# Patient Record
Sex: Female | Born: 1950 | Hispanic: Yes | Marital: Married | Smoking: Never smoker
Health system: Southern US, Community
[De-identification: ages and names within clinical notes are randomized; demographics above are authoritative.]

## PROBLEM LIST (undated history)

## (undated) DIAGNOSIS — E119 Type 2 diabetes mellitus without complications: Secondary | ICD-10-CM

## (undated) DIAGNOSIS — N186 End stage renal disease: Secondary | ICD-10-CM

## (undated) DIAGNOSIS — Z950 Presence of cardiac pacemaker: Secondary | ICD-10-CM

## (undated) DIAGNOSIS — I480 Paroxysmal atrial fibrillation: Secondary | ICD-10-CM

## (undated) DIAGNOSIS — I10 Essential (primary) hypertension: Secondary | ICD-10-CM

## (undated) DIAGNOSIS — H547 Unspecified visual loss: Secondary | ICD-10-CM

## (undated) DIAGNOSIS — D649 Anemia, unspecified: Secondary | ICD-10-CM

## (undated) DIAGNOSIS — Z992 Dependence on renal dialysis: Secondary | ICD-10-CM

## (undated) DIAGNOSIS — K7460 Unspecified cirrhosis of liver: Secondary | ICD-10-CM

## (undated) HISTORY — PX: COLONOSCOPY: SHX174

## (undated) HISTORY — DX: Anemia, unspecified: D64.9

## (undated) HISTORY — DX: Unspecified cirrhosis of liver: K74.60

## (undated) HISTORY — DX: Type 2 diabetes mellitus without complications: E11.9

## (undated) HISTORY — PX: UPPER GASTROINTESTINAL ENDOSCOPY: SHX188

## (undated) HISTORY — DX: Unspecified visual loss: H54.7

## (undated) HISTORY — PX: BACK SURGERY: SHX140

## (undated) HISTORY — DX: Dependence on renal dialysis: Z99.2

## (undated) HISTORY — DX: End stage renal disease: N18.6

---

## 2021-01-25 ENCOUNTER — Emergency Department (HOSPITAL_COMMUNITY)
Admission: EM | Admit: 2021-01-25 | Discharge: 2021-01-26 | Disposition: A | Discharge status: Home / Self Care | Payer: Medicare Other | Attending: Emergency Medicine | Admitting: Emergency Medicine

## 2021-01-25 ENCOUNTER — Encounter (HOSPITAL_COMMUNITY): Payer: Self-pay

## 2021-01-25 ENCOUNTER — Other Ambulatory Visit: Payer: Self-pay

## 2021-01-25 DIAGNOSIS — I10 Essential (primary) hypertension: Secondary | ICD-10-CM | POA: Diagnosis not present

## 2021-01-25 DIAGNOSIS — R110 Nausea: Secondary | ICD-10-CM | POA: Insufficient documentation

## 2021-01-25 DIAGNOSIS — E119 Type 2 diabetes mellitus without complications: Secondary | ICD-10-CM | POA: Insufficient documentation

## 2021-01-25 DIAGNOSIS — R1013 Epigastric pain: Secondary | ICD-10-CM | POA: Insufficient documentation

## 2021-01-25 DIAGNOSIS — Z5321 Procedure and treatment not carried out due to patient leaving prior to being seen by health care provider: Secondary | ICD-10-CM | POA: Insufficient documentation

## 2021-01-25 HISTORY — DX: Type 2 diabetes mellitus without complications: E11.9

## 2021-01-25 HISTORY — DX: Essential (primary) hypertension: I10

## 2021-01-25 LAB — COMPREHENSIVE METABOLIC PANEL
ALT: 28 U/L (ref 0–44)
AST: 70 U/L — ABNORMAL HIGH (ref 15–41)
Albumin: 3.1 g/dL — ABNORMAL LOW (ref 3.5–5.0)
Alkaline Phosphatase: 214 U/L — ABNORMAL HIGH (ref 38–126)
Anion gap: 11 (ref 5–15)
BUN: 25 mg/dL — ABNORMAL HIGH (ref 8–23)
CO2: 25 mmol/L (ref 22–32)
Calcium: 9.4 mg/dL (ref 8.9–10.3)
Chloride: 105 mmol/L (ref 98–111)
Creatinine, Ser: 1.41 mg/dL — ABNORMAL HIGH (ref 0.44–1.00)
GFR, Estimated: 41 mL/min — ABNORMAL LOW (ref >60)
Glucose, Bld: 181 mg/dL — ABNORMAL HIGH (ref 70–99)
Potassium: 4.5 mmol/L (ref 3.5–5.1)
Sodium: 141 mmol/L (ref 135–145)
Total Bilirubin: 1.6 mg/dL — ABNORMAL HIGH (ref 0.3–1.2)
Total Protein: 8.7 g/dL — ABNORMAL HIGH (ref 6.5–8.1)

## 2021-01-25 LAB — LIPASE, BLOOD: Lipase: 57 U/L — ABNORMAL HIGH (ref 11–51)

## 2021-01-25 LAB — CBC WITH DIFFERENTIAL/PLATELET
Abs Immature Granulocytes: 0.04 10*3/uL (ref 0.00–0.07)
Basophils Absolute: 0 10*3/uL (ref 0.0–0.1)
Basophils Relative: 1 %
Eosinophils Absolute: 0.1 10*3/uL (ref 0.0–0.5)
Eosinophils Relative: 2 %
HCT: 39.2 % (ref 36.0–46.0)
Hemoglobin: 12.8 g/dL (ref 12.0–15.0)
Immature Granulocytes: 1 %
Lymphocytes Relative: 18 %
Lymphs Abs: 1.1 10*3/uL (ref 0.7–4.0)
MCH: 32.7 pg (ref 26.0–34.0)
MCHC: 32.7 g/dL (ref 30.0–36.0)
MCV: 100.3 fL — ABNORMAL HIGH (ref 80.0–100.0)
Monocytes Absolute: 0.6 10*3/uL (ref 0.1–1.0)
Monocytes Relative: 10 %
Neutro Abs: 4.2 10*3/uL (ref 1.7–7.7)
Neutrophils Relative %: 68 %
Platelets: UNDETERMINED 10*3/uL (ref 150–400)
RBC: 3.91 MIL/uL (ref 3.87–5.11)
RDW: 13.6 % (ref 11.5–15.5)
WBC: 6 10*3/uL (ref 4.0–10.5)
nRBC: 0 % (ref 0.0–0.2)

## 2021-01-25 LAB — CBG MONITORING, ED: Glucose-Capillary: 215 mg/dL — ABNORMAL HIGH (ref 70–99)

## 2021-01-25 NOTE — ED Triage Notes (Signed)
Pt to er, computer interpreter used, pa at bedside, pt states that she is here because she was just recently dx with dm, and here for htn, also here for some epigastric pain that she has had for the past 5 days.

## 2021-01-25 NOTE — ED Provider Notes (Signed)
Emergency Medicine Provider Triage Evaluation Note  Stacey Chambers , a 70 y.o. female  was evaluated in triage.  Pt complains of presents with high blood pressure, diabetes, and epigastric pain.  Patient dates she saw her primary care doctor today, she was newly diagnosed with hypertension as well as diabetes, she was started on amlodipine, glipizide, she had not started them today yet.  She also notes that she is having epigastric pain, nausea going for last 5 days, has associated nausea and vomiting, no abdominal history, she denies any alleviating factors.  [name] interpreter was used to collect HPI.  Review of Systems  Positive: Epigastric pain, nausea Negative: Chest pain, shortness of breath  Physical Exam  BP (!) 197/95 (BP Location: Right Arm)   Pulse 97   Temp 98.4 F (36.9 C) (Oral)   Resp 16   SpO2 96%  Gen:   Awake, no distress   Resp:  Normal effort  MSK:   Moves extremities without difficulty  Other:  No facial asymmetry, no difficulty word finding, no slurring of the words, no unilateral weakness,  Medical Decision Making  Medically screening exam initiated at 3:10 PM.  Appropriate orders placed.  [name] was informed that the remainder of the evaluation will be completed by another provider, this initial triage assessment does not replace that evaluation, and the importance of remaining in the ED until their evaluation is complete.  Presents with high blood pressure, diabetes, epigastric pain patiently further work-up in the emergency department.   Marcello Fennel, PA-C 01/25/21 1512    Malvin Johns, MD 01/25/21 1515

## 2021-01-26 ENCOUNTER — Telehealth: Payer: Self-pay | Admitting: Gastroenterology

## 2021-01-26 ENCOUNTER — Encounter: Payer: Self-pay | Admitting: Gastroenterology

## 2021-01-26 NOTE — Telephone Encounter (Signed)
Hi Dr. Rush Landmark, we have received a referral from patient's PCP for worsening Gerd and epigastric pain. Patient was seen at [facility] last August 2021 and had EGD, colon and other exams performed. Records are available in Epic. I will send you records sent with referral. Could you please review them and advise on scheduling? Thank you.

## 2021-01-26 NOTE — Telephone Encounter (Signed)
Further workup/evaluation will be required by PCP in interim. May schedule in 4-8 weeks but do not overbook. If imaging is performed and more concerning then can see if anything else needs to be done sooner, but that is deferred to PCP in the interim. Thanks. GM

## 2021-01-27 ENCOUNTER — Other Ambulatory Visit: Payer: Self-pay | Admitting: Physician Assistant

## 2021-01-27 DIAGNOSIS — Z1231 Encounter for screening mammogram for malignant neoplasm of breast: Secondary | ICD-10-CM

## 2021-01-27 NOTE — Telephone Encounter (Signed)
Pt scheduled for consult on 03/11/21 at 10:30am. PCP has been informed of Dr. Donneta Romberg recommendations.

## 2021-01-31 DIAGNOSIS — R1013 Epigastric pain: Secondary | ICD-10-CM | POA: Diagnosis not present

## 2021-01-31 DIAGNOSIS — R7989 Other specified abnormal findings of blood chemistry: Secondary | ICD-10-CM | POA: Diagnosis not present

## 2021-01-31 DIAGNOSIS — E1165 Type 2 diabetes mellitus with hyperglycemia: Secondary | ICD-10-CM | POA: Diagnosis not present

## 2021-01-31 DIAGNOSIS — R3129 Other microscopic hematuria: Secondary | ICD-10-CM | POA: Diagnosis not present

## 2021-01-31 DIAGNOSIS — N289 Disorder of kidney and ureter, unspecified: Secondary | ICD-10-CM | POA: Diagnosis not present

## 2021-01-31 DIAGNOSIS — Z0001 Encounter for general adult medical examination with abnormal findings: Secondary | ICD-10-CM | POA: Diagnosis not present

## 2021-01-31 DIAGNOSIS — E782 Mixed hyperlipidemia: Secondary | ICD-10-CM | POA: Diagnosis not present

## 2021-01-31 DIAGNOSIS — R771 Abnormality of globulin: Secondary | ICD-10-CM | POA: Diagnosis not present

## 2021-01-31 DIAGNOSIS — D696 Thrombocytopenia, unspecified: Secondary | ICD-10-CM | POA: Diagnosis not present

## 2021-01-31 DIAGNOSIS — I10 Essential (primary) hypertension: Secondary | ICD-10-CM | POA: Diagnosis not present

## 2021-02-02 ENCOUNTER — Telehealth: Payer: Self-pay | Admitting: Hematology and Oncology

## 2021-02-02 NOTE — Telephone Encounter (Signed)
Received a new hem referral from [facility] for thrombocytopenia. Stacey Chambers has been scheduled to see Dr. Chryl Heck on 8/22 at 11:20am. Letter mailed to the pt. Referring office notified to call the pt w/the appt date and time.

## 2021-02-21 ENCOUNTER — Inpatient Hospital Stay: Payer: Medicare Other

## 2021-02-21 ENCOUNTER — Inpatient Hospital Stay: Payer: Medicare Other | Attending: Hematology and Oncology | Admitting: Hematology and Oncology

## 2021-02-28 DIAGNOSIS — D696 Thrombocytopenia, unspecified: Secondary | ICD-10-CM | POA: Diagnosis not present

## 2021-02-28 DIAGNOSIS — E782 Mixed hyperlipidemia: Secondary | ICD-10-CM | POA: Diagnosis not present

## 2021-02-28 DIAGNOSIS — M62838 Other muscle spasm: Secondary | ICD-10-CM | POA: Diagnosis not present

## 2021-02-28 DIAGNOSIS — R7989 Other specified abnormal findings of blood chemistry: Secondary | ICD-10-CM | POA: Diagnosis not present

## 2021-02-28 DIAGNOSIS — N289 Disorder of kidney and ureter, unspecified: Secondary | ICD-10-CM | POA: Diagnosis not present

## 2021-02-28 DIAGNOSIS — E1165 Type 2 diabetes mellitus with hyperglycemia: Secondary | ICD-10-CM | POA: Diagnosis not present

## 2021-02-28 DIAGNOSIS — R3129 Other microscopic hematuria: Secondary | ICD-10-CM | POA: Diagnosis not present

## 2021-02-28 DIAGNOSIS — R771 Abnormality of globulin: Secondary | ICD-10-CM | POA: Diagnosis not present

## 2021-02-28 DIAGNOSIS — I10 Essential (primary) hypertension: Secondary | ICD-10-CM | POA: Diagnosis not present

## 2021-02-28 DIAGNOSIS — R1013 Epigastric pain: Secondary | ICD-10-CM | POA: Diagnosis not present

## 2021-03-10 DIAGNOSIS — H1712 Central corneal opacity, left eye: Secondary | ICD-10-CM | POA: Diagnosis not present

## 2021-03-11 ENCOUNTER — Ambulatory Visit (INDEPENDENT_AMBULATORY_CARE_PROVIDER_SITE_OTHER): Payer: Medicare Other | Admitting: Gastroenterology

## 2021-03-11 ENCOUNTER — Encounter: Payer: Self-pay | Admitting: Gastroenterology

## 2021-03-11 ENCOUNTER — Other Ambulatory Visit (INDEPENDENT_AMBULATORY_CARE_PROVIDER_SITE_OTHER): Payer: Medicare Other

## 2021-03-11 VITALS — BP 174/70 | HR 84 | Ht <= 58 in | Wt 149.2 lb

## 2021-03-11 DIAGNOSIS — R748 Abnormal levels of other serum enzymes: Secondary | ICD-10-CM

## 2021-03-11 DIAGNOSIS — D7589 Other specified diseases of blood and blood-forming organs: Secondary | ICD-10-CM

## 2021-03-11 DIAGNOSIS — R7989 Other specified abnormal findings of blood chemistry: Secondary | ICD-10-CM | POA: Diagnosis not present

## 2021-03-11 DIAGNOSIS — Z862 Personal history of diseases of the blood and blood-forming organs and certain disorders involving the immune mechanism: Secondary | ICD-10-CM

## 2021-03-11 DIAGNOSIS — Z8711 Personal history of peptic ulcer disease: Secondary | ICD-10-CM | POA: Diagnosis not present

## 2021-03-11 DIAGNOSIS — R1013 Epigastric pain: Secondary | ICD-10-CM | POA: Diagnosis not present

## 2021-03-11 LAB — COMPREHENSIVE METABOLIC PANEL
ALT: 28 U/L (ref 0–35)
AST: 65 U/L — ABNORMAL HIGH (ref 0–37)
Albumin: 3.3 g/dL — ABNORMAL LOW (ref 3.5–5.2)
Alkaline Phosphatase: 175 U/L — ABNORMAL HIGH (ref 39–117)
BUN: 35 mg/dL — ABNORMAL HIGH (ref 6–23)
CO2: 23 mEq/L (ref 19–32)
Calcium: 9.1 mg/dL (ref 8.4–10.5)
Chloride: 105 mEq/L (ref 96–112)
Creatinine, Ser: 1.69 mg/dL — ABNORMAL HIGH (ref 0.40–1.20)
GFR: 31 mL/min — ABNORMAL LOW (ref >60.00)
Glucose, Bld: 135 mg/dL — ABNORMAL HIGH (ref 70–99)
Potassium: 4.7 mEq/L (ref 3.5–5.1)
Sodium: 136 mEq/L (ref 135–145)
Total Bilirubin: 0.6 mg/dL (ref 0.2–1.2)
Total Protein: 8.4 g/dL — ABNORMAL HIGH (ref 6.0–8.3)

## 2021-03-11 LAB — IBC + FERRITIN
Ferritin: 23.1 ng/mL (ref 10.0–291.0)
Iron: 61 ug/dL (ref 42–145)
Saturation Ratios: 13.5 % — ABNORMAL LOW (ref 20.0–50.0)
TIBC: 450.8 ug/dL — ABNORMAL HIGH (ref 250.0–450.0)
Transferrin: 322 mg/dL (ref 212.0–360.0)

## 2021-03-11 LAB — CBC
HCT: 37.5 % (ref 36.0–46.0)
Hemoglobin: 12.5 g/dL (ref 12.0–15.0)
MCHC: 33.3 g/dL (ref 30.0–36.0)
MCV: 98.2 fl (ref 78.0–100.0)
Platelets: 67 10*3/uL — ABNORMAL LOW (ref 150.0–400.0)
RBC: 3.82 Mil/uL — ABNORMAL LOW (ref 3.87–5.11)
RDW: 13.4 % (ref 11.5–15.5)
WBC: 6 10*3/uL (ref 4.0–10.5)

## 2021-03-11 LAB — B12 AND FOLATE PANEL
Folate: >24.4 ng/mL (ref >5.9)
Vitamin B-12: 924 pg/mL — ABNORMAL HIGH (ref 211–911)

## 2021-03-11 LAB — LIPASE: Lipase: 122 U/L — ABNORMAL HIGH (ref 11.0–59.0)

## 2021-03-11 MED ORDER — ESOMEPRAZOLE MAGNESIUM 40 MG PO CPDR: 40.0000 mg | DELAYED_RELEASE_CAPSULE | Freq: Every day | ORAL | 2 refills | Status: DC

## 2021-03-11 NOTE — Patient Instructions (Addendum)
Su proveedor le ha pedido que vaya al nivel del stano para el trabajo de laboratorio antes de salir hoy. Presione "B" en el ascensor. El laboratorio est ubicado en la primera puerta a la izquierda al salir del Materials engineer.  Le han programado una endoscopia. Siga las instrucciones escritas que se le dieron en su visita de hoy. Si [place] inhaladores (aunque solo sea necesario), trigalos el da de su procedimiento.  Hemos enviado los siguientes medicamentos a su farmacia para que los recoja a su conveniencia: Nexium: tome 1 cpsula por va oral una vez al [employer].  Se le ha programado una ecografa abdominal en [facility] (primer piso del hospital) el 03/25/21 a las 9:30am. Llegue 15 minutos antes de su cita para registrarse. Asegrese de no comer ni beber nada 6 horas antes de su cita. Si necesita reprogramar su cita, comunquese con radiologa al [phone]. Esta prueba suele tardar unos 30 minutos en realizarse.  Debido a los cambios recientes en las leyes de atencin mdica, es posible que vea los [place] de sus estudios de imgenes y de laboratorio en MyChart antes de que su proveedor haya tenido la oportunidad de revisarlos. Entendemos que en algunos casos puede haber resultados confusos o preocupantes para usted. No todos los resultados de laboratorio regresan en el mismo perodo de tiempo y el proveedor puede estar esperando mltiples resultados para Primary school teacher. Denos 21 horas para que su proveedor revise minuciosamente todos los resultados antes de comunicarse con la oficina para Audiological scientist.  Si tiene 65 aos o ms, su ndice de [employer] corporal debe estar entre 23 y 7. Su ndice de masa corporal es de 31,18 kg/m. Si esto est fuera del rango mencionado anteriormente, considere hacer un seguimiento con su proveedor de Midwife.  Los proveedores de Financial controller GI desean alentarlo a que use MYCHART para comunicarse con los proveedores para solicitudes o preguntas que  no sean urgentes. Debido a los Astronomer de espera en el telfono, enviar un mensaje a su proveedor por [employer] puede ser una forma ms rpida y eficiente de obtener una respuesta. Espere 48 horas hbiles para obtener [employer]. Recuerde que esto es para solicitudes no urgentes.  Gracias por elegirnos a m Com[facility] Gastro[facility].  ________________________________________________________________   Your provider has requested that you go to the basement level for lab work before leaving today. Press "B" on the elevator. The lab is located at the first door on the left as you exit the elevator.  You have been scheduled for an endoscopy. Please follow written instructions given to you at your visit today. If you use inhalers (even only as needed), please bring them with you on the day of your procedure.  We have sent the following medications to your pharmacy for you to pick up at your convenience: Nexium - Take 1 capsule by mouth once daily.   You have been scheduled for an abdominal ultrasound at [facility] (1st floor of hospital) on 03/25/21 at 9:30am. Please arrive 15 minutes prior to your appointment for registration. Make certain not to have anything to eat or drink 6 hours prior to your appointment. Should you need to reschedule your appointment, please contact radiology at [phone]. This test typically takes about 30 minutes to perform.  Due to recent changes in healthcare laws, you may see the results of your imaging and laboratory studies on MyChart before your provider has had a chance to review them.  We understand that in some cases there  may be results that are confusing or concerning to you. Not all laboratory results come back in the same time frame and the provider may be waiting for multiple results in order to interpret others.  Please give [place] 48 hours in order for your provider to thoroughly review all the results before contacting the office for  clarification of your results.   If you are age 73 or older, your body mass index should be between 23-30. Your Body mass index is 31.18 kg/m. If this is out of the aforementioned range listed, please consider follow up with your Primary Care Provider.  The [facility] providers would like to encourage you to use [facility] to communicate with providers for non-urgent requests or questions.  Due to long hold times on the telephone, sending your provider a message by [facility] may be a faster and more efficient way to get a response.  Please allow 48 business hours for a response.  Please remember that this is for non-urgent requests.   Thank you for choosing me and [facility].  Dr. Rush Landmark

## 2021-03-11 NOTE — Progress Notes (Signed)
[place] VISIT   Primary Care Provider Trey Sailors, [place] [address] [phone]  Referring Provider Trey Sailors, [place] [address] [place],  [place] [address] [phone]  Patient Profile: Stacey Chambers is a 70 y.o. female with a pmh significant for diabetes, hypertension, hyperlipidemia, chronic back pain, PUD (manifested as GU), diverticulosis, hemorrhoids.  The patient presents to the [facility] for an evaluation and management of problem(s) noted below:  Problem List 1. Abdominal pain, epigastric   2. History of gastric ulcer   3. Elevated lipase   4. Elevated LFTs   5. History of thrombocytopenia   6. History of iron deficiency anemia   7. Macrocytosis     History of Present Illness This is the patient's first visit to outpatient [place] clinic.  Her evaluation is done with interpreter services available.  Patient presents approximately 2 to 3 months abdominal pain.  She describes the discomfort as burning and sharp at times.  Interestingly, she feels some improvement of the pain after eating but this is not always occurring.  She denies any nausea or weight has been stable over the course of the last few months and she has actually been gaining weight past year.  She has between 1 and 2 bowel movements daily any blood or mucus.  The patient drinks at least 1-2 beers on a daily basis.  She has chronic back pain issues for which she takes pain medications but also at times will use THC infused tea.  The patient has a history of endoscopic evaluation in 2021 while admitted to the hospital in [facility] for evaluation of anemia while she was hospitalized for back surgery.  She was found to have a gastric ulcer as well as gastritis and diverticulosis.  When the patient last had laboratories in our system, or Care Everywhere, she had evidence of a macrocytosis but also prior history of  iron deficiency.  We have obtain her outpatient PCP laboratories and they are outlined below.  She is not sure what is causing her pain or discomfort she is worried about whether there could be any evidence of cancer.  Patient does not NSAIDs or BC/Goody powders.  She does describe taking the medication while she was admitted to the hospital but cannot recall any antiacid medications after she was discharged from the hospital.  GI Review of Systems Positive as above including infrequent pyrosis Negative for dysphagia, odynophagia, alteration of bowel habits  Review of Systems General: Denies fevers/chills/weight loss unintentionally HEENT: Denies oral lesions/sore throat Cardiovascular: Denies chest pain/palpitations Pulmonary: Denies shortness of breath Gastroenterological: See HPI Genitourinary: Denies darkened urine or hematuria Hematological: Denies easy bruising/bleeding Endocrine: Denies temperature intolerance Dermatological: Denies jaundice Psychological: Mood is stable but her chronic back pain issues because her significant concern   Medications Current Outpatient Medications  Medication Sig Dispense Refill   amLODipine (NORVASC) 10 MG tablet Take 10 mg by mouth daily.     esomeprazole (NEXIUM) 40 MG capsule Take 1 capsule (40 mg total) by mouth daily. 30 capsule 2   glipiZIDE (GLUCOTROL) 5 MG tablet Take 5 mg by mouth daily.     tiZANidine (ZANAFLEX) 4 MG tablet Take 4 mg by mouth 3 (three) times daily.     traMADol (ULTRAM) 50 MG tablet Take 50 mg by mouth 2 (two) times daily as needed.     No current facility-administered medications for this visit.    Allergies No Known Allergies  Histories Past Medical History:  Diagnosis Date   Diabetes mellitus without complication ([place])    Hypertension    Past Surgical History:  Procedure Laterality Date   BACK SURGERY     COLONOSCOPY     UPPER GASTROINTESTINAL ENDOSCOPY     Social History   Socioeconomic History    Marital status: Married    Spouse name: Not on file   Number of children: Not on file   Years of education: Not on file   Highest education level: Not on file  Occupational History   Not on file  Tobacco Use   Smoking status: Never   Smokeless tobacco: Never  Vaping Use   Vaping Use: Never used  Substance and Sexual Activity   Alcohol use: Never   Drug use: Yes    Types: Marijuana    Comment: Use of THC tea 1-2 times per week   Sexual activity: Not on file  Other Topics Concern   Not on file  Social History Narrative   Not on file   Social Determinants of Health   Financial Resource Strain: Not on file  Food Insecurity: Not on file  Transportation Needs: Not on file  Physical Activity: Not on file  Stress: Not on file  Social Connections: Not on file  Intimate Partner Violence: Not on file   Family History  Problem Relation Age of Onset   Diabetes Brother    Colon cancer Neg Hx    Esophageal cancer Neg Hx    Stomach cancer Neg Hx    Pancreatic cancer Neg Hx    Inflammatory bowel disease Neg Hx    Liver disease Neg Hx    Rectal cancer Neg Hx    I have reviewed her medical, social, and family history in detail and updated the electronic medical record as necessary.    PHYSICAL EXAMINATION  BP (!) 174/70   Pulse 84   Ht '4\' 10"'  (1.473 m)   Wt 149 lb 3.2 oz (67.7 kg)   BMI 31.18 kg/m  Wt Readings from Last 3 Encounters:  03/11/21 149 lb 3.2 oz (67.7 kg)  01/25/21 154 lb (69.9 kg)  GEN: NAD, appears stated age, doesn't appear chronically ill, PSYCH: Cooperative, without pressured speech EYE: Conjunctivae pink, sclerae anicteric ENT: MMM, without oral ulcers, no erythema or exudates noted NECK: Supple CV: RR without R/Gs  RESP: CTAB posteriorly, without wheezing GI: NABS, soft, NT/ND, without rebound or guarding, no HSM appreciated MSK/EXT: No lower extremity edema SKIN: No jaundice, no spider angiomata NEURO:  Alert & Oriented x 3, no focal deficits, no  evidence of asterixis   REVIEW OF DATA  I reviewed the following data at the time of this encounter:  GI Procedures and Studies  August 2021 EGD outside Findings:       The examined esophagus was normal.       The Z-line was regular and was found 38 cm from the incisors.       One non-bleeding cratered gastric ulcer with no stigmata of bleeding was       found in the gastric antrum. The lesion was 3 mm in largest  dimension.       Biopsies were taken with a cold forceps for histology.       Localized moderate inflammation characterized by erythema  and friability       was found in the gastric antrum in a linear pattern. Biopsies  were taken       with  a cold forceps for Helicobacter pylori testing.       The cardia and gastric fundus were normal on retroflexion.       The examined duodenum was normal.  August 2021 colonoscopy Findings:       The perianal and digital rectal examinations were normal.       The terminal ileum appeared normal.       Normal mucosa was found in the entire colon.       Multiple small-mouthed diverticula were found in the sigmoid  colon.       Internal hemorrhoids were found during retroflexion. The  hemorrhoids       were large.   Laboratory Studies  Reviewed those in epic and care everywhere  Outside labs reviewed July 2022 SPEP Albumin 3.3 (diminished) Alpha-1 globulin 0.3 Alpha-2 globulin 0.7 Gammaglobulin 2.6 (elevated) Abnormal protein band 1 negative Beta 1 globulin 0.5 Beta-2 globulin 0.5 Hepatitis A IgM negative Hepatitis B core antibody IgM negative Hepatitis B surface antigen negative Hepatitis C antibody negative Sodium 135 Potassium 4.3 BUN/creatinine 23/1.43 WBC 5.4 Hemoglobin/hematocrit 12.2/37.7 Platelets 80 MCV 99.0 Total cholesterol 170 LDL 100 HDL 40 Triglycerides 180 AST/ALT 87/27 Alk phos 317 Total bili 1.3 TSH 4.17 Hemoglobin A1c 7.2  Imaging Studies  No relevant studies to review in epic or Care  Everywhere   ASSESSMENT  Ms. Mctier is a 70 y.o. female with a pmh significant for diabetes, hypertension, hyperlipidemia, chronic back pain, PUD (manifested as GU), diverticulosis, hemorrhoids.  The patient is seen today for evaluation and management of:  1. Abdominal pain, epigastric   2. History of gastric ulcer   3. Elevated lipase   4. Elevated LFTs   5. History of thrombocytopenia   6. History of iron deficiency anemia   7. Macrocytosis    Patient is hemodynamically stable.  Clinically however sounds like mild dyspepsia at times but not completely correlative.  She has had an elevated lipase in the past though did not meet criteria for pancreatitis.  She has a history of prior gastric ulcer and its not clear if she ever had complete therapy for this and has not had a follow-up endoscopy to ensure resolution based on her history.  We will begin her work-up with laboratories, abdominal ultrasound imaging, and endoscopic evaluation.  Pending the completion of this she may require cross-sectional imaging.  We will initiate a PPI for now given makes a difference for her.  Minimization of all alcohol consumption to evaluate liver tests and consider additional work-up of liver tests will be based on numbers look.  The risks and benefits of endoscopic evaluation were discussed with the patient; these include but are not limited to the risk of perforation, infection, bleeding, missed lesions, lack of diagnosis, severe illness requiring hospitalization, as well as anesthesia and sedation related illnesses.  The patient and/or family is agreeable to proceed.  All patient questions were answered to the best of my ability, and the patient agrees to the aforementioned plan of action with follow-up as indicated.   PLAN  Laboratories as outlined below Abdominal ultrasound to be scheduled Begin Nexium 40 mg daily Proceed with scheduling diagnostic endoscopy Holding of alcohol consumption at this time  while evaluating her LFTs Further serological work-up to be considered based on trend of LFTs Patient could need cross-sectional imaging depending on findings of above   Orders Placed This Encounter  Procedures   US Abdomen Complete   CBC   Comp Met (CMET)   Lipase  IBC + Ferritin   Tissue transglutaminase, IgA   Hepatitis A antibody, total   Hepatitis B Surface AntiBODY   Hepatitis B Core Antibody, total   B12 and Folate Panel   IgA   Ambulatory referral to Gastroenterology    New Prescriptions   ESOMEPRAZOLE (NEXIUM) 40 MG CAPSULE    Take 1 capsule (40 mg total) by mouth daily.   Modified Medications   No medications on file    Planned Follow Up No follow-ups on file.   Total Time in Face-to-Face and in Coordination of Care for patient including independent/personal interpretation/review of prior testing, medical history, examination, medication adjustment, communicating results with the patient directly, and documentation with the EHR is 45 minutes.   Justice Britain, MD [facility] Advanced Endoscopy Office # [phone]

## 2021-03-13 ENCOUNTER — Encounter: Payer: Self-pay | Admitting: Gastroenterology

## 2021-03-14 ENCOUNTER — Encounter: Payer: Self-pay | Admitting: Gastroenterology

## 2021-03-14 DIAGNOSIS — R7989 Other specified abnormal findings of blood chemistry: Secondary | ICD-10-CM | POA: Insufficient documentation

## 2021-03-14 DIAGNOSIS — R748 Abnormal levels of other serum enzymes: Secondary | ICD-10-CM | POA: Insufficient documentation

## 2021-03-14 DIAGNOSIS — R1013 Epigastric pain: Secondary | ICD-10-CM | POA: Insufficient documentation

## 2021-03-14 DIAGNOSIS — Z8711 Personal history of peptic ulcer disease: Secondary | ICD-10-CM | POA: Insufficient documentation

## 2021-03-14 DIAGNOSIS — Z862 Personal history of diseases of the blood and blood-forming organs and certain disorders involving the immune mechanism: Secondary | ICD-10-CM | POA: Insufficient documentation

## 2021-03-14 DIAGNOSIS — D7589 Other specified diseases of blood and blood-forming organs: Secondary | ICD-10-CM | POA: Insufficient documentation

## 2021-03-15 LAB — HEPATITIS B CORE ANTIBODY, TOTAL: Hep B Core Total Ab: NONREACTIVE

## 2021-03-15 LAB — HEPATITIS A ANTIBODY, TOTAL: Hepatitis A AB,Total: REACTIVE — AB

## 2021-03-15 LAB — IGA: Immunoglobulin A: 602 mg/dL — ABNORMAL HIGH (ref 70–320)

## 2021-03-15 LAB — TISSUE TRANSGLUTAMINASE, IGA: (tTG) Ab, IgA: <1 U/mL

## 2021-03-15 LAB — HEPATITIS B SURFACE ANTIBODY,QUALITATIVE: Hep B S Ab: BORDERLINE — AB

## 2021-03-17 ENCOUNTER — Other Ambulatory Visit: Payer: Self-pay

## 2021-03-17 ENCOUNTER — Ambulatory Visit (AMBULATORY_SURGERY_CENTER): Payer: Medicare Other | Admitting: Gastroenterology

## 2021-03-17 ENCOUNTER — Encounter: Payer: Self-pay | Admitting: Gastroenterology

## 2021-03-17 VITALS — BP 174/87 | HR 78 | Temp 98.1°F | Resp 17 | Ht <= 58 in | Wt 149.0 lb

## 2021-03-17 DIAGNOSIS — Z8711 Personal history of peptic ulcer disease: Secondary | ICD-10-CM

## 2021-03-17 DIAGNOSIS — E119 Type 2 diabetes mellitus without complications: Secondary | ICD-10-CM | POA: Diagnosis not present

## 2021-03-17 DIAGNOSIS — K2970 Gastritis, unspecified, without bleeding: Secondary | ICD-10-CM

## 2021-03-17 DIAGNOSIS — R1013 Epigastric pain: Secondary | ICD-10-CM

## 2021-03-17 DIAGNOSIS — K449 Diaphragmatic hernia without obstruction or gangrene: Secondary | ICD-10-CM | POA: Diagnosis not present

## 2021-03-17 DIAGNOSIS — K259 Gastric ulcer, unspecified as acute or chronic, without hemorrhage or perforation: Secondary | ICD-10-CM

## 2021-03-17 DIAGNOSIS — K31A0 Gastric intestinal metaplasia, unspecified: Secondary | ICD-10-CM | POA: Diagnosis not present

## 2021-03-17 DIAGNOSIS — K2980 Duodenitis without bleeding: Secondary | ICD-10-CM | POA: Diagnosis not present

## 2021-03-17 DIAGNOSIS — I10 Essential (primary) hypertension: Secondary | ICD-10-CM | POA: Diagnosis not present

## 2021-03-17 DIAGNOSIS — K219 Gastro-esophageal reflux disease without esophagitis: Secondary | ICD-10-CM | POA: Diagnosis not present

## 2021-03-17 MED ORDER — SODIUM CHLORIDE 0.9 % IV SOLN: 500.0000 mL | Freq: Once | INTRAVENOUS | Status: DC

## 2021-03-17 MED ORDER — ESOMEPRAZOLE MAGNESIUM 40 MG PO CPDR: 40.0000 mg | DELAYED_RELEASE_CAPSULE | Freq: Every morning | ORAL | 2 refills | Status: DC

## 2021-03-17 NOTE — Patient Instructions (Signed)
Resume previous diet Start taking nexium '40mg'$  daily  Noaspirin , ibuprofen, naproxen  Repeat egd in 4 months  USTED TUVO UN PROCEDIMIENTO ENDOSCPICO HOY EN EL [facility]:   Lea el informe del procedimiento que se le entreg para cualquier pregunta especfica sobre lo que se Primary school teacher.  Si el informe del examen no responde a sus preguntas, por favor llame a su gastroenterlogo para aclararlo.  Si usted solicit que no se le den [employer] de lo que se Estate manager/land agent en su procedimiento al [employer] va a cuidar, entonces el informe del procedimiento se ha incluido en un sobre sellado para que usted lo revise despus cuando le sea ms conveniente.   LO QUE PUEDE ESPERAR: Algunas sensaciones de hinchazn en el abdomen.  Puede tener ms gases de lo normal.  El caminar puede ayudarle a eliminar el aire que se le puso en el tracto gastrointestinal durante el procedimiento y reducir la hinchazn.  Si le hicieron una endoscopia inferior (como una colonoscopia o una sigmoidoscopia flexible), podra notar manchas de sangre en las heces fecales o en el papel higinico.  Si se someti a una preparacin intestinal para su procedimiento, es posible que no tenga una evacuacin intestinal normal durante [employer].   Tenga en cuenta:  Es posible que note un poco de irritacin y congestin en la nariz o algn drenaje.  Esto es debido al oxgeno [employer] durante su procedimiento.  No hay que preocuparse y esto debe desaparecer ms o Scientist, research (medical).   SNTOMAS PARA REPORTAR INMEDIATAMENTE:  Despus de una endoscopia inferior (colonoscopia o sigmoidoscopia flexible):  Cantidades excesivas de sangre en las heces fecales  Sensibilidad significativa o empeoramiento de los dolores abdominales   Hinchazn aguda del abdomen que antes no tena   Fiebre de 100F o ms   Despus de la endoscopia superior (EGD)  Vmitos de Biochemist, clinical o material como caf molido   Dolor en el pecho o dolor  debajo de los omplatos que antes no tena   Dolor o dificultad persistente para tragar  Falta de aire que antes no tena   Fiebre de 100F o ms  Heces fecales negras y pegajosas   Para asuntos urgentes o de Freight forwarder, puede comunicarse con un gastroenterlogo a cualquier hora llamando al [phone].  DIETA:  Recomendamos una comida pequea al principio, pero luego puede continuar con su dieta normal.  Tome muchos lquidos, Teacher, adult education las bebidas alcohlicas durante 24 horas.    ACTIVIDAD:  Debe planear tomarse las cosas con calma por el resto del da y no debe CONDUCIR ni usar maquinaria pesada Programmer, applications (debido a los medicamentos de sedacin utilizados durante el examen).     SEGUIMIENTO: Nuestro personal llamar al nmero que aparece en su historial al siguiente da hbil de su procedimiento para ver cmo se siente y para responder cualquier pregunta o inquietud que pueda tener con respecto a la informacin que se le dio despus del procedimiento. Si no podemos contactarle, le dejaremos un mensaje.  Sin embargo, si se siente bien y no tiene Paediatric nurse, no es necesario que nos devuelva la llamada.  Asumiremos que ha regresado a sus actividades diarias normales sin incidentes. Si se le tomaron algunas biopsias, le contactaremos por telfono o por carta en las prximas 3 semanas.  Si no ha sabido [employer] biopsias en el transcurso de 3 semanas, por favor llmenos al [phone].   FIRMAS/CONFIDENCIALIDAD: Waldron Session y/o el acompaante  que le cuide han firmado documentos que se ingresarn en su historial mdico electrnico.  Estas firmas atestiguan el hecho de que la informacin anterior

## 2021-03-17 NOTE — Progress Notes (Signed)
VS by CW    Interpreter: Lelan Pons   Pt's states no medical or surgical changes since previsit or office visit.

## 2021-03-17 NOTE — Progress Notes (Signed)
Pts iv site was pink and swollen in a 6 cm area around her IV insertion upon arrival to recovery.  IV was stopped, and removed and warm pack was applied.

## 2021-03-17 NOTE — Op Note (Signed)
[facility] Patient Name: Stacey Chambers Procedure Date: 03/17/2021 8:18 AM MRN: [id number] Endoscopist: Gabriel Mansouraty , MD Age: 69 Referring MD:  Date of Birth: 11/24/1950 Gender: Female Account #: [id number] Procedure:                Upper GI endoscopy Indications:              Epigastric abdominal pain, Heartburn, Follow-up of                            gastric ulcer Medicines:                Monitored Anesthesia Care Procedure:                Pre-Anesthesia Assessment:                           - Prior to the procedure, a History and Physical                            was performed, and patient medications and                            allergies were reviewed. The patient's tolerance of                            previous anesthesia was also reviewed. The risks                            and benefits of the procedure and the sedation                            options and risks were discussed with the patient.                            All questions were answered, and informed consent                            was obtained. Prior Anticoagulants: The patient has                            taken no previous anticoagulant or antiplatelet                            agents. ASA Grade Assessment: II - A patient with                            mild systemic disease. After reviewing the risks                            and benefits, the patient was deemed in                            satisfactory condition to undergo the procedure.                             After obtaining informed consent, the endoscope was                            passed under direct vision. Throughout the                            procedure, the patient's blood pressure, pulse, and                            oxygen saturations were monitored continuously. The                            Endoscope was introduced through the mouth, and                            advanced to the second part of  duodenum. The upper                            GI endoscopy was accomplished without difficulty.                            The patient tolerated the procedure. Scope In: Scope Out: Findings:                 No gross lesions were noted in the entire                            esophagus. Biopsies were taken with a cold forceps                            for histology.                           The Z-line was irregular and was found 39 cm from                            the incisors.                           A 1 cm hiatal hernia was present.                           One non-bleeding superficial gastric ulcer with a                            clean ulcer base (Forrest Class III) was found in                            the gastric antrum. The lesion was 7 mm in largest                            dimension.                           Segmental moderate inflammation characterized by  erosions, erythema and granularity was found in the                            entire examined stomach. Biopsies were taken with a                            cold forceps for histology and Helicobacter pylori                            testing.                           Patchy mildly erythematous mucosa without active                            bleeding and with no stigmata of bleeding was found                            in the duodenal bulb, in the first portion of the                            duodenum and in the second portion of the duodenum.                            Biopsies for histology were taken with a cold                            forceps for evaluation of celiac disease and HP                            evaluation. Complications:            No immediate complications. Estimated Blood Loss:     Estimated blood loss was minimal. Impression:               - No gross lesions in esophagus. Biopsied.                           - Z-line irregular, 39 cm from the incisors.                            - 1 cm hiatal hernia.                           - Non-bleeding gastric ulcer with a clean ulcer                            base (Forrest Class III) in antrum. Gastritis                            throughout - biopsied.                           - Erythematous duodenopathy. Biopsied. Recommendation:           - The   patient will be observed post-procedure,                            until all discharge criteria are met.                           - Discharge patient to home.                           - Patient has a contact number available for                            emergencies. The signs and symptoms of potential                            delayed complications were discussed with the                            patient. Return to normal activities tomorrow.                            Written discharge instructions were provided to the                            patient.                           - Resume previous diet.                           - Continue present medications.                           - Please start taking Nexium 40 mg daily.                           - Await pathology results.                           - No aspirin, ibuprofen, naproxen, or other                            non-steroidal anti-inflammatory drugs.                           - Await pathology results.                           - Repeat upper endoscopy in 4 months to check                            healing.                           - The findings and recommendations were discussed                              with the patient. Justice Britain, MD 03/17/2021 8:59:44 AM

## 2021-03-17 NOTE — Progress Notes (Signed)
GASTROENTEROLOGY PROCEDURE H&P NOTE   Primary Care Physician: Trey Sailors, PA  HPI: Stacey Chambers is a 70 y.o. female who presents for EGD for evaluation of abdominal pain and history of prior gastric ulcers and GERD.  Past Medical History:  Diagnosis Date   Diabetes mellitus without complication ([place])    Hypertension    Past Surgical History:  Procedure Laterality Date   BACK SURGERY     COLONOSCOPY     UPPER GASTROINTESTINAL ENDOSCOPY     Current Outpatient Medications  Medication Sig Dispense Refill   amLODipine (NORVASC) 10 MG tablet Take 10 mg by mouth daily.     esomeprazole (NEXIUM) 40 MG capsule Take 1 capsule (40 mg total) by mouth daily. 30 capsule 2   glipiZIDE (GLUCOTROL) 5 MG tablet Take 5 mg by mouth daily.     tiZANidine (ZANAFLEX) 4 MG tablet Take 4 mg by mouth 3 (three) times daily.     traMADol (ULTRAM) 50 MG tablet Take 50 mg by mouth 2 (two) times daily as needed.     No current facility-administered medications for this visit.    Current Outpatient Medications:    amLODipine (NORVASC) 10 MG tablet, Take 10 mg by mouth daily., Disp: , Rfl:    esomeprazole (NEXIUM) 40 MG capsule, Take 1 capsule (40 mg total) by mouth daily., Disp: 30 capsule, Rfl: 2   glipiZIDE (GLUCOTROL) 5 MG tablet, Take 5 mg by mouth daily., Disp: , Rfl:    tiZANidine (ZANAFLEX) 4 MG tablet, Take 4 mg by mouth 3 (three) times daily., Disp: , Rfl:    traMADol (ULTRAM) 50 MG tablet, Take 50 mg by mouth 2 (two) times daily as needed., Disp: , Rfl:  No Known Allergies Family History  Problem Relation Age of Onset   Diabetes Brother    Colon cancer Neg Hx    Esophageal cancer Neg Hx    Stomach cancer Neg Hx    Pancreatic cancer Neg Hx    Inflammatory bowel disease Neg Hx    Liver disease Neg Hx    Rectal cancer Neg Hx    Social History   Socioeconomic History   Marital status: Married    Spouse name: Not on file   Number of children: Not on file   Years of  education: Not on file   Highest education level: Not on file  Occupational History   Not on file  Tobacco Use   Smoking status: Never   Smokeless tobacco: Never  Vaping Use   Vaping Use: Never used  Substance and Sexual Activity   Alcohol use: Never   Drug use: Yes    Types: Marijuana    Comment: Use of THC tea 1-2 times per week   Sexual activity: Not on file  Other Topics Concern   Not on file  Social History Narrative   Not on file   Social Determinants of Health   Financial Resource Strain: Not on file  Food Insecurity: Not on file  Transportation Needs: Not on file  Physical Activity: Not on file  Stress: Not on file  Social Connections: Not on file  Intimate Partner Violence: Not on file    Physical Exam: Vital signs in last 24 hours: '@VSRANGES'$ @   GEN: NAD EYE: Sclerae anicteric ENT: MMM CV: Non-tachycardic GI: Soft, NT/ND NEURO:  Alert & Oriented x 3  Lab Results: No results for input(s): WBC, HGB, HCT, PLT in the last 72 hours. BMET No results for input(s):  NA, K, CL, CO2, GLUCOSE, BUN, CREATININE, CALCIUM in the last 72 hours. LFT No results for input(s): PROT, ALBUMIN, AST, ALT, ALKPHOS, BILITOT, BILIDIR, IBILI in the last 72 hours. PT/INR No results for input(s): LABPROT, INR in the last 72 hours.   Impression / Plan: This is a 70 y.o.female who presents for EGD for evaluation of abdominal pain and history of prior gastric ulcers and GERD.  The risks and benefits of endoscopic evaluation/treatment were discussed with the patient and/or family; these include but are not limited to the risk of perforation, infection, bleeding, missed lesions, lack of diagnosis, severe illness requiring hospitalization, as well as anesthesia and sedation related illnesses.  The patient's history has been reviewed, patient examined, no change in status, and deemed stable for procedure.  The patient and/or family is agreeable to proceed.    Justice Britain,  MD [facility] # [phone]

## 2021-03-17 NOTE — Progress Notes (Signed)
Report to PACU, RN, vss, BBS= Clear.  

## 2021-03-18 ENCOUNTER — Ambulatory Visit (HOSPITAL_COMMUNITY): Payer: Medicaid Other

## 2021-03-18 ENCOUNTER — Other Ambulatory Visit: Payer: Self-pay

## 2021-03-18 DIAGNOSIS — R1013 Epigastric pain: Secondary | ICD-10-CM

## 2021-03-18 MED ORDER — ESOMEPRAZOLE MAGNESIUM 40 MG PO CPDR
40.0000 mg | DELAYED_RELEASE_CAPSULE | Freq: Every day | ORAL | 11 refills | Status: DC
Start: 2021-03-18 — End: 2021-04-29

## 2021-03-21 ENCOUNTER — Telehealth: Payer: Self-pay | Admitting: *Deleted

## 2021-03-21 NOTE — Telephone Encounter (Signed)
  Follow up Call-  Call back number 03/17/2021  Post procedure Call Back phone  # [phone]  Permission to leave phone message Yes     Patient questions:  Do you have a fever, pain , or abdominal swelling? No. Pain Score  0 *  Have you tolerated food without any problems? Yes.    Have you been able to return to your normal activities? Yes.    Do you have any questions about your discharge instructions: Diet   No. Medications  No. Follow up visit  No.  Do you have questions or concerns about your Care? No.  Actions: * If pain score is 4 or above: No action needed, pain <4.  Have you developed a fever since your procedure? no  2.   Have you had an respiratory symptoms (SOB or cough) since your procedure? no  3.   Have you tested positive for COVID 19 since your procedure no  4.   Have you had any family members/close contacts diagnosed with the COVID 19 since your procedure?  no   If yes to any of these questions please route to Joylene John, RN and Joella Prince, RN

## 2021-03-22 ENCOUNTER — Encounter: Payer: Self-pay | Admitting: Gastroenterology

## 2021-03-25 ENCOUNTER — Other Ambulatory Visit: Payer: Self-pay

## 2021-03-25 ENCOUNTER — Ambulatory Visit (HOSPITAL_COMMUNITY)
Admission: RE | Admit: 2021-03-25 | Discharge: 2021-03-25 | Disposition: A | Discharge status: Home / Self Care | Payer: Medicare Other | Source: Ambulatory Visit | Attending: Gastroenterology | Admitting: Gastroenterology

## 2021-03-25 DIAGNOSIS — Z8711 Personal history of peptic ulcer disease: Secondary | ICD-10-CM | POA: Diagnosis not present

## 2021-03-25 DIAGNOSIS — R748 Abnormal levels of other serum enzymes: Secondary | ICD-10-CM | POA: Insufficient documentation

## 2021-03-25 DIAGNOSIS — Z862 Personal history of diseases of the blood and blood-forming organs and certain disorders involving the immune mechanism: Secondary | ICD-10-CM | POA: Diagnosis not present

## 2021-03-25 DIAGNOSIS — R7989 Other specified abnormal findings of blood chemistry: Secondary | ICD-10-CM | POA: Diagnosis not present

## 2021-03-25 DIAGNOSIS — D7589 Other specified diseases of blood and blood-forming organs: Secondary | ICD-10-CM | POA: Insufficient documentation

## 2021-03-25 DIAGNOSIS — K829 Disease of gallbladder, unspecified: Secondary | ICD-10-CM | POA: Diagnosis not present

## 2021-03-25 DIAGNOSIS — K7689 Other specified diseases of liver: Secondary | ICD-10-CM | POA: Diagnosis not present

## 2021-03-28 DIAGNOSIS — M62838 Other muscle spasm: Secondary | ICD-10-CM | POA: Diagnosis not present

## 2021-03-28 DIAGNOSIS — E782 Mixed hyperlipidemia: Secondary | ICD-10-CM | POA: Diagnosis not present

## 2021-03-28 DIAGNOSIS — I10 Essential (primary) hypertension: Secondary | ICD-10-CM | POA: Diagnosis not present

## 2021-03-28 DIAGNOSIS — R3129 Other microscopic hematuria: Secondary | ICD-10-CM | POA: Diagnosis not present

## 2021-03-28 DIAGNOSIS — R1013 Epigastric pain: Secondary | ICD-10-CM | POA: Diagnosis not present

## 2021-03-28 DIAGNOSIS — D696 Thrombocytopenia, unspecified: Secondary | ICD-10-CM | POA: Diagnosis not present

## 2021-03-28 DIAGNOSIS — R771 Abnormality of globulin: Secondary | ICD-10-CM | POA: Diagnosis not present

## 2021-03-28 DIAGNOSIS — E1165 Type 2 diabetes mellitus with hyperglycemia: Secondary | ICD-10-CM | POA: Diagnosis not present

## 2021-03-28 DIAGNOSIS — R7989 Other specified abnormal findings of blood chemistry: Secondary | ICD-10-CM | POA: Diagnosis not present

## 2021-03-28 DIAGNOSIS — N289 Disorder of kidney and ureter, unspecified: Secondary | ICD-10-CM | POA: Diagnosis not present

## 2021-04-04 ENCOUNTER — Ambulatory Visit: Payer: Medicaid Other

## 2021-04-18 DIAGNOSIS — N289 Disorder of kidney and ureter, unspecified: Secondary | ICD-10-CM | POA: Diagnosis not present

## 2021-04-18 DIAGNOSIS — E1165 Type 2 diabetes mellitus with hyperglycemia: Secondary | ICD-10-CM | POA: Diagnosis not present

## 2021-04-18 DIAGNOSIS — R771 Abnormality of globulin: Secondary | ICD-10-CM | POA: Diagnosis not present

## 2021-04-18 DIAGNOSIS — R3129 Other microscopic hematuria: Secondary | ICD-10-CM | POA: Diagnosis not present

## 2021-04-18 DIAGNOSIS — I10 Essential (primary) hypertension: Secondary | ICD-10-CM | POA: Diagnosis not present

## 2021-04-18 DIAGNOSIS — D696 Thrombocytopenia, unspecified: Secondary | ICD-10-CM | POA: Diagnosis not present

## 2021-04-18 DIAGNOSIS — E782 Mixed hyperlipidemia: Secondary | ICD-10-CM | POA: Diagnosis not present

## 2021-04-18 DIAGNOSIS — M62838 Other muscle spasm: Secondary | ICD-10-CM | POA: Diagnosis not present

## 2021-04-18 DIAGNOSIS — R7989 Other specified abnormal findings of blood chemistry: Secondary | ICD-10-CM | POA: Diagnosis not present

## 2021-04-18 DIAGNOSIS — R1013 Epigastric pain: Secondary | ICD-10-CM | POA: Diagnosis not present

## 2021-04-29 ENCOUNTER — Other Ambulatory Visit (HOSPITAL_COMMUNITY): Payer: Self-pay

## 2021-04-29 ENCOUNTER — Encounter: Payer: Self-pay | Admitting: Gastroenterology

## 2021-04-29 ENCOUNTER — Ambulatory Visit (INDEPENDENT_AMBULATORY_CARE_PROVIDER_SITE_OTHER): Payer: Medicare Other | Admitting: Gastroenterology

## 2021-04-29 ENCOUNTER — Other Ambulatory Visit (INDEPENDENT_AMBULATORY_CARE_PROVIDER_SITE_OTHER): Payer: Medicare Other

## 2021-04-29 VITALS — BP 142/70 | HR 87 | Ht <= 58 in | Wt 148.0 lb

## 2021-04-29 DIAGNOSIS — R7989 Other specified abnormal findings of blood chemistry: Secondary | ICD-10-CM | POA: Diagnosis not present

## 2021-04-29 DIAGNOSIS — R1013 Epigastric pain: Secondary | ICD-10-CM | POA: Diagnosis not present

## 2021-04-29 DIAGNOSIS — K257 Chronic gastric ulcer without hemorrhage or perforation: Secondary | ICD-10-CM | POA: Diagnosis not present

## 2021-04-29 DIAGNOSIS — K259 Gastric ulcer, unspecified as acute or chronic, without hemorrhage or perforation: Secondary | ICD-10-CM | POA: Insufficient documentation

## 2021-04-29 DIAGNOSIS — D7389 Other diseases of spleen: Secondary | ICD-10-CM | POA: Diagnosis not present

## 2021-04-29 DIAGNOSIS — E611 Iron deficiency: Secondary | ICD-10-CM

## 2021-04-29 LAB — CBC
HCT: 35.3 % — ABNORMAL LOW (ref 36.0–46.0)
Hemoglobin: 12 g/dL (ref 12.0–15.0)
MCHC: 33.9 g/dL (ref 30.0–36.0)
MCV: 96.5 fl (ref 78.0–100.0)
Platelets: 104 10*3/uL — ABNORMAL LOW (ref 150.0–400.0)
RBC: 3.66 Mil/uL — ABNORMAL LOW (ref 3.87–5.11)
RDW: 13.5 % (ref 11.5–15.5)
WBC: 5.2 10*3/uL (ref 4.0–10.5)

## 2021-04-29 LAB — IBC + FERRITIN
Ferritin: 21 ng/mL (ref 10.0–291.0)
Iron: 33 ug/dL — ABNORMAL LOW (ref 42–145)
Saturation Ratios: 7.9 % — ABNORMAL LOW (ref 20.0–50.0)
TIBC: 415.8 ug/dL (ref 250.0–450.0)
Transferrin: 297 mg/dL (ref 212.0–360.0)

## 2021-04-29 LAB — CREATININE, SERUM: Creatinine, Ser: 1.85 mg/dL — ABNORMAL HIGH (ref 0.40–1.20)

## 2021-04-29 LAB — BUN: BUN: 31 mg/dL — ABNORMAL HIGH (ref 6–23)

## 2021-04-29 MED ORDER — FERROUS GLUCONATE 324 (38 FE) MG PO TABS
324.0000 mg | ORAL_TABLET | Freq: Every day | ORAL | 10 refills | Status: DC
  Filled 2021-04-29: qty 30, 30d supply, fill #0

## 2021-04-29 MED ORDER — FERROUS GLUCONATE 324 (38 FE) MG PO TABS: 324.0000 mg | ORAL_TABLET | Freq: Every day | ORAL | 0 refills | Status: DC

## 2021-04-29 MED ORDER — ESOMEPRAZOLE MAGNESIUM 40 MG PO CPDR
40.0000 mg | DELAYED_RELEASE_CAPSULE | Freq: Every day | ORAL | 10 refills | Status: DC
  Filled 2021-04-29: qty 30, 30d supply, fill #0

## 2021-04-29 NOTE — Progress Notes (Signed)
[place] VISIT   Primary Care Provider Trey Sailors, [place] [address]  [address] [phone]  Patient Profile: Stacey Chambers is a 70 y.o. 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  The patient presents to the [facility] for an evaluation and management of problem(s) noted below:  Problem List 1. 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 Iron deficiency   3. Abnormal LFTs   4. Splenic lesion   5. Abdominal pain, epigastric     History of Present Illness Please see prior note for full details of HPI.  Interval History Today, the patient returns for a scheduled follow-up.  Interpreter services are available with her today.  The patient states that she is very happy and doing extremely well.  Since her last visit she underwent an upper endoscopy with results as below showing evidence of a persisting gastric ulcer.  PPI therapy was initiated and she has been doing relatively well.  She did undergo an abdominal ultrasound with findings as below that showed some concerning findings for potential fatty liver but also a splenic lesion.  Our team had tried to reach out to her but was unsuccessful with wanting to discuss with her potential cross-sectional imaging of her abdomen.  Patient has no significant symptoms at this time.  Pyrosis symptoms are also improved.  Her previous dyspepsia symptoms are also improved.  She is not drinking any alcohol at this time.  Patient is not using significant nonsteroidals or BC/Goody powders.  She has been experiencing some other issues that she is considering potential reevaluation by a new primary care provider.  Patient also is having issues with transportation and is concerned about  the repeat endoscopy that we had discussed and how she would be able to come to that.  We had also found that the patient had evidence of iron deficiency but because our team had not been able to reach her she has not initiated any oral iron therapy.  The patient describes no alteration of her bowel habits at this time and she is not noticing any blood in her stools (melena/maroon/hematochezia).  GI Review of Systems Positive as above Negative for odynophagia, dysphagia, nausea, vomiting, alteration of bowel habits   Review of Systems General: Denies fevers/chills/weight loss unintentionally Cardiovascular: Denies chest pain Pulmonary: Denies shortness of breath Gastroenterological: See HPI Genitourinary: Denies darkened urine or hematuria Hematological: Denies easy bruising/bleeding Dermatological: Denies jaundice Psychological: Mood is stable   Medications Current Outpatient Medications  Medication Sig Dispense Refill   ACCU-CHEK GUIDE test strip      Accu-Chek Softclix Lancets lancets SMARTSIG:Topical     amLODipine (NORVASC) 10 MG tablet Take 10 mg by mouth daily.     glipiZIDE (GLUCOTROL) 5 MG tablet Take 5 mg by mouth daily.     esomeprazole (NEXIUM) 40 MG capsule Take 1 capsule (40 mg total) by mouth daily 30 capsule 10   ferrous gluconate (FERGON) 324 MG tablet Take 1 tablet (324 mg total) by mouth daily with breakfast 30 tablet 10   No current facility-administered medications for this visit.    Allergies No Known Allergies  Histories Past Medical History:  Diagnosis Date   Diabetes mellitus without complication ([place])    Hypertension    Vision loss    Past Surgical History:  Procedure Laterality Date  BACK SURGERY     COLONOSCOPY     UPPER GASTROINTESTINAL ENDOSCOPY     Social History   Socioeconomic History   Marital status: Married    Spouse name: Not on file   Number of children: Not on file   Years of education: Not on file   Highest education level:  Not on file  Occupational History   Not on file  Tobacco Use   Smoking status: Never   Smokeless tobacco: Never  Vaping Use   Vaping Use: Never used  Substance and Sexual Activity   Alcohol use: Never   Drug use: Yes    Types: Marijuana    Comment: Use of THC tea 1-2 times per week   Sexual activity: Not on file  Other Topics Concern   Not on file  Social History Narrative   Not on file   Social Determinants of Health   Financial Resource Strain: Not on file  Food Insecurity: Not on file  Transportation Needs: Not on file  Physical Activity: Not on file  Stress: Not on file  Social Connections: Not on file  Intimate Partner Violence: Not on file   Family History  Problem Relation Age of Onset   Diabetes Brother    Colon cancer Neg Hx    Esophageal cancer Neg Hx    Stomach cancer Neg Hx    Pancreatic cancer Neg Hx    Inflammatory bowel disease Neg Hx    Liver disease Neg Hx    Rectal cancer Neg Hx    I have reviewed her medical, social, and family history in detail and updated the electronic medical record as necessary.    PHYSICAL EXAMINATION  BP (!) 142/70   Pulse 87   Ht 4\' 10"  (1.473 m)   Wt 148 lb (67.1 kg)   SpO2 99%   BMI 30.93 kg/m  Wt Readings from Last 3 Encounters:  04/29/21 148 lb (67.1 kg)  03/17/21 149 lb (67.6 kg)  03/11/21 149 lb 3.2 oz (67.7 kg)  GEN: NAD, appears stated age, doesn't appear chronically ill, interpreter available PSYCH: Cooperative, without pressured speech EYE: Conjunctivae pink, sclerae anicteric ENT: MMM CV: Nontachycardic RESP: No audible wheezing GI: NABS, soft, NT/ND, without rebound or guarding MSK/EXT: No lower extremity edema SKIN: No jaundice, no spider angiomata NEURO:  Alert & Oriented x 3, no focal deficits, no evidence of asterixis   REVIEW OF DATA  I reviewed the following data at the time of this encounter:  GI Procedures and Studies  September 2022 EGD - No gross lesions in esophagus. Biopsied. -  Z-line irregular, 39 cm from the incisors. - 1 cm hiatal hernia. - Non-bleeding gastric ulcer with a clean ulcer base (Forrest Class III) in antrum. Gastritis throughout - biopsied. - Erythematous duodenopathy. Biopsied.  Pathology Diagnosis 1. Surgical [P], duodenal/duodenitis - PEPTIC DUODENITIS. - NO DYSPLASIA OR MALIGNANCY. 2. 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 - IMMUNOHISTOCHEMISTRY IS NEGATIVE FOR HELICOBACTER PYLORI. - NO DYSPLASIA OR MALIGNANCY. 3. Surgical [P], esophagus - BENIGN SQUAMOUS MUCOSA. - NO INCREASE IN INTRAEPITHELIAL EOSINOPHILS. - NO INTESTINAL METAPLASIA, DYSPLASIA, OR MALIGNANCY.  Laboratory Studies  Reviewed those in epic and care everywhere  Imaging Studies  September 2022 abdominal ultrasound Reviewed those in epic and care everywhere IMPRESSION: 1. Slight increased gallbladder wall thickness, nonspecific, negative for shadowing stone or sonographic Murphy 2. Heterogeneous liver echotexture likely related to heterogeneous fat infiltration 3. Possible 1.5 cm echogenic splenic mass.  This could be further evaluated with MRI   ASSESSMENT  Stacey Chambers is a 70 y.o. 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  The patient is seen today for evaluation and management of:  1. 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 Iron deficiency   3. Abnormal LFTs   4. Splenic lesion   5. Abdominal pain, epigastric    The patient is clinically and hemodynamically stable.  She is doing well on PPI therapy which is treating her gastric ulcer as well as her previous dyspepsia.  We discussed the role of repeat endoscopic evaluation to ensure healing of her gastric ulcer in a few more months.  She has some  concerns about her ability to get to that procedure since she does not have transportation.  We will ask [place] transportation to see if she may be able to have some assistance in this case and near future.  Patient has had some iron deficiency without overt anemia.  She was not able to initiate oral iron supplementation.  She has had upper and lower endoscopic evaluation in the recent past in [place] but if iron deficiency persists after initiation of oral iron supplementation then we will need to consider colonoscopy evaluation at the time of her follow-up endoscopy.  We will give her 2 months of oral iron therapy and recheck her labs in December and subsequently in January reevaluate her in clinic and then decide as to whether we pursue both upper and lower endoscopy.  She will remain on her current PPI dosing.  We will send a prescription to her current pharmacy and then work on a [place] mailing service to help her get her medications.  Her iron will be sent to her current pharmacy and also then sent to [place] for mailing service.  The patient would like a referral to a new PCP in this area that may have interpreter services available as well so we will place that.  She is also asking for an ophthalmology referral and we will place that today.  In regards to the splenic lesion cross-sectional imaging is recommended and we will move forward with an MRI abdomen at the patient's convenience to further define the spleen and ensure there are no other lesions present.  Her longer standing thrombocytopenia will also be able to visualize whether there is any evidence of splenomegaly or any other changes in the liver.  We will repeat her liver tests to see how they look now that she has been abstinent of all alcohol.  All patient questions were answered to the best of my ability, and the patient agrees to the aforementioned plan of action with follow-up as indicated.   PLAN   Laboratories as outlined bel proceed with scheduling MRI abdomen with and without contrast Continue Nexium 40 mg daily Initiate ferrous gluconate 324 mg daily Repeat CMP/INR/iron/TIBC/ferritin in December 2022 Follow-up in clinic in January 2023 - We will consider EGD/colonoscopy based on iron levels versus just EGD for surveillance of previous gastric ulcer Continue alcohol cessation Holding on liver biopsy for now PCP referral placed Ophthalmology referral placed   Orders Placed This Encounter  Procedures   MR Abdomen W Wo Contrast   CBC   IBC + Ferritin   BUN   Creatinine     New Prescriptions   FERROUS  GLUCONATE (FERGON) 324 MG TABLET    Take 1 tablet (324 mg total) by mouth daily with breakfast   Modified Medications   Modified Medication Previous Medication   ESOMEPRAZOLE (NEXIUM) 40 MG CAPSULE esomeprazole (NEXIUM) 40 MG capsule      Take 1 capsule (40 mg total) by mouth daily    Take 1 capsule (40 mg total) by mouth daily.    Planned Follow Up No follow-ups on file.   Total Time in Face-to-Face and in Coordination of Care for patient including independent/personal interpretation/review of prior testing, medical history, examination, medication adjustment, communicating results with the patient directly, and documentation with the EHR is 25 minutes.   Justice Britain, MD [facility] # [phone]

## 2021-04-29 NOTE — Patient Instructions (Addendum)
You will be due for an endoscopy in 07/2021. We will send you a reminder in the mail when it gets closer to that time. _________________________________________________  We have sent the following medications to your pharmacy for you to pick up at your convenience:  Nexium 40 mg daily (sent to [facility]) Ferrous Gluconate 324 mg once daily (1 month [facility], additional refills to [facility]) __________________________________________________  Your provider has requested that you go to the basement level for lab work the week of 06/20/21 any time Monday-Thursday, 8 am-5 pm. Press "B" on the elevator. The lab is located at the first door on the left as you exit the elevator. __________________________________________________  Dennis Bast have been scheduled for an MRI at [facility] on 06/29/21. Your appointment time is 9:00 am. Please arrive to admitting (at main entrance of the hospital) 30 minutes prior to your appointment time for registration purposes. Please make certain not to have anything to eat or drink 6 hours prior to your test. In addition, if you have any metal in your body, have a pacemaker or defibrillator, please be sure to let your ordering physician know. This test typically takes 45 minutes to 1 hour to complete. Should you need to reschedule, please call [phone] to do so. __________________________________________________ You have been scheduled for an appointment with Dr Sherrie Mustache on Friday, 05/13/21 at 1:30 pm. You should arrive at 1:00 pm for registration.  Address: [facility] [address] [place],  [place]  [address] Phone: [phone] ___________________________________________________  Dennis Bast have been scheduled for an appointment with [facility] on 07/26/21 at 8:45 am. Please arrive at 8:30 am for the appointment.  Address: [place], [place], [place]  [address] Phone: [phone] ___________________________________________________  [employer] siguientes medicamentos a su farmacia para que los recoja a su conveniencia:  Nexium 40 mg al da (enviado a la farmacia para pacientes ambulatorios de [place]) Gluconato ferroso 324 mg Dollene Cleveland al da (1 mes [facility]ado [facility], resurtidos adicionales a la farmacia para pacientes ambulatorios de [facility]) __________________________________________________  Kasandra Knudsen proveedor ha solicitado que vaya al nivel del stano para el trabajo de laboratorio la semana del 19/12/22 en cualquier momento de lunes a jueves, de 8 am a 5 pm. Presione "B" en el ascensor. El laboratorio est ubicado en la primera puerta a la izquierda al salir del Materials engineer. __________________________________________________  Clois Comber le ha programado una resonancia magntica en [facility] el 28/12/22. La hora de su cita es a las 9:00 am. Llegue a admisin (en la entrada principal del hospital) 30 minutos antes de la hora de su cita para fines de Control and instrumentation engineer. Asegrese de no comer ni beber nada 6 horas antes de la prueba. Adems, si tiene algn metal en el cuerpo, tiene un marcapasos o un desfibrilador, asegrese de hacrselo saber al mdico que lo solicit. Esta prueba suele tardar entre 8 minutos y 1 hora en completarse. Si necesita reprogramar, llame al [phone] para hacerlo. __________________________________________________ Leonia Reeves program una cita con la Dra. Sherrie Mustache el viernes 05/13/21 a la 1:30 p. m. Debes llegar a la 1:00 pm para registrarte.  Direccin: [facility] [place], [place] T[place]ono: [phone] ____________________________________________________  Leonia Reeves ha programado Ardelia Mems cita con [facility] el 24/1/23 a las 8:45 a. m. Favor de llegar a las 8:30 am para la cita.  Direccin: [address], [place], [place]  [address] Telfono: [phone] ____________________________________________________

## 2021-05-02 ENCOUNTER — Other Ambulatory Visit: Payer: Self-pay

## 2021-05-02 ENCOUNTER — Encounter: Payer: Self-pay | Admitting: Gastroenterology

## 2021-05-02 DIAGNOSIS — K257 Chronic gastric ulcer without hemorrhage or perforation: Secondary | ICD-10-CM

## 2021-05-02 DIAGNOSIS — R7989 Other specified abnormal findings of blood chemistry: Secondary | ICD-10-CM

## 2021-05-02 DIAGNOSIS — E611 Iron deficiency: Secondary | ICD-10-CM

## 2021-05-02 NOTE — Progress Notes (Signed)
Per Catherin Lenell Antu, [facility], patient has been scheduled for endoscopy and no longer needs ophthalmology appointment....  "Hey, this is all set. I scheduled this lady for her EGD and gave her all your info. She no longer needs appt with Ophthalmology. She already got one at [facility].  "- 05/02/21 11:47 am  I have contacted [facility] and cancelled the previously scheduled appointment.

## 2021-05-04 ENCOUNTER — Other Ambulatory Visit: Payer: Self-pay | Admitting: Gastroenterology

## 2021-05-13 ENCOUNTER — Ambulatory Visit: Payer: Medicare Other | Admitting: Nurse Practitioner

## 2021-05-18 DIAGNOSIS — N2581 Secondary hyperparathyroidism of renal origin: Secondary | ICD-10-CM | POA: Diagnosis not present

## 2021-05-18 DIAGNOSIS — N1832 Chronic kidney disease, stage 3b: Secondary | ICD-10-CM | POA: Diagnosis not present

## 2021-05-18 DIAGNOSIS — I129 Hypertensive chronic kidney disease with stage 1 through stage 4 chronic kidney disease, or unspecified chronic kidney disease: Secondary | ICD-10-CM | POA: Diagnosis not present

## 2021-05-18 DIAGNOSIS — R319 Hematuria, unspecified: Secondary | ICD-10-CM | POA: Diagnosis not present

## 2021-05-18 DIAGNOSIS — R809 Proteinuria, unspecified: Secondary | ICD-10-CM | POA: Diagnosis not present

## 2021-05-18 DIAGNOSIS — E1122 Type 2 diabetes mellitus with diabetic chronic kidney disease: Secondary | ICD-10-CM | POA: Diagnosis not present

## 2021-05-18 DIAGNOSIS — D509 Iron deficiency anemia, unspecified: Secondary | ICD-10-CM | POA: Diagnosis not present

## 2021-06-07 ENCOUNTER — Ambulatory Visit: Payer: Medicare Other | Admitting: Gastroenterology

## 2021-06-29 ENCOUNTER — Ambulatory Visit (HOSPITAL_COMMUNITY)
Admission: RE | Admit: 2021-06-29 | Discharge: 2021-06-29 | Disposition: A | Discharge status: Home / Self Care | Payer: Medicare Other | Source: Ambulatory Visit | Attending: Gastroenterology | Admitting: Gastroenterology

## 2021-06-29 ENCOUNTER — Other Ambulatory Visit: Payer: Self-pay

## 2021-06-29 DIAGNOSIS — D7389 Other diseases of spleen: Secondary | ICD-10-CM | POA: Diagnosis not present

## 2021-06-29 DIAGNOSIS — K257 Chronic gastric ulcer without hemorrhage or perforation: Secondary | ICD-10-CM | POA: Insufficient documentation

## 2021-06-29 DIAGNOSIS — N281 Cyst of kidney, acquired: Secondary | ICD-10-CM | POA: Diagnosis not present

## 2021-06-29 DIAGNOSIS — K760 Fatty (change of) liver, not elsewhere classified: Secondary | ICD-10-CM | POA: Diagnosis not present

## 2021-06-29 DIAGNOSIS — E611 Iron deficiency: Secondary | ICD-10-CM | POA: Insufficient documentation

## 2021-06-29 DIAGNOSIS — Q8909 Congenital malformations of spleen: Secondary | ICD-10-CM | POA: Diagnosis not present

## 2021-06-29 MED ORDER — GADOBUTROL 1 MMOL/ML IV SOLN
7.0000 mL | Freq: Once | INTRAVENOUS | Status: AC | PRN
  Administered 2021-06-29: 09:00:00 7 mL via INTRAVENOUS

## 2021-07-05 NOTE — Progress Notes (Signed)
Final attempt to reach pt via [place] interpreter, left voicemail to call back. Letter has been sent to home address.

## 2021-07-06 ENCOUNTER — Telehealth: Payer: Self-pay | Admitting: *Deleted

## 2021-07-06 NOTE — Telephone Encounter (Signed)
Patient was called again by Cathy-scheduler. No answer, message left.

## 2021-07-06 NOTE — Telephone Encounter (Signed)
Dr.Mansouraty was notified and 3 month recall placed in epic by Chong Sicilian, RN. No show letter mailed to pt and PV & EGD cancelled.   Messages was left in [name] by cone interpreter and [place].

## 2021-07-06 NOTE — Telephone Encounter (Signed)
Patient no show PV appointment for today. [name] Interpreter called patient x2, no answer, left a message for the patient to call us back today before 5 pm to reschedule PV.

## 2021-07-19 ENCOUNTER — Encounter: Payer: Medicare Other | Admitting: Gastroenterology

## 2021-07-19 DIAGNOSIS — M62838 Other muscle spasm: Secondary | ICD-10-CM | POA: Diagnosis not present

## 2021-07-19 DIAGNOSIS — Z136 Encounter for screening for cardiovascular disorders: Secondary | ICD-10-CM | POA: Diagnosis not present

## 2021-07-19 DIAGNOSIS — E782 Mixed hyperlipidemia: Secondary | ICD-10-CM | POA: Diagnosis not present

## 2021-07-19 DIAGNOSIS — R1013 Epigastric pain: Secondary | ICD-10-CM | POA: Diagnosis not present

## 2021-07-19 DIAGNOSIS — E1165 Type 2 diabetes mellitus with hyperglycemia: Secondary | ICD-10-CM | POA: Diagnosis not present

## 2021-07-19 DIAGNOSIS — N1831 Chronic kidney disease, stage 3a: Secondary | ICD-10-CM | POA: Diagnosis not present

## 2021-07-19 DIAGNOSIS — Z0000 Encounter for general adult medical examination without abnormal findings: Secondary | ICD-10-CM | POA: Diagnosis not present

## 2021-07-19 DIAGNOSIS — I10 Essential (primary) hypertension: Secondary | ICD-10-CM | POA: Diagnosis not present

## 2021-07-19 DIAGNOSIS — R7989 Other specified abnormal findings of blood chemistry: Secondary | ICD-10-CM | POA: Diagnosis not present

## 2021-07-26 DIAGNOSIS — E119 Type 2 diabetes mellitus without complications: Secondary | ICD-10-CM | POA: Diagnosis not present

## 2021-07-26 DIAGNOSIS — H1789 Other corneal scars and opacities: Secondary | ICD-10-CM | POA: Diagnosis not present

## 2021-08-09 ENCOUNTER — Telehealth: Payer: Self-pay

## 2021-08-09 NOTE — Telephone Encounter (Signed)
No call back to r/s PV.  Pr0cedure cancelled.

## 2021-08-09 NOTE — Telephone Encounter (Signed)
(  FYI Dr. Rush Landmark)   Attempted to reach patient for virtual since was a no show in person for 2:30 appt.  Jodene Nam Interpreter, present.  Reached pt to do phone visit, pt stated to Shanon Brow that she was told the appt was 2/17 when explained that it was today.  Was unable to attempt virtual visit due to pt hanging up.  Attempted to call back again, phone call connected and it appeared that when pt heard our voices she hung up again. Unable to do virtual visit or communicate lab needs.  Will see if she calls back to r/s PV by 5:00 to day, if not NS letter will be sent and procedures cancelled.

## 2021-08-09 NOTE — Telephone Encounter (Signed)
Sounds reasonable. Thanks for trying. GM

## 2021-08-10 DIAGNOSIS — N1831 Chronic kidney disease, stage 3a: Secondary | ICD-10-CM | POA: Diagnosis not present

## 2021-08-10 DIAGNOSIS — E1165 Type 2 diabetes mellitus with hyperglycemia: Secondary | ICD-10-CM | POA: Diagnosis not present

## 2021-08-10 DIAGNOSIS — I10 Essential (primary) hypertension: Secondary | ICD-10-CM | POA: Diagnosis not present

## 2021-08-10 DIAGNOSIS — E782 Mixed hyperlipidemia: Secondary | ICD-10-CM | POA: Diagnosis not present

## 2021-08-10 DIAGNOSIS — M62838 Other muscle spasm: Secondary | ICD-10-CM | POA: Diagnosis not present

## 2021-08-10 DIAGNOSIS — R1013 Epigastric pain: Secondary | ICD-10-CM | POA: Diagnosis not present

## 2021-08-10 DIAGNOSIS — R7989 Other specified abnormal findings of blood chemistry: Secondary | ICD-10-CM | POA: Diagnosis not present

## 2021-08-10 DIAGNOSIS — R609 Edema, unspecified: Secondary | ICD-10-CM | POA: Diagnosis not present

## 2021-08-23 ENCOUNTER — Encounter: Payer: Medicare Other | Admitting: Gastroenterology

## 2021-08-23 DIAGNOSIS — E782 Mixed hyperlipidemia: Secondary | ICD-10-CM | POA: Diagnosis not present

## 2021-08-23 DIAGNOSIS — E1165 Type 2 diabetes mellitus with hyperglycemia: Secondary | ICD-10-CM | POA: Diagnosis not present

## 2021-08-23 DIAGNOSIS — I10 Essential (primary) hypertension: Secondary | ICD-10-CM | POA: Diagnosis not present

## 2021-08-23 DIAGNOSIS — N1831 Chronic kidney disease, stage 3a: Secondary | ICD-10-CM | POA: Diagnosis not present

## 2021-08-30 ENCOUNTER — Other Ambulatory Visit: Payer: Self-pay

## 2021-08-30 ENCOUNTER — Other Ambulatory Visit (INDEPENDENT_AMBULATORY_CARE_PROVIDER_SITE_OTHER): Payer: Medicare Other

## 2021-08-30 ENCOUNTER — Ambulatory Visit (AMBULATORY_SURGERY_CENTER): Payer: Self-pay | Admitting: *Deleted

## 2021-08-30 VITALS — Ht <= 58 in | Wt 144.4 lb

## 2021-08-30 DIAGNOSIS — K257 Chronic gastric ulcer without hemorrhage or perforation: Secondary | ICD-10-CM

## 2021-08-30 DIAGNOSIS — E611 Iron deficiency: Secondary | ICD-10-CM

## 2021-08-30 DIAGNOSIS — R7989 Other specified abnormal findings of blood chemistry: Secondary | ICD-10-CM

## 2021-08-30 DIAGNOSIS — Z862 Personal history of diseases of the blood and blood-forming organs and certain disorders involving the immune mechanism: Secondary | ICD-10-CM

## 2021-08-30 LAB — CBC WITH DIFFERENTIAL/PLATELET
Basophils Absolute: 0 10*3/uL (ref 0.0–0.1)
Basophils Relative: 0.7 % (ref 0.0–3.0)
Eosinophils Absolute: 0.1 10*3/uL (ref 0.0–0.7)
Eosinophils Relative: 1.9 % (ref 0.0–5.0)
HCT: 33.1 % — ABNORMAL LOW (ref 36.0–46.0)
Hemoglobin: 11.2 g/dL — ABNORMAL LOW (ref 12.0–15.0)
Lymphocytes Relative: 16.1 % (ref 12.0–46.0)
Lymphs Abs: 1 10*3/uL (ref 0.7–4.0)
MCHC: 33.9 g/dL (ref 30.0–36.0)
MCV: 93.9 fl (ref 78.0–100.0)
Monocytes Absolute: 0.5 10*3/uL (ref 0.1–1.0)
Monocytes Relative: 9 % (ref 3.0–12.0)
Neutro Abs: 4.4 10*3/uL (ref 1.4–7.7)
Neutrophils Relative %: 72.3 % (ref 43.0–77.0)
Platelets: 110 10*3/uL — ABNORMAL LOW (ref 150.0–400.0)
RBC: 3.52 Mil/uL — ABNORMAL LOW (ref 3.87–5.11)
RDW: 13 % (ref 11.5–15.5)
WBC: 6.1 10*3/uL (ref 4.0–10.5)

## 2021-08-30 LAB — IBC + FERRITIN
Ferritin: 25.1 ng/mL (ref 10.0–291.0)
Iron: 60 ug/dL (ref 42–145)
Saturation Ratios: 15.5 % — ABNORMAL LOW (ref 20.0–50.0)
TIBC: 386.4 ug/dL (ref 250.0–450.0)
Transferrin: 276 mg/dL (ref 212.0–360.0)

## 2021-08-30 LAB — COMPREHENSIVE METABOLIC PANEL
ALT: 18 U/L (ref 0–35)
AST: 40 U/L — ABNORMAL HIGH (ref 0–37)
Albumin: 3.2 g/dL — ABNORMAL LOW (ref 3.5–5.2)
Alkaline Phosphatase: 141 U/L — ABNORMAL HIGH (ref 39–117)
BUN: 38 mg/dL — ABNORMAL HIGH (ref 6–23)
CO2: 19 mEq/L (ref 19–32)
Calcium: 8.8 mg/dL (ref 8.4–10.5)
Chloride: 106 mEq/L (ref 96–112)
Creatinine, Ser: 2.17 mg/dL — ABNORMAL HIGH (ref 0.40–1.20)
GFR: 22.89 mL/min — ABNORMAL LOW (ref >60.00)
Glucose, Bld: 116 mg/dL — ABNORMAL HIGH (ref 70–99)
Potassium: 4.9 mEq/L (ref 3.5–5.1)
Sodium: 135 mEq/L (ref 135–145)
Total Bilirubin: 0.7 mg/dL (ref 0.2–1.2)
Total Protein: 7.7 g/dL (ref 6.0–8.3)

## 2021-08-30 MED ORDER — NA SULFATE-K SULFATE-MG SULF 17.5-3.13-1.6 GM/177ML PO SOLN: 2.0000 | Freq: Once | ORAL | 0 refills | Status: AC

## 2021-08-30 NOTE — Progress Notes (Signed)
No egg or soy allergy known to patient  No issues known to pt with past sedation with any surgeries or procedures Patient denies ever being told they had issues or difficulty with intubation  No FH of Malignant Hyperthermia Pt is not on diet pills Pt is not on  home 02  Pt is not on blood thinners  Pt denies issues with constipation  No A fib or A flutter   Due to the COVID-19 pandemic we are asking patients to follow certain guidelines in PV and [place]   Pt aware of COVID protocols and [facility] guidelines   PV completed in person, pt denied need for interpreter. Pt verified name, DOB, address and insurance during PV today.    Pt encouraged to call with questions or issues.  If pt has My chart, procedure instructions sent via My Chart   Explained to pt multiple times that she had to have someone here during the procedure and someone to drive her home after.  Asked pt to call us to cancel procedure if she could not find a care partner.

## 2021-09-12 ENCOUNTER — Telehealth: Payer: Self-pay

## 2021-09-12 NOTE — Telephone Encounter (Signed)
Unable to reach patient regarding upcoming procedure on both numbers. EGD with gastric mapping has been added on, okay given by Freddy Finner, however still have not heard back on whether or not procedure will need a pre cert. Will check back with them in the morning. ? ?Dr. Rush Landmark, just an fyi. ?

## 2021-09-12 NOTE — Telephone Encounter (Signed)
Thank you. ?Please work on Solicitor for tomorrow AM. ?She is last patient in the AM. ?Thanks. ?GM ?

## 2021-09-12 NOTE — Telephone Encounter (Signed)
-----   Message from Irving Copas., MD sent at 09/12/2021  4:20 PM EDT ----- ?Regarding: Procedure for tomorrow ?Freight forwarder for [employer], ?This patient has iron deficiency anemia and history of gastric ulcer and gastric intestinal metaplasia.  She needs an EGD with gastric mapping to be done at the same time as her colonoscopy.  Please make sure we get the approval so that we can move forward with the double procedure tomorrow (3/14).  Thank you. ?GM ? ? ?

## 2021-09-13 ENCOUNTER — Encounter: Payer: Self-pay | Admitting: Gastroenterology

## 2021-09-13 ENCOUNTER — Ambulatory Visit (AMBULATORY_SURGERY_CENTER): Payer: Medicare Other | Admitting: Gastroenterology

## 2021-09-13 VITALS — BP 171/80 | HR 81 | Temp 98.4°F | Resp 17 | Ht <= 58 in | Wt 144.0 lb

## 2021-09-13 DIAGNOSIS — K5730 Diverticulosis of large intestine without perforation or abscess without bleeding: Secondary | ICD-10-CM

## 2021-09-13 DIAGNOSIS — D509 Iron deficiency anemia, unspecified: Secondary | ICD-10-CM | POA: Diagnosis not present

## 2021-09-13 DIAGNOSIS — K31A0 Gastric intestinal metaplasia, unspecified: Secondary | ICD-10-CM

## 2021-09-13 DIAGNOSIS — K641 Second degree hemorrhoids: Secondary | ICD-10-CM

## 2021-09-13 DIAGNOSIS — K257 Chronic gastric ulcer without hemorrhage or perforation: Secondary | ICD-10-CM

## 2021-09-13 DIAGNOSIS — K2960 Other gastritis without bleeding: Secondary | ICD-10-CM | POA: Diagnosis not present

## 2021-09-13 DIAGNOSIS — Z1211 Encounter for screening for malignant neoplasm of colon: Secondary | ICD-10-CM | POA: Diagnosis not present

## 2021-09-13 DIAGNOSIS — K2950 Unspecified chronic gastritis without bleeding: Secondary | ICD-10-CM | POA: Diagnosis not present

## 2021-09-13 MED ORDER — FERROUS GLUCONATE 324 (38 FE) MG PO TABS: 324.0000 mg | ORAL_TABLET | Freq: Every day | ORAL | 12 refills | Status: DC

## 2021-09-13 MED ORDER — SODIUM CHLORIDE 0.9 % IV SOLN: 500.0000 mL | Freq: Once | INTRAVENOUS | Status: DC

## 2021-09-13 MED ORDER — ESOMEPRAZOLE MAGNESIUM 40 MG PO CPDR: 40.0000 mg | DELAYED_RELEASE_CAPSULE | Freq: Two times a day (BID) | ORAL | 11 refills | Status: DC

## 2021-09-13 NOTE — Progress Notes (Signed)
Pt in recovery with monitors in place, VSS. Report given to receiving RN. Bite guard was placed with pt awake to ensure comfort. No dental or soft tissue damage noted. 

## 2021-09-13 NOTE — Progress Notes (Signed)
Provided AVS and handouts on gastritis, hiatal hernia and high fiber diet in [name]. Interpreter Lockie Mola) used to give discharge instructions. Repeat EGD and Colonoscopy not scheduled during today's visit as the schedule is not open that far out for either procedure. Recalls placed for both procedures.  ?

## 2021-09-13 NOTE — Telephone Encounter (Signed)
Thank you for the update. GM 

## 2021-09-13 NOTE — Telephone Encounter (Signed)
Per Morton Amy, pre cert is in place and patient should be able to move forward with additional procedure.  ?

## 2021-09-13 NOTE — Op Note (Signed)
[place] ?Patient Name: Stacey Chambers ?Procedure Date: 09/13/2021 12:31 PM ?MRN: [id number] ?Endoscopist: Justice Britain , MD ?Age: 71 ?Referring MD:  ?Date of Birth: [birth year] ?Gender: Female ?Account #: [id number] ?Procedure:                Upper GI endoscopy ?Indications:              Iron deficiency anemia, Follow-up of gastrojejunal  ?                          ulcer, Follow-up of intestinal metaplasia ?Medicines:                Monitored Anesthesia Care ?Procedure:                Pre-Anesthesia Assessment: ?                          - Prior to the procedure, a History and Physical  ?                          was performed, and patient medications and  ?                          allergies were reviewed. The patient's tolerance of  ?                          previous anesthesia was also reviewed. The risks  ?                          and benefits of the procedure and the sedation  ?                          options and risks were discussed with the patient.  ?                          All questions were answered, and informed consent  ?                          was obtained. Prior Anticoagulants: The patient has  ?                          taken no previous anticoagulant or antiplatelet  ?                          agents. ASA Grade Assessment: II - A patient with  ?                          mild systemic disease. After reviewing the risks  ?                          and benefits, the patient was deemed in  ?                          satisfactory condition to undergo the procedure. ?  After obtaining informed consent, the endoscope was  ?                          passed under direct vision. Throughout the  ?                          procedure, the patient's blood pressure, pulse, and  ?                          oxygen saturations were monitored continuously. The  ?                          GIF [id number] #[id number] was introduced through the  ?                          mouth, and  advanced to the second part of duodenum.  ?                          The upper GI endoscopy was accomplished without  ?                          difficulty. The patient tolerated the procedure. ?Scope In: ?Scope Out: ?Findings:                 No gross lesions were noted in the entire esophagus. ?                          The Z-line was irregular and was found 39 cm from  ?                          the incisors. ?                          A 2 cm hiatal hernia was present. ?                          One non-bleeding cratered gastric ulcer with a  ?                          clean ulcer base (Forrest Class III) was found in  ?                          the gastric antrum. The lesion was 14 mm in largest  ?                          dimension. ?                          Patchy moderate inflammation characterized by  ?                          erosions, erythema and friability was found in the  ?  entire examined stomach. With patient's history of  ?                          GIM, gastric mapping biopsies were performed.  ?                          Biopsies were taken with a cold forceps for  ?                          histology from the antrum/incisura. Biopsies were  ?                          taken with a cold forceps for histology from the  ?                          greater curve/lesser curve. Biopsies were taken  ?                          with a cold forceps for histology from the  ?                          cardia/fundus. ?                          Patchy mildly erythematous mucosa without active  ?                          bleeding and with no stigmata of bleeding was found  ?                          in the duodenal bulb and in the first portion of  ?                          the duodenum. ?                          No gross lesions were noted in the second portion  ?                          of the duodenum. ?Complications:            No immediate complications. ?Estimated Blood Loss:      Estimated blood loss was minimal. ?Impression:               - No gross lesions in esophagus. Z-line irregular,  ?                          39 cm from the incisors. ?                          - 2 cm hiatal hernia. ?                          - Non-bleeding gastric ulcer with a clean ulcer  ?  base (Forrest Class III). Gastritis throughout.  ?                          Gastric mapping performed. ?                          - Erythematous duodenopathy in bulb. No gross  ?                          lesions in the second portion of the duodenum. ?Recommendation:           - Proceed to scheduled colonoscopy. ?                          - Needs to restart PPI. Nexium 40 mg twice daily.  ?                          Not clear why she is not taking this medication. ?                          - Will consider Carafate therapy twice daily, but  ?                          would first go back to PPI therapy. ?                          - Observe patient's clinical course. ?                          - Await pathology results. ?                          - Repeat EGD to ensure healing of ulcer in 20-months. ?                          - The findings and recommendations were discussed  ?                          with the patient. ?Justice Britain, MD ?09/13/2021 1:15:13 PM ?

## 2021-09-13 NOTE — Progress Notes (Signed)
? ?GASTROENTEROLOGY PROCEDURE H&P NOTE  ? ?Primary Care Physician: ?Trey Sailors, PA ? ?HPI: ?Stacey Chambers is a 71 y.o. female who presents for EGD/Colonoscopy for gastric intestinal metaplasia follow up and gastric ulcer follow up and colon cancer screening and further evaluation of iron deficiency and anemia. ? ?Past Medical History:  ?Diagnosis Date  ? Anemia   ? Diabetes mellitus without complication ([place])   ? Hypertension   ? Vision loss   ? ?Past Surgical History:  ?Procedure Laterality Date  ? BACK SURGERY    ? COLONOSCOPY    ? UPPER GASTROINTESTINAL ENDOSCOPY    ? ?Current Outpatient Medications  ?Medication Sig Dispense Refill  ? ACCU-CHEK GUIDE test strip     ? Accu-Chek Softclix Lancets lancets SMARTSIG:Topical    ? amLODipine (NORVASC) 10 MG tablet Take 10 mg by mouth daily. (Patient not taking: Reported on 08/30/2021)    ? amoxicillin (AMOXIL) 500 MG tablet Take 500 mg by mouth every 8 (eight) hours.    ? esomeprazole (NEXIUM) 40 MG capsule Take 1 capsule (40 mg total) by mouth daily (Patient not taking: Reported on 08/30/2021) 30 capsule 10  ? ferrous gluconate (FERGON) 324 MG tablet TAKE 1 TABLET BY MOUTH DAILY WITH BREAKFAST (Patient not taking: Reported on 08/30/2021) 30 tablet 10  ? glipiZIDE (GLUCOTROL) 5 MG tablet Take 5 mg by mouth daily. (Patient not taking: Reported on 08/30/2021)    ? ?No current facility-administered medications for this visit.  ? ? ?Current Outpatient Medications:  ?  ACCU-CHEK GUIDE test strip, , Disp: , Rfl:  ?  Accu-Chek Softclix Lancets lancets, SMARTSIG:Topical, Disp: , Rfl:  ?  amLODipine (NORVASC) 10 MG tablet, Take 10 mg by mouth daily. (Patient not taking: Reported on 08/30/2021), Disp: , Rfl:  ?  amoxicillin (AMOXIL) 500 MG tablet, Take 500 mg by mouth every 8 (eight) hours., Disp: , Rfl:  ?  esomeprazole (NEXIUM) 40 MG capsule, Take 1 capsule (40 mg total) by mouth daily (Patient not taking: Reported on 08/30/2021), Disp: 30 capsule, Rfl: 10 ?  ferrous  gluconate (FERGON) 324 MG tablet, TAKE 1 TABLET BY MOUTH DAILY WITH BREAKFAST (Patient not taking: Reported on 08/30/2021), Disp: 30 tablet, Rfl: 10 ?  glipiZIDE (GLUCOTROL) 5 MG tablet, Take 5 mg by mouth daily. (Patient not taking: Reported on 08/30/2021), Disp: , Rfl:  ?No Known Allergies ?Family History  ?Problem Relation Age of Onset  ? Diabetes Brother   ? Colon cancer Neg Hx   ? Esophageal cancer Neg Hx   ? Stomach cancer Neg Hx   ? Pancreatic cancer Neg Hx   ? Inflammatory bowel disease Neg Hx   ? Liver disease Neg Hx   ? Rectal cancer Neg Hx   ? Colon polyps Neg Hx   ? ?Social History  ? ?Socioeconomic History  ? Marital status: Married  ?  Spouse name: Not on file  ? Number of children: Not on file  ? Years of education: Not on file  ? Highest education level: Not on file  ?Occupational History  ? Not on file  ?Tobacco Use  ? Smoking status: Never  ? Smokeless tobacco: Never  ?Vaping Use  ? Vaping Use: Never used  ?Substance and Sexual Activity  ? Alcohol use: Never  ?  Comment: once in awhile  ? Drug use: Not Currently  ?  Types: Marijuana  ?  Comment: Use of THC tea 1-2 times per week  ? Sexual activity: Not on file  ?Other  Topics Concern  ? Not on file  ?Social History Narrative  ? Not on file  ? ?Social Determinants of Health  ? ?Financial Resource Strain: Not on file  ?Food Insecurity: Not on file  ?Transportation Needs: Not on file  ?Physical Activity: Not on file  ?Stress: Not on file  ?Social Connections: Not on file  ?Intimate Partner Violence: Not on file  ? ? ?Physical Exam: ?Today's Vitals  ? 09/13/21 1121 09/13/21 1122  ?BP: (!) 166/79   ?Pulse: 82   ?Temp: 98.4 ?F (36.9 ?C) 98.4 ?F (36.9 ?C)  ?SpO2: 100%   ?Weight: 144 lb (65.3 kg)   ?Height: 4\' 10"  (1.473 m)   ? ?Body mass index is 30.1 kg/m?. ?GEN: NAD ?EYE: Sclerae anicteric ?ENT: MMM ?CV: Non-tachycardic ?GI: Soft, NT/ND ?NEURO:  Alert & Oriented x 3 ? ?Lab Results: ?No results for input(s): WBC, HGB, HCT, PLT in the last 72  hours. ?BMET ?No results for input(s): NA, K, CL, CO2, GLUCOSE, BUN, CREATININE, CALCIUM in the last 72 hours. ?LFT ?No results for input(s): PROT, ALBUMIN, AST, ALT, ALKPHOS, BILITOT, BILIDIR, IBILI in the last 72 hours. ?PT/INR ?No results for input(s): LABPROT, INR in the last 72 hours. ? ? ?Impression / Plan: ?This is a 71 y.o.female who presents for EGD/Colonoscopy for gastric intestinal metaplasia follow up and gastric ulcer follow up and colon cancer screening and further evaluation of iron deficiency and anemia. ? ?The risks and benefits of endoscopic evaluation/treatment were discussed with the patient and/or family; these include but are not limited to the risk of perforation, infection, bleeding, missed lesions, lack of diagnosis, severe illness requiring hospitalization, as well as anesthesia and sedation related illnesses.  The patient's history has been reviewed, patient examined, no change in status, and deemed stable for procedure.  The patient and/or family is agreeable to proceed.  ? ? ?Justice Britain, MD ?[facility] ?Advanced Endoscopy ?Office # [phone] ? ?

## 2021-09-13 NOTE — Patient Instructions (Addendum)
Restart Nexium 40mg  by mouth twice daily. Prescription sent.  ?Start Ferrous gluconate 324mg  by mouth daily (Iron). Prescription sent .  ? ?Recommend a high-fiber diet (see handout). ?Use FiberCon 1-2 tablets by mouth daily.  ? ?My office will contact you to schedule a repeat EGD and colonoscopy.  ? ?USTED Lillia Abed PROCEDIMIENTO ENDOSC?PICO HOY EN EL [facility]:   Lea el informe del procedimiento que se le entreg? para cualquier pregunta espec?fica sobre lo que se encontr? Education administrator.  Si el informe del examen no responde a sus preguntas, por favor llame a su gastroenter?logo para aclararlo.  Si usted solicit? que no se le den [employer] de lo que se Estate manager/land agent? en su procedimiento al acompa?ante que le va a cuidar, entonces el informe del procedimiento se ha incluido en un sobre sellado para que usted lo revise despu?s cuando le sea m?s conveniente. ?  ?LO QUE PUEDE ESPERAR: Algunas sensaciones de hinchaz?n en el abdomen.  Puede tener m?s gases de lo normal.  El caminar puede ayudarle a eliminar el aire que se le puso en el tracto gastrointestinal durante el procedimiento y reducir la hinchaz?n.  Si le hicieron una endoscopia inferior (como una colonoscopia o una sigmoidoscopia flexible), podr?a notar manchas de sangre en las heces fecales o en el papel higi?nico.  Si se someti? a una preparaci?n intestinal para su procedimiento, es posible que no tenga una evacuaci?n intestinal normal durante algunos d?as. ?  ?Tenga en cuenta:  Es posible que note un poco de irritaci?n y congesti?n en la nariz o alg?n drenaje.  Esto es debido al ox?geno utilizado durante su procedimiento.  No hay que preocuparse y esto debe desaparecer m?s o menos en un d?a. ?  ?S?NTOMAS PARA REPORTAR INMEDIATAMENTE: ? Despu?s de una endoscopia inferior (colonoscopia o sigmoidoscopia flexible): ? Cantidades excesivas de sangre en las heces fecales ? Sensibilidad significativa o empeoramiento de los dolores abdominales  ? Hinchaz?n  aguda del abdomen que antes no ten?a  ? Fiebre de 100?F o m?s ?  ?Despu?s de la endoscopia superior (EGD) ? V?mitos de sangre o material como caf? molido  ? Dolor en el pecho o dolor debajo de los om?platos que antes no ten?a  ? Dolor o dificultad persistente para tragar ? Falta de aire que antes no ten?a  ? Fiebre de 100?F o m?s ? Heces fecales negras y pegajosas ?  ?Para asuntos urgentes o de emergencia, puede comunicarse con un gastroenter?logo a cualquier hora llamando al [phone]. ? ?DIETA:  Recomendamos una comida peque?a al principio, pero luego puede continuar con su dieta normal.  Tome muchos l?quidos, Teacher, adult education las bebidas alcoh?licas durante 24 horas.  ?  ?ACTIVIDAD:  Debe planear tomarse las cosas con calma por el resto del d?a y no debe CONDUCIR ni usar maquinaria pesada hasta ma?ana (debido a los medicamentos de sedaci?n Furniture conservator/restorer).   ?  ?SEGUIMIENTO: ?Teacher, music? al n?mero que aparece en su historial al siguiente d?a h?bil de su procedimiento para ver c?mo se siente y para responder cualquier pregunta o inquietud que pueda tener con respecto a la informaci?n que se le dio despu?s del procedimiento. Si no podemos contactarle, le dejaremos un mensaje.  Sin embargo, si se siente bien y no tiene ning?n problema, no es necesario que nos devuelva la llamada.  Asumiremos que ha regresado a sus actividades diarias normales sin incidentes. ?Si se le tomaron algunas biopsias, le contactaremos por tel?fono o por carta en  las pr?ximas 3 semanas.  Si no ha sabido [employer] biopsias en el transcurso de 3 semanas, por favor ll?menos al [phone].  ? ?FIRMAS/CONFIDENCIALIDAD: ?Usted y/o el acompa?ante que le cuide han firmado documentos que se ingresar?n en su historial m?dico electr?nico.  Estas firmas atestiguan el hecho de que la informaci?n anterior ? ? ? ? ?YOU HAD AN ENDOSCOPIC PROCEDURE TODAY AT [facility]:   Refer to the procedure report  that was given to you for any specific questions about what was found during the examination.  If the procedure report does not answer your questions, please call your gastroenterologist to clarify.  If you requested that your care partner not be given the details of your procedure findings, then the procedure report has been included in a sealed envelope for you to review at your convenience later. ? ?YOU SHOULD EXPECT: Some feelings of bloating in the abdomen. Passage of more gas than usual.  Walking can help get rid of the air that was put into your GI tract during the procedure and reduce the bloating. If you had a lower endoscopy (such as a colonoscopy or flexible sigmoidoscopy) you may notice spotting of blood in your stool or on the toilet paper. If you underwent a bowel prep for your procedure, you may not have a normal bowel movement for a few days. ? ?Please Note:  You might notice some irritation and congestion in your nose or some drainage.  This is from the oxygen used during your procedure.  There is no need for concern and it should clear up in a day or so. ? ?SYMPTOMS TO REPORT IMMEDIATELY: ? ?Following lower endoscopy (colonoscopy or flexible sigmoidoscopy): ? Excessive amounts of blood in the stool ? Significant tenderness or worsening of abdominal pains ? Swelling of the abdomen that is new, acute ? Fever of 100?F or higher ? ?Following upper endoscopy (EGD) ? Vomiting of blood or coffee ground material ? New chest pain or pain under the shoulder blades ? Painful or persistently difficult swallowing ? New shortness of breath ? Fever of 100?F or higher ? Black, tarry-looking stools ? ?For urgent or emergent issues, a gastroenterologist can be reached at any hour by calling [phone]. ?Do not use MyChart messaging for urgent concerns.  ? ? ?DIET:  We do recommend a small meal at first, but then you may proceed to your regular diet.  Drink plenty of fluids but you should avoid alcoholic  beverages for 24 hours. ? ?ACTIVITY:  You should plan to take it easy for the rest of today and you should NOT DRIVE or use heavy machinery until tomorrow (because of the sedation medicines used during the test).   ? ?FOLLOW UP: ?Our staff will call the number listed on your records 48-72 hours following your procedure to check on you and address any questions or concerns that you may have regarding the information given to you following your procedure. If we do not reach you, we will leave a message.  We will attempt to reach you two times.  During this call, we will ask if you have developed any symptoms of COVID 19. If you develop any symptoms (ie: fever, flu-like symptoms, shortness of breath, cough etc.) before then, please call [phone].  If you test positive for Covid 19 in the 2 weeks post procedure, please call and report this information to [place].   ? ?If any biopsies were taken you will be contacted by phone  or by letter within the next 1-3 weeks.  Please call us at [phone] if you have not heard about the biopsies in 3 weeks.  ? ? ?SIGNATURES/CONFIDENTIALITY: ?You and/or your care partner have signed paperwork which will be entered into your electronic medical record.  These signatures attest to the fact that that the information above on your After Visit Summary has been reviewed and is understood.  Full responsibility of the confidentiality of this discharge information lies with you and/or your care-partner. ?   ?

## 2021-09-13 NOTE — Op Note (Signed)
[facility] ?Patient Name: Stacey Chambers ?Procedure Date: 09/13/2021 12:27 PM ?MRN: [id number] ?Endoscopist: Justice Britain , MD ?Age: 71 ?Referring MD:  ?Date of Birth: 1951/06/13 ?Gender: Female ?Account #: [id number] ?Procedure:                Colonoscopy ?Indications:              Screening for colorectal malignant neoplasm,  ?                          Incidental - Iron deficiency anemia ?Medicines:                Monitored Anesthesia Care ?Procedure:                Pre-Anesthesia Assessment: ?                          - Prior to the procedure, a History and Physical  ?                          was performed, and patient medications and  ?                          allergies were reviewed. The patient's tolerance of  ?                          previous anesthesia was also reviewed. The risks  ?                          and benefits of the procedure and the sedation  ?                          options and risks were discussed with the patient.  ?                          All questions were answered, and informed consent  ?                          was obtained. Prior Anticoagulants: The patient has  ?                          taken no previous anticoagulant or antiplatelet  ?                          agents. ASA Grade Assessment: II - A patient with  ?                          mild systemic disease. After reviewing the risks  ?                          and benefits, the patient was deemed in  ?                          satisfactory condition to undergo the procedure. ?  After obtaining informed consent, the colonoscope  ?                          was passed under direct vision. Throughout the  ?                          procedure, the patient's blood pressure, pulse, and  ?                          oxygen saturations were monitored continuously. The  ?                          Olympus PCF-[id number] (#[id number]) Colonoscope was  ?                          introduced through the anus  and advanced to the the  ?                          cecum, identified by appendiceal orifice and  ?                          ileocecal valve. The colonoscopy was performed  ?                          without difficulty. The patient tolerated the  ?                          procedure. The quality of the bowel preparation was  ?                          inadequate. The ileocecal valve, appendiceal  ?                          orifice, and rectum were photographed. ?Scope In: 12:53:02 PM ?Scope Out: 1:01:47 PM ?Scope Withdrawal Time: 0 hours 5 minutes 16 seconds  ?Total Procedure Duration: 0 hours 8 minutes 45 seconds  ?Findings:                 The digital rectal exam findings include  ?                          hemorrhoids. Pertinent negatives include no  ?                          palpable rectal lesions. ?                          Extensive amounts of semi-solid and caked on wall  ?                          stool was found in the proximal transverse colon,  ?                          at the hepatic flexure, in the ascending colon and  ?  in the cecum, interfering with visualization.  ?                          Lavage of the area was performed using copious  ?                          amounts, resulting in incomplete clearance with  ?                          fair visualization. ?                          Multiple small-mouthed diverticula were found in  ?                          the recto-sigmoid colon and sigmoid colon. ?                          Normal mucosa was found in the rectum, in the  ?                          recto-sigmoid colon, in the sigmoid colon and in  ?                          the descending colon. ?                          Non-bleeding non-thrombosed external and internal  ?                          hemorrhoids were found during retroflexion, during  ?                          perianal exam and during digital exam. The  ?                          hemorrhoids were Grade  II (internal hemorrhoids  ?                          that prolapse but reduce spontaneously). ?Complications:            No immediate complications. ?Estimated Blood Loss:     Estimated blood loss: none. ?Impression:               - Preparation of the colon was inadequate. ?                          - Hemorrhoids found on digital rectal exam. ?                          - Stool in the proximal transverse colon, at the  ?                          hepatic flexure, in the ascending colon and in the  ?  cecum. ?                          - Diverticulosis in the recto-sigmoid colon and in  ?                          the sigmoid colon. ?                          - Normal mucosa in the rectum, in the recto-sigmoid  ?                          colon, in the sigmoid colon and in the descending  ?                          colon. ?                          - Non-bleeding non-thrombosed external and internal  ?                          hemorrhoids. ?Recommendation:           - The patient will be observed post-procedure,  ?                          until all discharge criteria are met. ?                          - Discharge patient to home. ?                          - Patient has a contact number available for  ?                          emergencies. The signs and symptoms of potential  ?                          delayed complications were discussed with the  ?                          patient. Return to normal activities tomorrow.  ?                          Written discharge instructions were provided to the  ?                          patient. ?                          - High fiber diet. ?                          - Use FiberCon 1-2 tablets PO daily. ?                          - Continue present medications. ?                          -  Repeat colonoscopy in 6-12 months because the  ?                          bowel preparation was poor. 2-day preparation will  ?                          be required.  Can consider doing again when repeat  ?                          EGD is completed to follow up GU that was found  ?                          again. I suspect her continued IDA is a result of  ?                          her gastric ulcer rather than a colon findings, but  ?                          to ensure true screening, it would be ideal to  ?                          complete her colonoscopy with a good preparation. ?                          - The findings and recommendations were discussed  ?                          with the patient. ?Justice Britain, MD ?09/13/2021 1:20:43 PM ?

## 2021-09-13 NOTE — Progress Notes (Signed)
I have reviewed the patient's medical history in detail and updated the computerized patient record. ? ? ?Interpreter used today at the [facility] for this pt.  Interpreter's name is-  Lockie Mola ? ?

## 2021-09-13 NOTE — Progress Notes (Signed)
Called to room to assist during endoscopic procedure.  Patient ID and intended procedure confirmed with present staff. Received instructions for my participation in the procedure from the performing physician.  

## 2021-09-15 ENCOUNTER — Telehealth: Payer: Self-pay

## 2021-09-15 NOTE — Telephone Encounter (Signed)
?  Follow up Call- ? ?Call back number 09/13/2021 03/17/2021  ?Post procedure Call Back phone  # [phone]  ?Permission to leave phone message Yes Yes  ?  ? ?Patient questions: ? ?Do you have a fever, pain , or abdominal swelling? No. ?Pain Score  0 * ? ?Have you tolerated food without any problems? Yes.   ? ?Have you been able to return to your normal activities? Yes.   ? ?Do you have any questions about your discharge instructions: ?Diet   No. ?Medications  No. ?Follow up visit  No. ? ?Do you have questions or concerns about your Care? No. ? ?Actions: ?* If pain score is 4 or above: ?No action needed, pain <4. ? ? ?

## 2021-09-15 NOTE — Telephone Encounter (Signed)
?  Follow up Call- ? ?Call back number 09/13/2021 03/17/2021  ?Post procedure Call Back phone  # [phone]  ?Permission to leave phone message Yes Yes  ?  ? ?No answer and voicemail has not been set up ?

## 2021-09-20 ENCOUNTER — Encounter: Payer: Self-pay | Admitting: Gastroenterology

## 2021-10-06 DIAGNOSIS — E1165 Type 2 diabetes mellitus with hyperglycemia: Secondary | ICD-10-CM | POA: Diagnosis not present

## 2021-10-06 DIAGNOSIS — R771 Abnormality of globulin: Secondary | ICD-10-CM | POA: Diagnosis not present

## 2021-10-06 DIAGNOSIS — N1831 Chronic kidney disease, stage 3a: Secondary | ICD-10-CM | POA: Diagnosis not present

## 2021-10-06 DIAGNOSIS — I10 Essential (primary) hypertension: Secondary | ICD-10-CM | POA: Diagnosis not present

## 2021-10-06 DIAGNOSIS — E782 Mixed hyperlipidemia: Secondary | ICD-10-CM | POA: Diagnosis not present

## 2021-10-14 DIAGNOSIS — Z981 Arthrodesis status: Secondary | ICD-10-CM | POA: Diagnosis not present

## 2021-10-20 DIAGNOSIS — E1165 Type 2 diabetes mellitus with hyperglycemia: Secondary | ICD-10-CM | POA: Diagnosis not present

## 2021-10-20 DIAGNOSIS — R771 Abnormality of globulin: Secondary | ICD-10-CM | POA: Diagnosis not present

## 2021-10-20 DIAGNOSIS — I10 Essential (primary) hypertension: Secondary | ICD-10-CM | POA: Diagnosis not present

## 2021-10-20 DIAGNOSIS — K5909 Other constipation: Secondary | ICD-10-CM | POA: Diagnosis not present

## 2021-10-20 DIAGNOSIS — J301 Allergic rhinitis due to pollen: Secondary | ICD-10-CM | POA: Diagnosis not present

## 2021-10-20 DIAGNOSIS — R5383 Other fatigue: Secondary | ICD-10-CM | POA: Diagnosis not present

## 2021-10-20 DIAGNOSIS — E782 Mixed hyperlipidemia: Secondary | ICD-10-CM | POA: Diagnosis not present

## 2021-10-20 DIAGNOSIS — N1831 Chronic kidney disease, stage 3a: Secondary | ICD-10-CM | POA: Diagnosis not present

## 2021-11-03 DIAGNOSIS — R771 Abnormality of globulin: Secondary | ICD-10-CM | POA: Diagnosis not present

## 2021-11-03 DIAGNOSIS — J301 Allergic rhinitis due to pollen: Secondary | ICD-10-CM | POA: Diagnosis not present

## 2021-11-03 DIAGNOSIS — E782 Mixed hyperlipidemia: Secondary | ICD-10-CM | POA: Diagnosis not present

## 2021-11-03 DIAGNOSIS — R5383 Other fatigue: Secondary | ICD-10-CM | POA: Diagnosis not present

## 2021-11-03 DIAGNOSIS — I10 Essential (primary) hypertension: Secondary | ICD-10-CM | POA: Diagnosis not present

## 2021-11-03 DIAGNOSIS — K5909 Other constipation: Secondary | ICD-10-CM | POA: Diagnosis not present

## 2021-11-03 DIAGNOSIS — E038 Other specified hypothyroidism: Secondary | ICD-10-CM | POA: Diagnosis not present

## 2021-11-03 DIAGNOSIS — E1165 Type 2 diabetes mellitus with hyperglycemia: Secondary | ICD-10-CM | POA: Diagnosis not present

## 2021-11-03 DIAGNOSIS — D508 Other iron deficiency anemias: Secondary | ICD-10-CM | POA: Diagnosis not present

## 2021-11-03 DIAGNOSIS — N1831 Chronic kidney disease, stage 3a: Secondary | ICD-10-CM | POA: Diagnosis not present

## 2021-12-01 ENCOUNTER — Emergency Department (HOSPITAL_COMMUNITY): Payer: Medicare Other

## 2021-12-01 ENCOUNTER — Other Ambulatory Visit: Payer: Self-pay

## 2021-12-01 ENCOUNTER — Inpatient Hospital Stay (HOSPITAL_COMMUNITY)
Admission: EM | Admit: 2021-12-01 | Discharge: 2021-12-27 | DRG: 264 | Disposition: A | Discharge status: Home / Self Care | Payer: Medicare Other | Attending: Internal Medicine | Admitting: Internal Medicine

## 2021-12-01 ENCOUNTER — Inpatient Hospital Stay (HOSPITAL_COMMUNITY): Payer: Medicare Other

## 2021-12-01 ENCOUNTER — Encounter (HOSPITAL_COMMUNITY): Payer: Self-pay

## 2021-12-01 DIAGNOSIS — K257 Chronic gastric ulcer without hemorrhage or perforation: Secondary | ICD-10-CM | POA: Diagnosis not present

## 2021-12-01 DIAGNOSIS — R760 Raised antibody titer: Secondary | ICD-10-CM | POA: Diagnosis not present

## 2021-12-01 DIAGNOSIS — E039 Hypothyroidism, unspecified: Secondary | ICD-10-CM | POA: Diagnosis present

## 2021-12-01 DIAGNOSIS — I10 Essential (primary) hypertension: Principal | ICD-10-CM

## 2021-12-01 DIAGNOSIS — K760 Fatty (change of) liver, not elsewhere classified: Secondary | ICD-10-CM | POA: Diagnosis present

## 2021-12-01 DIAGNOSIS — R7989 Other specified abnormal findings of blood chemistry: Secondary | ICD-10-CM | POA: Diagnosis not present

## 2021-12-01 DIAGNOSIS — J301 Allergic rhinitis due to pollen: Secondary | ICD-10-CM | POA: Diagnosis not present

## 2021-12-01 DIAGNOSIS — D631 Anemia in chronic kidney disease: Secondary | ICD-10-CM | POA: Diagnosis present

## 2021-12-01 DIAGNOSIS — K259 Gastric ulcer, unspecified as acute or chronic, without hemorrhage or perforation: Secondary | ICD-10-CM | POA: Diagnosis not present

## 2021-12-01 DIAGNOSIS — E559 Vitamin D deficiency, unspecified: Secondary | ICD-10-CM | POA: Diagnosis not present

## 2021-12-01 DIAGNOSIS — K8020 Calculus of gallbladder without cholecystitis without obstruction: Secondary | ICD-10-CM | POA: Diagnosis present

## 2021-12-01 DIAGNOSIS — M25562 Pain in left knee: Secondary | ICD-10-CM | POA: Diagnosis not present

## 2021-12-01 DIAGNOSIS — R040 Epistaxis: Secondary | ICD-10-CM | POA: Diagnosis not present

## 2021-12-01 DIAGNOSIS — E871 Hypo-osmolality and hyponatremia: Secondary | ICD-10-CM | POA: Diagnosis present

## 2021-12-01 DIAGNOSIS — N184 Chronic kidney disease, stage 4 (severe): Secondary | ICD-10-CM | POA: Diagnosis not present

## 2021-12-01 DIAGNOSIS — U071 COVID-19: Secondary | ICD-10-CM | POA: Diagnosis present

## 2021-12-01 DIAGNOSIS — I509 Heart failure, unspecified: Secondary | ICD-10-CM | POA: Diagnosis not present

## 2021-12-01 DIAGNOSIS — D6959 Other secondary thrombocytopenia: Secondary | ICD-10-CM | POA: Diagnosis not present

## 2021-12-01 DIAGNOSIS — N186 End stage renal disease: Secondary | ICD-10-CM | POA: Diagnosis not present

## 2021-12-01 DIAGNOSIS — K219 Gastro-esophageal reflux disease without esophagitis: Secondary | ICD-10-CM | POA: Diagnosis present

## 2021-12-01 DIAGNOSIS — J90 Pleural effusion, not elsewhere classified: Secondary | ICD-10-CM | POA: Diagnosis not present

## 2021-12-01 DIAGNOSIS — Z608 Other problems related to social environment: Secondary | ICD-10-CM | POA: Diagnosis present

## 2021-12-01 DIAGNOSIS — K7469 Other cirrhosis of liver: Secondary | ICD-10-CM | POA: Diagnosis not present

## 2021-12-01 DIAGNOSIS — K7460 Unspecified cirrhosis of liver: Secondary | ICD-10-CM | POA: Diagnosis not present

## 2021-12-01 DIAGNOSIS — R601 Generalized edema: Secondary | ICD-10-CM | POA: Diagnosis not present

## 2021-12-01 DIAGNOSIS — N2581 Secondary hyperparathyroidism of renal origin: Secondary | ICD-10-CM | POA: Diagnosis not present

## 2021-12-01 DIAGNOSIS — Z7984 Long term (current) use of oral hypoglycemic drugs: Secondary | ICD-10-CM

## 2021-12-01 DIAGNOSIS — N271 Small kidney, bilateral: Secondary | ICD-10-CM | POA: Diagnosis present

## 2021-12-01 DIAGNOSIS — R0602 Shortness of breath: Secondary | ICD-10-CM | POA: Diagnosis not present

## 2021-12-01 DIAGNOSIS — N189 Chronic kidney disease, unspecified: Secondary | ICD-10-CM | POA: Diagnosis not present

## 2021-12-01 DIAGNOSIS — K769 Liver disease, unspecified: Secondary | ICD-10-CM | POA: Diagnosis not present

## 2021-12-01 DIAGNOSIS — R188 Other ascites: Secondary | ICD-10-CM | POA: Diagnosis not present

## 2021-12-01 DIAGNOSIS — M7989 Other specified soft tissue disorders: Secondary | ICD-10-CM | POA: Diagnosis present

## 2021-12-01 DIAGNOSIS — R0609 Other forms of dyspnea: Secondary | ICD-10-CM | POA: Diagnosis present

## 2021-12-01 DIAGNOSIS — D649 Anemia, unspecified: Secondary | ICD-10-CM | POA: Diagnosis present

## 2021-12-01 DIAGNOSIS — E8770 Fluid overload, unspecified: Secondary | ICD-10-CM

## 2021-12-01 DIAGNOSIS — D684 Acquired coagulation factor deficiency: Secondary | ICD-10-CM | POA: Diagnosis present

## 2021-12-01 DIAGNOSIS — N289 Disorder of kidney and ureter, unspecified: Secondary | ICD-10-CM | POA: Diagnosis not present

## 2021-12-01 DIAGNOSIS — R161 Splenomegaly, not elsewhere classified: Secondary | ICD-10-CM | POA: Diagnosis present

## 2021-12-01 DIAGNOSIS — I5031 Acute diastolic (congestive) heart failure: Secondary | ICD-10-CM | POA: Diagnosis present

## 2021-12-01 DIAGNOSIS — R771 Abnormality of globulin: Secondary | ICD-10-CM | POA: Diagnosis not present

## 2021-12-01 DIAGNOSIS — N179 Acute kidney failure, unspecified: Secondary | ICD-10-CM | POA: Diagnosis present

## 2021-12-01 DIAGNOSIS — Z79899 Other long term (current) drug therapy: Secondary | ICD-10-CM

## 2021-12-01 DIAGNOSIS — E118 Type 2 diabetes mellitus with unspecified complications: Secondary | ICD-10-CM | POA: Diagnosis not present

## 2021-12-01 DIAGNOSIS — N049 Nephrotic syndrome with unspecified morphologic changes: Secondary | ICD-10-CM

## 2021-12-01 DIAGNOSIS — R8281 Pyuria: Secondary | ICD-10-CM | POA: Diagnosis present

## 2021-12-01 DIAGNOSIS — K5730 Diverticulosis of large intestine without perforation or abscess without bleeding: Secondary | ICD-10-CM | POA: Diagnosis present

## 2021-12-01 DIAGNOSIS — K7031 Alcoholic cirrhosis of liver with ascites: Secondary | ICD-10-CM | POA: Diagnosis not present

## 2021-12-01 DIAGNOSIS — E669 Obesity, unspecified: Secondary | ICD-10-CM | POA: Diagnosis present

## 2021-12-01 DIAGNOSIS — Z862 Personal history of diseases of the blood and blood-forming organs and certain disorders involving the immune mechanism: Secondary | ICD-10-CM | POA: Diagnosis not present

## 2021-12-01 DIAGNOSIS — E1122 Type 2 diabetes mellitus with diabetic chronic kidney disease: Secondary | ICD-10-CM | POA: Diagnosis present

## 2021-12-01 DIAGNOSIS — I160 Hypertensive urgency: Secondary | ICD-10-CM | POA: Diagnosis not present

## 2021-12-01 DIAGNOSIS — R770 Abnormality of albumin: Secondary | ICD-10-CM | POA: Diagnosis present

## 2021-12-01 DIAGNOSIS — Z8711 Personal history of peptic ulcer disease: Secondary | ICD-10-CM

## 2021-12-01 DIAGNOSIS — E785 Hyperlipidemia, unspecified: Secondary | ICD-10-CM | POA: Diagnosis present

## 2021-12-01 DIAGNOSIS — I132 Hypertensive heart and chronic kidney disease with heart failure and with stage 5 chronic kidney disease, or end stage renal disease: Secondary | ICD-10-CM | POA: Diagnosis not present

## 2021-12-01 DIAGNOSIS — K7030 Alcoholic cirrhosis of liver without ascites: Secondary | ICD-10-CM | POA: Diagnosis not present

## 2021-12-01 DIAGNOSIS — K766 Portal hypertension: Secondary | ICD-10-CM | POA: Diagnosis not present

## 2021-12-01 DIAGNOSIS — E8809 Other disorders of plasma-protein metabolism, not elsewhere classified: Secondary | ICD-10-CM | POA: Diagnosis not present

## 2021-12-01 DIAGNOSIS — Z638 Other specified problems related to primary support group: Secondary | ICD-10-CM

## 2021-12-01 DIAGNOSIS — R600 Localized edema: Secondary | ICD-10-CM | POA: Diagnosis not present

## 2021-12-01 DIAGNOSIS — R531 Weakness: Secondary | ICD-10-CM | POA: Diagnosis not present

## 2021-12-01 DIAGNOSIS — M4856XA Collapsed vertebra, not elsewhere classified, lumbar region, initial encounter for fracture: Secondary | ICD-10-CM | POA: Diagnosis not present

## 2021-12-01 DIAGNOSIS — R5383 Other fatigue: Secondary | ICD-10-CM | POA: Diagnosis not present

## 2021-12-01 DIAGNOSIS — R34 Anuria and oliguria: Secondary | ICD-10-CM | POA: Diagnosis not present

## 2021-12-01 DIAGNOSIS — D696 Thrombocytopenia, unspecified: Secondary | ICD-10-CM | POA: Diagnosis present

## 2021-12-01 DIAGNOSIS — E872 Acidosis, unspecified: Secondary | ICD-10-CM | POA: Diagnosis present

## 2021-12-01 DIAGNOSIS — Z6831 Body mass index (BMI) 31.0-31.9, adult: Secondary | ICD-10-CM

## 2021-12-01 DIAGNOSIS — D508 Other iron deficiency anemias: Secondary | ICD-10-CM | POA: Diagnosis not present

## 2021-12-01 DIAGNOSIS — N170 Acute kidney failure with tubular necrosis: Secondary | ICD-10-CM | POA: Diagnosis present

## 2021-12-01 DIAGNOSIS — I959 Hypotension, unspecified: Secondary | ICD-10-CM | POA: Diagnosis present

## 2021-12-01 DIAGNOSIS — I129 Hypertensive chronic kidney disease with stage 1 through stage 4 chronic kidney disease, or unspecified chronic kidney disease: Secondary | ICD-10-CM | POA: Diagnosis not present

## 2021-12-01 DIAGNOSIS — I130 Hypertensive heart and chronic kidney disease with heart failure and stage 1 through stage 4 chronic kidney disease, or unspecified chronic kidney disease: Secondary | ICD-10-CM | POA: Diagnosis not present

## 2021-12-01 DIAGNOSIS — Z992 Dependence on renal dialysis: Secondary | ICD-10-CM | POA: Diagnosis not present

## 2021-12-01 DIAGNOSIS — Z833 Family history of diabetes mellitus: Secondary | ICD-10-CM

## 2021-12-01 DIAGNOSIS — R109 Unspecified abdominal pain: Secondary | ICD-10-CM | POA: Diagnosis not present

## 2021-12-01 DIAGNOSIS — Z66 Do not resuscitate: Secondary | ICD-10-CM | POA: Diagnosis not present

## 2021-12-01 DIAGNOSIS — R748 Abnormal levels of other serum enzymes: Secondary | ICD-10-CM | POA: Diagnosis not present

## 2021-12-01 DIAGNOSIS — Z4901 Encounter for fitting and adjustment of extracorporeal dialysis catheter: Secondary | ICD-10-CM | POA: Diagnosis not present

## 2021-12-01 DIAGNOSIS — Z515 Encounter for palliative care: Secondary | ICD-10-CM

## 2021-12-01 DIAGNOSIS — E119 Type 2 diabetes mellitus without complications: Secondary | ICD-10-CM | POA: Diagnosis present

## 2021-12-01 DIAGNOSIS — E038 Other specified hypothyroidism: Secondary | ICD-10-CM | POA: Diagnosis not present

## 2021-12-01 DIAGNOSIS — Z7189 Other specified counseling: Secondary | ICD-10-CM | POA: Diagnosis not present

## 2021-12-01 DIAGNOSIS — Z79890 Hormone replacement therapy (postmenopausal): Secondary | ICD-10-CM

## 2021-12-01 DIAGNOSIS — K7290 Hepatic failure, unspecified without coma: Secondary | ICD-10-CM | POA: Diagnosis not present

## 2021-12-01 DIAGNOSIS — D509 Iron deficiency anemia, unspecified: Secondary | ICD-10-CM | POA: Diagnosis present

## 2021-12-01 DIAGNOSIS — R945 Abnormal results of liver function studies: Secondary | ICD-10-CM | POA: Diagnosis not present

## 2021-12-01 DIAGNOSIS — N1831 Chronic kidney disease, stage 3a: Secondary | ICD-10-CM | POA: Diagnosis not present

## 2021-12-01 DIAGNOSIS — N1832 Acute kidney failure, unspecified: Secondary | ICD-10-CM | POA: Diagnosis present

## 2021-12-01 DIAGNOSIS — E782 Mixed hyperlipidemia: Secondary | ICD-10-CM | POA: Diagnosis not present

## 2021-12-01 DIAGNOSIS — K5909 Other constipation: Secondary | ICD-10-CM | POA: Diagnosis not present

## 2021-12-01 DIAGNOSIS — E1165 Type 2 diabetes mellitus with hyperglycemia: Secondary | ICD-10-CM | POA: Diagnosis not present

## 2021-12-01 LAB — URINALYSIS, ROUTINE W REFLEX MICROSCOPIC
Bacteria, UA: NONE SEEN
Bilirubin Urine: NEGATIVE
Glucose, UA: NEGATIVE mg/dL
Ketones, ur: NEGATIVE mg/dL
Leukocytes,Ua: NEGATIVE
Nitrite: NEGATIVE
Protein, ur: >=300 mg/dL — AB
RBC / HPF: >50 RBC/hpf — ABNORMAL HIGH (ref 0–5)
Specific Gravity, Urine: 1.008 (ref 1.005–1.030)
pH: 6 (ref 5.0–8.0)

## 2021-12-01 LAB — CBC WITH DIFFERENTIAL/PLATELET
Abs Immature Granulocytes: 0.01 10*3/uL (ref 0.00–0.07)
Basophils Absolute: 0 10*3/uL (ref 0.0–0.1)
Basophils Relative: 1 %
Eosinophils Absolute: 0.1 10*3/uL (ref 0.0–0.5)
Eosinophils Relative: 2 %
HCT: 31.3 % — ABNORMAL LOW (ref 36.0–46.0)
Hemoglobin: 10.2 g/dL — ABNORMAL LOW (ref 12.0–15.0)
Immature Granulocytes: 0 %
Lymphocytes Relative: 11 %
Lymphs Abs: 0.5 10*3/uL — ABNORMAL LOW (ref 0.7–4.0)
MCH: 31.1 pg (ref 26.0–34.0)
MCHC: 32.6 g/dL (ref 30.0–36.0)
MCV: 95.4 fL (ref 80.0–100.0)
Monocytes Absolute: 0.4 10*3/uL (ref 0.1–1.0)
Monocytes Relative: 10 %
Neutro Abs: 3.3 10*3/uL (ref 1.7–7.7)
Neutrophils Relative %: 76 %
Platelets: 109 10*3/uL — ABNORMAL LOW (ref 150–400)
RBC: 3.28 MIL/uL — ABNORMAL LOW (ref 3.87–5.11)
RDW: 13.7 % (ref 11.5–15.5)
WBC: 4.3 10*3/uL (ref 4.0–10.5)
nRBC: 0 % (ref 0.0–0.2)

## 2021-12-01 LAB — GLUCOSE, CAPILLARY: Glucose-Capillary: 242 mg/dL — ABNORMAL HIGH (ref 70–99)

## 2021-12-01 LAB — BRAIN NATRIURETIC PEPTIDE: B Natriuretic Peptide: 715.8 pg/mL — ABNORMAL HIGH (ref 0.0–100.0)

## 2021-12-01 LAB — COMPREHENSIVE METABOLIC PANEL
ALT: 21 U/L (ref 0–44)
AST: 43 U/L — ABNORMAL HIGH (ref 15–41)
Albumin: 2.8 g/dL — ABNORMAL LOW (ref 3.5–5.0)
Alkaline Phosphatase: 142 U/L — ABNORMAL HIGH (ref 38–126)
Anion gap: 9 (ref 5–15)
BUN: 46 mg/dL — ABNORMAL HIGH (ref 8–23)
CO2: 18 mmol/L — ABNORMAL LOW (ref 22–32)
Calcium: 8.5 mg/dL — ABNORMAL LOW (ref 8.9–10.3)
Chloride: 109 mmol/L (ref 98–111)
Creatinine, Ser: 2.93 mg/dL — ABNORMAL HIGH (ref 0.44–1.00)
GFR, Estimated: 17 mL/min — ABNORMAL LOW (ref >60)
Glucose, Bld: 125 mg/dL — ABNORMAL HIGH (ref 70–99)
Potassium: 4.1 mmol/L (ref 3.5–5.1)
Sodium: 136 mmol/L (ref 135–145)
Total Bilirubin: 1 mg/dL (ref 0.3–1.2)
Total Protein: 7.3 g/dL (ref 6.5–8.1)

## 2021-12-01 LAB — LIPASE, BLOOD: Lipase: 94 U/L — ABNORMAL HIGH (ref 11–51)

## 2021-12-01 LAB — PROTEIN, PLEURAL OR PERITONEAL FLUID: Total protein, fluid: <3 g/dL

## 2021-12-01 LAB — D-DIMER, QUANTITATIVE: D-Dimer, Quant: 5.88 ug/mL-FEU — ABNORMAL HIGH (ref 0.00–0.50)

## 2021-12-01 LAB — LACTATE DEHYDROGENASE, PLEURAL OR PERITONEAL FLUID: LD, Fluid: 42 U/L — ABNORMAL HIGH (ref 3–23)

## 2021-12-01 LAB — SARS CORONAVIRUS 2 BY RT PCR: SARS Coronavirus 2 by RT PCR: POSITIVE — AB

## 2021-12-01 LAB — PROCALCITONIN: Procalcitonin: <0.1 ng/mL

## 2021-12-01 LAB — AMMONIA: Ammonia: 20 umol/L (ref 9–35)

## 2021-12-01 LAB — RAPID URINE DRUG SCREEN, HOSP PERFORMED
Amphetamines: NOT DETECTED
Barbiturates: NOT DETECTED
Benzodiazepines: NOT DETECTED
Cocaine: NOT DETECTED
Opiates: NOT DETECTED
Tetrahydrocannabinol: NOT DETECTED

## 2021-12-01 LAB — HIV ANTIBODY (ROUTINE TESTING W REFLEX): HIV Screen 4th Generation wRfx: NONREACTIVE

## 2021-12-01 LAB — ALBUMIN, PLEURAL OR PERITONEAL FLUID: Albumin, Fluid: <1.5 g/dL

## 2021-12-01 LAB — BODY FLUID CELL COUNT WITH DIFFERENTIAL
Lymphs, Fluid: 59 %
Monocyte-Macrophage-Serous Fluid: 35 % — ABNORMAL LOW (ref 50–90)
Neutrophil Count, Fluid: 6 % (ref 0–25)
Total Nucleated Cell Count, Fluid: 361 cu mm (ref 0–1000)

## 2021-12-01 LAB — GRAM STAIN: Gram Stain: NONE SEEN

## 2021-12-01 LAB — FIBRINOGEN: Fibrinogen: 371 mg/dL (ref 210–475)

## 2021-12-01 LAB — C-REACTIVE PROTEIN: CRP: 0.9 mg/dL (ref <1.0)

## 2021-12-01 LAB — TROPONIN I (HIGH SENSITIVITY)
Troponin I (High Sensitivity): 10 ng/L (ref <18)
Troponin I (High Sensitivity): 10 ng/L (ref <18)

## 2021-12-01 LAB — AMYLASE, PLEURAL OR PERITONEAL FLUID: Amylase, Fluid: 60 U/L

## 2021-12-01 LAB — FERRITIN: Ferritin: 27 ng/mL (ref 11–307)

## 2021-12-01 LAB — PROTIME-INR
INR: 1.4 — ABNORMAL HIGH (ref 0.8–1.2)
Prothrombin Time: 17.4 seconds — ABNORMAL HIGH (ref 11.4–15.2)

## 2021-12-01 MED ORDER — METHYLPREDNISOLONE SODIUM SUCC 40 MG IJ SOLR
0.5000 mg/kg | Freq: Two times a day (BID) | INTRAMUSCULAR | Status: DC
  Administered 2021-12-01 – 2021-12-02 (×2): 32.4 mg via INTRAVENOUS
  Filled 2021-12-01 (×2): qty 1

## 2021-12-01 MED ORDER — ZINC SULFATE 220 (50 ZN) MG PO CAPS
220.0000 mg | ORAL_CAPSULE | Freq: Every day | ORAL | Status: DC
  Administered 2021-12-01 – 2021-12-26 (×24): 220 mg via ORAL
  Filled 2021-12-01 (×24): qty 1

## 2021-12-01 MED ORDER — HYDRALAZINE HCL 20 MG/ML IJ SOLN
10.0000 mg | Freq: Four times a day (QID) | INTRAMUSCULAR | Status: DC | PRN
  Administered 2021-12-01 – 2021-12-03 (×2): 10 mg via INTRAVENOUS
  Filled 2021-12-01 (×2): qty 1

## 2021-12-01 MED ORDER — INSULIN ASPART 100 UNIT/ML IJ SOLN
0.0000 [IU] | Freq: Every day | INTRAMUSCULAR | Status: DC
  Administered 2021-12-01: 2 [IU] via SUBCUTANEOUS

## 2021-12-01 MED ORDER — ACETAMINOPHEN 650 MG RE SUPP: 650.0000 mg | Freq: Four times a day (QID) | RECTAL | Status: DC | PRN

## 2021-12-01 MED ORDER — ASCORBIC ACID 500 MG PO TABS
500.0000 mg | ORAL_TABLET | Freq: Every day | ORAL | Status: DC
  Administered 2021-12-01 – 2021-12-27 (×25): 500 mg via ORAL
  Filled 2021-12-01 (×25): qty 1

## 2021-12-01 MED ORDER — PANTOPRAZOLE SODIUM 40 MG PO TBEC
40.0000 mg | DELAYED_RELEASE_TABLET | Freq: Two times a day (BID) | ORAL | Status: DC
  Administered 2021-12-01 – 2021-12-26 (×48): 40 mg via ORAL
  Filled 2021-12-01 (×48): qty 1

## 2021-12-01 MED ORDER — ACETAMINOPHEN 325 MG PO TABS
650.0000 mg | ORAL_TABLET | Freq: Four times a day (QID) | ORAL | Status: DC | PRN
  Administered 2021-12-06 – 2021-12-27 (×13): 650 mg via ORAL
  Filled 2021-12-01 (×13): qty 2

## 2021-12-01 MED ORDER — HYDROCOD POLI-CHLORPHE POLI ER 10-8 MG/5ML PO SUER: 5.0000 mL | Freq: Two times a day (BID) | ORAL | Status: DC | PRN

## 2021-12-01 MED ORDER — METOPROLOL SUCCINATE ER 50 MG PO TB24
50.0000 mg | ORAL_TABLET | Freq: Every day | ORAL | Status: DC
  Administered 2021-12-01 – 2021-12-26 (×21): 50 mg via ORAL
  Filled 2021-12-01 (×24): qty 1

## 2021-12-01 MED ORDER — ONDANSETRON HCL 4 MG/2ML IJ SOLN
4.0000 mg | Freq: Four times a day (QID) | INTRAMUSCULAR | Status: DC | PRN
  Administered 2021-12-17 – 2021-12-18 (×2): 4 mg via INTRAVENOUS
  Filled 2021-12-01 (×2): qty 2

## 2021-12-01 MED ORDER — THIAMINE HCL 100 MG PO TABS
100.0000 mg | ORAL_TABLET | Freq: Every day | ORAL | Status: DC
  Administered 2021-12-01 – 2021-12-26 (×24): 100 mg via ORAL
  Filled 2021-12-01 (×24): qty 1

## 2021-12-01 MED ORDER — AMLODIPINE BESYLATE 5 MG PO TABS
2.5000 mg | ORAL_TABLET | Freq: Every day | ORAL | Status: DC
  Administered 2021-12-01 – 2021-12-08 (×8): 2.5 mg via ORAL
  Filled 2021-12-01 (×8): qty 1

## 2021-12-01 MED ORDER — FUROSEMIDE 10 MG/ML IJ SOLN
20.0000 mg | Freq: Once | INTRAMUSCULAR | Status: AC
  Administered 2021-12-01: 20 mg via INTRAVENOUS
  Filled 2021-12-01: qty 2

## 2021-12-01 MED ORDER — LIDOCAINE HCL 1 % IJ SOLN
INTRAMUSCULAR | Status: AC
  Administered 2021-12-01: 15 mL
  Filled 2021-12-01: qty 20

## 2021-12-01 MED ORDER — IPRATROPIUM-ALBUTEROL 20-100 MCG/ACT IN AERS
1.0000 | INHALATION_SPRAY | Freq: Four times a day (QID) | RESPIRATORY_TRACT | Status: DC
  Administered 2021-12-02: 1 via RESPIRATORY_TRACT
  Filled 2021-12-01: qty 4

## 2021-12-01 MED ORDER — OXYCODONE HCL 5 MG PO TABS
5.0000 mg | ORAL_TABLET | ORAL | Status: DC | PRN
  Administered 2021-12-16 – 2021-12-26 (×6): 5 mg via ORAL
  Filled 2021-12-01 (×8): qty 1

## 2021-12-01 MED ORDER — ONDANSETRON HCL 4 MG PO TABS: 4.0000 mg | ORAL_TABLET | Freq: Four times a day (QID) | ORAL | Status: DC | PRN

## 2021-12-01 MED ORDER — HEPARIN SODIUM (PORCINE) 5000 UNIT/ML IJ SOLN
5000.0000 [IU] | Freq: Three times a day (TID) | INTRAMUSCULAR | Status: AC
  Administered 2021-12-01 – 2021-12-08 (×22): 5000 [IU] via SUBCUTANEOUS
  Filled 2021-12-01 (×21): qty 1

## 2021-12-01 MED ORDER — PANTOPRAZOLE SODIUM 40 MG PO TBEC: 40.0000 mg | DELAYED_RELEASE_TABLET | Freq: Every day | ORAL | Status: DC

## 2021-12-01 MED ORDER — GUAIFENESIN-DM 100-10 MG/5ML PO SYRP: 10.0000 mL | ORAL_SOLUTION | ORAL | Status: DC | PRN

## 2021-12-01 MED ORDER — LEVOTHYROXINE SODIUM 25 MCG PO TABS
25.0000 ug | ORAL_TABLET | Freq: Every day | ORAL | Status: DC
  Administered 2021-12-02 – 2021-12-26 (×21): 25 ug via ORAL
  Filled 2021-12-01 (×24): qty 1

## 2021-12-01 MED ORDER — FUROSEMIDE 10 MG/ML IJ SOLN
20.0000 mg | Freq: Once | INTRAMUSCULAR | Status: AC
  Administered 2021-12-01: 20 mg via INTRAVENOUS
  Filled 2021-12-01: qty 4

## 2021-12-01 MED ORDER — PREDNISONE 50 MG PO TABS: 50.0000 mg | ORAL_TABLET | Freq: Every day | ORAL | Status: DC

## 2021-12-01 MED ORDER — INSULIN ASPART 100 UNIT/ML IJ SOLN
0.0000 [IU] | Freq: Three times a day (TID) | INTRAMUSCULAR | Status: DC
  Administered 2021-12-02 (×3): 3 [IU] via SUBCUTANEOUS
  Administered 2021-12-03 – 2021-12-15 (×12): 2 [IU] via SUBCUTANEOUS
  Administered 2021-12-15 – 2021-12-20 (×4): 3 [IU] via SUBCUTANEOUS
  Administered 2021-12-23: 5 [IU] via SUBCUTANEOUS
  Administered 2021-12-24: 2 [IU] via SUBCUTANEOUS
  Administered 2021-12-24: 3 [IU] via SUBCUTANEOUS
  Administered 2021-12-26: 2 [IU] via SUBCUTANEOUS
  Administered 2021-12-26: 3 [IU] via SUBCUTANEOUS

## 2021-12-01 MED ORDER — ADULT MULTIVITAMIN W/MINERALS CH
1.0000 | ORAL_TABLET | Freq: Every day | ORAL | Status: DC
  Administered 2021-12-01 – 2021-12-26 (×22): 1 via ORAL
  Filled 2021-12-01 (×23): qty 1

## 2021-12-01 MED ORDER — MOLNUPIRAVIR EUA 200MG CAPSULE
4.0000 | ORAL_CAPSULE | Freq: Two times a day (BID) | ORAL | Status: AC
  Administered 2021-12-01 – 2021-12-06 (×10): 800 mg via ORAL
  Filled 2021-12-01: qty 4

## 2021-12-01 MED ORDER — DOCUSATE SODIUM 100 MG PO CAPS
100.0000 mg | ORAL_CAPSULE | Freq: Two times a day (BID) | ORAL | Status: DC
  Administered 2021-12-02 – 2021-12-27 (×43): 100 mg via ORAL
  Filled 2021-12-01 (×47): qty 1

## 2021-12-01 MED ORDER — POLYETHYLENE GLYCOL 3350 17 G PO PACK
17.0000 g | PACK | Freq: Every day | ORAL | Status: DC | PRN
  Filled 2021-12-01: qty 1

## 2021-12-01 MED ORDER — FOLIC ACID 1 MG PO TABS
1.0000 mg | ORAL_TABLET | Freq: Every day | ORAL | Status: DC
  Administered 2021-12-01 – 2021-12-27 (×25): 1 mg via ORAL
  Filled 2021-12-01 (×25): qty 1

## 2021-12-01 MED ORDER — FLUTICASONE PROPIONATE 50 MCG/ACT NA SUSP
1.0000 | Freq: Every day | NASAL | Status: DC | PRN
  Filled 2021-12-01 (×2): qty 16

## 2021-12-01 NOTE — ED Triage Notes (Signed)
Pt arrived via POV, c/o worsening HTN, and bilateral leg swelling for several days. C/o some SOB and increasing fatigue. States she is on BP meds, but dr told her to not take them today until they found out what was going on.

## 2021-12-01 NOTE — Procedures (Signed)
Ultrasound-guided diagnostic paracentesis performed yielding 180 cc of hazy, yellow fluid. No immediate complications. The fluid was sent to the lab for preordered studies. EBL none.

## 2021-12-01 NOTE — Plan of Care (Signed)

## 2021-12-01 NOTE — Progress Notes (Signed)
   12/01/21 1826  Assess: MEWS Score  Temp 98.9 F (37.2 C)  BP (!) 212/91  MAP (mmHg) 125  Pulse Rate 88  Resp 19  Level of Consciousness Alert  SpO2 100 %  O2 Device Room Air  Assess: MEWS Score  MEWS Temp 0  MEWS Systolic 2  MEWS Pulse 0  MEWS RR 0  MEWS LOC 0  MEWS Score 2  MEWS Score Color Yellow  Assess: if the MEWS score is Yellow or Red  Were vital signs taken at a resting state? Yes  Focused Assessment No change from prior assessment  Does the patient meet 2 or more of the SIRS criteria? No  MEWS guidelines implemented *See Row Information* Yes  Treat  MEWS Interventions Administered scheduled meds/treatments;Other (Comment) (Dr. Chana Bode made aware)  Pain Scale 0-10  Pain Score 3  Pain Type Acute pain  Pain Location Abdomen  Pain Orientation Mid  Pain Radiating Towards not applicable  Pain Descriptors / Indicators Aching  Pain Frequency Intermittent  Pain Onset On-going  Patients Stated Pain Goal 0  Pain Intervention(s) Repositioned;Rest  Multiple Pain Sites No  Take Vital Signs  Increase Vital Sign Frequency  Yellow: Q 2hr X 2 then Q 4hr X 2, if remains yellow, continue Q 4hrs  Escalate  MEWS: Escalate Yellow: discuss with charge nurse/RN and consider discussing with provider and RRT  Notify: Charge Nurse/RN  Name of Charge Nurse/RN Notified Jarrett Soho RN  Date Charge Nurse/RN Notified 12/01/21  Time Charge Nurse/RN Notified [place]  Notify: Provider  Provider Name/Title Dr. Raiford Noble  Date Provider Notified 12/01/21  Time Provider Notified 1828  Method of Notification Call  Notification Reason Other (Comment) (yellow MEWS)  Provider response See new orders  Date of Provider Response 12/01/21  Time of Provider Response 1828  Notify: Rapid Response  Name of Rapid Response RN Notified Not applicable  Document  Progress note created (see row info) Yes  Assess: SIRS CRITERIA  SIRS Temperature  0  SIRS Pulse 0  SIRS Respirations  0  SIRS WBC 0  SIRS  Score Sum  0

## 2021-12-01 NOTE — H&P (Signed)
History and Physical    Patient: Stacey Chambers YOV:[id number] DOB: [birth year] DOA: 12/01/2021 DOS: the patient was seen and examined on 12/01/2021 PCP: Trey Sailors, PA  Patient coming from: Home  Chief Complaint:  Chief Complaint  Patient presents with   Hypertension   Leg Swelling   HPI: Stacey Chambers is a 71 y.o. female with medical history significant of for but not limited to chronic iron deficiency anemia, diabetes mellitus type 2, hypertension, hyperlipidemia, hypothyroidism, history of CKD stage IIIb as well as other comorbidities who presents with worsening lower extremity swelling and elevated blood pressure.  Patient states that she started feeling fatigued and tired since Friday of last week is noticed that her legs started having worsening swelling.  She states that whenever she ambulates she gets very dyspneic on exertion and very fatigued.  States that she denies any abdominal pain and denies any cough, chest pain or lightheadedness or dizziness.  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  She had lower extremity swelling and given her dyspnea on exertion we will obtain an echocardiogram and evaluate for heart failure.  She is also noted to have AKI on CKD stage IIIb.  Incidentally she was found to be COVID-positive.  Gastroenterology was consulted and [facility] was asked to admit this patient for lower extremity swelling.  Interventional radiology was consulted for a paracentesis and this has been done and she underwent a diagnostic tap.  Of note she was seen with the assistance of the video translator Alejandra [phone].  Review of Systems: As mentioned in the history of present illness. All other systems reviewed and are negative.  Past Medical History:  Diagnosis Date   Anemia    Diabetes mellitus without  complication ([place])    Hypertension    Vision loss    Past Surgical History:  Procedure Laterality Date   BACK SURGERY     COLONOSCOPY     UPPER GASTROINTESTINAL ENDOSCOPY     Social History:  reports that she has never smoked. She has never used smokeless tobacco. She reports that she does not currently use drugs after having used the following drugs: Marijuana. She reports that she does not drink alcohol.  No Known Allergies  Family History  Problem Relation Age of Onset   Diabetes Brother    Colon cancer Neg Hx    Esophageal cancer Neg Hx    Stomach cancer Neg Hx    Pancreatic cancer Neg Hx    Inflammatory bowel disease Neg Hx    Liver disease Neg Hx    Rectal cancer Neg Hx    Colon polyps Neg Hx     Prior to Admission medications   Medication Sig Start Date End Date Taking? Authorizing Provider  amLODipine (NORVASC) 2.5 MG tablet Take 2.5 mg by mouth daily. 11/25/21  Yes [provider]  atorvastatin (LIPITOR) 20 MG tablet Take 20 mg by mouth daily. 11/25/21  Yes [provider]  esomeprazole (NEXIUM) 40 MG capsule Take 1 capsule (40 mg total) by mouth 2 (two) times daily. 09/13/21  Yes Mansouraty, Telford Nab., MD  ferrous gluconate (FERGON) 324 MG tablet Take 1 tablet (324 mg total) by mouth daily with breakfast. 09/13/21  Yes Mansouraty, Telford Nab., MD  fluticasone [facility]) 50 MCG/ACT nasal spray Place 1 spray into both nostrils daily as needed for allergies. 10/20/21  Yes  [provider]  glipiZIDE (GLUCOTROL) 5 MG tablet Take 2.5 mg by mouth daily. 03/10/21  Yes [provider]  levothyroxine (SYNTHROID) 25 MCG tablet Take 25 mcg by mouth daily. 11/17/21  Yes [provider]  metoprolol succinate (TOPROL-XL) 50 MG 24 hr tablet Take 50 mg by mouth daily. 10/20/21  Yes [provider]  Vitamin D, Ergocalciferol, (DRISDOL) 1.25 MG (50000 UNIT) CAPS capsule Take 50,000 Units by mouth once a week. Wednesday 10/25/21  Yes [provider]  ACCU-CHEK GUIDE test strip  02/01/21   [provider]  Accu-Chek Softclix Lancets lancets SMARTSIG:Topical 01/31/21   [provider]    Physical Exam: Vitals:   12/01/21 1608 12/01/21 1622 12/01/21 1700 12/01/21 1826  BP: (!) 191/93 (!) 193/98 (!) 196/83 (!) 212/91  Pulse:   75 88  Resp:    19  Temp:  98.4 F (36.9 C)  98.9 F (37.2 C)  TempSrc:  Oral  Oral  SpO2:   100% 100%  Weight:      Height:       Examination: Physical Exam:  Constitutional: WN/WD overweight Hispanic female currently in no acute distress appears calm Respiratory: Diminished to auscultation bilaterally, no wheezing, rales, rhonchi or crackles. Normal respiratory effort and patient is not tachypenic. No accessory muscle use.  Unlabored breathing Cardiovascular: RRR, no murmurs / rubs / gallops. S1 and S2 auscultated.  1+ lower extremity edema Abdomen: Soft, non-tender, distended secondary to habitus.  Bowel sounds positive.  GU: Deferred. Musculoskeletal: No clubbing / cyanosis of digits/nails. No joint deformity upper and lower extremities. Skin: No rashes, lesions, ulcers on his skin evaluation. No induration; Warm and dry.  Neurologic: CN 2-12 grossly intact with no focal deficits. Romberg sign cerebellar reflexes not assessed.  Psychiatric: Normal judgment and insight. Alert and oriented x 3. Normal mood and appropriate affect.   Data Reviewed:  Clinical laboratory data was reviewed and she is COVID-positive and she has an elevated BNP of 715.8, has a hemoglobin/hematocrit of 10.2/31.3, platelet count 539, metabolic acidosis with a CO2 of 18, anion gap of 9, chloride of 109, BUNs/creatinine of 46/2.93, albumin level 2.8, lipase level 94, AST of 43 and elevated D-dimer 5.88 with a urinalysis that was relatively unremarkable except showing moderate hemoglobin and dipstick with greater than 300 protein an greater than 50 RBCs per high-power field no bacteria seen  CT scan of  the abdomen pelvis: . Cirrhotic liver with evidence of portal hypertension including mild splenomegaly and moderate volume ascites. 2. Small layering bilateral pleural effusions with mild dependent bibasilar airspace opacities, which may represent atelectasis and/or pneumonia. 3. Probable small stone in the region of the gallbladder neck with associated gallbladder wall thickening. If there is clinical concern for acute cholecystitis, further evaluation with right upper quadrant ultrasound is recommended. 4. Colonic diverticulosis without evidence of acute diverticulitis. 5. Chronic appearing severe compression fracture of the L3 vertebral body.  EKG showed sinus rhythm with right atrial enlargement and right bundle blanch block on personal review  Assessment and Plan: No notes have been filed under this hospital service. Service: Hospitalist  New onset liver cirrhosis with portal hypertension and associated mild splenomegaly and moderate ascites Mildly elevated AST -Unclear etiology of her liver cirrhosis so we have consulted GI for further evaluation recommendations -Check acute hepatitis panel -Obtaining diagnostic paracentesis for moderate volume ascites and may need a therapeutic thoracentesis; diagnostic paracentesis yielded 180 mils of hazy fluid and this was sent off for fluid analysis -  Given IV Lasix 20 mg x 1 in the ED and may need another dose of repeat -AST was 43 -Ammonia Level was 20 and PT-INR was 17.4/1.4 respectively  -We will defer diuretics to further GI evaluation recommendations -She has had alcohol in the past but denies any currently; has been placed on Solu-Medrol half a milligram per kilogram every 12 for 3 days given her COVID-positive -Check echocardiogram   Dyspnea on Exertion r/o CHF (Unclear type) -Noted to have an elevated BNP on admission 715 -Placed on Combivent 1 puff IH every 6 scheduled -Check ECHOCardiogram -Getting IV Lasix 20 mg x2 -D-Dimer  Elevated but likely in the setting of COVID -D/G Chest X-ray showed "Central pulmonary vessels are prominent, possibly suggesting mild CHF. There is no focal pulmonary  consolidation. Small transverse linear density in the left lower lung fields may suggest scarring or subsegmental atelectasis."  COVID-19 Disease -Currently asymptomatic and chest x-ray and chest CT scan done -Check inflammatory markers and D-dimer was elevated at 5.88 and fibrinogen was 371 -CXR as Above -Check Blood Cx x2 and Procalcitonin -Inflammatory Marker Trend:  Recent Labs    12/01/21 1704  DDIMER 5.88*  FERRITIN 27    Lab Results  Component Value Date   SARSCOV2NAA POSITIVE (A) 12/01/2021   -Airborne and Contract Precautions -Will place on Combivent 1 puff IH q6h -Started Molnupiravir 800 mg po BID x5 Days -Antitussives with Robitussin DM and Tussionex  -Started Steroids Solumedrol 0.5 mg/kg x12 hours for 3 days and then 50 mg po daily  -Start Zinc and Vitamin C  Hypertensive Urgency -BP was elevated on Admission -C/w Amlodipine 2.5 mg pi Daily and Metoprolol Succinate 50 mg po daily  -Added Hydralazine 10 mg q6hprn for SBP>180 or DBP>100 -Continue to Monitor BP per Protocol -Last BP reading was elevated at 196/83  Hx of Gastric Ulcer/GERD/GI Prophylaxis -C/w PPI BID  Diabetes Mellitus Type 2 -Hold Home Glipizide -Check HbA1c -Place on Moderate Novolog SSI AC/HS -Continue to Monitor and Trend Blood Sugars carefully   Hypothyroidism -Check TSH -C/w Levothyroxine 25 mcg po Daily  AKI on likely CKD stage IIIb Metabolic Acidosis -His baseline creatinine has been around 1.4-2.1 but has basically been CKD stage IIIb -Patient's BUNs/creatinine now is 46/2.93 and she has a slight metabolic acidosis with a CO2 of 18, anion gap of 9, chloride level of 109 -Given 2 doses of IV Lasix 20 mg given her volume overload -Avoid further nephrotoxic medications, contrast dyes, hypotension and dehydration to  ensure adequate renal perfusion and repeat CMP in a.m.  Normocytic anemia -Patient has a history of iron deficiency and take iron supplementation but will hold this for now pending Anemia Panel -Patient's hemoglobin/hematocrit now 10.2/31.3 -Continue to monitor for signs and symptoms of bleeding; no overt bleeding noted -Repeat CBC in a.m.  Thrombocytopenia -Patient's Platelet Count is now 109 and possibly in the setting of splenomegaly and new onset liver cirrhosis -Overt signs or symptoms of bleeding; repeat CBC in a.m.   Advance Care Planning:   Code Status: Full Code FULL CODE   Consults: Gastroenterology  Family Communication: No family present at bedside   Severity of Illness: The appropriate patient status for this patient is INPATIENT. Inpatient status is judged to be reasonable and necessary in order to provide the required intensity of service to ensure the patient's safety. The patient's presenting symptoms, physical exam findings, and initial radiographic and laboratory data in the context of their chronic comorbidities is felt to place them at high  risk for further clinical deterioration. Furthermore, it is not anticipated that the patient will be medically stable for discharge from the hospital within 2 midnights of admission.   * I certify that at the point of admission it is my clinical judgment that the patient will require inpatient hospital care spanning beyond 2 midnights from the point of admission due to high intensity of service, high risk for further deterioration and high frequency of surveillance required.*  Author: Raiford Noble, DO 12/01/2021 6:32 PM  For on call review [email or web address].

## 2021-12-01 NOTE — Progress Notes (Signed)
Received patient from ED via wheelchair.  Patient is alert and oriented x 4.  Assisted in bed in position of comfort.  Oriented to room and unit routine.  No complaints at this time.  Needs addressed.

## 2021-12-01 NOTE — ED Provider Notes (Signed)
[facility] DEPT Provider Note   CSN: [id number] Arrival date & time: 12/01/21  1122     History  Chief Complaint  Patient presents with   Hypertension   Leg Swelling    Stacey Chambers is a 71 y.o. female.  She has a history of hypertension.  She is here with complaint of 1 week of fatigue, dyspnea on exertion, swelling of her abdomen and feet.  Blood pressure elevated.  Saw PCP today for same and they told her not to take her blood pressure medicine and come here for evaluation.  She denies any headache blurry vision chest pain.  She does have some abdominal pain that keeps her up at night.  She said she has not had much of an appetite and is only drinking smoothies due to her poor dentures.  Denies any diarrhea constipation or urinary symptoms.  No fevers or chills.  No cough.  The history is provided by the patient. The history is limited by a language barrier. A language interpreter was used Engineer, structural [place]).  Hypertension This is a chronic problem. The problem occurs constantly. The problem has not changed since onset.Associated symptoms include abdominal pain and shortness of breath. Pertinent negatives include no chest pain and no headaches. Nothing aggravates the symptoms. Nothing relieves the symptoms. She has tried nothing for the symptoms. The treatment provided no relief.      Home Medications Prior to Admission medications   Medication Sig Start Date End Date Taking? Authorizing Provider  ACCU-CHEK GUIDE test strip  02/01/21   [provider]  Accu-Chek Softclix Lancets lancets SMARTSIG:Topical 01/31/21   [provider]  amLODipine (NORVASC) 10 MG tablet Take 10 mg by mouth daily. Patient not taking: Reported on 08/30/2021 01/31/21   [provider]  amoxicillin (AMOXIL) 500 MG tablet Take 500 mg by mouth every 8 (eight) hours. 08/03/21   [provider]  esomeprazole (NEXIUM) 40 MG capsule Take 1 capsule (40  mg total) by mouth 2 (two) times daily. 09/13/21   Mansouraty, Telford Nab., MD  ferrous gluconate (FERGON) 324 MG tablet Take 1 tablet (324 mg total) by mouth daily with breakfast. 09/13/21   Mansouraty, Telford Nab., MD  glipiZIDE (GLUCOTROL) 5 MG tablet Take 5 mg by mouth daily. Patient not taking: Reported on 08/30/2021 03/10/21   [provider]      Allergies    Patient has no known allergies.    Review of Systems   Review of Systems  Constitutional:  Positive for fatigue. Negative for fever.  HENT:  Negative for sore throat.   Eyes:  Negative for visual disturbance.  Respiratory:  Positive for shortness of breath.   Cardiovascular:  Positive for leg swelling. Negative for chest pain.  Gastrointestinal:  Positive for abdominal pain. Negative for diarrhea, nausea and vomiting.  Genitourinary:  Negative for dysuria.  Skin:  Negative for rash.  Neurological:  Negative for headaches.   Physical Exam Updated Vital Signs BP (!) 229/102 (BP Location: Left Arm)   Pulse 85   Temp 98.2 F (36.8 C) (Oral)   Resp 18   SpO2 98%  Physical Exam Vitals and nursing note reviewed.  Constitutional:      General: She is not in acute distress.    Appearance: Normal appearance. She is well-developed.  HENT:     Head: Normocephalic and atraumatic.  Eyes:     Conjunctiva/sclera: Conjunctivae normal.  Cardiovascular:     Rate and Rhythm: Normal rate and  regular rhythm.     Heart sounds: No murmur heard. Pulmonary:     Effort: Pulmonary effort is normal. No respiratory distress.     Breath sounds: Normal breath sounds.  Abdominal:     Palpations: Abdomen is soft.     Tenderness: There is no abdominal tenderness. There is no guarding or rebound.  Musculoskeletal:        General: Normal range of motion.     Cervical back: Neck supple.     Right lower leg: No edema.     Left lower leg: No edema.  Skin:    General: Skin is warm and dry.     Capillary Refill: Capillary refill takes  less than 2 seconds.  Neurological:     General: No focal deficit present.     Mental Status: She is alert.    ED Results / Procedures / Treatments   Labs (all labs ordered are listed, but only abnormal results are displayed) Labs Reviewed  SARS CORONAVIRUS 2 BY RT PCR - Abnormal; Notable for the following components:      Result Value   SARS Coronavirus 2 by RT PCR POSITIVE (*)    All other components within normal limits  COMPREHENSIVE METABOLIC PANEL - Abnormal; Notable for the following components:   CO2 18 (*)    Glucose, Bld 125 (*)    BUN 46 (*)    Creatinine, Ser 2.93 (*)    Calcium 8.5 (*)    Albumin 2.8 (*)    AST 43 (*)    Alkaline Phosphatase 142 (*)    GFR, Estimated 17 (*)    All other components within normal limits  LIPASE, BLOOD - Abnormal; Notable for the following components:   Lipase 94 (*)    All other components within normal limits  CBC WITH DIFFERENTIAL/PLATELET - Abnormal; Notable for the following components:   RBC 3.28 (*)    Hemoglobin 10.2 (*)    HCT 31.3 (*)    Platelets 109 (*)    Lymphs Abs 0.5 (*)    All other components within normal limits  BRAIN NATRIURETIC PEPTIDE - Abnormal; Notable for the following components:   B Natriuretic Peptide 715.8 (*)    All other components within normal limits  URINALYSIS, ROUTINE W REFLEX MICROSCOPIC - Abnormal; Notable for the following components:   Hgb urine dipstick MODERATE (*)    Protein, ur >=300 (*)    RBC / HPF >50 (*)    All other components within normal limits  PROTIME-INR - Abnormal; Notable for the following components:   Prothrombin Time 17.4 (*)    INR 1.4 (*)    All other components within normal limits  CULTURE, BLOOD (ROUTINE X 2)  CULTURE, BLOOD (ROUTINE X 2)  FUNGUS CULTURE WITH STAIN  AEROBIC CULTURE W GRAM STAIN (SUPERFICIAL SPECIMEN)  ACID FAST CULTURE WITH REFLEXED SENSITIVITIES (MYCOBACTERIA)  ACID FAST SMEAR (AFB, MYCOBACTERIA)  BODY FLUID CULTURE W GRAM STAIN  RAPID  URINE DRUG SCREEN, HOSP PERFORMED  AMMONIA  HIV ANTIBODY (ROUTINE TESTING W REFLEX)  D-DIMER, QUANTITATIVE  FERRITIN  FIBRINOGEN  PROCALCITONIN  C-REACTIVE PROTEIN  LACTATE DEHYDROGENASE, PLEURAL OR PERITONEAL FLUID  TRIGLYCERIDES, BODY FLUIDS  BODY FLUID CELL COUNT WITH DIFFERENTIAL  AMYLASE, PLEURAL OR PERITONEAL FLUID                ALBUMIN, PLEURAL OR PERITONEAL FLUID   PROTEIN, PLEURAL OR PERITONEAL FLUID  MISC [facility] TEST (SEND OUT)  LIPASE, FLUID  TOTAL BILIRUBIN, BODY  FLUID  CBC WITH DIFFERENTIAL/PLATELET  COMPREHENSIVE METABOLIC PANEL  C-REACTIVE PROTEIN  D-DIMER, QUANTITATIVE  FERRITIN  MAGNESIUM  PHOSPHORUS  TROPONIN I (HIGH SENSITIVITY)  TROPONIN I (HIGH SENSITIVITY)    EKG EKG Interpretation  Date/Time:  Thursday December 01 2021 11:40:18 EDT Ventricular Rate:  82 PR Interval:  144 QRS Duration: 131 QT Interval:  400 QTC Calculation: 468 R Axis:   78 Text Interpretation: Sinus rhythm Probable left atrial enlargement Right bundle branch block No old tracing to compare Confirmed by Aletta Edouard [phone]) on 12/01/2021 11:46:28 AM  Radiology CT Abdomen Pelvis Wo Contrast  Result Date: 12/01/2021 CLINICAL DATA:  Abdominal pain, acute, nonlocalized EXAM: CT ABDOMEN AND PELVIS WITHOUT CONTRAST TECHNIQUE: Multidetector CT imaging of the abdomen and pelvis was performed following the standard protocol without IV contrast. RADIATION DOSE REDUCTION: This exam was performed according to the departmental dose-optimization program which includes automated exposure control, adjustment of the mA and/or kV according to patient size and/or use of iterative reconstruction technique. COMPARISON:  MRI 06/29/2021 FINDINGS: Lower chest: Small layering bilateral pleural effusions with mild dependent bibasilar airspace opacities. Heart size within normal limits. Hepatobiliary: Nodular hepatic surface contour. No focal liver lesion identified on unenhanced imaging. Probable small stone in  the region of the gallbladder neck. Gallbladder wall appears thickened. Pancreas: Unremarkable. No pancreatic ductal dilatation or surrounding inflammatory changes. Spleen: Mild splenomegaly.  No focal splenic lesion identified. Adrenals/Urinary Tract: Unremarkable adrenal glands. 2.9 cm slightly hyperdense lesion arising anteriorly from the lower pole of the left kidney previously seen to be a benign cyst on prior MRI. Kidneys otherwise within normal limits. No renal lesion or hydronephrosis. Urinary bladder is unremarkable. Stomach/Bowel: Stomach is within normal limits. Appendix appears normal. Left-sided colonic diverticulosis. No evidence of bowel wall thickening, distention, or inflammatory changes. Vascular/Lymphatic: Scattered aortoiliac atherosclerotic calcifications without aneurysm. No abdominopelvic lymphadenopathy. Reproductive: Uterus and bilateral adnexa are unremarkable. Other: Moderate volume ascites.  No pneumoperitoneum. Musculoskeletal: Anasarca. Prior T12-L5 posterior spinal fusion. Chronic appearing severe compression fracture of the L3 vertebral body. IMPRESSION: 1. Cirrhotic liver with evidence of portal hypertension including mild splenomegaly and moderate volume ascites. 2. Small layering bilateral pleural effusions with mild dependent bibasilar airspace opacities, which may represent atelectasis and/or pneumonia. 3. Probable small stone in the region of the gallbladder neck with associated gallbladder wall thickening. If there is clinical concern for acute cholecystitis, further evaluation with right upper quadrant ultrasound is recommended. 4. Colonic diverticulosis without evidence of acute diverticulitis. 5. Chronic appearing severe compression fracture of the L3 vertebral body. 6. Aortic Atherosclerosis (ICD10-I70.0). Electronically Signed   By: Davina Poke D.O.   On: 12/01/2021 13:40   US Paracentesis  Result Date: 12/01/2021 INDICATION: Patient with history of cirrhosis by  imaging, portal hypertension, mild splenomegaly, ascites, pleural effusions, COVID-19 positive, worsening renal function; request received for diagnostic paracentesis. EXAM: ULTRASOUND GUIDED DIAGNOSTIC PARACENTESIS MEDICATIONS: 8 mL 1% lidocaine COMPLICATIONS: None immediate. PROCEDURE: Informed written consent was obtained from the patient via interpreter after a discussion of the risks, benefits and alternatives to treatment. A timeout was performed prior to the initiation of the procedure. Initial ultrasound scanning demonstrates a moderate amount of ascites within the right lower abdominal quadrant. The right lower abdomen was prepped and draped in the usual sterile fashion. 1% lidocaine was used for local anesthesia. Following this, a 19 gauge, 10-cm, Yueh catheter was introduced. An ultrasound image was saved for documentation purposes. The paracentesis was performed. The catheter was removed and a dressing was applied. The  patient tolerated the procedure well without immediate post procedural complication. FINDINGS: A total of approximately 180 cc of hazy, yellow fluid was removed. Samples were sent to the laboratory as requested by the clinical team. IMPRESSION: Successful ultrasound-guided diagnostic paracentesis yielding 180 cc of peritoneal fluid. Read by: Rowe Robert, PA-C Electronically Signed   By: Sandi Mariscal M.D.   On: 12/01/2021 16:41   DG Chest Port 1 View  Result Date: 12/01/2021 CLINICAL DATA:  Shortness of breath, edema both lower extremities EXAM: PORTABLE CHEST 1 VIEW COMPARISON:  None Available. FINDINGS: Transverse diameter of heart is increased. Central pulmonary vessels are prominent. There are no signs of alveolar pulmonary edema or focal pulmonary consolidation. Small linear density in the left lower lung fields may suggest scarring or subsegmental atelectasis. There is no pleural effusion or pneumothorax. IMPRESSION: Central pulmonary vessels are prominent, possibly suggesting mild  CHF. There is no focal pulmonary consolidation. Small transverse linear density in the left lower lung fields may suggest scarring or subsegmental atelectasis. Electronically Signed   By: Elmer Picker M.D.   On: 12/01/2021 12:33   US Abdomen Limited RUQ (LIVER/GB)  Result Date: 12/01/2021 CLINICAL DATA:  Elevated LFTs. EXAM: ULTRASOUND ABDOMEN LIMITED RIGHT UPPER QUADRANT COMPARISON:  MRI of the abdomen without with contrast 06/29/2021. CT abdomen pelvis without contrast 12/01/2021 FINDINGS: Gallbladder: Gallbladder is contracted. Wall measures 5.3 mm. Pericholecystic fluid is present. Sludge is noted in the gallbladder. Common bile duct: Diameter: 2.2 mm, within normal limits. Liver: The liver is somewhat hyperechoic and heterogeneous. Nodular edge noted. Portal vein is patent on color Doppler imaging with normal direction of blood flow towards the liver. Other: Moderate abdominal ascites is present. Moderate right-sided pleural effusion is also noted. IMPRESSION: 1. Cirrhotic appearance of the liver. 2. Dominant ascites. 3. Gallbladder wall thickening may be artifactual given the gallbladder is shrunken and abdominal ascites are present. Electronically Signed   By: San Morelle M.D.   On: 12/01/2021 14:42    Procedures Procedures    Medications Ordered in ED Medications  Ipratropium-Albuterol (COMBIVENT) respimat 1 puff (has no administration in time range)  methylPREDNISolone sodium succinate (SOLU-MEDROL) 40 mg/mL injection 32.4 mg (32.4 mg Intravenous Given 12/01/21 1705)    Followed by  predniSONE (DELTASONE) tablet 50 mg (has no administration in time range)  guaiFENesin-dextromethorphan (ROBITUSSIN DM) 100-10 MG/5ML syrup 10 mL (has no administration in time range)  chlorpheniramine-HYDROcodone 10-8 MG/5ML suspension 5 mL (has no administration in time range)  ascorbic acid (VITAMIN C) tablet 500 mg (500 mg Oral Given 12/01/21 1707)  zinc sulfate capsule 220 mg (220 mg Oral Given  12/01/21 1707)  molnupiravir EUA (LAGEVRIO) capsule 800 mg (has no administration in time range)  folic acid (FOLVITE) tablet 1 mg (1 mg Oral Given 12/01/21 1707)  multivitamin with minerals tablet 1 tablet (1 tablet Oral Given 12/01/21 1707)  thiamine tablet 100 mg (100 mg Oral Given 12/01/21 1707)  furosemide (LASIX) injection 20 mg (20 mg Intravenous Given 12/01/21 1440)  lidocaine (XYLOCAINE) 1 % (with pres) injection (15 mLs  Given 12/01/21 1620)    ED Course/ Medical Decision Making/ A&P Clinical Course as of 12/01/21 1734  Thu Dec 01, 2021  1231 Chest x-ray does not show any acute infiltrates or effusion.  Awaiting radiology reading. [MB]  1401 Discussed with [facility] hospitalist who will evaluate patient for admission.  He asked if I could get GI to see the patient also. [MB]  3532 Patient she drinks 2 or 3 beers every other  day.  No prior history of cirrhosis.  LFTs have been abnormal in the past.  Has new AKI on top of her chronic CKD.  Platelets low similar to prior.  Reported INR.  Reviewed results with her via [place] interpreter. [MB]    Clinical Course User Index [MB] Hayden Rasmussen, MD                           Medical Decision Making Amount and/or Complexity of Data Reviewed Labs: ordered. Radiology: ordered.  Risk Prescription drug management. Decision regarding hospitalization.  This patient complains of elevated blood pressure shortness of breath abdominal distention; this involves an extensive number of treatment Options and is a complaint that carries with it a high risk of complications and morbidity. The differential includes CHF, COPD, pneumonia, COVID, flu, liver failure, dehydration  I ordered, reviewed and interpreted labs, which included CBC with normal white count, hemoglobin low and platelets low stable from priors, chemistries with low bicarb and worsened BUN and creatinine, LFTs chronically mildly elevated, BNP elevated, COVID positive, urinalysis with hematuria,  tox screen negative I ordered medication IV Lasix for diuresis and reviewed PMP when indicated. I ordered imaging studies which included chest x-ray and CT abdomen and pelvis, right upper quadrant ultrasound and I independently    visualized and interpreted imaging which showed cirrhosis changes of liver and moderate ascites Previous records obtained and reviewed in epic, no prior history of cirrhosis that I can identify I consulted Dr. Alfredia Ferguson [facility] hospitalist and  GI and discussed lab and imaging findings and discussed disposition.  Cardiac monitoring reviewed, normal sinus rhythm Social determinants considered, no significant barriers Critical Interventions: None  After the interventions stated above, I reevaluated the patient and found patient to be hemodynamically stable and well-appearing Admission and further testing considered, she would benefit from mission to the hospital for paracentesis and further work-up of her complaints.  Patient in agreement with plan.          Final Clinical Impression(s) / ED Diagnoses Final diagnoses:  Primary hypertension  Hypervolemia, unspecified hypervolemia type  Liver disease  Thrombocytopenia ([place])    Rx / DC Orders ED Discharge Orders     None         Hayden Rasmussen, MD 12/01/21 1738

## 2021-12-02 ENCOUNTER — Inpatient Hospital Stay (HOSPITAL_COMMUNITY): Payer: Medicare Other

## 2021-12-02 DIAGNOSIS — K257 Chronic gastric ulcer without hemorrhage or perforation: Secondary | ICD-10-CM

## 2021-12-02 DIAGNOSIS — R0609 Other forms of dyspnea: Secondary | ICD-10-CM

## 2021-12-02 DIAGNOSIS — K7460 Unspecified cirrhosis of liver: Secondary | ICD-10-CM | POA: Diagnosis not present

## 2021-12-02 DIAGNOSIS — E118 Type 2 diabetes mellitus with unspecified complications: Secondary | ICD-10-CM | POA: Diagnosis not present

## 2021-12-02 DIAGNOSIS — R188 Other ascites: Secondary | ICD-10-CM | POA: Diagnosis not present

## 2021-12-02 DIAGNOSIS — N179 Acute kidney failure, unspecified: Secondary | ICD-10-CM | POA: Diagnosis not present

## 2021-12-02 LAB — ECHOCARDIOGRAM COMPLETE
AR max vel: 2.08 cm2
AV Area VTI: 2.52 cm2
AV Area mean vel: 2.09 cm2
AV Mean grad: 9 mmHg
AV Peak grad: 17.8 mmHg
Ao pk vel: 2.11 m/s
Area-P 1/2: 4.49 cm2
Height: 58 in
MV M vel: 4.5 m/s
MV Peak grad: 81 mmHg
S' Lateral: 3.1 cm
Weight: 2292.78 oz

## 2021-12-02 LAB — COMPREHENSIVE METABOLIC PANEL
ALT: 21 U/L (ref 0–44)
AST: 40 U/L (ref 15–41)
Albumin: 2.3 g/dL — ABNORMAL LOW (ref 3.5–5.0)
Alkaline Phosphatase: 111 U/L (ref 38–126)
Anion gap: 6 (ref 5–15)
BUN: 55 mg/dL — ABNORMAL HIGH (ref 8–23)
CO2: 18 mmol/L — ABNORMAL LOW (ref 22–32)
Calcium: 8.2 mg/dL — ABNORMAL LOW (ref 8.9–10.3)
Chloride: 111 mmol/L (ref 98–111)
Creatinine, Ser: 3.21 mg/dL — ABNORMAL HIGH (ref 0.44–1.00)
GFR, Estimated: 15 mL/min — ABNORMAL LOW (ref >60)
Glucose, Bld: 140 mg/dL — ABNORMAL HIGH (ref 70–99)
Potassium: 4 mmol/L (ref 3.5–5.1)
Sodium: 135 mmol/L (ref 135–145)
Total Bilirubin: 0.7 mg/dL (ref 0.3–1.2)
Total Protein: 6.3 g/dL — ABNORMAL LOW (ref 6.5–8.1)

## 2021-12-02 LAB — CBC WITH DIFFERENTIAL/PLATELET
Abs Immature Granulocytes: 0.02 10*3/uL (ref 0.00–0.07)
Basophils Absolute: 0 10*3/uL (ref 0.0–0.1)
Basophils Relative: 0 %
Eosinophils Absolute: 0 10*3/uL (ref 0.0–0.5)
Eosinophils Relative: 0 %
HCT: 25.8 % — ABNORMAL LOW (ref 36.0–46.0)
Hemoglobin: 8.5 g/dL — ABNORMAL LOW (ref 12.0–15.0)
Immature Granulocytes: 1 %
Lymphocytes Relative: 11 %
Lymphs Abs: 0.4 10*3/uL — ABNORMAL LOW (ref 0.7–4.0)
MCH: 31.3 pg (ref 26.0–34.0)
MCHC: 32.9 g/dL (ref 30.0–36.0)
MCV: 94.9 fL (ref 80.0–100.0)
Monocytes Absolute: 0.1 10*3/uL (ref 0.1–1.0)
Monocytes Relative: 4 %
Neutro Abs: 2.9 10*3/uL (ref 1.7–7.7)
Neutrophils Relative %: 84 %
Platelets: 96 10*3/uL — ABNORMAL LOW (ref 150–400)
RBC: 2.72 MIL/uL — ABNORMAL LOW (ref 3.87–5.11)
RDW: 13.6 % (ref 11.5–15.5)
WBC: 3.5 10*3/uL — ABNORMAL LOW (ref 4.0–10.5)
nRBC: 0 % (ref 0.0–0.2)

## 2021-12-02 LAB — OSMOLALITY: Osmolality: 301 mOsm/kg — ABNORMAL HIGH (ref 275–295)

## 2021-12-02 LAB — SODIUM, URINE, RANDOM: Sodium, Ur: 48 mmol/L

## 2021-12-02 LAB — HEPATITIS PANEL, ACUTE
HCV Ab: NONREACTIVE
Hep A IgM: NONREACTIVE
Hep B C IgM: NONREACTIVE
Hepatitis B Surface Ag: NONREACTIVE

## 2021-12-02 LAB — GLUCOSE, CAPILLARY
Glucose-Capillary: 200 mg/dL — ABNORMAL HIGH (ref 70–99)
Glucose-Capillary: 196 mg/dL — ABNORMAL HIGH (ref 70–99)
Glucose-Capillary: 171 mg/dL — ABNORMAL HIGH (ref 70–99)
Glucose-Capillary: 127 mg/dL — ABNORMAL HIGH (ref 70–99)

## 2021-12-02 LAB — FERRITIN: Ferritin: 25 ng/mL (ref 11–307)

## 2021-12-02 LAB — HEMOGLOBIN A1C
Hgb A1c MFr Bld: 4.9 % (ref 4.8–5.6)
Mean Plasma Glucose: 93.93 mg/dL

## 2021-12-02 LAB — D-DIMER, QUANTITATIVE: D-Dimer, Quant: 5.66 ug/mL-FEU — ABNORMAL HIGH (ref 0.00–0.50)

## 2021-12-02 LAB — PATHOLOGIST SMEAR REVIEW

## 2021-12-02 LAB — TRIGLYCERIDES, BODY FLUIDS: Triglycerides, Fluid: 44 mg/dL

## 2021-12-02 LAB — TSH: TSH: 2.793 u[IU]/mL (ref 0.350–4.500)

## 2021-12-02 LAB — MAGNESIUM: Magnesium: 2.1 mg/dL (ref 1.7–2.4)

## 2021-12-02 LAB — PHOSPHORUS: Phosphorus: 5.1 mg/dL — ABNORMAL HIGH (ref 2.5–4.6)

## 2021-12-02 LAB — CREATININE, URINE, RANDOM: Creatinine, Urine: 85.93 mg/dL

## 2021-12-02 LAB — OSMOLALITY, URINE: Osmolality, Ur: 380 mOsm/kg (ref 300–900)

## 2021-12-02 LAB — C-REACTIVE PROTEIN: CRP: 0.9 mg/dL (ref <1.0)

## 2021-12-02 MED ORDER — SODIUM CHLORIDE 0.9 % IV SOLN: INTRAVENOUS | Status: DC

## 2021-12-02 MED ORDER — IPRATROPIUM-ALBUTEROL 20-100 MCG/ACT IN AERS: 1.0000 | INHALATION_SPRAY | Freq: Four times a day (QID) | RESPIRATORY_TRACT | Status: DC | PRN

## 2021-12-02 MED ORDER — MELATONIN 5 MG PO TABS
5.0000 mg | ORAL_TABLET | Freq: Every evening | ORAL | Status: DC | PRN
  Administered 2021-12-02 – 2021-12-19 (×9): 5 mg via ORAL
  Filled 2021-12-02 (×9): qty 1

## 2021-12-02 NOTE — Progress Notes (Signed)
Tried to insert foley, there was a flash of urine, patient was yelling due to discomfort. I tried to inflate the balloon and the patient started yelling more stating it was very painful. Bladder scan showed 68 mL in bladder. Patient stated she will use bedside commode for measuring. She refused to foley. States she urinated before foley but she flushed the toilet. Charge nurse, Dr. Alfredia Ferguson and Dr. Jonnie Finner aware.

## 2021-12-02 NOTE — Plan of Care (Signed)

## 2021-12-02 NOTE — TOC Progression Note (Signed)
Transition of Care [facility]) - Progression Note    Patient Details  Name: Stacey Chambers MRN: [id number] Date of Birth: 06-23-1951  Transition of Care [facility]) CM/SW Contact  Purcell Mouton, RN Phone Number: 12/02/2021, 10:25 AM  Clinical Narrative:    At present time there are no HH needs.    Expected Discharge Plan: Home/Self Care Barriers to Discharge: No Barriers Identified  Expected Discharge Plan and Services Expected Discharge Plan: Home/Self Care   Discharge Planning Services: CM Consult   Living arrangements for the past 2 months: Single Family Home                                       Social Determinants of Health (SDOH) Interventions    Readmission Risk Interventions     View : No data to display.

## 2021-12-02 NOTE — Progress Notes (Signed)
  Echocardiogram 2D Echocardiogram has been performed.  Joette Catching 12/02/2021, 10:51 AM

## 2021-12-02 NOTE — Consult Note (Signed)
Renal Service Consult Note [facility]  Kerrigan Gombos 12/02/2021 Sol Blazing, MD Requesting Physician: Dr. Alfredia Ferguson  Reason for Consult: Renal failure HPI: The patient is a 71 y.o. year-old w/ hx of anemia, DM2, HTN, HL, hypothyroidism, CKD stage 3, cirrhosis who presented 6/01 for worsening LE swelling and elevated BP's. Also feeling tired and fatigued. No abd pain. In ED CT showed cirrhosis of the liver w/ signs of portal HTN w/ splenomegaly and ascites. Creat in ED was 2.9 (baseline 2.1 from Feb 2023). Pt was admitted for new onset cirrhosis w/ portal HTN. W/u in progress. Given IV lasix 14m. NH3 = 20, INR 1.4, AST 43.  Past drinker but denied any recently. Pt was covid + but asymptomatic. We are asked to see for renal failure.   Pt seen in room. Says she came to hospital for leg and belly swelling. Says the legs are better, belly still swollen. No SOB.   ROS - denies CP, no joint pain, no HA, no blurry vision, no rash, no diarrhea, no nausea/ vomiting, no dysuria, no difficulty voiding   Past Medical History  Past Medical History:  Diagnosis Date   Anemia    Diabetes mellitus without complication ([place]    Hypertension    Vision loss    Past Surgical History  Past Surgical History:  Procedure Laterality Date   BACK SURGERY     COLONOSCOPY     UPPER GASTROINTESTINAL ENDOSCOPY     Family History  Family History  Problem Relation Age of Onset   Diabetes Brother    Colon cancer Neg Hx    Esophageal cancer Neg Hx    Stomach cancer Neg Hx    Pancreatic cancer Neg Hx    Inflammatory bowel disease Neg Hx    Liver disease Neg Hx    Rectal cancer Neg Hx    Colon polyps Neg Hx    Social History  reports that she has never smoked. She has never used smokeless tobacco. She reports that she does not currently use drugs after having used the following drugs: Marijuana. She reports that she does not drink alcohol. Allergies No Known Allergies Home  medications Prior to Admission medications   Medication Sig Start Date End Date Taking? Authorizing Provider  amLODipine (NORVASC) 2.5 MG tablet Take 2.5 mg by mouth daily. 11/25/21  Yes [provider]  atorvastatin (LIPITOR) 20 MG tablet Take 20 mg by mouth daily. 11/25/21  Yes [provider]  esomeprazole (NEXIUM) 40 MG capsule Take 1 capsule (40 mg total) by mouth 2 (two) times daily. 09/13/21  Yes Mansouraty, GTelford Nab, MD  ferrous gluconate (FERGON) 324 MG tablet Take 1 tablet (324 mg total) by mouth daily with breakfast. 09/13/21  Yes Mansouraty, GTelford Nab, MD  fluticasone ([facility] 50 MCG/ACT nasal spray Place 1 spray into both nostrils daily as needed for allergies. 10/20/21  Yes [provider]  glipiZIDE (GLUCOTROL) 5 MG tablet Take 2.5 mg by mouth daily. 03/10/21  Yes [provider]  levothyroxine (SYNTHROID) 25 MCG tablet Take 25 mcg by mouth daily. 11/17/21  Yes [provider]  metoprolol succinate (TOPROL-XL) 50 MG 24 hr tablet Take 50 mg by mouth daily. 10/20/21  Yes [provider]  Vitamin D, Ergocalciferol, (DRISDOL) 1.25 MG (50000 UNIT) CAPS capsule Take 50,000 Units by mouth once a week. Wednesday 10/25/21  Yes [provider]  ACCU-CHEK GUIDE test strip  02/01/21   [provider]  Accu-Chek Softclix Lancets lancets  SMARTSIG:Topical 01/31/21   [provider]     Vitals:   12/02/21 0245 12/02/21 0457 12/02/21 0553 12/02/21 1320  BP:   (!) 148/79 (!) 135/99  Pulse:   78 68  Resp:   16   Temp:   98.5 F (36.9 C) 98 F (36.7 C)  TempSrc:   Oral Oral  SpO2: 99%  99% 100%  Weight:  65 kg    Height:       Exam Gen alert, no distress No rash, cyanosis or gangrene Sclera anicteric, throat clear  No jvd or bruits Chest clear bilat to bases, no rales/ wheezing RRR no MRG Abd soft, probable 2+ ascites, no mass or HSM,  +bs GU defer MS no joint effusions or deformity Ext no LE or UE edema, no  wounds or ulcers Neuro is alert, Ox 3 , nf       Home meds include - norvasc 2.5, esomeprazole, ferous gluconate, fluticasone, levothyroxine, meteprolol succinate xl 50 qd, ergocalciferol      Date   Creat  eGFR    July 2022  1.41  41 ml/min     Sept 2022  1.69  31 ml/min    Oct 2022  1.85      Feb 2023  2.17  23 ml/min    12/01/21  2.93    12/02/21  3.21  15      BP's here high on admit 200/95, down to 150/ 70s today     Rec'd IV lasix 20 mg x 2 on 6/01     BP's here not low, on norvasc 2.5 qd w/ BP's today 148/79 and 135/ 99     RR today 16- 20,  afebrile     UA - >50 rbcs, 0-5 wbcs, no bacteria, 0-5 epis, clear, yellow, mod Hb, > 300 prot     CXR - 6/01 > IMPRESSION: Central pulmonary vessels are prominent, possibly suggesting mild CHF. There is no focal pulmonary consolidation. Small transverse linear density in the left lower lung fields may suggest scarring or subsegmental atelectasis.     Assessment/ Plan: AKI on CKD IV - b/l creat 2.1 from Feb 2023, eGFR 23 ml/min.  Creat here 2.9 on admit 6/01 when she presented w/ LE edema, ^BP, fatigue. She was found to have cirrhosis here w/ portal HTN (large spleen, ascites). UA shows protein and rbc's. BP's wnl to high. Got IV lasix x2 yesterday. Creat up 3.2 today. She is not vol overloaded (except possible ascites) so would not give any more diuretics. UOP not being recorded and need urine studies, would place foley for next 48 hrs. Will start gentle IVF"s at 65 cc/hr. Get urine prot/ creat ratio. No uremic signs or symptoms. Strict I/O's, daily wts.BP's are not low, but could have HRS of the non-acute type, vs GN vs other. Will follow.  New onset cirrhosis/ portal HTN Resp - CXR does not look like edema. Lungs clear today, on RA, no SOB.  COVID -57 disease HTNsive urgency Hx GU/ GERD Hypothyroidism      [name]  MD 12/02/2021, 4:37 PM Recent Labs  Lab 12/01/21 1212 12/02/21 0458  HGB 10.2* 8.5*  ALBUMIN 2.8* 2.3*   CALCIUM 8.5* 8.2*  PHOS  --  5.1*  CREATININE 2.93* 3.21*  K 4.1 4.0

## 2021-12-02 NOTE — Progress Notes (Signed)
PROGRESS NOTE    Stacey Chambers  XTK:[id number] DOB: 1950-07-04 DOA: 12/01/2021 PCP: Trey Sailors, PA   Brief Narrative:  Stacey Chambers is a 71 y.o. female with medical history significant of for but not limited to chronic iron deficiency anemia, diabetes mellitus type 2, hypertension, hyperlipidemia, hypothyroidism, history of CKD stage IIIb as well as other comorbidities who presents with worsening lower extremity swelling and elevated blood pressure.  Patient states that she started feeling fatigued and tired since Friday of last week is noticed that her legs started having worsening swelling.  She states that whenever she ambulates she gets very dyspneic on exertion and very fatigued.  States that she denies any abdominal pain and denies any cough, chest pain or lightheadedness or dizziness.  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  She had lower extremity swelling and given her dyspnea on exertion we will obtain an echocardiogram and evaluate for heart failure.  She is also noted to have AKI on CKD stage IIIb.  Incidentally she was found to be COVID-positive.  Gastroenterology was consulted and [facility] was asked to admit this patient for lower extremity swelling.  Interventional radiology was consulted for a paracentesis and this has been done and she underwent a diagnostic tap.  **Interim History  Her edema has improved however renal function is worsened.  She is not complaining of any chest pain or shortness of breath.  Abdomen is doing okay today.  She has been consulted and doing further work-up and nephrology is also been consulted given her worsening renal function.  Assessment and Plan:  New onset liver cirrhosis with portal hypertension and associated mild splenomegaly and moderate ascites Mildly elevated  AST -Unclear etiology of her liver cirrhosis so we have consulted GI for further evaluation recommendations -Check acute hepatitis panel and Negative  -Obtaining diagnostic paracentesis for moderate volume ascites and may need a therapeutic thoracentesis; diagnostic paracentesis yielded 180 mils of hazy fluid and this was sent off for fluid analysis and not consistent with SBP -Given IV Lasix 20 mg x 2 -AST was 43 and repeat was 40 -Ammonia Level was 20 and PT-INR was 17.4/1.4 respectively  -We will defer diuretics to further GI evaluation recommendations -She has had alcohol in the past but denies any currently; has been placed on Solu-Medrol half a milligram per kilogram every 12 for 3 days given her COVID-positive and will stop -Check echocardiogram and showed EF of 60-65% and G2DD -GIs been consulted for further evaluation recommendations and recommending starting 2 g sodium diet and check an AFP as well as checking an ANA, mitochondrial antibody, smooth muscle antibody, ceruloplasmin and alpha-1 antitrypsin -Does not need an EGD as this was recently done as outlined but she had no varices -GI feels that she would benefit from low-dose diuretics however they are hesitant to initiate given her worsening renal status and significant CKD -She does have a history of alcoholism and use in the past years but GI feels that 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 There is no focal pulmonary   consolidation. Small transverse linear density in the left lower lung fields may suggest scarring or subsegmental atelectasis." -Repeat CXR in the AM    COVID-19 Disease -Currently asymptomatic and chest x-ray and chest CT scan done -Check inflammatory markers and D-dimer was elevated at 5.88 and fibrinogen was 371 -CXR as Above -Check Blood Cx x2 and Procalcitonin -Inflammatory Marker Trend:  Recent Labs    12/01/21 1704 12/02/21 0458  DDIMER 5.88* 5.66*  FERRITIN 27 25  CRP 0.9 0.9    Lab Results  Component Value Date   SARSCOV2NAA POSITIVE (A) 12/01/2021   -SpO2: 100 %; Not requiring any Supplemental O2 via   -Airborne and Contract Precautions -Will place on Combivent 1 puff IH q6h -Started Molnupiravir 800 mg po BID x5 Days -Antitussives with Robitussin DM and Tussionex  -Started Steroids Solumedrol 0.5 mg/kg x12 hours for 3 days and then 50 mg po daily but will stop given CRP not being elevated  -Start Zinc and Vitamin C   Hypertensive Urgency -BP was elevated on Admission -C/w Amlodipine 2.5 mg pi Daily and Metoprolol Succinate 50 mg po daily  -Added Hydralazine 10 mg q6hprn for SBP>180 or DBP>100 -Continue to Monitor BP per Protocol -Last BP reading was elevated but improved and 135/99   Hx of Gastric Ulcer/GERD/GI Prophylaxis -C/w PPI BID   Diabetes Mellitus Type 2 -Hold Home Glipizide -Check HbA1c -Place on Moderate Novolog SSI AC/HS -Continue to Monitor and Trend Blood Sugars carefully    Hypothyroidism -Check TSH was 2.793 -C/w Levothyroxine 25 mcg po Daily   AKI on likely CKD stage IIIb Metabolic Acidosis Hyperphosphatemia  -His baseline creatinine has been around 1.4-2.1 but has basically been CKD stage IIIb -Patient's BUNs/creatinine now is 46/2.93 and worsened to 55/3.2 And she has a slight metabolic acidosis with a CO2 of 18, anion gap of 6, chloride level of 111 -Given 2 doses of IV Lasix 20 mg given her volume overload and will hold off  further Diurectis given worsened Renal fxn -Check urine osmolality, urine sodium, urine creatinine, and renal ultrasound -Avoid further nephrotoxic medications, contrast dyes, hypotension and dehydration to ensure adequate renal perfusion and repeat CMP in a.m. -Patient's Phos level is now 5.1 -We will discuss with nephrology about further evaluation Dr. Jonnie Finner recommends obtaining a protein/creatinine urine ratio as well as place a Foley catheter for strict I's and O   Normocytic Anemia -Patient has a history of iron deficiency and take iron supplementation but will hold this for now pending Anemia Panel -Patient's hemoglobin/hematocrit now 10.2/31.3 -> 8.5/25.8 -Continue to monitor for signs and symptoms of bleeding; no overt bleeding noted -Repeat CBC in a.m.   Thrombocytopenia -Patient's Platelet Count is now 109 -> 96 and possibly in the setting of splenomegaly and new onset liver cirrhosis -Overt signs or symptoms of bleeding; repeat CBC in a.m.  DVT prophylaxis: heparin injection 5,000 Units Start: 12/01/21 2200 SCDs Start: 12/01/21 1826    Code Status: Full Code Family Communication: No family currently at bedside  Disposition Plan:  Level of care: Telemetry Status is: Inpatient Remains inpatient appropriate because: Pending further clinical work-up   Consultants:  Gastroenterology Nephrology  Procedures:  Diagnostic paracentesis Echocardiogram IMPRESSIONS     1. Left ventricular ejection fraction, by estimation, is 60 to 65%. The  left ventricle has  normal function. The left ventricle has no regional  wall motion abnormalities. Left ventricular diastolic parameters are  consistent with Grade II diastolic  dysfunction (pseudonormalization).   2. Right ventricular systolic function is normal. The right ventricular  size is normal. There is mildly elevated pulmonary artery systolic  pressure.   3. Left atrial size was mildly dilated.   4. The mitral valve is  normal in structure. No evidence of mitral valve  regurgitation.   5. There is mild calcification of the aortic valve. Aortic valve  regurgitation is trivial.   6. The inferior vena cava is normal in size with greater than 50%  respiratory variability, suggesting right atrial pressure of 3 mmHg.   Comparison(s): No prior Echocardiogram.   FINDINGS   Left Ventricle: Left ventricular ejection fraction, by estimation, is 60  to 65%. The left ventricle has normal function. The left ventricle has no  regional wall motion abnormalities. The left ventricular internal cavity  size was normal in size. There is   no left ventricular hypertrophy. Left ventricular diastolic parameters  are consistent with Grade II diastolic dysfunction (pseudonormalization).   Right Ventricle: The right ventricular size is normal. Right ventricular  systolic function is normal. There is mildly elevated pulmonary artery  systolic pressure. The tricuspid regurgitant velocity is 3.01 m/s, and  with an assumed right atrial pressure  of 3 mmHg, the estimated right ventricular systolic pressure is 62.2 mmHg.   Left Atrium: Left atrial size was mildly dilated.   Right Atrium: Right atrial size was normal in size.   Pericardium: There is no evidence of pericardial effusion.   Mitral Valve: The mitral valve is normal in structure. No evidence of  mitral valve regurgitation.   Tricuspid Valve: The tricuspid valve is not well visualized. Tricuspid  valve regurgitation is mild.   Aortic Valve: There is mild calcification of the aortic valve. Aortic  valve regurgitation is trivial. Aortic valve mean gradient measures 9.0  mmHg. Aortic valve peak gradient measures 17.8 mmHg. Aortic valve area, by  VTI measures 2.52 cm.   Pulmonic Valve: The pulmonic valve was not well visualized. Pulmonic valve  regurgitation is not visualized.   Aorta: The aortic root and ascending aorta are structurally normal, with  no evidence  of dilitation.   Venous: The inferior vena cava is normal in size with greater than 50%  respiratory variability, suggesting right atrial pressure of 3 mmHg.   IAS/Shunts: No atrial level shunt detected by color flow Doppler.      LEFT VENTRICLE  PLAX 2D  LVIDd:         4.70 cm   Diastology  LVIDs:         3.10 cm   LV e' medial:    8.38 cm/s  LV PW:         1.00 cm   LV E/e' medial:  13.6  LV IVS:        0.80 cm   LV e' lateral:   6.85 cm/s  LVOT diam:     1.90 cm   LV E/e' lateral: 16.6  LV SV:         105  LV SV Index:   67  LVOT Area:     2.84 cm      RIGHT VENTRICLE             IVC  RV Basal diam:  3.10 cm     IVC diam: 1.90 cm  RV Mid diam:  1.90 cm  RV S prime:     14.50 cm/s  TAPSE (M-mode): 2.5 cm   LEFT ATRIUM             Index        RIGHT ATRIUM           Index  LA diam:        3.50 cm 2.21 cm/m   RA Area:     11.10 cm  LA Vol (A2C):   66.0 ml 41.76 ml/m  RA Volume:   19.90 ml  12.59 ml/m  LA Vol (A4C):   53.2 ml 33.66 ml/m  LA Biplane Vol: 59.4 ml 37.58 ml/m   AORTIC VALVE                     PULMONIC VALVE  AV Area (Vmax):    2.08 cm      PV Vmax:       1.21 m/s  AV Area (Vmean):   2.09 cm      PV Peak grad:  5.9 mmHg  AV Area (VTI):     2.52 cm  AV Vmax:           211.00 cm/s  AV Vmean:          145.000 cm/s  AV VTI:            0.418 m  AV Peak Grad:      17.8 mmHg  AV Mean Grad:      9.0 mmHg  LVOT Vmax:         155.00 cm/s  LVOT Vmean:        107.000 cm/s  LVOT VTI:          0.371 m  LVOT/AV VTI ratio: 0.89     AORTA  Ao Root diam: 2.90 cm   MITRAL VALVE                TRICUSPID VALVE  MV Area (PHT): 4.49 cm     TR Peak grad:   36.2 mmHg  MV Decel Time: 169 msec     TR Vmax:        301.00 cm/s  MR Peak grad: 81.0 mmHg  MR Vmax:      450.00 cm/s   SHUNTS  MV E velocity: 114.00 cm/s  Systemic VTI:  0.37 m  MV A velocity: 94.30 cm/s   Systemic Diam: 1.90 cm  MV E/A ratio:  1.21   Antimicrobials:  Anti-infectives (From admission,  onward)    Start     Dose/Rate Route Frequency Ordered Stop   12/01/21 1900  molnupiravir EUA (LAGEVRIO) capsule 800 mg        4 capsule Oral 2 times daily 12/01/21 1546 12/06/21 2159       Subjective: Seen and examined at bedside and she appeared comfortable and she is a seen with assistance of the video translator Audelia Hives [phone].  She denies any chest pain or shortness of breath and thinks that the leg edema is improved.  She states that she is feeling overall okay.  No new lightheadedness or dizziness.  No other concerns or complaints at this time.  Objective: Vitals:   12/02/21 0245 12/02/21 0457 12/02/21 0553 12/02/21 1320  BP:   (!) 148/79 (!) 135/99  Pulse:   78 68  Resp:   16   Temp:   98.5 F (36.9 C) 98 F (36.7 C)  TempSrc:   Oral Oral  SpO2: 99%  99% 100%  Weight:  65 kg    Height:        Intake/Output Summary (Last 24 hours) at 12/02/2021 1615 Last data filed at 12/02/2021 1300 Gross per 24 hour  Intake 720 ml  Output --  Net 720 ml   Filed Weights   12/01/21 1500 12/01/21 1600 12/02/21 0457  Weight: 65 kg 65 kg 65 kg   Examination: Physical Exam:  Constitutional: WN/WD overweight Hispanic female currently no acute distress Respiratory: Diminished to auscultation bilaterally, no wheezing, rales, rhonchi or crackles. Normal respiratory effort and patient is not tachypenic. No accessory muscle use.  Unlabored breathing Cardiovascular: RRR, no murmurs / rubs / gallops. S1 and S2 auscultated.  Trace extremity edema.  Abdomen: Soft, non-tender, distended secondary body habitus. Bowel sounds positive.  GU: Deferred. Musculoskeletal: No clubbing / cyanosis of digits/nails. No joint deformity upper and lower extremities.  Neurologic: CN 2-12 grossly intact with no focal deficits. Romberg sign and cerebellar reflexes not assessed.  Psychiatric: Normal judgment and insight. Alert and oriented x 3. Normal mood and appropriate affect.   Data Reviewed: I have personally  reviewed following labs and imaging studies  CBC: Recent Labs  Lab 12/01/21 1212 12/02/21 0458  WBC 4.3 3.5*  NEUTROABS 3.3 2.9  HGB 10.2* 8.5*  HCT 31.3* 25.8*  MCV 95.4 94.9  PLT 109* 96*   Basic Metabolic Panel: Recent Labs  Lab 12/01/21 1212 12/02/21 0458  NA 136 135  K 4.1 4.0  CL 109 111  CO2 18* 18*  GLUCOSE 125* 140*  BUN 46* 55*  CREATININE 2.93* 3.21*  CALCIUM 8.5* 8.2*  MG  --  2.1  PHOS  --  5.1*   GFR: Estimated Creatinine Clearance: 13.4 mL/min (A) (by C-G formula based on SCr of 3.21 mg/dL (H)). Liver Function Tests: Recent Labs  Lab 12/01/21 1212 12/02/21 0458  AST 43* 40  ALT 21 21  ALKPHOS 142* 111  BILITOT 1.0 0.7  PROT 7.3 6.3*  ALBUMIN 2.8* 2.3*   Recent Labs  Lab 12/01/21 1212  LIPASE 94*   Recent Labs  Lab 12/01/21 1652  AMMONIA 20   Coagulation Profile: Recent Labs  Lab 12/01/21 1435  INR 1.4*   Cardiac Enzymes: No results for input(s): CKTOTAL, CKMB, CKMBINDEX, TROPONINI in the last 168 hours. BNP (last 3 results) No results for input(s): PROBNP in the last 8760 hours. HbA1C: Recent Labs    12/02/21 0458  HGBA1C 4.9   CBG: Recent Labs  Lab 12/01/21 2141 12/02/21 0726 12/02/21 1153  GLUCAP 242* 200* 196*   Lipid Profile: No results for input(s): CHOL, HDL, LDLCALC, TRIG, CHOLHDL, LDLDIRECT in the last 72 hours. Thyroid Function Tests: Recent Labs    12/02/21 0458  TSH 2.793   Anemia Panel: Recent Labs    12/01/21 1704 12/02/21 0458  FERRITIN 27 25   Sepsis Labs: Recent Labs  Lab 12/01/21 1704  PROCALCITON <0.10    Recent Results (from the past 240 hour(s))  SARS Coronavirus 2 by RT PCR (hospital order, performed in [facility] hospital lab) *cepheid single result test* Anterior Nasal Swab     Status: Abnormal   Collection Time: 12/01/21 12:12 PM   Specimen: Anterior Nasal Swab  Result Value Ref Range Status   SARS Coronavirus 2 by RT PCR POSITIVE (A) NEGATIVE Final    Comment:  (NOTE) SARS-CoV-2 target nucleic acids are DETECTED  SARS-CoV-2 RNA is generally detectable in upper respiratory specimens  during the acute phase of infection.  Positive results are indicative  of the presence of the identified virus, but do not rule out bacterial infection or co-infection with other pathogens not detected by the test.  Clinical correlation with patient history and  other diagnostic information is necessary to determine patient infection status.  The expected result is negative.  Fact Sheet for Patients:   https://www.patel.info/   Fact Sheet for Healthcare Providers:   https://hall.com/    This test is not yet approved or cleared by the [place] FDA and  has been authorized for detection and/or diagnosis of SARS-CoV-2 by FDA under an Emergency Use Authorization (EUA).  This EUA will remain in effect (meaning this test can be used) for the duration of  the COVID-19 declaration under Section 564(b)(1)  of the Act, 21 U.S.C. section 360-bbb-3(b)(1), unless the authorization is terminated or revoked sooner.   Performed at [facility], [place] [address] [place], [place] [address]   Culture, blood (Routine X 2) w Reflex to ID Panel     Status: None (Preliminary result)   Collection Time: 12/01/21  4:52 PM   Specimen: BLOOD  Result Value Ref Range Status   Specimen Description   Final    BLOOD SITE NOT SPECIFIED Performed at [facility] [address], [place], [place] [address]    Special Requests   Final    BOTTLES DRAWN AEROBIC AND ANAEROBIC Blood Culture results may not be optimal due to an inadequate volume of blood received in culture bottles Performed at [facility] [address], [place], [place] [address]    Culture   Final    NO GROWTH < 12 HOURS Performed at [place] [address] [place], [place] [address]    Report Status  PENDING  Incomplete  Culture, blood (Routine X 2) w Reflex to ID Panel     Status: None (Preliminary result)   Collection Time: 12/01/21  8:10 PM   Specimen: BLOOD  Result Value Ref Range Status   Specimen Description   Final    BLOOD BLOOD RIGHT HAND Performed at [facility] [address], [place], [place] [address]    Special Requests   Final    IN PEDIATRIC BOTTLE Blood Culture results may not be optimal due to an excessive volume of blood received in culture bottles Performed at [facility] [address], [place], [place] [address]    Culture   Final    NO GROWTH < 12 HOURS Performed at [facility] [address] [place], [place] [address]    Report Status PENDING  Incomplete  Culture, body fluid w Gram Stain-bottle     Status: None (Preliminary result)   Collection Time: 12/01/21  9:41 PM   Specimen: Peritoneal Washings  Result Value Ref Range Status   Specimen Description PERITONEAL  Final   Special Requests NONE  Final   Culture   Final    NO GROWTH < 12 HOURS Performed at [facility], [place] [address] [place],  [address]    Report Status PENDING  Incomplete  Gram stain     Status: None   Collection Time: 12/01/21  9:41 PM   Specimen: Peritoneal Washings  Result Value Ref Range Status   Specimen Description PERITONEAL  Final   Special Requests NONE  Final   Gram Stain   Final    NO WBC SEEN NO ORGANISMS SEEN Performed at [facility], [place] [address] [place], [place] [address]    Report Status 12/01/2021 FINAL  Final     Radiology Studies: CT Abdomen Pelvis Wo Contrast  Result Date: 12/01/2021 CLINICAL DATA:  Abdominal pain, acute, nonlocalized EXAM: CT ABDOMEN AND PELVIS WITHOUT CONTRAST TECHNIQUE: Multidetector CT imaging of the abdomen and pelvis was performed following the standard protocol without IV contrast. RADIATION DOSE REDUCTION: This exam was performed according to the departmental  dose-optimization program which includes automated exposure control, adjustment of the mA and/or kV according to patient size and/or use of iterative reconstruction technique. COMPARISON:  MRI 06/29/2021 FINDINGS: Lower chest: Small layering bilateral pleural effusions with mild dependent bibasilar airspace opacities. Heart size within normal limits. Hepatobiliary: Nodular hepatic surface contour. No focal liver lesion identified on unenhanced imaging. Probable small stone in the region of the gallbladder neck. Gallbladder wall appears thickened. Pancreas: Unremarkable. No pancreatic ductal dilatation or surrounding inflammatory changes. Spleen: Mild splenomegaly.  No focal splenic lesion identified. Adrenals/Urinary Tract: Unremarkable adrenal glands. 2.9 cm slightly hyperdense lesion arising anteriorly from the lower pole of the left kidney previously seen to be a benign cyst on prior MRI. Kidneys otherwise within normal limits. No renal lesion or hydronephrosis. Urinary bladder is unremarkable. Stomach/Bowel: Stomach is within normal limits. Appendix appears normal. Left-sided colonic diverticulosis. No evidence of bowel wall thickening, distention, or inflammatory changes. Vascular/Lymphatic: Scattered aortoiliac atherosclerotic calcifications without aneurysm. No abdominopelvic lymphadenopathy. Reproductive: Uterus and bilateral adnexa are unremarkable. Other: Moderate volume ascites.  No pneumoperitoneum. Musculoskeletal: Anasarca. Prior T12-L5 posterior spinal fusion. Chronic appearing severe compression fracture of the L3 vertebral body. IMPRESSION: 1. Cirrhotic liver with evidence of portal hypertension including mild splenomegaly and moderate volume ascites. 2. Small layering bilateral pleural effusions with mild dependent bibasilar airspace opacities, which may represent atelectasis and/or pneumonia. 3. Probable small stone in the region of the gallbladder neck with associated gallbladder wall thickening.  If there is clinical concern for acute cholecystitis, further evaluation with right upper quadrant ultrasound is recommended. 4. Colonic diverticulosis without evidence of acute diverticulitis. 5. Chronic appearing severe compression fracture of the L3 vertebral body. 6. Aortic Atherosclerosis (ICD10-I70.0). Electronically Signed   By: Davina Poke D.O.   On: 12/01/2021 13:40   US Paracentesis  Result Date: 12/01/2021 INDICATION: Patient with history of cirrhosis by imaging, portal hypertension, mild splenomegaly, ascites, pleural effusions, COVID-19 positive, worsening renal function; request received for diagnostic paracentesis. EXAM: ULTRASOUND GUIDED DIAGNOSTIC PARACENTESIS MEDICATIONS: 8 mL 1% lidocaine COMPLICATIONS: None immediate. PROCEDURE: Informed written consent was obtained from the patient via interpreter after a discussion of the risks, benefits and alternatives to treatment. A timeout was performed prior to the initiation of the procedure. Initial ultrasound scanning demonstrates a moderate amount of ascites within the right lower abdominal quadrant. The right lower abdomen was prepped and draped in the usual sterile fashion. 1% lidocaine was used for local anesthesia. Following this, a 19 gauge, 10-cm, Yueh catheter was introduced. An ultrasound image was saved for documentation purposes. The paracentesis was performed. The catheter was removed and a dressing was applied. The patient tolerated the procedure well without immediate post procedural complication. FINDINGS: A total of approximately 180 cc of hazy, yellow fluid was removed. Samples were sent to the laboratory as requested by the clinical team. IMPRESSION: Successful ultrasound-guided diagnostic paracentesis yielding 180 cc of peritoneal fluid. Read by: Rowe Robert, PA-C Electronically Signed   By: Sandi Mariscal M.D.   On: 12/01/2021 16:41   DG Chest [facility]  Result Date: 12/01/2021 CLINICAL DATA:  Shortness of breath, edema  both lower extremities EXAM: PORTABLE CHEST 1 VIEW COMPARISON:  None Available. FINDINGS: Transverse diameter of heart is increased. Central pulmonary vessels are prominent. There are no signs of alveolar pulmonary edema or focal pulmonary consolidation. Small linear density in the left lower lung fields may suggest scarring or subsegmental atelectasis. There is no pleural effusion or pneumothorax. IMPRESSION: Central pulmonary vessels are prominent, possibly suggesting mild CHF. There is no focal pulmonary consolidation. Small transverse linear density in the left lower lung fields may suggest scarring or subsegmental atelectasis. Electronically Signed   By: Elmer Picker M.D.   On: 12/01/2021 12:33   ECHOCARDIOGRAM COMPLETE  Result Date: 12/02/2021    ECHOCARDIOGRAM REPORT   Patient Name:   Stacey Chambers Date of Exam: 12/02/2021 Medical Rec #:  [id number]             Height:       58.0 in Accession #:    [id number]            Weight:       143.3 lb Date of Birth:  05/23/1951            BSA:          1.581 m Patient Age:    5 years              BP:           148/79 mmHg Patient Gender: F                     HR:           74 bpm. Exam Location:  Inpatient Procedure: 2D Echo, Cardiac Doppler and Color Doppler Indications:    Dyspnea  History:        Patient has no prior history of Echocardiogram examinations.                 Risk Factors:Diabetes and Hypertension.  Sonographer:    Joette Catching RCS Referring Phys: [id number] [facility] LATIF [facility]  Sonographer Comments: Technically challenging study due to limited acoustic windows. IMPRESSIONS  1. Left ventricular ejection fraction, by estimation, is 60 to 65%. The left ventricle has normal function. The left ventricle has no regional wall motion abnormalities. Left ventricular diastolic parameters are consistent with Grade II diastolic dysfunction (pseudonormalization).  2. Right ventricular systolic function is normal. The right ventricular size is  normal. There is mildly elevated pulmonary artery systolic pressure.  3. Left atrial size was mildly dilated.  4. The mitral valve is normal in structure. No evidence of mitral valve regurgitation.  5. There is mild calcification of the aortic valve. Aortic valve regurgitation is trivial.  6. The inferior vena cava is normal in size with greater than 50% respiratory variability, suggesting right atrial pressure of 3 mmHg. Comparison(s): No prior Echocardiogram. FINDINGS  Left Ventricle: Left ventricular ejection fraction, by estimation, is 60 to 65%. The left ventricle has normal function. The left ventricle has no regional wall motion abnormalities. The left ventricular internal cavity size was normal in size. There is  no left ventricular hypertrophy. Left ventricular diastolic parameters are consistent with Grade II diastolic dysfunction (pseudonormalization). Right Ventricle: The right ventricular size is normal. Right ventricular systolic function is normal. There is mildly elevated pulmonary artery systolic pressure. The tricuspid regurgitant velocity is 3.01 m/s, and with an assumed right atrial pressure of 3 mmHg, the estimated right ventricular systolic pressure  is 39.2 mmHg. Left Atrium: Left atrial size was mildly dilated. Right Atrium: Right atrial size was normal in size. Pericardium: There is no evidence of pericardial effusion. Mitral Valve: The mitral valve is normal in structure. No evidence of mitral valve regurgitation. Tricuspid Valve: The tricuspid valve is not well visualized. Tricuspid valve regurgitation is mild. Aortic Valve: There is mild calcification of the aortic valve. Aortic valve regurgitation is trivial. Aortic valve mean gradient measures 9.0 mmHg. Aortic valve peak gradient measures 17.8 mmHg. Aortic valve area, by VTI measures 2.52 cm. Pulmonic Valve: The pulmonic valve was not well visualized. Pulmonic valve regurgitation is not visualized. Aorta: The aortic root and ascending  aorta are structurally normal, with no evidence of dilitation. Venous: The inferior vena cava is normal in size with greater than 50% respiratory variability, suggesting right atrial pressure of 3 mmHg. IAS/Shunts: No atrial level shunt detected by color flow Doppler.  LEFT VENTRICLE PLAX 2D LVIDd:         4.70 cm   Diastology LVIDs:         3.10 cm   LV e' medial:    8.38 cm/s LV PW:         1.00 cm   LV E/e' medial:  13.6 LV IVS:        0.80 cm   LV e' lateral:   6.85 cm/s LVOT diam:     1.90 cm   LV E/e' lateral: 16.6 LV SV:         105 LV SV Index:   67 LVOT Area:     2.84 cm  RIGHT VENTRICLE             IVC RV Basal diam:  3.10 cm     IVC diam: 1.90 cm RV Mid diam:    1.90 cm RV S prime:     14.50 cm/s TAPSE (M-mode): 2.5 cm LEFT ATRIUM             Index        RIGHT ATRIUM           Index LA diam:        3.50 cm 2.21 cm/m   RA Area:     11.10 cm LA Vol (A2C):   66.0 ml 41.76 ml/m  RA Volume:   19.90 ml  12.59 ml/m LA Vol (A4C):   53.2 ml 33.66 ml/m LA Biplane Vol: 59.4 ml 37.58 ml/m  AORTIC VALVE                     PULMONIC VALVE AV Area (Vmax):    2.08 cm      PV Vmax:       1.21 m/s AV Area (Vmean):   2.09 cm      PV Peak grad:  5.9 mmHg AV Area (VTI):     2.52 cm AV Vmax:           211.00 cm/s AV Vmean:          145.000 cm/s AV VTI:            0.418 m AV Peak Grad:      17.8 mmHg AV Mean Grad:      9.0 mmHg LVOT Vmax:         155.00 cm/s LVOT Vmean:        107.000 cm/s LVOT VTI:          0.371 m LVOT/AV VTI ratio: 0.89  AORTA Ao Root  diam: 2.90 cm MITRAL VALVE                TRICUSPID VALVE MV Area (PHT): 4.49 cm     TR Peak grad:   36.2 mmHg MV Decel Time: 169 msec     TR Vmax:        301.00 cm/s MR Peak grad: 81.0 mmHg MR Vmax:      450.00 cm/s   SHUNTS MV E velocity: 114.00 cm/s  Systemic VTI:  0.37 m MV A velocity: 94.30 cm/s   Systemic Diam: 1.90 cm MV E/A ratio:  1.21 Phineas Inches Electronically signed by Phineas Inches Signature Date/Time: 12/02/2021/11:32:23 AM    Final    US Abdomen Limited  RUQ (LIVER/GB)  Result Date: 12/01/2021 CLINICAL DATA:  Elevated LFTs. EXAM: ULTRASOUND ABDOMEN LIMITED RIGHT UPPER QUADRANT COMPARISON:  MRI of the abdomen without with contrast 06/29/2021. CT abdomen pelvis without contrast 12/01/2021 FINDINGS: Gallbladder: Gallbladder is contracted. Wall measures 5.3 mm. Pericholecystic fluid is present. Sludge is noted in the gallbladder. Common bile duct: Diameter: 2.2 mm, within normal limits. Liver: The liver is somewhat hyperechoic and heterogeneous. Nodular edge noted. Portal vein is patent on color Doppler imaging with normal direction of blood flow towards the liver. Other: Moderate abdominal ascites is present. Moderate right-sided pleural effusion is also noted. IMPRESSION: 1. Cirrhotic appearance of the liver. 2. Dominant ascites. 3. Gallbladder wall thickening may be artifactual given the gallbladder is shrunken and abdominal ascites are present. Electronically Signed   By: San Morelle M.D.   On: 12/01/2021 14:42    Scheduled Meds:  amLODipine  2.5 mg Oral Daily   vitamin C  500 mg Oral Daily   docusate sodium  100 mg Oral BID   folic acid  1 mg Oral Daily   heparin  5,000 Units Subcutaneous Q8H   insulin aspart  0-15 Units Subcutaneous TID WC   insulin aspart  0-5 Units Subcutaneous QHS   levothyroxine  25 mcg Oral Daily   metoprolol succinate  50 mg Oral Daily   molnupiravir EUA  4 capsule Oral BID   multivitamin with minerals  1 tablet Oral Daily   pantoprazole  40 mg Oral BID   thiamine  100 mg Oral Daily   zinc sulfate  220 mg Oral Daily   Continuous Infusions:   LOS: 1 day   Raiford Noble, DO [facility] Available via Epic secure chat 7am-7pm After these hours, please refer to coverage provider listed on [email or web address] 12/02/2021, 4:15 PM

## 2021-12-02 NOTE — Consult Note (Addendum)
Consultation  Referring Provider:  [facility] Primary Care Physician:  Trey Sailors, PA Primary Gastroenterologist:  Dr.Mansouraty  Reason for Consultation: New diagnosis of cirrhosis with ascites  HPI: Stacey Chambers is a 71 y.o. female, [name]-speaking, known to Dr. Rush Landmark, who was admitted through the emergency room yesterday after she presented with complaints of fatigue and exertional dyspnea over the previous week and has also developed swelling in her legs and abdomen over the past couple of weeks. COVID-19 positive Work-up in the ER with CT of the abdomen and pelvis showed small bilateral effusions, a nodular appearing liver concerning for cirrhosis, probable small stone in the gallbladder neck, mild splenomegaly, moderate ascites, chronic compression fracture L3 and diverticulosis. Abdominal ultrasound also showed ascites and a cirrhotic appearing liver She had diagnostic paracentesis done yesterday with removal of 180 cc.  Fluid counts reviewed, not consistent with SBP, cytology is pending. Labs on admit WBC 4.3/hemoglobin 10.2/hematocrit 31/platelets 109 BNP 715, D-dimer 5.98, ferritin 27 INR 1.4/pro time 17.4 Sodium 136/BUN 46/creatinine 2.93 Albumin 2.3 T. bili 0.7 LFTs otherwise within normal limits  Hepatitis serologies pending Creatinine up to 3.2 today  Patient was tearful during our interview regarding new diagnosis of cirrhosis.  She says that she had been drinking heavily over the past couple of years after she had surgery on her back She expresses being alone here and being lonely, husband is back in [place] and no family locally.  She says she does not communicate with her daughters who live in the [place].  Currently drinking about 3 years daily She is unaware of any prior diagnosis of liver issues, no history of hepatitis that she is aware of. No family history of liver disease that she is aware of.  She had been seen in our office  last March 2023 at which time she had EGD and colonoscopy.  Colonoscopy revealed multiple diverticuli, poor prep of the right colon left colon okay and recommended to have follow-up colonoscopy in 6 to 12 months. EGD was done for surveillance of gastric metaplasia and for follow-up of gastric ulcer.  She was found to have a 14 mm gastric ulcer with no evidence of esophageal varices or Gastric mapping was done. Biopsies showed intestinal metaplasia, no H. pylori.  Medical issues include diabetes mellitus, hypertension, hyperlipidemia, chronic kidney disease stage IIIb, hypothyroidism. Had not been on any diuretics at home.     Past Medical History:  Diagnosis Date   Anemia    Diabetes mellitus without complication ([place])    Hypertension    Vision loss     Past Surgical History:  Procedure Laterality Date   BACK SURGERY     COLONOSCOPY     UPPER GASTROINTESTINAL ENDOSCOPY      Prior to Admission medications   Medication Sig Start Date End Date Taking? Authorizing Provider  amLODipine (NORVASC) 2.5 MG tablet Take 2.5 mg by mouth daily. 11/25/21  Yes [provider]  atorvastatin (LIPITOR) 20 MG tablet Take 20 mg by mouth daily. 11/25/21  Yes [provider]  esomeprazole (NEXIUM) 40 MG capsule Take 1 capsule (40 mg total) by mouth 2 (two) times daily. 09/13/21  Yes Mansouraty, Telford Nab., MD  ferrous gluconate (FERGON) 324 MG tablet Take 1 tablet (324 mg total) by mouth daily with breakfast. 09/13/21  Yes Mansouraty, Telford Nab., MD  fluticasone [facility]) 50 MCG/ACT nasal spray Place 1 spray into both nostrils daily as needed for allergies. 10/20/21  Yes [provider]  glipiZIDE (GLUCOTROL) 5 MG tablet Take 2.5 mg by mouth daily. 03/10/21  Yes [provider]  levothyroxine (SYNTHROID) 25 MCG tablet Take 25 mcg by mouth daily. 11/17/21  Yes [provider]  metoprolol succinate (TOPROL-XL) 50 MG 24 hr tablet Take 50 mg by mouth daily. 10/20/21  Yes  [provider]  Vitamin D, Ergocalciferol, (DRISDOL) 1.25 MG (50000 UNIT) CAPS capsule Take 50,000 Units by mouth once a week. Wednesday 10/25/21  Yes [provider]  ACCU-CHEK GUIDE test strip  02/01/21   [provider]  Accu-Chek Softclix Lancets lancets SMARTSIG:Topical 01/31/21   [provider]    Current Facility-Administered Medications  Medication Dose Route Frequency Provider Last Rate Last Admin   acetaminophen (TYLENOL) tablet 650 mg  650 mg Oral Q6H PRN Raiford Noble Latif, DO       Or   acetaminophen (TYLENOL) suppository 650 mg  650 mg Rectal Q6H PRN Alfredia Ferguson, Georgina Quint Latif, DO       amLODipine (NORVASC) tablet 2.5 mg  2.5 mg Oral Daily Raiford Noble [place], DO   2.5 mg at 12/01/21 2694   ascorbic acid (VITAMIN C) tablet 500 mg  500 mg Oral Daily Raiford Noble [place], DO   500 mg at 12/01/21 1707   chlorpheniramine-HYDROcodone 10-8 MG/5ML suspension 5 mL  5 mL Oral Q12H PRN Raiford Noble Latif, DO       docusate sodium (COLACE) capsule 100 mg  100 mg Oral BID Sheikh, Omair Latif, DO       fluticasone [facility]) 50 MCG/ACT nasal spray 1 spray  1 spray Each Nare Daily PRN Sheikh, Omair Latif, DO       folic acid (FOLVITE) tablet 1 mg  1 mg Oral Daily Sheikh, Georgina Quint [place], DO   1 mg at 12/01/21 1707   guaiFENesin-dextromethorphan (ROBITUSSIN DM) 100-10 MG/5ML syrup 10 mL  10 mL Oral Q4H PRN Raiford Noble Latif, DO       heparin injection 5,000 Units  5,000 Units Subcutaneous Q8H Raiford Noble [place], DO   5,000 Units at 12/02/21 8546   hydrALAZINE (APRESOLINE) injection 10 mg  10 mg Intravenous Q6H PRN Raiford Noble Latif, DO   10 mg at 12/01/21 1838   insulin aspart (novoLOG) injection 0-15 Units  0-15 Units Subcutaneous TID WC Raiford Noble [place], DO   3 Units at 12/02/21 2703   insulin aspart (novoLOG) injection 0-5 Units  0-5 Units Subcutaneous QHS Raiford Noble [place], DO   2 Units at 12/01/21 2210   Ipratropium-Albuterol (COMBIVENT) respimat 1 puff  1  puff Inhalation Q6H PRN Raiford Noble Latif, DO       levothyroxine (SYNTHROID) tablet 25 mcg  25 mcg Oral Daily Raiford Noble [place], DO   25 mcg at 12/02/21 5009   melatonin tablet 5 mg  5 mg Oral QHS PRN Foust, Katy L, NP   5 mg at 12/02/21 0109   methylPREDNISolone sodium succinate (SOLU-MEDROL) 40 mg/mL injection 32.4 mg  0.5 mg/kg Intravenous Q12H Sheikh, Georgina Quint [place], DO   32.4 mg at 12/02/21 3818   Followed by   Derrill Memo ON 12/04/2021] predniSONE (DELTASONE) tablet 50 mg  50 mg Oral Daily Sheikh, Omair Latif, DO       metoprolol succinate (TOPROL-XL) 24 hr tablet 50 mg  50 mg Oral Daily Raiford Noble [place], DO   50 mg at 12/01/21 1838   molnupiravir EUA (LAGEVRIO) capsule 800 mg  4 capsule Oral BID Raiford Noble [place], DO   800 mg at 12/01/21 2209  multivitamin with minerals tablet 1 tablet  1 tablet Oral Daily Raiford Noble [place], [place]   1 tablet at 12/01/21 1707   ondansetron (ZOFRAN) tablet 4 mg  4 mg Oral Q6H PRN Raiford Noble Latif, DO       Or   ondansetron [facility]) injection 4 mg  4 mg Intravenous Q6H PRN Raiford Noble Latif, DO       oxyCODONE (Oxy IR/ROXICODONE) immediate release tablet 5 mg  5 mg Oral Q4H PRN Sheikh, Omair Latif, DO       pantoprazole (PROTONIX) EC tablet 40 mg  40 mg Oral BID Sheikh, Omair [place], DO   40 mg at 12/01/21 2208   polyethylene glycol (MIRALAX / GLYCOLAX) packet 17 g  17 g Oral Daily PRN Sheikh, Omair Latif, DO       thiamine tablet 100 mg  100 mg Oral Daily Raiford Noble [place], DO   100 mg at 12/01/21 1707   zinc sulfate capsule 220 mg  220 mg Oral Daily Raiford Noble [place], DO   220 mg at 12/01/21 1707    Allergies as of 12/01/2021   (No Known Allergies)    Family History  Problem Relation Age of Onset   Diabetes Brother    Colon cancer Neg Hx    Esophageal cancer Neg Hx    Stomach cancer Neg Hx    Pancreatic cancer Neg Hx    Inflammatory bowel disease Neg Hx    Liver disease Neg Hx    Rectal cancer Neg Hx    Colon polyps Neg Hx     Social  History   Socioeconomic History   Marital status: Married    Spouse name: Not on file   Number of children: Not on file   Years of education: Not on file   Highest education level: Not on file  Occupational History   Not on file  Tobacco Use   Smoking status: Never   Smokeless tobacco: Never  Vaping Use   Vaping Use: Never used  Substance and Sexual Activity   Alcohol use: Never    Comment: once in awhile   Drug use: Not Currently    Types: Marijuana    Comment: Use of THC tea 1-2 times per week   Sexual activity: Not on file  Other Topics Concern   Not on file  Social History Narrative   Not on file   Social Determinants of Health   Financial Resource Strain: Not on file  Food Insecurity: Not on file  Transportation Needs: Not on file  Physical Activity: Not on file  Stress: Not on file  Social Connections: Not on file  Intimate Partner Violence: Not on file    Review of Systems: Pertinent positive and negative review of systems were noted in the above HPI section.  All other review of systems was otherwise negative. Marland Kitchen  Physical Exam: Vital signs in last 24 hours: Temp:  [98 F (36.7 C)-98.9 F (37.2 C)] 98.5 F (36.9 C) (06/02 0553) Pulse Rate:  [72-90] 78 (06/02 0553) Resp:  [16-24] 16 (06/02 0553) BP: (146-229)/(71-102) 148/79 (06/02 0553) SpO2:  [98 %-100 %] 99 % (06/02 0553) Weight:  [65 kg] 65 kg (06/02 0457) Last BM Date : 12/01/21 (per pt) General:   Alert,  Well-developed, older Hispanic female pleasant and cooperative in NAD, tearful Head:  Normocephalic and atraumatic. Eyes:  Sclera clear, no icterus.   Conjunctiva pink. Ears:  Normal auditory acuity. Nose:  No deformity, discharge,  or lesions.  Mouth:  No deformity or lesions.   Neck:  Supple; no masses or thyromegaly. Lungs:  Clear throughout to auscultation.   No wheezes, crackles, or rhonchi.  Heart:  Regular rate and rhythm; no murmurs, clicks, rubs,  or gallops. Abdomen:  Soft,  nontender, nontense ascites, bowel sounds present, no palpable mass and no palpable splenomegaly Rectal:  Deferred  Msk:  Symmetrical without gross deformities. . Pulses:  Normal pulses noted. Extremities:  Without clubbing or edema. Neurologic:  Alert and  oriented x4;  grossly normal neurologically. Skin:  Intact without significant lesions or rashes.. Psych:  Alert and cooperative. Normal mood and affect.  Intake/Output from previous day: 06/01 0701 - 06/02 0700 In: 240 [P.O.:240] Out: -  Intake/Output this shift: No intake/output data recorded.  Lab Results: Recent Labs    12/01/21 1212 12/02/21 0458  WBC 4.3 3.5*  HGB 10.2* 8.5*  HCT 31.3* 25.8*  PLT 109* 96*   BMET Recent Labs    12/01/21 1212 12/02/21 0458  NA 136 135  K 4.1 4.0  CL 109 111  CO2 18* 18*  GLUCOSE 125* 140*  BUN 46* 55*  CREATININE 2.93* 3.21*  CALCIUM 8.5* 8.2*   LFT Recent Labs    12/02/21 0458  PROT 6.3*  ALBUMIN 2.3*  AST 40  ALT 21  ALKPHOS 111  BILITOT 0.7   PT/INR Recent Labs    12/01/21 1435  LABPROT 17.4*  INR 1.4*   Hepatitis Panel No results for input(s): HEPBSAG, HCVAB, HEPAIGM, HEPBIGM in the last 72 hours.   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  Will rule out viral, autoimmune and other inheritable liver disease  Current M ELD=20, M ELD NA -21 parameters being skewed by her chronic kidney disease  No evidence of SBP on diagnostic paracentesis, cytology pending  #3 gastric intestinal metaplasia-followed by GI Very  recent EGD March 2023, no evidence of esophageal varices or portal gastropathy #4 Recent colon cancer screening-very recent colonoscopy poor prep, needs repeat colonoscopy 6 to 12 months #5 acute on chronic kidney disease stage III  #6 diabetes mellitus #7 hypertension with hypertensive urgency on admission #8 normocytic anemia-likely on basis of chronic kidney disease  PLAN: Start 2 g sodium diet restrictions Hepatitis serologies are pending, normal ferritin Have added ANA, mitochondrial antibody, smooth muscle antibodies, ceruloplasmin and alpha-1 antitrypsin Check AFP Does not need EGD as this was recently done as outlined above no varices GI perspective she would benefit from low-dose diuretics however hesitant to initiate at present with worsening renal parameters today and significant chronic kidney disease.  She does not have any peripheral edema today and her ascites is nontense  We discussed discontinuing all alcohol use permanently. We will arrange for outpatient follow-up with Dr. Rush Landmark  for management of cirrhosis Dr. Lyndel Safe to see later today  Amy Esterwood PA-C 12/02/2021, 9:12 AM    Attending physician's note   I have taken history, reviewed the chart and examined the patient. I performed a substantive portion of this encounter, including complete performance of at least one of the key components,  in conjunction with the APP. I agree with the Advanced Practitioner's note, impression and recommendations.   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 R/O other etiologies. MELD Na 21 d/t Cr 3.2 , Alb 2.3, plt 96K  Ascites with generalized anasarca. S/P Dx tap. No SBP. SAAG c/w pHTN. Recent EGD 08/2021 neg for EV. No HE or jaundice.  Asymptomatic COVID-19 positive  Acute on CKD 3. ?HRS vs GN vs prerenal from diuretics. GFR 68ml/min  Associated DM2, HTN  Plan: -2 g sodium normal protein diet. -Stop alcohol strictly. -Avoid  hepatotoxic medications. -Appreciate renal consultation.  Diuretics/fluid management per renal. US renal-P -Check acute hepatitis serology, ANA, AMA, ASMA, ceruloplasmin, A1 AT, AFP. -Outpt FU with Dr. Rush Landmark. -No GI intervention planned.  Will follow peripherally over the weekend.    Carmell Austria, MD [facility] [phone]

## 2021-12-03 DIAGNOSIS — R0609 Other forms of dyspnea: Secondary | ICD-10-CM | POA: Diagnosis not present

## 2021-12-03 DIAGNOSIS — E118 Type 2 diabetes mellitus with unspecified complications: Secondary | ICD-10-CM | POA: Diagnosis not present

## 2021-12-03 DIAGNOSIS — N179 Acute kidney failure, unspecified: Secondary | ICD-10-CM | POA: Diagnosis not present

## 2021-12-03 DIAGNOSIS — K7460 Unspecified cirrhosis of liver: Secondary | ICD-10-CM | POA: Diagnosis not present

## 2021-12-03 LAB — D-DIMER, QUANTITATIVE: D-Dimer, Quant: 7.4 ug/mL-FEU — ABNORMAL HIGH (ref 0.00–0.50)

## 2021-12-03 LAB — CBC WITH DIFFERENTIAL/PLATELET
Abs Immature Granulocytes: 0.03 10*3/uL (ref 0.00–0.07)
Basophils Absolute: 0.1 10*3/uL (ref 0.0–0.1)
Basophils Relative: 1 %
Eosinophils Absolute: 0 10*3/uL (ref 0.0–0.5)
Eosinophils Relative: 1 %
HCT: 26.4 % — ABNORMAL LOW (ref 36.0–46.0)
Hemoglobin: 8.4 g/dL — ABNORMAL LOW (ref 12.0–15.0)
Immature Granulocytes: 0 %
Lymphocytes Relative: 17 %
Lymphs Abs: 1.2 10*3/uL (ref 0.7–4.0)
MCH: 30.9 pg (ref 26.0–34.0)
MCHC: 31.8 g/dL (ref 30.0–36.0)
MCV: 97.1 fL (ref 80.0–100.0)
Monocytes Absolute: 0.6 10*3/uL (ref 0.1–1.0)
Monocytes Relative: 9 %
Neutro Abs: 5.1 10*3/uL (ref 1.7–7.7)
Neutrophils Relative %: 72 %
Platelets: 113 10*3/uL — ABNORMAL LOW (ref 150–400)
RBC: 2.72 MIL/uL — ABNORMAL LOW (ref 3.87–5.11)
RDW: 14.2 % (ref 11.5–15.5)
WBC: 7 10*3/uL (ref 4.0–10.5)
nRBC: 0 % (ref 0.0–0.2)

## 2021-12-03 LAB — IRON AND TIBC
Iron: 21 ug/dL — ABNORMAL LOW (ref 28–170)
Saturation Ratios: 6 % — ABNORMAL LOW (ref 10.4–31.8)
TIBC: 343 ug/dL (ref 250–450)
UIBC: 322 ug/dL

## 2021-12-03 LAB — COMPREHENSIVE METABOLIC PANEL
ALT: 20 U/L (ref 0–44)
AST: 36 U/L (ref 15–41)
Albumin: 2.5 g/dL — ABNORMAL LOW (ref 3.5–5.0)
Alkaline Phosphatase: 107 U/L (ref 38–126)
Anion gap: 9 (ref 5–15)
BUN: 61 mg/dL — ABNORMAL HIGH (ref 8–23)
CO2: 17 mmol/L — ABNORMAL LOW (ref 22–32)
Calcium: 7.9 mg/dL — ABNORMAL LOW (ref 8.9–10.3)
Chloride: 108 mmol/L (ref 98–111)
Creatinine, Ser: 3.39 mg/dL — ABNORMAL HIGH (ref 0.44–1.00)
GFR, Estimated: 14 mL/min — ABNORMAL LOW (ref >60)
Glucose, Bld: 99 mg/dL (ref 70–99)
Potassium: 3.9 mmol/L (ref 3.5–5.1)
Sodium: 134 mmol/L — ABNORMAL LOW (ref 135–145)
Total Bilirubin: 0.6 mg/dL (ref 0.3–1.2)
Total Protein: 6.5 g/dL (ref 6.5–8.1)

## 2021-12-03 LAB — PROTEIN / CREATININE RATIO, URINE
Creatinine, Urine: 66.47 mg/dL
Protein Creatinine Ratio: 6.86 mg/mg{Cre} — ABNORMAL HIGH (ref 0.00–0.15)
Total Protein, Urine: 455.73 mg/dL

## 2021-12-03 LAB — C-REACTIVE PROTEIN: CRP: 0.7 mg/dL (ref <1.0)

## 2021-12-03 LAB — GLUCOSE, CAPILLARY
Glucose-Capillary: 106 mg/dL — ABNORMAL HIGH (ref 70–99)
Glucose-Capillary: 125 mg/dL — ABNORMAL HIGH (ref 70–99)
Glucose-Capillary: 108 mg/dL — ABNORMAL HIGH (ref 70–99)
Glucose-Capillary: 114 mg/dL — ABNORMAL HIGH (ref 70–99)

## 2021-12-03 LAB — FOLATE: Folate: 28.6 ng/mL (ref >5.9)

## 2021-12-03 LAB — RETICULOCYTES
Immature Retic Fract: 14.7 % (ref 2.3–15.9)
RBC.: 2.71 MIL/uL — ABNORMAL LOW (ref 3.87–5.11)
Retic Count, Absolute: 65.6 10*3/uL (ref 19.0–186.0)
Retic Ct Pct: 2.4 % (ref 0.4–3.1)

## 2021-12-03 LAB — ALPHA-1-ANTITRYPSIN: A-1 Antitrypsin, Ser: 181 mg/dL (ref 101–187)

## 2021-12-03 LAB — FERRITIN: Ferritin: 28 ng/mL (ref 11–307)

## 2021-12-03 LAB — CERULOPLASMIN: Ceruloplasmin: 16.4 mg/dL — ABNORMAL LOW (ref 19.0–39.0)

## 2021-12-03 LAB — VITAMIN B12: Vitamin B-12: 931 pg/mL — ABNORMAL HIGH (ref 180–914)

## 2021-12-03 LAB — PHOSPHORUS: Phosphorus: 4.3 mg/dL (ref 2.5–4.6)

## 2021-12-03 LAB — MAGNESIUM: Magnesium: 2.2 mg/dL (ref 1.7–2.4)

## 2021-12-03 LAB — ANA: Anti Nuclear Antibody (ANA): NEGATIVE

## 2021-12-03 NOTE — Progress Notes (Signed)
PROGRESS NOTE    Barbarann Kelly  YOM:[id number] DOB: 03/29/1951 DOA: 12/01/2021 PCP: Trey Sailors, PA   Brief Narrative:  Stacey Chambers is a 71 y.o. female with medical history significant of for but not limited to chronic iron deficiency anemia, diabetes mellitus type 2, hypertension, hyperlipidemia, hypothyroidism, history of CKD stage IIIb as well as other comorbidities who presents with worsening lower extremity swelling and elevated blood pressure.  Patient states that she started feeling fatigued and tired since Friday of last week is noticed that her legs started having worsening swelling.  She states that whenever she ambulates she gets very dyspneic on exertion and very fatigued.  States that she denies any abdominal pain and denies any cough, chest pain or lightheadedness or dizziness.  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  She had lower extremity swelling and given her dyspnea on exertion we will obtain an echocardiogram and evaluate for heart failure.  She is also noted to have AKI on CKD stage IIIb.  Incidentally she was found to be COVID-positive.  Gastroenterology was consulted and [facility] was asked to admit this patient for lower extremity swelling.  Interventional radiology was consulted for a paracentesis and this has been done and she underwent a diagnostic tap.   **Interim History  Her edema has improved however renal function is worsened.  She is not complaining of any chest pain or shortness of breath.  Abdomen is doing okay today.  She has been consulted and doing further work-up and nephrology is also been consulted given her worsening renal function  Assessment and Plan: New onset liver cirrhosis with portal hypertension and associated mild splenomegaly and moderate ascites Mildly elevated  AST -Unclear etiology of her liver cirrhosis so we have consulted GI for further evaluation recommendations -Check acute hepatitis panel and Negative  -Obtaining diagnostic paracentesis for moderate volume ascites and may need a therapeutic thoracentesis; diagnostic paracentesis yielded 180 mils of hazy fluid and this was sent off for fluid analysis and not consistent with SBP -Given IV Lasix 20 mg x 2 and now on gentle IVF with NS at 65 mL/hr -AST was 36 and repeat was 2.2 -Ammonia Level was 20 and PT-INR was 17.4/1.4 respectively  -We will defer diuretics to further GI evaluation recommendations -She has had alcohol in the past but denies any currently; has been placed on Solu-Medrol half a milligram per kilogram every 12 for 3 days given her COVID-positive and will stop -Check echocardiogram and showed EF of 60-65% and G2DD -GIs been consulted for further evaluation recommendations and recommending starting 2 g sodium diet and check an AFP as well as checking an ANA, mitochondrial antibody, smooth muscle antibody, ceruloplasmin and alpha-1 antitrypsin -Does not need an EGD as this was recently done as outlined but she had no varices -GI feels that she would benefit from low-dose diuretics however they are hesitant to initiate given her worsening renal status and significant CKD -She does have a history of alcoholism and use in the past years but GI feels that 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 but they are ruling out other etiologies for her cirrhosis with ascites; her alpha-1 antitrypsin level is within normal limits and rest of the autoimmune work-up is still pending -Ceruloplasmin was 16.4 and on the lower side   Dyspnea on Exertion r/o CHF (Unclear type), improving -  Noted to have an elevated BNP on admission 715 -Placed on Combivent 1 puff IH every 6 scheduled -Check ECHOCardiogram and showed G2DD and EF was 60-65% -Getting IV Lasix 20 mg x2 -D-Dimer Elevated but likely  in the setting of COVID and is now trending upwards -May consider V/Q Scan given D-Dimer being elevated -D/G Chest X-ray showed "Central pulmonary vessels are prominent, possibly suggesting mild CHF. There is no focal pulmonary  consolidation. Small transverse linear density in the left lower lung fields may suggest scarring or subsegmental atelectasis." -We will consider getting a VQ scan if continues to worsen -Repeat CXR in the AM    COVID-19 Disease -Currently asymptomatic and chest x-ray and chest CT scan done -Check inflammatory markers and D-dimer was elevated at 5.88 and fibrinogen was 371 -CXR as Above -Check Blood Cx x2 and Procalcitonin -Inflammatory Marker Trend:  Recent Labs    12/01/21 1704 12/02/21 0458 12/03/21 0553  DDIMER 5.88* 5.66* 7.40*  FERRITIN 27 25 28   CRP 0.9 0.9 0.7    Lab Results  Component Value Date   SARSCOV2NAA POSITIVE (A) 12/01/2021    -SpO2: 100 %; Not requiring any Supplemental O2 via [place]  -Airborne and Contract Precautions -Will place on Combivent 1 puff IH q6h -Started Molnupiravir 800 mg po BID x5 Days; -Antitussives with Robitussin DM and Tussionex  -Started Steroids Solumedrol 0.5 mg/kg x12 hours for 3 days and then 50 mg po daily have now stopped since her CRP is not elevated -Start Zinc and Vitamin C   Hypertensive Urgency -BP was elevated on Admission -C/w Amlodipine 2.5 mg pi Daily and Metoprolol Succinate 50 mg po daily  -Added Hydralazine 10 mg q6hprn for SBP>180 or DBP>100 -Continue to Monitor BP per Protocol -Last BP reading was elevated but improved and 180/84   Hx of Gastric Ulcer/GERD/GI Prophylaxis -C/w PPI BID   Diabetes Mellitus Type 2 -Hold Home Glipizide -Check HbA1c and was 4.9 -Place on Moderate Novolog SSI AC/HS -Continue to Monitor and Trend Blood Sugars carefully  -CBGs ranging from 106-171   Hypothyroidism -Check TSH was 2.793 -C/w Levothyroxine 25 mcg po Daily   AKI on likely CKD stage IIIb with  concern for nephrotic syndrome Metabolic Acidosis Hyperphosphatemia Hyponatremia  -Her baseline creatinine has been around 1.4-2.1 but has basically been CKD stage IIIb -Patient's BUNs/creatinine now is 46/2.93 and worsened to 55/3.21 yesterday and is now slightly worsened to 61/3.39 today And she has a slight metabolic acidosis with a CO2 of 17, anion gap of 9, chloride level 108 -Given 2 doses of IV Lasix 20 mg given her volume overload and will hold off further Diurectis given worsened Renal fxn -Check urine osmolality, urine sodium, urine creatinine, and renal ultrasound -Urine studies sent off and total urine protein was 455.73, urine protein creatinine ratio 6.86, urine osmolality was 380, urine sodium was 48, urine creatinine 66.47 -Renal ultrasound done and showed ". No hydronephrosis. Increased renal echogenicity, suggesting medical renal disease. Cirrhosis and ascites.";  Nephrology evaluated and they feel that her kidneys are small without hydronephrosis -Urine sediment was done and showed numerous dysmorphic RBCs and 1 RBC casts as well as rare granular casts and mild pyuria -Apart from her intra-abdominal ascites he is not volume overloaded and nephrology recommends continue to hold diuretics as she continues to make urine -We will need strict I's and O's and daily weights -Per nephrology she could have hepatorenal syndrome type II versus glomerulonephritis versus others they are to send off serologies -She is at  high risk for ending up on dialysis but she has no uremic signs and does not need dialysis yet.  Nephrology feels that she has signs of glomerulonephritis but doubt a biopsy will help with advanced CKD stage -Since sodium has trended downward from 136 is now 134 -Avoid further nephrotoxic medications, contrast dyes, hypotension and dehydration to ensure adequate renal perfusion and repeat CMP in a.m. -Patient's Phos level is now 5.1 and is now 4.3 -We will discuss with  nephrology about further evaluation Dr. Jonnie Finner recommends obtaining a protein/creatinine urine ratio as well as place a Foley catheter for strict I's and O   Normocytic Anemia -Patient has a history of iron deficiency and take iron supplementation but will hold this for now pending Anemia Panel -Patient's hemoglobin/hematocrit now 10.2/31.3 -> 8.5/25.8 and from yesterday today is relatively stable at 8.4/26.4 -Anemia panel done and showed an iron level of 21, U IBC of 322, TIBC of 343, saturation ratios of 6%, ferritin level 28, folate level of 28.6 and vitamin B12 931 -Continue to monitor for signs and symptoms of bleeding; no overt bleeding noted -Repeat CBC in a.m.   Thrombocytopenia -Patient's Platelet Count is now 109 -> 96 -> 113 and possibly in the setting of splenomegaly and new onset liver cirrhosis -Overt signs or symptoms of bleeding; repeat CBC in a.m.  DVT prophylaxis: heparin injection 5,000 Units Start: 12/01/21 2200 SCDs Start: 12/01/21 1826    Code Status: Full Code Family Communication: No family present at bedside   Disposition Plan:  Level of care: Telemetry Status is: Inpatient Remains inpatient appropriate because: Further work-up and evaluation with nephrology and ensure that her kidneys are stable prior to discharging   Consultants:  Gastroenterology Nephrology  Procedures:  As above  Antimicrobials:  Anti-infectives (From admission, onward)    Start     Dose/Rate Route Frequency Ordered Stop   12/01/21 1900  molnupiravir EUA (LAGEVRIO) capsule 800 mg        4 capsule Oral 2 times daily 12/01/21 1546 12/06/21 2159       Subjective: Seen and examined at bedside and she was sitting there calm and appears like she is doing okay and states that she is making urine.  She is seen with the assistance of the video translator Jana Half [phone].  She denied chest pain or shortness of breath.  States her legs are no longer swollen.  Has some abdominal distention.   Hopeful that she is not going to end up on dialysis.  No other concerns or complaints at this time.  Objective: Vitals:   12/03/21 0500 12/03/21 0518 12/03/21 0939 12/03/21 1247  BP:  (!) 147/76 (!) 155/79 (!) 180/84  Pulse:  64 66 65  Resp:  16 18   Temp:  98.7 F (37.1 C) 98 F (36.7 C) 97.8 F (36.6 C)  TempSrc:  Oral Oral Oral  SpO2:  99% 100% 100%  Weight: 64 kg     Height:        Intake/Output Summary (Last 24 hours) at 12/03/2021 1709 Last data filed at 12/03/2021 1700 Gross per 24 hour  Intake 1776.73 ml  Output 900 ml  Net 876.73 ml   Filed Weights   12/01/21 1600 12/02/21 0457 12/03/21 0500  Weight: 65 kg 65 kg 64 kg   Examination: Physical Exam:  Constitutional: WN/WD overweight Hispanic female in no acute distress appears calm Respiratory: Slightly diminished to auscultation bilaterally, no wheezing, rales, rhonchi or crackles. Normal respiratory effort and patient is not  tachypenic. No accessory muscle use.  Unlabored breathing Cardiovascular: RRR, no murmurs / rubs / gallops. S1 and S2 auscultated. No extremity edema.  Abdomen: Soft, non-tender, distended second by habitus. Bowel sounds positive.  GU: Deferred. Musculoskeletal: No clubbing / cyanosis of digits/nails. No joint deformity upper and lower extremities.  Neurologic: CN 2-12 grossly intact with no focal deficits. Romberg sign and cerebellar reflexes not assessed.  Psychiatric: Normal judgment and insight. Alert and oriented x 3. Normal mood and appropriate affect.   Data Reviewed: I have personally reviewed following labs and imaging studies  CBC: Recent Labs  Lab 12/01/21 1212 12/02/21 0458 12/03/21 0553  WBC 4.3 3.5* 7.0  NEUTROABS 3.3 2.9 5.1  HGB 10.2* 8.5* 8.4*  HCT 31.3* 25.8* 26.4*  MCV 95.4 94.9 97.1  PLT 109* 96* 315*   Basic Metabolic Panel: Recent Labs  Lab 12/01/21 1212 12/02/21 0458 12/03/21 0553  NA 136 135 134*  K 4.1 4.0 3.9  CL 109 111 108  CO2 18* 18* 17*  GLUCOSE  125* 140* 99  BUN 46* 55* 61*  CREATININE 2.93* 3.21* 3.39*  CALCIUM 8.5* 8.2* 7.9*  MG  --  2.1 2.2  PHOS  --  5.1* 4.3   GFR: Estimated Creatinine Clearance: 12.6 mL/min (A) (by C-G formula based on SCr of 3.39 mg/dL (H)). Liver Function Tests: Recent Labs  Lab 12/01/21 1212 12/02/21 0458 12/03/21 0553  AST 43* 40 36  ALT 21 21 20   ALKPHOS 142* 111 107  BILITOT 1.0 0.7 0.6  PROT 7.3 6.3* 6.5  ALBUMIN 2.8* 2.3* 2.5*   Recent Labs  Lab 12/01/21 1212  LIPASE 94*   Recent Labs  Lab 12/01/21 1652  AMMONIA 20   Coagulation Profile: Recent Labs  Lab 12/01/21 1435  INR 1.4*   Cardiac Enzymes: No results for input(s): CKTOTAL, CKMB, CKMBINDEX, TROPONINI in the last 168 hours. BNP (last 3 results) No results for input(s): PROBNP in the last 8760 hours. HbA1C: Recent Labs    12/02/21 0458  HGBA1C 4.9   CBG: Recent Labs  Lab 12/02/21 1655 12/02/21 2126 12/03/21 0747 12/03/21 1127 12/03/21 1703  GLUCAP 171* 127* 106* 125* 108*   Lipid Profile: No results for input(s): CHOL, HDL, LDLCALC, TRIG, CHOLHDL, LDLDIRECT in the last 72 hours. Thyroid Function Tests: Recent Labs    12/02/21 0458  TSH 2.793   Anemia Panel: Recent Labs    12/02/21 0458 12/03/21 0553  VITAMINB12  --  931*  FOLATE  --  28.6  FERRITIN 25 28  TIBC  --  343  IRON  --  21*  RETICCTPCT  --  2.4   Sepsis Labs: Recent Labs  Lab 12/01/21 1704  PROCALCITON <0.10    Recent Results (from the past 240 hour(s))  SARS Coronavirus 2 by RT PCR (hospital order, performed in [facility] hospital lab) *cepheid single result test* Anterior Nasal Swab     Status: Abnormal   Collection Time: 12/01/21 12:12 PM   Specimen: Anterior Nasal Swab  Result Value Ref Range Status   SARS Coronavirus 2 by RT PCR POSITIVE (A) NEGATIVE Final    Comment: (NOTE) SARS-CoV-2 target nucleic acids are DETECTED  SARS-CoV-2 RNA is generally detectable in upper respiratory specimens  during the acute phase  of infection.  Positive results are indicative  of the presence of the identified virus, but do not rule out bacterial infection or co-infection with other pathogens not detected by the test.  Clinical correlation with patient history and  other diagnostic information is necessary to determine patient infection status.  The expected result is negative.  Fact Sheet for Patients:   https://www.patel.info/   Fact Sheet for Healthcare Providers:   https://hall.com/    This test is not yet approved or cleared by the [place] FDA and  has been authorized for detection and/or diagnosis of SARS-CoV-2 by FDA under an Emergency Use Authorization (EUA).  This EUA will remain in effect (meaning this test can be used) for the duration of  the COVID-19 declaration under Section 564(b)(1)  of the Act, 21 U.S.C. section 360-bbb-3(b)(1), unless the authorization is terminated or revoked sooner.   Performed at [facility], [place] [address] [place], [place] [address]   Culture, blood (Routine X 2) w Reflex to ID Panel     Status: None (Preliminary result)   Collection Time: 12/01/21  4:52 PM   Specimen: BLOOD  Result Value Ref Range Status   Specimen Description   Final    BLOOD SITE NOT SPECIFIED Performed at [facility] [address], [place], [place] [address]    Special Requests   Final    BOTTLES DRAWN AEROBIC AND ANAEROBIC Blood Culture results may not be optimal due to an inadequate volume of blood received in culture bottles Performed at [facility] [address], [place], [place] [address]    Culture   Final    NO GROWTH 2 DAYS Performed at [facility] [address] [place], [place] [address]    Report Status PENDING  Incomplete  Culture, blood (Routine X 2) w Reflex to ID Panel     Status: None (Preliminary result)   Collection Time: 12/01/21  8:10 PM   Specimen:  BLOOD  Result Value Ref Range Status   Specimen Description   Final    BLOOD BLOOD RIGHT HAND Performed at [facility] [address] [place], [place] [address]    Special Requests   Final    IN PEDIATRIC BOTTLE Blood Culture results may not be optimal due to an excessive volume of blood received in culture bottles Performed at [facility] [address] [place], [place] [address]    Culture   Final    NO GROWTH 2 DAYS Performed at [facility] [address] [place], [place] [address]    Report Status PENDING  Incomplete  Culture, body fluid w Gram Stain-bottle     Status: None (Preliminary result)   Collection Time: 12/01/21  9:41 PM   Specimen: Peritoneal Washings  Result Value Ref Range Status   Specimen Description PERITONEAL  Final   Special Requests NONE  Final   Culture   Final    NO GROWTH 2 DAYS Performed at [facility], [address], [place], [place] [address]    Report Status PENDING  Incomplete  Gram stain     Status: None   Collection Time: 12/01/21  9:41 PM   Specimen: Peritoneal Washings  Result Value Ref Range Status   Specimen Description PERITONEAL  Final   Special Requests NONE  Final   Gram Stain   Final    NO WBC SEEN NO ORGANISMS SEEN Performed at [facility], [address], [place], [place] [address]    Report Status 12/01/2021 FINAL  Final     Radiology Studies: US RENAL  Result Date: 12/02/2021 CLINICAL DATA:  Acute renal insufficiency. EXAM: RENAL / URINARY  TRACT ULTRASOUND COMPLETE COMPARISON:  Right upper quadrant ultrasound of 12/01/2021. CT 12/01/2021. FINDINGS: Right Kidney: Renal measurements: 7.2 x 4.6 x 3.4 cm = volume: 67 mL. Mildly increased renal echogenicity. Mild renal cortical thinning. No hydronephrosis. Left Kidney: Renal measurements: 7.0 x 4.2 x 5.6 cm = volume: 86 mL. Mildly increased renal echogenicity. Normal cortical thickness. No hydronephrosis. 2.5 cm  lesion within the lower pole was characterized on the MRI of 06/29/2021 as a complex cyst. Bladder: Appears normal for degree of bladder distention. Other: Abdominal ascites and cirrhosis. IMPRESSION: 1. No hydronephrosis. 2. Increased renal echogenicity, suggesting medical renal disease. 3. Cirrhosis and ascites. Electronically Signed   By: Abigail Miyamoto M.D.   On: 12/02/2021 17:49   ECHOCARDIOGRAM COMPLETE  Result Date: 12/02/2021    ECHOCARDIOGRAM REPORT   Patient Name:   Stacey Chambers Date of Exam: 12/02/2021 Medical Rec #:  [id number]             Height:       58.0 in Accession #:    [id number]            Weight:       143.3 lb Date of Birth:  09-01-1950            BSA:          1.581 m Patient Age:    39 years              BP:           148/79 mmHg Patient Gender: F                     HR:           74 bpm. Exam Location:  Inpatient Procedure: 2D Echo, Cardiac Doppler and Color Doppler Indications:    Dyspnea  History:        Patient has no prior history of Echocardiogram examinations.                 Risk Factors:Diabetes and Hypertension.  Sonographer:    Joette Catching RCS Referring Phys: [id number] [facility] LATIF [facility]  Sonographer Comments: Technically challenging study due to limited acoustic windows. IMPRESSIONS  1. Left ventricular ejection fraction, by estimation, is 60 to 65%. The left ventricle has normal function. The left ventricle has no regional wall motion abnormalities. Left ventricular diastolic parameters are consistent with Grade II diastolic dysfunction (pseudonormalization).  2. Right ventricular systolic function is normal. The right ventricular size is normal. There is mildly elevated pulmonary artery systolic pressure.  3. Left atrial size was mildly dilated.  4. The mitral valve is normal in structure. No evidence of mitral valve regurgitation.  5. There is mild calcification of the aortic valve. Aortic valve regurgitation is trivial.  6. The inferior vena cava is normal in size  with greater than 50% respiratory variability, suggesting right atrial pressure of 3 mmHg. Comparison(s): No prior Echocardiogram. FINDINGS  Left Ventricle: Left ventricular ejection fraction, by estimation, is 60 to 65%. The left ventricle has normal function. The left ventricle has no regional wall motion abnormalities. The left ventricular internal cavity size was normal in size. There is  no left ventricular hypertrophy. Left ventricular diastolic parameters are consistent with Grade II diastolic dysfunction (pseudonormalization). Right Ventricle: The right ventricular size is normal. Right ventricular systolic function is normal. There is mildly elevated pulmonary artery systolic pressure. The tricuspid regurgitant velocity is 3.01 m/s, and with an assumed right atrial pressure of  3 mmHg, the estimated right ventricular systolic pressure is 44.8 mmHg. Left Atrium: Left atrial size was mildly dilated. Right Atrium: Right atrial size was normal in size. Pericardium: There is no evidence of pericardial effusion. Mitral Valve: The mitral valve is normal in structure. No evidence of mitral valve regurgitation. Tricuspid Valve: The tricuspid valve is not well visualized. Tricuspid valve regurgitation is mild. Aortic Valve: There is mild calcification of the aortic valve. Aortic valve regurgitation is trivial. Aortic valve mean gradient measures 9.0 mmHg. Aortic valve peak gradient measures 17.8 mmHg. Aortic valve area, by VTI measures 2.52 cm. Pulmonic Valve: The pulmonic valve was not well visualized. Pulmonic valve regurgitation is not visualized. Aorta: The aortic root and ascending aorta are structurally normal, with no evidence of dilitation. Venous: The inferior vena cava is normal in size with greater than 50% respiratory variability, suggesting right atrial pressure of 3 mmHg. IAS/Shunts: No atrial level shunt detected by color flow Doppler.  LEFT VENTRICLE PLAX 2D LVIDd:         4.70 cm   Diastology LVIDs:          3.10 cm   LV e' medial:    8.38 cm/s LV PW:         1.00 cm   LV E/e' medial:  13.6 LV IVS:        0.80 cm   LV e' lateral:   6.85 cm/s LVOT diam:     1.90 cm   LV E/e' lateral: 16.6 LV SV:         105 LV SV Index:   67 LVOT Area:     2.84 cm  RIGHT VENTRICLE             IVC RV Basal diam:  3.10 cm     IVC diam: 1.90 cm RV Mid diam:    1.90 cm RV S prime:     14.50 cm/s TAPSE (M-mode): 2.5 cm LEFT ATRIUM             Index        RIGHT ATRIUM           Index LA diam:        3.50 cm 2.21 cm/m   RA Area:     11.10 cm LA Vol (A2C):   66.0 ml 41.76 ml/m  RA Volume:   19.90 ml  12.59 ml/m LA Vol (A4C):   53.2 ml 33.66 ml/m LA Biplane Vol: 59.4 ml 37.58 ml/m  AORTIC VALVE                     PULMONIC VALVE AV Area (Vmax):    2.08 cm      PV Vmax:       1.21 m/s AV Area (Vmean):   2.09 cm      PV Peak grad:  5.9 mmHg AV Area (VTI):     2.52 cm AV Vmax:           211.00 cm/s AV Vmean:          145.000 cm/s AV VTI:            0.418 m AV Peak Grad:      17.8 mmHg AV Mean Grad:      9.0 mmHg LVOT Vmax:         155.00 cm/s LVOT Vmean:        107.000 cm/s LVOT VTI:          0.371 m  LVOT/AV VTI ratio: 0.89  AORTA Ao Root diam: 2.90 cm MITRAL VALVE                TRICUSPID VALVE MV Area (PHT): 4.49 cm     TR Peak grad:   36.2 mmHg MV Decel Time: 169 msec     TR Vmax:        301.00 cm/s MR Peak grad: 81.0 mmHg MR Vmax:      450.00 cm/s   SHUNTS MV E velocity: 114.00 cm/s  Systemic VTI:  0.37 m MV A velocity: 94.30 cm/s   Systemic Diam: 1.90 cm MV E/A ratio:  1.21 Landscape architect signed by Phineas Inches Signature Date/Time: 12/02/2021/11:32:23 AM    Final     Scheduled Meds:  amLODipine  2.5 mg Oral Daily   vitamin C  500 mg Oral Daily   docusate sodium  100 mg Oral BID   folic acid  1 mg Oral Daily   heparin  5,000 Units Subcutaneous Q8H   insulin aspart  0-15 Units Subcutaneous TID WC   insulin aspart  0-5 Units Subcutaneous QHS   levothyroxine  25 mcg Oral Daily   metoprolol succinate  50 mg  Oral Daily   molnupiravir EUA  4 capsule Oral BID   multivitamin with minerals  1 tablet Oral Daily   pantoprazole  40 mg Oral BID   thiamine  100 mg Oral Daily   zinc sulfate  220 mg Oral Daily   Continuous Infusions:  sodium chloride 65 mL/hr at 12/03/21 1510    LOS: 2 days   Raiford Noble, DO [facility] Available via Epic secure chat 7am-7pm After these hours, please refer to coverage provider listed on [email or web address] 12/03/2021, 5:09 PM

## 2021-12-03 NOTE — Plan of Care (Signed)

## 2021-12-03 NOTE — Progress Notes (Addendum)
[facility] Progress Note  Subjective: seen in room, no c/o's, no SOB, no n/v, no confusion or sleepiness. UOP 350 cc yest and 300 cc so far today. Creat 3.2 yest > 3.3 today. BP's are good.   Vitals:   12/02/21 2053 12/03/21 0500 12/03/21 0518 12/03/21 0939  BP: (!) 156/78  (!) 147/76 (!) 155/79  Pulse: 64  64 66  Resp: '20  16 18  ' Temp: 98.9 F (37.2 C)  98.7 F (37.1 C) 98 F (36.7 C)  TempSrc: Oral  Oral Oral  SpO2: 100%  99% 100%  Weight:  64 kg    Height:        Exam: Gen alert, no distress No jvd or bruits Chest clear bilat to bases RRR no RG Abd soft, probable 2+ ascites, no mass or HSM,  +bs Ext no LE or UE edema Neuro is alert, Ox 3 , nf          Home meds include - norvasc 2.5, esomeprazole, ferous gluconate, fluticasone, levothyroxine, meteprolol succinate xl 50 qd, ergocalciferol       Date                         Creat               eGFR    July 2022                  1.41                 41 ml/min     Sept 2022                 1.69                 31 ml/min    Oct 2022                  1.85                     Feb 2023                  2.17                 23 ml/min    12/01/21                     2.93    12/02/21                     3.21                 15            Rec'd IV lasix 20 mg x 2 on 6/01     UA - >50 rbcs, 0-5 wbcs, no bacteria, 0-5 epis, clear, yellow, mod Hb, > 300 prot     CXR - 6/01 > IMPRESSION: Central pulmonary vessels are prominent, possibly suggesting mild CHF. There is no focal pulmonary consolidation. Small transverse linear density in the left lower lung fields may suggest scarring or subsegmental atelectasis   UP/C ratio = 6.8 gm    UNa 48,  UCr 86    Renal US - 7.2 and 7.0 kidneys w/o hydro, + echotexture bilat    Urine sediment (6/03, undersigned) showed numerous dysmorphic RBC's, one rbc cast and rare granular casts, mild pyuria.        Assessment/ Plan:  AKI on CKD IV - b/l creat 2.1 from Feb 2023, eGFR 23  ml/min.  Creat here 2.9 on admit 6/01 when she presented w/ LE edema, ^BP, fatigue. She was found to have cirrhosis here w/ portal HTN (large spleen, ascites). UP/C ratio = 6 gm. UA shows >50 rbc, prot > 300. Renal US shows small kidneys w/o hydro. Urine sediment showed numerous dysmorphic RBC's, one rbc cast and rare granular casts, mild pyuria. BP's wnl to high. Got IV lasix x2 6/01. Creat up 3.2 yest and 3.3 today. Apart from ascites pt not vol overloaded so would cont to hold diuretics. Making urine. Could have HRS type 2 (nl bp, slower course) vs GN vs other. Will send off serologies. No uremic signs, no need for HD yet. 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 Cont IVF"s at 65 cc/hr. Had long discussion w/ pt about her kidney problems and possible need for HD soon via interpreter today. Will follow.  New onset cirrhosis/ portal HTN - w/ ascites Resp - CXR does not look like edema. Lungs clear, on RA, no SOB.  COVID -19 disease HTNsive urgency - resolved, BP's better.  Cont norvasc and metoprolol  Hx GU/ GERD Hypothyroidism           Rob Sergio Hobart 12/03/2021, 12:30 PM   Recent Labs  Lab 12/02/21 0458 12/03/21 0553  HGB 8.5* 8.4*  ALBUMIN 2.3* 2.5*  CALCIUM 8.2* 7.9*  PHOS 5.1* 4.3  CREATININE 3.21* 3.39*  K 4.0 3.9   Inpatient medications:  amLODipine  2.5 mg Oral Daily   vitamin C  500 mg Oral Daily   docusate sodium  100 mg Oral BID   folic acid  1 mg Oral Daily   heparin  5,000 Units Subcutaneous Q8H   insulin aspart  0-15 Units Subcutaneous TID WC   insulin aspart  0-5 Units Subcutaneous QHS   levothyroxine  25 mcg Oral Daily   metoprolol succinate  50 mg Oral Daily   molnupiravir EUA  4 capsule Oral BID   multivitamin with minerals  1 tablet Oral Daily   pantoprazole  40 mg Oral BID   thiamine  100 mg Oral Daily   zinc sulfate  220 mg Oral Daily    sodium chloride 65 mL/hr at 12/03/21 0555   acetaminophen  **OR** acetaminophen, chlorpheniramine-HYDROcodone, fluticasone, guaiFENesin-dextromethorphan, hydrALAZINE, Ipratropium-Albuterol, melatonin, ondansetron **OR** ondansetron (ZOFRAN) IV, oxyCODONE, polyethylene glycol

## 2021-12-04 ENCOUNTER — Inpatient Hospital Stay (HOSPITAL_COMMUNITY): Payer: Medicare Other

## 2021-12-04 DIAGNOSIS — R0609 Other forms of dyspnea: Secondary | ICD-10-CM | POA: Diagnosis not present

## 2021-12-04 DIAGNOSIS — N179 Acute kidney failure, unspecified: Secondary | ICD-10-CM | POA: Diagnosis not present

## 2021-12-04 DIAGNOSIS — K7030 Alcoholic cirrhosis of liver without ascites: Secondary | ICD-10-CM | POA: Diagnosis not present

## 2021-12-04 DIAGNOSIS — E118 Type 2 diabetes mellitus with unspecified complications: Secondary | ICD-10-CM | POA: Diagnosis not present

## 2021-12-04 LAB — LIPASE, FLUID: Lipase-Fluid: 67 U/L

## 2021-12-04 LAB — CBC WITH DIFFERENTIAL/PLATELET
Abs Immature Granulocytes: 0.02 10*3/uL (ref 0.00–0.07)
Basophils Absolute: 0 10*3/uL (ref 0.0–0.1)
Basophils Relative: 1 %
Eosinophils Absolute: 0.1 10*3/uL (ref 0.0–0.5)
Eosinophils Relative: 1 %
HCT: 25.3 % — ABNORMAL LOW (ref 36.0–46.0)
Hemoglobin: 8 g/dL — ABNORMAL LOW (ref 12.0–15.0)
Immature Granulocytes: 0 %
Lymphocytes Relative: 15 %
Lymphs Abs: 0.9 10*3/uL (ref 0.7–4.0)
MCH: 30.7 pg (ref 26.0–34.0)
MCHC: 31.6 g/dL (ref 30.0–36.0)
MCV: 96.9 fL (ref 80.0–100.0)
Monocytes Absolute: 0.6 10*3/uL (ref 0.1–1.0)
Monocytes Relative: 10 %
Neutro Abs: 4.1 10*3/uL (ref 1.7–7.7)
Neutrophils Relative %: 73 %
Platelets: 103 10*3/uL — ABNORMAL LOW (ref 150–400)
RBC: 2.61 MIL/uL — ABNORMAL LOW (ref 3.87–5.11)
RDW: 14.3 % (ref 11.5–15.5)
WBC: 5.6 10*3/uL (ref 4.0–10.5)
nRBC: 0 % (ref 0.0–0.2)

## 2021-12-04 LAB — COMPREHENSIVE METABOLIC PANEL
ALT: 24 U/L (ref 0–44)
AST: 40 U/L (ref 15–41)
Albumin: 2.3 g/dL — ABNORMAL LOW (ref 3.5–5.0)
Alkaline Phosphatase: 97 U/L (ref 38–126)
Anion gap: 7 (ref 5–15)
BUN: 66 mg/dL — ABNORMAL HIGH (ref 8–23)
CO2: 17 mmol/L — ABNORMAL LOW (ref 22–32)
Calcium: 7.7 mg/dL — ABNORMAL LOW (ref 8.9–10.3)
Chloride: 112 mmol/L — ABNORMAL HIGH (ref 98–111)
Creatinine, Ser: 3.46 mg/dL — ABNORMAL HIGH (ref 0.44–1.00)
GFR, Estimated: 14 mL/min — ABNORMAL LOW (ref >60)
Glucose, Bld: 105 mg/dL — ABNORMAL HIGH (ref 70–99)
Potassium: 4.1 mmol/L (ref 3.5–5.1)
Sodium: 136 mmol/L (ref 135–145)
Total Bilirubin: 0.6 mg/dL (ref 0.3–1.2)
Total Protein: 5.9 g/dL — ABNORMAL LOW (ref 6.5–8.1)

## 2021-12-04 LAB — C4 COMPLEMENT: Complement C4, Body Fluid: 12 mg/dL (ref 12–38)

## 2021-12-04 LAB — GLUCOSE, CAPILLARY
Glucose-Capillary: 113 mg/dL — ABNORMAL HIGH (ref 70–99)
Glucose-Capillary: 114 mg/dL — ABNORMAL HIGH (ref 70–99)
Glucose-Capillary: 132 mg/dL — ABNORMAL HIGH (ref 70–99)
Glucose-Capillary: 119 mg/dL — ABNORMAL HIGH (ref 70–99)

## 2021-12-04 LAB — MITOCHONDRIAL ANTIBODIES: Mitochondrial M2 Ab, IgG: <20 Units (ref 0.0–20.0)

## 2021-12-04 LAB — C3 COMPLEMENT: C3 Complement: 91 mg/dL (ref 82–167)

## 2021-12-04 LAB — PHOSPHORUS: Phosphorus: 4.1 mg/dL (ref 2.5–4.6)

## 2021-12-04 LAB — D-DIMER, QUANTITATIVE: D-Dimer, Quant: 4.45 ug/mL-FEU — ABNORMAL HIGH (ref 0.00–0.50)

## 2021-12-04 LAB — C-REACTIVE PROTEIN: CRP: 0.7 mg/dL (ref <1.0)

## 2021-12-04 LAB — FERRITIN: Ferritin: 31 ng/mL (ref 11–307)

## 2021-12-04 LAB — MAGNESIUM: Magnesium: 2 mg/dL (ref 1.7–2.4)

## 2021-12-04 MED ORDER — STERILE WATER FOR INJECTION IV SOLN
INTRAVENOUS | Status: DC
  Filled 2021-12-04 (×3): qty 150

## 2021-12-04 MED ORDER — MELATONIN 3 MG PO TABS
3.0000 mg | ORAL_TABLET | Freq: Once | ORAL | Status: DC
  Filled 2021-12-04: qty 1

## 2021-12-04 NOTE — Plan of Care (Signed)

## 2021-12-04 NOTE — Progress Notes (Addendum)
[facility] Progress Note  Subjective: UOP better yest up to 950 cc. Creat 3.4 today (3.3 yesterday).   Patient not seen directly today given COVID-19 + status, utilizing data taken from chart +/- discussions w/ providers and staff.    Vitals:   12/04/21 0500 12/04/21 0557 12/04/21 0958 12/04/21 1408  BP:  (!) 161/77 (!) 172/81 (!) 143/72  Pulse:  78 76 77  Resp:  18  18  Temp:  98.5 F (36.9 C)  98.5 F (36.9 C)  TempSrc:  Oral  Oral  SpO2:  99%  100%  Weight: 67.5 kg     Height:        Exam: Patient not seen directly today given COVID-19 + status, utilizing data taken from chart +/- discussions w/ providers and staff.     Home meds include - norvasc 2.5, esomeprazole, ferous gluconate, fluticasone, levothyroxine, meteprolol succinate xl 50 qd, ergocalciferol       Date                         Creat               eGFR    July 2022                  1.41                 41 ml/min     Sept 2022                 1.69                 31 ml/min    Oct 2022                  1.85                     Feb 2023                  2.17                 23 ml/min    12/01/21                     2.93    12/02/21                     3.21                 15            Rec'd IV lasix 20 mg x 2 on 6/01     UA - >50 rbcs, 0-5 wbcs, no bacteria, 0-5 epis, clear, yellow, mod Hb, > 300 prot     CXR - 6/01 > IMPRESSION: Central pulmonary vessels are prominent, possibly suggesting mild CHF. There is no focal pulmonary consolidation. Small transverse linear density in the left lower lung fields may suggest scarring or subsegmental atelectasis   UP/C ratio = 6.8 gm    UNa 48,  UCr 86    Renal US - 7.2 and 7.0 kidneys w/o hydro, + echotexture bilat    Urine sediment (6/03, undersigned) showed numerous dysmorphic RBC's, one rbc cast and rare granular casts, mild pyuria.        Assessment/ Plan: AKI on CKD IV - b/l creat 2.1 from Feb 2023, eGFR 23 ml/min. Looks like renal function have  been declining steadily over the  past year. Does not see a nephrologist. Creat here 2.9 on admit 6/01 when she presented w/ LE edema, ^BP, fatigue. She was found to have cirrhosis here w/ signs of portal HTN. UP/C ratio = 6 gm. UA shows >50 rbc, prot > 300. Renal US shows small kidneys w/o hydro. Urine sediment showed numerous dysmorphic RBC's, one rbc cast, several granular casts, mild pyuria. BP's wnl to high. Got IV lasix x2 6/01. Apart from ascites pt not vol overloaded so would cont to hold diuretics. Could have HRS type 2 (nl bp, slower course) vs GN vs other. No uremic signs, no need for HD. Creat appears to be leveling off.  Has proteinuria and hematuria suggesting GN. Serologies sent off. Not sure if biopsy will help w/ advanced CKD and small kidneys. UOP improved w/ fluids, but wts up so will lower to 40 cc/cr. Hopefully will improve. Will follow.  New diagnosis cirrhosis/ portal HTN  Resp - CXR does not look like edema. Lungs clear on last exam, on RA.  COVID -19 disease - per pmd HTNsive urgency - resolved, BP's better.  Cont norvasc and metoprolol  Hx GU/ GERD Hypothyroidism           Stacey Chambers 12/04/2021, 4:26 PM   Recent Labs  Lab 12/03/21 0553 12/04/21 0541  HGB 8.4* 8.0*  ALBUMIN 2.5* 2.3*  CALCIUM 7.9* 7.7*  PHOS 4.3 4.1  CREATININE 3.39* 3.46*  K 3.9 4.1    Inpatient medications:  amLODipine  2.5 mg Oral Daily   vitamin C  500 mg Oral Daily   docusate sodium  100 mg Oral BID   folic acid  1 mg Oral Daily   heparin  5,000 Units Subcutaneous Q8H   insulin aspart  0-15 Units Subcutaneous TID WC   insulin aspart  0-5 Units Subcutaneous QHS   levothyroxine  25 mcg Oral Daily   melatonin  3 mg Oral Once   metoprolol succinate  50 mg Oral Daily   molnupiravir EUA  4 capsule Oral BID   multivitamin with minerals  1 tablet Oral Daily   pantoprazole  40 mg Oral BID   thiamine  100 mg Oral Daily   zinc sulfate  220 mg Oral Daily     sodium bicarbonate (isotonic)  infusion in sterile water 65 mL/hr at 12/04/21 1040   acetaminophen **OR** acetaminophen, chlorpheniramine-HYDROcodone, fluticasone, guaiFENesin-dextromethorphan, hydrALAZINE, Ipratropium-Albuterol, melatonin, ondansetron **OR** ondansetron (ZOFRAN) IV, oxyCODONE, polyethylene glycol

## 2021-12-04 NOTE — Progress Notes (Signed)
PROGRESS NOTE    Stacey Chambers  WUJ:[id number] DOB: 12-23-1950 DOA: 12/01/2021 PCP: Trey Sailors, PA   Brief Narrative:  Stacey Chambers is a 71 y.o. female with medical history significant of for but not limited to chronic iron deficiency anemia, diabetes mellitus type 2, hypertension, hyperlipidemia, hypothyroidism, history of CKD stage IIIb as well as other comorbidities who presents with worsening lower extremity swelling and elevated blood pressure.  Patient states that she started feeling fatigued and tired since Friday of last week is noticed that her legs started having worsening swelling.  She states that whenever she ambulates she gets very dyspneic on exertion and very fatigued.  States that she denies any abdominal pain and denies any cough, chest pain or lightheadedness or dizziness.  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  She had lower extremity swelling and given her dyspnea on exertion we will obtain an echocardiogram and evaluate for heart failure.  She is also noted to have AKI on CKD stage IIIb.  Incidentally she was found to be COVID-positive.  Gastroenterology was consulted and [facility] was asked to admit this patient for lower extremity swelling.  Interventional radiology was consulted for a paracentesis and this has been done and she underwent a diagnostic tap.   **Interim History  Her edema has improved however renal function is worsened.  She is not complaining of any chest pain or shortness of breath.  Abdomen is doing okay today.  She has been consulted and doing further work-up and nephrology is also been consulted given her worsening renal function   Nephrology has placed the patient on low-dose IV fluids at 65 MLS per hour but now have reduced it to 40 mL/hour now that creatinine is leveling off.   Chest x-ray done today and "New symmetric airspace disease, most likely due to pulmonary edema. Suspect small layering bilateral pleural effusions" to keep a close eye on this.   Assessment and Plan:  New onset liver cirrhosis with portal hypertension and associated mild splenomegaly and moderate ascites Mildly elevated AST -Unclear etiology of her liver cirrhosis so we have consulted GI for further evaluation recommendations -Check acute hepatitis panel and Negative  -Obtaining diagnostic paracentesis for moderate volume ascites and may need a therapeutic thoracentesis; diagnostic paracentesis yielded 180 mils of hazy fluid and this was sent off for fluid analysis and not consistent with SBP -Given IV Lasix 20 mg x 2 and now on gentle IVF with NS at 65 mL/hr and it will be reduced to 40 mL/hr -AST was 36 and repeat was 40 -Ammonia Level was 20 and PT-INR was 17.4/1.4 respectively  -We will defer diuretics to further GI evaluation recommendations -She has had alcohol in the past but denies any currently; has been placed on Solu-Medrol half a milligram per kilogram every 12 for 3 days given her COVID-positive and will stop -Check echocardiogram and showed EF of 60-65% and G2DD -GIs been consulted for further evaluation recommendations and recommending starting 2 g sodium diet and check an AFP as well as checking an ANA, mitochondrial antibody, smooth muscle antibody, ceruloplasmin and alpha-1 antitrypsin -Does not need an EGD as this was recently done as outlined but she had no varices -GI feels that she would benefit from low-dose diuretics however they are hesitant to initiate given her worsening renal status and significant CKD -She does have a  history of alcoholism and use in the past years but GI feels that 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 but they are ruling out other etiologies for her cirrhosis with ascites; her alpha-1 antitrypsin level is within normal limits and rest  of the autoimmune work-up is still pending -Ceruloplasmin was 16.4 and on the lower side -Further Care per GI    Dyspnea on Exertion r/o CHF (Unclear type), improving -Noted to have an elevated BNP on admission 715 -Placed on Combivent 1 puff IH every 6 scheduled -Check ECHOCardiogram and showed G2DD and EF was 60-65% -Received IV Lasix 20 mg x2 and now on IVF -D-Dimer Elevated but likely in the setting of COVID and is now trending upwards -May consider V/Q Scan given D-Dimer being elevated -D/G Chest X-ray showed "Central pulmonary vessels are prominent, possibly suggesting mild CHF. There is no focal pulmonary  consolidation. Small transverse linear density in the left lower lung fields may suggest scarring or subsegmental atelectasis." -SpO2: 100 % -We will consider getting a VQ scan if continues to worsen -Repeat CXR this AM showed "New symmetric airspace disease, most likely due to pulmonary edema. Suspect small layering bilateral pleural effusions."   COVID-19 Disease -Currently asymptomatic and chest x-ray and chest CT scan done -Check inflammatory markers and D-dimer was elevated at 5.88 and fibrinogen was 371 -CXR as Above -Check Blood Cx x2 and Procalcitonin -Inflammatory Marker Trend:  Recent Labs    12/02/21 0458 12/03/21 0553 12/04/21 0541  DDIMER 5.66* 7.40* 4.45*  FERRITIN 25 28 31   CRP 0.9 0.7 0.7    Lab Results  Component Value Date   SARSCOV2NAA POSITIVE (A) 12/01/2021      -SpO2: 100 %; Not requiring any Supplemental O2 via [place]  -Airborne and Contract Precautions -Will place on Combivent 1 puff IH q6h -Started Molnupiravir 800 mg po BID x5 Days; -Antitussives with Robitussin DM and Tussionex  -Started Steroids Solumedrol 0.5 mg/kg x12 hours for 3 days and then 50 mg po daily have now stopped since her CRP is not elevated -Start Zinc and Vitamin C   Hypertensive Urgency -BP was elevated on Admission -C/w Amlodipine 2.5 mg pi Daily and Metoprolol  Succinate 50 mg po daily  -Added Hydralazine 10 mg q6hprn for SBP>180 or DBP>100 -Continue to Monitor BP per Protocol -Last BP reading was elevated but improved and 143/72   Hx of Gastric Ulcer/GERD/GI Prophylaxis -C/w PPI BID   Diabetes Mellitus Type 2 -Hold Home Glipizide -Check HbA1c and was 4.9 -Place on Moderate Novolog SSI AC/HS -Continue to Monitor and Trend Blood Sugars carefully  -CBGs ranging from 113-132   Hypothyroidism -Check TSH was 2.793 -C/w Levothyroxine 25 mcg po Daily   AKI on likely CKD stage IIIb with concern for nephrotic syndrome Metabolic Acidosis Hyperphosphatemia Hyponatremia  -Her baseline creatinine has been around 1.4-2.1 but has basically been CKD stage IIIb -Patient's BUNs/creatinine now is 46/2.93 and worsened to 55/3.21 yesterday and is now slightly worsened to 61/3.39 today And she has a slight metabolic acidosis with a CO2 of 17, anion gap of 7, chloride level 112 -Given 2 doses of IV Lasix 20 mg given her volume overload and will hold off further Diurectis given worsened Renal fxn and now nephrology has started the patient on IV fluid with normal saline at 65 mils per hour and going to reduce the rate to 40 mils per hour -Check urine osmolality, urine sodium, urine creatinine, and renal ultrasound -Urine studies sent off and total urine  protein was 455.73, urine protein creatinine ratio 6.86, urine osmolality was 380, urine sodium was 48, urine creatinine 66.47 -Renal ultrasound done and showed ". No hydronephrosis. Increased renal echogenicity, suggesting medical renal disease. Cirrhosis and ascites.";  Nephrology evaluated and they feel that her kidneys are small without hydronephrosis -Urine sediment was done and showed numerous dysmorphic RBCs and 1 RBC casts as well as rare granular casts and mild pyuria -Apart from her intra-abdominal ascites he is not volume overloaded and nephrology recommends continue to hold diuretics as she continues to  make urine -We will need strict I's and O's and daily weights -Per nephrology she could have hepatorenal syndrome type II versus glomerulonephritis versus others they are to send off serologies -She is at high risk for ending up on dialysis but she has no uremic signs and does not need dialysis yet.  Nephrology feels that she has signs of glomerulonephritis but doubt a biopsy will help with advanced CKD stage -Sodium is now 136 -Avoid further nephrotoxic medications, contrast dyes, hypotension and dehydration to ensure adequate renal perfusion and repeat CMP in a.m. -Patient's Phos level is now 5.1 -> 4.3 -> 4.1 -We will discuss with nephrology about further evaluation Dr. Jonnie Finner recommends obtaining a protein/creatinine urine ratio as well as place a Foley catheter for strict I's and O   Normocytic Anemia -Patient has a history of iron deficiency and take iron supplementation but will hold this for now pending Anemia Panel -Patient's hemoglobin/hematocrit now 10.2/31.3 -> 8.5/25.8 -> 8.4/26.4 -> 8.0/25.3 -Anemia panel done and showed an iron level of 21, U IBC of 322, TIBC of 343, saturation ratios of 6%, ferritin level 28, folate level of 28.6 and vitamin B12 931 -Continue to monitor for signs and symptoms of bleeding; no overt bleeding noted -Repeat CBC in a.m.  Hypoalbuminemia -Patient's Albumin Level went from 2.5 -> 2.3 -Continue to Monitor and Trend -Repeat CMP in the AM    Thrombocytopenia -Patient's Platelet Count is now 109 -> 96 -> 113 -> 103 and possibly in the setting of splenomegaly and new onset liver cirrhosis -Overt signs or symptoms of bleeding; repeat CBC in a.m.  DVT prophylaxis: heparin injection 5,000 Units Start: 12/01/21 2200 SCDs Start: 12/01/21 1826    Code Status: Full Code Family Communication: No family currently at bedside   Disposition Plan:  Level of care: Telemetry Status is: Inpatient Remains inpatient appropriate because: Improvement and clearance  by nephrology and GI   Consultants:  Nephrology Gastroenterology  Procedures:  Renal ultrasound  Antimicrobials:  Anti-infectives (From admission, onward)    Start     Dose/Rate Route Frequency Ordered Stop   12/01/21 1900  molnupiravir EUA (LAGEVRIO) capsule 800 mg        4 capsule Oral 2 times daily 12/01/21 1546 12/06/21 2159       Subjective: Seen and examined at bedside with assistance of the video translator Altha Harm [phone].  Patient states that she is doing overall well.  Denies chest pain or shortness of breath.  No lightheadedness or dizziness.  States her legs are fine.  No other concerns at this time and is urinating.  Objective: Vitals:   12/04/21 0500 12/04/21 0557 12/04/21 0958 12/04/21 1408  BP:  (!) 161/77 (!) 172/81 (!) 143/72  Pulse:  78 76 77  Resp:  18  18  Temp:  98.5 F (36.9 C)  98.5 F (36.9 C)  TempSrc:  Oral  Oral  SpO2:  99%  100%  Weight: 67.5 kg  Height:        Intake/Output Summary (Last 24 hours) at 12/04/2021 1648 Last data filed at 12/04/2021 1642 Gross per 24 hour  Intake 879.82 ml  Output 1075 ml  Net -195.18 ml   Filed Weights   12/02/21 0457 12/03/21 0500 12/04/21 0500  Weight: 65 kg 64 kg 67.5 kg   Examination: Physical Exam:  Constitutional: WN/WD Hispanic Caucasian female in no acute distress appears calm Respiratory: Diminished to auscultation bilaterally, no wheezing, rales, rhonchi or crackles. Normal respiratory effort and patient is not tachypenic. No accessory muscle use.  Unlabored breathing Cardiovascular: RRR, no murmurs / rubs / gallops. S1 and S2 auscultated. Abdomen: Soft, mildly-tender, distended secondary body habitus. Bowel sounds positive.  GU: Deferred. Musculoskeletal: No clubbing / cyanosis of digits/nails. No joint deformity upper and lower extremities.  Neurologic: CN 2-12 grossly intact with no focal deficits. Romberg sign cerebellar reflexes not assessed.  Psychiatric: Normal judgment and insight.  Alert and oriented x 3. Normal mood and appropriate affect.   Data Reviewed: I have personally reviewed following labs and imaging studies  CBC: Recent Labs  Lab 12/01/21 1212 12/02/21 0458 12/03/21 0553 12/04/21 0541  WBC 4.3 3.5* 7.0 5.6  NEUTROABS 3.3 2.9 5.1 4.1  HGB 10.2* 8.5* 8.4* 8.0*  HCT 31.3* 25.8* 26.4* 25.3*  MCV 95.4 94.9 97.1 96.9  PLT 109* 96* 113* 865*   Basic Metabolic Panel: Recent Labs  Lab 12/01/21 1212 12/02/21 0458 12/03/21 0553 12/04/21 0541  NA 136 135 134* 136  K 4.1 4.0 3.9 4.1  CL 109 111 108 112*  CO2 18* 18* 17* 17*  GLUCOSE 125* 140* 99 105*  BUN 46* 55* 61* 66*  CREATININE 2.93* 3.21* 3.39* 3.46*  CALCIUM 8.5* 8.2* 7.9* 7.7*  MG  --  2.1 2.2 2.0  PHOS  --  5.1* 4.3 4.1   GFR: Estimated Creatinine Clearance: 12.7 mL/min (A) (by C-G formula based on SCr of 3.46 mg/dL (H)). Liver Function Tests: Recent Labs  Lab 12/01/21 1212 12/02/21 0458 12/03/21 0553 12/04/21 0541  AST 43* 40 36 40  ALT 21 21 20 24   ALKPHOS 142* 111 107 97  BILITOT 1.0 0.7 0.6 0.6  PROT 7.3 6.3* 6.5 5.9*  ALBUMIN 2.8* 2.3* 2.5* 2.3*   Recent Labs  Lab 12/01/21 1212  LIPASE 94*   Recent Labs  Lab 12/01/21 1652  AMMONIA 20   Coagulation Profile: Recent Labs  Lab 12/01/21 1435  INR 1.4*   Cardiac Enzymes: No results for input(s): CKTOTAL, CKMB, CKMBINDEX, TROPONINI in the last 168 hours. BNP (last 3 results) No results for input(s): PROBNP in the last 8760 hours. HbA1C: Recent Labs    12/02/21 0458  HGBA1C 4.9   CBG: Recent Labs  Lab 12/03/21 1703 12/03/21 2202 12/04/21 0739 12/04/21 1143 12/04/21 1635  GLUCAP 108* 114* 113* 114* 132*   Lipid Profile: No results for input(s): CHOL, HDL, LDLCALC, TRIG, CHOLHDL, LDLDIRECT in the last 72 hours. Thyroid Function Tests: Recent Labs    12/02/21 0458  TSH 2.793   Anemia Panel: Recent Labs    12/03/21 0553 12/04/21 0541  VITAMINB12 931*  --   FOLATE 28.6  --   FERRITIN 28 31   TIBC 343  --   IRON 21*  --   RETICCTPCT 2.4  --    Sepsis Labs: Recent Labs  Lab 12/01/21 1704  PROCALCITON <0.10    Recent Results (from the past 240 hour(s))  SARS Coronavirus 2 by RT PCR (hospital order,  performed in [facility] hospital lab) *cepheid single result test* Anterior Nasal Swab     Status: Abnormal   Collection Time: 12/01/21 12:12 PM   Specimen: Anterior Nasal Swab  Result Value Ref Range Status   SARS Coronavirus 2 by RT PCR POSITIVE (A) NEGATIVE Final    Comment: (NOTE) SARS-CoV-2 target nucleic acids are DETECTED  SARS-CoV-2 RNA is generally detectable in upper respiratory specimens  during the acute phase of infection.  Positive results are indicative  of the presence of the identified virus, but do not rule out bacterial infection or co-infection with other pathogens not detected by the test.  Clinical correlation with patient history and  other diagnostic information is necessary to determine patient infection status.  The expected result is negative.  Fact Sheet for Patients:   https://www.patel.info/   Fact Sheet for Healthcare Providers:   https://hall.com/    This test is not yet approved or cleared by the [place] FDA and  has been authorized for detection and/or diagnosis of SARS-CoV-2 by FDA under an Emergency Use Authorization (EUA).  This EUA will remain in effect (meaning this test can be used) for the duration of  the COVID-19 declaration under Section 564(b)(1)  of the Act, 21 U.S.C. section 360-bbb-3(b)(1), unless the authorization is terminated or revoked sooner.   Performed at [facility], [place] [address] [place], [place] [address]   Fungus Culture With Stain     Status: None (Preliminary result)   Collection Time: 12/01/21  4:27 PM   Specimen: Abdomen; Peritoneal Fluid  Result Value Ref Range Status   Fungus Stain Final report  Final    Comment:  (NOTE) Performed At: [facility] [address], [place] [id number] Rush Farmer MD YT:[id number]    Fungus (Mycology) Culture PENDING  Incomplete   Fungal Source PERITONEAL  Final    Comment: Performed at [facility], [place] [address] [place], [place] [address]  Fungus Culture Result     Status: None   Collection Time: 12/01/21  4:27 PM  Result Value Ref Range Status   Result 1 Comment  Final    Comment: (NOTE) KOH/Calcofluor preparation:  no fungus observed. Performed At: [facility] [place], [place] [id number] Rush Farmer MD CB:[id number]   Culture, blood (Routine X 2) w Reflex to ID Panel     Status: None (Preliminary result)   Collection Time: 12/01/21  4:52 PM   Specimen: BLOOD  Result Value Ref Range Status   Specimen Description   Final    BLOOD SITE NOT SPECIFIED Performed at [place] [address] [place], [place] [address]    Special Requests   Final    BOTTLES DRAWN AEROBIC AND ANAEROBIC Blood Culture results may not be optimal due to an inadequate volume of blood received in culture bottles Performed at [place] [address] [place], [place] [address]    Culture   Final    NO GROWTH 3 DAYS Performed at [facility], [place] [address] [place], [place] [address]    Report Status PENDING  Incomplete  Culture, blood (Routine X 2) w Reflex to ID Panel     Status: None (Preliminary result)   Collection Time: 12/01/21  8:10 PM   Specimen: BLOOD  Result Value Ref Range Status   Specimen Description   Final    BLOOD BLOOD RIGHT HAND Performed at [facility]  [address] [place], [place] [address]    Special Requests   Final    IN PEDIATRIC BOTTLE Blood Culture results may not be optimal due to an excessive volume of blood received in culture bottles Performed at [place] [address]  [place], [place] [address]    Culture   Final    NO GROWTH 3 DAYS Performed at [facility], [place] [address], [place], [place] [address]    Report Status PENDING  Incomplete  Culture, body fluid w Gram Stain-bottle     Status: None (Preliminary result)   Collection Time: 12/01/21  9:41 PM   Specimen: Peritoneal Washings  Result Value Ref Range Status   Specimen Description PERITONEAL  Final   Special Requests NONE  Final   Culture   Final    NO GROWTH 3 DAYS Performed at [facility], [address] [place], [place] [address]    Report Status PENDING  Incomplete  Gram stain     Status: None   Collection Time: 12/01/21  9:41 PM   Specimen: Peritoneal Washings  Result Value Ref Range Status   Specimen Description PERITONEAL  Final   Special Requests NONE  Final   Gram Stain   Final    NO WBC SEEN NO ORGANISMS SEEN Performed at [facility], [address], [place],  [address]    Report Status 12/01/2021 FINAL  Final    Radiology Studies: US RENAL  Result Date: 12/02/2021 CLINICAL DATA:  Acute renal insufficiency. EXAM: RENAL / URINARY TRACT ULTRASOUND COMPLETE COMPARISON:  Right upper quadrant ultrasound of 12/01/2021. CT 12/01/2021. FINDINGS: Right Kidney: Renal measurements: 7.2 x 4.6 x 3.4 cm = volume: 67 mL. Mildly increased renal echogenicity. Mild renal cortical thinning. No hydronephrosis. Left Kidney: Renal measurements: 7.0 x 4.2 x 5.6 cm = volume: 86 mL. Mildly increased renal echogenicity. Normal cortical thickness. No hydronephrosis. 2.5 cm lesion within the lower pole was characterized on the MRI of 06/29/2021 as a complex cyst. Bladder: Appears normal for degree of bladder distention. Other: Abdominal ascites and cirrhosis. IMPRESSION: 1. No hydronephrosis. 2. Increased renal echogenicity, suggesting medical renal disease. 3. Cirrhosis and ascites. Electronically Signed   By: Abigail Miyamoto M.D.   On: 12/02/2021 17:49   DG CHEST PORT 1 VIEW  Result  Date: 12/04/2021 CLINICAL DATA:  Bilateral lower extremity edema.  Hypertension. EXAM: PORTABLE CHEST 1 VIEW COMPARISON:  12/01/2021 FINDINGS: Heart size remains within normal limits. New symmetric airspace disease is seen with bibasilar predominance, most likely due to pulmonary edema. Small layering bilateral pleural effusions are also suspected. Lumbar spine fusion hardware also noted. IMPRESSION: New symmetric airspace disease, most likely due to pulmonary edema. Suspect small layering bilateral pleural effusions. Electronically Signed   By: Marlaine Hind M.D.   On: 12/04/2021 08:23    Scheduled Meds:  amLODipine  2.5 mg Oral Daily   vitamin C  500 mg Oral Daily   docusate sodium  100 mg Oral BID   folic acid  1 mg Oral Daily   heparin  5,000 Units Subcutaneous Q8H   insulin aspart  0-15 Units Subcutaneous TID WC   insulin aspart  0-5 Units Subcutaneous QHS   levothyroxine  25 mcg Oral Daily   melatonin  3 mg Oral Once   metoprolol succinate  50 mg Oral Daily   molnupiravir EUA  4 capsule Oral BID   multivitamin with minerals  1 tablet Oral Daily   pantoprazole  40 mg Oral BID   thiamine  100 mg Oral Daily   zinc sulfate  220 mg Oral Daily   Continuous Infusions:   sodium bicarbonate (isotonic) infusion in sterile water 65 mL/hr at 12/04/21 1040    LOS: 3 days   Kerney Elbe, DO [facility] Available via Epic secure chat 7am-7pm After these hours, please refer to coverage provider listed on [email or web address] 12/04/2021, 4:48 PM

## 2021-12-05 ENCOUNTER — Inpatient Hospital Stay (HOSPITAL_COMMUNITY): Payer: Medicare Other

## 2021-12-05 DIAGNOSIS — R601 Generalized edema: Secondary | ICD-10-CM | POA: Diagnosis not present

## 2021-12-05 DIAGNOSIS — N179 Acute kidney failure, unspecified: Secondary | ICD-10-CM | POA: Diagnosis not present

## 2021-12-05 DIAGNOSIS — K7031 Alcoholic cirrhosis of liver with ascites: Secondary | ICD-10-CM | POA: Diagnosis not present

## 2021-12-05 DIAGNOSIS — K7030 Alcoholic cirrhosis of liver without ascites: Secondary | ICD-10-CM | POA: Diagnosis not present

## 2021-12-05 DIAGNOSIS — R0609 Other forms of dyspnea: Secondary | ICD-10-CM | POA: Diagnosis not present

## 2021-12-05 DIAGNOSIS — E118 Type 2 diabetes mellitus with unspecified complications: Secondary | ICD-10-CM | POA: Diagnosis not present

## 2021-12-05 LAB — GLUCOSE, CAPILLARY
Glucose-Capillary: 100 mg/dL — ABNORMAL HIGH (ref 70–99)
Glucose-Capillary: 106 mg/dL — ABNORMAL HIGH (ref 70–99)
Glucose-Capillary: 131 mg/dL — ABNORMAL HIGH (ref 70–99)
Glucose-Capillary: 118 mg/dL — ABNORMAL HIGH (ref 70–99)

## 2021-12-05 LAB — CBC WITH DIFFERENTIAL/PLATELET
Abs Immature Granulocytes: 0.02 10*3/uL (ref 0.00–0.07)
Basophils Absolute: 0 10*3/uL (ref 0.0–0.1)
Basophils Relative: 1 %
Eosinophils Absolute: 0.1 10*3/uL (ref 0.0–0.5)
Eosinophils Relative: 2 %
HCT: 24.8 % — ABNORMAL LOW (ref 36.0–46.0)
Hemoglobin: 7.9 g/dL — ABNORMAL LOW (ref 12.0–15.0)
Immature Granulocytes: 0 %
Lymphocytes Relative: 19 %
Lymphs Abs: 0.9 10*3/uL (ref 0.7–4.0)
MCH: 30.6 pg (ref 26.0–34.0)
MCHC: 31.9 g/dL (ref 30.0–36.0)
MCV: 96.1 fL (ref 80.0–100.0)
Monocytes Absolute: 0.5 10*3/uL (ref 0.1–1.0)
Monocytes Relative: 11 %
Neutro Abs: 3.1 10*3/uL (ref 1.7–7.7)
Neutrophils Relative %: 67 %
Platelets: 93 10*3/uL — ABNORMAL LOW (ref 150–400)
RBC: 2.58 MIL/uL — ABNORMAL LOW (ref 3.87–5.11)
RDW: 14.1 % (ref 11.5–15.5)
WBC: 4.7 10*3/uL (ref 4.0–10.5)
nRBC: 0 % (ref 0.0–0.2)

## 2021-12-05 LAB — COMPREHENSIVE METABOLIC PANEL
ALT: 21 U/L (ref 0–44)
AST: 34 U/L (ref 15–41)
Albumin: 2.3 g/dL — ABNORMAL LOW (ref 3.5–5.0)
Alkaline Phosphatase: 104 U/L (ref 38–126)
Anion gap: 6 (ref 5–15)
BUN: 59 mg/dL — ABNORMAL HIGH (ref 8–23)
CO2: 20 mmol/L — ABNORMAL LOW (ref 22–32)
Calcium: 7.7 mg/dL — ABNORMAL LOW (ref 8.9–10.3)
Chloride: 111 mmol/L (ref 98–111)
Creatinine, Ser: 3.28 mg/dL — ABNORMAL HIGH (ref 0.44–1.00)
GFR, Estimated: 15 mL/min — ABNORMAL LOW (ref >60)
Glucose, Bld: 99 mg/dL (ref 70–99)
Potassium: 4 mmol/L (ref 3.5–5.1)
Sodium: 137 mmol/L (ref 135–145)
Total Bilirubin: 0.9 mg/dL (ref 0.3–1.2)
Total Protein: 5.9 g/dL — ABNORMAL LOW (ref 6.5–8.1)

## 2021-12-05 LAB — AFP TUMOR MARKER: AFP, Serum, Tumor Marker: 3.8 ng/mL (ref 0.0–9.2)

## 2021-12-05 LAB — PHOSPHORUS: Phosphorus: 4.1 mg/dL (ref 2.5–4.6)

## 2021-12-05 LAB — D-DIMER, QUANTITATIVE: D-Dimer, Quant: 2.78 ug/mL-FEU — ABNORMAL HIGH (ref 0.00–0.50)

## 2021-12-05 LAB — ANTI-SMOOTH MUSCLE ANTIBODY, IGG: F-Actin IgG: 52 Units — ABNORMAL HIGH (ref 0–19)

## 2021-12-05 LAB — GLOMERULAR BASEMENT MEMBRANE ANTIBODIES: GBM Ab: <0.2 units (ref 0.0–0.9)

## 2021-12-05 LAB — MAGNESIUM: Magnesium: 2 mg/dL (ref 1.7–2.4)

## 2021-12-05 LAB — C-REACTIVE PROTEIN: CRP: 1.4 mg/dL — ABNORMAL HIGH (ref <1.0)

## 2021-12-05 LAB — FERRITIN: Ferritin: 32 ng/mL (ref 11–307)

## 2021-12-05 NOTE — Plan of Care (Signed)

## 2021-12-05 NOTE — Progress Notes (Addendum)
Daily Progress Note  Hospital Day: 5  Chief Complaint: cirrhosis  Brief History Stacey Chambers is a 71 y.o. female with a pmh not limited to DM, HTN, HLD, CKD IV,  hypothyroidism, vision loss, PUD, gastric intestinal metaplasia, diverticulosis, Etoh abuse. Admitted with dyspnea, weakness and swelling. CT scan concerning for cirrhosis ( new diagnosis)   Assessment   # Cirrhosis, decompensated with ascites. New diagnosis. Possibly Etoh related. Clinical course complicated with AKI on CKD Negative HBsAg, HCV ab, ANA, AMA and A-1 antitrypsin. No evidence for hemochromatosis. Ceruloplasmin low at 16.4. ASMA pending.  [place] screening. No focal lesion on unenhanced CT scan. No reported liver lesions on [place]. Normal AFP Ascites: No SBP on recent dx paracentesis. Only Grade II DD on echo Varices screening: No esophageal varices on EGD in March 2023.  # AKI on CKD. Cr 3.28, GFR 15. Nephrology following.  Could have HRS type 2 vs GN vs other  # Probable small stone in the region of the gallbladder neck   # COVID19, asymptomatic. Not requiring supplemental 02  # Hypertensive urgency    Plan   2 g sodium restricted diet  Diuretics when renal function allows Await ASMA Check HBs Ab to see if HBV vaccine needed.  Needs Eventual HAV vaccine series No etoh going forward  Subjective   Vomited this am but thinks it is due to having to take so many oral meds. Trying to space them out a little now. Otherwise no nausea. No abdominal pain  Objective   Imaging:  CT Abdomen Pelvis Wo Contrast  Result Date: 12/01/2021 CLINICAL DATA:  Abdominal pain, acute, nonlocalized EXAM: CT ABDOMEN AND PELVIS WITHOUT CONTRAST TECHNIQUE: Multidetector CT imaging of the abdomen and pelvis was performed following the standard protocol without IV contrast. RADIATION DOSE REDUCTION: This exam was performed according to the departmental dose-optimization program which includes automated exposure control,  adjustment of the mA and/or kV according to patient size and/or use of iterative reconstruction technique. COMPARISON:  MRI 06/29/2021 FINDINGS: Lower chest: Small layering bilateral pleural effusions with mild dependent bibasilar airspace opacities. Heart size within normal limits. Hepatobiliary: Nodular hepatic surface contour. No focal liver lesion identified on unenhanced imaging. Probable small stone in the region of the gallbladder neck. Gallbladder wall appears thickened. Pancreas: Unremarkable. No pancreatic ductal dilatation or surrounding inflammatory changes. Spleen: Mild splenomegaly.  No focal splenic lesion identified. Adrenals/Urinary Tract: Unremarkable adrenal glands. 2.9 cm slightly hyperdense lesion arising anteriorly from the lower pole of the left kidney previously seen to be a benign cyst on prior MRI. Kidneys otherwise within normal limits. No renal lesion or hydronephrosis. Urinary bladder is unremarkable. Stomach/Bowel: Stomach is within normal limits. Appendix appears normal. Left-sided colonic diverticulosis. No evidence of bowel wall thickening, distention, or inflammatory changes. Vascular/Lymphatic: Scattered aortoiliac atherosclerotic calcifications without aneurysm. No abdominopelvic lymphadenopathy. Reproductive: Uterus and bilateral adnexa are unremarkable. Other: Moderate volume ascites.  No pneumoperitoneum. Musculoskeletal: Anasarca. Prior T12-L5 posterior spinal fusion. Chronic appearing severe compression fracture of the L3 vertebral body. IMPRESSION: 1. Cirrhotic liver with evidence of portal hypertension including mild splenomegaly and moderate volume ascites. 2. Small layering bilateral pleural effusions with mild dependent bibasilar airspace opacities, which may represent atelectasis and/or pneumonia. 3. Probable small stone in the region of the gallbladder neck with associated gallbladder wall thickening. If there is clinical concern for acute cholecystitis, further  evaluation with right upper quadrant ultrasound is recommended. 4. Colonic diverticulosis without evidence of acute diverticulitis. 5. Chronic appearing severe compression  fracture of the L3 vertebral body. 6. Aortic Atherosclerosis (ICD10-I70.0). Electronically Signed   By: Davina Poke D.O.   On: 12/01/2021 13:40   US RENAL  Result Date: 12/02/2021 CLINICAL DATA:  Acute renal insufficiency. EXAM: RENAL / URINARY TRACT ULTRASOUND COMPLETE COMPARISON:  Right upper quadrant ultrasound of 12/01/2021. CT 12/01/2021. FINDINGS: Right Kidney: Renal measurements: 7.2 x 4.6 x 3.4 cm = volume: 67 mL. Mildly increased renal echogenicity. Mild renal cortical thinning. No hydronephrosis. Left Kidney: Renal measurements: 7.0 x 4.2 x 5.6 cm = volume: 86 mL. Mildly increased renal echogenicity. Normal cortical thickness. No hydronephrosis. 2.5 cm lesion within the lower pole was characterized on the MRI of 06/29/2021 as a complex cyst. Bladder: Appears normal for degree of bladder distention. Other: Abdominal ascites and cirrhosis. IMPRESSION: 1. No hydronephrosis. 2. Increased renal echogenicity, suggesting medical renal disease. 3. Cirrhosis and ascites. Electronically Signed   By: Abigail Miyamoto M.D.   On: 12/02/2021 17:49   US Paracentesis  Result Date: 12/01/2021 INDICATION: Patient with history of cirrhosis by imaging, portal hypertension, mild splenomegaly, ascites, pleural effusions, COVID-19 positive, worsening renal function; request received for diagnostic paracentesis. EXAM: ULTRASOUND GUIDED DIAGNOSTIC PARACENTESIS MEDICATIONS: 8 mL 1% lidocaine COMPLICATIONS: None immediate. PROCEDURE: Informed written consent was obtained from the patient via interpreter after a discussion of the risks, benefits and alternatives to treatment. A timeout was performed prior to the initiation of the procedure. Initial ultrasound scanning demonstrates a moderate amount of ascites within the right lower abdominal quadrant. The  right lower abdomen was prepped and draped in the usual sterile fashion. 1% lidocaine was used for local anesthesia. Following this, a 19 gauge, 10-cm, Yueh catheter was introduced. An ultrasound image was saved for documentation purposes. The paracentesis was performed. The catheter was removed and a dressing was applied. The patient tolerated the procedure well without immediate post procedural complication. FINDINGS: A total of approximately 180 cc of hazy, yellow fluid was removed. Samples were sent to the laboratory as requested by the clinical team. IMPRESSION: Successful ultrasound-guided diagnostic paracentesis yielding 180 cc of peritoneal fluid. Read by: Rowe Robert, PA-C Electronically Signed   By: Sandi Mariscal M.D.   On: 12/01/2021 16:41   DG CHEST PORT 1 VIEW  Result Date: 12/05/2021 CLINICAL DATA:  Hypertension and leg edema. EXAM: PORTABLE CHEST 1 VIEW COMPARISON:  December 04, 2021 FINDINGS: The heart size and mediastinal contours are within normal limits. Mild increased pulmonary interstitium is identified bilaterally. Minimal right pleural effusion is noted. Mild patchy consolidation of right lung base is noted. The visualized skeletal structures are stable. IMPRESSION: 1. Mild interstitial edema. 2. Mild patchy consolidation of right lung base, superimposed pneumonia is not excluded. Electronically Signed   By: Abelardo Diesel M.D.   On: 12/05/2021 07:26   DG CHEST PORT 1 VIEW  Result Date: 12/04/2021 CLINICAL DATA:  Bilateral lower extremity edema.  Hypertension. EXAM: PORTABLE CHEST 1 VIEW COMPARISON:  12/01/2021 FINDINGS: Heart size remains within normal limits. New symmetric airspace disease is seen with bibasilar predominance, most likely due to pulmonary edema. Small layering bilateral pleural effusions are also suspected. Lumbar spine fusion hardware also noted. IMPRESSION: New symmetric airspace disease, most likely due to pulmonary edema. Suspect small layering bilateral pleural effusions.  Electronically Signed   By: Marlaine Hind M.D.   On: 12/04/2021 08:23   DG Chest Port 1 View  Result Date: 12/01/2021 CLINICAL DATA:  Shortness of breath, edema both lower extremities EXAM: PORTABLE CHEST 1 VIEW COMPARISON:  None  Available. FINDINGS: Transverse diameter of heart is increased. Central pulmonary vessels are prominent. There are no signs of alveolar pulmonary edema or focal pulmonary consolidation. Small linear density in the left lower lung fields may suggest scarring or subsegmental atelectasis. There is no pleural effusion or pneumothorax. IMPRESSION: Central pulmonary vessels are prominent, possibly suggesting mild CHF. There is no focal pulmonary consolidation. Small transverse linear density in the left lower lung fields may suggest scarring or subsegmental atelectasis. Electronically Signed   By: Elmer Picker M.D.   On: 12/01/2021 12:33   ECHOCARDIOGRAM COMPLETE  Result Date: 12/02/2021    ECHOCARDIOGRAM REPORT   Patient Name:   Takiyah AMAVILIA Arrasmith Date of Exam: 12/02/2021 Medical Rec #:  [id number]             Height:       58.0 in Accession #:    [id number]            Weight:       143.3 lb Date of Birth:  [birth year]            BSA:          1.581 m Patient Age:    28 years              BP:           148/79 mmHg Patient Gender: F                     HR:           74 bpm. Exam Location:  Inpatient Procedure: 2D Echo, Cardiac Doppler and Color Doppler Indications:    Dyspnea  History:        Patient has no prior history of Echocardiogram examinations.                 Risk Factors:Diabetes and Hypertension.  Sonographer:    Joette Catching RCS Referring Phys: [id number] [facility] LATIF [facility]  Sonographer Comments: Technically challenging study due to limited acoustic windows. IMPRESSIONS  1. Left ventricular ejection fraction, by estimation, is 60 to 65%. The left ventricle has normal function. The left ventricle has no regional wall motion abnormalities. Left ventricular diastolic  parameters are consistent with Grade II diastolic dysfunction (pseudonormalization).  2. Right ventricular systolic function is normal. The right ventricular size is normal. There is mildly elevated pulmonary artery systolic pressure.  3. Left atrial size was mildly dilated.  4. The mitral valve is normal in structure. No evidence of mitral valve regurgitation.  5. There is mild calcification of the aortic valve. Aortic valve regurgitation is trivial.  6. The inferior vena cava is normal in size with greater than 50% respiratory variability, suggesting right atrial pressure of 3 mmHg. Comparison(s): No prior Echocardiogram. FINDINGS  Left Ventricle: Left ventricular ejection fraction, by estimation, is 60 to 65%. The left ventricle has normal function. The left ventricle has no regional wall motion abnormalities. The left ventricular internal cavity size was normal in size. There is  no left ventricular hypertrophy. Left ventricular diastolic parameters are consistent with Grade II diastolic dysfunction (pseudonormalization). Right Ventricle: The right ventricular size is normal. Right ventricular systolic function is normal. There is mildly elevated pulmonary artery systolic pressure. The tricuspid regurgitant velocity is 3.01 m/s, and with an assumed right atrial pressure of 3 mmHg, the estimated right ventricular systolic pressure is 50.9 mmHg. Left Atrium: Left atrial size was mildly dilated. Right Atrium: Right atrial size was normal in size. Pericardium: There  is no evidence of pericardial effusion. Mitral Valve: The mitral valve is normal in structure. No evidence of mitral valve regurgitation. Tricuspid Valve: The tricuspid valve is not well visualized. Tricuspid valve regurgitation is mild. Aortic Valve: There is mild calcification of the aortic valve. Aortic valve regurgitation is trivial. Aortic valve mean gradient measures 9.0 mmHg. Aortic valve peak gradient measures 17.8 mmHg. Aortic valve area, by VTI  measures 2.52 cm. Pulmonic Valve: The pulmonic valve was not well visualized. Pulmonic valve regurgitation is not visualized. Aorta: The aortic root and ascending aorta are structurally normal, with no evidence of dilitation. Venous: The inferior vena cava is normal in size with greater than 50% respiratory variability, suggesting right atrial pressure of 3 mmHg. IAS/Shunts: No atrial level shunt detected by color flow Doppler.  LEFT VENTRICLE PLAX 2D LVIDd:         4.70 cm   Diastology LVIDs:         3.10 cm   LV e' medial:    8.38 cm/s LV PW:         1.00 cm   LV E/e' medial:  13.6 LV IVS:        0.80 cm   LV e' lateral:   6.85 cm/s LVOT diam:     1.90 cm   LV E/e' lateral: 16.6 LV SV:         105 LV SV Index:   67 LVOT Area:     2.84 cm  RIGHT VENTRICLE             IVC RV Basal diam:  3.10 cm     IVC diam: 1.90 cm RV Mid diam:    1.90 cm RV S prime:     14.50 cm/s TAPSE (M-mode): 2.5 cm LEFT ATRIUM             Index        RIGHT ATRIUM           Index LA diam:        3.50 cm 2.21 cm/m   RA Area:     11.10 cm LA Vol (A2C):   66.0 ml 41.76 ml/m  RA Volume:   19.90 ml  12.59 ml/m LA Vol (A4C):   53.2 ml 33.66 ml/m LA Biplane Vol: 59.4 ml 37.58 ml/m  AORTIC VALVE                     PULMONIC VALVE AV Area (Vmax):    2.08 cm      PV Vmax:       1.21 m/s AV Area (Vmean):   2.09 cm      PV Peak grad:  5.9 mmHg AV Area (VTI):     2.52 cm AV Vmax:           211.00 cm/s AV Vmean:          145.000 cm/s AV VTI:            0.418 m AV Peak Grad:      17.8 mmHg AV Mean Grad:      9.0 mmHg LVOT Vmax:         155.00 cm/s LVOT Vmean:        107.000 cm/s LVOT VTI:          0.371 m LVOT/AV VTI ratio: 0.89  AORTA Ao Root diam: 2.90 cm MITRAL VALVE                TRICUSPID VALVE  MV Area (PHT): 4.49 cm     TR Peak grad:   36.2 mmHg MV Decel Time: 169 msec     TR Vmax:        301.00 cm/s MR Peak grad: 81.0 mmHg MR Vmax:      450.00 cm/s   SHUNTS MV E velocity: 114.00 cm/s  Systemic VTI:  0.37 m MV A velocity: 94.30 cm/s    Systemic Diam: 1.90 cm MV E/A ratio:  1.21 Phineas Inches Electronically signed by Phineas Inches Signature Date/Time: 12/02/2021/11:32:23 AM    Final    US Abdomen Limited RUQ (LIVER/GB)  Result Date: 12/01/2021 CLINICAL DATA:  Elevated LFTs. EXAM: ULTRASOUND ABDOMEN LIMITED RIGHT UPPER QUADRANT COMPARISON:  MRI of the abdomen without with contrast 06/29/2021. CT abdomen pelvis without contrast 12/01/2021 FINDINGS: Gallbladder: Gallbladder is contracted. Wall measures 5.3 mm. Pericholecystic fluid is present. Sludge is noted in the gallbladder. Common bile duct: Diameter: 2.2 mm, within normal limits. Liver: The liver is somewhat hyperechoic and heterogeneous. Nodular edge noted. Portal vein is patent on color Doppler imaging with normal direction of blood flow towards the liver. Other: Moderate abdominal ascites is present. Moderate right-sided pleural effusion is also noted. IMPRESSION: 1. Cirrhotic appearance of the liver. 2. Dominant ascites. 3. Gallbladder wall thickening may be artifactual given the gallbladder is shrunken and abdominal ascites are present. Electronically Signed   By: San Morelle M.D.   On: 12/01/2021 14:42    Lab Results: Recent Labs    12/03/21 0553 12/04/21 0541 12/05/21 0501  WBC 7.0 5.6 4.7  HGB 8.4* 8.0* 7.9*  HCT 26.4* 25.3* 24.8*  PLT 113* 103* 93*   BMET Recent Labs    12/03/21 0553 12/04/21 0541 12/05/21 0501  NA 134* 136 137  K 3.9 4.1 4.0  CL 108 112* 111  CO2 17* 17* 20*  GLUCOSE 99 105* 99  BUN 61* 66* 59*  CREATININE 3.39* 3.46* 3.28*  CALCIUM 7.9* 7.7* 7.7*   LFT Recent Labs    12/05/21 0501  PROT 5.9*  ALBUMIN 2.3*  AST 34  ALT 21  ALKPHOS 104  BILITOT 0.9   Component     Latest Ref Rng 12/02/2021 12/03/2021  Hepatitis B Surface Ag     NON REACTIVE  NON REACTIVE    HCV Ab     NON REACTIVE  NON REACTIVE    Hep A Ab, IgM     NON REACTIVE  NON REACTIVE    Hep B Core Ab, IgM     NON REACTIVE  NON REACTIVE    Iron     28 - 170  ug/dL  21 (L)   TIBC     250 - 450 ug/dL  343   Saturation Ratios     10.4 - 31.8 %  6 (L)   UIBC     ug/dL  322   Anti Nuclear Antibody (ANA)     Negative  Negative    Mitochondrial M2 Ab, IgG     0.0 - 20.0 Units <20.0    Ceruloplasmin     19.0 - 39.0 mg/dL 16.4 (L)    A-1 Antitrypsin, Ser     101 - 187 mg/dL 181    AFP, Serum, Tumor Marker     0.0 - 9.2 ng/mL  3.8   Vitamin B12     180 - 914 pg/mL  931 (H)   Folate     >5.9 ng/mL  28.6   Ferritin     11 -  307 ng/mL         PT/INR No results for input(s): LABPROT, INR in the last 72 hours.   Scheduled inpatient medications:   amLODipine  2.5 mg Oral Daily   vitamin C  500 mg Oral Daily   docusate sodium  100 mg Oral BID   folic acid  1 mg Oral Daily   heparin  5,000 Units Subcutaneous Q8H   insulin aspart  0-15 Units Subcutaneous TID WC   insulin aspart  0-5 Units Subcutaneous QHS   levothyroxine  25 mcg Oral Daily   melatonin  3 mg Oral Once   metoprolol succinate  50 mg Oral Daily   molnupiravir EUA  4 capsule Oral BID   multivitamin with minerals  1 tablet Oral Daily   pantoprazole  40 mg Oral BID   thiamine  100 mg Oral Daily   zinc sulfate  220 mg Oral Daily   Continuous inpatient infusions:    sodium bicarbonate (isotonic) infusion in sterile water 40 mL/hr at 12/05/21 1021   PRN inpatient medications: acetaminophen **OR** acetaminophen, chlorpheniramine-HYDROcodone, fluticasone, guaiFENesin-dextromethorphan, hydrALAZINE, Ipratropium-Albuterol, melatonin, ondansetron **OR** ondansetron (ZOFRAN) IV, oxyCODONE, polyethylene glycol  Vital signs in last 24 hours: Temp:  [98.5 F (36.9 C)] 98.5 F (36.9 C) (06/05 0534) Pulse Rate:  [71-77] 71 (06/05 0534) Resp:  [18] 18 (06/05 0534) BP: (143-174)/(72-89) 157/89 (06/05 0534) SpO2:  [100 %] 100 % (06/05 0534) Weight:  [69.4 kg] 69.4 kg (06/05 0500) Last BM Date : 12/04/21  Intake/Output Summary (Last 24 hours) at 12/05/2021 1154 Last data filed at  12/05/2021 0700 Gross per 24 hour  Intake 399.82 ml  Output 1250 ml  Net -850.18 ml     Physical Exam:  General: Alert female in NAD Heart:  Regular rate and rhythm. No lower extremity edema Pulmonary: Normal respiratory effort Abdomen: Soft, not significantly distended, there is abdominal wall edema. Ecchymosis across lower abdomen. Normal bowel sounds.  Neurologic: Alert and oriented Psych: Pleasant. Cooperative.    Intake/Output from previous day: 06/04 0701 - 06/05 0700 In: 639.8 [P.O.:360; I.V.:279.8] Out: 1425 [Urine:1425] Intake/Output this shift: No intake/output data recorded.    Principal Problem:   Cirrhosis ([place]) Active Problems:   History of gastric ulcer   History of thrombocytopenia   History of iron deficiency anemia   Gastric ulcer   Dyspnea on exertion   Swelling of lower extremity   Acute kidney injury superimposed on CKD ([place])   Diabetes mellitus type 2 with complications ([place])     LOS: 4 days   Stacey Chambers ,NP 12/05/2021, 11:54 AM    I have taken an interval history, thoroughly reviewed the chart and examined the patient. I agree with the Advanced Practitioner's note, impression and recommendations, and have recorded additional findings, impressions and recommendations below. I performed a substantive portion of this encounter (>50% time spent), including a complete performance of the medical decision making.  My additional thoughts are as follows:  Signout received from Dr. Bryan Lemma, patient seen and examined and case discussed with nurse practitioner.  Extensive chart review performed on this medically complex patient.  New diagnosis of cirrhosis, serologic work-up partially resulted, some still pending.  Low ceruloplasmin of unclear significance, though should be investigated further since it is not from cholestasis (patient's LFTs normal). We will obtain serum copper level, and a 24-hour urine for copper should wait until the outpatient  setting when patient's renal function and urine output hopefully improved.  Ascites with anasarca marked hypoalbuminemia.  Nephrology is following this patient to follow renal function and manage volume status.  Elevated BN P with echocardiogram appeared to show normal left and right-sided pressures (given the reported technical challenges of limited acoustic windows).  Nothing else new to add from GI/liver standpoint at this point.  We will see the patient the day after tomorrow unless called sooner if needed.  Nelida Meuse III [phone]

## 2021-12-05 NOTE — Progress Notes (Signed)
PROGRESS NOTE    Stacey Chambers  DQQ:[id number] DOB: [birth year] DOA: 12/01/2021 PCP: Trey Sailors, PA   Brief Narrative:  Stacey Chambers is a 71 y.o. female with medical history significant of for but not limited to chronic iron deficiency anemia, diabetes mellitus type 2, hypertension, hyperlipidemia, hypothyroidism, history of CKD stage IIIb as well as other comorbidities who presents with worsening lower extremity swelling and elevated blood pressure.  Patient states that she started feeling fatigued and tired since Friday of last week is noticed that her legs started having worsening swelling.  She states that whenever she ambulates she gets very dyspneic on exertion and very fatigued.  States that she denies any abdominal pain and denies any cough, chest pain or lightheadedness or dizziness.  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  She had lower extremity swelling and given her dyspnea on exertion we will obtain an echocardiogram and evaluate for heart failure.  She is also noted to have AKI on CKD stage IIIb.  Incidentally she was found to be COVID-positive.  Gastroenterology was consulted and [facility] was asked to admit this patient for lower extremity swelling.  Interventional radiology was consulted for a paracentesis and this has been done and she underwent a diagnostic tap.   **Interim History  Her edema has improved however renal function is worsened.  She is not complaining of any chest pain or shortness of breath.  Abdomen is doing okay today.  She has been consulted and doing further work-up and nephrology is also been consulted given her worsening renal function   Nephrology has placed the patient on low-dose IV fluids at 65 MLS per hour but now have reduced it to 40 mL/hour now that creatinine is leveling off.   Chest x-ray done today and "New symmetric airspace disease, most likely due to pulmonary edema. Suspect small layering bilateral pleural effusions" to keep a close eye on this.   Assessment and Plan:  New onset Decompensated liver cirrhosis with portal hypertension and associated mild splenomegaly and moderate ascites Mildly elevated AST -Unclear etiology of her liver cirrhosis so we have consulted GI for further evaluation recommendations -Check acute hepatitis panel and Negative  -Obtaining diagnostic paracentesis for moderate volume ascites and may need a therapeutic thoracentesis; diagnostic paracentesis yielded 180 mils of hazy fluid and this was sent off for fluid analysis and not consistent with SBP -Given IV Lasix 20 mg x 2 and now on gentle IVF with NS at 65 mL/hr and it was reduced to 40 mL/hr and changed to Sodium Bicarbonate gtt -AST was 36 -> 40 -> 34 -Ammonia Level was 20 and PT-INR was 17.4/1.4 respectively  -We will defer diuretics to further GI evaluation recommendations -She has had alcohol in the past but denies any currently; has been placed on Solu-Medrol half a milligram per kilogram every 12 for 3 days given her COVID-positive and will stop -Check echocardiogram and showed EF of 60-65% and G2DD -GIs been consulted for further evaluation recommendations and recommending starting 2 g sodium diet and check an AFP as well as checking an ANA, mitochondrial antibody, smooth muscle antibody, ceruloplasmin and alpha-1 antitrypsin; ASMA is pending -Does not need an EGD as this was recently done as outlined but she had no varices -GI feels that she would benefit from low-dose diuretics however they are hesitant to initiate given her worsening  renal status and significant CKD -She does have a history of alcoholism and use in the past years but GI feels that 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 but they are ruling out other etiologies for her cirrhosis with ascites; her  alpha-1 antitrypsin level is within normal limits and rest of the autoimmune work-up is still pending -Ceruloplasmin was 16.4 and on the lower side -Further Care per GI and she has had no evidence for hemochromatosis and her [place] screening showed a nonfocal lesion on unenhanced CT scan with no reported liver lesion ultrasound and normal AFP -She had no SBP on recent paracentesis and has grade 2 diastolic dysfunction on echo and she had no esophageal varices back in March 2023 -GI recommending 2 g sodium restricted diet and diuretics when renal function allows   Dyspnea on Exertion r/o CHF (Unclear type), improving -Noted to have an elevated BNP on admission 715 -Placed on Combivent 1 puff IH every 6 scheduled -Check ECHOCardiogram and showed G2DD and EF was 60-65% -Received IV Lasix 20 mg x2 and now on IVF -D-Dimer Elevated but likely in the setting of COVID and is now trending upwards -May consider V/Q Scan given D-Dimer being elevated -D/G Chest X-ray showed "Mild interstitial edema. Mild patchy consolidation of right lung base, superimposed pneumonia is not excluded." -SpO2: 99 % -We will consider getting a VQ scan if continues to worsen -Repeat CXR in the AM   COVID-19 Disease -Currently asymptomatic and chest x-ray and chest CT scan done -Check inflammatory markers and D-dimer was elevated at 5.88 and fibrinogen was 371 -CXR as Above -Check Blood Cx x2 and Procalcitonin -Inflammatory Marker Trend:  Recent Labs    12/03/21 0553 12/04/21 0541 12/05/21 0501  DDIMER 7.40* 4.45* 2.78*  FERRITIN 28 31 32  CRP 0.7 0.7 1.4*  -CRP is now elevated mildly to 1.4   Lab Results  Component Value Date   SARSCOV2NAA POSITIVE (A) 12/01/2021     -SpO2: 100 %; Not requiring any Supplemental O2 via [place]  -Airborne and Contract Precautions -Will place on Combivent 1 puff IH q6h -Started Molnupiravir 800 mg po BID x5 Days; -Antitussives with Robitussin DM and Tussionex  -Started Steroids  Solumedrol 0.5 mg/kg x12 hours for 3 days and then 50 mg po daily have now stopped since her CRP is not elevated -Start Zinc and Vitamin C   Hypertensive Urgency -BP was elevated on Admission -C/w Amlodipine 2.5 mg pi Daily and Metoprolol Succinate 50 mg po daily  -Added Hydralazine 10 mg q6hprn for SBP>180 or DBP>100 -Continue to Monitor BP per Protocol -Last BP reading was elevated  now and is 170/78   Hx of Gastric Ulcer/GERD/GI Prophylaxis -C/w PPI BID   Diabetes Mellitus Type 2 -Hold Home Glipizide -Check HbA1c and was 4.9 -Place on Moderate Novolog SSI AC/HS -Continue to Monitor and Trend Blood Sugars carefully  -CBGs ranging from 100-132   Hypothyroidism -Check TSH was 2.793 -C/w Levothyroxine 25 mcg po Daily   AKI on likely CKD stage IIIb with concern for nephrotic syndrome; Could have HepatoRenal Syndrome Type 2 vs GN vs. Other Metabolic Acidosis Hyperphosphatemia Hyponatremia  -Her baseline creatinine has been around 1.4-2.1 but has basically been CKD stage IIIb -Patient's BUNs/creatinine now is trending from 46/2.93 -> 55/3.21 -> 61/3.39 -> 59/3.28 -She had a slight metabolic acidosis with a CO2 of 17, anion gap of 7, chloride level 112 and now improved on bicarbonate drip but she has a CO2 of 20, anion gap  of 6, chloride level of 111 -Given 2 doses of IV Lasix 20 mg given her volume overload and will hold off further Diurectis given worsened Renal fxn and now nephrology has started the patient on IV fluid with normal saline at 65 mils per hour and changed to Sodium Bicarbonate and reduced the rate to 40 mL/hour -Per nephrology we will continue holding diuretics for now as she appears to be volume responsive at this time without features of significant volume overload -Check urine osmolality, urine sodium, urine creatinine, and renal ultrasound -Urine studies sent off and total urine protein was 455.73, urine protein creatinine ratio 6.86, urine osmolality was 380, urine  sodium was 48, urine creatinine 66.47 -Renal ultrasound done and showed "No hydronephrosis. Increased renal echogenicity, suggesting medical renal disease. Cirrhosis and ascites.";  Nephrology evaluated and they feel that her kidneys are small without hydronephrosis -Urine sediment was done and showed numerous dysmorphic RBCs and 1 RBC casts as well as rare granular casts and mild pyuria -Apart from her intra-abdominal ascites he is not volume overloaded and nephrology recommends continue to hold diuretics as she continues to make urine -We will need strict I's and O's and daily weights -Per nephrology she could have hepatorenal syndrome type II versus glomerulonephritis versus others they are to send off serologies -She is at high risk for ending up on dialysis but she has no uremic signs and does not need dialysis yet.  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 -Patient's Phos level is now 5.1 -> 4.3 -> 4.1 x2 -Nephrology recommended placing a Foley catheter but this was not done given patient tolerance and now we are obtaining strict I's and O's -Nephrology recommends that she may benefit at some point from a renal biopsy if renal function does not improve significantly that would most likely be prognostic based on her atrophy kidney seen  and assist with diagnosis   Normocytic Anemia -Patient has a history of iron deficiency and take iron supplementation but will hold this for now pending Anemia Panel -Patient's hemoglobin/hematocrit now 10.2/31.3 -> 8.5/25.8 -> 8.4/26.4 -> 8.0/25.3 -> 7.9/24.8 -Anemia panel done and showed an iron level of 21, U IBC of 322, TIBC of 343, saturation ratios of 6%, ferritin level 28, folate level of 28.6 and vitamin B12 931 -Continue to  monitor for signs and symptoms of bleeding; no overt bleeding noted -Repeat CBC in a.m.   Hypoalbuminemia -Patient's Albumin Level went from 2.5 -> 2.3x2 -Continue to Monitor and Trend -Repeat CMP in the AM    Thrombocytopenia -Patient's Platelet Count is now 109 -> 96 -> 113 -> 103 -> 93 and possibly in the setting of splenomegaly and new onset liver cirrhosis -Overt signs or symptoms of bleeding; repeat CBC in a.m.  DVT prophylaxis: heparin injection 5,000 Units Start: 12/01/21 2200 SCDs Start: 12/01/21 1826    Code Status: Full Code Family Communication: No family currently at bedside  Disposition Plan:  Level of care: Telemetry Status is: Inpatient Remains inpatient appropriate because: Needs further GI and renal clearance   Consultants:  Nephrology Gastroenterology   Procedures:  Renal U/S  Antimicrobials:  Anti-infectives (From admission, onward)    Start     Dose/Rate Route Frequency Ordered Stop   12/01/21 1900  molnupiravir EUA (LAGEVRIO) capsule 800 mg  4 capsule Oral 2 times daily 12/01/21 1546 12/06/21 2159       Subjective: Seen and examined at bedside with the assistance of the video translator straight catheter [phone] the patient is doing fairly well.  Denies any chest pain or shortness of breath.  Denies any swelling currently.  Happy that her kidney function improved slightly.  Feels okay.  States that she has some bruising on her arms from the blood draws.  No other concerns or complaints at this time.  Objective: Vitals:   12/04/21 2140 12/05/21 0500 12/05/21 0534 12/05/21 1217  BP: (!) 174/87  (!) 157/89 (!) 170/78  Pulse: 75  71 73  Resp: 18  18 19   Temp: 98.5 F (36.9 C)  98.5 F (36.9 C) 98.4 F (36.9 C)  TempSrc: Oral  Oral Oral  SpO2: 100%  100% 99%  Weight:  69.4 kg    Height:        Intake/Output Summary (Last 24 hours) at 12/05/2021 1312 Last data filed at 12/05/2021 0700 Gross per 24 hour  Intake 399.82 ml  Output 1250 ml   Net -850.18 ml   Filed Weights   12/03/21 0500 12/04/21 0500 12/05/21 0500  Weight: 64 kg 67.5 kg 69.4 kg   Examination: Physical Exam:  Constitutional: WN/WD Hispanic female currently in no acute distress appears calm Respiratory: Diminished to auscultation bilaterally, no wheezing, rales, rhonchi or crackles. Normal respiratory effort and patient is not tachypenic. No accessory muscle use.  Unlabored breathing Cardiovascular: RRR, no murmurs / rubs / gallops. S1 and S2 auscultated. No extremity edema. Abdomen: Soft, non-tender, distended secondary body habitus. Bowel sounds positive.  GU: Deferred. Musculoskeletal: No clubbing / cyanosis of digits/nails. No joint deformity upper and lower extremities.  Skin: Has some bruising on the upper arms from her blood draws Neurologic: CN 2-12 grossly intact with no focal deficits. Romberg sign and cerebellar reflexes not assessed.  Psychiatric: Normal judgment and insight. Alert and oriented x 3. Normal mood and appropriate affect.   Data Reviewed: I have personally reviewed following labs and imaging studies  CBC: Recent Labs  Lab 12/01/21 1212 12/02/21 0458 12/03/21 0553 12/04/21 0541 12/05/21 0501  WBC 4.3 3.5* 7.0 5.6 4.7  NEUTROABS 3.3 2.9 5.1 4.1 3.1  HGB 10.2* 8.5* 8.4* 8.0* 7.9*  HCT 31.3* 25.8* 26.4* 25.3* 24.8*  MCV 95.4 94.9 97.1 96.9 96.1  PLT 109* 96* 113* 103* 93*   Basic Metabolic Panel: Recent Labs  Lab 12/01/21 1212 12/02/21 0458 12/03/21 0553 12/04/21 0541 12/05/21 0501  NA 136 135 134* 136 137  K 4.1 4.0 3.9 4.1 4.0  CL 109 111 108 112* 111  CO2 18* 18* 17* 17* 20*  GLUCOSE 125* 140* 99 105* 99  BUN 46* 55* 61* 66* 59*  CREATININE 2.93* 3.21* 3.39* 3.46* 3.28*  CALCIUM 8.5* 8.2* 7.9* 7.7* 7.7*  MG  --  2.1 2.2 2.0 2.0  PHOS  --  5.1* 4.3 4.1 4.1   GFR: Estimated Creatinine Clearance: 13.6 mL/min (A) (by C-G formula based on SCr of 3.28 mg/dL (H)). Liver Function Tests: Recent Labs  Lab  12/01/21 1212 12/02/21 0458 12/03/21 0553 12/04/21 0541 12/05/21 0501  AST 43* 40 36 40 34  ALT 21 21 20 24 21   ALKPHOS 142* 111 107 97 104  BILITOT 1.0 0.7 0.6 0.6 0.9  PROT 7.3 6.3* 6.5 5.9* 5.9*  ALBUMIN 2.8* 2.3* 2.5* 2.3* 2.3*   Recent Labs  Lab 12/01/21 1212  LIPASE 94*  Recent Labs  Lab 12/01/21 1652  AMMONIA 20   Coagulation Profile: Recent Labs  Lab 12/01/21 1435  INR 1.4*   Cardiac Enzymes: No results for input(s): CKTOTAL, CKMB, CKMBINDEX, TROPONINI in the last 168 hours. BNP (last 3 results) No results for input(s): PROBNP in the last 8760 hours. HbA1C: No results for input(s): HGBA1C in the last 72 hours. CBG: Recent Labs  Lab 12/04/21 1143 12/04/21 1635 12/04/21 2137 12/05/21 0810 12/05/21 1213  GLUCAP 114* 132* 119* 100* 106*   Lipid Profile: No results for input(s): CHOL, HDL, LDLCALC, TRIG, CHOLHDL, LDLDIRECT in the last 72 hours. Thyroid Function Tests: No results for input(s): TSH, T4TOTAL, FREET4, T3FREE, THYROIDAB in the last 72 hours. Anemia Panel: Recent Labs    12/03/21 0553 12/04/21 0541 12/05/21 0501  VITAMINB12 931*  --   --   FOLATE 28.6  --   --   FERRITIN 28 31 32  TIBC 343  --   --   IRON 21*  --   --   RETICCTPCT 2.4  --   --    Sepsis Labs: Recent Labs  Lab 12/01/21 1704  PROCALCITON <0.10    Recent Results (from the past 240 hour(s))  SARS Coronavirus 2 by RT PCR (hospital order, performed in [facility] hospital lab) *cepheid single result test* Anterior Nasal Swab     Status: Abnormal   Collection Time: 12/01/21 12:12 PM   Specimen: Anterior Nasal Swab  Result Value Ref Range Status   SARS Coronavirus 2 by RT PCR POSITIVE (A) NEGATIVE Final    Comment: (NOTE) SARS-CoV-2 target nucleic acids are DETECTED  SARS-CoV-2 RNA is generally detectable in upper respiratory specimens  during the acute phase of infection.  Positive results are indicative  of the presence of the identified virus, but do not rule  out bacterial infection or co-infection with other pathogens not detected by the test.  Clinical correlation with patient history and  other diagnostic information is necessary to determine patient infection status.  The expected result is negative.  Fact Sheet for Patients:   https://www.patel.info/   Fact Sheet for Healthcare Providers:   https://hall.com/    This test is not yet approved or cleared by the [place] FDA and  has been authorized for detection and/or diagnosis of SARS-CoV-2 by FDA under an Emergency Use Authorization (EUA).  This EUA will remain in effect (meaning this test can be used) for the duration of  the COVID-19 declaration under Section 564(b)(1)  of the Act, 21 U.S.C. section 360-bbb-3(b)(1), unless the authorization is terminated or revoked sooner.   Performed at [facility], [place] [address], [place], [place] [address]   Fungus Culture With Stain     Status: None (Preliminary result)   Collection Time: 12/01/21  4:27 PM   Specimen: Abdomen; Peritoneal Fluid  Result Value Ref Range Status   Fungus Stain Final report  Final    Comment: (NOTE) Performed At: [facility] [address], [place] [id number] Rush Farmer MD BZ:[id number]    Fungus (Mycology) Culture PENDING  Incomplete   Fungal Source PERITONEAL  Final    Comment: Performed at [facility] : [facility], [place] [address] [place], [place] [address]  Fungus Culture Result     Status: None   Collection Time: 12/01/21  4:27 PM  Result Value Ref Range Status   Result 1 Comment  Final    Comment: (NOTE) KOH/Calcofluor preparation:  no fungus observed. Performed At: [place]  [place], [place] [id number] Rush Farmer MD PJ:[id number]   Culture, blood (Routine X 2) w Reflex to ID Panel     Status: None (Preliminary result)   Collection Time: 12/01/21  4:52 PM   Specimen: BLOOD   Result Value Ref Range Status   Specimen Description   Final    BLOOD SITE NOT SPECIFIED Performed at [facility] [address], [place], [place] [address]    Special Requests   Final    BOTTLES DRAWN AEROBIC AND ANAEROBIC Blood Culture results may not be optimal due to an inadequate volume of blood received in culture bottles Performed at [place] [address] [place], [place] [address]    Culture   Final    NO GROWTH 4 DAYS Performed at [facility], [place] [address] [place], [place] [address]    Report Status PENDING  Incomplete  Culture, blood (Routine X 2) w Reflex to ID Panel     Status: None (Preliminary result)   Collection Time: 12/01/21  8:10 PM   Specimen: BLOOD  Result Value Ref Range Status   Specimen Description   Final    BLOOD BLOOD RIGHT HAND Performed at [facility] [address] [place], [place] [address]    Special Requests   Final    IN PEDIATRIC BOTTLE Blood Culture results may not be optimal due to an excessive volume of blood received in culture bottles Performed at [facility] [address] [place], [place] [address]    Culture   Final    NO GROWTH 4 DAYS Performed at [facility], [place] [address] [place], [place] [address]    Report Status PENDING  Incomplete  Culture, body fluid w Gram Stain-bottle     Status: None (Preliminary result)   Collection Time: 12/01/21  9:41 PM   Specimen: Peritoneal Washings  Result Value Ref Range Status   Specimen Description PERITONEAL  Final   Special Requests NONE  Final   Culture   Final    NO GROWTH 4 DAYS Performed at [facility], [address] [place], [place] [address]    Report Status PENDING  Incomplete  Gram stain     Status: None   Collection Time: 12/01/21  9:41 PM   Specimen: Peritoneal Washings  Result Value Ref Range Status   Specimen Description PERITONEAL  Final   Special Requests  NONE  Final   Gram Stain   Final    NO WBC SEEN NO ORGANISMS SEEN Performed at [facility], [address], [place],  [address]    Report Status 12/01/2021 FINAL  Final    Radiology Studies: DG CHEST PORT 1 VIEW  Result Date: 12/05/2021 CLINICAL DATA:  Hypertension and leg edema. EXAM: PORTABLE CHEST 1 VIEW COMPARISON:  December 04, 2021 FINDINGS: The heart size and mediastinal contours are within normal limits. Mild increased pulmonary interstitium is identified bilaterally. Minimal right pleural effusion is noted. Mild patchy consolidation of right lung base is noted. The visualized skeletal structures are stable. IMPRESSION: 1. Mild interstitial edema. 2. Mild patchy consolidation of right lung base, superimposed pneumonia is not excluded. Electronically Signed   By: Abelardo Diesel M.D.   On: 12/05/2021 07:26   DG CHEST PORT 1 VIEW  Result Date: 12/04/2021 CLINICAL DATA:  Bilateral lower extremity edema.  Hypertension. EXAM: PORTABLE CHEST 1 VIEW COMPARISON:  12/01/2021 FINDINGS: Heart size remains within  normal limits. New symmetric airspace disease is seen with bibasilar predominance, most likely due to pulmonary edema. Small layering bilateral pleural effusions are also suspected. Lumbar spine fusion hardware also noted. IMPRESSION: New symmetric airspace disease, most likely due to pulmonary edema. Suspect small layering bilateral pleural effusions. Electronically Signed   By: Marlaine Hind M.D.   On: 12/04/2021 08:23    Scheduled Meds:  amLODipine  2.5 mg Oral Daily   vitamin C  500 mg Oral Daily   docusate sodium  100 mg Oral BID   folic acid  1 mg Oral Daily   heparin  5,000 Units Subcutaneous Q8H   insulin aspart  0-15 Units Subcutaneous TID WC   insulin aspart  0-5 Units Subcutaneous QHS   levothyroxine  25 mcg Oral Daily   melatonin  3 mg Oral Once   metoprolol succinate  50 mg Oral Daily   molnupiravir EUA  4 capsule Oral BID   multivitamin with minerals  1 tablet Oral  Daily   pantoprazole  40 mg Oral BID   thiamine  100 mg Oral Daily   zinc sulfate  220 mg Oral Daily   Continuous Infusions:   sodium bicarbonate (isotonic) infusion in sterile water 40 mL/hr at 12/05/21 1021    LOS: 4 days   Raiford Noble, DO [facility] Available via Epic secure chat 7am-7pm After these hours, please refer to coverage provider listed on [email or web address] 12/05/2021, 1:12 PM

## 2021-12-05 NOTE — Progress Notes (Signed)
Patient ID: Stacey Chambers, female   DOB: [birth year], 71 y.o.   MRN: [id number] [facility] Progress Note   Assessment/ Plan:   1. Acute kidney Injury on CKD Stage IV: With baseline creatinine of 2.1 after gradual progression over the past year. Overnight was non-oliguric with some improvement of renal function and without acute indications for dialysis. Holding diuretics for now as she appears to be volume responsive at this time and without features of overload- unclear if this is HRS2 or possible GN (based on urine sediment). She may benefit at some point from a renal biopsy if renal function does not significantly improve--- that would be mostly prognostic (based on atrophic kidneys seen on ultrasound) and assist with diagnosis.   2. 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 Holding diuretics at this time.  3. Hypertensive urgency: blood pressures remain intermittently elevated on low dose amlodipine, metoprolol and PRN hydralazine.  4. COVID 19 disease: without significant hypoxia, on Molnupiravir, Vitamin C and Zinc.   Subjective:   Reports some nausea this morning after taking "many pills at once"   Objective:   BP (!) 170/78 (BP Location: Left Arm)   Pulse 73   Temp 98.4 F (36.9 C) (Oral)   Resp 19   Ht 4\' 10"  (1.473 m)   Wt 69.4 kg   SpO2 99%   BMI 31.98 kg/m   Intake/Output Summary (Last 24 hours) at 12/05/2021 1217 Last data filed at 12/05/2021 0700 Gross per 24 hour  Intake 399.82 ml  Output 1250 ml  Net -850.18 ml   Weight change: 1.9 kg  Physical Exam: HFW:YOVZCHYIFOY resting in bed DXA:JOINO regular rhythm and normal rate, normal s1 and s2 Resp:Decrease breath sounds over bases, no rales/rhonchi MVE:HMCN, slightly distended, non-tender, bowel sounds normal Ext:No LE edema  Imaging: DG CHEST PORT 1 VIEW  Result Date:  12/05/2021 CLINICAL DATA:  Hypertension and leg edema. EXAM: PORTABLE CHEST 1 VIEW COMPARISON:  December 04, 2021 FINDINGS: The heart size and mediastinal contours are within normal limits. Mild increased pulmonary interstitium is identified bilaterally. Minimal right pleural effusion is noted. Mild patchy consolidation of right lung base is noted. The visualized skeletal structures are stable. IMPRESSION: 1. Mild interstitial edema. 2. Mild patchy consolidation of right lung base, superimposed pneumonia is not excluded. Electronically Signed   By: Abelardo Diesel M.D.   On: 12/05/2021 07:26   DG CHEST PORT 1 VIEW  Result Date: 12/04/2021 CLINICAL DATA:  Bilateral lower extremity edema.  Hypertension. EXAM: PORTABLE CHEST 1 VIEW COMPARISON:  12/01/2021 FINDINGS: Heart size remains within normal limits. New symmetric airspace disease is seen with bibasilar predominance, most likely due to pulmonary edema. Small layering bilateral pleural effusions are also suspected. Lumbar spine fusion hardware also noted. IMPRESSION: New symmetric airspace disease, most likely due to pulmonary edema. Suspect small layering bilateral pleural effusions. Electronically Signed   By: Marlaine Hind M.D.   On: 12/04/2021 08:23    Labs: BMET Recent Labs  Lab 12/01/21 1212 12/02/21 0458 12/03/21 0553 12/04/21 0541 12/05/21 0501  NA 136 135 134* 136 137  K 4.1 4.0 3.9 4.1 4.0  CL 109 111 108 112* 111  CO2 18* 18* 17* 17* 20*  GLUCOSE 125* 140* 99 105* 99  BUN 46* 55* 61* 66* 59*  CREATININE 2.93* 3.21* 3.39* 3.46* 3.28*  CALCIUM 8.5* 8.2* 7.9* 7.7* 7.7*  PHOS  --  5.1* 4.3 4.1 4.1  CBC Recent Labs  Lab 12/02/21 0458 12/03/21 0553 12/04/21 0541 12/05/21 0501  WBC 3.5* 7.0 5.6 4.7  NEUTROABS 2.9 5.1 4.1 3.1  HGB 8.5* 8.4* 8.0* 7.9*  HCT 25.8* 26.4* 25.3* 24.8*  MCV 94.9 97.1 96.9 96.1  PLT 96* 113* 103* 93*   Medications:     amLODipine  2.5 mg Oral Daily   vitamin C  500 mg Oral Daily   docusate sodium  100  mg Oral BID   folic acid  1 mg Oral Daily   heparin  5,000 Units Subcutaneous Q8H   insulin aspart  0-15 Units Subcutaneous TID WC   insulin aspart  0-5 Units Subcutaneous QHS   levothyroxine  25 mcg Oral Daily   melatonin  3 mg Oral Once   metoprolol succinate  50 mg Oral Daily   molnupiravir EUA  4 capsule Oral BID   multivitamin with minerals  1 tablet Oral Daily   pantoprazole  40 mg Oral BID   thiamine  100 mg Oral Daily   zinc sulfate  220 mg Oral Daily   [name], MD 12/05/2021, 12:17 PM

## 2021-12-06 DIAGNOSIS — R0609 Other forms of dyspnea: Secondary | ICD-10-CM | POA: Diagnosis not present

## 2021-12-06 DIAGNOSIS — K7030 Alcoholic cirrhosis of liver without ascites: Secondary | ICD-10-CM | POA: Diagnosis not present

## 2021-12-06 DIAGNOSIS — N179 Acute kidney failure, unspecified: Secondary | ICD-10-CM | POA: Diagnosis not present

## 2021-12-06 DIAGNOSIS — E118 Type 2 diabetes mellitus with unspecified complications: Secondary | ICD-10-CM | POA: Diagnosis not present

## 2021-12-06 LAB — C-REACTIVE PROTEIN: CRP: 1.4 mg/dL — ABNORMAL HIGH (ref <1.0)

## 2021-12-06 LAB — CBC WITH DIFFERENTIAL/PLATELET
Abs Immature Granulocytes: 0.01 10*3/uL (ref 0.00–0.07)
Basophils Absolute: 0.1 10*3/uL (ref 0.0–0.1)
Basophils Relative: 1 %
Eosinophils Absolute: 0.1 10*3/uL (ref 0.0–0.5)
Eosinophils Relative: 3 %
HCT: 27.8 % — ABNORMAL LOW (ref 36.0–46.0)
Hemoglobin: 8.9 g/dL — ABNORMAL LOW (ref 12.0–15.0)
Immature Granulocytes: 0 %
Lymphocytes Relative: 30 %
Lymphs Abs: 1.4 10*3/uL (ref 0.7–4.0)
MCH: 30.6 pg (ref 26.0–34.0)
MCHC: 32 g/dL (ref 30.0–36.0)
MCV: 95.5 fL (ref 80.0–100.0)
Monocytes Absolute: 0.5 10*3/uL (ref 0.1–1.0)
Monocytes Relative: 11 %
Neutro Abs: 2.6 10*3/uL (ref 1.7–7.7)
Neutrophils Relative %: 55 %
Platelets: 114 10*3/uL — ABNORMAL LOW (ref 150–400)
RBC: 2.91 MIL/uL — ABNORMAL LOW (ref 3.87–5.11)
RDW: 13.8 % (ref 11.5–15.5)
WBC: 4.7 10*3/uL (ref 4.0–10.5)
nRBC: 0 % (ref 0.0–0.2)

## 2021-12-06 LAB — CULTURE, BLOOD (ROUTINE X 2)
Culture: NO GROWTH
Culture: NO GROWTH

## 2021-12-06 LAB — COMPREHENSIVE METABOLIC PANEL
ALT: 21 U/L (ref 0–44)
AST: 36 U/L (ref 15–41)
Albumin: 2.6 g/dL — ABNORMAL LOW (ref 3.5–5.0)
Alkaline Phosphatase: 113 U/L (ref 38–126)
Anion gap: 7 (ref 5–15)
BUN: 60 mg/dL — ABNORMAL HIGH (ref 8–23)
CO2: 23 mmol/L (ref 22–32)
Calcium: 7.7 mg/dL — ABNORMAL LOW (ref 8.9–10.3)
Chloride: 104 mmol/L (ref 98–111)
Creatinine, Ser: 3.48 mg/dL — ABNORMAL HIGH (ref 0.44–1.00)
GFR, Estimated: 14 mL/min — ABNORMAL LOW (ref >60)
Glucose, Bld: 100 mg/dL — ABNORMAL HIGH (ref 70–99)
Potassium: 4.1 mmol/L (ref 3.5–5.1)
Sodium: 134 mmol/L — ABNORMAL LOW (ref 135–145)
Total Bilirubin: 0.7 mg/dL (ref 0.3–1.2)
Total Protein: 6.4 g/dL — ABNORMAL LOW (ref 6.5–8.1)

## 2021-12-06 LAB — CULTURE, BODY FLUID W GRAM STAIN -BOTTLE: Culture: NO GROWTH

## 2021-12-06 LAB — GLUCOSE, CAPILLARY
Glucose-Capillary: 106 mg/dL — ABNORMAL HIGH (ref 70–99)
Glucose-Capillary: 118 mg/dL — ABNORMAL HIGH (ref 70–99)
Glucose-Capillary: 150 mg/dL — ABNORMAL HIGH (ref 70–99)
Glucose-Capillary: 112 mg/dL — ABNORMAL HIGH (ref 70–99)

## 2021-12-06 LAB — ACID FAST SMEAR (AFB, MYCOBACTERIA): Acid Fast Smear: NEGATIVE

## 2021-12-06 LAB — ANCA TITERS
Atypical P-ANCA titer: <1:20 {titer}
C-ANCA: <1:20 {titer}
P-ANCA: <1:20 {titer}

## 2021-12-06 LAB — ANTINUCLEAR ANTIBODIES, IFA: ANA Ab, IFA: NEGATIVE

## 2021-12-06 LAB — D-DIMER, QUANTITATIVE: D-Dimer, Quant: 2.64 ug/mL-FEU — ABNORMAL HIGH (ref 0.00–0.50)

## 2021-12-06 LAB — TOTAL BILIRUBIN, BODY FLUID: Total bilirubin, fluid: <0.2 mg/dL

## 2021-12-06 LAB — PHOSPHORUS: Phosphorus: 4.5 mg/dL (ref 2.5–4.6)

## 2021-12-06 LAB — FERRITIN: Ferritin: 35 ng/mL (ref 11–307)

## 2021-12-06 LAB — MAGNESIUM: Magnesium: 2 mg/dL (ref 1.7–2.4)

## 2021-12-06 MED ORDER — FUROSEMIDE 10 MG/ML IJ SOLN
40.0000 mg | Freq: Once | INTRAMUSCULAR | Status: AC
  Administered 2021-12-06: 40 mg via INTRAVENOUS
  Filled 2021-12-06: qty 4

## 2021-12-06 MED ORDER — TRAZODONE HCL 50 MG PO TABS
50.0000 mg | ORAL_TABLET | Freq: Every evening | ORAL | Status: DC | PRN
Start: 2021-12-06 — End: 2021-12-28
  Administered 2021-12-09 – 2021-12-26 (×10): 50 mg via ORAL
  Filled 2021-12-06 (×10): qty 1

## 2021-12-06 MED ORDER — ALBUMIN HUMAN 25 % IV SOLN
12.5000 g | Freq: Once | INTRAVENOUS | Status: AC
  Administered 2021-12-06: 12.5 g via INTRAVENOUS
  Filled 2021-12-06: qty 50

## 2021-12-06 NOTE — Progress Notes (Signed)
PROGRESS NOTE    Stacey Chambers  OFB:[id number] DOB: 09-Nov-1950 DOA: 12/01/2021 PCP: Trey Sailors, PA   Brief Narrative:  Stacey Chambers is a 71 y.o. female with medical history significant of for but not limited to chronic iron deficiency anemia, diabetes mellitus type 2, hypertension, hyperlipidemia, hypothyroidism, history of CKD stage IIIb as well as other comorbidities who presents with worsening lower extremity swelling and elevated blood pressure.  Patient states that she started feeling fatigued and tired since Friday of last week is noticed that her legs started having worsening swelling.  She states that whenever she ambulates she gets very dyspneic on exertion and very fatigued.  States that she denies any abdominal pain and denies any cough, chest pain or lightheadedness or dizziness.  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  She had lower extremity swelling and given her dyspnea on exertion we will obtain an echocardiogram and evaluate for heart failure.  She is also noted to have AKI on CKD stage IIIb.  Incidentally she was found to be COVID-positive.  Gastroenterology was consulted and [facility] was asked to admit this patient for lower extremity swelling.  Interventional radiology was consulted for a paracentesis and this has been done and she underwent a diagnostic tap.   **Interim History  Her edema has improved however renal function is worsened.  She is not complaining of any chest pain or shortness of breath.  Abdomen is doing okay today.  She has been consulted and doing further work-up and nephrology is also been consulted given her worsening renal function   Nephrology has placed the patient on low-dose IV fluids at 65 MLS per hour but now have reduced it to 40 mL/hour now that creatinine is leveling off.   Chest x-ray done today and "New symmetric airspace disease, most likely due to pulmonary edema. Suspect small layering bilateral pleural effusions" to keep a close eye on this.  Nephrology evaluated and given the emergence of vascular congestion on chest x-ray there is discontinued IV fluids at this time and agreed to give her a trial of albumin and Lasix now to help augment diuresis.  After the Lasix she felt a little dizzy though  Assessment and Plan:  New onset Decompensated liver cirrhosis with portal hypertension and associated mild splenomegaly and moderate ascites Mildly elevated AST -Unclear etiology of her liver cirrhosis so we have consulted GI for further evaluation recommendations -Check acute hepatitis panel and Negative  -Obtaining diagnostic paracentesis for moderate volume ascites and may need a therapeutic thoracentesis; diagnostic paracentesis yielded 180 mils of hazy fluid and this was sent off for fluid analysis and not consistent with SBP -Given IV Lasix 20 mg x 2 and now on gentle IVF with NS at 65 mL/hr and it was reduced to 40 mL/hr and changed to Sodium Bicarbonate gtt -AST was 36 -> 40 -> 34 is now 36 -Ammonia Level was 20 and PT-INR was 17.4/1.4 respectively  -We will defer diuretics to further GI evaluation recommendations -She has had alcohol in the past but denies any currently; has been placed on Solu-Medrol half a milligram per kilogram every 12 for 3 days given her COVID-positive and will stop -Check echocardiogram and showed EF of 60-65% and G2DD -GIs been consulted for further evaluation recommendations and recommending starting 2 g sodium diet and check an AFP as well as checking  an ANA, mitochondrial antibody, smooth muscle antibody, ceruloplasmin and alpha-1 antitrypsin; ASMA is pending -Does not need an EGD as this was recently done as outlined but she had no varices -GI feels that she would benefit from low-dose diuretics however they are hesitant to  initiate given her worsening renal status and significant CKD -She does have a history of alcoholism and use in the past years but GI feels that 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 but they are ruling out other etiologies for her cirrhosis with ascites; her alpha-1 antitrypsin level is within normal limits and rest of the autoimmune work-up is still pending -Ceruloplasmin was 16.4 and on the lower side -Further Care per GI and she has had no evidence for hemochromatosis and her [place] screening showed a nonfocal lesion on unenhanced CT scan with no reported liver lesion ultrasound and normal AFP -She had no SBP on recent paracentesis and has grade 2 diastolic dysfunction on echo and she had no esophageal varices back in March 2023 -GI recommending 2 g sodium restricted diet and diuretics when renal function allows but now IV fluids are stopped and she is getting albumin and Lasix per nephrology   Dyspnea on Exertion r/o CHF (Unclear type), improving -Noted to have an elevated BNP on admission 715 -Placed on Combivent 1 puff IH every 6 scheduled -Check ECHOCardiogram and showed G2DD and EF was 60-65% -Received IV Lasix 20 mg x2 and now on IVF with this is now stopping and getting a dose of albumin and Lasix -D-Dimer Elevated but likely in the setting of COVID and is now trending upwards -May consider V/Q Scan given D-Dimer being elevated -D/G Chest X-ray yesterday showed "Mild interstitial edema. Mild patchy consolidation of right lung base, superimposed pneumonia is not excluded." -SpO2: 97 % -We will consider getting a VQ scan if continues to worsen -Repeat CXR in the AM    COVID-19 Disease -Currently asymptomatic and chest x-ray and chest CT scan done -Check inflammatory markers and D-dimer was elevated at 5.88 and fibrinogen was 371 -CXR as Above -Check Blood Cx x2 and Procalcitonin -Inflammatory Marker Trend:  COVID-19 Labs  Recent Labs    12/04/21 0541 12/05/21 0501  12/06/21 0547  DDIMER 4.45* 2.78* 2.64*  FERRITIN 31 32 35  CRP 0.7 1.4* 1.4*    Lab Results  Component Value Date   SARSCOV2NAA POSITIVE (A) 12/01/2021       -SpO2: 100 %; Not requiring any Supplemental O2 via [place]  -Airborne and Contract Precautions -Will place on Combivent 1 puff IH q6h -Started Molnupiravir 800 mg po BID x5 Days; -Antitussives with Robitussin DM and Tussionex  -Started Steroids Solumedrol 0.5 mg/kg x12 hours for 3 days and then 50 mg po daily have now stopped since her CRP is not elevated -Start Zinc and Vitamin C   Hypertensive Urgency -BP was elevated on Admission -C/w Amlodipine 2.5 mg pi Daily and Metoprolol Succinate 50 mg po daily  -Added Hydralazine 10 mg q6hprn for SBP>180 or DBP>100 -Continue to Monitor BP per Protocol -Last BP reading was elevated  now and is 147/6   Hx of Gastric Ulcer/GERD/GI Prophylaxis -C/w PPI BID   Diabetes Mellitus Type 2 -Hold Home Glipizide -Check HbA1c and was 4.9 -Place on Moderate Novolog SSI AC/HS -Continue to Monitor and Trend Blood Sugars carefully  -CBGs ranging from 106-118   Hypothyroidism -Check TSH was 2.793 -C/w Levothyroxine 25 mcg po Daily   AKI on likely CKD stage IIIb with concern for  nephrotic syndrome; Could have HepatoRenal Syndrome Type 2 vs GN vs. Other Metabolic Acidosis Hyperphosphatemia Hyponatremia  -Her baseline creatinine has been around 1.4-2.1 but has basically been CKD stage IIIb -Patient's BUNs/creatinine now is trending from 46/2.93 -> 55/3.21 -> 61/3.39 -> 59/3.28 is now worsened at 60/3.48 -She had a slight metabolic acidosis with a CO2 of 17, anion gap of 7, chloride level 112 and now improved on bicarbonate drip but she has a CO2 of 20, anion gap of 6, chloride level of 111 -Given 2 doses of IV Lasix 20 mg given her volume overload and will hold off further Diurectis given worsened Renal fxn so nephrology has started the patient on IV fluid with normal saline at 65 mils per hour  and changed to Sodium Bicarbonate and reduced the rate to 40 mL/hour but given her chest x-ray findings and some vascular congestion they have stopped fluids and starting diuresis along with albumin to augment the diuresis -Per nephrology we will continue holding diuretics for now as she appears to be volume responsive at this time without features of significant volume overload -Check urine osmolality, urine sodium, urine creatinine, and renal ultrasound -Urine studies sent off and total urine protein was 455.73, urine protein creatinine ratio 6.86, urine osmolality was 380, urine sodium was 48, urine creatinine 66.47 -Renal ultrasound done and showed "No hydronephrosis. Increased renal echogenicity, suggesting medical renal disease. Cirrhosis and ascites.";  Nephrology evaluated and they feel that her kidneys are small without hydronephrosis -Urine sediment was done and showed numerous dysmorphic RBCs and 1 RBC casts as well as rare granular casts and mild pyuria -Apart from her intra-abdominal ascites he is not volume overloaded and nephrology recommends continue to hold diuretics as she continues to make urine -We will need strict I's and O's and daily weights -Per nephrology she could have hepatorenal syndrome type II versus glomerulonephritis versus others they are to send off serologies -She is at high risk for ending up on dialysis but she has no uremic signs and does not need dialysis yet.  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 -Patient's Phos level is now 5.1 -> 4.3 -> 4.1 x2 -> 4.5 -Nephrology recommended placing a Foley catheter but this was not done given patient tolerance and now we are obtaining strict I's and O's -Nephrology recommends that she may benefit  at some point from a renal biopsy if renal function does not improve significantly that would most likely be prognostic based on her atrophy kidney seen  and assist with diagnosis -Further care per nephrology   Normocytic Anemia -Patient has a history of iron deficiency and take iron supplementation but will hold this for now pending Anemia Panel -Patient's hemoglobin/hematocrit now 10.2/31.3 -> 8.5/25.8 -> 8.4/26.4 -> 8.0/25.3 -> 7.9/24.8 -> 8.9/27.8 -Anemia panel done and showed an iron level of 21, U IBC of 322, TIBC of 343, saturation ratios of 6%, ferritin level 28, folate level of 28.6 and vitamin B12 931 -Continue to monitor for signs and symptoms of bleeding; no overt bleeding noted -Repeat CBC in a.m.   Hypoalbuminemia -Patient's Albumin Level went from 2.5 -> 2.3x2 -> 2.6 -Continue to Monitor and Trend -Repeat CMP in the AM    Thrombocytopenia -Patient's Platelet Count is now 109 -> 96 -> 113 -> 103 -> 93 ->  114 and possibly in the setting of splenomegaly and new onset liver cirrhosis -Overt signs or symptoms of bleeding; repeat CBC in a.m.  DVT prophylaxis: heparin injection 5,000 Units Start: 12/01/21 2200 SCDs Start: 12/01/21 1826    Code Status: Full Code Family Communication: No family currently at bedside and patient does not want me to call her family members and daughters in [place] or [place] because she states her "daughter are not good to her"  Disposition Plan:  Level of care: Telemetry Status is: Inpatient Remains inpatient appropriate because: His further clinical improvement and clearance by nephrology and gastroenterology   Consultants:  Nephrology Gastroenterology  Procedures:  Renal ultrasound  Antimicrobials:  Anti-infectives (From admission, onward)    Start     Dose/Rate Route Frequency Ordered Stop   12/01/21 1900  molnupiravir EUA (LAGEVRIO) capsule 800 mg        4 capsule Oral 2 times daily 12/01/21 1546 12/06/21 5465         Subjective: Seen and examined at bedside with assistance of the video translator Fuller Song #[id number] and the patient states that she is doing okay but felt a little bit dizzy after she got some of the Lasix.  Denies any chest pain or short of breath.  Does not have any leg swelling now.  States that she could not really sleep last night.  No other concerns or complaints this time  Objective: Vitals:   12/06/21 0500 12/06/21 0642 12/06/21 1334 12/06/21 1658  BP:  (!) 182/83 (!) 165/75 (!) 147/62  Pulse:  68 66 67  Resp:   18 18  Temp:  98.5 F (36.9 C) 98.6 F (37 C)   TempSrc:  Oral Oral   SpO2:  98% 98% 97%  Weight: 68.4 kg     Height:        Intake/Output Summary (Last 24 hours) at 12/06/2021 1736 Last data filed at 12/06/2021 1700 Gross per 24 hour  Intake 1593.55 ml  Output --  Net 1593.55 ml   Filed Weights   12/04/21 0500 12/05/21 0500 12/06/21 0500  Weight: 67.5 kg 69.4 kg 68.4 kg   Examination: Physical Exam:  Constitutional: WN/WD obese Hispanic female currently in no acute distress Respiratory: Diminished to auscultation bilaterally, no wheezing, rales, rhonchi or crackles. Normal respiratory effort and patient is not tachypenic. No accessory muscle use.  Unlabored breathing and not wearing any supplemental oxygen is again Cardiovascular: RRR, no murmurs / rubs / gallops. S1 and S2 auscultated.  No appreciable extremity edema Abdomen: Soft, non-tender, distended secondary habitus.  Bowel sounds positive.  GU: Deferred. Musculoskeletal: No clubbing / cyanosis of digits/nails. No joint deformity upper and lower extremities.  Neurologic: CN 2-12 grossly intact with no focal deficits.  Romberg sign and cerebellar reflexes not assessed.  Psychiatric: Normal judgment and insight. Alert and oriented x 3. Normal mood and appropriate affect.   Data Reviewed: I have personally reviewed following labs and imaging studies  CBC: Recent Labs  Lab 12/02/21 0458 12/03/21 0553  12/04/21 0541 12/05/21 0501 12/06/21 0547  WBC 3.5* 7.0 5.6 4.7 4.7  NEUTROABS 2.9 5.1 4.1 3.1 2.6  HGB 8.5* 8.4* 8.0* 7.9* 8.9*  HCT 25.8* 26.4* 25.3* 24.8* 27.8*  MCV 94.9 97.1 96.9 96.1 95.5  PLT 96* 113* 103* 93* 174*   Basic Metabolic Panel: Recent Labs  Lab 12/02/21 0458 12/03/21 0553 12/04/21 0541 12/05/21 0501 12/06/21 0547  NA 135 134* 136 137 134*  K 4.0 3.9 4.1 4.0 4.1  CL 111  108 112* 111 104  CO2 18* 17* 17* 20* 23  GLUCOSE 140* 99 105* 99 100*  BUN 55* 61* 66* 59* 60*  CREATININE 3.21* 3.39* 3.46* 3.28* 3.48*  CALCIUM 8.2* 7.9* 7.7* 7.7* 7.7*  MG 2.1 2.2 2.0 2.0 2.0  PHOS 5.1* 4.3 4.1 4.1 4.5   GFR: Estimated Creatinine Clearance: 12.7 mL/min (A) (by C-G formula based on SCr of 3.48 mg/dL (H)). Liver Function Tests: Recent Labs  Lab 12/02/21 0458 12/03/21 0553 12/04/21 0541 12/05/21 0501 12/06/21 0547  AST 40 36 40 34 36  ALT 21 20 24 21 21   ALKPHOS 111 107 97 104 113  BILITOT 0.7 0.6 0.6 0.9 0.7  PROT 6.3* 6.5 5.9* 5.9* 6.4*  ALBUMIN 2.3* 2.5* 2.3* 2.3* 2.6*   Recent Labs  Lab 12/01/21 1212  LIPASE 94*   Recent Labs  Lab 12/01/21 1652  AMMONIA 20   Coagulation Profile: Recent Labs  Lab 12/01/21 1435  INR 1.4*   Cardiac Enzymes: No results for input(s): CKTOTAL, CKMB, CKMBINDEX, TROPONINI in the last 168 hours. BNP (last 3 results) No results for input(s): PROBNP in the last 8760 hours. HbA1C: No results for input(s): HGBA1C in the last 72 hours. CBG: Recent Labs  Lab 12/05/21 1651 12/05/21 2216 12/06/21 0901 12/06/21 1257 12/06/21 1648  GLUCAP 131* 118* 106* 118* 150*   Lipid Profile: No results for input(s): CHOL, HDL, LDLCALC, TRIG, CHOLHDL, LDLDIRECT in the last 72 hours. Thyroid Function Tests: No results for input(s): TSH, T4TOTAL, FREET4, T3FREE, THYROIDAB in the last 72 hours. Anemia Panel: Recent Labs    12/05/21 0501 12/06/21 0547  FERRITIN 32 35   Sepsis Labs: Recent Labs  Lab 12/01/21 1704   PROCALCITON <0.10    Recent Results (from the past 240 hour(s))  SARS Coronavirus 2 by RT PCR (hospital order, performed in [facility] hospital lab) *cepheid single result test* Anterior Nasal Swab     Status: Abnormal   Collection Time: 12/01/21 12:12 PM   Specimen: Anterior Nasal Swab  Result Value Ref Range Status   SARS Coronavirus 2 by RT PCR POSITIVE (A) NEGATIVE Final    Comment: (NOTE) SARS-CoV-2 target nucleic acids are DETECTED  SARS-CoV-2 RNA is generally detectable in upper respiratory specimens  during the acute phase of infection.  Positive results are indicative  of the presence of the identified virus, but do not rule out bacterial infection or co-infection with other pathogens not detected by the test.  Clinical correlation with patient history and  other diagnostic information is necessary to determine patient infection status.  The expected result is negative.  Fact Sheet for Patients:   https://www.patel.info/   Fact Sheet for Healthcare Providers:   https://hall.com/    This test is not yet approved or cleared by the [place] FDA and  has been authorized for detection and/or diagnosis of SARS-CoV-2 by FDA under an Emergency Use Authorization (EUA).  This EUA will remain in effect (meaning this test can be used) for the duration of  the COVID-19 declaration under Section 564(b)(1)  of the Act, 21 U.S.C. section 360-bbb-3(b)(1), unless the authorization is terminated or revoked sooner.   Performed at [facility], [place] [address], [place], [place] [address]   Fungus Culture With Stain     Status: None (Preliminary result)   Collection Time: 12/01/21  4:27 PM   Specimen: Abdomen; Peritoneal Fluid  Result Value Ref Range Status   Fungus Stain Final report  Final    Comment: (  NOTE) Performed At: [facility] [place], [place] [id number] Rush Farmer MD  [id number]    Fungus (Mycology) Culture PENDING  Incomplete   Fungal Source PERITONEAL  Final    Comment: Performed at [facility], [place] [address] [place], [place] [address]  Acid Fast Smear (AFB)     Status: None   Collection Time: 12/01/21  4:27 PM   Specimen: Abdomen; Peritoneal Fluid  Result Value Ref Range Status   AFB Specimen Processing Concentration  Final   Acid Fast Smear Negative  Final    Comment: (NOTE) Performed At: [facility] [place], [place] [id number] Rush Farmer MD [id number]    Source (AFB) PERITONEAL  Final    Comment: Performed at [facility], [place] [address], [place], [place] [address]  Fungus Culture Result     Status: None   Collection Time: 12/01/21  4:27 PM  Result Value Ref Range Status   Result 1 Comment  Final    Comment: (NOTE) KOH/Calcofluor preparation:  no fungus observed. Performed At: [facility] [place], [place] [id number] Rush Farmer MD [id number]   Culture, blood (Routine X 2) w Reflex to ID Panel     Status: None   Collection Time: 12/01/21  4:52 PM   Specimen: BLOOD  Result Value Ref Range Status   Specimen Description   Final    BLOOD SITE NOT SPECIFIED Performed at [place] [address] [place], [place] [address]    Special Requests   Final    BOTTLES DRAWN AEROBIC AND ANAEROBIC Blood Culture results may not be optimal due to an inadequate volume of blood received in culture bottles Performed at [place] [address], [place], [place] [address]    Culture   Final    NO GROWTH 5 DAYS Performed at [facility], [place] [address] [place], [place] [address]    Report Status 12/06/2021 FINAL  Final  Culture, blood (Routine X 2) w Reflex to ID Panel     Status: None   Collection Time: 12/01/21  8:10 PM   Specimen: BLOOD  Result Value Ref Range Status   Specimen Description    Final    BLOOD BLOOD RIGHT HAND Performed at [place] [address], [place], [place] [address]    Special Requests   Final    IN PEDIATRIC BOTTLE Blood Culture results may not be optimal due to an excessive volume of blood received in culture bottles Performed at [place] [address], [place], [place] [address]    Culture   Final    NO GROWTH 5 DAYS Performed at [facility], [place] [address] [place], [place] [address]    Report Status 12/06/2021 FINAL  Final  Culture, body fluid w Gram Stain-bottle     Status: None   Collection Time: 12/01/21  9:41 PM   Specimen: Peritoneal Washings  Result Value Ref Range Status   Specimen Description PERITONEAL  Final   Special Requests NONE  Final   Culture   Final    NO GROWTH 5 DAYS Performed at [place] [address], [place], [place] [address]    Report Status 12/06/2021 FINAL  Final  Gram stain     Status: None   Collection Time: 12/01/21  9:41 PM   Specimen: Peritoneal Washings  Result Value Ref Range Status  Specimen Description PERITONEAL  Final   Special Requests NONE  Final   Gram Stain   Final    NO WBC SEEN NO ORGANISMS SEEN Performed at [facility], [address] [place], [place] [address]    Report Status 12/01/2021 FINAL  Final    Radiology Studies: DG CHEST PORT 1 VIEW  Result Date: 12/05/2021 CLINICAL DATA:  Hypertension and leg edema. EXAM: PORTABLE CHEST 1 VIEW COMPARISON:  December 04, 2021 FINDINGS: The heart size and mediastinal contours are within normal limits. Mild increased pulmonary interstitium is identified bilaterally. Minimal right pleural effusion is noted. Mild patchy consolidation of right lung base is noted. The visualized skeletal structures are stable. IMPRESSION: 1. Mild interstitial edema. 2. Mild patchy consolidation of right lung base, superimposed pneumonia is not excluded. Electronically Signed   By: Abelardo Diesel M.D.   On:  12/05/2021 07:26    Scheduled Meds:  amLODipine  2.5 mg Oral Daily   vitamin C  500 mg Oral Daily   docusate sodium  100 mg Oral BID   folic acid  1 mg Oral Daily   heparin  5,000 Units Subcutaneous Q8H   insulin aspart  0-15 Units Subcutaneous TID WC   insulin aspart  0-5 Units Subcutaneous QHS   levothyroxine  25 mcg Oral Daily   melatonin  3 mg Oral Once   metoprolol succinate  50 mg Oral Daily   multivitamin with minerals  1 tablet Oral Daily   pantoprazole  40 mg Oral BID   thiamine  100 mg Oral Daily   zinc sulfate  220 mg Oral Daily   Continuous Infusions:   LOS: 5 days   Raiford Noble, DO [facility] Available via Epic secure chat 7am-7pm After these hours, please refer to coverage provider listed on [email or web address] 12/06/2021, 5:36 PM

## 2021-12-06 NOTE — Progress Notes (Signed)
Patient ID: Stacey Chambers, female   DOB: Oct 24, 1950, 71 y.o.   MRN: [id number] [facility] Progress Note   Assessment/ Plan:   1. Acute kidney Injury on CKD Stage IV: With baseline creatinine of 2.1 after gradual progression over the past year.  Urine output on charted from overnight with slightly worse renal function noted on labs this morning.  Given emerging vascular congestion on chest x-ray, will discontinue intravenous fluids at this time and give a trial of albumin/Lasix. 2. 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  We will give a trial of albumin/Lasix today to help augment diuresis. 3. Hypertensive urgency: blood pressures remain intermittently elevated on low dose amlodipine, metoprolol and PRN hydralazine.  4. 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   Subjective:   Complains of some fatigue and had nausea earlier.  Denies any chest pain or shortness of breath.   Objective:   BP (!) 182/83   Pulse 68   Temp 98.5 F (36.9 C) (Oral)   Resp 19   Ht 4\' 10"  (1.473 m)   Wt 68.4 kg   SpO2 98%   BMI 31.52 kg/m   Intake/Output Summary (Last 24 hours) at 12/06/2021 1251 Last data filed at 12/06/2021 4235 Gross per 24 hour  Intake 1593.55 ml  Output --  Net 1593.55 ml   Weight change: -1 kg  Physical Exam: TIR:[id number] resting in bed YPP:JKDTO regular rhythm and normal rate, normal s1 and s2 Resp: Decreased breath sounds over bases even with deep inspiratory effort. IZT:IWPY, slightly distended, non-tender, bowel sounds normal Ext: Trace edema over lower back  Imaging: DG CHEST PORT 1 VIEW  Result Date: 12/05/2021 CLINICAL DATA:  Hypertension and leg edema. EXAM: PORTABLE CHEST 1 VIEW COMPARISON:  December 04, 2021 FINDINGS: The heart size and mediastinal contours are  within normal limits. Mild increased pulmonary interstitium is identified bilaterally. Minimal right pleural effusion is noted. Mild patchy consolidation of right lung base is noted. The visualized skeletal structures are stable. IMPRESSION: 1. Mild interstitial edema. 2. Mild patchy consolidation of right lung base, superimposed pneumonia is not excluded. Electronically Signed   By: Abelardo Diesel M.D.   On: 12/05/2021 07:26    Labs: BMET Recent Labs  Lab 12/01/21 1212 12/02/21 0458 12/03/21 0553 12/04/21 0541 12/05/21 0501 12/06/21 0547  NA 136 135 134* 136 137 134*  K 4.1 4.0 3.9 4.1 4.0 4.1  CL 109 111 108 112* 111 104  CO2 18* 18* 17* 17* 20* 23  GLUCOSE 125* 140* 99 105* 99 100*  BUN 46* 55* 61* 66* 59* 60*  CREATININE 2.93* 3.21* 3.39* 3.46* 3.28* 3.48*  CALCIUM 8.5* 8.2* 7.9* 7.7* 7.7* 7.7*  PHOS  --  5.1* 4.3 4.1 4.1 4.5   CBC Recent Labs  Lab 12/03/21 0553 12/04/21 0541 12/05/21 0501 12/06/21 0547  WBC 7.0 5.6 4.7 4.7  NEUTROABS 5.1 4.1 3.1 2.6  HGB 8.4* 8.0* 7.9* 8.9*  HCT 26.4* 25.3* 24.8* 27.8*  MCV 97.1 96.9 96.1 95.5  PLT 113* 103* 93* 114*   Medications:     amLODipine  2.5 mg Oral Daily   vitamin C  500 mg Oral Daily   docusate sodium  100 mg Oral BID   folic acid  1 mg Oral Daily   heparin  5,000 Units Subcutaneous Q8H   insulin aspart  0-15 Units Subcutaneous  TID WC   insulin aspart  0-5 Units Subcutaneous QHS   levothyroxine  25 mcg Oral Daily   melatonin  3 mg Oral Once   metoprolol succinate  50 mg Oral Daily   multivitamin with minerals  1 tablet Oral Daily   pantoprazole  40 mg Oral BID   thiamine  100 mg Oral Daily   zinc sulfate  220 mg Oral Daily   [name], MD 12/06/2021, 12:51 PM

## 2021-12-06 NOTE — Consult Note (Addendum)
   [facility] CM Inpatient Consult   12/06/2021  [place] Latulippe January 10, 1951 [id number]  [facility] Coverage for Hospital Liaison, remote  [facility] [ACO] Patient: [place] Medicare  Primary Care Provider:  Trey Sailors, [place], [facility]   Patient screened for hypertensive, COVID positive hospitalization with noted high risk score for unplanned readmission risk and to assess for potential [facility] service needs for post hospital transition.  Reviewed for high risk and barriers for post hospital care. Plan:  Continue to follow progress and disposition to assess for post hospital care management needs.    For questions contact:   Natividad Brood, RN BSN [facility]iaison  [phone] business mobile phone Toll free office [phone]  Fax number: [phone] [name]

## 2021-12-07 ENCOUNTER — Inpatient Hospital Stay (HOSPITAL_COMMUNITY): Payer: Medicare Other

## 2021-12-07 DIAGNOSIS — K7460 Unspecified cirrhosis of liver: Secondary | ICD-10-CM | POA: Diagnosis not present

## 2021-12-07 DIAGNOSIS — K7031 Alcoholic cirrhosis of liver with ascites: Secondary | ICD-10-CM

## 2021-12-07 DIAGNOSIS — N189 Chronic kidney disease, unspecified: Secondary | ICD-10-CM | POA: Diagnosis not present

## 2021-12-07 DIAGNOSIS — I10 Essential (primary) hypertension: Secondary | ICD-10-CM | POA: Diagnosis not present

## 2021-12-07 DIAGNOSIS — D696 Thrombocytopenia, unspecified: Secondary | ICD-10-CM

## 2021-12-07 DIAGNOSIS — R188 Other ascites: Secondary | ICD-10-CM | POA: Diagnosis not present

## 2021-12-07 DIAGNOSIS — E118 Type 2 diabetes mellitus with unspecified complications: Secondary | ICD-10-CM | POA: Diagnosis not present

## 2021-12-07 DIAGNOSIS — N179 Acute kidney failure, unspecified: Secondary | ICD-10-CM | POA: Diagnosis not present

## 2021-12-07 DIAGNOSIS — E8770 Fluid overload, unspecified: Secondary | ICD-10-CM

## 2021-12-07 LAB — RENAL FUNCTION PANEL
Albumin: 2.5 g/dL — ABNORMAL LOW (ref 3.5–5.0)
Anion gap: 10 (ref 5–15)
BUN: 59 mg/dL — ABNORMAL HIGH (ref 8–23)
CO2: 22 mmol/L (ref 22–32)
Calcium: 8.1 mg/dL — ABNORMAL LOW (ref 8.9–10.3)
Chloride: 106 mmol/L (ref 98–111)
Creatinine, Ser: 3.46 mg/dL — ABNORMAL HIGH (ref 0.44–1.00)
GFR, Estimated: 14 mL/min — ABNORMAL LOW (ref >60)
Glucose, Bld: 100 mg/dL — ABNORMAL HIGH (ref 70–99)
Phosphorus: 4.8 mg/dL — ABNORMAL HIGH (ref 2.5–4.6)
Potassium: 4 mmol/L (ref 3.5–5.1)
Sodium: 138 mmol/L (ref 135–145)

## 2021-12-07 LAB — COMPREHENSIVE METABOLIC PANEL
ALT: 19 U/L (ref 0–44)
AST: 31 U/L (ref 15–41)
Albumin: 2.4 g/dL — ABNORMAL LOW (ref 3.5–5.0)
Alkaline Phosphatase: 104 U/L (ref 38–126)
Anion gap: 10 (ref 5–15)
BUN: 59 mg/dL — ABNORMAL HIGH (ref 8–23)
CO2: 22 mmol/L (ref 22–32)
Calcium: 8 mg/dL — ABNORMAL LOW (ref 8.9–10.3)
Chloride: 105 mmol/L (ref 98–111)
Creatinine, Ser: 3.41 mg/dL — ABNORMAL HIGH (ref 0.44–1.00)
GFR, Estimated: 14 mL/min — ABNORMAL LOW (ref >60)
Glucose, Bld: 98 mg/dL (ref 70–99)
Potassium: 3.9 mmol/L (ref 3.5–5.1)
Sodium: 137 mmol/L (ref 135–145)
Total Bilirubin: 0.9 mg/dL (ref 0.3–1.2)
Total Protein: 6.3 g/dL — ABNORMAL LOW (ref 6.5–8.1)

## 2021-12-07 LAB — GLUCOSE, CAPILLARY
Glucose-Capillary: 110 mg/dL — ABNORMAL HIGH (ref 70–99)
Glucose-Capillary: 117 mg/dL — ABNORMAL HIGH (ref 70–99)
Glucose-Capillary: 98 mg/dL (ref 70–99)
Glucose-Capillary: 117 mg/dL — ABNORMAL HIGH (ref 70–99)
Glucose-Capillary: 111 mg/dL — ABNORMAL HIGH (ref 70–99)

## 2021-12-07 LAB — CBC WITH DIFFERENTIAL/PLATELET
Abs Immature Granulocytes: 0.01 10*3/uL (ref 0.00–0.07)
Basophils Absolute: 0 10*3/uL (ref 0.0–0.1)
Basophils Relative: 1 %
Eosinophils Absolute: 0.1 10*3/uL (ref 0.0–0.5)
Eosinophils Relative: 3 %
HCT: 27.4 % — ABNORMAL LOW (ref 36.0–46.0)
Hemoglobin: 8.8 g/dL — ABNORMAL LOW (ref 12.0–15.0)
Immature Granulocytes: 0 %
Lymphocytes Relative: 23 %
Lymphs Abs: 1 10*3/uL (ref 0.7–4.0)
MCH: 31 pg (ref 26.0–34.0)
MCHC: 32.1 g/dL (ref 30.0–36.0)
MCV: 96.5 fL (ref 80.0–100.0)
Monocytes Absolute: 0.5 10*3/uL (ref 0.1–1.0)
Monocytes Relative: 11 %
Neutro Abs: 2.7 10*3/uL (ref 1.7–7.7)
Neutrophils Relative %: 62 %
Platelets: 111 10*3/uL — ABNORMAL LOW (ref 150–400)
RBC: 2.84 MIL/uL — ABNORMAL LOW (ref 3.87–5.11)
RDW: 13.7 % (ref 11.5–15.5)
WBC: 4.4 10*3/uL (ref 4.0–10.5)
nRBC: 0 % (ref 0.0–0.2)

## 2021-12-07 LAB — MAGNESIUM
Magnesium: 1.9 mg/dL (ref 1.7–2.4)
Magnesium: 2 mg/dL (ref 1.7–2.4)

## 2021-12-07 LAB — HEPATITIS B SURFACE ANTIBODY,QUALITATIVE

## 2021-12-07 LAB — PHOSPHORUS
Phosphorus: 4.8 mg/dL — ABNORMAL HIGH (ref 2.5–4.6)
Phosphorus: 4.8 mg/dL — ABNORMAL HIGH (ref 2.5–4.6)

## 2021-12-07 MED ORDER — FUROSEMIDE 10 MG/ML IJ SOLN
40.0000 mg | Freq: Once | INTRAMUSCULAR | Status: AC
  Administered 2021-12-07: 40 mg via INTRAVENOUS
  Filled 2021-12-07: qty 4

## 2021-12-07 MED ORDER — ALBUMIN HUMAN 25 % IV SOLN
12.5000 g | Freq: Once | INTRAVENOUS | Status: AC
Start: 2021-12-07 — End: 2021-12-07
  Administered 2021-12-07: 12.5 g via INTRAVENOUS
  Filled 2021-12-07 (×3): qty 50

## 2021-12-07 MED ORDER — ALBUMIN HUMAN 25 % IV SOLN
12.5000 g | Freq: Once | INTRAVENOUS | Status: AC
  Administered 2021-12-07: 12.5 g via INTRAVENOUS
  Filled 2021-12-07: qty 50

## 2021-12-07 NOTE — Progress Notes (Addendum)
[facility] Progress Note  CC:  Cirrhosis  Subjective:  No new complaints.  No abdominal pain.  Objective:  Vital signs in last 24 hours: Temp:  [98.5 F (36.9 C)-98.6 F (37 C)] 98.5 F (36.9 C) (06/07 0531) Pulse Rate:  [64-72] 64 (06/07 0531) Resp:  [18-22] 19 (06/07 0531) BP: (147-174)/(62-86) 169/80 (06/07 0531) SpO2:  [97 %-99 %] 98 % (06/07 0531) Last BM Date : 12/06/21 General:  Alert, Well-developed, in NAD Heart:  Regular rate and rhythm; no murmurs Pulm:  CTAB.  No W/R/R. Abdomen:  Soft with some distention.  BS present.  Non-tender.   Extremities:  Without edema.  Intake/Output from previous day: 06/06 0701 - 06/07 0700 In: 960 [P.O.:960] Out: 1000 [Urine:1000]  Lab Results: Recent Labs    12/05/21 0501 12/06/21 0547 12/07/21 0459  WBC 4.7 4.7 4.4  HGB 7.9* 8.9* 8.8*  HCT 24.8* 27.8* 27.4*  PLT 93* 114* 111*   BMET Recent Labs    12/05/21 0501 12/06/21 0547 12/07/21 0459  NA 137 134* 138  137  K 4.0 4.1 4.0  3.9  CL 111 104 106  105  CO2 20* 23 22  22   GLUCOSE 99 100* 100*  98  BUN 59* 60* 59*  59*  CREATININE 3.28* 3.48* 3.46*  3.41*  CALCIUM 7.7* 7.7* 8.1*  8.0*   LFT Recent Labs    12/07/21 0459  PROT 6.3*  ALBUMIN 2.5*  2.4*  AST 31  ALT 19  ALKPHOS 104  BILITOT 0.9   DG CHEST PORT 1 VIEW  Result Date: 12/07/2021 CLINICAL DATA:  Shortness of breath.  Coronavirus infection. EXAM: PORTABLE CHEST 1 VIEW COMPARISON:  12/05/2021.  12/04/2021.  12/01/2021. FINDINGS: Apparent enlargement of bilateral pleural effusions, more on the right than the left. Abnormal density the lung that could be due to a combination of edema, atelectasis and pneumonia, worse on the right than the left. IMPRESSION: Most consistent with fluid overload/congestive heart failure. Enlarging effusions right more than left. Mild interstitial edema. Atelectasis related to the effusions. Coexistent pneumonia not excluded. Electronically Signed    By: Nelson Chimes M.D.   On: 12/07/2021 07:40    Assessment / Plan: # Cirrhosis, decompensated with ascites. New diagnosis. Possibly Etoh related. Clinical course complicated with AKI on CKD Negative HBsAg, HCV ab, ANA, AMA and A-1 antitrypsin. No evidence for hemochromatosis. Ceruloplasmin low at 16.4. ASMA elevated at 52.  [place] screening. No focal lesion on unenhanced CT scan. No reported liver lesions on [place]. Normal AFP Ascites: No SBP on recent dx paracentesis. Only Grade II DD on echo Varices screening: No esophageal varices on EGD in March 2023.   # AKI on CKD. Cr 3.28--3.41, GFR 15-->14. Nephrology following and handling fluid management and diuresis.  We appreciate their help.   # Probable small stone in the region of the gallbladder neck    # COVID19, asymptomatic. Not requiring supplemental 02   # Hypertensive urgency   *We will obtain serum copper level, and a 24-hour urine for copper should wait until the outpatient setting when patient's renal function and urine output hopefully improved. *? If needs further imaging/workup for elevated ASMA.     LOS: 6 days   Laban Emperor. Zehr  12/07/2021, 9:43 AM   I have taken a history, reviewed the chart and examined the patient. I performed a substantive portion of this encounter, including complete performance of at least one of the key components, in conjunction with the  APP. I agree with the APP's note, impression and recommendations  I spoke with the patient at length today via tele-interpretor about her liver disease and renal dysfunction.  The patient became very emotional when asked about her living situation and isolation from family and estrangement from her children.  We discussed what cirrhosis is, and the complications that can occur, including renal dysfunction.  The patient is aware that she should no longer drink any alcohol.  I am not certain that the patient's renal dysfunction is directly attributable to her liver disease  (HRS) as the patient is quite hypertense which is unusual in HRS, and she did have pre-existing renal insufficiency.  Do not think midodrine would be helpful given her hypertension. I will defer to nephrology for initiating diuretics, but I agree with the plan of albumin and lasix for now.    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  Although she already has cirrhosis and is of advanced age and has normal aminotransferases, treatment may still be beneficial.  A biopsy would be necessary to confirm the diagnosis.  Will get baseline IgG levels and tentatively plan for liver biopsy, possibly during this admission.    Lula Michaux E. Candis Schatz, MD [facility]

## 2021-12-07 NOTE — Care Management Important Message (Signed)
Important Message  Patient Details IM Letter placed in Patients room. Name: Stacey Chambers MRN: [id number] Date of Birth: Nov 16, 1950   Medicare Important Message Given:  Yes     Kerin Salen 12/07/2021, 11:13 AM

## 2021-12-07 NOTE — Progress Notes (Signed)
PROGRESS NOTE    Stacey Chambers  GBT:[id number] DOB: [birth year] DOA: 12/01/2021 PCP: Trey Sailors, PA     Brief Narrative:  71 y.o. HF ([name] speaker) PMHx chronic iron deficiency anemia, diabetes mellitus type 2, hypertension, hyperlipidemia, hypothyroidism, history of CKD stage IIIb as well as other comorbidities who presents with worsening lower extremity swelling and elevated blood pressure.  Patient states that she started feeling fatigued and tired since Friday of last week is noticed that her legs started having worsening swelling.  She states that whenever she ambulates she gets very dyspneic on exertion and very fatigued.  States that she denies any abdominal pain and denies any cough, chest pain or lightheadedness or dizziness.  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  She had lower extremity swelling and given her dyspnea on exertion we will obtain an echocardiogram and evaluate for heart failure.  She is also noted to have AKI on CKD stage IIIb.  Incidentally she was found to be COVID-positive.  Gastroenterology was consulted and [facility] was asked to admit this patient for lower extremity swelling.  Interventional radiology was consulted for a paracentesis and this has been done and she underwent a diagnostic tap.   **Interim History  Her edema has improved however renal function is worsened.  She is not complaining of any chest pain or shortness of breath.  Abdomen is doing okay today.  She has been consulted and doing further work-up and nephrology is also been consulted given her worsening renal function   Nephrology has placed the patient on low-dose IV fluids at 65 MLS per hour but now have reduced it to 40 mL/hour now that creatinine is leveling off.  Chest x-ray done today and "New symmetric airspace disease, most  likely due to pulmonary edema. Suspect small layering bilateral pleural effusions" to keep a close eye on this.   Nephrology evaluated and given the emergence of vascular congestion on chest x-ray there is discontinued IV fluids at this time and agreed to give her a trial of albumin and Lasix now to help augment diuresis.  After the Lasix she felt a little dizzy though   Subjective: A/O x4, negative CP, negative SOB.  Patient has the belief that she is going to be placed on the liver transplant list   Assessment & Plan: Covid vaccination;   Principal Problem:   Cirrhosis ([place]) Active Problems:   History of gastric ulcer   History of thrombocytopenia   History of iron deficiency anemia   Gastric ulcer   Dyspnea on exertion   Swelling of lower extremity   Acute kidney injury superimposed on CKD ([place])   Diabetes mellitus type 2 with complications ([place])  New onset Decompensated liver cirrhosis with portal hypertension and associated mild splenomegaly and moderate ascites Mildly elevated AST -Unclear etiology of her liver cirrhosis so we have consulted GI for further evaluation recommendations -Check acute hepatitis panel and Negative  -Obtaining diagnostic paracentesis for moderate volume ascites and may need a therapeutic thoracentesis; diagnostic paracentesis yielded 180 mils of hazy fluid and this was sent off for fluid analysis and not consistent with SBP -Given IV Lasix 20 mg x 2 and now on gentle IVF with NS at 65 mL/hr and it was reduced to 40 mL/hr and changed to Sodium Bicarbonate gtt -AST was 36 -> 40 -> 34 is now 36 -Ammonia  Level was 20 and PT-INR was 17.4/1.4 respectively  -We will defer diuretics to further GI evaluation recommendations -She has had alcohol in the past but denies any currently; has been placed on Solu-Medrol half a milligram per kilogram every 12 for 3 days given her COVID-positive and will stop -Check echocardiogram and showed EF of 60-65% and G2DD -GIs  been consulted for further evaluation recommendations and recommending starting 2 g sodium diet and check an AFP as well as checking an ANA, mitochondrial antibody, smooth muscle antibody, ceruloplasmin and alpha-1 antitrypsin; ASMA is pending -Does not need an EGD as this was recently done as outlined but she had no varices -GI feels that she would benefit from low-dose diuretics however they are hesitant to initiate given her worsening renal status and significant CKD -She does have a history of alcoholism and use in the past years but GI feels that 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 but they are ruling out other etiologies for her cirrhosis with ascites; her alpha-1 antitrypsin level is within normal limits and rest of the autoimmune work-up is still pending -Ceruloplasmin was 16.4 and on the lower side -Further Care per GI and she has had no evidence for hemochromatosis and her [place] screening showed a nonfocal lesion on unenhanced CT scan with no reported liver lesion ultrasound and normal AFP -She had no SBP on recent paracentesis and has grade 2 diastolic dysfunction on echo and she had no esophageal varices back in March 2023 -GI recommending 2 g sodium restricted diet and diuretics when renal function allows but now IV fluids are stopped and she is getting albumin and Lasix per nephrology   Dyspnea on Exertion r/o CHF (Unclear type), improving -Noted to have an elevated BNP on admission 715 -Placed on Combivent 1 puff IH every 6 scheduled -Check ECHOCardiogram and showed G2DD and EF was 60-65% -Received IV Lasix 20 mg x2 and now on IVF with this is now stopping and getting a dose of albumin and Lasix -D-Dimer Elevated but likely in the setting of COVID and is now trending upwards -May consider V/Q Scan given D-Dimer being elevated -D/G Chest X-ray yesterday showed "Mild interstitial edema. Mild patchy consolidation of right lung base, superimposed pneumonia is not  excluded." -SpO2: 97 % -We will consider getting a VQ scan if continues to worsen -Repeat CXR in the AM    COVID-19 Disease -Currently asymptomatic and chest x-ray and chest CT scan done -Check inflammatory markers and D-dimer was elevated at 5.88 and fibrinogen was 371 -CXR as Above -Check Blood Cx x2 and Procalcitonin -Inflammatory Marker Trend:  COVID-19 Labs  Recent Labs    12/05/21 0501 12/06/21 0547  DDIMER 2.78* 2.64*  FERRITIN 32 35  CRP 1.4* 1.4*    Lab Results  Component Value Date   SARSCOV2NAA POSITIVE (A) 12/01/2021      -SpO2: 100 %; Not requiring any Supplemental O2 via [place]  -Airborne and Contract Precautions -Will place on Combivent 1 puff IH q6h -Started Molnupiravir 800 mg po BID x5 Days; -Antitussives with Robitussin DM and Tussionex  -Started Steroids Solumedrol 0.5 mg/kg x12 hours for 3 days and then 50 mg po daily have now stopped since her CRP is not elevated -Start Zinc and Vitamin C   Hypertensive Urgency -BP was elevated on Admission -C/w Amlodipine 2.5 mg pi Daily and Metoprolol Succinate 50 mg po daily  -Added Hydralazine 10 mg q6hprn for SBP>180 or DBP>100 -Continue to Monitor BP per Protocol -Last BP  reading was elevated  now and is 147/6   Hx of Gastric Ulcer/GERD/GI Prophylaxis -C/w PPI BID   Diabetes Mellitus Type 2 -Hold Home Glipizide -Check HbA1c and was 4.9 -Place on Moderate Novolog SSI AC/HS -Continue to Monitor and Trend Blood Sugars carefully  -CBGs ranging from 106-118   Hypothyroidism -Check TSH was 2.793 -C/w Levothyroxine 25 mcg po Daily   Acute on CKD stage IIIb/Nephrotic syndrome? -HepatoRenal Syndrome Type 2 vs GN vs. Other -Await nephrology recommendation  Metabolic Acidosis  Hyperphosphatemia -6/11 Will await nephrology recommendation on starting binder  Hyponatremia  -6/7 resolved  Normocytic Anemia -Patient has a history of iron deficiency and take iron supplementation but will hold this for now  pending Anemia Panel -Patient's hemoglobin/hematocrit now 10.2/31.3 -> 8.5/25.8 -> 8.4/26.4 -> 8.0/25.3 -> 7.9/24.8 -> 8.9/27.8 -Anemia panel done and showed an iron level of 21, U IBC of 322, TIBC of 343, saturation ratios of 6%, ferritin level 28, folate level of 28.6 and vitamin B12 931 -Continue to monitor for signs and symptoms of bleeding; no overt bleeding noted -Repeat CBC in a.m.   Hypoalbuminemia -Patient's Albumin Level went from 2.5 -> 2.3x2 -> 2.6 -Expected in liver failure   Thrombocytopenia -Mild, negative sign of overt bleeding.  Continue to monitor closely   Goals of care - 6/7 palliative care consult: Multisystem organ failure evaluate for patient CODE STATUS to DNR, given her serious multiple comorbidities patient requires HCPOA     Mobility Assessment (last 72 hours)     Mobility Assessment     Row Name 12/07/21 0715 12/06/21 0720 12/05/21 1915 12/05/21 0730 12/05/21 0700   Does patient have an order for bedrest or is patient medically unstable No - Continue assessment No - Continue assessment No - Continue assessment No - Continue assessment No - Continue assessment   What is the highest level of mobility based on the progressive mobility assessment? Level 5 (Walks with assist in room/hall) - Balance while stepping forward/back and can walk in room with assist - Complete Level 5 (Walks with assist in room/hall) - Balance while stepping forward/back and can walk in room with assist - Complete Level 5 (Walks with assist in room/hall) - Balance while stepping forward/back and can walk in room with assist - Complete Level 5 (Walks with assist in room/hall) - Balance while stepping forward/back and can walk in room with assist - Complete Level 5 (Walks with assist in room/hall) - Balance while stepping forward/back and can walk in room with assist - Complete              Interdisciplinary Goals of Care Family Meeting   Date carried out: 12/07/2021  Location of  the meeting:   Member's involved:   Durable Power of Tour manager:     Discussion: We discussed goals of care for Stacey Chambers .    Code status:   Disposition:   Time spent for the meeting:     Deandrea Vanpelt J, MD  12/07/2021, 4:55 PM         DVT prophylaxis:  Code Status: Full Family Communication:  Status is: Inpatient    Dispo: The patient is from: Home              Anticipated d/c is to: Home              Anticipated d/c date is: > 3 days  Patient currently is not medically stable to d/c.      Consultants:  Nephrology GI  Procedures/Significant Events:    I have personally reviewed and interpreted all radiology studies and my findings are as above.  VENTILATOR SETTINGS:    Cultures   Antimicrobials: Anti-infectives (From admission, onward)    Start     Dose/Rate Route Frequency Ordered Stop   12/01/21 1900  molnupiravir EUA (LAGEVRIO) capsule 800 mg        4 capsule Oral 2 times daily 12/01/21 1546 12/06/21 0904         Devices    LINES / TUBES:      Continuous Infusions:  albumin human       Objective: Vitals:   12/07/21 0531 12/07/21 1158 12/07/21 1427 12/07/21 1444  BP: (!) 169/80 (!) 159/82 (!) 153/66 (!) 143/70  Pulse: 64 65 66 67  Resp: 19 18 18    Temp: 98.5 F (36.9 C) 98.4 F (36.9 C) 98.3 F (36.8 C)   TempSrc: Oral Oral Oral   SpO2: 98% 96% 98% 98%  Weight:      Height:        Intake/Output Summary (Last 24 hours) at 12/07/2021 1655 Last data filed at 12/07/2021 1400 Gross per 24 hour  Intake 360 ml  Output 1350 ml  Net -990 ml   Filed Weights   12/04/21 0500 12/05/21 0500 12/06/21 0500  Weight: 67.5 kg 69.4 kg 68.4 kg    Examination:  General: A/O x4 No acute respiratory distress Eyes: negative scleral hemorrhage, negative anisocoria, negative icterus ENT: Negative Runny nose, negative gingival bleeding, Neck:  Negative scars, masses, torticollis,  lymphadenopathy, JVD Lungs: Clear to auscultation bilaterally without wheezes or crackles Cardiovascular: Regular rate and rhythm without murmur gallop or rub normal S1 and S2 Abdomen: negative abdominal pain, positive distended (ascites), positive soft, bowel sounds, no rebound, no ascites, no appreciable mass Extremities: No significant cyanosis, clubbing, or edema bilateral lower extremities Skin: Negative rashes, lesions, ulcers Psychiatric:  Negative depression, negative anxiety, negative fatigue, negative mania  Central nervous system:  Cranial nerves II through XII intact, tongue/uvula midline, all extremities muscle strength 5/5, sensation intact throughout,  negative dysarthria, negative expressive aphasia, negative receptive aphasia.  .     Data Reviewed: Care during the described time interval was provided by me .  I have reviewed this patient's available data, including medical history, events of note, physical examination, and all test results as part of my evaluation.  CBC: Recent Labs  Lab 12/03/21 0553 12/04/21 0541 12/05/21 0501 12/06/21 0547 12/07/21 0459  WBC 7.0 5.6 4.7 4.7 4.4  NEUTROABS 5.1 4.1 3.1 2.6 2.7  HGB 8.4* 8.0* 7.9* 8.9* 8.8*  HCT 26.4* 25.3* 24.8* 27.8* 27.4*  MCV 97.1 96.9 96.1 95.5 96.5  PLT 113* 103* 93* 114* 322*   Basic Metabolic Panel: Recent Labs  Lab 12/03/21 0553 12/04/21 0541 12/05/21 0501 12/06/21 0547 12/07/21 0459 12/07/21 0747  NA 134* 136 137 134* 138  137  --   K 3.9 4.1 4.0 4.1 4.0  3.9  --   CL 108 112* 111 104 106  105  --   CO2 17* 17* 20* 23 22  22   --   GLUCOSE 99 105* 99 100* 100*  98  --   BUN 61* 66* 59* 60* 59*  59*  --   CREATININE 3.39* 3.46* 3.28* 3.48* 3.46*  3.41*  --   CALCIUM 7.9* 7.7* 7.7* 7.7* 8.1*  8.0*  --  MG 2.2 2.0 2.0 2.0 1.9 2.0  PHOS 4.3 4.1 4.1 4.5 4.8*  4.8* 4.8*   GFR: Estimated Creatinine Clearance: 12.8 mL/min (A) (by C-G formula based on SCr of 3.46 mg/dL (H)). Liver  Function Tests: Recent Labs  Lab 12/03/21 0553 12/04/21 0541 12/05/21 0501 12/06/21 0547 12/07/21 0459  AST 36 40 34 36 31  ALT 20 24 21 21 19   ALKPHOS 107 97 104 113 104  BILITOT 0.6 0.6 0.9 0.7 0.9  PROT 6.5 5.9* 5.9* 6.4* 6.3*  ALBUMIN 2.5* 2.3* 2.3* 2.6* 2.5*  2.4*   Recent Labs  Lab 12/01/21 1212  LIPASE 94*   Recent Labs  Lab 12/01/21 1652  AMMONIA 20   Coagulation Profile: Recent Labs  Lab 12/01/21 1435  INR 1.4*   Cardiac Enzymes: No results for input(s): CKTOTAL, CKMB, CKMBINDEX, TROPONINI in the last 168 hours. BNP (last 3 results) No results for input(s): PROBNP in the last 8760 hours. HbA1C: No results for input(s): HGBA1C in the last 72 hours. CBG: Recent Labs  Lab 12/06/21 2131 12/07/21 0825 12/07/21 1058 12/07/21 1155 12/07/21 1640  GLUCAP 112* 110* 117* 98 117*   Lipid Profile: No results for input(s): CHOL, HDL, LDLCALC, TRIG, CHOLHDL, LDLDIRECT in the last 72 hours. Thyroid Function Tests: No results for input(s): TSH, T4TOTAL, FREET4, T3FREE, THYROIDAB in the last 72 hours. Anemia Panel: Recent Labs    12/05/21 0501 12/06/21 0547  FERRITIN 32 35   Sepsis Labs: Recent Labs  Lab 12/01/21 1704  PROCALCITON <0.10    Recent Results (from the past 240 hour(s))  SARS Coronavirus 2 by RT PCR (hospital order, performed in [facility] hospital lab) *cepheid single result test* Anterior Nasal Swab     Status: Abnormal   Collection Time: 12/01/21 12:12 PM   Specimen: Anterior Nasal Swab  Result Value Ref Range Status   SARS Coronavirus 2 by RT PCR POSITIVE (A) NEGATIVE Final    Comment: (NOTE) SARS-CoV-2 target nucleic acids are DETECTED  SARS-CoV-2 RNA is generally detectable in upper respiratory specimens  during the acute phase of infection.  Positive results are indicative  of the presence of the identified virus, but do not rule out bacterial infection or co-infection with other pathogens not detected by the test.  Clinical  correlation with patient history and  other diagnostic information is necessary to determine patient infection status.  The expected result is negative.  Fact Sheet for Patients:   https://www.patel.info/   Fact Sheet for Healthcare Providers:   https://hall.com/    This test is not yet approved or cleared by the [place] FDA and  has been authorized for detection and/or diagnosis of SARS-CoV-2 by FDA under an Emergency Use Authorization (EUA).  This EUA will remain in effect (meaning this test can be used) for the duration of  the COVID-19 declaration under Section 564(b)(1)  of the Act, 21 U.S.C. section 360-bbb-3(b)(1), unless the authorization is terminated or revoked sooner.   Performed at [facility], [place] [address] [place], [place] [address]   Fungus Culture With Stain     Status: None (Preliminary result)   Collection Time: 12/01/21  4:27 PM   Specimen: Abdomen; Peritoneal Fluid  Result Value Ref Range Status   Fungus Stain Final report  Final    Comment: (NOTE) Performed At: [facility] [place], [place] [id number] Rush Farmer MD [id number]    Fungus (Mycology) Culture PENDING  Incomplete   Fungal Source PERITONEAL  Final  Comment: Performed at [facility], [place] [address] [place], [place] [address]  Acid Fast Smear (AFB)     Status: None   Collection Time: 12/01/21  4:27 PM   Specimen: Abdomen; Peritoneal Fluid  Result Value Ref Range Status   AFB Specimen Processing Concentration  Final   Acid Fast Smear Negative  Final    Comment: (NOTE) Performed At: [facility] [place], [place] [id number] Rush Farmer MD [id number]    Source (AFB) PERITONEAL  Final    Comment: Performed at [facility], [place] [address], [place], [place] [address]  Fungus Culture Result     Status: None   Collection Time:  12/01/21  4:27 PM  Result Value Ref Range Status   Result 1 Comment  Final    Comment: (NOTE) KOH/Calcofluor preparation:  no fungus observed. Performed At: [facility] [place], [place] [id number] Rush Farmer MD [id number]   Culture, blood (Routine X 2) w Reflex to ID Panel     Status: None   Collection Time: 12/01/21  4:52 PM   Specimen: BLOOD  Result Value Ref Range Status   Specimen Description   Final    BLOOD SITE NOT SPECIFIED Performed at [place] [address], [place], [place] [address]    Special Requests   Final    BOTTLES DRAWN AEROBIC AND ANAEROBIC Blood Culture results may not be optimal due to an inadequate volume of blood received in culture bottles Performed at [place] [address] [place], [place] [address]    Culture   Final    NO GROWTH 5 DAYS Performed at [facility], [place] [address] [place], [place] [address]    Report Status 12/06/2021 FINAL  Final  Culture, blood (Routine X 2) w Reflex to ID Panel     Status: None   Collection Time: 12/01/21  8:10 PM   Specimen: BLOOD  Result Value Ref Range Status   Specimen Description   Final    BLOOD BLOOD RIGHT HAND Performed at [place] [address] [place], [place] [address]    Special Requests   Final    IN PEDIATRIC BOTTLE Blood Culture results may not be optimal due to an excessive volume of blood received in culture bottles Performed at [place] [address] [place], [place] [address]    Culture   Final    NO GROWTH 5 DAYS Performed at [facility], [place] [address], [place], [place] [address]    Report Status 12/06/2021 FINAL  Final  Culture, body fluid w Gram Stain-bottle     Status: None   Collection Time: 12/01/21  9:41 PM   Specimen: Peritoneal Washings  Result Value Ref Range Status   Specimen Description PERITONEAL  Final   Special Requests NONE   Final   Culture   Final    NO GROWTH 5 DAYS Performed at [place] [address] [place], [place] [address]    Report Status 12/06/2021 FINAL  Final  Gram stain     Status: None   Collection Time: 12/01/21  9:41 PM   Specimen: Peritoneal Washings  Result Value Ref Range Status   Specimen Description PERITONEAL  Final   Special Requests NONE  Final   Gram Stain   Final    NO WBC SEEN NO ORGANISMS SEEN Performed at [facility], [address] [place], [place] [address]    Report Status 12/01/2021 FINAL  Final         Radiology Studies: DG CHEST PORT 1 VIEW  Result Date: 12/07/2021 CLINICAL DATA:  Shortness of breath.  Coronavirus infection. EXAM: PORTABLE CHEST 1 VIEW COMPARISON:  12/05/2021.  12/04/2021.  12/01/2021. FINDINGS: Apparent enlargement of bilateral pleural effusions, more on the right than the left. Abnormal density the lung that could be due to a combination of edema, atelectasis and pneumonia, worse on the right than the left. IMPRESSION: Most consistent with fluid overload/congestive heart failure. Enlarging effusions right more than left. Mild interstitial edema. Atelectasis related to the effusions. Coexistent pneumonia not excluded. Electronically Signed   By: Nelson Chimes M.D.   On: 12/07/2021 07:40        Scheduled Meds:  amLODipine  2.5 mg Oral Daily   vitamin C  500 mg Oral Daily   docusate sodium  100 mg Oral BID   folic acid  1 mg Oral Daily   furosemide  40 mg Intravenous Once   heparin  5,000 Units Subcutaneous Q8H   insulin aspart  0-15 Units Subcutaneous TID WC   insulin aspart  0-5 Units Subcutaneous QHS   levothyroxine  25 mcg Oral Daily   melatonin  3 mg Oral Once   metoprolol succinate  50 mg Oral Daily   multivitamin with minerals  1 tablet Oral Daily   pantoprazole  40 mg Oral BID   thiamine  100 mg Oral Daily   zinc sulfate  220 mg Oral Daily   Continuous Infusions:  albumin human       LOS: 6 days    Time  spent:40 min    Burnette Sautter, Geraldo Docker, MD [facility]   If 7PM-7AM, please contact night-coverage 12/07/2021, 4:55 PM

## 2021-12-07 NOTE — Progress Notes (Signed)
Patient ID: Stacey Chambers, female   DOB: [birth year], 71 y.o.   MRN: [id number] [facility] Progress Note   Assessment/ Plan:   1. Acute kidney Injury on CKD Stage IV: With baseline creatinine of 2.1 after gradual progression over the past year.  Urine output fair overnight (likely incompletely charted and I will order for daily weights to assess success of diuresis). I will re-order albumin/lasix again today and follow labs. With her renal atrophy and gradual progression of renal dysfunction, I fear that the risks of renal biopsy outweigh the benefit at this time.   2. 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 She had low ceruloplasmin and an elevated anti-smooth muscle antibody with additional testing as planned by GI team.  3. Hypertensive urgency: blood pressures remain intermittently elevated on low dose amlodipine, metoprolol and PRN hydralazine.  4. 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   Subjective:   No acute events overnight and denies chest pain/abdominal pain   Objective:   BP (!) 159/82 (BP Location: Left Arm)   Pulse 65   Temp 98.4 F (36.9 C) (Oral)   Resp 18   Ht 4\' 10"  (1.473 m)   Wt 68.4 kg   SpO2 96%   BMI 31.52 kg/m   Intake/Output Summary (Last 24 hours) at 12/07/2021 1215 Last data filed at 12/07/2021 1100 Gross per 24 hour  Intake 480 ml  Output 1300 ml  Net -820 ml   Weight change:   Physical Exam: STM:[id number] resting in bed QJJ:HERDE regular rhythm and normal rate, normal s1 and s2 Resp: Decreased breath sounds over bases even with deep inspiratory effort. YCX:KGYJ, slightly distended, non-tender, bowel sounds normal Ext: Trace edema over lower back  Imaging: DG CHEST PORT 1 VIEW  Result Date: 12/07/2021 CLINICAL DATA:  Shortness of  breath.  Coronavirus infection. EXAM: PORTABLE CHEST 1 VIEW COMPARISON:  12/05/2021.  12/04/2021.  12/01/2021. FINDINGS: Apparent enlargement of bilateral pleural effusions, more on the right than the left. Abnormal density the lung that could be due to a combination of edema, atelectasis and pneumonia, worse on the right than the left. IMPRESSION: Most consistent with fluid overload/congestive heart failure. Enlarging effusions right more than left. Mild interstitial edema. Atelectasis related to the effusions. Coexistent pneumonia not excluded. Electronically Signed   By: Nelson Chimes M.D.   On: 12/07/2021 07:40    Labs: BMET Recent Labs  Lab 12/01/21 1212 12/02/21 0458 12/03/21 0553 12/04/21 0541 12/05/21 0501 12/06/21 0547 12/07/21 0459 12/07/21 0747  NA 136 135 134* 136 137 134* 138  137  --   K 4.1 4.0 3.9 4.1 4.0 4.1 4.0  3.9  --   CL 109 111 108 112* 111 104 106  105  --   CO2 18* 18* 17* 17* 20* 23 22  22   --   GLUCOSE 125* 140* 99 105* 99 100* 100*  98  --   BUN 46* 55* 61* 66* 59* 60* 59*  59*  --   CREATININE 2.93* 3.21* 3.39* 3.46* 3.28* 3.48* 3.46*  3.41*  --   CALCIUM 8.5* 8.2* 7.9* 7.7* 7.7* 7.7* 8.1*  8.0*  --   PHOS  --  5.1* 4.3 4.1 4.1 4.5 4.8*  4.8* 4.8*   CBC Recent Labs  Lab 12/04/21 0541 12/05/21 0501 12/06/21 0547 12/07/21 0459  WBC 5.6 4.7 4.7 4.4  NEUTROABS 4.1 3.1 2.6 2.7  HGB 8.0* 7.9* 8.9* 8.8*  HCT 25.3* 24.8* 27.8* 27.4*  MCV 96.9 96.1 95.5 96.5  PLT 103* 93* 114* 111*   Medications:     amLODipine  2.5 mg Oral Daily   vitamin C  500 mg Oral Daily   docusate sodium  100 mg Oral BID   folic acid  1 mg Oral Daily   heparin  5,000 Units Subcutaneous Q8H   insulin aspart  0-15 Units Subcutaneous TID WC   insulin aspart  0-5 Units Subcutaneous QHS   levothyroxine  25 mcg Oral Daily   melatonin  3 mg Oral Once   metoprolol succinate  50 mg Oral Daily   multivitamin with minerals  1 tablet Oral Daily   pantoprazole  40 mg Oral BID    thiamine  100 mg Oral Daily   zinc sulfate  220 mg Oral Daily   [name], MD 12/07/2021, 12:15 PM

## 2021-12-08 DIAGNOSIS — N179 Acute kidney failure, unspecified: Secondary | ICD-10-CM | POA: Diagnosis not present

## 2021-12-08 DIAGNOSIS — R188 Other ascites: Secondary | ICD-10-CM | POA: Diagnosis not present

## 2021-12-08 DIAGNOSIS — N189 Chronic kidney disease, unspecified: Secondary | ICD-10-CM | POA: Diagnosis not present

## 2021-12-08 DIAGNOSIS — I10 Essential (primary) hypertension: Secondary | ICD-10-CM | POA: Diagnosis not present

## 2021-12-08 DIAGNOSIS — K7031 Alcoholic cirrhosis of liver with ascites: Secondary | ICD-10-CM | POA: Diagnosis not present

## 2021-12-08 DIAGNOSIS — K7460 Unspecified cirrhosis of liver: Secondary | ICD-10-CM | POA: Diagnosis not present

## 2021-12-08 DIAGNOSIS — E118 Type 2 diabetes mellitus with unspecified complications: Secondary | ICD-10-CM | POA: Diagnosis not present

## 2021-12-08 LAB — CBC WITH DIFFERENTIAL/PLATELET
Abs Immature Granulocytes: 0 10*3/uL (ref 0.00–0.07)
Basophils Absolute: 0 10*3/uL (ref 0.0–0.1)
Basophils Relative: 1 %
Eosinophils Absolute: 0.1 10*3/uL (ref 0.0–0.5)
Eosinophils Relative: 4 %
HCT: 26.3 % — ABNORMAL LOW (ref 36.0–46.0)
Hemoglobin: 8.5 g/dL — ABNORMAL LOW (ref 12.0–15.0)
Immature Granulocytes: 0 %
Lymphocytes Relative: 26 %
Lymphs Abs: 1 10*3/uL (ref 0.7–4.0)
MCH: 31.1 pg (ref 26.0–34.0)
MCHC: 32.3 g/dL (ref 30.0–36.0)
MCV: 96.3 fL (ref 80.0–100.0)
Monocytes Absolute: 0.6 10*3/uL (ref 0.1–1.0)
Monocytes Relative: 15 %
Neutro Abs: 2 10*3/uL (ref 1.7–7.7)
Neutrophils Relative %: 54 %
Platelets: 104 10*3/uL — ABNORMAL LOW (ref 150–400)
RBC: 2.73 MIL/uL — ABNORMAL LOW (ref 3.87–5.11)
RDW: 13.6 % (ref 11.5–15.5)
WBC: 3.7 10*3/uL — ABNORMAL LOW (ref 4.0–10.5)
nRBC: 0 % (ref 0.0–0.2)

## 2021-12-08 LAB — COMPREHENSIVE METABOLIC PANEL
ALT: 18 U/L (ref 0–44)
AST: 30 U/L (ref 15–41)
Albumin: 2.7 g/dL — ABNORMAL LOW (ref 3.5–5.0)
Alkaline Phosphatase: 95 U/L (ref 38–126)
Anion gap: 10 (ref 5–15)
BUN: 58 mg/dL — ABNORMAL HIGH (ref 8–23)
CO2: 24 mmol/L (ref 22–32)
Calcium: 8.3 mg/dL — ABNORMAL LOW (ref 8.9–10.3)
Chloride: 104 mmol/L (ref 98–111)
Creatinine, Ser: 3.8 mg/dL — ABNORMAL HIGH (ref 0.44–1.00)
GFR, Estimated: 12 mL/min — ABNORMAL LOW (ref >60)
Glucose, Bld: 93 mg/dL (ref 70–99)
Potassium: 3.7 mmol/L (ref 3.5–5.1)
Sodium: 138 mmol/L (ref 135–145)
Total Bilirubin: 0.8 mg/dL (ref 0.3–1.2)
Total Protein: 6.3 g/dL — ABNORMAL LOW (ref 6.5–8.1)

## 2021-12-08 LAB — GLUCOSE, CAPILLARY
Glucose-Capillary: 95 mg/dL (ref 70–99)
Glucose-Capillary: 101 mg/dL — ABNORMAL HIGH (ref 70–99)
Glucose-Capillary: 145 mg/dL — ABNORMAL HIGH (ref 70–99)
Glucose-Capillary: 135 mg/dL — ABNORMAL HIGH (ref 70–99)

## 2021-12-08 LAB — PHOSPHORUS: Phosphorus: 4.8 mg/dL — ABNORMAL HIGH (ref 2.5–4.6)

## 2021-12-08 LAB — PROTIME-INR
INR: 1.4 — ABNORMAL HIGH (ref 0.8–1.2)
Prothrombin Time: 17.4 seconds — ABNORMAL HIGH (ref 11.4–15.2)

## 2021-12-08 LAB — IGG: IgG (Immunoglobin G), Serum: 1508 mg/dL (ref 586–1602)

## 2021-12-08 LAB — MAGNESIUM: Magnesium: 1.9 mg/dL (ref 1.7–2.4)

## 2021-12-08 LAB — AMMONIA: Ammonia: 44 umol/L — ABNORMAL HIGH (ref 9–35)

## 2021-12-08 LAB — COPPER, SERUM: Copper: 70 ug/dL — ABNORMAL LOW (ref 80–158)

## 2021-12-08 MED ORDER — HEPARIN SODIUM (PORCINE) 5000 UNIT/ML IJ SOLN
5000.0000 [IU] | Freq: Three times a day (TID) | INTRAMUSCULAR | Status: DC
Start: 2021-12-09 — End: 2021-12-09

## 2021-12-08 MED ORDER — AMLODIPINE BESYLATE 5 MG PO TABS: 5.0000 mg | ORAL_TABLET | Freq: Every day | ORAL | Status: DC

## 2021-12-08 NOTE — Progress Notes (Addendum)
[facility] Progress Note  CC:  Cirrhosis, newly diagnosed   Subjective: She has mild nausea after taking her morning medications and when drinking water.  No vomiting.  No abdominal pain.  No chest pain or shortness of breath.  She passed a brown bowel movement earlier this morning.  No rectal bleeding or black stools.  She reported drinking 2-3 beers approximately 3 to 4 days weekly.  She sometimes drinks more when she feels depressed.  [employer] [name] interpreter utilized to communicate with the patient this morning.   Objective:  Vital signs in last 24 hours: Temp:  [97.5 F (36.4 C)-98.6 F (37 C)] 98.6 F (37 C) (06/08 0556) Pulse Rate:  [65-73] 68 (06/08 0556) Resp:  [16-18] 16 (06/08 0556) BP: (143-163)/(66-82) 160/78 (06/08 0556) SpO2:  [96 %-98 %] 98 % (06/08 0556) Weight:  [62.5 kg] 62.5 kg (06/08 0500) Last BM Date : 12/06/21 General: 71 year old female ambulating up in the room in no acute distress. Eyes: No scleral icterus. Heart: Regular rate and rhythm.  Soft systolic murmur Pulm: Breath sounds clear throughout. Abdomen: Soft, nontender.  A small amount of ascites with lower abdominal wall anasarca.  Positive bowel sounds to all 4 quadrants.  No palpable mass. Extremities: No edema. Neurologic:  Alert and  oriented x 4.  Speech is clear.  Answers questions appropriately.  No asterixis. Psych:  Alert and cooperative. Normal mood and affect.  Intake/Output from previous day: 06/07 0701 - 06/08 0700 In: 120 [P.O.:120] Out: 950 [Urine:950] Intake/Output this shift: No intake/output data recorded.  Lab Results: Recent Labs    12/06/21 0547 12/07/21 0459 12/08/21 0538  WBC 4.7 4.4 3.7*  HGB 8.9* 8.8* 8.5*  HCT 27.8* 27.4* 26.3*  PLT 114* 111* 104*   BMET Recent Labs    12/06/21 0547 12/07/21 0459 12/08/21 0538  NA 134* 138  137 138  K 4.1 4.0  3.9 3.7  CL 104 106  105 104  CO2 '23 22  22 24  ' GLUCOSE 100* 100*  98 93  BUN 60*  59*  59* 58*  CREATININE 3.48* 3.46*  3.41* 3.80*  CALCIUM 7.7* 8.1*  8.0* 8.3*   LFT Recent Labs    12/08/21 0538  PROT 6.3*  ALBUMIN 2.7*  AST 30  ALT 18  ALKPHOS 95  BILITOT 0.8   PT/INR No results for input(s): "LABPROT", "INR" in the last 72 hours. Hepatitis Panel No results for input(s): "HEPBSAG", "HCVAB", "HEPAIGM", "HEPBIGM" in the last 72 hours.  DG CHEST PORT 1 VIEW  Result Date: 12/07/2021 CLINICAL DATA:  Shortness of breath.  Coronavirus infection. EXAM: PORTABLE CHEST 1 VIEW COMPARISON:  12/05/2021.  12/04/2021.  12/01/2021. FINDINGS: Apparent enlargement of bilateral pleural effusions, more on the right than the left. Abnormal density the lung that could be due to a combination of edema, atelectasis and pneumonia, worse on the right than the left. IMPRESSION: Most consistent with fluid overload/congestive heart failure. Enlarging effusions right more than left. Mild interstitial edema. Atelectasis related to the effusions. Coexistent pneumonia not excluded. Electronically Signed   By: Nelson Chimes M.D.   On: 12/07/2021 07:40    Assessment / Plan:  69) 71 year old female with newly diagnosed cirrhosis with ascites, etiology unclear.  Query alcohol associated cirrhosis. S/P diagnostic paracentesis 6/1, 180cc peritoneal fluid removed. No SBP. SAAG > 1.1 consistent with portal hypertension/cirrhosis. ECHO with normal LV EF and RV function. Urine protein > 300. No focal lesion on unenhanced CT scan.  Normal AFP. Negative HBsAg, HCV ab, ANA, AMA and A-1 antitrypsin. No evidence for hemochromatosis. Ceruloplasmin low at 16.4. Serum copper level pending. ASMA elevated at 52. Negative ANA. IgG pending.  Total bili 0.8.  Alk phos 95.  AST 30.  ALT 18.  No evidence of hepatic encephalopathy.  Ammonia 44.  Received albumin IV x 1.  Received furosemide 40 mg IV x1 on 12/07/2021. -INR/PT. Antimicrosomal Ab -LK. -Calculate MELD score after PT/INR completed  -Eventual 24 hr urine copper to  rule out Wilson's disease  -Possible transjugular liver biopsy during this admission -2gm low sodium diet  -No alcohol  -Await  further recommendations per Dr. Candis Schatz   2) AKI, Cr 3.46 -> today Cr 3.80. Unlikely HRS with hypertension. Urine protein > 300, ? Nephrotic syndrome.  -Defer diuretic regimen to nephrology  3) Covid 19 positive, asymptomatic. Chest xray showed fluid overload/CHF with bilateral pleural effusions, R > L with mild interstitial edema.  4) IDA. Hg 8.8 -> 8.5. No overt GI bleeding. EGD 09/12/2021 showed a nonbleeding gastric ulcer.  No varices. Colonoscopy 09/12/2021 resulted in a poop prep, showed diverticulosis and hemorrhoids. Repeat colonoscopy in 6 to 12 months. Continue Pantoprazole 40 mg p.o. twice daily  5) Thrombocytopenia secondary to cirrhosis/mild splenomegaly. PLT 104.   6) Coagulopathy secondary to liver cirrhosis. INR 1.4 on 12/01/2021.  -PT/INR as ordered above  7) Probable small stone in the region of the gallbladder neck per CT. RUQ sono noted a contracted gallbladder with sludge, possible gallbladder wall thickening without gallstones  8) Hypertensive urgency     Principal Problem:   Cirrhosis ([place]) Active Problems:   History of gastric ulcer   History of thrombocytopenia   History of iron deficiency anemia   Gastric ulcer   Dyspnea on exertion   Swelling of lower extremity   Acute kidney injury superimposed on CKD ([place])   Diabetes mellitus type 2 with complications ([place])     LOS: 7 days   Noralyn Pick  12/08/2021, 10:00AM  I have taken a history, reviewed the chart and examined the patient. I performed a substantive portion of this encounter, including complete performance of at least one of the key components, in conjunction with the APP. I agree with the APP's note, impression and recommendations  71 year old female admitted with ascites and lower extremity edema, found to have cirrhosis and acute on chronic renal injury.   Her renal function has not responded to volume replacement.  She has proteinuria and pre-existing kidney disease.    Her presentation is somewhat atypical for hepatorenal syndrome given her relatively mild cirrhosis (normal bilirubin, very mild coagulopathy, no varices, mild portal hypertension based on splenomegaly and borderline thrombocytopenia) as well as her elevated blood pressures in the presence of proteinuria.  Her cirrhosis was initially thought to be most likely secondary to alcohol and NAFLD, but her antismooth muscle antibodies were positive, raising the suspicion for autoimmune hepatitis.  We will plan for a liver biopsy tomorrow if interventional radiology is able which will help [place] determine whether she has autoimmune hepatitis or not.  Defer diuretic management to nephrology, but would consider increasing IV albumin with scheduled doses.  The patient was again very tearful today during my encounter.  I asked if she would like to speak with the chaplain and she said that she would.  I asked the nurse to attack the chaplain to see if he could provide some support.  Michaline Kindig E. Candis Schatz, MD [facility]

## 2021-12-08 NOTE — Progress Notes (Signed)
Brief Pharmacy Note  Per consult, heparin on hold tomorrow, 6/9, for 0600 and 1400 doses. Patient to receive heparin at 2200 tonight and will resume at 2200 tomorrow. Should there be any need to hold heparin further than 1400 tomorrow, please modify order or notify Pharmacy.  Tawnya Crook, PharmD, BCPS Clinical Pharmacist 12/08/2021 5:36 PM

## 2021-12-08 NOTE — Progress Notes (Signed)
PROGRESS NOTE    Stacey Chambers  VXB:[id number] DOB: May 10, 1951 DOA: 12/01/2021 PCP: Trey Sailors, PA     Brief Narrative:  71 y.o. HF ([name] speaker) PMHx chronic iron deficiency anemia, diabetes mellitus type 2, hypertension, hyperlipidemia, hypothyroidism, history of CKD stage IIIb as well as other comorbidities who presents with worsening lower extremity swelling and elevated blood pressure.  Patient states that she started feeling fatigued and tired since Friday of last week is noticed that her legs started having worsening swelling.  She states that whenever she ambulates she gets very dyspneic on exertion and very fatigued.  States that she denies any abdominal pain and denies any cough, chest pain or lightheadedness or dizziness.  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  She had lower extremity swelling and given her dyspnea on exertion we will obtain an echocardiogram and evaluate for heart failure.  She is also noted to have AKI on CKD stage IIIb.  Incidentally she was found to be COVID-positive.  Gastroenterology was consulted and [facility] was asked to admit this patient for lower extremity swelling.  Interventional radiology was consulted for a paracentesis and this has been done and she underwent a diagnostic tap.   **Interim History  Her edema has improved however renal function is worsened.  She is not complaining of any chest pain or shortness of breath.  Abdomen is doing okay today.  She has been consulted and doing further work-up and nephrology is also been consulted given her worsening renal function   Nephrology has placed the patient on low-dose IV fluids at 65 MLS per hour but now have reduced it to 40 mL/hour now that creatinine is leveling off.  Chest x-ray done today and "New symmetric airspace disease, most  likely due to pulmonary edema. Suspect small layering bilateral pleural effusions" to keep a close eye on this.   Nephrology evaluated and given the emergence of vascular congestion on chest x-ray there is discontinued IV fluids at this time and agreed to give her a trial of albumin and Lasix now to help augment diuresis.  After the Lasix she felt a little dizzy though   Subjective: 6/8 afebrile overnight A/O x4.  Negative CP, negative SOB.  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 Candis Schatz GI plan is for A.m. biopsy of liver - Hx EtOH  -Check echocardiogram and showed EF of 60-65% and G2DD -GIs been consulted for further evaluation recommendations and recommending starting 2 g sodium diet and check an AFP as well as checking an ANA, mitochondrial antibody, smooth muscle antibody, ceruloplasmin and alpha-1 antitrypsin; ASMA is pending -Does not need an EGD as this was recently done as outlined but she had no varices -GI  feels that she would benefit from low-dose diuretics however they are hesitant to initiate given her worsening renal status and significant CKD -She does have a history of alcoholism and use in the past years but GI feels that 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 but they  are ruling out other etiologies for her cirrhosis with ascites; her alpha-1 antitrypsin level is within normal limits and rest of the autoimmune work-up is still pending -Ceruloplasmin was 16.4 and on the lower side -Further Care per GI and she has had no evidence for hemochromatosis and her [place] screening showed a nonfocal lesion on unenhanced CT scan with no reported liver lesion ultrasound and normal AFP she had no esophageal varices back in March 2023 -GI recommending 2 g sodium restricted diet and diuretics when renal function allows but now IV fluids are stopped and she is getting albumin and Lasix per nephrology  Dyspnea on Exertion r/o CHF (Unclear type) -6/8 resolved  Diastolic CHF -On admission BNP on admission 715 -Strict in and out - Daily weight  -Placed on Combivent 1 puff IH every 6 scheduled -Check ECHOCardiogram and showed G2DD and EF was 60-65% -Received IV Lasix 20 mg x2 and now on IVF with this is now stopping and getting a dose of albumin and Lasix -D-Dimer Elevated but likely in the setting of COVID and is now trending upwards -May consider V/Q Scan given D-Dimer being elevated -D/G Chest X-ray yesterday showed "Mild interstitial edema. Mild patchy consolidation of right lung base, superimposed pneumonia is not excluded." -SpO2: 97 % -We will consider getting a VQ scan if continues to worsen -Repeat CXR in the AM    COVID-19 Disease -Currently asymptomatic  COVID-19 Labs  Recent Labs    12/06/21 0547  DDIMER 2.64*  FERRITIN 35  CRP 1.4*           Lab Results  Component Value Date   SARSCOV2NAA POSITIVE (A) 12/01/2021      -SpO2: 100 %; Not requiring any Supplemental O2 via [place]  -Airborne and Contract Precautions -Will place on Combivent 1 puff IH q6h -Started Molnupiravir 800 mg po BID x5 Days; -Antitussives with Robitussin DM and Tussionex  -Started Steroids Solumedrol 0.5 mg/kg x12 hours for 3 days and then 50 mg po daily have now stopped  since her CRP is not elevated -Start Zinc and Vitamin C   Hypertensive Urgency -BP was elevated on Admission -C/w Amlodipine 2.5 mg pi Daily and Metoprolol Succinate 50 mg po daily  -Added Hydralazine 10 mg q6hprn for SBP>180 or DBP>100 -Continue to Monitor BP per Protocol -Last BP reading was elevated  now and is 147/6   Hx of Gastric Ulcer/GERD/GI Prophylaxis -C/w PPI BID   Diabetes Mellitus Type 2 -Hold Home Glipizide -Check HbA1c and was 4.9 -Place on Moderate Novolog SSI AC/HS -Continue to Monitor and Trend Blood Sugars carefully  -CBGs ranging from 106-118   Hypothyroidism -Check TSH was 2.793 -C/w Levothyroxine 25 mcg po Daily   Acute on CKD stage IIIb/Nephrotic syndrome? -HepatoRenal Syndrome Type 2 vs GN vs. Other -Await nephrology recommendation  Metabolic Acidosis  Hyperphosphatemia -6/11 Will await nephrology recommendation on starting binder  Hyponatremia  -6/7 resolved  Normocytic Anemia -Patient has a history of iron deficiency and take iron supplementation but will hold this for now pending Anemia Panel -Patient's hemoglobin/hematocrit now 10.2/31.3 -> 8.5/25.8 -> 8.4/26.4 -> 8.0/25.3 -> 7.9/24.8 -> 8.9/27.8 -Anemia panel done and showed an iron level of 21, U IBC of 322,  TIBC of 343, saturation ratios of 6%, ferritin level 28, folate level of 28.6 and vitamin B12 931 -Continue to monitor for signs and symptoms of bleeding; no overt bleeding noted -Repeat CBC in a.m.   Hypoalbuminemia -Patient's Albumin Level went from 2.5 -> 2.3x2 -> 2.6 -Expected in liver failure   Thrombocytopenia -Mild, negative sign of overt bleeding.  Continue to monitor closely   Goals of care - 6/7 palliative care consult: Multisystem organ failure evaluate for patient CODE STATUS to DNR, given her serious multiple comorbidities patient requires HCPOA     Mobility Assessment (last 72 hours)     Mobility Assessment     Row Name 12/07/21 0715 12/06/21 0720 12/05/21  1915       Does patient have an order for bedrest or is patient medically unstable No - Continue assessment No - Continue assessment No - Continue assessment     What is the highest level of mobility based on the progressive mobility assessment? Level 5 (Walks with assist in room/hall) - Balance while stepping forward/back and can walk in room with assist - Complete Level 5 (Walks with assist in room/hall) - Balance while stepping forward/back and can walk in room with assist - Complete Level 5 (Walks with assist in room/hall) - Balance while stepping forward/back and can walk in room with assist - Complete                Interdisciplinary Goals of Care Family Meeting   Date carried out: 12/08/2021  Location of the meeting:   Member's involved:   Durable Power of Tour manager:     Discussion: We discussed goals of care for Stacey Chambers .    Code status:   Disposition:   Time spent for the meeting:     Ra Pfiester J, MD  12/08/2021, 11:32 AM         DVT prophylaxis:  Code Status: Full Family Communication:  Status is: Inpatient    Dispo: The patient is from: Home              Anticipated d/c is to: Home              Anticipated d/c date is: > 3 days              Patient currently is not medically stable to d/c.      Consultants:  Nephrology GI  Procedures/Significant Events:    I have personally reviewed and interpreted all radiology studies and my findings are as above.  VENTILATOR SETTINGS:    Cultures   Antimicrobials: Anti-infectives (From admission, onward)    Start     Ordered Stop   12/01/21 1900  molnupiravir EUA (LAGEVRIO) capsule 800 mg        12/01/21 1546 12/06/21 0904         Devices    LINES / TUBES:      Continuous Infusions:     Objective: Vitals:   12/07/21 1444 12/07/21 1943 12/08/21 0500 12/08/21 0556  BP: (!) 143/70 (!) 163/82  (!) 160/78  Pulse: 67 73  68   Resp:  16  16  Temp:  (!) 97.5 F (36.4 C)  98.6 F (37 C)  TempSrc:  Oral  Oral  SpO2: 98% 98%  98%  Weight:   62.5 kg   Height:        Intake/Output Summary (Last 24 hours) at 12/08/2021 1132 Last data filed at 12/07/2021 1800  Gross per 24 hour  Intake 120 ml  Output 650 ml  Net -530 ml    Filed Weights   12/05/21 0500 12/06/21 0500 12/08/21 0500  Weight: 69.4 kg 68.4 kg 62.5 kg    Examination:  General: A/O x4 No acute respiratory distress Eyes: negative scleral hemorrhage, negative anisocoria, negative icterus ENT: Negative Runny nose, negative gingival bleeding, Neck:  Negative scars, masses, torticollis, lymphadenopathy, JVD Lungs: Clear to auscultation bilaterally without wheezes or crackles Cardiovascular: Regular rate and rhythm without murmur gallop or rub normal S1 and S2 Abdomen: negative abdominal pain, positive distended (ascites), positive soft, bowel sounds, no rebound, no ascites, no appreciable mass Extremities: No significant cyanosis, clubbing, or edema bilateral lower extremities Skin: Negative rashes, lesions, ulcers Psychiatric:  Negative depression, negative anxiety, negative fatigue, negative mania  Central nervous system:  Cranial nerves II through XII intact, tongue/uvula midline, all extremities muscle strength 5/5, sensation intact throughout,  negative dysarthria, negative expressive aphasia, negative receptive aphasia.  .     Data Reviewed: Care during the described time interval was provided by me .  I have reviewed this patient's available data, including medical history, events of note, physical examination, and all test results as part of my evaluation.  CBC: Recent Labs  Lab 12/04/21 0541 12/05/21 0501 12/06/21 0547 12/07/21 0459 12/08/21 0538  WBC 5.6 4.7 4.7 4.4 3.7*  NEUTROABS 4.1 3.1 2.6 2.7 2.0  HGB 8.0* 7.9* 8.9* 8.8* 8.5*  HCT 25.3* 24.8* 27.8* 27.4* 26.3*  MCV 96.9 96.1 95.5 96.5 96.3  PLT 103* 93* 114* 111* 104*     Basic Metabolic Panel: Recent Labs  Lab 12/04/21 0541 12/05/21 0501 12/06/21 0547 12/07/21 0459 12/07/21 0747 12/08/21 0538  NA 136 137 134* 138  137  --  138  K 4.1 4.0 4.1 4.0  3.9  --  3.7  CL 112* 111 104 106  105  --  104  CO2 17* 20* 23 22  22   --  24  GLUCOSE 105* 99 100* 100*  98  --  93  BUN 66* 59* 60* 59*  59*  --  58*  CREATININE 3.46* 3.28* 3.48* 3.46*  3.41*  --  3.80*  CALCIUM 7.7* 7.7* 7.7* 8.1*  8.0*  --  8.3*  MG 2.0 2.0 2.0 1.9 2.0 1.9  PHOS 4.1 4.1 4.5 4.8*  4.8* 4.8* 4.8*    GFR: Estimated Creatinine Clearance: 11.1 mL/min (A) (by C-G formula based on SCr of 3.8 mg/dL (H)). Liver Function Tests: Recent Labs  Lab 12/04/21 0541 12/05/21 0501 12/06/21 0547 12/07/21 0459 12/08/21 0538  AST 40 34 36 31 30  ALT 24 21 21 19 18   ALKPHOS 97 104 113 104 95  BILITOT 0.6 0.9 0.7 0.9 0.8  PROT 5.9* 5.9* 6.4* 6.3* 6.3*  ALBUMIN 2.3* 2.3* 2.6* 2.5*  2.4* 2.7*    Recent Labs  Lab 12/01/21 1212  LIPASE 94*    Recent Labs  Lab 12/01/21 1652 12/08/21 0538  AMMONIA 20 44*    Coagulation Profile: Recent Labs  Lab 12/01/21 1435  INR 1.4*    Cardiac Enzymes: No results for input(s): "CKTOTAL", "CKMB", "CKMBINDEX", "TROPONINI" in the last 168 hours. BNP (last 3 results) No results for input(s): "PROBNP" in the last 8760 hours. HbA1C: No results for input(s): "HGBA1C" in the last 72 hours. CBG: Recent Labs  Lab 12/07/21 1058 12/07/21 1155 12/07/21 1640 12/07/21 2119 12/08/21 0801  GLUCAP 117* 98 117* 111* 95    Lipid Profile: No  results for input(s): "CHOL", "HDL", "LDLCALC", "TRIG", "CHOLHDL", "LDLDIRECT" in the last 72 hours. Thyroid Function Tests: No results for input(s): "TSH", "T4TOTAL", "FREET4", "T3FREE", "THYROIDAB" in the last 72 hours. Anemia Panel: Recent Labs    12/06/21 0547  FERRITIN 35    Sepsis Labs: Recent Labs  Lab 12/01/21 1704  PROCALCITON <0.10     Recent Results (from the past 240  hour(s))  SARS Coronavirus 2 by RT PCR (hospital order, performed in [facility] hospital lab) *cepheid single result test* Anterior Nasal Swab     Status: Abnormal   Collection Time: 12/01/21 12:12 PM   Specimen: Anterior Nasal Swab  Result Value Ref Range Status   SARS Coronavirus 2 by RT PCR POSITIVE (A) NEGATIVE Final    Comment: (NOTE) SARS-CoV-2 target nucleic acids are DETECTED  SARS-CoV-2 RNA is generally detectable in upper respiratory specimens  during the acute phase of infection.  Positive results are indicative  of the presence of the identified virus, but do not rule out bacterial infection or co-infection with other pathogens not detected by the test.  Clinical correlation with patient history and  other diagnostic information is necessary to determine patient infection status.  The expected result is negative.  Fact Sheet for Patients:   https://www.patel.info/   Fact Sheet for Healthcare Providers:   https://hall.com/    This test is not yet approved or cleared by the [place] FDA and  has been authorized for detection and/or diagnosis of SARS-CoV-2 by FDA under an Emergency Use Authorization (EUA).  This EUA will remain in effect (meaning this test can be used) for the duration of  the COVID-19 declaration under Section 564(b)(1)  of the Act, 21 U.S.C. section 360-bbb-3(b)(1), unless the authorization is terminated or revoked sooner.   Performed at [facility], [place] [address], [place], [place] [address]   Fungus Culture With Stain     Status: None (Preliminary result)   Collection Time: 12/01/21  4:27 PM   Specimen: Abdomen; Peritoneal Fluid  Result Value Ref Range Status   Fungus Stain Final report  Final    Comment: (NOTE) Performed At: [facility] [address] [place], [place] [id number] Rush Farmer MD [id number]    Fungus (Mycology) Culture PENDING  Incomplete    Fungal Source PERITONEAL  Final    Comment: Performed at [facility], [place] [address] [place], [place] [address]  Acid Fast Smear (AFB)     Status: None   Collection Time: 12/01/21  4:27 PM   Specimen: Abdomen; Peritoneal Fluid  Result Value Ref Range Status   AFB Specimen Processing Concentration  Final   Acid Fast Smear Negative  Final    Comment: (NOTE) Performed At: [facility] [place], [place] [id number] Rush Farmer MD [id number]    Source (AFB) PERITONEAL  Final    Comment: Performed at [facility], [place] [address], [place], [place] [address]  Fungus Culture Result     Status: None   Collection Time: 12/01/21  4:27 PM  Result Value Ref Range Status   Result 1 Comment  Final    Comment: (NOTE) KOH/Calcofluor preparation:  no fungus observed. Performed At: [facility] [place], [place] [id number] Rush Farmer MD [id number]   Culture, blood (Routine X 2) w Reflex to ID Panel     Status: None   Collection Time: 12/01/21  4:52 PM   Specimen: BLOOD  Result Value Ref Range Status  Specimen Description   Final    BLOOD SITE NOT SPECIFIED Performed at [place] [address], [place], [place] [address]    Special Requests   Final    BOTTLES DRAWN AEROBIC AND ANAEROBIC Blood Culture results may not be optimal due to an inadequate volume of blood received in culture bottles Performed at [place] [address] [place], [place] [address]    Culture   Final    NO GROWTH 5 DAYS Performed at [facility], [place] [address] [place], [place] [address]    Report Status 12/06/2021 FINAL  Final  Culture, blood (Routine X 2) w Reflex to ID Panel     Status: None   Collection Time: 12/01/21  8:10 PM   Specimen: BLOOD  Result Value Ref Range Status   Specimen Description   Final    BLOOD BLOOD RIGHT HAND Performed at [place] [address] [place], [place] [address]    Special Requests   Final    IN PEDIATRIC BOTTLE Blood Culture results may not be optimal due to an excessive volume of blood received in culture bottles Performed at [place] [address] [place], [place] [address]    Culture   Final    NO GROWTH 5 DAYS Performed at [facility], [place] [address] [place], [place] [address]    Report Status 12/06/2021 FINAL  Final  Culture, body fluid w Gram Stain-bottle     Status: None   Collection Time: 12/01/21  9:41 PM   Specimen: Peritoneal Washings  Result Value Ref Range Status   Specimen Description PERITONEAL  Final   Special Requests NONE  Final   Culture   Final    NO GROWTH 5 DAYS Performed at [place] [address] [place], [place] [address]    Report Status 12/06/2021 FINAL  Final  Gram stain     Status: None   Collection Time: 12/01/21  9:41 PM   Specimen: Peritoneal Washings  Result Value Ref Range Status   Specimen Description PERITONEAL  Final   Special Requests NONE  Final   Gram Stain   Final    NO WBC SEEN NO ORGANISMS SEEN Performed at [facility], [address] [place], [place] [address]    Report Status 12/01/2021 FINAL  Final         Radiology Studies: DG CHEST PORT 1 VIEW  Result Date: 12/07/2021 CLINICAL DATA:  Shortness of breath.  Coronavirus infection. EXAM: PORTABLE CHEST 1 VIEW COMPARISON:  12/05/2021.  12/04/2021.  12/01/2021. FINDINGS: Apparent enlargement of bilateral pleural effusions, more on the right than the left. Abnormal density the lung that could be due to a combination of edema, atelectasis and pneumonia, worse on the right than the left. IMPRESSION: Most consistent with fluid overload/congestive heart failure. Enlarging effusions right more than left. Mild interstitial edema. Atelectasis related to the effusions. Coexistent pneumonia not excluded. Electronically Signed   By:  Nelson Chimes M.D.   On: 12/07/2021 07:40        Scheduled Meds:  amLODipine  2.5 mg Oral Daily   vitamin C  500 mg Oral Daily   docusate sodium  100 mg Oral BID   folic acid  1 mg Oral Daily   heparin  5,000 Units Subcutaneous Q8H   insulin aspart  0-15 Units Subcutaneous TID WC   insulin aspart  0-5 Units Subcutaneous  QHS   levothyroxine  25 mcg Oral Daily   melatonin  3 mg Oral Once   metoprolol succinate  50 mg Oral Daily   multivitamin with minerals  1 tablet Oral Daily   pantoprazole  40 mg Oral BID   thiamine  100 mg Oral Daily   zinc sulfate  220 mg Oral Daily   Continuous Infusions:     LOS: 7 days    Time spent:40 min    Elaya Droege, Geraldo Docker, MD [facility]   If 7PM-7AM, please contact night-coverage 12/08/2021, 11:32 AM

## 2021-12-08 NOTE — Progress Notes (Signed)
Chaplain acknowledges consult and spoke with patient's nurse who stated that the patient had been upset earlier in the day, but was in better spirits now. Chaplain will check back in tomorrow during the day to see how patient is doing.  [address], [place] Pager, [phone]

## 2021-12-08 NOTE — Progress Notes (Signed)
3:30pm-7pm Assessment findings unchanged from 7am-3pm shift.  Ivan Anchors, RN 12/08/21 7:39 PM

## 2021-12-08 NOTE — Progress Notes (Addendum)
Patient ID: Stacey Chambers, female   DOB: 1950/12/24, 71 y.o.   MRN: [id number] [facility] Progress Note   Assessment/ Plan:   1. Acute kidney Injury on CKD Stage IV: With baseline creatinine of 2.1 after gradual progression over the past year. Renal function worse overnight likely from diuretic use and possibly indicates mild intravascular volume contraction/decreased effective arterial volume with HRS physiology. Will hold off on diuretics today and monitor labs tomorrow. I fear she will have progressive worsening of kidney disease and likely require renal replacement therapy in the future.  2. 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 She had low ceruloplasmin and an elevated anti-smooth muscle antibody with additional testing as planned by GI team.  3. Hypertensive urgency: blood pressures remain intermittently elevated on low dose amlodipine, metoprolol and PRN hydralazine. Increase amlodipine dose and will decide ability to start low dose aldactone.  4. 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   Subjective:   No acute events overnight and feels as if abdominal distension is notably better. Complains of left knee pain.    Objective:   BP (!) 160/78 (BP Location: Left Arm)   Pulse 68   Temp 98.6 F (37 C) (Oral)   Resp 16   Ht 4\' 10"  (1.473 m)   Wt 62.5 kg   SpO2 98%   BMI 28.80 kg/m   Intake/Output Summary (Last 24 hours) at 12/08/2021 1130 Last data filed at 12/07/2021 1800 Gross per 24 hour  Intake 120 ml  Output 650 ml  Net -530 ml   Weight change:   Physical Exam: XQJ:[id number] resting in bed YJE:HUDJS regular rhythm and normal rate, normal s1 and s2 Resp: Decreased breath sounds over bases even with deep inspiratory effort. HFW:YOVZ, slightly distended,  non-tender, bowel sounds normal Ext: No lower extremity edema  Imaging: DG CHEST PORT 1 VIEW  Result Date: 12/07/2021 CLINICAL DATA:  Shortness of breath.  Coronavirus infection. EXAM: PORTABLE CHEST 1 VIEW COMPARISON:  12/05/2021.  12/04/2021.  12/01/2021. FINDINGS: Apparent enlargement of bilateral pleural effusions, more on the right than the left. Abnormal density the lung that could be due to a combination of edema, atelectasis and pneumonia, worse on the right than the left. IMPRESSION: Most consistent with fluid overload/congestive heart failure. Enlarging effusions right more than left. Mild interstitial edema. Atelectasis related to the effusions. Coexistent pneumonia not excluded. Electronically Signed   By: Nelson Chimes M.D.   On: 12/07/2021 07:40    Labs: BMET Recent Labs  Lab 12/02/21 0458 12/03/21 0553 12/04/21 0541 12/05/21 0501 12/06/21 0547 12/07/21 0459 12/07/21 0747 12/08/21 0538  NA 135 134* 136 137 134* 138  137  --  138  K 4.0 3.9 4.1 4.0 4.1 4.0  3.9  --  3.7  CL 111 108 112* 111 104 106  105  --  104  CO2 18* 17* 17* 20* 23 22  22   --  24  GLUCOSE 140* 99 105* 99 100* 100*  98  --  93  BUN 55* 61* 66* 59* 60* 59*  59*  --  58*  CREATININE 3.21* 3.39* 3.46* 3.28* 3.48* 3.46*  3.41*  --  3.80*  CALCIUM 8.2* 7.9* 7.7* 7.7* 7.7* 8.1*  8.0*  --  8.3*  PHOS 5.1* 4.3 4.1 4.1 4.5 4.8*  4.8* 4.8* 4.8*   CBC Recent Labs  Lab  12/05/21 0501 12/06/21 0547 12/07/21 0459 12/08/21 0538  WBC 4.7 4.7 4.4 3.7*  NEUTROABS 3.1 2.6 2.7 2.0  HGB 7.9* 8.9* 8.8* 8.5*  HCT 24.8* 27.8* 27.4* 26.3*  MCV 96.1 95.5 96.5 96.3  PLT 93* 114* 111* 104*   Medications:     amLODipine  2.5 mg Oral Daily   vitamin C  500 mg Oral Daily   docusate sodium  100 mg Oral BID   folic acid  1 mg Oral Daily   heparin  5,000 Units Subcutaneous Q8H   insulin aspart  0-15 Units Subcutaneous TID WC   insulin aspart  0-5 Units Subcutaneous QHS   levothyroxine  25 mcg Oral Daily    melatonin  3 mg Oral Once   metoprolol succinate  50 mg Oral Daily   multivitamin with minerals  1 tablet Oral Daily   pantoprazole  40 mg Oral BID   thiamine  100 mg Oral Daily   zinc sulfate  220 mg Oral Daily   [name], MD 12/08/2021, 11:30 AM

## 2021-12-08 NOTE — Progress Notes (Incomplete)
PROGRESS NOTE    Stacey Chambers  ZSW:[id number] DOB: [birth year] DOA: 12/01/2021 PCP: Trey Sailors, PA     Brief Narrative:  71 y.o. HF ([name] speaker) PMHx chronic iron deficiency anemia, diabetes mellitus type 2, hypertension, hyperlipidemia, hypothyroidism, history of CKD stage IIIb as well as other comorbidities who presents with worsening lower extremity swelling and elevated blood pressure.  Patient states that she started feeling fatigued and tired since Friday of last week is noticed that her legs started having worsening swelling.  She states that whenever she ambulates she gets very dyspneic on exertion and very fatigued.  States that she denies any abdominal pain and denies any cough, chest pain or lightheadedness or dizziness.  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  She had lower extremity swelling and given her dyspnea on exertion we will obtain an echocardiogram and evaluate for heart failure.  She is also noted to have AKI on CKD stage IIIb.  Incidentally she was found to be COVID-positive.  Gastroenterology was consulted and [facility] was asked to admit this patient for lower extremity swelling.  Interventional radiology was consulted for a paracentesis and this has been done and she underwent a diagnostic tap.   **Interim History  Her edema has improved however renal function is worsened.  She is not complaining of any chest pain or shortness of breath.  Abdomen is doing okay today.  She has been consulted and doing further work-up and nephrology is also been consulted given her worsening renal function   Nephrology has placed the patient on low-dose IV fluids at 65 MLS per hour but now have reduced it to 40 mL/hour now that creatinine is leveling off.  Chest x-ray done today and "New symmetric airspace disease, most  likely due to pulmonary edema. Suspect small layering bilateral pleural effusions" to keep a close eye on this.   Nephrology evaluated and given the emergence of vascular congestion on chest x-ray there is discontinued IV fluids at this time and agreed to give her a trial of albumin and Lasix now to help augment diuresis.  After the Lasix she felt a little dizzy though   Subjective: A/O x4, negative CP, negative SOB.  Patient has the belief that she is going to be placed on the liver transplant list   Assessment & Plan: Covid vaccination;   Principal Problem:   Cirrhosis ([place]) Active Problems:   History of gastric ulcer   History of thrombocytopenia   History of iron deficiency anemia   Gastric ulcer   Dyspnea on exertion   Swelling of lower extremity   Acute kidney injury superimposed on CKD ([place])   Diabetes mellitus type 2 with complications ([place])  New onset Decompensated liver cirrhosis with portal hypertension and associated mild splenomegaly and moderate ascites Mildly elevated AST -Unclear etiology of her liver cirrhosis so we have consulted GI for further evaluation recommendations -Check acute hepatitis panel and Negative  -Obtaining diagnostic paracentesis for moderate volume ascites and may need a therapeutic thoracentesis; diagnostic paracentesis yielded 180 mils of hazy fluid and this was sent off for fluid analysis and not consistent with SBP -Given IV Lasix 20 mg x 2 and now on gentle IVF with NS at 65 mL/hr and it was reduced to 40 mL/hr and changed to Sodium Bicarbonate gtt -AST was 36 -> 40 -> 34 is now 36 -Ammonia  Level was 20 and PT-INR was 17.4/1.4 respectively  -We will defer diuretics to further GI evaluation recommendations -She has had alcohol in the past but denies any currently; has been placed on Solu-Medrol half a milligram per kilogram every 12 for 3 days given her COVID-positive and will stop -Check echocardiogram and showed EF of 60-65% and G2DD -GIs  been consulted for further evaluation recommendations and recommending starting 2 g sodium diet and check an AFP as well as checking an ANA, mitochondrial antibody, smooth muscle antibody, ceruloplasmin and alpha-1 antitrypsin; ASMA is pending -Does not need an EGD as this was recently done as outlined but she had no varices -GI feels that she would benefit from low-dose diuretics however they are hesitant to initiate given her worsening renal status and significant CKD -She does have a history of alcoholism and use in the past years but GI feels that 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 but they are ruling out other etiologies for her cirrhosis with ascites; her alpha-1 antitrypsin level is within normal limits and rest of the autoimmune work-up is still pending -Ceruloplasmin was 16.4 and on the lower side -Further Care per GI and she has had no evidence for hemochromatosis and her [place] screening showed a nonfocal lesion on unenhanced CT scan with no reported liver lesion ultrasound and normal AFP -She had no SBP on recent paracentesis and has grade 2 diastolic dysfunction on echo and she had no esophageal varices back in March 2023 -GI recommending 2 g sodium restricted diet and diuretics when renal function allows but now IV fluids are stopped and she is getting albumin and Lasix per nephrology   Dyspnea on Exertion r/o CHF (Unclear type), improving -Noted to have an elevated BNP on admission 715 -Placed on Combivent 1 puff IH every 6 scheduled -Check ECHOCardiogram and showed G2DD and EF was 60-65% -Received IV Lasix 20 mg x2 and now on IVF with this is now stopping and getting a dose of albumin and Lasix -D-Dimer Elevated but likely in the setting of COVID and is now trending upwards -May consider V/Q Scan given D-Dimer being elevated -D/G Chest X-ray yesterday showed "Mild interstitial edema. Mild patchy consolidation of right lung base, superimposed pneumonia is not  excluded." -SpO2: 97 % -We will consider getting a VQ scan if continues to worsen -Repeat CXR in the AM    COVID-19 Disease -Currently asymptomatic and chest x-ray and chest CT scan done -Check inflammatory markers and D-dimer was elevated at 5.88 and fibrinogen was 371 -CXR as Above -Check Blood Cx x2 and Procalcitonin -Inflammatory Marker Trend:  COVID-19 Labs  Recent Labs    12/06/21 0547  DDIMER 2.64*  FERRITIN 35  CRP 1.4*     Lab Results  Component Value Date   SARSCOV2NAA POSITIVE (A) 12/01/2021      -SpO2: 100 %; Not requiring any Supplemental O2 via [place]  -Airborne and Contract Precautions -Will place on Combivent 1 puff IH q6h -Started Molnupiravir 800 mg po BID x5 Days; -Antitussives with Robitussin DM and Tussionex  -Started Steroids Solumedrol 0.5 mg/kg x12 hours for 3 days and then 50 mg po daily have now stopped since her CRP is not elevated -Start Zinc and Vitamin C   Hypertensive Urgency -BP was elevated on Admission -C/w Amlodipine 2.5 mg pi Daily and Metoprolol Succinate 50 mg po daily  -Added Hydralazine 10 mg q6hprn for SBP>180 or DBP>100 -Continue to Monitor BP per Protocol -Last BP reading was elevated  now and is 147/6   Hx of Gastric Ulcer/GERD/GI Prophylaxis -C/w PPI BID   Diabetes Mellitus Type 2 -Hold Home Glipizide -Check HbA1c and was 4.9 -Place on Moderate Novolog SSI AC/HS -Continue to Monitor and Trend Blood Sugars carefully  -CBGs ranging from 106-118   Hypothyroidism -Check TSH was 2.793 -C/w Levothyroxine 25 mcg po Daily   Acute on CKD stage IIIb/Nephrotic syndrome? -HepatoRenal Syndrome Type 2 vs GN vs. Other -Await nephrology recommendation  Metabolic Acidosis  Hyperphosphatemia -6/11 Will await nephrology recommendation on starting binder  Hyponatremia  -6/7 resolved  Normocytic Anemia -Patient has a history of iron deficiency and take iron supplementation but will hold this for now pending Anemia  Panel -Patient's hemoglobin/hematocrit now 10.2/31.3 -> 8.5/25.8 -> 8.4/26.4 -> 8.0/25.3 -> 7.9/24.8 -> 8.9/27.8 -Anemia panel done and showed an iron level of 21, U IBC of 322, TIBC of 343, saturation ratios of 6%, ferritin level 28, folate level of 28.6 and vitamin B12 931 -Continue to monitor for signs and symptoms of bleeding; no overt bleeding noted -Repeat CBC in a.m.   Hypoalbuminemia -Patient's Albumin Level went from 2.5 -> 2.3x2 -> 2.6 -Expected in liver failure   Thrombocytopenia -Mild, negative sign of overt bleeding.  Continue to monitor closely   Goals of care - 6/7 palliative care consult: Multisystem organ failure evaluate for patient CODE STATUS to DNR, given her serious multiple comorbidities patient requires HCPOA     Mobility Assessment (last 72 hours)     Mobility Assessment     Row Name 12/07/21 0715 12/06/21 0720 12/05/21 1915       Does patient have an order for bedrest or is patient medically unstable No - Continue assessment No - Continue assessment No - Continue assessment     What is the highest level of mobility based on the progressive mobility assessment? Level 5 (Walks with assist in room/hall) - Balance while stepping forward/back and can walk in room with assist - Complete Level 5 (Walks with assist in room/hall) - Balance while stepping forward/back and can walk in room with assist - Complete Level 5 (Walks with assist in room/hall) - Balance while stepping forward/back and can walk in room with assist - Complete                Interdisciplinary Goals of Care Family Meeting   Date carried out: 12/08/2021  Location of the meeting:   Member's involved:   Durable Power of Tour manager:     Discussion: We discussed goals of care for [name] .    Code status:   Disposition:   Time spent for the meeting:     Nadav Swindell J, MD  12/08/2021, 8:48 AM         DVT prophylaxis:  Code  Status: Full Family Communication:  Status is: Inpatient    Dispo: The patient is from: Home              Anticipated d/c is to: Home              Anticipated d/c date is: > 3 days              Patient currently is not medically stable to d/c.      Consultants:  Nephrology GI  Procedures/Significant Events:    I have personally reviewed and interpreted all radiology studies and my findings are as above.  VENTILATOR SETTINGS:    Cultures   Antimicrobials: Anti-infectives (From  admission, onward)    Start     Dose/Rate Route Frequency Ordered Stop   12/01/21 1900  molnupiravir EUA (LAGEVRIO) capsule 800 mg        4 capsule Oral 2 times daily 12/01/21 1546 12/06/21 3244         Devices    LINES / TUBES:      Continuous Infusions:     Objective: Vitals:   12/07/21 1444 12/07/21 1943 12/08/21 0500 12/08/21 0556  BP: (!) 143/70 (!) 163/82  (!) 160/78  Pulse: 67 73  68  Resp:  16  16  Temp:  (!) 97.5 F (36.4 C)  98.6 F (37 C)  TempSrc:  Oral  Oral  SpO2: 98% 98%  98%  Weight:   62.5 kg   Height:        Intake/Output Summary (Last 24 hours) at 12/08/2021 0848 Last data filed at 12/07/2021 1800 Gross per 24 hour  Intake 120 ml  Output 950 ml  Net -830 ml    Filed Weights   12/05/21 0500 12/06/21 0500 12/08/21 0500  Weight: 69.4 kg 68.4 kg 62.5 kg    Examination:  General: A/O x4 No acute respiratory distress Eyes: negative scleral hemorrhage, negative anisocoria, negative icterus ENT: Negative Runny nose, negative gingival bleeding, Neck:  Negative scars, masses, torticollis, lymphadenopathy, JVD Lungs: Clear to auscultation bilaterally without wheezes or crackles Cardiovascular: Regular rate and rhythm without murmur gallop or rub normal S1 and S2 Abdomen: negative abdominal pain, positive distended (ascites), positive soft, bowel sounds, no rebound, no ascites, no appreciable mass Extremities: No significant cyanosis, clubbing, or  edema bilateral lower extremities Skin: Negative rashes, lesions, ulcers Psychiatric:  Negative depression, negative anxiety, negative fatigue, negative mania  Central nervous system:  Cranial nerves II through XII intact, tongue/uvula midline, all extremities muscle strength 5/5, sensation intact throughout,  negative dysarthria, negative expressive aphasia, negative receptive aphasia.  .     Data Reviewed: Care during the described time interval was provided by me .  I have reviewed this patient's available data, including medical history, events of note, physical examination, and all test results as part of my evaluation.  CBC: Recent Labs  Lab 12/04/21 0541 12/05/21 0501 12/06/21 0547 12/07/21 0459 12/08/21 0538  WBC 5.6 4.7 4.7 4.4 3.7*  NEUTROABS 4.1 3.1 2.6 2.7 2.0  HGB 8.0* 7.9* 8.9* 8.8* 8.5*  HCT 25.3* 24.8* 27.8* 27.4* 26.3*  MCV 96.9 96.1 95.5 96.5 96.3  PLT 103* 93* 114* 111* 104*    Basic Metabolic Panel: Recent Labs  Lab 12/04/21 0541 12/05/21 0501 12/06/21 0547 12/07/21 0459 12/07/21 0747 12/08/21 0538  NA 136 137 134* 138  137  --  138  K 4.1 4.0 4.1 4.0  3.9  --  3.7  CL 112* 111 104 106  105  --  104  CO2 17* 20* 23 22  22   --  24  GLUCOSE 105* 99 100* 100*  98  --  93  BUN 66* 59* 60* 59*  59*  --  58*  CREATININE 3.46* 3.28* 3.48* 3.46*  3.41*  --  3.80*  CALCIUM 7.7* 7.7* 7.7* 8.1*  8.0*  --  8.3*  MG 2.0 2.0 2.0 1.9 2.0 1.9  PHOS 4.1 4.1 4.5 4.8*  4.8* 4.8* 4.8*    GFR: Estimated Creatinine Clearance: 11.1 mL/min (A) (by C-G formula based on SCr of 3.8 mg/dL (H)). Liver Function Tests: Recent Labs  Lab 12/04/21 0541 12/05/21 0501 12/06/21 0547 12/07/21 0459 12/08/21  0538  AST 40 34 36 31 30  ALT 24 21 21 19 18   ALKPHOS 97 104 113 104 95  BILITOT 0.6 0.9 0.7 0.9 0.8  PROT 5.9* 5.9* 6.4* 6.3* 6.3*  ALBUMIN 2.3* 2.3* 2.6* 2.5*  2.4* 2.7*    Recent Labs  Lab 12/01/21 1212  LIPASE 94*    Recent Labs  Lab  12/01/21 1652 12/08/21 0538  AMMONIA 20 44*    Coagulation Profile: Recent Labs  Lab 12/01/21 1435  INR 1.4*    Cardiac Enzymes: No results for input(s): "CKTOTAL", "CKMB", "CKMBINDEX", "TROPONINI" in the last 168 hours. BNP (last 3 results) No results for input(s): "PROBNP" in the last 8760 hours. HbA1C: No results for input(s): "HGBA1C" in the last 72 hours. CBG: Recent Labs  Lab 12/07/21 1058 12/07/21 1155 12/07/21 1640 12/07/21 2119 12/08/21 0801  GLUCAP 117* 98 117* 111* 95    Lipid Profile: No results for input(s): "CHOL", "HDL", "LDLCALC", "TRIG", "CHOLHDL", "LDLDIRECT" in the last 72 hours. Thyroid Function Tests: No results for input(s): "TSH", "T4TOTAL", "FREET4", "T3FREE", "THYROIDAB" in the last 72 hours. Anemia Panel: Recent Labs    12/06/21 0547  FERRITIN 35    Sepsis Labs: Recent Labs  Lab 12/01/21 1704  PROCALCITON <0.10     Recent Results (from the past 240 hour(s))  SARS Coronavirus 2 by RT PCR (hospital order, performed in [facility] hospital lab) *cepheid single result test* Anterior Nasal Swab     Status: Abnormal   Collection Time: 12/01/21 12:12 PM   Specimen: Anterior Nasal Swab  Result Value Ref Range Status   SARS Coronavirus 2 by RT PCR POSITIVE (A) NEGATIVE Final    Comment: (NOTE) SARS-CoV-2 target nucleic acids are DETECTED  SARS-CoV-2 RNA is generally detectable in upper respiratory specimens  during the acute phase of infection.  Positive results are indicative  of the presence of the identified virus, but do not rule out bacterial infection or co-infection with other pathogens not detected by the test.  Clinical correlation with patient history and  other diagnostic information is necessary to determine patient infection status.  The expected result is negative.  Fact Sheet for Patients:   https://www.patel.info/   Fact Sheet for Healthcare Providers:   https://hall.com/     This test is not yet approved or cleared by the [place] FDA and  has been authorized for detection and/or diagnosis of SARS-CoV-2 by FDA under an Emergency Use Authorization (EUA).  This EUA will remain in effect (meaning this test can be used) for the duration of  the COVID-19 declaration under Section 564(b)(1)  of the Act, 21 U.S.C. section 360-bbb-3(b)(1), unless the authorization is terminated or revoked sooner.   Performed at [facility], [place] [address] [place], [place] [address]   Fungus Culture With Stain     Status: None (Preliminary result)   Collection Time: 12/01/21  4:27 PM   Specimen: Abdomen; Peritoneal Fluid  Result Value Ref Range Status   Fungus Stain Final report  Final    Comment: (NOTE) Performed At: [facility] [address] [place] [address], [place] [id number] Rush Farmer MD PY:[id number]    Fungus (Mycology) Culture PENDING  Incomplete   Fungal Source PERITONEAL  Final    Comment: Performed at [facility], [place] [address], [place], [place] [address]  Acid Fast Smear (AFB)     Status: None   Collection Time: 12/01/21  4:27 PM   Specimen: Abdomen; Peritoneal Fluid  Result Value Ref  Range Status   AFB Specimen Processing Concentration  Final   Acid Fast Smear Negative  Final    Comment: (NOTE) Performed At: [facility] [place], [place] [id number] Rush Farmer MD [id number]    Source (AFB) PERITONEAL  Final    Comment: Performed at [facility], [place] [address] [place], [place] [address]  Fungus Culture Result     Status: None   Collection Time: 12/01/21  4:27 PM  Result Value Ref Range Status   Result 1 Comment  Final    Comment: (NOTE) KOH/Calcofluor preparation:  no fungus observed. Performed At: [facility] [place], [place] [id number] Rush Farmer MD [id number]   Culture, blood (Routine X 2) w Reflex to ID Panel      Status: None   Collection Time: 12/01/21  4:52 PM   Specimen: BLOOD  Result Value Ref Range Status   Specimen Description   Final    BLOOD SITE NOT SPECIFIED Performed at [place] [address], [place], [place] [address]    Special Requests   Final    BOTTLES DRAWN AEROBIC AND ANAEROBIC Blood Culture results may not be optimal due to an inadequate volume of blood received in culture bottles Performed at [place] [address], [place], [place] [address]    Culture   Final    NO GROWTH 5 DAYS Performed at [facility], [place] [address] [place], [place] [address]    Report Status 12/06/2021 FINAL  Final  Culture, blood (Routine X 2) w Reflex to ID Panel     Status: None   Collection Time: 12/01/21  8:10 PM   Specimen: BLOOD  Result Value Ref Range Status   Specimen Description   Final    BLOOD BLOOD RIGHT HAND Performed at [facility] [address] [place], [place] [address]    Special Requests   Final    IN PEDIATRIC BOTTLE Blood Culture results may not be optimal due to an excessive volume of blood received in culture bottles Performed at [place] [address] [place], [place] [address]    Culture   Final    NO GROWTH 5 DAYS Performed at [facility], [place] [address] [place], [place] [address]    Report Status 12/06/2021 FINAL  Final  Culture, body fluid w Gram Stain-bottle     Status: None   Collection Time: 12/01/21  9:41 PM   Specimen: Peritoneal Washings  Result Value Ref Range Status   Specimen Description PERITONEAL  Final   Special Requests NONE  Final   Culture   Final    NO GROWTH 5 DAYS Performed at [facility] [address], [place], [place] [address]    Report Status 12/06/2021 FINAL  Final  Gram stain     Status: None   Collection Time: 12/01/21  9:41 PM   Specimen: Peritoneal Washings  Result Value Ref Range Status   Specimen  Description PERITONEAL  Final   Special Requests NONE  Final   Gram Stain   Final    NO WBC SEEN NO ORGANISMS SEEN Performed at [facility], [address], [place], [place] [address]    Report Status 12/01/2021 FINAL  Final         Radiology Studies: DG CHEST PORT 1 VIEW  Result Date: 12/07/2021 CLINICAL DATA:  Shortness of breath.  Coronavirus infection.  EXAM: PORTABLE CHEST 1 VIEW COMPARISON:  12/05/2021.  12/04/2021.  12/01/2021. FINDINGS: Apparent enlargement of bilateral pleural effusions, more on the right than the left. Abnormal density the lung that could be due to a combination of edema, atelectasis and pneumonia, worse on the right than the left. IMPRESSION: Most consistent with fluid overload/congestive heart failure. Enlarging effusions right more than left. Mild interstitial edema. Atelectasis related to the effusions. Coexistent pneumonia not excluded. Electronically Signed   By: Nelson Chimes M.D.   On: 12/07/2021 07:40        Scheduled Meds:  amLODipine  2.5 mg Oral Daily   vitamin C  500 mg Oral Daily   docusate sodium  100 mg Oral BID   folic acid  1 mg Oral Daily   heparin  5,000 Units Subcutaneous Q8H   insulin aspart  0-15 Units Subcutaneous TID WC   insulin aspart  0-5 Units Subcutaneous QHS   levothyroxine  25 mcg Oral Daily   melatonin  3 mg Oral Once   metoprolol succinate  50 mg Oral Daily   multivitamin with minerals  1 tablet Oral Daily   pantoprazole  40 mg Oral BID   thiamine  100 mg Oral Daily   zinc sulfate  220 mg Oral Daily   Continuous Infusions:     LOS: 7 days    Time spent:40 min    Lagena Strand, Geraldo Docker, MD [facility]   If 7PM-7AM, please contact night-coverage 12/08/2021, 8:48 AM

## 2021-12-09 ENCOUNTER — Inpatient Hospital Stay (HOSPITAL_COMMUNITY): Payer: Medicare Other

## 2021-12-09 DIAGNOSIS — I160 Hypertensive urgency: Secondary | ICD-10-CM

## 2021-12-09 DIAGNOSIS — R770 Abnormality of albumin: Secondary | ICD-10-CM

## 2021-12-09 DIAGNOSIS — K254 Chronic or unspecified gastric ulcer with hemorrhage: Secondary | ICD-10-CM

## 2021-12-09 DIAGNOSIS — N1832 Chronic kidney disease, stage 3b: Secondary | ICD-10-CM

## 2021-12-09 DIAGNOSIS — N179 Acute kidney failure, unspecified: Secondary | ICD-10-CM | POA: Diagnosis not present

## 2021-12-09 DIAGNOSIS — K769 Liver disease, unspecified: Secondary | ICD-10-CM

## 2021-12-09 DIAGNOSIS — N049 Nephrotic syndrome with unspecified morphologic changes: Secondary | ICD-10-CM

## 2021-12-09 DIAGNOSIS — R531 Weakness: Secondary | ICD-10-CM | POA: Diagnosis not present

## 2021-12-09 DIAGNOSIS — Z515 Encounter for palliative care: Secondary | ICD-10-CM | POA: Diagnosis not present

## 2021-12-09 DIAGNOSIS — K7460 Unspecified cirrhosis of liver: Secondary | ICD-10-CM | POA: Diagnosis not present

## 2021-12-09 DIAGNOSIS — R188 Other ascites: Secondary | ICD-10-CM | POA: Diagnosis not present

## 2021-12-09 DIAGNOSIS — U071 COVID-19: Secondary | ICD-10-CM

## 2021-12-09 DIAGNOSIS — Z7189 Other specified counseling: Secondary | ICD-10-CM | POA: Diagnosis not present

## 2021-12-09 DIAGNOSIS — I10 Essential (primary) hypertension: Secondary | ICD-10-CM | POA: Diagnosis not present

## 2021-12-09 DIAGNOSIS — E118 Type 2 diabetes mellitus with unspecified complications: Secondary | ICD-10-CM | POA: Diagnosis not present

## 2021-12-09 DIAGNOSIS — K7031 Alcoholic cirrhosis of liver with ascites: Secondary | ICD-10-CM | POA: Diagnosis not present

## 2021-12-09 DIAGNOSIS — E119 Type 2 diabetes mellitus without complications: Secondary | ICD-10-CM

## 2021-12-09 DIAGNOSIS — I5031 Acute diastolic (congestive) heart failure: Secondary | ICD-10-CM

## 2021-12-09 DIAGNOSIS — N189 Chronic kidney disease, unspecified: Secondary | ICD-10-CM | POA: Diagnosis not present

## 2021-12-09 LAB — COMPREHENSIVE METABOLIC PANEL
ALT: 17 U/L (ref 0–44)
AST: 30 U/L (ref 15–41)
Albumin: 2.7 g/dL — ABNORMAL LOW (ref 3.5–5.0)
Alkaline Phosphatase: 96 U/L (ref 38–126)
Anion gap: 9 (ref 5–15)
BUN: 56 mg/dL — ABNORMAL HIGH (ref 8–23)
CO2: 24 mmol/L (ref 22–32)
Calcium: 8.1 mg/dL — ABNORMAL LOW (ref 8.9–10.3)
Chloride: 107 mmol/L (ref 98–111)
Creatinine, Ser: 4.01 mg/dL — ABNORMAL HIGH (ref 0.44–1.00)
GFR, Estimated: 12 mL/min — ABNORMAL LOW (ref >60)
Glucose, Bld: 90 mg/dL (ref 70–99)
Potassium: 3.9 mmol/L (ref 3.5–5.1)
Sodium: 140 mmol/L (ref 135–145)
Total Bilirubin: 0.9 mg/dL (ref 0.3–1.2)
Total Protein: 6.2 g/dL — ABNORMAL LOW (ref 6.5–8.1)

## 2021-12-09 LAB — GLUCOSE, CAPILLARY
Glucose-Capillary: 96 mg/dL (ref 70–99)
Glucose-Capillary: 94 mg/dL (ref 70–99)
Glucose-Capillary: 89 mg/dL (ref 70–99)
Glucose-Capillary: 138 mg/dL — ABNORMAL HIGH (ref 70–99)

## 2021-12-09 LAB — CBC WITH DIFFERENTIAL/PLATELET
Basophils Absolute: 0 10*3/uL (ref 0.0–0.1)
Basophils Relative: 1 %
Eosinophils Absolute: 0.2 10*3/uL (ref 0.0–0.5)
Eosinophils Relative: 4 %
HCT: 26.6 % — ABNORMAL LOW (ref 36.0–46.0)
Hemoglobin: 8.7 g/dL — ABNORMAL LOW (ref 12.0–15.0)
Lymphocytes Relative: 26 %
Lymphs Abs: 1 10*3/uL (ref 0.7–4.0)
MCH: 31.2 pg (ref 26.0–34.0)
MCHC: 32.7 g/dL (ref 30.0–36.0)
MCV: 95.3 fL (ref 80.0–100.0)
Monocytes Absolute: 0.6 10*3/uL (ref 0.1–1.0)
Monocytes Relative: 15 %
Neutro Abs: 2.1 10*3/uL (ref 1.7–7.7)
Neutrophils Relative %: 54 %
Platelets: 108 10*3/uL — ABNORMAL LOW (ref 150–400)
RBC: 2.79 MIL/uL — ABNORMAL LOW (ref 3.87–5.11)
RDW: 13.5 % (ref 11.5–15.5)
WBC: 3.9 10*3/uL — ABNORMAL LOW (ref 4.0–10.5)
nRBC: 0 % (ref 0.0–0.2)

## 2021-12-09 LAB — FERRITIN: Ferritin: 41 ng/mL (ref 11–307)

## 2021-12-09 LAB — PHOSPHORUS: Phosphorus: 4.3 mg/dL (ref 2.5–4.6)

## 2021-12-09 LAB — LACTATE DEHYDROGENASE: LDH: 122 U/L (ref 98–192)

## 2021-12-09 LAB — AMMONIA: Ammonia: 63 umol/L — ABNORMAL HIGH (ref 9–35)

## 2021-12-09 LAB — MAGNESIUM: Magnesium: 1.9 mg/dL (ref 1.7–2.4)

## 2021-12-09 LAB — C-REACTIVE PROTEIN: CRP: 0.7 mg/dL (ref <1.0)

## 2021-12-09 LAB — D-DIMER, QUANTITATIVE: D-Dimer, Quant: 3.61 ug/mL-FEU — ABNORMAL HIGH (ref 0.00–0.50)

## 2021-12-09 MED ORDER — HYDRALAZINE HCL 25 MG PO TABS
25.0000 mg | ORAL_TABLET | Freq: Three times a day (TID) | ORAL | Status: DC
  Administered 2021-12-09 – 2021-12-25 (×35): 25 mg via ORAL
  Filled 2021-12-09 (×41): qty 1

## 2021-12-09 MED ORDER — LIDOCAINE HCL 1 % IJ SOLN
INTRAMUSCULAR | Status: AC | PRN
  Administered 2021-12-09: 10 mL via INTRADERMAL

## 2021-12-09 MED ORDER — FENTANYL CITRATE (PF) 100 MCG/2ML IJ SOLN
INTRAMUSCULAR | Status: AC
  Filled 2021-12-09: qty 2

## 2021-12-09 MED ORDER — GELATIN ABSORBABLE 12-7 MM EX MISC
CUTANEOUS | Status: AC
  Filled 2021-12-09: qty 1

## 2021-12-09 MED ORDER — FENTANYL CITRATE (PF) 100 MCG/2ML IJ SOLN
INTRAMUSCULAR | Status: AC | PRN
  Administered 2021-12-09: 50 ug via INTRAVENOUS

## 2021-12-09 MED ORDER — AMLODIPINE BESYLATE 10 MG PO TABS
10.0000 mg | ORAL_TABLET | Freq: Every day | ORAL | Status: DC
  Administered 2021-12-10 – 2021-12-18 (×7): 10 mg via ORAL
  Filled 2021-12-09 (×8): qty 1

## 2021-12-09 MED ORDER — MIDAZOLAM HCL 2 MG/2ML IJ SOLN
INTRAMUSCULAR | Status: AC | PRN
  Administered 2021-12-09: 1 mg via INTRAVENOUS

## 2021-12-09 MED ORDER — GELATIN ABSORBABLE 12-7 MM EX MISC
CUTANEOUS | Status: AC | PRN
  Administered 2021-12-09: 1 via TOPICAL

## 2021-12-09 MED ORDER — LIDOCAINE HCL 1 % IJ SOLN
INTRAMUSCULAR | Status: AC
  Administered 2021-12-09: 10 mL
  Filled 2021-12-09: qty 20

## 2021-12-09 MED ORDER — MIDAZOLAM HCL 2 MG/2ML IJ SOLN
INTRAMUSCULAR | Status: AC
  Filled 2021-12-09: qty 2

## 2021-12-09 MED ORDER — AMLODIPINE BESYLATE 5 MG PO TABS
5.0000 mg | ORAL_TABLET | Freq: Once | ORAL | Status: AC
Start: 2021-12-09 — End: 2021-12-09
  Administered 2021-12-09: 5 mg via ORAL
  Filled 2021-12-09: qty 1

## 2021-12-09 MED ORDER — DEXTROSE-NACL 5-0.9 % IV SOLN: INTRAVENOUS | Status: AC

## 2021-12-09 NOTE — Procedures (Signed)
Interventional Radiology Procedure Note  Procedure: US guided biopsy of liver, random liver Complications: None EBL: None Recommendations: - Bedrest 2 hours, ambulate per primary.   - Routine wound care - Follow up pathology - Advance diet, per primary order  Signed,  Corrie Mckusick, DO

## 2021-12-09 NOTE — Progress Notes (Signed)
Patient ID: Stacey Chambers, female   DOB: [birth year], 71 y.o.   MRN: [id number] [facility] Progress Note   Assessment/ Plan:   1. Acute kidney Injury on CKD Stage IV: With baseline creatinine of 2.1 after gradual progression over the past year. Renal function slightly worse/relatively unchanged overnight with incompletely charted urine output.  She does not have any acute electrolyte abnormality or acute indications to consider renal replacement therapy.  Whereas it is suspected that she might have a GN, this indeed might be hemodynamically mediated acute kidney injury with transient worsening following efforts at diuresis.  I fear there is limited utility with renal biopsy given bilateral renal atrophy and increased echogenicity that may simply be representative of chronic injury/fibrosis. 2. 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  Previously with low ceruloplasmin and an elevated anti-smooth muscle antibody with possible liver biopsy today.  3. Hypertensive urgency: blood pressures remain intermittently elevated on low dose amlodipine, metoprolol and PRN hydralazine.  I increased her amlodipine dose yesterday to 5 mg daily and will follow blood pressure/labs and begin low-dose spironolactone (for both blood pressure control/mouth diuresis) when renal function improved. 4. 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   Subjective:   Denies any acute events overnight, concerned about bilateral lower extremity muscular weakness with instability.  Currently n.p.o. for possible liver biopsy.    Objective:   BP (!) 181/83 (BP Location: Right Arm)   Pulse 65   Temp 98.2 F (36.8 C) (Oral)   Resp 16   Ht 4\' 10"  (1.473 m)   Wt 65.1 kg   SpO2 100%   BMI 30.00 kg/m   Intake/Output Summary  (Last 24 hours) at 12/09/2021 1142 Last data filed at 12/08/2021 1800 Gross per 24 hour  Intake 360 ml  Output --  Net 360 ml   Weight change: 2.6 kg  Physical Exam: TKZ:SWFUXNATFTD resting in bed DUK:GURKY regular rhythm and normal rate, normal s1 and s2 Resp: Decreased breath sounds over bases even with deep inspiratory effort. HCW:CBJS, slightly distended, non-tender, bowel sounds normal Ext: No lower extremity edema  Imaging: No results found.  Labs: BMET Recent Labs  Lab 12/03/21 0553 12/04/21 0541 12/05/21 0501 12/06/21 0547 12/07/21 0459 12/07/21 0747 12/08/21 0538 12/09/21 0523  NA 134* 136 137 134* 138  137  --  138 140  K 3.9 4.1 4.0 4.1 4.0  3.9  --  3.7 3.9  CL 108 112* 111 104 106  105  --  104 107  CO2 17* 17* 20* 23 22  22   --  24 24  GLUCOSE 99 105* 99 100* 100*  98  --  93 90  BUN 61* 66* 59* 60* 59*  59*  --  58* 56*  CREATININE 3.39* 3.46* 3.28* 3.48* 3.46*  3.41*  --  3.80* 4.01*  CALCIUM 7.9* 7.7* 7.7* 7.7* 8.1*  8.0*  --  8.3* 8.1*  PHOS 4.3 4.1 4.1 4.5 4.8*  4.8* 4.8* 4.8* 4.3   CBC Recent Labs  Lab 12/06/21 0547 12/07/21 0459 12/08/21 0538 12/09/21 0523  WBC 4.7 4.4 3.7* 3.9*  NEUTROABS 2.6 2.7 2.0 2.1  HGB 8.9* 8.8* 8.5* 8.7*  HCT 27.8* 27.4* 26.3* 26.6*  MCV 95.5 96.5 96.3 95.3  PLT 114* 111* 104* 108*   Medications:     [START ON 12/10/2021] amLODipine  10 mg Oral  Daily   amLODipine  5 mg Oral Once   vitamin C  500 mg Oral Daily   docusate sodium  100 mg Oral BID   folic acid  1 mg Oral Daily   heparin injection (subcutaneous)  5,000 Units Subcutaneous Q8H   hydrALAZINE  25 mg Oral Q8H   insulin aspart  0-15 Units Subcutaneous TID WC   insulin aspart  0-5 Units Subcutaneous QHS   levothyroxine  25 mcg Oral Daily   melatonin  3 mg Oral Once   metoprolol succinate  50 mg Oral Daily   multivitamin with minerals  1 tablet Oral Daily   pantoprazole  40 mg Oral BID   thiamine  100 mg Oral Daily   zinc sulfate  220 mg  Oral Daily   [name], MD 12/09/2021, 11:42 AM

## 2021-12-09 NOTE — Progress Notes (Addendum)
[facility] Progress Note  CC:  Cirrhosis, newly diagnosed   Subjective: She remains NPO for requested liver biopsy today.  No nausea or vomiting.  She is having mild abdominal discomfort to her subcu heparin injection sites.  Heparin subcu was held this morning in anticipation of liver biopsy today.  No chest pain or shortness of breath.  No family at the bedside.  Objective:  Vital signs in last 24 hours: Temp:  [98.2 F (36.8 C)-98.7 F (37.1 C)] 98.2 F (36.8 C) (06/09 0317) Pulse Rate:  [65-72] 65 (06/09 0317) Resp:  [16-17] 16 (06/09 0317) BP: (142-181)/(78-83) 181/83 (06/09 0317) SpO2:  [99 %-100 %] 100 % (06/09 0317) Weight:  [65.1 kg] 65.1 kg (06/09 0500) Last BM Date : 12/06/21 General: Alert 71 year old female in no acute distress Heart: Regular rate and rhythm, soft systolic murmur Pulm: Sounds clear throughout. Abdomen: Soft, nontender.  Small amount of ascites.  Positive bowel sounds all 4 quadrants.  Scattered ecchymotic areas, subcu heparin injections.  No palpable mass. Extremities:  Without edema. Neurologic:  Alert and  oriented x 4. Grossly normal neurologically. Psych:  Alert and cooperative. Normal mood and affect.  Intake/Output from previous day: 06/08 0701 - 06/09 0700 In: 360 [P.O.:360] Out: -  Intake/Output this shift: No intake/output data recorded.  Lab Results: Recent Labs    12/07/21 0459 12/08/21 0538 12/09/21 0523  WBC 4.4 3.7* 3.9*  HGB 8.8* 8.5* 8.7*  HCT 27.4* 26.3* 26.6*  PLT 111* 104* 108*   BMET Recent Labs    12/07/21 0459 12/08/21 0538 12/09/21 0523  NA 138  137 138 140  K 4.0  3.9 3.7 3.9  CL 106  105 104 107  CO2 '22  22 24 24  ' GLUCOSE 100*  98 93 90  BUN 59*  59* 58* 56*  CREATININE 3.46*  3.41* 3.80* 4.01*  CALCIUM 8.1*  8.0* 8.3* 8.1*   LFT Recent Labs    12/09/21 0523  PROT 6.2*  ALBUMIN 2.7*  AST 30  ALT 17  ALKPHOS 96  BILITOT 0.9   PT/INR Recent Labs    12/08/21 1026   LABPROT 17.4*  INR 1.4*   Hepatitis Panel No results for input(s): "HEPBSAG", "HCVAB", "HEPAIGM", "HEPBIGM" in the last 72 hours.  No results found.  Assessment / Plan:  30) 71 year old female with newly diagnosed cirrhosis with ascites, etiology unclear.  Query alcohol associated cirrhosis. S/P diagnostic paracentesis 6/1, 180cc peritoneal fluid removed. No SBP. SAAG > 1.1 consistent with portal hypertension/cirrhosis. ECHO with normal LV EF and RV function. Urine protein > 300. No focal lesion on unenhanced CT scan. Normal AFP. Negative HBsAg, HCV ab, ANA, AMA and A-1 antitrypsin. No evidence for hemochromatosis. Ceruloplasmin low at 16.4. Serum copper low at 70. ASMA elevated at 52. Negative ANA. IgG 1,508. Total bili 0.9.  Alk phos 95 -> 96.  AST 30.  ALT 17.  No evidence of hepatic encephalopathy.  Ammonia 44 -> 63.  -NPO for possible liver biopsy today -Defer IVF orders to Dr. Benjaman Lobe. Posey Pronto -Heparin SQ 6Am and 2PM doses held today due to possible liver biopsy  -Alcohol abstinence commended -Await further recommendations per Dr. Candis Schatz    2) AKI, Cr 3.46 -> 3.80 -> today Cr 4.01. Unlikely HRS with hypertension. Urine protein > 300. -Nephrology following   3) Covid 19 positive, asymptomatic. Chest xray showed fluid overload/CHF with bilateral pleural effusions, R > L with mild interstitial edema. D. Dimer 3.6.  4) IDA. Hg 8.8 -> 8.5. No overt GI bleeding. EGD 09/12/2021 showed a nonbleeding gastric ulcer.  No varices. Colonoscopy 09/12/2021 resulted in a poop prep, showed diverticulosis and hemorrhoids. Repeat colonoscopy in 6 to 12 months. Continue Pantoprazole 40 mg p.o. twice daily   5) Thrombocytopenia secondary to cirrhosis/mild splenomegaly. PLT 104.    6) Coagulopathy secondary to liver cirrhosis. INR 1.4 on 12/01/2021.  -PT/INR as ordered above   7) Probable small stone in the region of the gallbladder neck per CT. RUQ sono noted a contracted gallbladder with sludge,  possible gallbladder wall thickening without gallstones   8) Hypertensive urgency         Principal Problem:   Cirrhosis ([place]) Active Problems:   History of gastric ulcer   History of thrombocytopenia   History of iron deficiency anemia   Gastric ulcer   Dyspnea on exertion   Swelling of lower extremity   Acute kidney injury superimposed on CKD ([place])   Diabetes mellitus type 2 with complications ([place])     LOS: 8 days   Noralyn Pick  12/09/2021, 11:32 AM  I have taken a history, reviewed the chart and examined the patient. I performed a substantive portion of this encounter, including complete performance of at least one of the key components, in conjunction with the APP. I agree with the APP's note, impression and recommendations  Renal function continues to not improve, may have been exacerbated by diuretics.  Renal failure does not seem very consistent with HRS as discussed in note yesterday. Liver function remains stable.  Liver biopsy planned for today to help determine etiology of cirrhosis (suspect AIH). Fluid status remains stable, does not appear hypervolemic today. Deferring management of diuretics to nephrology/primary team as her renal function appears to be her most pressing medical issue.  Will continue to follow peripherally over the weekend.    Petro Talent E. Candis Schatz, MD [facility]

## 2021-12-09 NOTE — Progress Notes (Signed)
Chaplain followed up on consult to see patient and was told it was related to patient wanting to see Catholic priest yesterday.  Today patient declines seeing a priest.  If needs arise over the weekend, please page the chaplain on-call at [phone].  Please note that until she is off restrictions, it may be difficult to find a Catholic priest.  Kathrynn Humble, [place] Pager [phone]

## 2021-12-09 NOTE — Consult Note (Signed)
Consultation Note Date: 12/09/2021   Patient Name: Stacey Chambers  DOB: 1951-04-04  MRN: [id number]  Age / Sex: 71 y.o., female  PCP: Trey Sailors, [place] Referring Physician: Allie Bossier, MD  Reason for Consultation: Establishing goals of care  HPI/Patient Profile: 71 y.o. female  admitted on 12/01/2021    Clinical Assessment and Goals of Care: 71 year old lady primarily [name]-speaking, history of chronic iron deficiency anemia, diabetes, hypertension, dyslipidemia, hypothyroidism, stage III chronic kidney disease presented with worsening lower extremity swelling and elevated blood pressures, feeling fatigued.  Patient was noticing that she was getting short of breath on exertion and was having lower extremity swelling.  In the emergency room she was found to have new onset liver cirrhosis with portal hypertension and associated splenomegaly and moderate volume ascites.  She had echocardiogram done.  She was also incidentally found to be COVID-positive.  She has been admitted to hospital medicine service with the gastroenterology and renal colleagues following.  Patient remains admitted to hospital medicine service for new onset decompensated liver cirrhosis with portal hypertension, mild splenomegaly and moderate ascites.  She has diastolic CHF.  She has acute on chronic stage III chronic kidney disease.  Renal colleagues are following.  Serum creatinine worsens every day.  Concern for type II hepatorenal syndrome versus glomerulonephropathy.  Also undergoing liver biopsy today.  Palliative medicine team consulted for CODE STATUS and goals of care discussions.  Patient awake alert resting in bed.  She is asking her bedside nurse when her liver biopsy is scheduled for.  With the help of the iPad in the room [name] interpreter Mr. Freida Busman was reached.  A detailed CODE STATUS and goals of care conversation  was a held today at the time of the initial consultation using interpreter services.  Palliative medicine is specialized medical care for people living with serious illness. It focuses on providing relief from the symptoms and stress of a serious illness. The goal is to improve quality of life for both the patient and the family. Goals of care: Broad aims of medical therapy in relation to the patient's values and preferences. Our aim is to provide medical care aimed at enabling patients to achieve the goals that matter most to them, given the circumstances of their particular medical situation and their constraints.   Goals wishes and values attempted to be explored.  Asked the patient how much she knew about her condition.  Patient states that that she has been having lower extremity edema.  She understands that she is to undergo a liver biopsy today.  She also understands that her kidneys are not working well.  She does not know why this is happening.  She states that she has left it in God and the doctor's hands.  She states that her children's father was on dialysis and she is familiar with dialysis treatments.  She becomes tearful and overwhelmed throughout the encounter.  She states that she wants to continue to try to receive any and all offered therapies and is  willing to undergo treatments and efforts to live and to aim for recovery/stabilization.  She wishes to continue with full CODE STATUS for now.  She hopes that her renal function will recover.  She wants to know what her liver biopsy results will convey.  She truly does not know who to designate as her healthcare power of attorney agent.  She is tearful on multiple occasions throughout this initial consultation.  Offered active listening, compassionate presence and supportive care and other therapeutic techniques at the time of this initial encounter.  Remains full code/full scope for now.  She will be accepting of dialysis support if it is  offered.  NEXT OF KIN The only person listed in the contacts/demographics section is with the patient's coworker from when she used to you work in [employer]. She states that she has 2 daughters 1 who lives in [place] and 1 who lives in [place].  She has a son who lives in [place].  She has 2 brothers who live in [place] but she has not seen them in the last 50 years.  She does not not have any of her kids phone numbers.  SUMMARY OF RECOMMENDATIONS   Full code/full scope care for now For follow renal function Monitor results of liver biopsy No designated HCPOA agent.  Continue efforts to further delineate CODE STATUS and goals of care with the patient throughout the course of this hospitalization.  Palliative medicine team to continue to follow along.  Thank you for the consult.  Code Status/Advance Care Planning: Full code   Symptom Management:     Palliative Prophylaxis:  Frequent Pain Assessment  Additional Recommendations (Limitations, Scope, Preferences): Full Scope Treatment  Psycho-social/Spiritual:  Desire for further Chaplaincy support:yes Additional Recommendations: Caregiving  Support/Resources  Prognosis:  Unable to determine  Discharge Planning: To Be Determined      Primary Diagnoses: Present on Admission:  Cirrhosis ([place])  Gastric ulcer   I have reviewed the medical record, interviewed the patient and family, and examined the patient. The following aspects are pertinent.  Past Medical History:  Diagnosis Date   Anemia    Diabetes mellitus without complication ([place])    Hypertension    Vision loss    Social History   Socioeconomic History   Marital status: Married    Spouse name: Not on file   Number of children: Not on file   Years of education: Not on file   Highest education level: Not on file  Occupational History   Not on file  Tobacco Use   Smoking status: Never   Smokeless tobacco: Never  Vaping Use   Vaping Use: Never used   Substance and Sexual Activity   Alcohol use: Never    Comment: once in awhile   Drug use: Not Currently    Types: Marijuana    Comment: Use of THC tea 1-2 times per week   Sexual activity: Not on file  Other Topics Concern   Not on file  Social History Narrative   Not on file   Social Determinants of Health   Financial Resource Strain: Not on file  Food Insecurity: Not on file  Transportation Needs: Not on file  Physical Activity: Not on file  Stress: Not on file  Social Connections: Not on file   Family History  Problem Relation Age of Onset   Diabetes Brother    Colon cancer Neg Hx    Esophageal cancer Neg Hx    Stomach cancer Neg Hx  Pancreatic cancer Neg Hx    Inflammatory bowel disease Neg Hx    Liver disease Neg Hx    Rectal cancer Neg Hx    Colon polyps Neg Hx    Scheduled Meds:  [START ON 12/10/2021] amLODipine  10 mg Oral Daily   amLODipine  5 mg Oral Once   vitamin C  500 mg Oral Daily   docusate sodium  100 mg Oral BID   fentaNYL       folic acid  1 mg Oral Daily   gelatin adsorbable       heparin injection (subcutaneous)  5,000 Units Subcutaneous Q8H   hydrALAZINE  25 mg Oral Q8H   insulin aspart  0-15 Units Subcutaneous TID WC   insulin aspart  0-5 Units Subcutaneous QHS   levothyroxine  25 mcg Oral Daily   lidocaine       melatonin  3 mg Oral Once   metoprolol succinate  50 mg Oral Daily   midazolam       multivitamin with minerals  1 tablet Oral Daily   pantoprazole  40 mg Oral BID   thiamine  100 mg Oral Daily   zinc sulfate  220 mg Oral Daily   Continuous Infusions:  dextrose 5 % and 0.9% NaCl     PRN Meds:.acetaminophen **OR** acetaminophen, chlorpheniramine-HYDROcodone, fentaNYL, fluticasone, gelatin adsorbable, guaiFENesin-dextromethorphan, hydrALAZINE, Ipratropium-Albuterol, lidocaine, melatonin, midazolam, ondansetron **OR** ondansetron (ZOFRAN) IV, oxyCODONE, polyethylene glycol, traZODone Medications Prior to Admission:  Prior to  Admission medications   Medication Sig Start Date End Date Taking? Authorizing Provider  amLODipine (NORVASC) 2.5 MG tablet Take 2.5 mg by mouth daily. 11/25/21  Yes [provider]  atorvastatin (LIPITOR) 20 MG tablet Take 20 mg by mouth daily. 11/25/21  Yes [provider]  esomeprazole (NEXIUM) 40 MG capsule Take 1 capsule (40 mg total) by mouth 2 (two) times daily. 09/13/21  Yes Mansouraty, Telford Nab., MD  ferrous gluconate (FERGON) 324 MG tablet Take 1 tablet (324 mg total) by mouth daily with breakfast. 09/13/21  Yes Mansouraty, Telford Nab., MD  fluticasone [facility]) 50 MCG/ACT nasal spray Place 1 spray into both nostrils daily as needed for allergies. 10/20/21  Yes [provider]  glipiZIDE (GLUCOTROL) 5 MG tablet Take 2.5 mg by mouth daily. 03/10/21  Yes [provider]  levothyroxine (SYNTHROID) 25 MCG tablet Take 25 mcg by mouth daily. 11/17/21  Yes [provider]  metoprolol succinate (TOPROL-XL) 50 MG 24 hr tablet Take 50 mg by mouth daily. 10/20/21  Yes [provider]  Vitamin D, Ergocalciferol, (DRISDOL) 1.25 MG (50000 UNIT) CAPS capsule Take 50,000 Units by mouth once a week. Wednesday 10/25/21  Yes [provider]  ACCU-CHEK GUIDE test strip  02/01/21   [provider]  Accu-Chek Softclix Lancets lancets SMARTSIG:Topical 01/31/21   [provider]   No Known Allergies Review of Systems Complains of episodic abdominal and back discomfort.  Physical Exam Awake alert oriented Resting in bed Abdomen mildly distended No edema bilateral lower extremities Patient has some ecchymotic/purpuric spots upper extremities Regular work of breathing S1-S2  Vital Signs: BP (!) 185/79 (BP Location: Right Arm)   Pulse 71   Temp 98.3 F (36.8 C) (Oral)   Resp 18   Ht 4\' 10"  (1.473 m)   Wt 65.1 kg   SpO2 100%   BMI 30.00 kg/m  Pain Scale: 0-10   Pain Score: Asleep   SpO2: SpO2: 100 % O2 Device:SpO2: 100 % O2  Flow Rate: .  O2 Flow Rate (L/min): 2 L/min  IO: Intake/output summary:  Intake/Output Summary (Last 24 hours) at 12/09/2021 1613 Last data filed at 12/08/2021 1800 Gross per 24 hour  Intake 360 ml  Output --  Net 360 ml    LBM: Last BM Date : 12/06/21 Baseline Weight: Weight: 65 kg Most recent weight: Weight: 65.1 kg     Palliative Assessment/Data:     Palliative performance scale 50%.  Time In: 1510 Time Out: 1610 Time Total: 60 Greater than 50%  of this time was spent counseling and coordinating care related to the above assessment and plan.  Signed by: Loistine Chance, MD   Please contact Palliative Medicine Team phone at [phone] for questions and concerns.  For individual provider: See Shea Evans

## 2021-12-09 NOTE — Progress Notes (Incomplete)
Introduction: 38 YOHF admitted d/t LEE and HTN History of present illness:  Past medical history/allergies/family history pertinent to current illness:  Admitted on 12/01/21 w/ LEE and HTN (191/93). Patient also has complaints of DOE. Patient was found to have new onset liver cirrhosis w/ portal HTN, ascites and splenomegaly. Paracentesis performed, 180 mL removed Also positive for Covid-19  Currently resting comfortably though abdomen still distended.   PTA meds: - Ergocalciferol 1.25 mg 50,000 U/wk - Metoprolol succinate XL 50 mg tab daily - Levothyroxine 25 mcg tab daily - glipizide 2.5 mg daily - Fluticasone 50 mcg/act - atorvastatin 20 mg daily - Amlodipine 2.5 mg tab - Esomeprazole 40 mg cap BID - Ferrous gluconate 324 mg tab daily Hx - AKI on CKD stage IV, cirrhosis/portal HTN, HTN, Covid-19  Social hx: - No smoking, drugs, or alcohol use - Former alcohol use Physical exam/relevant labs, diagnostic testing/imaging: - CXR: bilateral pleural effusions judged consistent w/ CHF - CT abdomen: bibasilar opacities in lungs CBC    Component Value Date/Time   WBC 3.9 (L) 12/09/2021 0523   RBC 2.79 (L) 12/09/2021 0523   HGB 8.7 (L) 12/09/2021 0523   HCT 26.6 (L) 12/09/2021 0523   PLT 108 (L) 12/09/2021 0523   MCV 95.3 12/09/2021 0523   MCH 31.2 12/09/2021 0523   MCHC 32.7 12/09/2021 0523   RDW 13.5 12/09/2021 0523   LYMPHSABS 1.0 12/09/2021 0523   MONOABS 0.6 12/09/2021 0523   EOSABS 0.2 12/09/2021 0523   BASOSABS 0.0 12/09/2021 0523  - AST: 30 - ALT: 17 - BUN: 56 - Albumin: 2.7 - Lipase: 94 - Ammonia 63 Problem based assessment and plan:  - Problem 1: LEE/Ascites/Cirrhosis  A. Assessment: - Edema has improved since admit as well as ascites as a result of paracentesis. Cirrhosis attributed to NAFLD d/t prior EtOH exposure B. Plan: - Restrict salt intake - Monitor for varices - Monitor Ammonia level/for hepatic encephalopathy, consider lactulose if continued  rise  - Problem 2: HTN  A. Assessment: BP hovering in 180's even w/ use of hydralazine 25 mg Q8H, metoprolol 50 mg XL daily and amlodipine 5 mg daily. AKI limits options for BP treatment. Patient's HR in the 60's limits augmentation of metoprolol dose.   B. Plan: - Consider spironolactone when renal function improves - Monitor LEE reappearance w/ use of amlodipine - Consider   pending renal improvement - Monitor HR w/ metoprolol - Problem 3: AKI on Chronic CKD  A. Assessment:   Renal function continues to trend down since admit, Even in absence of diuresis. B. Plan: - Continue IV fluids - Monitor Scr  CHF (diastolic) Assessment  Patient displays symptoms consistent w/ HF and has a LVEF of

## 2021-12-09 NOTE — Care Management Important Message (Signed)
Important Message  Patient Details IM Letter placed in Patients room. Name: Stacey Chambers MRN: [id number] Date of Birth: [birth year]   Medicare Important Message Given:  Yes     Kerin Salen 12/09/2021, 11:10 AM

## 2021-12-09 NOTE — Progress Notes (Signed)
Referring Physician(s): Cunningham,S  Supervising Physician: Corrie Mckusick  Patient Status:  [facility] - In-pt  Chief Complaint: Cirrhosis, ascites   Subjective: Patient familiar to IR service from paracentesis on 12/01/2021.  She has a history of newly diagnosed cirrhosis with ascites of uncertain etiology along with acute kidney injury, portal hypertension, mild splenomegaly, diverticulosis, L3 compression fracture ,recent COVID-19, iron deficiency anemia, thrombocytopenia, hypertension, and diabetes.  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  Request now received from GI team for image guided random liver biopsy for further evaluation.  Patient currently denies fever, headache, chest pain, worsening dyspnea, cough, nausea, vomiting or bleeding.  She does have some intermittent abdominal and back pain.  Past Medical History:  Diagnosis Date   Anemia    Diabetes mellitus without complication ([employer])    Hypertension    Vision loss    Past Surgical History:  Procedure Laterality Date   BACK SURGERY     COLONOSCOPY     UPPER GASTROINTESTINAL ENDOSCOPY        Allergies: Patient has no known allergies.  Medications: Prior to Admission medications   Medication Sig Start Date End Date Taking? Authorizing Provider  amLODipine (NORVASC) 2.5 MG tablet Take 2.5 mg by mouth daily. 11/25/21  Yes [provider]  atorvastatin (LIPITOR) 20 MG tablet Take 20 mg by mouth daily. 11/25/21  Yes [provider]  esomeprazole (NEXIUM) 40 MG capsule Take 1 capsule (40 mg total) by mouth 2 (two) times daily. 09/13/21  Yes Mansouraty, Telford Nab., MD  ferrous gluconate (FERGON) 324 MG tablet Take 1 tablet (324 mg total) by mouth daily with breakfast. 09/13/21  Yes Mansouraty, Telford Nab., MD  fluticasone [facility]) 50 MCG/ACT nasal spray Place 1 spray into both nostrils daily as needed for allergies. 10/20/21  Yes [provider]  glipiZIDE (GLUCOTROL) 5 MG tablet Take 2.5 mg by mouth daily. 03/10/21  Yes [provider]  levothyroxine (SYNTHROID) 25 MCG tablet Take 25 mcg by mouth daily. 11/17/21  Yes [provider]  metoprolol succinate (TOPROL-XL) 50 MG 24 hr tablet Take 50 mg by mouth daily. 10/20/21  Yes [provider]  Vitamin D, Ergocalciferol, (DRISDOL) 1.25 MG (50000 UNIT) CAPS capsule Take 50,000 Units by mouth once a week. Wednesday 10/25/21  Yes [provider]  ACCU-CHEK GUIDE test strip  02/01/21   [provider]  Accu-Chek Softclix Lancets lancets SMARTSIG:Topical 01/31/21   [provider]     Vital Signs: BP (!) 181/83 (BP Location: Right Arm)   Pulse 65   Temp 98.2 F (36.8 C) (Oral)   Resp 16   Ht 4\' 10"  (1.473 m)   Wt 143 lb 8.3 oz (65.1 kg)   SpO2 100%   BMI 30.00 kg/m   Physical Exam awake, alert.  Chest with diminished breath sounds bases.  Heart with regular rate /rhythm, soft murmur.  Abdomen soft, slightly distended, positive bowel sounds, currently nontender.  No pretibial edema.  Imaging: DG CHEST PORT 1 VIEW  Result Date: 12/07/2021 CLINICAL DATA:  Shortness of breath.  Coronavirus infection. EXAM: PORTABLE CHEST 1 VIEW COMPARISON:  12/05/2021.  12/04/2021.  12/01/2021. FINDINGS: Apparent enlargement of bilateral pleural effusions, more on the right than the left. Abnormal density the lung that could be due to a combination of edema, atelectasis and pneumonia, worse on the right than the left. IMPRESSION: Most consistent with fluid overload/congestive heart failure. Enlarging effusions right more than left. Mild interstitial edema.  Atelectasis related to the effusions. Coexistent pneumonia not excluded. Electronically Signed   By: Nelson Chimes M.D.   On: 12/07/2021 07:40    Labs:  CBC: Recent Labs    12/06/21 0547 12/07/21 0459 12/08/21 0538 12/09/21 0523  WBC 4.7 4.4 3.7* 3.9*  HGB 8.9* 8.8* 8.5* 8.7*  HCT 27.8*  27.4* 26.3* 26.6*  PLT 114* 111* 104* 108*    COAGS: Recent Labs    12/01/21 1435 12/08/21 1026  INR 1.4* 1.4*    BMP: Recent Labs    12/06/21 0547 12/07/21 0459 12/08/21 0538 12/09/21 0523  NA 134* 138  137 138 140  K 4.1 4.0  3.9 3.7 3.9  CL 104 106  105 104 107  CO2 23 22  22 24 24   GLUCOSE 100* 100*  98 93 90  BUN 60* 59*  59* 58* 56*  CALCIUM 7.7* 8.1*  8.0* 8.3* 8.1*  CREATININE 3.48* 3.46*  3.41* 3.80* 4.01*  GFRNONAA 14* 14*  14* 12* 12*    LIVER FUNCTION TESTS: Recent Labs    12/06/21 0547 12/07/21 0459 12/08/21 0538 12/09/21 0523  BILITOT 0.7 0.9 0.8 0.9  AST 36 31 30 30   ALT 21 19 18 17   ALKPHOS 113 104 95 96  PROT 6.4* 6.3* 6.3* 6.2*  ALBUMIN 2.6* 2.5*  2.4* 2.7* 2.7*    Assessment and Plan: Patient familiar to IR service from paracentesis on 12/01/2021.  She has a history of newly diagnosed cirrhosis with ascites of uncertain etiology along with acute kidney injury, portal hypertension, mild splenomegaly, diverticulosis, L3 compression fracture ,recent COVID-19, iron deficiency anemia, thrombocytopenia, hypertension, and diabetes.  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  Request now received from GI team for image guided random liver biopsy for further evaluation, possible paracentesis.Risks and benefits of procedure was discussed with the patient via interpreter including, but not limited to bleeding, infection, damage to adjacent structures or low yield requiring additional tests.  All of the questions were answered and there is agreement to proceed.  Consent signed and in chart.  Procedure tentatively scheduled for later today. Current labs include WBC 3.9, hemoglobin 8.7, platelets 108k, PT 17.4, INR 1.4   Electronically Signed: D. Rowe Robert, PA-C 12/09/2021, 2:35 PM   I spent a total of 25 Minutes at the the patient's bedside AND on the patient's hospital floor or unit,  greater than 50% of which was counseling/coordinating care for image guided random core liver biopsy/possible paracentesis.    Patient ID: Stacey Chambers, female   DOB: 05/16/1951, 71 y.o.   MRN: [id number]

## 2021-12-09 NOTE — Progress Notes (Signed)
PROGRESS NOTE    Stacey Chambers  YHC:[id number] DOB: 16-Nov-1950 DOA: 12/01/2021 PCP: Trey Sailors, PA     Brief Narrative:  71 y.o. HF ([name] speaker) PMHx chronic iron deficiency anemia, diabetes mellitus type 2, hypertension, hyperlipidemia, hypothyroidism, history of CKD stage IIIb as well as other comorbidities who presents with worsening lower extremity swelling and elevated blood pressure.  Patient states that she started feeling fatigued and tired since Friday of last week is noticed that her legs started having worsening swelling.  She states that whenever she ambulates she gets very dyspneic on exertion and very fatigued.  States that she denies any abdominal pain and denies any cough, chest pain or lightheadedness or dizziness.  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  She had lower extremity swelling and given her dyspnea on exertion we will obtain an echocardiogram and evaluate for heart failure.  She is also noted to have AKI on CKD stage IIIb.  Incidentally she was found to be COVID-positive.  Gastroenterology was consulted and [facility] was asked to admit this patient for lower extremity swelling.  Interventional radiology was consulted for a paracentesis and this has been done and she underwent a diagnostic tap.   **Interim History  Her edema has improved however renal function is worsened.  She is not complaining of any chest pain or shortness of breath.  Abdomen is doing okay today.  She has been consulted and doing further work-up and nephrology is also been consulted given her worsening renal function   Nephrology has placed the patient on low-dose IV fluids at 65 MLS per hour but now have reduced it to 40 mL/hour now that creatinine is leveling off.  Chest x-ray done today and "New symmetric airspace disease, most  likely due to pulmonary edema. Suspect small layering bilateral pleural effusions" to keep a close eye on this.   Nephrology evaluated and given the emergence of vascular congestion on chest x-ray there is discontinued IV fluids at this time and agreed to give her a trial of albumin and Lasix now to help augment diuresis.  After the Lasix she felt a little dizzy though   Subjective: 6/9 afebrile overnight, BP has trended up   Assessment & Plan: Covid vaccination;   Principal Problem:   Cirrhosis ([place]) Active Problems:   History of gastric ulcer   History of thrombocytopenia   History of iron deficiency anemia   Gastric ulcer   Dyspnea on exertion   Swelling of lower extremity   Acute kidney injury superimposed on CKD ([place])   Diabetes mellitus type 2 with complications (HCC)   Acute diastolic CHF (congestive heart failure) ([place])   2019 novel coronavirus disease (COVID-19)   Hypertensive urgency   Diabetes mellitus type 2, controlled, without complications ([place])   Acute renal failure superimposed on stage 3b chronic kidney disease (HCC)   Nephrotic syndrome   Normocytic anemia   Low serum albumin   Thrombocytopenia ([place])  New onset Decompensated liver cirrhosis with portal HTN/mild splenomegaly/moderate ascites  -Unclear etiology of her liver cirrhosis; consulted GI for further evaluation  -Acute hepatitis panel negative -Diagnostic paracentesis: Aspirated 128m sent for pathology: Not consistent with SBP -SAAG > 1.1 consistent with portal hypertension/cirrhosis. -6/8 discussed case with Dr. CCandis SchatzGI plan is for A.m. biopsy of liver - Hx EtOH abuse - 6/2 Echocardiogram EF of 60-65% and G2DD.  See results below -Query EtOH associated cirrhosis.   -Urine protein > 300.  -No focal lesion on unenhanced CT scan.  -AFP WNL -.HBsAg, HCV ab, ANA, AMA and A-1 antitrypsin Negative.  -No evidence for hemochromatosis. Ceruloplasmin low at 16.4. Serum copper level pending.  -ASMA  elevated at 52.  -Negative ANA. IgG pending.   -Total bili 0.8.  Alk phos 95.  AST 30.  ALT 18.  No evidence of hepatic encephalopathy. -Ammonia =44.   -INR/PT. Antimicrosomal Ab -LK. -Calculate MELD score after PT/INR completed  -Eventual 24 hr urine copper to rule out Wilson's disease  -Possible transjugular liver biopsy pending  -2gm low sodium diet   Dyspnea on Exertion r/o CHF (Unclear type) -6/8 resolved  Acute Diastolic CHF -On admission BNP on admission 715 -Strict in and out - Daily weight    Hypertensive Urgency -BP was elevated on Admission -6/9 increase amlodipine 10 mg daily -6/9 Hydralazine IV PRN -6/9 Hydralazine 25 mg TID -6/9 Toprol 50 mg daily    COVID-19 Disease -Currently asymptomatic  COVID-19 Labs  Recent Labs    12/09/21 0523 12/10/21 0623  DDIMER 3.61* 6.87*  FERRITIN 41 26  LDH 122 109  CRP 0.7 0.7    Lab Results  Component Value Date   SARSCOV2NAA POSITIVE (A) 12/01/2021    -SpO2: 100 %; Not requiring any Supplemental O2 via [place] -Airborne and Contract Precautions -Combivent 1 puff IH q6h -Started Molnupiravir 800 mg po BID x5 Days; -Antitussives with Robitussin DM and Tussionex  -Started Steroids Solumedrol 0.5 mg/kg x12 hours for 3 days and then 50 mg po  daily have now stopped since her CRP is not elevated -Start Zinc and Vitamin C  Hx of Gastric Ulcer/GERD/GI Prophylaxis -C/w PPI BID   Diabetes Mellitus Type 2 -Hold Home Glipizide -Check HbA1c and was 4.9 -Place on Moderate Novolog SSI AC/HS -Continue to Monitor and Trend Blood Sugars carefully  -CBGs ranging from 106-118   Hypothyroidism -Check TSH was 2.793 -C/w Levothyroxine 25 mcg po Daily   Acute on CKD stage IIIb/Nephrotic syndrome Lab Results  Component Value Date   CREATININE 4.06 (H) 12/10/2021   CREATININE 4.01 (H) 12/09/2021   CREATININE 3.80 (H) 12/08/2021   CREATININE 3.46 (H) 12/07/2021   CREATININE 3.41 (H) 12/07/2021  -Most likely hepatorenal  syndrome -Nephrology managing diuresis - Strict in and out - Daily weight - 6/9 D5-0.9% saline 16OM/AY  Metabolic Acidosis  Hyperphosphatemia -6/11 Will await nephrology recommendation on starting binder  Hyponatremia  -6/7 resolved  Normocytic Anemia -Patient has a history of iron deficiency and take iron supplementation but will hold this for now pending Anemia Panel -Patient's hemoglobin/hematocrit now 10.2/31.3 -> 8.5/25.8 -> 8.4/26.4 -> 8.0/25.3 -> 7.9/24.8 -> 8.9/27.8 -Continue to monitor for signs and symptoms of bleeding; no overt bleeding noted Lab Results  Component Value Date   HGB 8.6 (L) 12/10/2021   HGB 8.7 (L) 12/09/2021   HGB 8.5 (L) 12/08/2021   HGB 8.8 (L) 12/07/2021   HGB 8.9 (L) 12/06/2021     Hypoalbuminemia -Patient's Albumin Level went from 2.5 -> 2.3x2 -> 2.6 -Expected in liver failure   Thrombocytopenia -Mild, negative sign of overt bleeding.  Continue to monitor closely   Goals of care - 6/7 palliative care consult: Multisystem organ failure evaluate for patient CODE STATUS to DNR, given her serious multiple comorbidities patient requires HCPOA     Mobility Assessment (last 72 hours)     Mobility Assessment   No documentation.  Interdisciplinary Goals of Care Family Meeting   Date carried out: 12/10/2021  Location of the meeting:   Member's involved:   Durable Power of Tour manager:     Discussion: We discussed goals of care for [name] .    Code status:   Disposition:   Time spent for the meeting:     Allie Bossier, MD  12/10/2021, 2:43 PM         DVT prophylaxis:  Code Status: Full Family Communication:  Status is: Inpatient    Dispo: The patient is from: Home              Anticipated d/c is to: Home              Anticipated d/c date is: > 3 days              Patient currently is not medically stable to d/c.      Consultants:   Nephrology GI  Procedures/Significant Events:  6/2 Echocardiogram Left Ventricle: Left ventricular ejection fraction, by estimation, is 60  to 65%. The left ventricle has normal function. The left ventricle has no  regional wall motion abnormalities. The left ventricular internal cavity  size was normal in size. There is   no left ventricular hypertrophy. Left ventricular diastolic parameters  are consistent with Grade II diastolic dysfunction (pseudonormalization).   Right Ventricle: The right ventricular size is normal. Right ventricular  systolic function is normal. There is mildly elevated pulmonary artery  systolic pressure. The tricuspid regurgitant velocity is 3.01 m/s, and  with an assumed right atrial pressure  of 3 mmHg, the estimated right ventricular systolic pressure is 46.2 mmHg.   Left Atrium: Left atrial size was mildly dilated.   Right Atrium: Right atrial size was normal in size.   Pericardium: There is no evidence of pericardial effusion.   Mitral Valve: The mitral valve is normal in structure. No evidence of  mitral valve regurgitation.   Tricuspid Valve: The tricuspid valve is not well visualized. Tricuspid  valve regurgitation is mild.   Aortic Valve: There is mild calcification of the aortic valve. Aortic  valve regurgitation is trivial. Aortic valve mean gradient measures 9.0  mmHg. Aortic valve peak gradient measures 17.8 mmHg. Aortic valve area, by  VTI measures 2.52 cm.   Pulmonic Valve: The pulmonic valve was not well visualized. Pulmonic valve  regurgitation is not visualized.   Aorta: The aortic root and ascending aorta are structurally normal, with  no evidence of dilitation.   Venous: The inferior vena cava is normal in size with greater than 50%  respiratory variability, suggesting right atrial pressure of 3 mmHg.   IAS/Shunts: No atrial level shunt detected by color flow Doppler.   I have personally reviewed and interpreted all  radiology studies and my findings are as above.  VENTILATOR SETTINGS:    Cultures   Antimicrobials: Anti-infectives (From admission, onward)    Start     Ordered Stop   12/01/21 1900  molnupiravir EUA (LAGEVRIO) capsule 800 mg        12/01/21 1546 12/06/21 0904         Devices    LINES / TUBES:      Continuous Infusions:     Objective: Vitals:   12/09/21 2001 12/10/21 0500 12/10/21 0634 12/10/21 1413  BP: (!) 160/76  129/60 138/70  Pulse: 70  71 66  Resp: '18  18 18  ' Temp: 98.5 F (36.9  C)  98.5 F (36.9 C) 98.4 F (36.9 C)  TempSrc: Oral  Oral Oral  SpO2: 99%  100% 100%  Weight:  61.2 kg    Height:        Intake/Output Summary (Last 24 hours) at 12/10/2021 1443 Last data filed at 12/09/2021 2100 Gross per 24 hour  Intake 120 ml  Output 200 ml  Net -80 ml   Filed Weights   12/08/21 0500 12/09/21 0500 12/10/21 0500  Weight: 62.5 kg 65.1 kg 61.2 kg    Examination:  General: A/O x4 No acute respiratory distress Eyes: negative scleral hemorrhage, negative anisocoria, negative icterus ENT: Negative Runny nose, negative gingival bleeding, Neck:  Negative scars, masses, torticollis, lymphadenopathy, JVD Lungs: Clear to auscultation bilaterally without wheezes or crackles Cardiovascular: Regular rate and rhythm without murmur gallop or rub normal S1 and S2 Abdomen: negative abdominal pain, positive distended (ascites), positive soft, bowel sounds, no rebound, no ascites, no appreciable mass Extremities: No significant cyanosis, clubbing, or edema bilateral lower extremities Skin: Negative rashes, lesions, ulcers Psychiatric:  Negative depression, negative anxiety, negative fatigue, negative mania  Central nervous system:  Cranial nerves II through XII intact, tongue/uvula midline, all extremities muscle strength 5/5, sensation intact throughout,  negative dysarthria, negative expressive aphasia, negative receptive aphasia.  .     Data Reviewed: Care  during the described time interval was provided by me .  I have reviewed this patient's available data, including medical history, events of note, physical examination, and all test results as part of my evaluation.  CBC: Recent Labs  Lab 12/06/21 0547 12/07/21 0459 12/08/21 0538 12/09/21 0523 12/10/21 0623  WBC 4.7 4.4 3.7* 3.9* 4.5  NEUTROABS 2.6 2.7 2.0 2.1 2.9  HGB 8.9* 8.8* 8.5* 8.7* 8.6*  HCT 27.8* 27.4* 26.3* 26.6* 26.8*  MCV 95.5 96.5 96.3 95.3 95.7  PLT 114* 111* 104* 108* 161*   Basic Metabolic Panel: Recent Labs  Lab 12/06/21 0547 12/07/21 0459 12/07/21 0747 12/08/21 0538 12/09/21 0523 12/10/21 0623  NA 134* 138  137  --  138 140 140  K 4.1 4.0  3.9  --  3.7 3.9 3.9  CL 104 106  105  --  104 107 110  CO2 '23 22  22  ' --  '24 24 23  ' GLUCOSE 100* 100*  98  --  93 90 118*  BUN 60* 59*  59*  --  58* 56* 56*  CREATININE 3.48* 3.46*  3.41*  --  3.80* 4.01* 4.06*  CALCIUM 7.7* 8.1*  8.0*  --  8.3* 8.1* 8.3*  MG 2.0 1.9 2.0 1.9 1.9 2.2  PHOS 4.5 4.8*  4.8* 4.8* 4.8* 4.3 4.6   GFR: Estimated Creatinine Clearance: 10.3 mL/min (A) (by C-G formula based on SCr of 4.06 mg/dL (H)). Liver Function Tests: Recent Labs  Lab 12/06/21 0547 12/07/21 0459 12/08/21 0538 12/09/21 0523 12/10/21 0623  AST 36 '31 30 30 27  ' ALT '21 19 18 17 17  ' ALKPHOS 113 104 95 96 94  BILITOT 0.7 0.9 0.8 0.9 0.9  PROT 6.4* 6.3* 6.3* 6.2* 6.3*  ALBUMIN 2.6* 2.5*  2.4* 2.7* 2.7* 2.7*   No results for input(s): "LIPASE", "AMYLASE" in the last 168 hours.  Recent Labs  Lab 12/08/21 0538 12/09/21 0523 12/10/21 0623  AMMONIA 44* 63* 42*   Coagulation Profile: Recent Labs  Lab 12/08/21 1026  INR 1.4*   Cardiac Enzymes: No results for input(s): "CKTOTAL", "CKMB", "CKMBINDEX", "TROPONINI" in the last 168 hours. BNP (last 3 results)  No results for input(s): "PROBNP" in the last 8760 hours. HbA1C: No results for input(s): "HGBA1C" in the last 72 hours. CBG: Recent Labs  Lab  12/09/21 1159 12/09/21 1646 12/09/21 2002 12/10/21 0804 12/10/21 1149  GLUCAP 94 89 138* 136* 130*   Lipid Profile: No results for input(s): "CHOL", "HDL", "LDLCALC", "TRIG", "CHOLHDL", "LDLDIRECT" in the last 72 hours. Thyroid Function Tests: No results for input(s): "TSH", "T4TOTAL", "FREET4", "T3FREE", "THYROIDAB" in the last 72 hours. Anemia Panel: Recent Labs    12/09/21 0523 12/10/21 0623  FERRITIN 41 26   Sepsis Labs: No results for input(s): "PROCALCITON", "LATICACIDVEN" in the last 168 hours.   Recent Results (from the past 240 hour(s))  SARS Coronavirus 2 by RT PCR (hospital order, performed in [facility] hospital lab) *cepheid single result test* Anterior Nasal Swab     Status: Abnormal   Collection Time: 12/01/21 12:12 PM   Specimen: Anterior Nasal Swab  Result Value Ref Range Status   SARS Coronavirus 2 by RT PCR POSITIVE (A) NEGATIVE Final    Comment: (NOTE) SARS-CoV-2 target nucleic acids are DETECTED  SARS-CoV-2 RNA is generally detectable in upper respiratory specimens  during the acute phase of infection.  Positive results are indicative  of the presence of the identified virus, but do not rule out bacterial infection or co-infection with other pathogens not detected by the test.  Clinical correlation with patient history and  other diagnostic information is necessary to determine patient infection status.  The expected result is negative.  Fact Sheet for Patients:   https://www.patel.info/   Fact Sheet for Healthcare Providers:   https://hall.com/    This test is not yet approved or cleared by the [place] FDA and  has been authorized for detection and/or diagnosis of SARS-CoV-2 by FDA under an Emergency Use Authorization (EUA).  This EUA will remain in effect (meaning this test can be used) for the duration of  the COVID-19 declaration under Section 564(b)(1)  of the Act, 21 U.S.C. section  360-bbb-3(b)(1), unless the authorization is terminated or revoked sooner.   Performed at [facility], [place] [address] [place], [place] [address]   Fungus Culture With Stain     Status: None (Preliminary result)   Collection Time: 12/01/21  4:27 PM   Specimen: Abdomen; Peritoneal Fluid  Result Value Ref Range Status   Fungus Stain Final report  Final    Comment: (NOTE) Performed At: [facility] [address] [place], [place] [id number] Rush Farmer MD AT:[id number]    Fungus (Mycology) Culture PENDING  Incomplete   Fungal Source PERITONEAL  Final    Comment: Performed at  [facility] [place] [address], [place], [place] [address]  Acid Fast Smear (AFB)     Status: None   Collection Time: 12/01/21  4:27 PM   Specimen: Abdomen; Peritoneal Fluid  Result Value Ref Range Status   AFB Specimen Processing Concentration  Final   Acid Fast Smear Negative  Final    Comment: (NOTE) Performed At: [facility] [place], [place] [id number] Rush Farmer MD VO:[id number]    Source (AFB) PERITONEAL  Final    Comment: Performed at [facility], [place] [address] [place], [place] [address]  Fungus Culture Result     Status: None   Collection Time: 12/01/21  4:27 PM  Result Value Ref Range Status   Result 1 Comment  Final    Comment: (NOTE) KOH/Calcofluor preparation:  no fungus observed. Performed At:  [facility] [place], [place] [id number] Rush Farmer MD [id number]   Culture, blood (Routine X 2) w Reflex to ID Panel     Status: None   Collection Time: 12/01/21  4:52 PM   Specimen: BLOOD  Result Value Ref Range Status   Specimen Description   Final    BLOOD SITE NOT SPECIFIED Performed at [place] [address], [place], [place] [address]    Special Requests   Final    BOTTLES DRAWN AEROBIC AND ANAEROBIC Blood Culture results may not be  optimal due to an inadequate volume of blood received in culture bottles Performed at [facility] [address], [place], [place] [address]    Culture   Final    NO GROWTH 5 DAYS Performed at [facility], [place] [address] [place], [place] [address]    Report Status 12/06/2021 FINAL  Final  Culture, blood (Routine X 2) w Reflex to ID Panel     Status: None   Collection Time: 12/01/21  8:10 PM   Specimen: BLOOD  Result Value Ref Range Status   Specimen Description   Final    BLOOD BLOOD RIGHT HAND Performed at [place] [address], [place], [place] [address]    Special Requests   Final    IN PEDIATRIC BOTTLE Blood Culture results may not be optimal due to an excessive volume of blood received in culture bottles Performed at [facility] [address] [place], [place] [address]    Culture   Final    NO GROWTH 5 DAYS Performed at [facility], [place] [address], [place], [place] [address]    Report Status 12/06/2021 FINAL  Final  Culture, body fluid w Gram Stain-bottle     Status: None   Collection Time: 12/01/21  9:41 PM   Specimen: Peritoneal Washings  Result Value Ref Range Status   Specimen Description PERITONEAL  Final   Special Requests NONE  Final   Culture   Final    NO GROWTH 5 DAYS Performed at [facility] [address], [place], [place] [address]    Report Status 12/06/2021 FINAL  Final  Gram stain     Status: None   Collection Time: 12/01/21  9:41 PM   Specimen: Peritoneal Washings  Result Value Ref Range Status   Specimen Description PERITONEAL  Final   Special Requests NONE  Final   Gram Stain   Final    NO WBC SEEN NO ORGANISMS SEEN Performed at [facility], [address] [place],  [address]    Report Status 12/01/2021 FINAL  Final         Radiology Studies: US BIOPSY (LIVER)  Result Date: 12/09/2021 INDICATION: 71 year old female referred for  medical liver biopsy, cirrhosis EXAM: ULTRASOUND-GUIDED MEDICAL LIVER BIOPSY MEDICATIONS: None. ANESTHESIA/SEDATION: Moderate (conscious) sedation was employed during this procedure. A total of Versed 2.0 mg and Fentanyl 100 mcg was administered intravenously by the radiology nurse. Total intra-service moderate Sedation Time: 14 minutes. The patient's level of consciousness and vital signs were monitored continuously by radiology nursing throughout the procedure under my direct supervision. COMPLICATIONS: None PROCEDURE: Informed written consent was obtained from the patient via [name] interpretation after a thorough discussion of the procedural risks, benefits and alternatives. All questions were addressed. Maximal Sterile Barrier Technique was utilized including caps, mask, sterile gowns, sterile gloves, sterile drape, hand hygiene and skin  antiseptic. A timeout was performed prior to the initiation of the procedure. Ultrasound survey of the right liver lobe performed with images stored and sent to PACs. The subxiphoid region was prepped with chlorhexidine in a sterile fashion, and a sterile drape was applied covering the operative field. A sterile gown and sterile gloves were used for the procedure. Local anesthesia was provided with 1% Lidocaine. The patient was prepped and draped sterilely and the skin and subcutaneous tissues were generously infiltrated with 1% lidocaine. A 17 gauge introducer needle was then advanced under ultrasound guidance into the left liver via subxiphoid location, avoiding the ascites. The stylet was removed, and multiple separate 18 gauge core biopsy were retrieved. Samples were placed into formalin for transportation to the lab. Gel-Foam pledgets were then infused with a small amount of saline for assistance with hemostasis. The needle was removed, and a final ultrasound image was performed. The patient tolerated the procedure well and remained hemodynamically stable throughout. No  complications were encountered and no significant blood loss was encounter. IMPRESSION: Status post medical liver biopsy, left liver lobe. Signed, Dulcy Fanny. Dellia Nims, RPVI [facility] Radiology Electronically Signed   By: Corrie Mckusick D.O.   On: 12/09/2021 17:22        Scheduled Meds:  amLODipine  10 mg Oral Daily   vitamin C  500 mg Oral Daily   docusate sodium  100 mg Oral BID   folic acid  1 mg Oral Daily   heparin injection (subcutaneous)  5,000 Units Subcutaneous Q8H   hydrALAZINE  25 mg Oral Q8H   insulin aspart  0-15 Units Subcutaneous TID WC   insulin aspart  0-5 Units Subcutaneous QHS   levothyroxine  25 mcg Oral Daily   melatonin  3 mg Oral Once   metoprolol succinate  50 mg Oral Daily   multivitamin with minerals  1 tablet Oral Daily   pantoprazole  40 mg Oral BID   thiamine  100 mg Oral Daily   zinc sulfate  220 mg Oral Daily   Continuous Infusions:     LOS: 9 days    Time spent:40 min    Brando Taves, Geraldo Docker, MD [facility]   If 7PM-7AM, please contact night-coverage 12/10/2021, 2:43 PM

## 2021-12-10 ENCOUNTER — Inpatient Hospital Stay (HOSPITAL_COMMUNITY): Payer: Medicare Other

## 2021-12-10 DIAGNOSIS — U071 COVID-19: Secondary | ICD-10-CM | POA: Diagnosis not present

## 2021-12-10 DIAGNOSIS — E119 Type 2 diabetes mellitus without complications: Secondary | ICD-10-CM

## 2021-12-10 DIAGNOSIS — N1832 Acute kidney failure, unspecified: Secondary | ICD-10-CM | POA: Diagnosis present

## 2021-12-10 DIAGNOSIS — D696 Thrombocytopenia, unspecified: Secondary | ICD-10-CM | POA: Diagnosis present

## 2021-12-10 DIAGNOSIS — R770 Abnormality of albumin: Secondary | ICD-10-CM | POA: Diagnosis present

## 2021-12-10 DIAGNOSIS — N049 Nephrotic syndrome with unspecified morphologic changes: Secondary | ICD-10-CM

## 2021-12-10 DIAGNOSIS — I10 Essential (primary) hypertension: Secondary | ICD-10-CM | POA: Diagnosis not present

## 2021-12-10 DIAGNOSIS — I160 Hypertensive urgency: Secondary | ICD-10-CM | POA: Diagnosis present

## 2021-12-10 DIAGNOSIS — N179 Acute kidney failure, unspecified: Secondary | ICD-10-CM | POA: Diagnosis not present

## 2021-12-10 DIAGNOSIS — I5031 Acute diastolic (congestive) heart failure: Secondary | ICD-10-CM | POA: Diagnosis present

## 2021-12-10 DIAGNOSIS — E118 Type 2 diabetes mellitus with unspecified complications: Secondary | ICD-10-CM | POA: Diagnosis not present

## 2021-12-10 DIAGNOSIS — K7031 Alcoholic cirrhosis of liver with ascites: Secondary | ICD-10-CM | POA: Diagnosis not present

## 2021-12-10 DIAGNOSIS — D649 Anemia, unspecified: Secondary | ICD-10-CM | POA: Diagnosis present

## 2021-12-10 DIAGNOSIS — R7989 Other specified abnormal findings of blood chemistry: Secondary | ICD-10-CM | POA: Diagnosis not present

## 2021-12-10 LAB — CBC WITH DIFFERENTIAL/PLATELET
Abs Immature Granulocytes: 0.02 10*3/uL (ref 0.00–0.07)
Basophils Absolute: 0 10*3/uL (ref 0.0–0.1)
Basophils Relative: 1 %
Eosinophils Absolute: 0.1 10*3/uL (ref 0.0–0.5)
Eosinophils Relative: 1 %
HCT: 26.8 % — ABNORMAL LOW (ref 36.0–46.0)
Hemoglobin: 8.6 g/dL — ABNORMAL LOW (ref 12.0–15.0)
Immature Granulocytes: 0 %
Lymphocytes Relative: 20 %
Lymphs Abs: 0.9 10*3/uL (ref 0.7–4.0)
MCH: 30.7 pg (ref 26.0–34.0)
MCHC: 32.1 g/dL (ref 30.0–36.0)
MCV: 95.7 fL (ref 80.0–100.0)
Monocytes Absolute: 0.6 10*3/uL (ref 0.1–1.0)
Monocytes Relative: 13 %
Neutro Abs: 2.9 10*3/uL (ref 1.7–7.7)
Neutrophils Relative %: 65 %
Platelets: 103 10*3/uL — ABNORMAL LOW (ref 150–400)
RBC: 2.8 MIL/uL — ABNORMAL LOW (ref 3.87–5.11)
RDW: 13.5 % (ref 11.5–15.5)
WBC: 4.5 10*3/uL (ref 4.0–10.5)
nRBC: 0 % (ref 0.0–0.2)

## 2021-12-10 LAB — COMPREHENSIVE METABOLIC PANEL
ALT: 17 U/L (ref 0–44)
AST: 27 U/L (ref 15–41)
Albumin: 2.7 g/dL — ABNORMAL LOW (ref 3.5–5.0)
Alkaline Phosphatase: 94 U/L (ref 38–126)
Anion gap: 7 (ref 5–15)
BUN: 56 mg/dL — ABNORMAL HIGH (ref 8–23)
CO2: 23 mmol/L (ref 22–32)
Calcium: 8.3 mg/dL — ABNORMAL LOW (ref 8.9–10.3)
Chloride: 110 mmol/L (ref 98–111)
Creatinine, Ser: 4.06 mg/dL — ABNORMAL HIGH (ref 0.44–1.00)
GFR, Estimated: 11 mL/min — ABNORMAL LOW (ref >60)
Glucose, Bld: 118 mg/dL — ABNORMAL HIGH (ref 70–99)
Potassium: 3.9 mmol/L (ref 3.5–5.1)
Sodium: 140 mmol/L (ref 135–145)
Total Bilirubin: 0.9 mg/dL (ref 0.3–1.2)
Total Protein: 6.3 g/dL — ABNORMAL LOW (ref 6.5–8.1)

## 2021-12-10 LAB — LACTATE DEHYDROGENASE: LDH: 109 U/L (ref 98–192)

## 2021-12-10 LAB — GLUCOSE, CAPILLARY
Glucose-Capillary: 136 mg/dL — ABNORMAL HIGH (ref 70–99)
Glucose-Capillary: 130 mg/dL — ABNORMAL HIGH (ref 70–99)
Glucose-Capillary: 135 mg/dL — ABNORMAL HIGH (ref 70–99)
Glucose-Capillary: 130 mg/dL — ABNORMAL HIGH (ref 70–99)

## 2021-12-10 LAB — C-REACTIVE PROTEIN: CRP: 0.7 mg/dL (ref <1.0)

## 2021-12-10 LAB — D-DIMER, QUANTITATIVE: D-Dimer, Quant: 6.87 ug/mL-FEU — ABNORMAL HIGH (ref 0.00–0.50)

## 2021-12-10 LAB — PROTIME-INR
INR: 1.3 — ABNORMAL HIGH (ref 0.8–1.2)
Prothrombin Time: 15.8 seconds — ABNORMAL HIGH (ref 11.4–15.2)

## 2021-12-10 LAB — AMMONIA: Ammonia: 42 umol/L — ABNORMAL HIGH (ref 9–35)

## 2021-12-10 LAB — ANTI-MICROSOMAL ANTIBODY LIVER / KIDNEY: LKM1 Ab: 1 Units (ref 0.0–20.0)

## 2021-12-10 LAB — PHOSPHORUS: Phosphorus: 4.6 mg/dL (ref 2.5–4.6)

## 2021-12-10 LAB — MAGNESIUM: Magnesium: 2.2 mg/dL (ref 1.7–2.4)

## 2021-12-10 LAB — FERRITIN: Ferritin: 26 ng/mL (ref 11–307)

## 2021-12-10 MED ORDER — HEPARIN SODIUM (PORCINE) 5000 UNIT/ML IJ SOLN
5000.0000 [IU] | Freq: Three times a day (TID) | INTRAMUSCULAR | Status: DC
  Administered 2021-12-10 – 2021-12-12 (×6): 5000 [IU] via SUBCUTANEOUS
  Filled 2021-12-10 (×6): qty 1

## 2021-12-10 MED ORDER — ALBUMIN HUMAN 25 % IV SOLN
25.0000 g | Freq: Once | INTRAVENOUS | Status: AC
  Administered 2021-12-10: 12.5 g via INTRAVENOUS
  Filled 2021-12-10: qty 100

## 2021-12-10 NOTE — Progress Notes (Incomplete)
PROGRESS NOTE    Joycelin Radloff  YOV:[id number] DOB: Sep 19, 1950 DOA: 12/01/2021 PCP: Trey Sailors, PA     Brief Narrative:  71 y.o. HF ([name] speaker) PMHx chronic iron deficiency anemia, diabetes mellitus type 2, hypertension, hyperlipidemia, hypothyroidism, history of CKD stage IIIb as well as other comorbidities who presents with worsening lower extremity swelling and elevated blood pressure.  Patient states that she started feeling fatigued and tired since Friday of last week is noticed that her legs started having worsening swelling.  She states that whenever she ambulates she gets very dyspneic on exertion and very fatigued.  States that she denies any abdominal pain and denies any cough, chest pain or lightheadedness or dizziness.  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  She had lower extremity swelling and given her dyspnea on exertion we will obtain an echocardiogram and evaluate for heart failure.  She is also noted to have AKI on CKD stage IIIb.  Incidentally she was found to be COVID-positive.  Gastroenterology was consulted and [facility] was asked to admit this patient for lower extremity swelling.  Interventional radiology was consulted for a paracentesis and this has been done and she underwent a diagnostic tap.   **Interim History  Her edema has improved however renal function is worsened.  She is not complaining of any chest pain or shortness of breath.  Abdomen is doing okay today.  She has been consulted and doing further work-up and nephrology is also been consulted given her worsening renal function   Nephrology has placed the patient on low-dose IV fluids at 65 MLS per hour but now have reduced it to 40 mL/hour now that creatinine is leveling off.  Chest x-ray done today and "New symmetric airspace disease, most  likely due to pulmonary edema. Suspect small layering bilateral pleural effusions" to keep a close eye on this.   Nephrology evaluated and given the emergence of vascular congestion on chest x-ray there is discontinued IV fluids at this time and agreed to give her a trial of albumin and Lasix now to help augment diuresis.  After the Lasix she felt a little dizzy though   Subjective: 6/10 afebrile overnight      Assessment & Plan: Covid vaccination;   Principal Problem:   Cirrhosis ([place]) Active Problems:   History of gastric ulcer   History of thrombocytopenia   History of iron deficiency anemia   Gastric ulcer   Dyspnea on exertion   Swelling of lower extremity   Acute kidney injury superimposed on CKD ([place])   Diabetes mellitus type 2 with complications ([place])  New onset Decompensated liver cirrhosis with portal HTN/mild splenomegaly/moderate ascites  -Unclear etiology of her liver cirrhosis; consulted GI for further evaluation  -Acute hepatitis panel negative -Diagnostic paracentesis: Aspirated 173m sent for pathology: Not consistent with SBP -SAAG > 1.1 consistent with portal hypertension/cirrhosis. -6/8 discussed case with Dr. CCandis SchatzGI plan is for A.m. biopsy of liver - Hx EtOH abuse - 6/2 Echocardiogram EF of 60-65% and G2DD.  See results below   Query alcohol associated cirrhosis.   Urine protein > 300. No focal lesion on unenhanced CT scan. Normal AFP. Negative HBsAg, HCV ab, ANA, AMA and A-1 antitrypsin. No evidence for hemochromatosis. Ceruloplasmin low at 16.4. Serum copper level pending. ASMA elevated at 52. Negative ANA. IgG pending.  Total bili 0.8.  Alk phos  95.  AST 30.  ALT 18.  No evidence of hepatic encephalopathy.  Ammonia 44.  Received albumin IV x 1.  Received furosemide 40 mg IV x1 on 12/07/2021. -INR/PT. Antimicrosomal Ab -LK. -Calculate MELD score after PT/INR completed  -Eventual 24 hr urine copper to rule out Wilson's disease  -Possible transjugular  liver biopsy during this admission -2gm low sodium diet  -No alcohol   Dyspnea on Exertion r/o CHF (Unclear type) -6/8 resolved  Acute Diastolic CHF -On admission BNP on admission 715 -Strict in and out - Daily weight  -Placed on Combivent 1 puff IH every 6 scheduled -Check ECHOCardiogram and showed G2DD and EF was 60-65% -Received IV Lasix 20 mg x2 and now on IVF with this is now stopping and getting a dose of albumin and Lasix -D-Dimer Elevated but likely in the setting of COVID and is now trending upwards -May consider V/Q Scan given D-Dimer being elevated -D/G Chest X-ray yesterday showed "Mild interstitial edema. Mild patchy consolidation of right lung base, superimposed pneumonia is not excluded." -SpO2: 97 % -We will consider getting a VQ scan if continues to worsen -Repeat CXR in the AM     Hypertensive Urgency -BP was elevated on Admission -6/9 increase amlodipine 10 mg daily -6/9 Hydralazine IV PRN -6/9 Hydralazine 25 mg TID -6/9 Toprol 50 mg daily    COVID-19 Disease -Currently asymptomatic  COVID-19 Labs  Recent Labs    12/09/21 0523 12/10/21 0623  DDIMER 3.61* 6.87*  FERRITIN 41 26  LDH 122 109  CRP 0.7 0.7     Lab Results  Component Value Date   SARSCOV2NAA POSITIVE (A) 12/01/2021      -SpO2: 100 %; Not requiring any Supplemental O2 via [place]  -Airborne and Contract Precautions -Will place on Combivent 1 puff IH q6h -Started Molnupiravir 800 mg po BID x5 Days; -Antitussives with Robitussin DM and Tussionex  -Started Steroids Solumedrol 0.5 mg/kg x12 hours for 3 days and then 50 mg po daily have now stopped since her CRP is not elevated -Start Zinc and Vitamin C    Hx of Gastric Ulcer/GERD/GI Prophylaxis -C/w PPI BID   Diabetes Mellitus Type 2 -Hold Home Glipizide -Check HbA1c and was 4.9 -Place on Moderate Novolog SSI AC/HS -Continue to Monitor and Trend Blood Sugars carefully  -CBGs ranging from 106-118   Hypothyroidism -Check TSH was  2.793 -C/w Levothyroxine 25 mcg po Daily   Acute on CKD stage IIIb/Nephrotic syndrome Lab Results  Component Value Date   CREATININE 4.06 (H) 12/10/2021   CREATININE 4.01 (H) 12/09/2021   CREATININE 3.80 (H) 12/08/2021   CREATININE 3.46 (H) 12/07/2021   CREATININE 3.41 (H) 12/07/2021  -Most likely hepatorenal syndrome -Nephrology managing diuresis - Strict in and out - Daily weight - 6/9 D5-0.9% saline 90WI/OX  Metabolic Acidosis  Hyperphosphatemia -6/11 Will await nephrology recommendation on starting binder  Hyponatremia  -6/7 resolved  Normocytic Anemia -Patient has a history of iron deficiency and take iron supplementation but will hold this for now pending Anemia Panel -Patient's hemoglobin/hematocrit now 10.2/31.3 -> 8.5/25.8 -> 8.4/26.4 -> 8.0/25.3 -> 7.9/24.8 -> 8.9/27.8 -Anemia panel done and showed an iron level of 21, U IBC of 322, TIBC of 343, saturation ratios of 6%, ferritin level 28, folate level of 28.6 and vitamin B12 931 -Continue to monitor for signs and symptoms of bleeding; no overt bleeding noted -Repeat CBC in a.m.   Hypoalbuminemia -Patient's Albumin Level went from 2.5 -> 2.3x2 -> 2.6 -Expected in liver failure  Thrombocytopenia -Mild, negative sign of overt bleeding.  Continue to monitor closely   Goals of care - 6/7 palliative care consult: Multisystem organ failure evaluate for patient CODE STATUS to DNR, given her serious multiple comorbidities patient requires HCPOA     Mobility Assessment (last 72 hours)     Mobility Assessment   No documentation.             Interdisciplinary Goals of Care Family Meeting   Date carried out: 12/10/2021  Location of the meeting:   Member's involved:   Durable Power of Tour manager:     Discussion: We discussed goals of care for [name] .    Code status:   Disposition:   Time spent for the meeting:     Davonna Ertl J,  MD  12/10/2021, 2:21 PM         DVT prophylaxis:  Code Status: Full Family Communication:  Status is: Inpatient    Dispo: The patient is from: Home              Anticipated d/c is to: Home              Anticipated d/c date is: > 3 days              Patient currently is not medically stable to d/c.      Consultants:  Nephrology GI  Procedures/Significant Events:  6/2 Echocardiogram Left Ventricle: Left ventricular ejection fraction, by estimation, is 60  to 65%. The left ventricle has normal function. The left ventricle has no  regional wall motion abnormalities. The left ventricular internal cavity  size was normal in size. There is   no left ventricular hypertrophy. Left ventricular diastolic parameters  are consistent with Grade II diastolic dysfunction (pseudonormalization).   Right Ventricle: The right ventricular size is normal. Right ventricular  systolic function is normal. There is mildly elevated pulmonary artery  systolic pressure. The tricuspid regurgitant velocity is 3.01 m/s, and  with an assumed right atrial pressure  of 3 mmHg, the estimated right ventricular systolic pressure is 44.0 mmHg.   Left Atrium: Left atrial size was mildly dilated.   Right Atrium: Right atrial size was normal in size.   Pericardium: There is no evidence of pericardial effusion.   Mitral Valve: The mitral valve is normal in structure. No evidence of  mitral valve regurgitation.   Tricuspid Valve: The tricuspid valve is not well visualized. Tricuspid  valve regurgitation is mild.   Aortic Valve: There is mild calcification of the aortic valve. Aortic  valve regurgitation is trivial. Aortic valve mean gradient measures 9.0  mmHg. Aortic valve peak gradient measures 17.8 mmHg. Aortic valve area, by  VTI measures 2.52 cm.   Pulmonic Valve: The pulmonic valve was not well visualized. Pulmonic valve  regurgitation is not visualized.   Aorta: The aortic root and  ascending aorta are structurally normal, with  no evidence of dilitation.   Venous: The inferior vena cava is normal in size with greater than 50%  respiratory variability, suggesting right atrial pressure of 3 mmHg.   IAS/Shunts: No atrial level shunt detected by color flow Doppler.   I have personally reviewed and interpreted all radiology studies and my findings are as above.  VENTILATOR SETTINGS:    Cultures   Antimicrobials: Anti-infectives (From admission, onward)    Start     Ordered Stop   12/01/21 1900  molnupiravir EUA (LAGEVRIO) capsule 800 mg  12/01/21 1546 12/06/21 0904         Devices    LINES / TUBES:      Continuous Infusions:     Objective: Vitals:   12/09/21 2001 12/10/21 0500 12/10/21 0634 12/10/21 1413  BP: (!) 160/76  129/60 138/70  Pulse: 70  71 66  Resp: '18  18 18  ' Temp: 98.5 F (36.9 C)  98.5 F (36.9 C) 98.4 F (36.9 C)  TempSrc: Oral  Oral Oral  SpO2: 99%  100% 100%  Weight:  61.2 kg    Height:        Intake/Output Summary (Last 24 hours) at 12/10/2021 1421 Last data filed at 12/09/2021 2100 Gross per 24 hour  Intake 120 ml  Output 200 ml  Net -80 ml    Filed Weights   12/08/21 0500 12/09/21 0500 12/10/21 0500  Weight: 62.5 kg 65.1 kg 61.2 kg    Examination:  General: A/O x4 No acute respiratory distress Eyes: negative scleral hemorrhage, negative anisocoria, negative icterus ENT: Negative Runny nose, negative gingival bleeding, Neck:  Negative scars, masses, torticollis, lymphadenopathy, JVD Lungs: Clear to auscultation bilaterally without wheezes or crackles Cardiovascular: Regular rate and rhythm without murmur gallop or rub normal S1 and S2 Abdomen: negative abdominal pain, positive distended (ascites), positive soft, bowel sounds, no rebound, no ascites, no appreciable mass Extremities: No significant cyanosis, clubbing, or edema bilateral lower extremities Skin: Negative rashes, lesions,  ulcers Psychiatric:  Negative depression, negative anxiety, negative fatigue, negative mania  Central nervous system:  Cranial nerves II through XII intact, tongue/uvula midline, all extremities muscle strength 5/5, sensation intact throughout,  negative dysarthria, negative expressive aphasia, negative receptive aphasia.  .     Data Reviewed: Care during the described time interval was provided by me .  I have reviewed this patient's available data, including medical history, events of note, physical examination, and all test results as part of my evaluation.  CBC: Recent Labs  Lab 12/06/21 0547 12/07/21 0459 12/08/21 0538 12/09/21 0523 12/10/21 0623  WBC 4.7 4.4 3.7* 3.9* 4.5  NEUTROABS 2.6 2.7 2.0 2.1 2.9  HGB 8.9* 8.8* 8.5* 8.7* 8.6*  HCT 27.8* 27.4* 26.3* 26.6* 26.8*  MCV 95.5 96.5 96.3 95.3 95.7  PLT 114* 111* 104* 108* 103*    Basic Metabolic Panel: Recent Labs  Lab 12/06/21 0547 12/07/21 0459 12/07/21 0747 12/08/21 0538 12/09/21 0523 12/10/21 0623  NA 134* 138  137  --  138 140 140  K 4.1 4.0  3.9  --  3.7 3.9 3.9  CL 104 106  105  --  104 107 110  CO2 '23 22  22  ' --  '24 24 23  ' GLUCOSE 100* 100*  98  --  93 90 118*  BUN 60* 59*  59*  --  58* 56* 56*  CREATININE 3.48* 3.46*  3.41*  --  3.80* 4.01* 4.06*  CALCIUM 7.7* 8.1*  8.0*  --  8.3* 8.1* 8.3*  MG 2.0 1.9 2.0 1.9 1.9 2.2  PHOS 4.5 4.8*  4.8* 4.8* 4.8* 4.3 4.6    GFR: Estimated Creatinine Clearance: 10.3 mL/min (A) (by C-G formula based on SCr of 4.06 mg/dL (H)). Liver Function Tests: Recent Labs  Lab 12/06/21 0547 12/07/21 0459 12/08/21 0538 12/09/21 0523 12/10/21 0623  AST 36 '31 30 30 27  ' ALT '21 19 18 17 17  ' ALKPHOS 113 104 95 96 94  BILITOT 0.7 0.9 0.8 0.9 0.9  PROT 6.4* 6.3* 6.3* 6.2* 6.3*  ALBUMIN 2.6*  2.5*  2.4* 2.7* 2.7* 2.7*    No results for input(s): "LIPASE", "AMYLASE" in the last 168 hours.  Recent Labs  Lab 12/08/21 0538 12/09/21 0523 12/10/21 0623  AMMONIA 44*  63* 42*    Coagulation Profile: Recent Labs  Lab 12/08/21 1026  INR 1.4*    Cardiac Enzymes: No results for input(s): "CKTOTAL", "CKMB", "CKMBINDEX", "TROPONINI" in the last 168 hours. BNP (last 3 results) No results for input(s): "PROBNP" in the last 8760 hours. HbA1C: No results for input(s): "HGBA1C" in the last 72 hours. CBG: Recent Labs  Lab 12/09/21 1159 12/09/21 1646 12/09/21 2002 12/10/21 0804 12/10/21 1149  GLUCAP 94 89 138* 136* 130*    Lipid Profile: No results for input(s): "CHOL", "HDL", "LDLCALC", "TRIG", "CHOLHDL", "LDLDIRECT" in the last 72 hours. Thyroid Function Tests: No results for input(s): "TSH", "T4TOTAL", "FREET4", "T3FREE", "THYROIDAB" in the last 72 hours. Anemia Panel: Recent Labs    12/09/21 0523 12/10/21 0623  FERRITIN 41 26    Sepsis Labs: No results for input(s): "PROCALCITON", "LATICACIDVEN" in the last 168 hours.   Recent Results (from the past 240 hour(s))  SARS Coronavirus 2 by RT PCR (hospital order, performed in [facility] hospital lab) *cepheid single result test* Anterior Nasal Swab     Status: Abnormal   Collection Time: 12/01/21 12:12 PM   Specimen: Anterior Nasal Swab  Result Value Ref Range Status   SARS Coronavirus 2 by RT PCR POSITIVE (A) NEGATIVE Final    Comment: (NOTE) SARS-CoV-2 target nucleic acids are DETECTED  SARS-CoV-2 RNA is generally detectable in upper respiratory specimens  during the acute phase of infection.  Positive results are indicative  of the presence of the identified virus, but do not rule out bacterial infection or co-infection with other pathogens not detected by the test.  Clinical correlation with patient history and  other diagnostic information is necessary to determine patient infection status.  The expected result is negative.  Fact Sheet for Patients:   https://www.patel.info/   Fact Sheet for Healthcare Providers:   https://hall.com/     This test is not yet approved or cleared by the [place] FDA and  has been authorized for detection and/or diagnosis of SARS-CoV-2 by FDA under an Emergency Use Authorization (EUA).  This EUA will remain in effect (meaning this test can be used) for the duration of  the COVID-19 declaration under Section 564(b)(1)  of the Act, 21 U.S.C. section 360-bbb-3(b)(1), unless the authorization is terminated or revoked sooner.   Performed at [facility], [place] [address], [place], [place] [address]   Fungus Culture With Stain     Status: None (Preliminary result)   Collection Time: 12/01/21  4:27 PM   Specimen: Abdomen; Peritoneal Fluid  Result Value Ref Range Status   Fungus Stain Final report  Final    Comment: (NOTE) Performed At: [facility] [address] [place], [place] [id number] Rush Farmer MD DX:[id number]    Fungus (Mycology) Culture PENDING  Incomplete   Fungal Source PERITONEAL  Final    Comment: Performed at [facility], [place] [address], [place], [place] [address]  Acid Fast Smear (AFB)     Status: None   Collection Time: 12/01/21  4:27 PM   Specimen: Abdomen; Peritoneal Fluid  Result Value Ref Range Status   AFB Specimen Processing Concentration  Final   Acid Fast Smear Negative  Final    Comment: (NOTE) Performed At: [place], [place]  [id number] Rush Farmer MD YC:[id number]    Source (AFB) PERITONEAL  Final    Comment: Performed at [facility], [place] [address] [place], [place] [address]  Fungus Culture Result     Status: None   Collection Time: 12/01/21  4:27 PM  Result Value Ref Range Status   Result 1 Comment  Final    Comment: (NOTE) KOH/Calcofluor preparation:  no fungus observed. Performed At: [facility] [place], [place] [id number] Rush Farmer MD YD:[id number]   Culture, blood (Routine X 2) w Reflex to ID Panel      Status: None   Collection Time: 12/01/21  4:52 PM   Specimen: BLOOD  Result Value Ref Range Status   Specimen Description   Final    BLOOD SITE NOT SPECIFIED Performed at [place] [address], [place], [place] [address]    Special Requests   Final    BOTTLES DRAWN AEROBIC AND ANAEROBIC Blood Culture results may not be optimal due to an inadequate volume of blood received in culture bottles Performed at [place] [address] [place], [place] [address]    Culture   Final    NO GROWTH 5 DAYS Performed at [facility], [place] [address] [place], [place] [address]    Report Status 12/06/2021 FINAL  Final  Culture, blood (Routine X 2) w Reflex to ID Panel     Status: None   Collection Time: 12/01/21  8:10 PM   Specimen: BLOOD  Result Value Ref Range Status   Specimen Description   Final    BLOOD BLOOD RIGHT HAND Performed at [place] [address], [place], [place] [address]    Special Requests   Final    IN PEDIATRIC BOTTLE Blood Culture results may not be optimal due to an excessive volume of blood received in culture bottles Performed at [place] [address], [place], [place] [address]    Culture   Final    NO GROWTH 5 DAYS Performed at [facility], [place] [address] [place], [place] [address]    Report Status 12/06/2021 FINAL  Final  Culture, body fluid w Gram Stain-bottle     Status: None   Collection Time: 12/01/21  9:41 PM   Specimen: Peritoneal Washings  Result Value Ref Range Status   Specimen Description PERITONEAL  Final   Special Requests NONE  Final   Culture   Final    NO GROWTH 5 DAYS Performed at [place] [address], [place], [place] [address]    Report Status 12/06/2021 FINAL  Final  Gram stain     Status: None   Collection Time: 12/01/21  9:41 PM   Specimen: Peritoneal Washings  Result Value Ref Range Status   Specimen  Description PERITONEAL  Final   Special Requests NONE  Final   Gram Stain   Final    NO WBC SEEN NO ORGANISMS SEEN Performed at [facility], [address] [place], [place] [address]    Report Status 12/01/2021 FINAL  Final         Radiology Studies: US BIOPSY (LIVER)  Result Date: 12/09/2021 INDICATION: 71 year old female referred for medical liver biopsy, cirrhosis EXAM: ULTRASOUND-GUIDED MEDICAL LIVER BIOPSY MEDICATIONS: None. ANESTHESIA/SEDATION: Moderate (conscious) sedation was employed during this procedure. A total of Versed 2.0 mg and Fentanyl 100 mcg was administered intravenously by the radiology nurse. Total intra-service  moderate Sedation Time: 14 minutes. The patient's level of consciousness and vital signs were monitored continuously by radiology nursing throughout the procedure under my direct supervision. COMPLICATIONS: None PROCEDURE: Informed written consent was obtained from the patient via [name] interpretation after a thorough discussion of the procedural risks, benefits and alternatives. All questions were addressed. Maximal Sterile Barrier Technique was utilized including caps, mask, sterile gowns, sterile gloves, sterile drape, hand hygiene and skin antiseptic. A timeout was performed prior to the initiation of the procedure. Ultrasound survey of the right liver lobe performed with images stored and sent to PACs. The subxiphoid region was prepped with chlorhexidine in a sterile fashion, and a sterile drape was applied covering the operative field. A sterile gown and sterile gloves were used for the procedure. Local anesthesia was provided with 1% Lidocaine. The patient was prepped and draped sterilely and the skin and subcutaneous tissues were generously infiltrated with 1% lidocaine. A 17 gauge introducer needle was then advanced under ultrasound guidance into the left liver via subxiphoid location, avoiding the ascites. The stylet was removed, and multiple separate  18 gauge core biopsy were retrieved. Samples were placed into formalin for transportation to the lab. Gel-Foam pledgets were then infused with a small amount of saline for assistance with hemostasis. The needle was removed, and a final ultrasound image was performed. The patient tolerated the procedure well and remained hemodynamically stable throughout. No complications were encountered and no significant blood loss was encounter. IMPRESSION: Status post medical liver biopsy, left liver lobe. Signed, Dulcy Fanny. Dellia Nims, RPVI [facility] Radiology Electronically Signed   By: Corrie Mckusick D.O.   On: 12/09/2021 17:22        Scheduled Meds:  amLODipine  10 mg Oral Daily   vitamin C  500 mg Oral Daily   docusate sodium  100 mg Oral BID   folic acid  1 mg Oral Daily   heparin injection (subcutaneous)  5,000 Units Subcutaneous Q8H   hydrALAZINE  25 mg Oral Q8H   insulin aspart  0-15 Units Subcutaneous TID WC   insulin aspart  0-5 Units Subcutaneous QHS   levothyroxine  25 mcg Oral Daily   melatonin  3 mg Oral Once   metoprolol succinate  50 mg Oral Daily   multivitamin with minerals  1 tablet Oral Daily   pantoprazole  40 mg Oral BID   thiamine  100 mg Oral Daily   zinc sulfate  220 mg Oral Daily   Continuous Infusions:     LOS: 9 days    Time spent:40 min    Kai Calico, Geraldo Docker, MD [facility]   If 7PM-7AM, please contact night-coverage 12/10/2021, 2:21 PM

## 2021-12-10 NOTE — Progress Notes (Signed)
BLE venous duplex has been completed.   Results can be found under chart review under CV PROC. 12/10/2021 5:21 PM Copeland Neisen RVT, RDMS

## 2021-12-10 NOTE — Progress Notes (Signed)
PROGRESS NOTE    Stacey Chambers  UYE:[id number] DOB: 1951-01-11 DOA: 12/01/2021 PCP: Trey Sailors, PA     Brief Narrative:  71 y.o. HF ([name] speaker) PMHx chronic iron deficiency anemia, diabetes mellitus type 2, hypertension, hyperlipidemia, hypothyroidism, history of CKD stage IIIb as well as other comorbidities who presents with worsening lower extremity swelling and elevated blood pressure.  Patient states that she started feeling fatigued and tired since Friday of last week is noticed that her legs started having worsening swelling.  She states that whenever she ambulates she gets very dyspneic on exertion and very fatigued.  States that she denies any abdominal pain and denies any cough, chest pain or lightheadedness or dizziness.  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  She had lower extremity swelling and given her dyspnea on exertion we will obtain an echocardiogram and evaluate for heart failure.  She is also noted to have AKI on CKD stage IIIb.  Incidentally she was found to be COVID-positive.  Gastroenterology was consulted and [facility] was asked to admit this patient for lower extremity swelling.  Interventional radiology was consulted for a paracentesis and this has been done and she underwent a diagnostic tap.   **Interim History  Her edema has improved however renal function is worsened.  She is not complaining of any chest pain or shortness of breath.  Abdomen is doing okay today.  She has been consulted and doing further work-up and nephrology is also been consulted given her worsening renal function   Nephrology has placed the patient on low-dose IV fluids at 65 MLS per hour but now have reduced it to 40 mL/hour now that creatinine is leveling off.  Chest x-ray done today and "New symmetric airspace disease, most  likely due to pulmonary edema. Suspect small layering bilateral pleural effusions" to keep a close eye on this.   Nephrology evaluated and given the emergence of vascular congestion on chest x-ray there is discontinued IV fluids at this time and agreed to give her a trial of albumin and Lasix now to help augment diuresis.  After the Lasix she felt a little dizzy though   Subjective: 6/10 afebrile overnight, A/O x4.  Sitting in bed comfortably negative SOB, negative abdominal pain.   Assessment & Plan: Covid vaccination;   Principal Problem:   Cirrhosis ([place]) Active Problems:   History of gastric ulcer   History of thrombocytopenia   History of iron deficiency anemia   Gastric ulcer   Dyspnea on exertion   Swelling of lower extremity   Acute kidney injury superimposed on CKD ([place])   Diabetes mellitus type 2 with complications (HCC)   Acute diastolic CHF (congestive heart failure) ([place])   2019 novel coronavirus disease (COVID-19)   Hypertensive urgency   Diabetes mellitus type 2, controlled, without complications ([place])   Acute renal failure superimposed on stage 3b chronic kidney disease (HCC)   Nephrotic syndrome   Normocytic anemia   Low serum albumin   Thrombocytopenia ([place])  New onset Decompensated liver cirrhosis with portal HTN/mild splenomegaly/moderate ascites  -Unclear etiology of her liver cirrhosis; consulted GI for further evaluation  -Acute hepatitis panel negative -Diagnostic paracentesis: Aspirated 1110m sent for pathology: Not consistent with SBP -SAAG > 1.1 consistent with portal hypertension/cirrhosis. -6/8 discussed case with Dr. CCandis SchatzGI plan is for A.m. biopsy of liver - Hx EtOH abuse -  6/2 Echocardiogram EF of 60-65% and G2DD.  See results below -Query EtOH associated cirrhosis.   -Urine protein > 300.  -No focal lesion on unenhanced CT scan.  -AFP WNL -.HBsAg, HCV ab, ANA, AMA and A-1 antitrypsin Negative.  -No evidence for hemochromatosis.  Ceruloplasmin low at 16.4. Serum copper level pending.  -ASMA elevated at 52.  -Negative ANA. IgG pending.   -Total bili 0.8.  Alk phos 95.  AST 30.  ALT 18.  No evidence of hepatic encephalopathy. -Ammonia =44.   -INR/PT. Antimicrosomal Ab -LK. -Calculate MELD score after PT/INR completed  -Eventual 24 hr urine copper to rule out Wilson's disease  -Possible transjugular liver biopsy pending  -2gm low sodium diet   Dyspnea on Exertion r/o CHF (Unclear type) -6/8 resolved  Acute Diastolic CHF -On admission BNP on admission 715 -Strict in and out - Daily weight    Hypertensive Urgency -BP was elevated on Admission -6/9 increase amlodipine 10 mg daily -6/9 Hydralazine IV PRN -6/9 Hydralazine 25 mg TID -6/9 Toprol 50 mg daily    COVID-19 Disease -Currently asymptomatic  COVID-19 Labs  Recent Labs    12/09/21 0523 12/10/21 0623  DDIMER 3.61* 6.87*  FERRITIN 41 26  LDH 122 109  CRP 0.7 0.7     Lab Results  Component Value Date   SARSCOV2NAA POSITIVE (A) 12/01/2021    -SpO2: 100 %; Not requiring any Supplemental O2 via [place] -Airborne and Contract Precautions -Combivent 1 puff IH q6h -Started Molnupiravir 800 mg po BID x5 Days; -Antitussives with Robitussin DM and Tussionex  -Started Steroids Solumedrol 0.5 mg/kg x12 hours for 3 days and then 50 mg po  daily have now stopped since her CRP is not elevated -Start Zinc and Vitamin C  Elevated D-dimer - 6/10 patient with increasing D-dimer and lower extremity pain.  Although may be secondary to poor kidney function will obtain bilateral lower extremity Doppler  Hx of Gastric Ulcer/GERD/GI Prophylaxis -C/w PPI BID   Diabetes Mellitus Type 2 -Hold Home Glipizide -Check HbA1c and was 4.9 -Place on Moderate Novolog SSI AC/HS -Continue to Monitor and Trend Blood Sugars carefully  -CBGs ranging from 106-118   Hypothyroidism -Check TSH was 2.793 -C/w Levothyroxine 25 mcg po Daily   Acute on CKD stage IIIb/Nephrotic  syndrome Lab Results  Component Value Date   CREATININE 4.06 (H) 12/10/2021   CREATININE 4.01 (H) 12/09/2021   CREATININE 3.80 (H) 12/08/2021   CREATININE 3.46 (H) 12/07/2021   CREATININE 3.41 (H) 12/07/2021  -Most likely hepatorenal syndrome -Nephrology managing diuresis - Strict in and out - Daily weight - 6/9 D5-0.9% saline 14ER/XV  Metabolic Acidosis  Hyperphosphatemia -6/11 Will await nephrology recommendation on starting binder  Hyponatremia  -6/7 resolved  Normocytic Anemia -Patient has a history of iron deficiency and take iron supplementation but will hold this for now pending Anemia Panel -Patient's hemoglobin/hematocrit now 10.2/31.3 -> 8.5/25.8 -> 8.4/26.4 -> 8.0/25.3 -> 7.9/24.8 -> 8.9/27.8 -Continue to monitor for signs and symptoms of bleeding; no overt bleeding noted Lab Results  Component Value Date   HGB 8.6 (L) 12/10/2021   HGB 8.7 (L) 12/09/2021   HGB 8.5 (L) 12/08/2021   HGB 8.8 (L) 12/07/2021   HGB 8.9 (L) 12/06/2021     Hypoalbuminemia -Patient's Albumin Level went from 2.5 -> 2.3x2 -> 2.6 -Expected in liver failure   Thrombocytopenia -Mild, negative sign of overt bleeding.  Continue to monitor closely   Goals of care - 6/7 palliative care consult: Multisystem organ failure evaluate  for patient CODE STATUS to DNR, given her serious multiple comorbidities patient requires HCPOA     Mobility Assessment (last 72 hours)     Mobility Assessment   No documentation.             Interdisciplinary Goals of Care Family Meeting   Date carried out: 12/10/2021  Location of the meeting:   Member's involved:   Durable Power of Tour manager:     Discussion: We discussed goals of care for [name] .    Code status:   Disposition:   Time spent for the meeting:     Natonya Finstad J, MD  12/10/2021, 2:52 PM         DVT prophylaxis:  Code Status: Full Family Communication:  Status is:  Inpatient    Dispo: The patient is from: Home              Anticipated d/c is to: Home              Anticipated d/c date is: > 3 days              Patient currently is not medically stable to d/c.      Consultants:  Nephrology GI  Procedures/Significant Events:  6/2 Echocardiogram Left Ventricle: Left ventricular ejection fraction, by estimation, is 60  to 65%. The left ventricle has normal function. The left ventricle has no  regional wall motion abnormalities. The left ventricular internal cavity  size was normal in size. There is   no left ventricular hypertrophy. Left ventricular diastolic parameters  are consistent with Grade II diastolic dysfunction (pseudonormalization).   Right Ventricle: The right ventricular size is normal. Right ventricular  systolic function is normal. There is mildly elevated pulmonary artery  systolic pressure. The tricuspid regurgitant velocity is 3.01 m/s, and  with an assumed right atrial pressure  of 3 mmHg, the estimated right ventricular systolic pressure is 52.0 mmHg.    I have personally reviewed and interpreted all radiology studies and my findings are as above.  VENTILATOR SETTINGS:    Cultures   Antimicrobials: Anti-infectives (From admission, onward)    Start     Ordered Stop   12/01/21 1900  molnupiravir EUA (LAGEVRIO) capsule 800 mg        12/01/21 1546 12/06/21 0904         Devices    LINES / TUBES:      Continuous Infusions:     Objective: Vitals:   12/09/21 2001 12/10/21 0500 12/10/21 0634 12/10/21 1413  BP: (!) 160/76  129/60 138/70  Pulse: 70  71 66  Resp: '18  18 18  ' Temp: 98.5 F (36.9 C)  98.5 F (36.9 C) 98.4 F (36.9 C)  TempSrc: Oral  Oral Oral  SpO2: 99%  100% 100%  Weight:  61.2 kg    Height:        Intake/Output Summary (Last 24 hours) at 12/10/2021 1452 Last data filed at 12/09/2021 2100 Gross per 24 hour  Intake 120 ml  Output 200 ml  Net -80 ml    Filed Weights    12/08/21 0500 12/09/21 0500 12/10/21 0500  Weight: 62.5 kg 65.1 kg 61.2 kg    Examination:  General: A/O x4 No acute respiratory distress Eyes: negative scleral hemorrhage, negative anisocoria, negative icterus ENT: Negative Runny nose, negative gingival bleeding, Neck:  Negative scars, masses, torticollis, lymphadenopathy, JVD Lungs: Clear to auscultation bilaterally without wheezes or crackles Cardiovascular: Regular  rate and rhythm without murmur gallop or rub normal S1 and S2 Abdomen: negative abdominal pain, positive distended (ascites), positive soft, bowel sounds, no rebound, no ascites, no appreciable mass Extremities: No significant cyanosis, clubbing, or edema bilateral lower extremities Skin: Negative rashes, lesions, ulcers Psychiatric:  Negative depression, negative anxiety, negative fatigue, negative mania  Central nervous system:  Cranial nerves II through XII intact, tongue/uvula midline, all extremities muscle strength 5/5, sensation intact throughout,  negative dysarthria, negative expressive aphasia, negative receptive aphasia.  .     Data Reviewed: Care during the described time interval was provided by me .  I have reviewed this patient's available data, including medical history, events of note, physical examination, and all test results as part of my evaluation.  CBC: Recent Labs  Lab 12/06/21 0547 12/07/21 0459 12/08/21 0538 12/09/21 0523 12/10/21 0623  WBC 4.7 4.4 3.7* 3.9* 4.5  NEUTROABS 2.6 2.7 2.0 2.1 2.9  HGB 8.9* 8.8* 8.5* 8.7* 8.6*  HCT 27.8* 27.4* 26.3* 26.6* 26.8*  MCV 95.5 96.5 96.3 95.3 95.7  PLT 114* 111* 104* 108* 103*    Basic Metabolic Panel: Recent Labs  Lab 12/06/21 0547 12/07/21 0459 12/07/21 0747 12/08/21 0538 12/09/21 0523 12/10/21 0623  NA 134* 138  137  --  138 140 140  K 4.1 4.0  3.9  --  3.7 3.9 3.9  CL 104 106  105  --  104 107 110  CO2 '23 22  22  ' --  '24 24 23  ' GLUCOSE 100* 100*  98  --  93 90 118*  BUN 60*  59*  59*  --  58* 56* 56*  CREATININE 3.48* 3.46*  3.41*  --  3.80* 4.01* 4.06*  CALCIUM 7.7* 8.1*  8.0*  --  8.3* 8.1* 8.3*  MG 2.0 1.9 2.0 1.9 1.9 2.2  PHOS 4.5 4.8*  4.8* 4.8* 4.8* 4.3 4.6    GFR: Estimated Creatinine Clearance: 10.3 mL/min (A) (by C-G formula based on SCr of 4.06 mg/dL (H)). Liver Function Tests: Recent Labs  Lab 12/06/21 0547 12/07/21 0459 12/08/21 0538 12/09/21 0523 12/10/21 0623  AST 36 '31 30 30 27  ' ALT '21 19 18 17 17  ' ALKPHOS 113 104 95 96 94  BILITOT 0.7 0.9 0.8 0.9 0.9  PROT 6.4* 6.3* 6.3* 6.2* 6.3*  ALBUMIN 2.6* 2.5*  2.4* 2.7* 2.7* 2.7*    No results for input(s): "LIPASE", "AMYLASE" in the last 168 hours.  Recent Labs  Lab 12/08/21 0538 12/09/21 0523 12/10/21 0623  AMMONIA 44* 63* 42*    Coagulation Profile: Recent Labs  Lab 12/08/21 1026  INR 1.4*    Cardiac Enzymes: No results for input(s): "CKTOTAL", "CKMB", "CKMBINDEX", "TROPONINI" in the last 168 hours. BNP (last 3 results) No results for input(s): "PROBNP" in the last 8760 hours. HbA1C: No results for input(s): "HGBA1C" in the last 72 hours. CBG: Recent Labs  Lab 12/09/21 1159 12/09/21 1646 12/09/21 2002 12/10/21 0804 12/10/21 1149  GLUCAP 94 89 138* 136* 130*    Lipid Profile: No results for input(s): "CHOL", "HDL", "LDLCALC", "TRIG", "CHOLHDL", "LDLDIRECT" in the last 72 hours. Thyroid Function Tests: No results for input(s): "TSH", "T4TOTAL", "FREET4", "T3FREE", "THYROIDAB" in the last 72 hours. Anemia Panel: Recent Labs    12/09/21 0523 12/10/21 0623  FERRITIN 41 26    Sepsis Labs: No results for input(s): "PROCALCITON", "LATICACIDVEN" in the last 168 hours.   Recent Results (from the past 240 hour(s))  SARS Coronavirus 2 by RT PCR (hospital order,  performed in [facility] hospital lab) *cepheid single result test* Anterior Nasal Swab     Status: Abnormal   Collection Time: 12/01/21 12:12 PM   Specimen: Anterior Nasal Swab  Result Value Ref  Range Status   SARS Coronavirus 2 by RT PCR POSITIVE (A) NEGATIVE Final    Comment: (NOTE) SARS-CoV-2 target nucleic acids are DETECTED  SARS-CoV-2 RNA is generally detectable in upper respiratory specimens  during the acute phase of infection.  Positive results are indicative  of the presence of the identified virus, but do not rule out bacterial infection or co-infection with other pathogens not detected by the test.  Clinical correlation with patient history and  other diagnostic information is necessary to determine patient infection status.  The expected result is negative.  Fact Sheet for Patients:   https://www.patel.info/   Fact Sheet for Healthcare Providers:   https://hall.com/    This test is not yet approved or cleared by the [place] FDA and  has been authorized for detection and/or diagnosis of SARS-CoV-2 by FDA under an Emergency Use Authorization (EUA).  This EUA will remain in effect (meaning this test can be used) for the duration of  the COVID-19 declaration under Section 564(b)(1)  of the Act, 21 U.S.C. section 360-bbb-3(b)(1), unless the authorization is terminated or revoked sooner.   Performed at [facility], [place] [address] [place], [place] [address]   Fungus Culture With Stain     Status: None (Preliminary result)   Collection Time: 12/01/21  4:27 PM   Specimen: Abdomen; Peritoneal Fluid  Result Value Ref Range Status   Fungus Stain Final report  Final    Comment: (NOTE) Performed At: [facility] [address], [place] [id number] Rush Farmer MD [id number]    Fungus (Mycology) Culture PENDING  Incomplete   Fungal Source PERITONEAL  Final    Comment: Performed at [facility], [place] [address] [place], [place] [address]  Acid Fast Smear (AFB)     Status: None   Collection Time: 12/01/21  4:27 PM   Specimen: Abdomen; Peritoneal Fluid  Result  Value Ref Range Status   AFB Specimen Processing Concentration  Final   Acid Fast Smear Negative  Final    Comment: (NOTE) Performed At: [facility] [place], [place] [id number] Rush Farmer MD [id number]    Source (AFB) PERITONEAL  Final    Comment: Performed at [facility], [place] [address], [place], [place] [address]  Fungus Culture Result     Status: None   Collection Time: 12/01/21  4:27 PM  Result Value Ref Range Status   Result 1 Comment  Final    Comment: (NOTE) KOH/Calcofluor preparation:  no fungus observed. Performed At: [facility] [place], [place] [id number] Rush Farmer MD [id number]   Culture, blood (Routine X 2) w Reflex to ID Panel     Status: None   Collection Time: 12/01/21  4:52 PM   Specimen: BLOOD  Result Value Ref Range Status   Specimen Description   Final    BLOOD SITE NOT SPECIFIED Performed at [place] [address], [place], [place] [address]    Special Requests   Final    BOTTLES DRAWN AEROBIC AND ANAEROBIC Blood Culture results may not be optimal due to an inadequate volume of blood received in culture bottles Performed at [place] [address] [place], [place] [address]    Culture  Final    NO GROWTH 5 DAYS Performed at [facility], [place] [address], [place], [place] [address]    Report Status 12/06/2021 FINAL  Final  Culture, blood (Routine X 2) w Reflex to ID Panel     Status: None   Collection Time: 12/01/21  8:10 PM   Specimen: BLOOD  Result Value Ref Range Status   Specimen Description   Final    BLOOD BLOOD RIGHT HAND Performed at [place] [address], [place], [place] [address]    Special Requests   Final    IN PEDIATRIC BOTTLE Blood Culture results may not be optimal due to an excessive volume of blood received in culture bottles Performed at [place] [address] [place], [place] [address]    Culture   Final    NO GROWTH 5 DAYS Performed at [facility] [address], [place], [place] [address]    Report Status 12/06/2021 FINAL  Final  Culture, body fluid w Gram Stain-bottle     Status: None   Collection Time: 12/01/21  9:41 PM   Specimen: Peritoneal Washings  Result Value Ref Range Status   Specimen Description PERITONEAL  Final   Special Requests NONE  Final   Culture   Final    NO GROWTH 5 DAYS Performed at [place] [address], [place], [place] [address]    Report Status 12/06/2021 FINAL  Final  Gram stain     Status: None   Collection Time: 12/01/21  9:41 PM   Specimen: Peritoneal Washings  Result Value Ref Range Status   Specimen Description PERITONEAL  Final   Special Requests NONE  Final   Gram Stain   Final    NO WBC SEEN NO ORGANISMS SEEN Performed at [facility], [address] [place], [place] [address]    Report Status 12/01/2021 FINAL  Final         Radiology Studies: US BIOPSY (LIVER)  Result Date: 12/09/2021 INDICATION: 71 year old female referred for medical liver biopsy, cirrhosis EXAM: ULTRASOUND-GUIDED MEDICAL LIVER BIOPSY MEDICATIONS: None. ANESTHESIA/SEDATION: Moderate (conscious) sedation was employed during this procedure. A total of Versed 2.0 mg and Fentanyl 100 mcg was administered intravenously by the radiology nurse. Total intra-service moderate Sedation Time: 14 minutes. The patient's level of consciousness and vital signs were monitored continuously by radiology nursing throughout the procedure under my direct supervision. COMPLICATIONS: None PROCEDURE: Informed written consent was obtained from the patient via [name] interpretation after a thorough discussion of the procedural risks, benefits and alternatives. All questions were addressed. Maximal Sterile Barrier Technique was utilized including caps, mask, sterile gowns, sterile gloves, sterile drape, hand  hygiene and skin antiseptic. A timeout was performed prior to the initiation of the procedure. Ultrasound survey of the right liver lobe performed with images stored and sent to PACs. The subxiphoid region was prepped with chlorhexidine in a sterile fashion, and a sterile drape was applied covering the operative field. A sterile gown and sterile gloves were used for the procedure. Local anesthesia was provided with 1% Lidocaine. The patient was prepped and draped sterilely and the skin and subcutaneous tissues were generously infiltrated with 1% lidocaine. A 17 gauge introducer needle was then advanced under ultrasound guidance into the left liver via subxiphoid location, avoiding the ascites. The stylet was removed, and multiple separate 18 gauge core biopsy were retrieved. Samples were placed into formalin for transportation to the lab.  Gel-Foam pledgets were then infused with a small amount of saline for assistance with hemostasis. The needle was removed, and a final ultrasound image was performed. The patient tolerated the procedure well and remained hemodynamically stable throughout. No complications were encountered and no significant blood loss was encounter. IMPRESSION: Status post medical liver biopsy, left liver lobe. Signed, Dulcy Fanny. Dellia Nims, RPVI [facility] Electronically Signed   By: Corrie Mckusick D.O.   On: 12/09/2021 17:22        Scheduled Meds:  amLODipine  10 mg Oral Daily   vitamin C  500 mg Oral Daily   docusate sodium  100 mg Oral BID   folic acid  1 mg Oral Daily   heparin injection (subcutaneous)  5,000 Units Subcutaneous Q8H   hydrALAZINE  25 mg Oral Q8H   insulin aspart  0-15 Units Subcutaneous TID WC   insulin aspart  0-5 Units Subcutaneous QHS   levothyroxine  25 mcg Oral Daily   melatonin  3 mg Oral Once   metoprolol succinate  50 mg Oral Daily   multivitamin with minerals  1 tablet Oral Daily   pantoprazole   40 mg Oral BID   thiamine  100 mg Oral Daily   zinc sulfate  220 mg Oral Daily   Continuous Infusions:     LOS: 9 days    Time spent:40 min    Naseem Adler, Geraldo Docker, MD [facility]   If 7PM-7AM, please contact night-coverage 12/10/2021, 2:52 PM

## 2021-12-11 ENCOUNTER — Encounter (HOSPITAL_COMMUNITY): Payer: Self-pay | Admitting: Internal Medicine

## 2021-12-11 DIAGNOSIS — K7290 Hepatic failure, unspecified without coma: Secondary | ICD-10-CM

## 2021-12-11 DIAGNOSIS — E118 Type 2 diabetes mellitus with unspecified complications: Secondary | ICD-10-CM | POA: Diagnosis not present

## 2021-12-11 DIAGNOSIS — N289 Disorder of kidney and ureter, unspecified: Secondary | ICD-10-CM | POA: Diagnosis not present

## 2021-12-11 DIAGNOSIS — K7031 Alcoholic cirrhosis of liver with ascites: Secondary | ICD-10-CM | POA: Diagnosis not present

## 2021-12-11 DIAGNOSIS — R601 Generalized edema: Secondary | ICD-10-CM

## 2021-12-11 DIAGNOSIS — R760 Raised antibody titer: Secondary | ICD-10-CM

## 2021-12-11 DIAGNOSIS — N179 Acute kidney failure, unspecified: Secondary | ICD-10-CM | POA: Diagnosis not present

## 2021-12-11 DIAGNOSIS — I10 Essential (primary) hypertension: Secondary | ICD-10-CM | POA: Diagnosis not present

## 2021-12-11 LAB — LACTATE DEHYDROGENASE: LDH: 110 U/L (ref 98–192)

## 2021-12-11 LAB — CBC WITH DIFFERENTIAL/PLATELET
Abs Immature Granulocytes: 0.01 10*3/uL (ref 0.00–0.07)
Basophils Absolute: 0.1 10*3/uL (ref 0.0–0.1)
Basophils Relative: 1 %
Eosinophils Absolute: 0.1 10*3/uL (ref 0.0–0.5)
Eosinophils Relative: 3 %
HCT: 26.1 % — ABNORMAL LOW (ref 36.0–46.0)
Hemoglobin: 8.3 g/dL — ABNORMAL LOW (ref 12.0–15.0)
Immature Granulocytes: 0 %
Lymphocytes Relative: 20 %
Lymphs Abs: 1 10*3/uL (ref 0.7–4.0)
MCH: 30.7 pg (ref 26.0–34.0)
MCHC: 31.8 g/dL (ref 30.0–36.0)
MCV: 96.7 fL (ref 80.0–100.0)
Monocytes Absolute: 0.6 10*3/uL (ref 0.1–1.0)
Monocytes Relative: 12 %
Neutro Abs: 3.3 10*3/uL (ref 1.7–7.7)
Neutrophils Relative %: 64 %
Platelets: 97 10*3/uL — ABNORMAL LOW (ref 150–400)
RBC: 2.7 MIL/uL — ABNORMAL LOW (ref 3.87–5.11)
RDW: 13.8 % (ref 11.5–15.5)
WBC: 5.1 10*3/uL (ref 4.0–10.5)
nRBC: 0 % (ref 0.0–0.2)

## 2021-12-11 LAB — COMPREHENSIVE METABOLIC PANEL
ALT: 17 U/L (ref 0–44)
AST: 26 U/L (ref 15–41)
Albumin: 2.9 g/dL — ABNORMAL LOW (ref 3.5–5.0)
Alkaline Phosphatase: 96 U/L (ref 38–126)
Anion gap: 9 (ref 5–15)
BUN: 61 mg/dL — ABNORMAL HIGH (ref 8–23)
CO2: 21 mmol/L — ABNORMAL LOW (ref 22–32)
Calcium: 8.3 mg/dL — ABNORMAL LOW (ref 8.9–10.3)
Chloride: 109 mmol/L (ref 98–111)
Creatinine, Ser: 4.71 mg/dL — ABNORMAL HIGH (ref 0.44–1.00)
GFR, Estimated: 10 mL/min — ABNORMAL LOW (ref >60)
Glucose, Bld: 132 mg/dL — ABNORMAL HIGH (ref 70–99)
Potassium: 4.2 mmol/L (ref 3.5–5.1)
Sodium: 139 mmol/L (ref 135–145)
Total Bilirubin: 0.7 mg/dL (ref 0.3–1.2)
Total Protein: 6.5 g/dL (ref 6.5–8.1)

## 2021-12-11 LAB — MISC LABCORP TEST (SEND OUT): Labcorp test code: 9985

## 2021-12-11 LAB — D-DIMER, QUANTITATIVE: D-Dimer, Quant: 6.6 ug/mL-FEU — ABNORMAL HIGH (ref 0.00–0.50)

## 2021-12-11 LAB — AMMONIA: Ammonia: 114 umol/L — ABNORMAL HIGH (ref 9–35)

## 2021-12-11 LAB — MAGNESIUM: Magnesium: 2.1 mg/dL (ref 1.7–2.4)

## 2021-12-11 LAB — C-REACTIVE PROTEIN: CRP: 0.9 mg/dL (ref <1.0)

## 2021-12-11 LAB — GLUCOSE, CAPILLARY
Glucose-Capillary: 114 mg/dL — ABNORMAL HIGH (ref 70–99)
Glucose-Capillary: 105 mg/dL — ABNORMAL HIGH (ref 70–99)
Glucose-Capillary: 109 mg/dL — ABNORMAL HIGH (ref 70–99)
Glucose-Capillary: 121 mg/dL — ABNORMAL HIGH (ref 70–99)

## 2021-12-11 LAB — FERRITIN: Ferritin: 28 ng/mL (ref 11–307)

## 2021-12-11 LAB — PHOSPHORUS: Phosphorus: 4.1 mg/dL (ref 2.5–4.6)

## 2021-12-11 NOTE — Progress Notes (Signed)
PROGRESS NOTE    Stacey Chambers  HOZ:[id number] DOB: 11/14/1950 DOA: 12/01/2021 PCP: Trey Sailors, PA     Brief Narrative:  71 y.o. HF ([name] speaker) PMHx chronic iron deficiency anemia, diabetes mellitus type 2, hypertension, hyperlipidemia, hypothyroidism, history of CKD stage IIIb as well as other comorbidities who presents with worsening lower extremity swelling and elevated blood pressure.  Patient states that she started feeling fatigued and tired since Friday of last week is noticed that her legs started having worsening swelling.  She states that whenever she ambulates she gets very dyspneic on exertion and very fatigued.  States that she denies any abdominal pain and denies any cough, chest pain or lightheadedness or dizziness.  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  She had lower extremity swelling and given her dyspnea on exertion we will obtain an echocardiogram and evaluate for heart failure.  She is also noted to have AKI on CKD stage IIIb.  Incidentally she was found to be COVID-positive.  Gastroenterology was consulted and [facility] was asked to admit this patient for lower extremity swelling.  Interventional radiology was consulted for a paracentesis and this has been done and she underwent a diagnostic tap.   **Interim History  Her edema has improved however renal function is worsened.  She is not complaining of any chest pain or shortness of breath.  Abdomen is doing okay today.  She has been consulted and doing further work-up and nephrology is also been consulted given her worsening renal function   Nephrology has placed the patient on low-dose IV fluids at 65 MLS per hour but now have reduced it to 40 mL/hour now that creatinine is leveling off.  Chest x-ray done today and "New symmetric airspace disease, most  likely due to pulmonary edema. Suspect small layering bilateral pleural effusions" to keep a close eye on this.   Nephrology evaluated and given the emergence of vascular congestion on chest x-ray there is discontinued IV fluids at this time and agreed to give her a trial of albumin and Lasix now to help augment diuresis.  After the Lasix she felt a little dizzy though   Subjective: 6/11 A/O x4, sitting in bed comfortably.  Negative abdominal pain   Assessment & Plan: Covid vaccination;   Principal Problem:   Cirrhosis ([place]) Active Problems:   History of gastric ulcer   History of thrombocytopenia   History of iron deficiency anemia   Gastric ulcer   Dyspnea on exertion   Swelling of lower extremity   Acute kidney injury superimposed on CKD ([place])   Diabetes mellitus type 2 with complications (HCC)   Acute diastolic CHF (congestive heart failure) ([place])   2019 novel coronavirus disease (COVID-19)   Hypertensive urgency   Diabetes mellitus type 2, controlled, without complications ([place])   Acute renal failure superimposed on stage 3b chronic kidney disease (HCC)   Nephrotic syndrome   Normocytic anemia   Low serum albumin   Thrombocytopenia ([place])  New onset Decompensated liver cirrhosis with portal HTN/mild splenomegaly/moderate ascites  -Unclear etiology of her liver cirrhosis; consulted GI for further evaluation  -Acute hepatitis panel negative -Diagnostic paracentesis: Aspirated 161m sent for pathology: Not consistent with SBP -SAAG > 1.1 consistent with portal hypertension/cirrhosis. -6/8 discussed case with Dr. CCandis SchatzGI plan is for A.m. biopsy of liver - Hx EtOH abuse - 6/2 Echocardiogram EF  of 60-65% and G2DD.  See results below -Query EtOH associated cirrhosis.   -Urine protein > 300.  -No focal lesion on unenhanced CT scan.  -AFP WNL -.HBsAg, HCV ab, ANA, AMA and A-1 antitrypsin Negative.  -No evidence for hemochromatosis. Ceruloplasmin low at 16.4. Serum  copper level pending.  -ASMA elevated at 52.  -Negative ANA. IgG pending.   -Total bili 0.8.  Alk phos 95.  AST 30.  ALT 18.  No evidence of hepatic encephalopathy. -Ammonia =44.   -INR/PT. Antimicrosomal Ab -LK. -Calculate MELD score after PT/INR completed  -Eventual 24 hr urine copper to rule out Wilson's disease  -Possible transjugular liver biopsy pending  -2gm low sodium diet   Dyspnea on Exertion r/o CHF (Unclear type) -6/8 resolved  Acute Diastolic CHF -On admission BNP on admission 715 -Strict in and out +1.4 L - Daily weight    Hypertensive Urgency -BP was elevated on Admission -6/9 increase amlodipine 10 mg daily -6/9 Hydralazine IV PRN -6/9 Hydralazine 25 mg TID -6/9 Toprol 50 mg daily    COVID-19 Disease -Currently asymptomatic  COVID-19 Labs  Recent Labs    12/09/21 0523 12/10/21 0623 12/11/21 0611  DDIMER 3.61* 6.87* 6.60*  FERRITIN 41 26 28  LDH 122 109 110  CRP 0.7 0.7  --      Lab Results  Component Value Date   SARSCOV2NAA POSITIVE (A) 12/01/2021    -SpO2: 100 %; Not requiring any Supplemental O2 via [place] -Airborne and Contract Precautions -Combivent 1 puff IH q6h -Started Molnupiravir 800 mg po BID x5 Days; -Antitussives with Robitussin DM and Tussionex  -Started Steroids Solumedrol 0.5 mg/kg x12 hours for 3 days and then 50 mg po  daily have now stopped since her CRP is not elevated -Start Zinc and Vitamin C -6/11 patient now off isolation  Elevated D-dimer - 6/10 patient with increasing D-dimer and lower extremity pain.  Although may be secondary to poor kidney function will obtain bilateral lower extremity Doppler  Hx of Gastric Ulcer/GERD/GI Prophylaxis -C/w PPI BID   Diabetes Mellitus Type 2 -Hold Home Glipizide -Check HbA1c and was 4.9 -Place on Moderate Novolog SSI AC/HS -Continue to Monitor and Trend Blood Sugars carefully  -CBGs ranging from 106-118   Hypothyroidism -Check TSH was 2.793 -C/w Levothyroxine 25 mcg po  Daily   Acute on CKD stage IIIb/Nephrotic syndrome Lab Results  Component Value Date   CREATININE 4.71 (H) 12/11/2021   CREATININE 4.06 (H) 12/10/2021   CREATININE 4.01 (H) 12/09/2021   CREATININE 3.80 (H) 12/08/2021   CREATININE 3.46 (H) 12/07/2021   CREATININE 3.41 (H) 12/07/2021  -Most likely hepatorenal syndrome -Nephrology managing diuresis - Strict in and out - Daily weight - 6/9 D5-0.9% saline 94m/hr -6/11 plan is for nephrology to biopsy kidney in the A.m. patient understands she may be heading toward HD  Metabolic Acidosis -69/40resolved  Hyperphosphatemia -6/11 Will await nephrology recommendation on starting binder  Hyponatremia  -6/7 resolved  Normocytic Anemia -Patient has a history of iron deficiency and take iron supplementation but will hold this for now pending Anemia Panel -Patient's hemoglobin/hematocrit now 10.2/31.3 -> 8.5/25.8 -> 8.4/26.4 -> 8.0/25.3 -> 7.9/24.8 -> 8.9/27.8 -Continue to monitor for signs and symptoms of bleeding; no overt bleeding noted Lab Results  Component Value Date   HGB 8.3 (L) 12/11/2021   HGB 8.6 (L) 12/10/2021   HGB 8.7 (L) 12/09/2021   HGB 8.5 (L) 12/08/2021   HGB 8.8 (L) 12/07/2021     Hypoalbuminemia -Patient's Albumin Level  went from 2.5 -> 2.3x2 -> 2.6 -Expected in liver failure   Thrombocytopenia -Mild, negative sign of overt bleeding.  Continue to monitor closely   Goals of care - 6/7 palliative care consult: Multisystem organ failure evaluate for patient CODE STATUS to DNR, given her serious multiple comorbidities patient requires HCPOA     Mobility Assessment (last 72 hours)     Mobility Assessment     Row Name 12/10/21 2200 12/10/21 1515         Does patient have an order for bedrest or is patient medically unstable No - Continue assessment No - Continue assessment      What is the highest level of mobility based on the progressive mobility assessment? Level 6 (Walks independently in room and  hall) - Balance while walking in room without assist - Complete Level 5 (Walks with assist in room/hall) - Balance while stepping forward/back and can walk in room with assist - Complete                 Interdisciplinary Goals of Care Family Meeting   Date carried out: 12/11/2021  Location of the meeting:   Member's involved:   Durable Power of Tour manager:     Discussion: We discussed goals of care for [name] .    Code status:   Disposition:   Time spent for the meeting:     Allie Bossier, MD  12/11/2021, 10:46 AM         DVT prophylaxis:  Code Status: Full Family Communication:  Status is: Inpatient    Dispo: The patient is from: Home              Anticipated d/c is to: Home              Anticipated d/c date is: > 3 days              Patient currently is not medically stable to d/c.      Consultants:  Nephrology GI  Procedures/Significant Events:  6/2 Echocardiogram Left Ventricle: Left ventricular ejection fraction, by estimation, is 60  to 65%. The left ventricle has normal function. The left ventricle has no  regional wall motion abnormalities. The left ventricular internal cavity  size was normal in size. There is   no left ventricular hypertrophy. Left ventricular diastolic parameters  are consistent with Grade II diastolic dysfunction (pseudonormalization).    I have personally reviewed and interpreted all radiology studies and my findings are as above.  VENTILATOR SETTINGS:    Cultures   Antimicrobials: Anti-infectives (From admission, onward)    Start     Ordered Stop   12/01/21 1900  molnupiravir EUA (LAGEVRIO) capsule 800 mg        12/01/21 1546 12/06/21 0904         Devices    LINES / TUBES:      Continuous Infusions:     Objective: Vitals:   12/10/21 1413 12/10/21 2134 12/11/21 0500 12/11/21 0550  BP: 138/70 123/71  121/63  Pulse: 66 70  72  Resp: '18 18   18  ' Temp: 98.4 F (36.9 C) 98.3 F (36.8 C)  98.8 F (37.1 C)  TempSrc: Oral Oral  Oral  SpO2: 100% 100%  97%  Weight:   62.6 kg   Height:        Intake/Output Summary (Last 24 hours) at 12/11/2021 1046 Last data filed at 12/10/2021 1133 Gross per 24 hour  Intake 0 ml  Output --  Net 0 ml    Filed Weights   12/09/21 0500 12/10/21 0500 12/11/21 0500  Weight: 65.1 kg 61.2 kg 62.6 kg    Examination:  General: A/O x4 No acute respiratory distress Eyes: negative scleral hemorrhage, negative anisocoria, negative icterus ENT: Negative Runny nose, negative gingival bleeding, Neck:  Negative scars, masses, torticollis, lymphadenopathy, JVD Lungs: Clear to auscultation bilaterally without wheezes or crackles Cardiovascular: Regular rate and rhythm without murmur gallop or rub normal S1 and S2 Abdomen: negative abdominal pain, positive distended (ascites), positive soft, bowel sounds, no rebound, no ascites, no appreciable mass Extremities: No significant cyanosis, clubbing, or edema bilateral lower extremities Skin: Negative rashes, lesions, ulcers Psychiatric:  Negative depression, negative anxiety, negative fatigue, negative mania  Central nervous system:  Cranial nerves II through XII intact, tongue/uvula midline, all extremities muscle strength 5/5, sensation intact throughout,  negative dysarthria, negative expressive aphasia, negative receptive aphasia.  .     Data Reviewed: Care during the described time interval was provided by me .  I have reviewed this patient's available data, including medical history, events of note, physical examination, and all test results as part of my evaluation.  CBC: Recent Labs  Lab 12/07/21 0459 12/08/21 0538 12/09/21 0523 12/10/21 0623 12/11/21 0611  WBC 4.4 3.7* 3.9* 4.5 5.1  NEUTROABS 2.7 2.0 2.1 2.9 3.3  HGB 8.8* 8.5* 8.7* 8.6* 8.3*  HCT 27.4* 26.3* 26.6* 26.8* 26.1*  MCV 96.5 96.3 95.3 95.7 96.7  PLT 111* 104* 108* 103* 97*     Basic Metabolic Panel: Recent Labs  Lab 12/07/21 0459 12/07/21 0747 12/08/21 0538 12/09/21 0523 12/10/21 0623 12/11/21 0611  NA 138  137  --  138 140 140 139  K 4.0  3.9  --  3.7 3.9 3.9 4.2  CL 106  105  --  104 107 110 109  CO2 22  22  --  '24 24 23 ' 21*  GLUCOSE 100*  98  --  93 90 118* 132*  BUN 59*  59*  --  58* 56* 56* 61*  CREATININE 3.46*  3.41*  --  3.80* 4.01* 4.06* 4.71*  CALCIUM 8.1*  8.0*  --  8.3* 8.1* 8.3* 8.3*  MG 1.9 2.0 1.9 1.9 2.2 2.1  PHOS 4.8*  4.8* 4.8* 4.8* 4.3 4.6 4.1    GFR: Estimated Creatinine Clearance: 9 mL/min (A) (by C-G formula based on SCr of 4.71 mg/dL (H)). Liver Function Tests: Recent Labs  Lab 12/07/21 0459 12/08/21 0538 12/09/21 0523 12/10/21 0623 12/11/21 0611  AST '31 30 30 27 26  ' ALT '19 18 17 17 17  ' ALKPHOS 104 95 96 94 96  BILITOT 0.9 0.8 0.9 0.9 0.7  PROT 6.3* 6.3* 6.2* 6.3* 6.5  ALBUMIN 2.5*  2.4* 2.7* 2.7* 2.7* 2.9*    No results for input(s): "LIPASE", "AMYLASE" in the last 168 hours.  Recent Labs  Lab 12/08/21 0538 12/09/21 0523 12/10/21 0623 12/11/21 0611  AMMONIA 44* 63* 42* 114*    Coagulation Profile: Recent Labs  Lab 12/08/21 1026 12/10/21 1621  INR 1.4* 1.3*    Cardiac Enzymes: No results for input(s): "CKTOTAL", "CKMB", "CKMBINDEX", "TROPONINI" in the last 168 hours. BNP (last 3 results) No results for input(s): "PROBNP" in the last 8760 hours. HbA1C: No results for input(s): "HGBA1C" in the last 72 hours. CBG: Recent Labs  Lab 12/10/21 0804 12/10/21 1149 12/10/21 1659 12/10/21 2135 12/11/21 0717  GLUCAP 136* 130* 135* 130* 114*  Lipid Profile: No results for input(s): "CHOL", "HDL", "LDLCALC", "TRIG", "CHOLHDL", "LDLDIRECT" in the last 72 hours. Thyroid Function Tests: No results for input(s): "TSH", "T4TOTAL", "FREET4", "T3FREE", "THYROIDAB" in the last 72 hours. Anemia Panel: Recent Labs    12/10/21 0623 12/11/21 0611  FERRITIN 26 28    Sepsis Labs: No  results for input(s): "PROCALCITON", "LATICACIDVEN" in the last 168 hours.   Recent Results (from the past 240 hour(s))  SARS Coronavirus 2 by RT PCR (hospital order, performed in [facility] hospital lab) *cepheid single result test* Anterior Nasal Swab     Status: Abnormal   Collection Time: 12/01/21 12:12 PM   Specimen: Anterior Nasal Swab  Result Value Ref Range Status   SARS Coronavirus 2 by RT PCR POSITIVE (A) NEGATIVE Final    Comment: (NOTE) SARS-CoV-2 target nucleic acids are DETECTED  SARS-CoV-2 RNA is generally detectable in upper respiratory specimens  during the acute phase of infection.  Positive results are indicative  of the presence of the identified virus, but do not rule out bacterial infection or co-infection with other pathogens not detected by the test.  Clinical correlation with patient history and  other diagnostic information is necessary to determine patient infection status.  The expected result is negative.  Fact Sheet for Patients:   https://www.patel.info/   Fact Sheet for Healthcare Providers:   https://hall.com/    This test is not yet approved or cleared by the [place] FDA and  has been authorized for detection and/or diagnosis of SARS-CoV-2 by FDA under an Emergency Use Authorization (EUA).  This EUA will remain in effect (meaning this test can be used) for the duration of  the COVID-19 declaration under Section 564(b)(1)  of the Act, 21 U.S.C. section 360-bbb-3(b)(1), unless the authorization is terminated or revoked sooner.   Performed at [facility], [place] [address], [place], [place] [address]   Fungus Culture With Stain     Status: None (Preliminary result)   Collection Time: 12/01/21  4:27 PM   Specimen: Abdomen; Peritoneal Fluid  Result Value Ref Range Status   Fungus Stain Final report  Final    Comment: (NOTE) Performed At: [facility] [address] [place], [place] [id number] Rush Farmer MD [id number]    Fungus (Mycology) Culture PENDING  Incomplete   Fungal Source PERITONEAL  Final    Comment: Performed at [facility], [place] [address] [place], [place] [address]  Acid Fast Smear (AFB)     Status: None   Collection Time: 12/01/21  4:27 PM   Specimen: Abdomen; Peritoneal Fluid  Result Value Ref Range Status   AFB Specimen Processing Concentration  Final   Acid Fast Smear Negative  Final    Comment: (NOTE) Performed At: [facility] [place], [place] [id number] Rush Farmer MD [id number]    Source (AFB) PERITONEAL  Final    Comment: Performed at [facility], [place] [address], [place], [place] [address]  Fungus Culture Result     Status: None   Collection Time: 12/01/21  4:27 PM  Result Value Ref Range Status   Result 1 Comment  Final    Comment: (NOTE) KOH/Calcofluor preparation:  no fungus observed. Performed At: [facility] [place], [place] [id number] Rush Farmer MD IW:[id number]   Culture, blood (Routine X 2) w Reflex to ID Panel     Status: None   Collection Time: 12/01/21  4:52 PM   Specimen: BLOOD  Result Value Ref Range Status   Specimen Description   Final    BLOOD SITE NOT SPECIFIED Performed at [place] [address], [place], [place] [address]    Special Requests   Final    BOTTLES DRAWN AEROBIC AND ANAEROBIC Blood Culture results may not be optimal due to an inadequate volume of blood received in culture bottles Performed at [facility] [address] [place], [place] [address]    Culture   Final    NO GROWTH 5 DAYS Performed at [facility], [place] [address], [place], [place] [address]    Report Status 12/06/2021 FINAL  Final  Culture, blood (Routine X 2) w Reflex to ID Panel     Status: None   Collection Time: 12/01/21  8:10 PM   Specimen: BLOOD  Result  Value Ref Range Status   Specimen Description   Final    BLOOD BLOOD RIGHT HAND Performed at [facility] [address] [place], [place] [address]    Special Requests   Final    IN PEDIATRIC BOTTLE Blood Culture results may not be optimal due to an excessive volume of blood received in culture bottles Performed at [place] [address], [place], [place] [address]    Culture   Final    NO GROWTH 5 DAYS Performed at [facility], [place] [address] [place], [place] [address]    Report Status 12/06/2021 FINAL  Final  Culture, body fluid w Gram Stain-bottle     Status: None   Collection Time: 12/01/21  9:41 PM   Specimen: Peritoneal Washings  Result Value Ref Range Status   Specimen Description PERITONEAL  Final   Special Requests NONE  Final   Culture   Final    NO GROWTH 5 DAYS Performed at [place] [address], [place], [place] [address]    Report Status 12/06/2021 FINAL  Final  Gram stain     Status: None   Collection Time: 12/01/21  9:41 PM   Specimen: Peritoneal Washings  Result Value Ref Range Status   Specimen Description PERITONEAL  Final   Special Requests NONE  Final   Gram Stain   Final    NO WBC SEEN NO ORGANISMS SEEN Performed at [facility], [address] [place], [place] [address]    Report Status 12/01/2021 FINAL  Final         Radiology Studies: VAS [place] LOWER EXTREMITY VENOUS (DVT)  Result Date: 12/11/2021  Lower Venous DVT Study Patient Name:  [facility]  Date of Exam:   12/10/2021 Medical Rec #: [id number]              Accession #:    [id number] Date of Birth: [birth year]             Patient Gender: F Patient Age:   20 years Exam Location:  [facility] Procedure:      VAS [place] LOWER EXTREMITY VENOUS (DVT) Referring Phys: Vicente Serene Hannelore Bova --------------------------------------------------------------------------------  Indications: Elevated D-dimer in COVID+ patient.   Comparison Study: No previous exams Performing Technologist: Jody Hill RVT, RDMS  Examination Guidelines: A complete evaluation includes B-mode imaging, spectral Doppler, color Doppler, and power Doppler as needed of all accessible portions of each vessel. Bilateral testing is considered an integral part of a complete examination. Limited examinations for reoccurring indications may be performed as noted. The reflux portion of the  exam is performed with the patient in reverse Trendelenburg.  +---------+---------------+---------+-----------+----------+--------------+ RIGHT    CompressibilityPhasicitySpontaneityPropertiesThrombus Aging +---------+---------------+---------+-----------+----------+--------------+ CFV      Full           Yes      Yes                                 +---------+---------------+---------+-----------+----------+--------------+ SFJ      Full                                                        +---------+---------------+---------+-----------+----------+--------------+ FV Prox  Full           Yes      Yes                                 +---------+---------------+---------+-----------+----------+--------------+ FV Mid   Full           Yes      Yes                                 +---------+---------------+---------+-----------+----------+--------------+ FV DistalFull           Yes      Yes                                 +---------+---------------+---------+-----------+----------+--------------+ PFV      Full                                                        +---------+---------------+---------+-----------+----------+--------------+ POP      Full           Yes      Yes                                 +---------+---------------+---------+-----------+----------+--------------+ PTV      Full                                                        +---------+---------------+---------+-----------+----------+--------------+ PERO      Full                                                        +---------+---------------+---------+-----------+----------+--------------+   +---------+---------------+---------+-----------+----------+--------------+ LEFT     CompressibilityPhasicitySpontaneityPropertiesThrombus Aging +---------+---------------+---------+-----------+----------+--------------+ CFV      Full           Yes      Yes                                 +---------+---------------+---------+-----------+----------+--------------+  SFJ      Full                                                        +---------+---------------+---------+-----------+----------+--------------+ FV Prox  Full           Yes      Yes                                 +---------+---------------+---------+-----------+----------+--------------+ FV Mid   Full           Yes      Yes                                 +---------+---------------+---------+-----------+----------+--------------+ FV DistalFull           Yes      Yes                                 +---------+---------------+---------+-----------+----------+--------------+ PFV      Full                                                        +---------+---------------+---------+-----------+----------+--------------+ POP      Full           Yes      Yes                                 +---------+---------------+---------+-----------+----------+--------------+ PTV      Full                                                        +---------+---------------+---------+-----------+----------+--------------+ PERO     Full                                                        +---------+---------------+---------+-----------+----------+--------------+     Summary: BILATERAL: - No evidence of deep vein thrombosis seen in the lower extremities, bilaterally. -No evidence of popliteal cyst, bilaterally. Pulsatile doppler waveforms, bilaterally.   *See table(s)  above for measurements and observations. Electronically signed by Servando Snare MD on 12/11/2021 at 10:07:41 AM.    Final    US BIOPSY (LIVER)  Result Date: 12/09/2021 INDICATION: 72 year old female referred for medical liver biopsy, cirrhosis EXAM: ULTRASOUND-GUIDED MEDICAL LIVER BIOPSY MEDICATIONS: None. ANESTHESIA/SEDATION: Moderate (conscious) sedation was employed during this procedure. A total of Versed 2.0 mg and Fentanyl 100 mcg was administered intravenously by the radiology nurse. Total intra-service moderate Sedation Time: 14 minutes. The patient's level of consciousness and vital signs were monitored continuously by radiology nursing throughout the procedure under  my direct supervision. COMPLICATIONS: None PROCEDURE: Informed written consent was obtained from the patient via [name] interpretation after a thorough discussion of the procedural risks, benefits and alternatives. All questions were addressed. Maximal Sterile Barrier Technique was utilized including caps, mask, sterile gowns, sterile gloves, sterile drape, hand hygiene and skin antiseptic. A timeout was performed prior to the initiation of the procedure. Ultrasound survey of the right liver lobe performed with images stored and sent to PACs. The subxiphoid region was prepped with chlorhexidine in a sterile fashion, and a sterile drape was applied covering the operative field. A sterile gown and sterile gloves were used for the procedure. Local anesthesia was provided with 1% Lidocaine. The patient was prepped and draped sterilely and the skin and subcutaneous tissues were generously infiltrated with 1% lidocaine. A 17 gauge introducer needle was then advanced under ultrasound guidance into the left liver via subxiphoid location, avoiding the ascites. The stylet was removed, and multiple separate 18 gauge core biopsy were retrieved. Samples were placed into formalin for transportation to the lab. Gel-Foam pledgets were then infused with a  small amount of saline for assistance with hemostasis. The needle was removed, and a final ultrasound image was performed. The patient tolerated the procedure well and remained hemodynamically stable throughout. No complications were encountered and no significant blood loss was encounter. IMPRESSION: Status post medical liver biopsy, left liver lobe. Signed, Dulcy Fanny. Dellia Nims, RPVI [facility] Radiology Electronically Signed   By: Corrie Mckusick D.O.   On: 12/09/2021 17:22        Scheduled Meds:  amLODipine  10 mg Oral Daily   vitamin C  500 mg Oral Daily   docusate sodium  100 mg Oral BID   folic acid  1 mg Oral Daily   heparin injection (subcutaneous)  5,000 Units Subcutaneous Q8H   hydrALAZINE  25 mg Oral Q8H   insulin aspart  0-15 Units Subcutaneous TID WC   insulin aspart  0-5 Units Subcutaneous QHS   levothyroxine  25 mcg Oral Daily   melatonin  3 mg Oral Once   metoprolol succinate  50 mg Oral Daily   multivitamin with minerals  1 tablet Oral Daily   pantoprazole  40 mg Oral BID   thiamine  100 mg Oral Daily   zinc sulfate  220 mg Oral Daily   Continuous Infusions:     LOS: 10 days    Time spent:40 min    Alecxander Mainwaring, Geraldo Docker, MD [facility]   If 7PM-7AM, please contact night-coverage 12/11/2021, 10:46 AM

## 2021-12-11 NOTE — Progress Notes (Signed)
Patient ID: Stacey Chambers, female   DOB: 1950/09/02, 71 y.o.   MRN: [id number] [facility] Progress Note   Assessment/ Plan:   1. Acute kidney Injury on CKD Stage IV: Baseline creatinine presumed to be about 2.1 with gradual progression over the past year suspected to be from hypertension given preceding history and presentation with hypertensive urgency.  It appears less likely that she has type II HRS based on presentation and lack of response to earlier therapies.  The presence of RBCs with likely RBC fracture cast seen by Dr. Jonnie Finner on his analysis of the urine sediment raised concern for acute GN however serologies including antinuclear antibody and ANCA were negative.  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  A renal biopsy is required at this point for both definitive diagnosis and prognosis.  I will request for interventional radiology to perform a renal biopsy on Monday 6/12 (and I filled out the [facility] labs pathology requisition form and placed it in the shadow chart).  If renal biopsy cannot be done at [facility], would favor transfer to [facility] as with her rising creatinine, dialysis may indeed be imminent.  I had a lengthy talk with the patient today and explained the situation to her and she is willing to proceed.  She does not have any acute indications for dialysis at this time. 2. 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  Previously with low ceruloplasmin and an elevated anti-smooth muscle antibody with liver biopsy report pending-suspicion for autoimmune hepatitis.  Ongoing 24-hour collection for urine copper. 3. Hypertensive urgency:  blood pressures remain intermittently elevated on low dose amlodipine, metoprolol and PRN hydralazine.  Blood pressures noted to be improving with up titration of amlodipine. 4. 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   Subjective:   Expresses concern about lower extremity weakness and her fear of being on dialysis.  I had an extensive discussion with the patient today via the translator.  She had several days   Objective:   BP 121/63 (BP Location: Left Arm)   Pulse 72   Temp 98.8 F (37.1 C) (Oral)   Resp 18   Ht 4\' 10"  (1.473 m)   Wt 62.6 kg   SpO2 97%   BMI 28.82 kg/m   Intake/Output Summary (Last 24 hours) at 12/11/2021 1039 Last data filed at 12/10/2021 1133 Gross per 24 hour  Intake 0 ml  Output --  Net 0 ml   Weight change: 1.315 kg  Physical Exam: XLK:[id number] resting in bed UYQ:IHKVQ regular rhythm and normal rate, normal s1 and s2 Resp: Clear to auscultation bilaterally, no rales/rhonchi. QVZ:DGLO, slightly distended, non-tender, bowel sounds normal Ext: No lower extremity edema  Imaging: VAS [place] LOWER EXTREMITY VENOUS (DVT)  Result Date: 12/11/2021  Lower Venous DVT Study Patient Name:  [facility]  Date of Exam:   12/10/2021 Medical Rec #: [id number]              Accession #:    [id number] Date of Birth: 01/09/1951             Patient Gender: F Patient Age:   30 years Exam Location:  [facility] Procedure:  VAS [place] LOWER EXTREMITY VENOUS (DVT) Referring Phys: CURTIS WOODS --------------------------------------------------------------------------------  Indications: Elevated D-dimer in COVID+ patient.  Comparison Study: No previous exams Performing Technologist: Jody Hill RVT, RDMS  Examination Guidelines: A complete evaluation includes B-mode imaging, spectral Doppler, color Doppler, and power Doppler as needed of all accessible portions of each vessel. Bilateral testing is  considered an integral part of a complete examination. Limited examinations for reoccurring indications may be performed as noted. The reflux portion of the exam is performed with the patient in reverse Trendelenburg.  +---------+---------------+---------+-----------+----------+--------------+ RIGHT    CompressibilityPhasicitySpontaneityPropertiesThrombus Aging +---------+---------------+---------+-----------+----------+--------------+ CFV      Full           Yes      Yes                                 +---------+---------------+---------+-----------+----------+--------------+ SFJ      Full                                                        +---------+---------------+---------+-----------+----------+--------------+ FV Prox  Full           Yes      Yes                                 +---------+---------------+---------+-----------+----------+--------------+ FV Mid   Full           Yes      Yes                                 +---------+---------------+---------+-----------+----------+--------------+ FV DistalFull           Yes      Yes                                 +---------+---------------+---------+-----------+----------+--------------+ PFV      Full                                                        +---------+---------------+---------+-----------+----------+--------------+ POP      Full           Yes      Yes                                 +---------+---------------+---------+-----------+----------+--------------+ PTV      Full                                                        +---------+---------------+---------+-----------+----------+--------------+ PERO     Full                                                        +---------+---------------+---------+-----------+----------+--------------+   +---------+---------------+---------+-----------+----------+--------------+  LEFT      CompressibilityPhasicitySpontaneityPropertiesThrombus Aging +---------+---------------+---------+-----------+----------+--------------+ CFV      Full           Yes      Yes                                 +---------+---------------+---------+-----------+----------+--------------+ SFJ      Full                                                        +---------+---------------+---------+-----------+----------+--------------+ FV Prox  Full           Yes      Yes                                 +---------+---------------+---------+-----------+----------+--------------+ FV Mid   Full           Yes      Yes                                 +---------+---------------+---------+-----------+----------+--------------+ FV DistalFull           Yes      Yes                                 +---------+---------------+---------+-----------+----------+--------------+ PFV      Full                                                        +---------+---------------+---------+-----------+----------+--------------+ POP      Full           Yes      Yes                                 +---------+---------------+---------+-----------+----------+--------------+ PTV      Full                                                        +---------+---------------+---------+-----------+----------+--------------+ PERO     Full                                                        +---------+---------------+---------+-----------+----------+--------------+     Summary: BILATERAL: - No evidence of deep vein thrombosis seen in the lower extremities, bilaterally. -No evidence of popliteal cyst, bilaterally. Pulsatile doppler waveforms, bilaterally.   *See table(s) above for measurements and observations. Electronically signed by Servando Snare MD on 12/11/2021 at 10:07:41 AM.    Final    US BIOPSY (LIVER)  Result Date: 12/09/2021 INDICATION: 71 year old  female referred for medical liver  biopsy, cirrhosis EXAM: ULTRASOUND-GUIDED MEDICAL LIVER BIOPSY MEDICATIONS: None. ANESTHESIA/SEDATION: Moderate (conscious) sedation was employed during this procedure. A total of Versed 2.0 mg and Fentanyl 100 mcg was administered intravenously by the radiology nurse. Total intra-service moderate Sedation Time: 14 minutes. The patient's level of consciousness and vital signs were monitored continuously by radiology nursing throughout the procedure under my direct supervision. COMPLICATIONS: None PROCEDURE: Informed written consent was obtained from the patient via [name] interpretation after a thorough discussion of the procedural risks, benefits and alternatives. All questions were addressed. Maximal Sterile Barrier Technique was utilized including caps, mask, sterile gowns, sterile gloves, sterile drape, hand hygiene and skin antiseptic. A timeout was performed prior to the initiation of the procedure. Ultrasound survey of the right liver lobe performed with images stored and sent to PACs. The subxiphoid region was prepped with chlorhexidine in a sterile fashion, and a sterile drape was applied covering the operative field. A sterile gown and sterile gloves were used for the procedure. Local anesthesia was provided with 1% Lidocaine. The patient was prepped and draped sterilely and the skin and subcutaneous tissues were generously infiltrated with 1% lidocaine. A 17 gauge introducer needle was then advanced under ultrasound guidance into the left liver via subxiphoid location, avoiding the ascites. The stylet was removed, and multiple separate 18 gauge core biopsy were retrieved. Samples were placed into formalin for transportation to the lab. Gel-Foam pledgets were then infused with a small amount of saline for assistance with hemostasis. The needle was removed, and a final ultrasound image was performed. The patient tolerated the procedure well and remained hemodynamically stable throughout. No complications  were encountered and no significant blood loss was encounter. IMPRESSION: Status post medical liver biopsy, left liver lobe. Signed, Dulcy Fanny. Dellia Nims, RPVI [facility] Radiology Electronically Signed   By: Corrie Mckusick D.O.   On: 12/09/2021 17:22    Labs: BM[facility] Recent Labs  Lab 12/05/21 0501 12/06/21 0547 12/07/21 0459 12/07/21 0747 12/08/21 0538 12/09/21 0523 12/10/21 0623 12/11/21 0611  NA 137 134* 138  137  --  138 140 140 139  K 4.0 4.1 4.0  3.9  --  3.7 3.9 3.9 4.2  CL 111 104 106  105  --  104 107 110 109  CO2 20* 23 22  22   --  24 24 23  21*  GLUCOSE 99 100* 100*  98  --  93 90 118* 132*  BUN 59* 60* 59*  59*  --  58* 56* 56* 61*  CREATININE 3.28* 3.48* 3.46*  3.41*  --  3.80* 4.01* 4.06* 4.71*  CALCIUM 7.7* 7.7* 8.1*  8.0*  --  8.3* 8.1* 8.3* 8.3*  PHOS 4.1 4.5 4.8*  4.8* 4.8* 4.8* 4.3 4.6 4.1   CBC Recent Labs  Lab 12/08/21 0538 12/09/21 0523 12/10/21 0623 12/11/21 0611  WBC 3.7* 3.9* 4.5 5.1  NEUTROABS 2.0 2.1 2.9 3.3  HGB 8.5* 8.7* 8.6* 8.3*  HCT 26.3* 26.6* 26.8* 26.1*  MCV 96.3 95.3 95.7 96.7  PLT 104* 108* 103* 97*   Medications:     amLODipine  10 mg Oral Daily   vitamin C  500 mg Oral Daily   docusate sodium  100 mg Oral BID   folic acid  1 mg Oral Daily   heparin injection (subcutaneous)  5,000 Units Subcutaneous Q8H   hydrALAZINE  25 mg Oral Q8H   insulin aspart  0-15 Units Subcutaneous TID WC  insulin aspart  0-5 Units Subcutaneous QHS   levothyroxine  25 mcg Oral Daily   melatonin  3 mg Oral Once   metoprolol succinate  50 mg Oral Daily   multivitamin with minerals  1 tablet Oral Daily   pantoprazole  40 mg Oral BID   thiamine  100 mg Oral Daily   zinc sulfate  220 mg Oral Daily   [name], MD 12/11/2021, 10:39 AM

## 2021-12-11 NOTE — Consult Note (Signed)
Chief Complaint: Patient was seen in consultation today for random renal biopsy Chief Complaint  Patient presents with   Hypertension   Leg Swelling   at the request of [name]   Referring Physician(s): [name]   Supervising Physician: Arne Cleveland  Patient Status: [facility] - In-pt  History of Present Illness: Stacey Chambers is a 71 y.o. female with newly diagnosed cirrhosis with ascites of uncertain etiology along with acute kidney injury, portal hypertension, mild splenomegaly, diverticulosis, L3 compression fracture ,recent COVID-19, iron deficiency anemia, thrombocytopenia, hypertension, and diabetes.  Patient is known to IR service for paracentesis and random liver biopsy, performed by Dr. Earleen Newport on 12/09/21. Nephrology has been following the patient for eval and management of AKI on CKD, a random renal biopsy was recommended to the patient for further evaluation and management.   IR was requested for random renal biopsy.  Patient is [name] speaking, interpreter was utilized.  Patient sitting on the side of the bed, not in acute distress.  Denise headache, fever, chills, shortness of breath, cough, chest pain, abdominal pain, nausea ,vomiting, and bleeding.  During interview, patient became tearful.  She states that  she feels overwhelmed with all the information she received, and she does not have family in [place] who can support her.  Provided emotional support, assured that she will be well taken cared by everybody from [facility] team.  Patient verbalized understanding.   Patient raised concern about her eyes.  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  Informed the patient that the renal biopsy will not affect her vision.  Patient verbalized understanding.   Past Medical History:  Diagnosis Date   Anemia    Diabetes mellitus without complication ([place])    Hypertension    Vision loss     Past Surgical  History:  Procedure Laterality Date   BACK SURGERY     COLONOSCOPY     UPPER GASTROINTESTINAL ENDOSCOPY      Allergies: Patient has no known allergies.  Medications: Prior to Admission medications   Medication Sig Start Date End Date Taking? Authorizing Provider  amLODipine (NORVASC) 2.5 MG tablet Take 2.5 mg by mouth daily. 11/25/21  Yes [provider]  atorvastatin (LIPITOR) 20 MG tablet Take 20 mg by mouth daily. 11/25/21  Yes [provider]  esomeprazole (NEXIUM) 40 MG capsule Take 1 capsule (40 mg total) by mouth 2 (two) times daily. 09/13/21  Yes Mansouraty, Telford Nab., MD  ferrous gluconate (FERGON) 324 MG tablet Take 1 tablet (324 mg total) by mouth daily with breakfast. 09/13/21  Yes Mansouraty, Telford Nab., MD  fluticasone [facility]) 50 MCG/ACT nasal spray Place 1 spray into both nostrils daily as needed for allergies. 10/20/21  Yes [provider]  glipiZIDE (GLUCOTROL) 5 MG tablet Take 2.5 mg by mouth daily. 03/10/21  Yes [provider]  levothyroxine (SYNTHROID) 25 MCG tablet Take 25 mcg by mouth daily. 11/17/21  Yes [provider]  metoprolol succinate (TOPROL-XL) 50 MG 24 hr tablet Take 50 mg by mouth daily. 10/20/21  Yes [provider]  Vitamin D, Ergocalciferol, (DRISDOL) 1.25 MG (50000 UNIT) CAPS capsule Take 50,000 Units by mouth once a week. Wednesday 10/25/21  Yes [provider]  ACCU-CHEK GUIDE test strip  02/01/21   [provider]  Accu-Chek Softclix Lancets lancets SMARTSIG:Topical 01/31/21   [provider]     Family History  Problem Relation Age of Onset  Diabetes Brother    Colon cancer Neg Hx    Esophageal cancer Neg Hx    Stomach cancer Neg Hx    Pancreatic cancer Neg Hx    Inflammatory bowel disease Neg Hx    Liver disease Neg Hx    Rectal cancer Neg Hx    Colon polyps Neg Hx     Social History   Socioeconomic History   Marital status: Married    Spouse name: Not on  file   Number of children: Not on file   Years of education: Not on file   Highest education level: Not on file  Occupational History   Not on file  Tobacco Use   Smoking status: Never   Smokeless tobacco: Never  Vaping Use   Vaping Use: Never used  Substance and Sexual Activity   Alcohol use: Never    Comment: once in awhile   Drug use: Not Currently    Types: Marijuana    Comment: Use of THC tea 1-2 times per week   Sexual activity: Not on file  Other Topics Concern   Not on file  Social History Narrative   Not on file   Social Determinants of Health   Financial Resource Strain: Not on file  Food Insecurity: Not on file  Transportation Needs: Not on file  Physical Activity: Not on file  Stress: Not on file  Social Connections: Not on file     Review of Systems: A 12 point ROS discussed and pertinent positives are indicated in the HPI above.  All other systems are negative.  Vital Signs: BP 121/63 (BP Location: Left Arm)   Pulse 72   Temp 98.8 F (37.1 C) (Oral)   Resp 18   Ht 4\' 10"  (1.473 m)   Wt 137 lb 14.4 oz (62.6 kg)   SpO2 97%   BMI 28.82 kg/m    Physical Exam Vitals reviewed.  Constitutional:      General: She is not in acute distress.    Appearance: She is not ill-appearing.  HENT:     Head: Normocephalic.     Mouth/Throat:     Mouth: Mucous membranes are dry.  Eyes:     Comments: Scleral icterus on right eye   Cardiovascular:     Rate and Rhythm: Normal rate and regular rhythm.     Heart sounds: Normal heart sounds.  Pulmonary:     Effort: Pulmonary effort is normal.     Breath sounds: Normal breath sounds.  Abdominal:     Palpations: Abdomen is soft.  Musculoskeletal:     Cervical back: Neck supple.  Skin:    General: Skin is warm and dry.     Coloration: Skin is not pale.     Comments: + dressing on epigastric, covering puncture site of liver biopsy. Site is clean, dry, intact, no s/s infection or bleeding.   Neurological:      Mental Status: She is alert and oriented to person, place, and time.  Psychiatric:        Behavior: Behavior normal.        Judgment: Judgment normal.     Comments: Overwhelmed, sad.      MD Evaluation Airway: WNL Heart: WNL Abdomen: WNL Chest/ Lungs: WNL ASA  Classification: 3 Mallampati/Airway Score: Two  Imaging: VAS [place] LOWER EXTREMITY VENOUS (DVT)  Result Date: 12/11/2021  Lower Venous DVT Study Patient Name:  Stacey Chambers Kendall  Date of Exam:   12/10/2021 Medical Rec #: [id number]  Accession #:    [id number] Date of Birth: 08/11/1950             Patient Gender: F Patient Age:   52 years Exam Location:  [facility] Procedure:      VAS [place] LOWER EXTREMITY VENOUS (DVT) Referring Phys: Vicente Serene WOODS --------------------------------------------------------------------------------  Indications: Elevated D-dimer in COVID+ patient.  Comparison Study: No previous exams Performing Technologist: Jody Hill RVT, RDMS  Examination Guidelines: A complete evaluation includes B-mode imaging, spectral Doppler, color Doppler, and power Doppler as needed of all accessible portions of each vessel. Bilateral testing is considered an integral part of a complete examination. Limited examinations for reoccurring indications may be performed as noted. The reflux portion of the exam is performed with the patient in reverse Trendelenburg.  +---------+---------------+---------+-----------+----------+--------------+ RIGHT    CompressibilityPhasicitySpontaneityPropertiesThrombus Aging +---------+---------------+---------+-----------+----------+--------------+ CFV      Full           Yes      Yes                                 +---------+---------------+---------+-----------+----------+--------------+ SFJ      Full                                                        +---------+---------------+---------+-----------+----------+--------------+ FV Prox  Full           Yes       Yes                                 +---------+---------------+---------+-----------+----------+--------------+ FV Mid   Full           Yes      Yes                                 +---------+---------------+---------+-----------+----------+--------------+ FV DistalFull           Yes      Yes                                 +---------+---------------+---------+-----------+----------+--------------+ PFV      Full                                                        +---------+---------------+---------+-----------+----------+--------------+ POP      Full           Yes      Yes                                 +---------+---------------+---------+-----------+----------+--------------+ PTV      Full                                                        +---------+---------------+---------+-----------+----------+--------------+  PERO     Full                                                        +---------+---------------+---------+-----------+----------+--------------+   +---------+---------------+---------+-----------+----------+--------------+ LEFT     CompressibilityPhasicitySpontaneityPropertiesThrombus Aging +---------+---------------+---------+-----------+----------+--------------+ CFV      Full           Yes      Yes                                 +---------+---------------+---------+-----------+----------+--------------+ SFJ      Full                                                        +---------+---------------+---------+-----------+----------+--------------+ FV Prox  Full           Yes      Yes                                 +---------+---------------+---------+-----------+----------+--------------+ FV Mid   Full           Yes      Yes                                 +---------+---------------+---------+-----------+----------+--------------+ FV DistalFull           Yes      Yes                                  +---------+---------------+---------+-----------+----------+--------------+ PFV      Full                                                        +---------+---------------+---------+-----------+----------+--------------+ POP      Full           Yes      Yes                                 +---------+---------------+---------+-----------+----------+--------------+ PTV      Full                                                        +---------+---------------+---------+-----------+----------+--------------+ PERO     Full                                                        +---------+---------------+---------+-----------+----------+--------------+  Summary: BILATERAL: - No evidence of deep vein thrombosis seen in the lower extremities, bilaterally. -No evidence of popliteal cyst, bilaterally. Pulsatile doppler waveforms, bilaterally.   *See table(s) above for measurements and observations. Electronically signed by Servando Snare MD on 12/11/2021 at 10:07:41 AM.    Final    US BIOPSY (LIVER)  Result Date: 12/09/2021 INDICATION: 71 year old female referred for medical liver biopsy, cirrhosis EXAM: ULTRASOUND-GUIDED MEDICAL LIVER BIOPSY MEDICATIONS: None. ANESTHESIA/SEDATION: Moderate (conscious) sedation was employed during this procedure. A total of Versed 2.0 mg and Fentanyl 100 mcg was administered intravenously by the radiology nurse. Total intra-service moderate Sedation Time: 14 minutes. The patient's level of consciousness and vital signs were monitored continuously by radiology nursing throughout the procedure under my direct supervision. COMPLICATIONS: None PROCEDURE: Informed written consent was obtained from the patient via [name] interpretation after a thorough discussion of the procedural risks, benefits and alternatives. All questions were addressed. Maximal Sterile Barrier Technique was utilized including caps, mask, sterile gowns, sterile gloves, sterile drape, hand  hygiene and skin antiseptic. A timeout was performed prior to the initiation of the procedure. Ultrasound survey of the right liver lobe performed with images stored and sent to PACs. The subxiphoid region was prepped with chlorhexidine in a sterile fashion, and a sterile drape was applied covering the operative field. A sterile gown and sterile gloves were used for the procedure. Local anesthesia was provided with 1% Lidocaine. The patient was prepped and draped sterilely and the skin and subcutaneous tissues were generously infiltrated with 1% lidocaine. A 17 gauge introducer needle was then advanced under ultrasound guidance into the left liver via subxiphoid location, avoiding the ascites. The stylet was removed, and multiple separate 18 gauge core biopsy were retrieved. Samples were placed into formalin for transportation to the lab. Gel-Foam pledgets were then infused with a small amount of saline for assistance with hemostasis. The needle was removed, and a final ultrasound image was performed. The patient tolerated the procedure well and remained hemodynamically stable throughout. No complications were encountered and no significant blood loss was encounter. IMPRESSION: Status post medical liver biopsy, left liver lobe. Signed, Dulcy Fanny. Dellia Nims, RPVI [facility] Radiology Electronically Signed   By: Corrie Mckusick D.O.   On: 12/09/2021 17:22   DG CHEST PORT 1 VIEW  Result Date: 12/07/2021 CLINICAL DATA:  Shortness of breath.  Coronavirus infection. EXAM: PORTABLE CHEST 1 VIEW COMPARISON:  12/05/2021.  12/04/2021.  12/01/2021. FINDINGS: Apparent enlargement of bilateral pleural effusions, more on the right than the left. Abnormal density the lung that could be due to a combination of edema, atelectasis and pneumonia, worse on the right than the left. IMPRESSION: Most consistent with fluid overload/congestive heart failure. Enlarging effusions right more  than left. Mild interstitial edema. Atelectasis related to the effusions. Coexistent pneumonia not excluded. Electronically Signed   By: Nelson Chimes M.D.   On: 12/07/2021 07:40   DG CHEST PORT 1 VIEW  Result Date: 12/05/2021 CLINICAL DATA:  Hypertension and leg edema. EXAM: PORTABLE CHEST 1 VIEW COMPARISON:  December 04, 2021 FINDINGS: The heart size and mediastinal contours are within normal limits. Mild increased pulmonary interstitium is identified bilaterally. Minimal right pleural effusion is noted. Mild patchy consolidation of right lung base is noted. The visualized skeletal structures are stable. IMPRESSION: 1. Mild interstitial edema. 2. Mild patchy consolidation of right lung base, superimposed pneumonia is not excluded. Electronically Signed   By: Abelardo Diesel M.D.   On: 12/05/2021 07:26  DG CHEST PORT 1 VIEW  Result Date: 12/04/2021 CLINICAL DATA:  Bilateral lower extremity edema.  Hypertension. EXAM: PORTABLE CHEST 1 VIEW COMPARISON:  12/01/2021 FINDINGS: Heart size remains within normal limits. New symmetric airspace disease is seen with bibasilar predominance, most likely due to pulmonary edema. Small layering bilateral pleural effusions are also suspected. Lumbar spine fusion hardware also noted. IMPRESSION: New symmetric airspace disease, most likely due to pulmonary edema. Suspect small layering bilateral pleural effusions. Electronically Signed   By: Marlaine Hind M.D.   On: 12/04/2021 08:23   US RENAL  Result Date: 12/02/2021 CLINICAL DATA:  Acute renal insufficiency. EXAM: RENAL / URINARY TRACT ULTRASOUND COMPLETE COMPARISON:  Right upper quadrant ultrasound of 12/01/2021. CT 12/01/2021. FINDINGS: Right Kidney: Renal measurements: 7.2 x 4.6 x 3.4 cm = volume: 67 mL. Mildly increased renal echogenicity. Mild renal cortical thinning. No hydronephrosis. Left Kidney: Renal measurements: 7.0 x 4.2 x 5.6 cm = volume: 86 mL. Mildly increased renal echogenicity. Normal cortical thickness. No  hydronephrosis. 2.5 cm lesion within the lower pole was characterized on the MRI of 06/29/2021 as a complex cyst. Bladder: Appears normal for degree of bladder distention. Other: Abdominal ascites and cirrhosis. IMPRESSION: 1. No hydronephrosis. 2. Increased renal echogenicity, suggesting medical renal disease. 3. Cirrhosis and ascites. Electronically Signed   By: Abigail Miyamoto M.D.   On: 12/02/2021 17:49   ECHOCARDIOGRAM COMPLETE  Result Date: 12/02/2021    ECHOCARDIOGRAM REPORT   Patient Name:   Stacey Chambers Blanck Date of Exam: 12/02/2021 Medical Rec #:  [id number]             Height:       58.0 in Accession #:    [id number]            Weight:       143.3 lb Date of Birth:  Nov 07, 1950            BSA:          1.581 m Patient Age:    89 years              BP:           148/79 mmHg Patient Gender: F                     HR:           74 bpm. Exam Location:  Inpatient Procedure: 2D Echo, Cardiac Doppler and Color Doppler Indications:    Dyspnea  History:        Patient has no prior history of Echocardiogram examinations.                 Risk Factors:Diabetes and Hypertension.  Sonographer:    Joette Catching RCS Referring Phys: [id number] [facility] LATIF [facility]  Sonographer Comments: Technically challenging study due to limited acoustic windows. IMPRESSIONS  1. Left ventricular ejection fraction, by estimation, is 60 to 65%. The left ventricle has normal function. The left ventricle has no regional wall motion abnormalities. Left ventricular diastolic parameters are consistent with Grade II diastolic dysfunction (pseudonormalization).  2. Right ventricular systolic function is normal. The right ventricular size is normal. There is mildly elevated pulmonary artery systolic pressure.  3. Left atrial size was mildly dilated.  4. The mitral valve is normal in structure. No evidence of mitral valve regurgitation.  5. There is mild calcification of the aortic valve. Aortic valve regurgitation is trivial.  6. The inferior vena  cava is normal in size with  greater than 50% respiratory variability, suggesting right atrial pressure of 3 mmHg. Comparison(s): No prior Echocardiogram. FINDINGS  Left Ventricle: Left ventricular ejection fraction, by estimation, is 60 to 65%. The left ventricle has normal function. The left ventricle has no regional wall motion abnormalities. The left ventricular internal cavity size was normal in size. There is  no left ventricular hypertrophy. Left ventricular diastolic parameters are consistent with Grade II diastolic dysfunction (pseudonormalization). Right Ventricle: The right ventricular size is normal. Right ventricular systolic function is normal. There is mildly elevated pulmonary artery systolic pressure. The tricuspid regurgitant velocity is 3.01 m/s, and with an assumed right atrial pressure of 3 mmHg, the estimated right ventricular systolic pressure is 78.2 mmHg. Left Atrium: Left atrial size was mildly dilated. Right Atrium: Right atrial size was normal in size. Pericardium: There is no evidence of pericardial effusion. Mitral Valve: The mitral valve is normal in structure. No evidence of mitral valve regurgitation. Tricuspid Valve: The tricuspid valve is not well visualized. Tricuspid valve regurgitation is mild. Aortic Valve: There is mild calcification of the aortic valve. Aortic valve regurgitation is trivial. Aortic valve mean gradient measures 9.0 mmHg. Aortic valve peak gradient measures 17.8 mmHg. Aortic valve area, by VTI measures 2.52 cm. Pulmonic Valve: The pulmonic valve was not well visualized. Pulmonic valve regurgitation is not visualized. Aorta: The aortic root and ascending aorta are structurally normal, with no evidence of dilitation. Venous: The inferior vena cava is normal in size with greater than 50% respiratory variability, suggesting right atrial pressure of 3 mmHg. IAS/Shunts: No atrial level shunt detected by color flow Doppler.  LEFT VENTRICLE PLAX 2D LVIDd:         4.70  cm   Diastology LVIDs:         3.10 cm   LV e' medial:    8.38 cm/s LV PW:         1.00 cm   LV E/e' medial:  13.6 LV IVS:        0.80 cm   LV e' lateral:   6.85 cm/s LVOT diam:     1.90 cm   LV E/e' lateral: 16.6 LV SV:         105 LV SV Index:   67 LVOT Area:     2.84 cm  RIGHT VENTRICLE             IVC RV Basal diam:  3.10 cm     IVC diam: 1.90 cm RV Mid diam:    1.90 cm RV S prime:     14.50 cm/s TAPSE (M-mode): 2.5 cm LEFT ATRIUM             Index        RIGHT ATRIUM           Index LA diam:        3.50 cm 2.21 cm/m   RA Area:     11.10 cm LA Vol (A2C):   66.0 ml 41.76 ml/m  RA Volume:   19.90 ml  12.59 ml/m LA Vol (A4C):   53.2 ml 33.66 ml/m LA Biplane Vol: 59.4 ml 37.58 ml/m  AORTIC VALVE                     PULMONIC VALVE AV Area (Vmax):    2.08 cm      PV Vmax:       1.21 m/s AV Area (Vmean):   2.09 cm      PV Peak  grad:  5.9 mmHg AV Area (VTI):     2.52 cm AV Vmax:           211.00 cm/s AV Vmean:          145.000 cm/s AV VTI:            0.418 m AV Peak Grad:      17.8 mmHg AV Mean Grad:      9.0 mmHg LVOT Vmax:         155.00 cm/s LVOT Vmean:        107.000 cm/s LVOT VTI:          0.371 m LVOT/AV VTI ratio: 0.89  AORTA Ao Root diam: 2.90 cm MITRAL VALVE                TRICUSPID VALVE MV Area (PHT): 4.49 cm     TR Peak grad:   36.2 mmHg MV Decel Time: 169 msec     TR Vmax:        301.00 cm/s MR Peak grad: 81.0 mmHg MR Vmax:      450.00 cm/s   SHUNTS MV E velocity: 114.00 cm/s  Systemic VTI:  0.37 m MV A velocity: 94.30 cm/s   Systemic Diam: 1.90 cm MV E/A ratio:  1.21 Landscape architect signed by Phineas Inches Signature Date/Time: 12/02/2021/11:32:23 AM    Final    US Paracentesis  Result Date: 12/01/2021 INDICATION: Patient with history of cirrhosis by imaging, portal hypertension, mild splenomegaly, ascites, pleural effusions, COVID-19 positive, worsening renal function; request received for diagnostic paracentesis. EXAM: ULTRASOUND GUIDED DIAGNOSTIC PARACENTESIS MEDICATIONS: 8 mL 1%  lidocaine COMPLICATIONS: None immediate. PROCEDURE: Informed written consent was obtained from the patient via interpreter after a discussion of the risks, benefits and alternatives to treatment. A timeout was performed prior to the initiation of the procedure. Initial ultrasound scanning demonstrates a moderate amount of ascites within the right lower abdominal quadrant. The right lower abdomen was prepped and draped in the usual sterile fashion. 1% lidocaine was used for local anesthesia. Following this, a 19 gauge, 10-cm, Yueh catheter was introduced. An ultrasound image was saved for documentation purposes. The paracentesis was performed. The catheter was removed and a dressing was applied. The patient tolerated the procedure well without immediate post procedural complication. FINDINGS: A total of approximately 180 cc of hazy, yellow fluid was removed. Samples were sent to the laboratory as requested by the clinical team. IMPRESSION: Successful ultrasound-guided diagnostic paracentesis yielding 180 cc of peritoneal fluid. Read by: Rowe Robert, PA-C Electronically Signed   By: Sandi Mariscal M.D.   On: 12/01/2021 16:41   US Abdomen Limited RUQ (LIVER/GB)  Result Date: 12/01/2021 CLINICAL DATA:  Elevated LFTs. EXAM: ULTRASOUND ABDOMEN LIMITED RIGHT UPPER QUADRANT COMPARISON:  MRI of the abdomen without with contrast 06/29/2021. CT abdomen pelvis without contrast 12/01/2021 FINDINGS: Gallbladder: Gallbladder is contracted. Wall measures 5.3 mm. Pericholecystic fluid is present. Sludge is noted in the gallbladder. Common bile duct: Diameter: 2.2 mm, within normal limits. Liver: The liver is somewhat hyperechoic and heterogeneous. Nodular edge noted. Portal vein is patent on color Doppler imaging with normal direction of blood flow towards the liver. Other: Moderate abdominal ascites is present. Moderate right-sided pleural effusion is also noted. IMPRESSION: 1. Cirrhotic appearance of the liver. 2. Dominant  ascites. 3. Gallbladder wall thickening may be artifactual given the gallbladder is shrunken and abdominal ascites are present. Electronically Signed   By: San Morelle M.D.   On: 12/01/2021 14:42  CT Abdomen Pelvis Wo Contrast  Result Date: 12/01/2021 CLINICAL DATA:  Abdominal pain, acute, nonlocalized EXAM: CT ABDOMEN AND PELVIS WITHOUT CONTRAST TECHNIQUE: Multidetector CT imaging of the abdomen and pelvis was performed following the standard protocol without IV contrast. RADIATION DOSE REDUCTION: This exam was performed according to the departmental dose-optimization program which includes automated exposure control, adjustment of the mA and/or kV according to patient size and/or use of iterative reconstruction technique. COMPARISON:  MRI 06/29/2021 FINDINGS: Lower chest: Small layering bilateral pleural effusions with mild dependent bibasilar airspace opacities. Heart size within normal limits. Hepatobiliary: Nodular hepatic surface contour. No focal liver lesion identified on unenhanced imaging. Probable small stone in the region of the gallbladder neck. Gallbladder wall appears thickened. Pancreas: Unremarkable. No pancreatic ductal dilatation or surrounding inflammatory changes. Spleen: Mild splenomegaly.  No focal splenic lesion identified. Adrenals/Urinary Tract: Unremarkable adrenal glands. 2.9 cm slightly hyperdense lesion arising anteriorly from the lower pole of the left kidney previously seen to be a benign cyst on prior MRI. Kidneys otherwise within normal limits. No renal lesion or hydronephrosis. Urinary bladder is unremarkable. Stomach/Bowel: Stomach is within normal limits. Appendix appears normal. Left-sided colonic diverticulosis. No evidence of bowel wall thickening, distention, or inflammatory changes. Vascular/Lymphatic: Scattered aortoiliac atherosclerotic calcifications without aneurysm. No abdominopelvic lymphadenopathy. Reproductive: Uterus and bilateral adnexa are  unremarkable. Other: Moderate volume ascites.  No pneumoperitoneum. Musculoskeletal: Anasarca. Prior T12-L5 posterior spinal fusion. Chronic appearing severe compression fracture of the L3 vertebral body. IMPRESSION: 1. Cirrhotic liver with evidence of portal hypertension including mild splenomegaly and moderate volume ascites. 2. Small layering bilateral pleural effusions with mild dependent bibasilar airspace opacities, which may represent atelectasis and/or pneumonia. 3. Probable small stone in the region of the gallbladder neck with associated gallbladder wall thickening. If there is clinical concern for acute cholecystitis, further evaluation with right upper quadrant ultrasound is recommended. 4. Colonic diverticulosis without evidence of acute diverticulitis. 5. Chronic appearing severe compression fracture of the L3 vertebral body. 6. Aortic Atherosclerosis (ICD10-I70.0). Electronically Signed   By: Davina Poke D.O.   On: 12/01/2021 13:40   DG Chest Port 1 View  Result Date: 12/01/2021 CLINICAL DATA:  Shortness of breath, edema both lower extremities EXAM: PORTABLE CHEST 1 VIEW COMPARISON:  None Available. FINDINGS: Transverse diameter of heart is increased. Central pulmonary vessels are prominent. There are no signs of alveolar pulmonary edema or focal pulmonary consolidation. Small linear density in the left lower lung fields may suggest scarring or subsegmental atelectasis. There is no pleural effusion or pneumothorax. IMPRESSION: Central pulmonary vessels are prominent, possibly suggesting mild CHF. There is no focal pulmonary consolidation. Small transverse linear density in the left lower lung fields may suggest scarring or subsegmental atelectasis. Electronically Signed   By: Elmer Picker M.D.   On: 12/01/2021 12:33    Labs:  CBC: Recent Labs    12/08/21 0538 12/09/21 0523 12/10/21 0623 12/11/21 0611  WBC 3.7* 3.9* 4.5 5.1  HGB 8.5* 8.7* 8.6* 8.3*  HCT 26.3* 26.6* 26.8*  26.1*  PLT 104* 108* 103* 97*    COAGS: Recent Labs    12/01/21 1435 12/08/21 1026 12/10/21 1621  INR 1.4* 1.4* 1.3*    BMP: Recent Labs    12/08/21 0538 12/09/21 0523 12/10/21 0623 12/11/21 0611  NA 138 140 140 139  K 3.7 3.9 3.9 4.2  CL 104 107 110 109  CO2 24 24 23  21*  GLUCOSE 93 90 118* 132*  BUN 58* 56* 56* 61*  CALCIUM 8.3* 8.1* 8.3*  8.3*  CREATININE 3.80* 4.01* 4.06* 4.71*  GFRNONAA 12* 12* 11* 10*    LIVER FUNCTION TESTS: Recent Labs    12/08/21 0538 12/09/21 0523 12/10/21 0623 12/11/21 0611  BILITOT 0.8 0.9 0.9 0.7  AST 30 30 27 26   ALT 18 17 17 17   ALKPHOS 95 96 94 96  PROT 6.3* 6.2* 6.3* 6.5  ALBUMIN 2.7* 2.7* 2.7* 2.9*    TUMOR MARKERS: No results for input(s): "AFPTM", "CEA", "CA199", "CHROMGRNA" in the last 8760 hours.  Assessment and Plan: 71 y.o. female with AKI in CKD with unclear etiology.  IR was requested for image guided random renal biopsy.  Case was reviewed with Dr. Vernard Gambles due to hx of bilateral renal atrophy, approved for US guided random renal bx.   VSS CBC stable RF continue to worsens  INR 1.3 on 6/10  On sq heparin q8h   Patient is [name] speaking, interpreter was utilized.  Risks and benefits of random renal biopsy was discussed with the patient and/or patient's family including, but not limited to bleeding, infection, damage to adjacent structures or low yield requiring additional tests.  Patient asked what happens if she start bleeding after the biopsy.  Informed the patient that most bleeding is minimal and resolves spontaneously, but if bleeding is substantial, IR will required to perform renal angiogram with possible embolization to stop the bleeding. The procedure was discussed briefly (requires right groin puncture, contrast which may further damage her kidneys, and bedrest  after procedure etc.)  All of the questions were answered and there is agreement to proceed.  Consent signed and in chart.   Biopsy  was requested to be done on Monday; however, [facility] IR has full outpatient schedule an well as two inpatient add-ons for Monday.  Informed ordering MD that biopsy will be done on Tuesday at the earliest.   The procedure is tentatively scheduled for Tuesday pending IR/US schedule.   PLAN - NPO Tuesday  [place] - Tuesday 6 am and 2 pm sq heparin held on [facility]   Thank you for this interesting consult.  I greatly enjoyed meeting Stacey Chambers and look forward to participating in their care.  A copy of this report was sent to the requesting provider on this date.  Electronically Signed: Tera Mater, PA-C 12/11/2021, 11:40 AM   I spent a total of 40 Minutes    in face to face in clinical consultation, greater than 50% of which was counseling/coordinating care for random renal bx.   This chart was dictated using voice recognition software.  Despite best efforts to proofread,  errors can occur which can change the documentation meaning.

## 2021-12-11 NOTE — Progress Notes (Signed)
Gastroenterology Inpatient Follow-up Note   PATIENT IDENTIFICATION  Stacey Chambers is a 71 y.o. female Hospital Day: 11  SUBJECTIVE  The patient's chart and labs were reviewed this morning. No significant change from yesterday into today based on previous documentation by Dr. Candis Schatz. She denies any fevers or chills. Her kidney function remains elevated and there are plans now to consider a kidney biopsy to better define her acute on chronic renal insufficiency. The patient denies any issues with jaundice, scleral icterus, generalized pruritus, clay-colored stools, hematemesis, coffee-ground emesis, confusion.   OBJECTIVE  Scheduled Inpatient Medications:   amLODipine  10 mg Oral Daily   vitamin C  500 mg Oral Daily   docusate sodium  100 mg Oral BID   folic acid  1 mg Oral Daily   heparin injection (subcutaneous)  5,000 Units Subcutaneous Q8H   hydrALAZINE  25 mg Oral Q8H   insulin aspart  0-15 Units Subcutaneous TID WC   insulin aspart  0-5 Units Subcutaneous QHS   levothyroxine  25 mcg Oral Daily   melatonin  3 mg Oral Once   metoprolol succinate  50 mg Oral Daily   multivitamin with minerals  1 tablet Oral Daily   pantoprazole  40 mg Oral BID   thiamine  100 mg Oral Daily   zinc sulfate  220 mg Oral Daily   Continuous Inpatient Infusions:  PRN Inpatient Medications: acetaminophen **OR** acetaminophen, chlorpheniramine-HYDROcodone, fluticasone, guaiFENesin-dextromethorphan, hydrALAZINE, Ipratropium-Albuterol, melatonin, ondansetron **OR** ondansetron (ZOFRAN) IV, oxyCODONE, polyethylene glycol, traZODone   Physical Examination  Temp:  [98.3 F (36.8 C)-98.8 F (37.1 C)] 98.8 F (37.1 C) (06/11 0550) Pulse Rate:  [66-72] 72 (06/11 0550) Resp:  [18] 18 (06/11 0550) BP: (121-138)/(63-71) 121/63 (06/11 0550) SpO2:  [97 %-100 %] 97 % (06/11 0550) Weight:  [62.6 kg] 62.6 kg (06/11 0500) Temp (24hrs), Avg:98.5 F (36.9 C), Min:98.3 F (36.8 C), Max:98.8 F  (37.1 C)  Weight: 62.6 kg GEN: NAD, appears stated age, nontoxic PSYCH: Cooperative EYE: Conjunctivae pink, sclerae anicteric ENT: MMM daily CV: Nontachycardic RESP: No audible wheezing GI: NABS, soft, protuberant abdomen, nontender MSK/EXT: Pedal edema bilaterally SKIN: No jaundice NEURO:  Alert & Oriented x 3, no focal deficits, no evidence of asterixis   Review of Data   Laboratory Studies   Recent Labs  Lab 12/09/21 0523 12/10/21 0623 12/11/21 0611  NA 140 140 139  K 3.9 3.9 4.2  CL 107 110 109  CO2 24 23 21*  BUN 56* 56* 61*  CREATININE 4.01* 4.06* 4.71*  GLUCOSE 90 118* 132*  CALCIUM 8.1* 8.3* 8.3*  MG 1.9 2.2 2.1  PHOS 4.3 4.6 4.1   Recent Labs  Lab 12/11/21 0611  AST 26  ALT 17  ALKPHOS 96    Recent Labs  Lab 12/09/21 0523 12/10/21 0623 12/11/21 0611  WBC 3.9* 4.5 5.1  HGB 8.7* 8.6* 8.3*  HCT 26.6* 26.8* 26.1*  PLT 108* 103* 97*   Recent Labs  Lab 12/08/21 1026 12/10/21 1621  INR 1.4* 1.3*   MELD 3.0: 22 at 12/11/2021  6:11 AM MELD-Na: 23 at 12/11/2021  6:11 AM Calculated from: Serum Creatinine: 4.71 mg/dL (Using max of 3 mg/dL) at 12/11/2021  6:11 AM Serum Sodium: 139 mmol/L (Using max of 137 mmol/L) at 12/11/2021  6:11 AM Total Bilirubin: 0.7 mg/dL (Using min of 1 mg/dL) at 12/11/2021  6:11 AM Serum Albumin: 2.9 g/dL at 12/11/2021  6:11 AM INR(ratio): 1.3 at 12/10/2021  4:21 PM Age at listing (hypothetical): 70  years Sex: Female at 12/11/2021  6:11 AM   Imaging Studies  VAS [place] LOWER EXTREMITY VENOUS (DVT)  Result Date: 12/11/2021  Lower Venous DVT Study Patient Name:  [facility]  Date of Exam:   12/10/2021 Medical Rec #: [id number]              Accession #:    [id number] Date of Birth: Mar 26, 1951             Patient Gender: F Patient Age:   42 years Exam [facility]  [facility] Procedure:      VAS [place] LOWER EXTREMITY VENOUS (DVT) Referring Phys: Vicente Serene WOODS  --------------------------------------------------------------------------------  Indications: Elevated D-dimer in COVID+ patient.  Comparison Study: No previous exams Performing Technologist: Jody Hill RVT, RDMS  Examination Guidelines: A complete evaluation includes B-mode imaging, spectral Doppler, color Doppler, and power Doppler as needed of all accessible portions of each vessel. Bilateral testing is considered an integral part of a complete examination. Limited examinations for reoccurring indications may be performed as noted. The reflux portion of the exam is performed with the patient in reverse Trendelenburg.  +---------+---------------+---------+-----------+----------+--------------+ RIGHT    CompressibilityPhasicitySpontaneityPropertiesThrombus Aging +---------+---------------+---------+-----------+----------+--------------+ CFV      Full           Yes      Yes                                 +---------+---------------+---------+-----------+----------+--------------+ SFJ      Full                                                        +---------+---------------+---------+-----------+----------+--------------+ FV Prox  Full           Yes      Yes                                 +---------+---------------+---------+-----------+----------+--------------+ FV Mid   Full           Yes      Yes                                 +---------+---------------+---------+-----------+----------+--------------+ FV DistalFull           Yes      Yes                                 +---------+---------------+---------+-----------+----------+--------------+ PFV      Full                                                        +---------+---------------+---------+-----------+----------+--------------+ POP      Full           Yes      Yes                                 +---------+---------------+---------+-----------+----------+--------------+  PTV      Full                                                         +---------+---------------+---------+-----------+----------+--------------+ PERO     Full                                                        +---------+---------------+---------+-----------+----------+--------------+   +---------+---------------+---------+-----------+----------+--------------+ LEFT     CompressibilityPhasicitySpontaneityPropertiesThrombus Aging +---------+---------------+---------+-----------+----------+--------------+ CFV      Full           Yes      Yes                                 +---------+---------------+---------+-----------+----------+--------------+ SFJ      Full                                                        +---------+---------------+---------+-----------+----------+--------------+ FV Prox  Full           Yes      Yes                                 +---------+---------------+---------+-----------+----------+--------------+ FV Mid   Full           Yes      Yes                                 +---------+---------------+---------+-----------+----------+--------------+ FV DistalFull           Yes      Yes                                 +---------+---------------+---------+-----------+----------+--------------+ PFV      Full                                                        +---------+---------------+---------+-----------+----------+--------------+ POP      Full           Yes      Yes                                 +---------+---------------+---------+-----------+----------+--------------+ PTV      Full                                                        +---------+---------------+---------+-----------+----------+--------------+  PERO     Full                                                        +---------+---------------+---------+-----------+----------+--------------+     Summary: BILATERAL: - No evidence of deep vein thrombosis seen in the  lower extremities, bilaterally. -No evidence of popliteal cyst, bilaterally. Pulsatile doppler waveforms, bilaterally.   *See table(s) above for measurements and observations. Electronically signed by Servando Snare MD on 12/11/2021 at 10:07:41 AM.    Final    US BIOPSY (LIVER)  Result Date: 12/09/2021 INDICATION: 71 year old female referred for medical liver biopsy, cirrhosis EXAM: ULTRASOUND-GUIDED MEDICAL LIVER BIOPSY MEDICATIONS: None. ANESTHESIA/SEDATION: Moderate (conscious) sedation was employed during this procedure. A total of Versed 2.0 mg and Fentanyl 100 mcg was administered intravenously by the radiology nurse. Total intra-service moderate Sedation Time: 14 minutes. The patient's level of consciousness and vital signs were monitored continuously by radiology nursing throughout the procedure under my direct supervision. COMPLICATIONS: None PROCEDURE: Informed written consent was obtained from the patient via [name] interpretation after a thorough discussion of the procedural risks, benefits and alternatives. All questions were addressed. Maximal Sterile Barrier Technique was utilized including caps, mask, sterile gowns, sterile gloves, sterile drape, hand hygiene and skin antiseptic. A timeout was performed prior to the initiation of the procedure. Ultrasound survey of the right liver lobe performed with images stored and sent to PACs. The subxiphoid region was prepped with chlorhexidine in a sterile fashion, and a sterile drape was applied covering the operative field. A sterile gown and sterile gloves were used for the procedure. Local anesthesia was provided with 1% Lidocaine. The patient was prepped and draped sterilely and the skin and subcutaneous tissues were generously infiltrated with 1% lidocaine. A 17 gauge introducer needle was then advanced under ultrasound guidance into the left liver via subxiphoid location, avoiding the ascites. The stylet was removed, and multiple separate 18 gauge core  biopsy were retrieved. Samples were placed into formalin for transportation to the lab. Gel-Foam pledgets were then infused with a small amount of saline for assistance with hemostasis. The needle was removed, and a final ultrasound image was performed. The patient tolerated the procedure well and remained hemodynamically stable throughout. No complications were encountered and no significant blood loss was encounter. IMPRESSION: Status post medical liver biopsy, left liver lobe. Signed, Dulcy Fanny. Dellia Nims, RPVI [facility] Radiology Electronically Signed   By: Corrie Mckusick D.O.   On: 12/09/2021 17:22    GI Procedures and Studies  No new studies to review   ASSESSMENT  Ms. Judice is a 71 y.o. 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  The patient is hemodynamically stable this morning.  Her kidney function continues to gradually worsen.  Our nephrology team, whom we appreciate greatly in work-up of her acute on chronic renal sufficiency is suggesting the need of a renal biopsy to further clarify potential diagnosis.  From a liver standpoint, her liver biopsy completed last week is still pending.  Would normally try to further titrate diuretics  but in the setting of her pleural effusions and progressive renal insufficiency understand our inability to do that currently.  It does not look like this is an HRS physiology as she had a urine sodium a few days ago that was greater than 25.  The inpatient GI service will follow the patient peripherally for a few days while the liver biopsy returns.   Although her recent endoscopy 3 months ago showed no evidence of varices, I would likely plan a repeat endoscopy at some point in the coming months now that we have found decompensated cirrhosis to see if anything has changed in regards to her esophagus or stomach.  No other additional work-up for now.  Thank you medicine and nephrology team for taking care of my patient.   PLAN/RECOMMENDATIONS  The inpatient GI service will follow-up this week Further recommendations will be based on the results of the liver biopsy from last week in regards to whether there may be any role of steroid therapy if autoimmune hepatitis is present   Please page/call with questions or concerns.   Justice Britain, MD [facility] Office # [phone]    LOS: 10 days  Irving Copas  12/11/2021, 12:57 PM

## 2021-12-12 DIAGNOSIS — K7469 Other cirrhosis of liver: Secondary | ICD-10-CM

## 2021-12-12 DIAGNOSIS — E118 Type 2 diabetes mellitus with unspecified complications: Secondary | ICD-10-CM | POA: Diagnosis not present

## 2021-12-12 DIAGNOSIS — I10 Essential (primary) hypertension: Secondary | ICD-10-CM | POA: Diagnosis not present

## 2021-12-12 DIAGNOSIS — R748 Abnormal levels of other serum enzymes: Secondary | ICD-10-CM | POA: Diagnosis not present

## 2021-12-12 DIAGNOSIS — R770 Abnormality of albumin: Secondary | ICD-10-CM | POA: Diagnosis not present

## 2021-12-12 DIAGNOSIS — K7031 Alcoholic cirrhosis of liver with ascites: Secondary | ICD-10-CM | POA: Diagnosis not present

## 2021-12-12 DIAGNOSIS — N179 Acute kidney failure, unspecified: Secondary | ICD-10-CM | POA: Diagnosis not present

## 2021-12-12 LAB — CBC WITH DIFFERENTIAL/PLATELET
Abs Immature Granulocytes: 0.02 10*3/uL (ref 0.00–0.07)
Basophils Absolute: 0 10*3/uL (ref 0.0–0.1)
Basophils Relative: 1 %
Eosinophils Absolute: 0.1 10*3/uL (ref 0.0–0.5)
Eosinophils Relative: 3 %
HCT: 25.2 % — ABNORMAL LOW (ref 36.0–46.0)
Hemoglobin: 8 g/dL — ABNORMAL LOW (ref 12.0–15.0)
Immature Granulocytes: 0 %
Lymphocytes Relative: 20 %
Lymphs Abs: 0.9 10*3/uL (ref 0.7–4.0)
MCH: 30.7 pg (ref 26.0–34.0)
MCHC: 31.7 g/dL (ref 30.0–36.0)
MCV: 96.6 fL (ref 80.0–100.0)
Monocytes Absolute: 0.5 10*3/uL (ref 0.1–1.0)
Monocytes Relative: 11 %
Neutro Abs: 3.1 10*3/uL (ref 1.7–7.7)
Neutrophils Relative %: 65 %
Platelets: 94 10*3/uL — ABNORMAL LOW (ref 150–400)
RBC: 2.61 MIL/uL — ABNORMAL LOW (ref 3.87–5.11)
RDW: 13.8 % (ref 11.5–15.5)
WBC: 4.7 10*3/uL (ref 4.0–10.5)
nRBC: 0 % (ref 0.0–0.2)

## 2021-12-12 LAB — D-DIMER, QUANTITATIVE: D-Dimer, Quant: 6.68 ug/mL-FEU — ABNORMAL HIGH (ref 0.00–0.50)

## 2021-12-12 LAB — GLUCOSE, CAPILLARY
Glucose-Capillary: 107 mg/dL — ABNORMAL HIGH (ref 70–99)
Glucose-Capillary: 114 mg/dL — ABNORMAL HIGH (ref 70–99)
Glucose-Capillary: 134 mg/dL — ABNORMAL HIGH (ref 70–99)
Glucose-Capillary: 111 mg/dL — ABNORMAL HIGH (ref 70–99)

## 2021-12-12 LAB — COMPREHENSIVE METABOLIC PANEL
ALT: 18 U/L (ref 0–44)
AST: 26 U/L (ref 15–41)
Albumin: 2.8 g/dL — ABNORMAL LOW (ref 3.5–5.0)
Alkaline Phosphatase: 88 U/L (ref 38–126)
Anion gap: 8 (ref 5–15)
BUN: 62 mg/dL — ABNORMAL HIGH (ref 8–23)
CO2: 22 mmol/L (ref 22–32)
Calcium: 8.6 mg/dL — ABNORMAL LOW (ref 8.9–10.3)
Chloride: 108 mmol/L (ref 98–111)
Creatinine, Ser: 4.76 mg/dL — ABNORMAL HIGH (ref 0.44–1.00)
GFR, Estimated: 9 mL/min — ABNORMAL LOW (ref >60)
Glucose, Bld: 112 mg/dL — ABNORMAL HIGH (ref 70–99)
Potassium: 4.4 mmol/L (ref 3.5–5.1)
Sodium: 138 mmol/L (ref 135–145)
Total Bilirubin: 0.7 mg/dL (ref 0.3–1.2)
Total Protein: 6.3 g/dL — ABNORMAL LOW (ref 6.5–8.1)

## 2021-12-12 LAB — PHOSPHORUS: Phosphorus: 3.8 mg/dL (ref 2.5–4.6)

## 2021-12-12 LAB — C-REACTIVE PROTEIN: CRP: 0.7 mg/dL (ref <1.0)

## 2021-12-12 LAB — LACTATE DEHYDROGENASE: LDH: 115 U/L (ref 98–192)

## 2021-12-12 LAB — FERRITIN: Ferritin: 26 ng/mL (ref 11–307)

## 2021-12-12 LAB — AMMONIA: Ammonia: 79 umol/L — ABNORMAL HIGH (ref 9–35)

## 2021-12-12 LAB — MAGNESIUM: Magnesium: 2.1 mg/dL (ref 1.7–2.4)

## 2021-12-12 MED ORDER — HEPARIN SODIUM (PORCINE) 5000 UNIT/ML IJ SOLN
5000.0000 [IU] | Freq: Three times a day (TID) | INTRAMUSCULAR | Status: DC
  Administered 2021-12-13 – 2021-12-27 (×34): 5000 [IU] via SUBCUTANEOUS
  Filled 2021-12-12 (×37): qty 1

## 2021-12-12 NOTE — Progress Notes (Signed)
Chaplain engaged in an initial visit with Stacey Chambers.  Stacey Chambers was tearful as she expressed she has a lot going on.  She shared about her healthcare journey and the various things happening in her body.  She talked about her back issues, not seeing in one eye, and the her kidneys failing her.  Chaplain could assess that Stacey Chambers feels quite overwhelmed.  Stacey Chambers also voiced that she doesn't really have any family.  Though she is the mother of three children, she is estranged from her two daughters and her son lives in [place].  Though she speaks to her son regularly, she notes that there is a lot of geographical distance between them.  Stacey Chambers does have one friend here who checks on her daily.    Chaplain could assess that Stacey Chambers's healthcare crises is heightened by feeling alone and without a community.  Stacey Chambers even talked about not wanting to have a casket but wanting to be cremated because she wouldn't have family here to check in on her grave.    During visit, Stacey Chambers also shared about being Catholic and wanting the sacraments.  Chaplain let her know that she could work to get a [name] speaking Stacey Chambers.  Stacey Chambers is calling various [place] for support.  Chaplain believes this will also aide in providing [place] with some community.    Chaplain offered listening, a sense of community, and presence.  Chaplain will continue to follow-up and offer support.     12/12/21 1500  Clinical Encounter Type  Visited With Patient  Visit Type Initial;Spiritual support  Referral From Nurse;Patient  Consult/Referral To Chaplain  Stress Factors  Patient Stress Factors Health changes;Loss of control;Family relationships;Lack of caregivers;Major life changes

## 2021-12-12 NOTE — Progress Notes (Addendum)
Patient ID: Tyshay Adee, female   DOB: [birth year], 71 y.o.   MRN: [id number] [facility] Progress Note   Subjective:   Seen in room, had extensive discussion about her renal failure, possible dialysis and kidney biopsy.  No N/V or confusion.    Objective:   BP 121/63 (BP Location: Left Arm)   Pulse 72   Temp 98.7 F (37.1 C) (Oral)   Resp 16   Ht '4\' 10"'  (1.473 m)   Wt 64.9 kg   SpO2 99%   BMI 29.90 kg/m   Intake/Output Summary (Last 24 hours) at 12/12/2021 1159 Last data filed at 12/12/2021 0536 Gross per 24 hour  Intake --  Output 300 ml  Net -300 ml    Weight change: 2.349 kg  Physical Exam: DJM:[id number] resting in bed WLN:LGXQJ regular rhythm and normal rate, normal s1 and s2 Resp: Clear to auscultation bilaterally, no rales/rhonchi. JHE:RDEY, slightly distended, non-tender, bowel sounds normal Ext: No lower extremity edema  Home meds include - norvasc 2.5, esomeprazole, ferous gluconate, fluticasone, levothyroxine, meteprolol succinate xl 50 qd, ergocalciferol       Date                         Creat               eGFR    July 2022                  1.41                 41 ml/min     Sept 2022                 1.69                 31 ml/min    Oct 2022                  1.85                     Feb 2023                  2.17                 23 ml/min    12/01/21                     2.93    12/02/21                     3.21                 15      UA - >50 rbcs, 0-5 wbcs, no bacteria, 0-5 epis, clear, yellow, mod Hb, > 300 prot     CXR - 6/01 > IMPRESSION: Central pulmonary vessels are prominent, possibly suggesting mild CHF. There is no focal pulmonary consolidation. Small transverse linear density in the left lower lung fields may suggest scarring or subsegmental atelectasis   UP/C ratio = 6.8 gm    UNa 48,  UCr 86    Renal US - 7.2 and 7.0 kidneys w/o hydro, + echotexture bilat    Urine sediment (6/03, undersigned) - showed numerous  dysmorphic RBC's, one rbc cast and rare granular casts, mild pyuria.   Assessment/ Plan:   1. Acute kidney Injury on CKD Stage IV: Baseline creatinine presumed to be about 2.1 with  gradual progression over the past year suspected to be from hypertension given preceding history and presentation with hypertensive urgency.  It appears less likely that she has type II HRS based on presentation and lack of response to earlier therapies.  The presence of RBCs with likely RBC fracture cast seen by Dr. Jonnie Finner on his analysis of the urine sediment raised concern for acute GN, however serologies including antinuclear antibody and ANCA were negative.  Creatinine up to mid 4s now. 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  A renal biopsy is required at this point for both definitive diagnosis and prognosis.  We filled out the [facility] labs pathology requisition form and placed it in the shadow chart.  IR consulted, planning for biopsy. Creat stable mid 4s, no uremic signs. No indication for HD at this point. Will follow.  2. 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  Previously with low ceruloplasmin and an elevated anti-smooth muscle antibody with liver biopsy report pending-suspicion for autoimmune hepatitis.  3. Hypertensive urgency: blood pressures are better controlled w/ addition of hydralazine to amlodipine and metoprolol xl.  4. 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   [name], MD 12/12/2021, 12:00 PM  Recent Labs  Lab 12/11/21 0611 12/12/21 0502  HGB 8.3* 8.0*  ALBUMIN 2.9* 2.8*  CALCIUM 8.3* 8.6*  PHOS 4.1 3.8  CREATININE 4.71* 4.76*  K 4.2 4.4   Inpatient  medications:  amLODipine  10 mg Oral Daily   vitamin C  500 mg Oral Daily   docusate sodium  100 mg Oral BID   folic acid  1 mg Oral Daily   [START ON 12/13/2021] heparin injection (subcutaneous)  5,000 Units Subcutaneous Q8H   hydrALAZINE  25 mg Oral Q8H   insulin aspart  0-15 Units Subcutaneous TID WC   insulin aspart  0-5 Units Subcutaneous QHS   levothyroxine  25 mcg Oral Daily   melatonin  3 mg Oral Once   metoprolol succinate  50 mg Oral Daily   multivitamin with minerals  1 tablet Oral Daily   pantoprazole  40 mg Oral BID   thiamine  100 mg Oral Daily   zinc sulfate  220 mg Oral Daily    acetaminophen **OR** acetaminophen, chlorpheniramine-HYDROcodone, fluticasone, guaiFENesin-dextromethorphan, hydrALAZINE, Ipratropium-Albuterol, melatonin, ondansetron **OR** ondansetron (ZOFRAN) IV, oxyCODONE, polyethylene glycol, traZODone

## 2021-12-12 NOTE — Care Management Important Message (Signed)
Important Message  Patient Details IM Letter given to the Patient Name: Stacey Chambers MRN: [id number] Date of Birth: [birth year]   Medicare Important Message Given:  Yes     Kerin Salen 12/12/2021, 10:05 AM

## 2021-12-12 NOTE — Progress Notes (Signed)
Patient ID: Stacey Chambers, female   DOB: 1950/11/05, 71 y.o.   MRN: [id number]    Progress Note   Subjective   Day # 10  CC; new diagnosis cirrhosis, progressive renal failure, COVID-19 infection  Status post liver biopsy 12/09/2021-path pending Renal biopsy-scheduled for Tuesday  Labs today-BUN 62/creatinine 4.76 trending up WBC 4.7/hemoglobin 8/hematocrit 25.2 Ammonia 79 Positive anti-smooth muscle antibody/low ceruloplasmin 24-hour urine copper in progress  Patient sitting at the side of the bed, smiling, says she feels a little bit better feels less fluid in her abdomen.  Eating without difficulty    Objective   Vital signs in last 24 hours: Temp:  [98.5 F (36.9 C)-98.7 F (37.1 C)] 98.7 F (37.1 C) (06/12 0538) Pulse Rate:  [71-73] 72 (06/12 0538) Resp:  [14-16] 16 (06/12 0538) BP: (109-121)/(61-63) 121/63 (06/12 0538) SpO2:  [99 %-100 %] 99 % (06/12 0538) Weight:  [64.9 kg] 64.9 kg (06/12 0500) Last BM Date : 12/06/21 General: Hispanic female in NAD Heart:  Regular rate and rhythm; no murmurs Lungs: Respirations even and unlabored, lungs CTA bilaterally Abdomen:  Soft, nontender , nontense ascites, normal bowel sounds. Extremities:  Without edema. Neurologic:  Alert and oriented,  grossly normal neurologically ,no asterixis Psych:  Cooperative. Normal mood and affect.  Intake/Output from previous day: 06/11 0701 - 06/12 0700 In: -  Out: 300 [Urine:300] Intake/Output this shift: No intake/output data recorded.  Lab Results: Recent Labs    12/10/21 0623 12/11/21 0611 12/12/21 0502  WBC 4.5 5.1 4.7  HGB 8.6* 8.3* 8.0*  HCT 26.8* 26.1* 25.2*  PLT 103* 97* 94*   BMET Recent Labs    12/10/21 0623 12/11/21 0611 12/12/21 0502  NA 140 139 138  K 3.9 4.2 4.4  CL 110 109 108  CO2 23 21* 22  GLUCOSE 118* 132* 112*  BUN 56* 61* 62*  CREATININE 4.06* 4.71* 4.76*  CALCIUM 8.3* 8.3* 8.6*   LFT Recent Labs    12/12/21 0502  PROT 6.3*  ALBUMIN  2.8*  AST 26  ALT 18  ALKPHOS 88  BILITOT 0.7   PT/INR Recent Labs    12/10/21 1621  LABPROT 15.8*  INR 1.3*    Studies/Results: VAS [place] LOWER EXTREMITY VENOUS (DVT)  Result Date: 12/11/2021  Lower Venous DVT Study Patient Name:  Stacey Chambers  Date of Exam:   12/10/2021 Medical Rec #: [id number]              Accession #:    [id number] Date of Birth: August 31, 1950             Patient Gender: F Patient Age:   47 years Exam Location:  [facility] Procedure:      VAS [place] LOWER EXTREMITY VENOUS (DVT) Referring Phys: Stacey Chambers --------------------------------------------------------------------------------  Indications: Elevated D-dimer in COVID+ patient.  Comparison Study: No previous exams Performing Technologist: Jody Hill RVT, RDMS  Examination Guidelines: A complete evaluation includes B-mode imaging, spectral Doppler, color Doppler, and power Doppler as needed of all accessible portions of each vessel. Bilateral testing is considered an integral part of a complete examination. Limited examinations for reoccurring indications may be performed as noted. The reflux portion of the exam is performed with the patient in reverse Trendelenburg.  +---------+---------------+---------+-----------+----------+--------------+ RIGHT    CompressibilityPhasicitySpontaneityPropertiesThrombus Aging +---------+---------------+---------+-----------+----------+--------------+ CFV      Full           Yes      Yes                                 +---------+---------------+---------+-----------+----------+--------------+  SFJ      Full                                                        +---------+---------------+---------+-----------+----------+--------------+ FV Prox  Full           Yes      Yes                                 +---------+---------------+---------+-----------+----------+--------------+ FV Mid   Full           Yes      Yes                                  +---------+---------------+---------+-----------+----------+--------------+ FV DistalFull           Yes      Yes                                 +---------+---------------+---------+-----------+----------+--------------+ PFV      Full                                                        +---------+---------------+---------+-----------+----------+--------------+ POP      Full           Yes      Yes                                 +---------+---------------+---------+-----------+----------+--------------+ PTV      Full                                                        +---------+---------------+---------+-----------+----------+--------------+ PERO     Full                                                        +---------+---------------+---------+-----------+----------+--------------+   +---------+---------------+---------+-----------+----------+--------------+ LEFT     CompressibilityPhasicitySpontaneityPropertiesThrombus Aging +---------+---------------+---------+-----------+----------+--------------+ CFV      Full           Yes      Yes                                 +---------+---------------+---------+-----------+----------+--------------+ SFJ      Full                                                        +---------+---------------+---------+-----------+----------+--------------+  FV Prox  Full           Yes      Yes                                 +---------+---------------+---------+-----------+----------+--------------+ FV Mid   Full           Yes      Yes                                 +---------+---------------+---------+-----------+----------+--------------+ FV DistalFull           Yes      Yes                                 +---------+---------------+---------+-----------+----------+--------------+ PFV      Full                                                         +---------+---------------+---------+-----------+----------+--------------+ POP      Full           Yes      Yes                                 +---------+---------------+---------+-----------+----------+--------------+ PTV      Full                                                        +---------+---------------+---------+-----------+----------+--------------+ PERO     Full                                                        +---------+---------------+---------+-----------+----------+--------------+     Summary: BILATERAL: - No evidence of deep vein thrombosis seen in the lower extremities, bilaterally. -No evidence of popliteal cyst, bilaterally. Pulsatile doppler waveforms, bilaterally.   *See table(s) above for measurements and observations. Electronically signed by Servando Snare MD on 12/11/2021 at 10:07:41 AM.    Final        Assessment / Plan:    #69 71 year old non-English-speaking Hispanic female admitted with fatigue and exertional dyspnea, swelling in her abdomen and lower extremities over the prior 2 weeks. Incidentally found COVID-positive on admission  Work-up has revealed new diagnosis of cirrhosis, mild splenomegaly, ascites.  Current M ELD= 23    Paracentesis with no evidence of SBP Elevated SAAG greater than 1.1 Anti-smooth muscle antibody positive, ceruloplasmin low  She underwent liver biopsy on 12/09/2021-results still pending  Undergoing 24-hour urine collection currently for copper  #2 acute on chronic renal failure-continue gradual rise in creatinine-etiology not entirely clear, not felt to be consistent with ATN/COVID associated AKI, rule out hypertensive/ischemic nephropathy, less likely type II HRS  Renal biopsy has been requested and is scheduled for tomorrow May require imminent  dialysis  #3 iron deficiency anemia-hemoglobin stable Very recent EGD and colonoscopy-and is for repeat colonoscopy in a 6 to 60-month interval  #4  cholelithiasis  Plan; further recommendations, once liver biopsy has resulted and 24-hour urine review No current diuretics       Principal Problem:   Cirrhosis ([place]) Active Problems:   History of gastric ulcer   History of thrombocytopenia   History of iron deficiency anemia   Gastric ulcer   Dyspnea on exertion   Swelling of lower extremity   Acute kidney injury superimposed on CKD ([place])   Diabetes mellitus type 2 with complications (HCC)   Acute diastolic CHF (congestive heart failure) ([place])   2019 novel coronavirus disease (COVID-19)   Hypertensive urgency   Diabetes mellitus type 2, controlled, without complications ([place])   Acute renal failure superimposed on stage 3b chronic kidney disease (HCC)   Nephrotic syndrome   Normocytic anemia   Low serum albumin   Thrombocytopenia (HCC)     LOS: 11 days   Raley Novicki PA-C 12/12/2021, 8:53 AM

## 2021-12-12 NOTE — Progress Notes (Signed)
PROGRESS NOTE    Stacey Chambers  XHB:[id number] DOB: 1951-05-13 DOA: 12/01/2021 PCP: Trey Sailors, PA     Brief Narrative:  71 y.o. HF ([name] speaker) PMHx chronic iron deficiency anemia, diabetes mellitus type 2, hypertension, hyperlipidemia, hypothyroidism, history of CKD stage IIIb as well as other comorbidities who presents with worsening lower extremity swelling and elevated blood pressure.  Patient states that she started feeling fatigued and tired since Friday of last week is noticed that her legs started having worsening swelling.  She states that whenever she ambulates she gets very dyspneic on exertion and very fatigued.  States that she denies any abdominal pain and denies any cough, chest pain or lightheadedness or dizziness.  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  She had lower extremity swelling and given her dyspnea on exertion we will obtain an echocardiogram and evaluate for heart failure.  She is also noted to have AKI on CKD stage IIIb.  Incidentally she was found to be COVID-positive.  Gastroenterology was consulted and [facility] was asked to admit this patient for lower extremity swelling.  Interventional radiology was consulted for a paracentesis and this has been done and she underwent a diagnostic tap.   **Interim History  Her edema has improved however renal function is worsened.  She is not complaining of any chest pain or shortness of breath.  Abdomen is doing okay today.  She has been consulted and doing further work-up and nephrology is also been consulted given her worsening renal function   Nephrology has placed the patient on low-dose IV fluids at 65 MLS per hour but now have reduced it to 40 mL/hour now that creatinine is leveling off.  Chest x-ray done today and "New symmetric airspace disease, most  likely due to pulmonary edema. Suspect small layering bilateral pleural effusions" to keep a close eye on this.   Nephrology evaluated and given the emergence of vascular congestion on chest x-ray there is discontinued IV fluids at this time and agreed to give her a trial of albumin and Lasix now to help augment diuresis.  After the Lasix she felt a little dizzy though   Subjective: 6/12 A/O x4, sitting in bed comfortably.  Negative abdominal pain   Assessment & Plan: Covid vaccination;   Principal Problem:   Cirrhosis ([place]) Active Problems:   History of gastric ulcer   History of thrombocytopenia   History of iron deficiency anemia   Gastric ulcer   Dyspnea on exertion   Swelling of lower extremity   Acute kidney injury superimposed on CKD ([place])   Diabetes mellitus type 2 with complications (HCC)   Acute diastolic CHF (congestive heart failure) ([place])   2019 novel coronavirus disease (COVID-19)   Hypertensive urgency   Diabetes mellitus type 2, controlled, without complications ([place])   Acute renal failure superimposed on stage 3b chronic kidney disease (HCC)   Nephrotic syndrome   Normocytic anemia   Low serum albumin   Thrombocytopenia ([place])  New onset Decompensated liver cirrhosis with portal HTN/mild splenomegaly/moderate ascites  -Unclear etiology of her liver cirrhosis; consulted GI for further evaluation  -Acute hepatitis panel negative -Diagnostic paracentesis: Aspirated 140m sent for pathology: Not consistent with SBP -SAAG > 1.1 consistent with portal hypertension/cirrhosis. -6/8 discussed case with Dr. CCandis SchatzGI plan is for A.m. biopsy of liver - Hx EtOH abuse - 6/2 Echocardiogram EF  of 60-65% and G2DD.  See results below -Query EtOH associated cirrhosis.   -Urine protein > 300.  -No focal lesion on unenhanced CT scan.  -AFP WNL -.HBsAg, HCV ab, ANA, AMA and A-1 antitrypsin Negative.  -No evidence for hemochromatosis. Ceruloplasmin low at 16.4. Serum  copper level pending.  -ASMA elevated at 52.  -Negative ANA. IgG pending.   -Total bili 0.8.  Alk phos 95.  AST 30.  ALT 18.  No evidence of hepatic encephalopathy. -Ammonia =44.   -INR/PT. Antimicrosomal Ab -LK. -Calculate MELD score after PT/INR completed  -Eventual 24 hr urine copper to rule out Wilson's disease  -Possible transjugular liver biopsy pending  -2gm low sodium diet  -6/12 results of liver biopsy pending  Dyspnea on Exertion r/o CHF (Unclear type) -6/8 resolved  Acute Diastolic CHF -On admission BNP on admission 715 -Strict in and out +1.1 L - Daily weight    Hypertensive Urgency -BP was elevated on Admission -6/9 increase amlodipine 10 mg daily -6/9 Hydralazine IV PRN -6/9 Hydralazine 25 mg TID -6/9 Toprol 50 mg daily    COVID-19 Disease -Currently asymptomatic  COVID-19 Labs  Recent Labs    12/10/21 0623 12/11/21 0611 12/12/21 0502  DDIMER 6.87* 6.60* 6.68*  FERRITIN '26 28 26  ' LDH 109 110 115  CRP 0.7 0.9 0.7     Lab Results  Component Value Date   SARSCOV2NAA POSITIVE (A) 12/01/2021    -SpO2: 100 %; Not requiring any Supplemental O2 via [place] -Airborne and Contract Precautions -Combivent 1 puff IH q6h -Started Molnupiravir 800 mg po BID x5 Days; -Antitussives with Robitussin DM and Tussionex  -Started Steroids Solumedrol 0.5 mg/kg x12 hours for 3 days and then 50 mg po  daily have now stopped since her CRP is not elevated -Start Zinc and Vitamin C -6/11 patient now off isolation  Elevated D-dimer - 6/10 patient with increasing D-dimer and lower extremity pain.  Although may be secondary to poor kidney function will obtain bilateral lower extremity Doppler  Hx of Gastric Ulcer/GERD/GI Prophylaxis -C/w PPI BID   Diabetes Mellitus Type 2 -Hold Home Glipizide -Check HbA1c and was 4.9 -Place on Moderate Novolog SSI AC/HS -Continue to Monitor and Trend Blood Sugars carefully  -CBGs ranging from 106-118   Hypothyroidism -Check TSH was  2.793 -C/w Levothyroxine 25 mcg po Daily   Acute on CKD stage IIIb/Nephrotic syndrome Lab Results  Component Value Date   CREATININE 4.76 (H) 12/12/2021   CREATININE 4.71 (H) 12/11/2021   CREATININE 4.06 (H) 12/10/2021   CREATININE 4.01 (H) 12/09/2021   CREATININE 3.80 (H) 12/08/2021  -Most likely hepatorenal syndrome -Nephrology managing diuresis - Strict in and out - Daily weight - 6/9 D5-0.9% saline 62m/hr -6/11 plan is for nephrology to biopsy kidney in the A.m. patient understands she may be heading toward HD -6/12 IR unable to perform renal biopsy today, plan is for renal biopsy on 6/13.  Following renal biopsy Dr. RR[facility]  Metabolic Acidosis -62/72resolved  Hyperphosphatemia -6/11 Will await nephrology recommendation on starting binder  Hyponatremia  -6/7 resolved  Normocytic Anemia -Patient has a history of iron deficiency and take iron supplementation but will hold this for now pending Anemia Panel -Patient's hemoglobin/hematocrit now 10.2/31.3 -> 8.5/25.8 -> 8.4/26.4 -> 8.0/25.3 -> 7.9/24.8 -> 8.9/27.8 -Continue to monitor for signs and symptoms of bleeding; no overt bleeding noted Lab Results  Component Value Date   HGB 8.0 (L) 12/12/2021  HGB 8.3 (L) 12/11/2021   HGB 8.6 (L) 12/10/2021   HGB 8.7 (L) 12/09/2021   HGB 8.5 (L) 12/08/2021     Hypoalbuminemia -Patient's Albumin Level went from 2.5 -> 2.3x2 -> 2.6 -Expected in liver failure   Thrombocytopenia -Mild, negative sign of overt bleeding.  Continue to monitor closely   Goals of care - 6/7 palliative care consult: Multisystem organ failure evaluate for patient CODE STATUS to DNR, given her serious multiple comorbidities patient requires HCPOA     Mobility Assessment (last 72 hours)     Mobility Assessment     Row Name 12/12/21 0715 12/11/21 2100 12/11/21 0720 12/10/21 2200 12/10/21 1515   Does patient have an order for  bedrest or is patient medically unstable No - Continue assessment No - Continue assessment No - Continue assessment No - Continue assessment No - Continue assessment   What is the highest level of mobility based on the progressive mobility assessment? Level 6 (Walks independently in room and hall) - Balance while walking in room without assist - Complete Level 6 (Walks independently in room and hall) - Balance while walking in room without assist - Complete Level 6 (Walks independently in room and hall) - Balance while walking in room without assist - Complete Level 6 (Walks independently in room and hall) - Balance while walking in room without assist - Complete Level 5 (Walks with assist in room/hall) - Balance while stepping forward/back and can walk in room with assist - Complete              Interdisciplinary Goals of Care Family Meeting   Date carried out: 12/12/2021  Location of the meeting:   Member's involved:   Durable Power of Tour manager:     Discussion: We discussed goals of care for [name] .    Code status:   Disposition:   Time spent for the meeting:     Allie Bossier, MD  12/12/2021, 7:15 PM         DVT prophylaxis:  Code Status: Full Family Communication:  Status is: Inpatient    Dispo: The patient is from: Home              Anticipated d/c is to: Home              Anticipated d/c date is: > 3 days              Patient currently is not medically stable to d/c.      Consultants:  Nephrology GI   Procedures/Significant Events:  6/2 Echocardiogram Left Ventricle: Left ventricular ejection fraction, by estimation, is 60  to 65%. The left ventricle has normal function. The left ventricle has no  regional wall motion abnormalities. The left ventricular internal cavity  size was normal in size. There is   no left ventricular hypertrophy. Left ventricular diastolic parameters  are consistent with  Grade II diastolic dysfunction (pseudonormalization).    I have personally reviewed and interpreted all radiology studies and my findings are as above.  VENTILATOR SETTINGS:    Cultures   Antimicrobials: Anti-infectives (From admission, onward)    Start     Ordered Stop   12/01/21 1900  molnupiravir EUA (LAGEVRIO) capsule 800 mg        12/01/21 1546 12/06/21 0904         Devices    LINES / TUBES:      Continuous Infusions:  Objective: Vitals:   12/11/21 2031 12/12/21 0500 12/12/21 0538 12/12/21 1530  BP: 109/62  121/63 112/72  Pulse: 73  72 79  Resp: '14  16 16  ' Temp: 98.5 F (36.9 C)  98.7 F (37.1 C) 98.5 F (36.9 C)  TempSrc: Oral  Oral Oral  SpO2: 100%  99% 100%  Weight:  64.9 kg    Height:        Intake/Output Summary (Last 24 hours) at 12/12/2021 1915 Last data filed at 12/12/2021 0536 Gross per 24 hour  Intake --  Output 300 ml  Net -300 ml    Filed Weights   12/10/21 0500 12/11/21 0500 12/12/21 0500  Weight: 61.2 kg 62.6 kg 64.9 kg    Examination:  General: A/O x4 No acute respiratory distress Eyes: negative scleral hemorrhage, negative anisocoria, negative icterus ENT: Negative Runny nose, negative gingival bleeding, Neck:  Negative scars, masses, torticollis, lymphadenopathy, JVD Lungs: Clear to auscultation bilaterally without wheezes or crackles Cardiovascular: Regular rate and rhythm without murmur gallop or rub normal S1 and S2 Abdomen: negative abdominal pain, positive distended (ascites), positive soft, bowel sounds, no rebound, no ascites, no appreciable mass Extremities: No significant cyanosis, clubbing, or edema bilateral lower extremities Skin: Negative rashes, lesions, ulcers Psychiatric:  Negative depression, negative anxiety, negative fatigue, negative mania  Central nervous system:  Cranial nerves II through XII intact, tongue/uvula midline, all extremities muscle strength 5/5, sensation intact throughout,  negative  dysarthria, negative expressive aphasia, negative receptive aphasia.  .     Data Reviewed: Care during the described time interval was provided by me .  I have reviewed this patient's available data, including medical history, events of note, physical examination, and all test results as part of my evaluation.  CBC: Recent Labs  Lab 12/08/21 0538 12/09/21 0523 12/10/21 0623 12/11/21 0611 12/12/21 0502  WBC 3.7* 3.9* 4.5 5.1 4.7  NEUTROABS 2.0 2.1 2.9 3.3 3.1  HGB 8.5* 8.7* 8.6* 8.3* 8.0*  HCT 26.3* 26.6* 26.8* 26.1* 25.2*  MCV 96.3 95.3 95.7 96.7 96.6  PLT 104* 108* 103* 97* 94*    Basic Metabolic Panel: Recent Labs  Lab 12/08/21 0538 12/09/21 0523 12/10/21 0623 12/11/21 0611 12/12/21 0502  NA 138 140 140 139 138  K 3.7 3.9 3.9 4.2 4.4  CL 104 107 110 109 108  CO2 '24 24 23 ' 21* 22  GLUCOSE 93 90 118* 132* 112*  BUN 58* 56* 56* 61* 62*  CREATININE 3.80* 4.01* 4.06* 4.71* 4.76*  CALCIUM 8.3* 8.1* 8.3* 8.3* 8.6*  MG 1.9 1.9 2.2 2.1 2.1  PHOS 4.8* 4.3 4.6 4.1 3.8    GFR: Estimated Creatinine Clearance: 9 mL/min (A) (by C-G formula based on SCr of 4.76 mg/dL (H)). Liver Function Tests: Recent Labs  Lab 12/08/21 0538 12/09/21 0523 12/10/21 0623 12/11/21 0611 12/12/21 0502  AST '30 30 27 26 26  ' ALT '18 17 17 17 18  ' ALKPHOS 95 96 94 96 88  BILITOT 0.8 0.9 0.9 0.7 0.7  PROT 6.3* 6.2* 6.3* 6.5 6.3*  ALBUMIN 2.7* 2.7* 2.7* 2.9* 2.8*    No results for input(s): "LIPASE", "AMYLASE" in the last 168 hours.  Recent Labs  Lab 12/08/21 0538 12/09/21 0523 12/10/21 0623 12/11/21 0611 12/12/21 0502  AMMONIA 44* 63* 42* 114* 79*    Coagulation Profile: Recent Labs  Lab 12/08/21 1026 12/10/21 1621  INR 1.4* 1.3*    Cardiac Enzymes: No results for input(s): "CKTOTAL", "CKMB", "CKMBINDEX", "TROPONINI" in the last 168 hours. BNP (last 3  results) No results for input(s): "PROBNP" in the last 8760 hours. HbA1C: No results for input(s): "HGBA1C" in the last 72  hours. CBG: Recent Labs  Lab 12/11/21 1654 12/11/21 2254 12/12/21 0756 12/12/21 1205 12/12/21 1720  GLUCAP 109* 121* 107* 114* 134*    Lipid Profile: No results for input(s): "CHOL", "HDL", "LDLCALC", "TRIG", "CHOLHDL", "LDLDIRECT" in the last 72 hours. Thyroid Function Tests: No results for input(s): "TSH", "T4TOTAL", "FREET4", "T3FREE", "THYROIDAB" in the last 72 hours. Anemia Panel: Recent Labs    12/11/21 0611 12/12/21 0502  FERRITIN 28 26    Sepsis Labs: No results for input(s): "PROCALCITON", "LATICACIDVEN" in the last 168 hours.   No results found for this or any previous visit (from the past 240 hour(s)).        Radiology Studies: No results found.      Scheduled Meds:  amLODipine  10 mg Oral Daily   vitamin C  500 mg Oral Daily   docusate sodium  100 mg Oral BID   folic acid  1 mg Oral Daily   [START ON 12/13/2021] heparin injection (subcutaneous)  5,000 Units Subcutaneous Q8H   hydrALAZINE  25 mg Oral Q8H   insulin aspart  0-15 Units Subcutaneous TID WC   insulin aspart  0-5 Units Subcutaneous QHS   levothyroxine  25 mcg Oral Daily   melatonin  3 mg Oral Once   metoprolol succinate  50 mg Oral Daily   multivitamin with minerals  1 tablet Oral Daily   pantoprazole  40 mg Oral BID   thiamine  100 mg Oral Daily   zinc sulfate  220 mg Oral Daily   Continuous Infusions:     LOS: 11 days    Time spent:40 min    Kamora Vossler, Geraldo Docker, MD [facility]   If 7PM-7AM, please contact night-coverage 12/12/2021, 7:15 PM

## 2021-12-13 ENCOUNTER — Inpatient Hospital Stay (HOSPITAL_COMMUNITY): Payer: Medicare Other

## 2021-12-13 DIAGNOSIS — K7460 Unspecified cirrhosis of liver: Secondary | ICD-10-CM | POA: Diagnosis present

## 2021-12-13 DIAGNOSIS — N179 Acute kidney failure, unspecified: Secondary | ICD-10-CM | POA: Diagnosis not present

## 2021-12-13 DIAGNOSIS — E118 Type 2 diabetes mellitus with unspecified complications: Secondary | ICD-10-CM | POA: Diagnosis not present

## 2021-12-13 DIAGNOSIS — I10 Essential (primary) hypertension: Secondary | ICD-10-CM | POA: Diagnosis not present

## 2021-12-13 DIAGNOSIS — K7031 Alcoholic cirrhosis of liver with ascites: Secondary | ICD-10-CM | POA: Diagnosis not present

## 2021-12-13 LAB — COMPREHENSIVE METABOLIC PANEL
ALT: 17 U/L (ref 0–44)
AST: 27 U/L (ref 15–41)
Albumin: 2.8 g/dL — ABNORMAL LOW (ref 3.5–5.0)
Alkaline Phosphatase: 84 U/L (ref 38–126)
Anion gap: 9 (ref 5–15)
BUN: 62 mg/dL — ABNORMAL HIGH (ref 8–23)
CO2: 20 mmol/L — ABNORMAL LOW (ref 22–32)
Calcium: 8.3 mg/dL — ABNORMAL LOW (ref 8.9–10.3)
Chloride: 107 mmol/L (ref 98–111)
Creatinine, Ser: 4.77 mg/dL — ABNORMAL HIGH (ref 0.44–1.00)
GFR, Estimated: 9 mL/min — ABNORMAL LOW (ref >60)
Glucose, Bld: 97 mg/dL (ref 70–99)
Potassium: 4.4 mmol/L (ref 3.5–5.1)
Sodium: 136 mmol/L (ref 135–145)
Total Bilirubin: 0.7 mg/dL (ref 0.3–1.2)
Total Protein: 6.6 g/dL (ref 6.5–8.1)

## 2021-12-13 LAB — AMMONIA: Ammonia: 50 umol/L — ABNORMAL HIGH (ref 9–35)

## 2021-12-13 LAB — CBC WITH DIFFERENTIAL/PLATELET
Abs Immature Granulocytes: 0.01 10*3/uL (ref 0.00–0.07)
Basophils Absolute: 0.1 10*3/uL (ref 0.0–0.1)
Basophils Relative: 1 %
Eosinophils Absolute: 0.2 10*3/uL (ref 0.0–0.5)
Eosinophils Relative: 3 %
HCT: 25.8 % — ABNORMAL LOW (ref 36.0–46.0)
Hemoglobin: 8.2 g/dL — ABNORMAL LOW (ref 12.0–15.0)
Immature Granulocytes: 0 %
Lymphocytes Relative: 20 %
Lymphs Abs: 1.1 10*3/uL (ref 0.7–4.0)
MCH: 30.3 pg (ref 26.0–34.0)
MCHC: 31.8 g/dL (ref 30.0–36.0)
MCV: 95.2 fL (ref 80.0–100.0)
Monocytes Absolute: 0.5 10*3/uL (ref 0.1–1.0)
Monocytes Relative: 10 %
Neutro Abs: 3.5 10*3/uL (ref 1.7–7.7)
Neutrophils Relative %: 66 %
Platelets: 105 10*3/uL — ABNORMAL LOW (ref 150–400)
RBC: 2.71 MIL/uL — ABNORMAL LOW (ref 3.87–5.11)
RDW: 13.8 % (ref 11.5–15.5)
WBC: 5.3 10*3/uL (ref 4.0–10.5)
nRBC: 0 % (ref 0.0–0.2)

## 2021-12-13 LAB — GLUCOSE, CAPILLARY
Glucose-Capillary: 89 mg/dL (ref 70–99)
Glucose-Capillary: 112 mg/dL — ABNORMAL HIGH (ref 70–99)
Glucose-Capillary: 96 mg/dL (ref 70–99)
Glucose-Capillary: 174 mg/dL — ABNORMAL HIGH (ref 70–99)

## 2021-12-13 LAB — SURGICAL PATHOLOGY

## 2021-12-13 LAB — MAGNESIUM: Magnesium: 2.1 mg/dL (ref 1.7–2.4)

## 2021-12-13 LAB — FERRITIN: Ferritin: 26 ng/mL (ref 11–307)

## 2021-12-13 LAB — LACTATE DEHYDROGENASE: LDH: 116 U/L (ref 98–192)

## 2021-12-13 LAB — C-REACTIVE PROTEIN: CRP: 0.7 mg/dL (ref <1.0)

## 2021-12-13 LAB — PHOSPHORUS: Phosphorus: 4.2 mg/dL (ref 2.5–4.6)

## 2021-12-13 LAB — D-DIMER, QUANTITATIVE: D-Dimer, Quant: 5.07 ug/mL-FEU — ABNORMAL HIGH (ref 0.00–0.50)

## 2021-12-13 MED ORDER — FENTANYL CITRATE (PF) 100 MCG/2ML IJ SOLN
INTRAMUSCULAR | Status: AC | PRN
  Administered 2021-12-13: 50 ug via INTRAVENOUS

## 2021-12-13 MED ORDER — LIDOCAINE HCL 1 % IJ SOLN
INTRAMUSCULAR | Status: AC
  Administered 2021-12-13: 10 mL
  Filled 2021-12-13: qty 20

## 2021-12-13 MED ORDER — MIDAZOLAM HCL 2 MG/2ML IJ SOLN
INTRAMUSCULAR | Status: AC
  Filled 2021-12-13: qty 2

## 2021-12-13 MED ORDER — FENTANYL CITRATE (PF) 100 MCG/2ML IJ SOLN
INTRAMUSCULAR | Status: AC
  Filled 2021-12-13: qty 2

## 2021-12-13 MED ORDER — MIDAZOLAM HCL 2 MG/2ML IJ SOLN
INTRAMUSCULAR | Status: AC | PRN
  Administered 2021-12-13: 1 mg via INTRAVENOUS

## 2021-12-13 MED ORDER — GELATIN ABSORBABLE 12-7 MM EX MISC
CUTANEOUS | Status: AC
  Administered 2021-12-13: 1
  Filled 2021-12-13: qty 1

## 2021-12-13 NOTE — Progress Notes (Signed)
PROGRESS NOTE    Stacey Chambers  QKM:[id number] DOB: 03-Jan-1951 DOA: 12/01/2021 PCP: Trey Sailors, PA     Brief Narrative:  71 y.o. HF ([name] speaker) PMHx chronic iron deficiency anemia, diabetes mellitus type 2, hypertension, hyperlipidemia, hypothyroidism, history of CKD stage IIIb as well as other comorbidities who presents with worsening lower extremity swelling and elevated blood pressure.  Patient states that she started feeling fatigued and tired since Friday of last week is noticed that her legs started having worsening swelling.  She states that whenever she ambulates she gets very dyspneic on exertion and very fatigued.  States that she denies any abdominal pain and denies any cough, chest pain or lightheadedness or dizziness.  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  She had lower extremity swelling and given her dyspnea on exertion we will obtain an echocardiogram and evaluate for heart failure.  She is also noted to have AKI on CKD stage IIIb.  Incidentally she was found to be COVID-positive.  Gastroenterology was consulted and [facility] was asked to admit this patient for lower extremity swelling.  Interventional radiology was consulted for a paracentesis and this has been done and she underwent a diagnostic tap.   **Interim History  Her edema has improved however renal function is worsened.  She is not complaining of any chest pain or shortness of breath.  Abdomen is doing okay today.  She has been consulted and doing further work-up and nephrology is also been consulted given her worsening renal function   Nephrology has placed the patient on low-dose IV fluids at 65 MLS per hour but now have reduced it to 40 mL/hour now that creatinine is leveling off.  Chest x-ray done today and "New symmetric airspace disease, most  likely due to pulmonary edema. Suspect small layering bilateral pleural effusions" to keep a close eye on this.   Nephrology evaluated and given the emergence of vascular congestion on chest x-ray there is discontinued IV fluids at this time and agreed to give her a trial of albumin and Lasix now to help augment diuresis.  After the Lasix she felt a little dizzy though   Subjective: 6/13 A/O x4, sitting in bed comfortably.  Negative abdominal pain.  Anxious about her upcoming biopsy and possible HD.   Assessment & Plan: Covid vaccination;   Principal Problem:   Cirrhosis ([place]) Active Problems:   History of gastric ulcer   History of thrombocytopenia   History of iron deficiency anemia   Gastric ulcer   Dyspnea on exertion   Swelling of lower extremity   Acute kidney injury superimposed on CKD ([place])   Diabetes mellitus type 2 with complications (HCC)   Acute diastolic CHF (congestive heart failure) ([place])   2019 novel coronavirus disease (COVID-19)   Hypertensive urgency   Diabetes mellitus type 2, controlled, without complications ([place])   Acute renal failure superimposed on stage 3b chronic kidney disease (HCC)   Nephrotic syndrome   Normocytic anemia   Low serum albumin   Thrombocytopenia (HCC)   Liver disease, chronic, with cirrhosis ([place])  New onset Decompensated liver cirrhosis with portal HTN/mild splenomegaly/moderate ascites  -Unclear etiology of her liver cirrhosis; consulted GI for further evaluation  -Acute hepatitis panel negative -Diagnostic paracentesis: Aspirated 161m sent for pathology: Not consistent with SBP -SAAG > 1.1 consistent with portal hypertension/cirrhosis. -6/8 discussed case with Dr.  Cunningham [facility] plan is for A.m. biopsy of liver - Hx EtOH abuse - 6/2 Echocardiogram EF of 60-65% and G2DD.  See results below -Query EtOH associated cirrhosis.   -Urine protein > 300.  -No focal lesion on unenhanced CT scan.  -AFP WNL -.HBsAg, HCV ab, ANA, AMA and  A-1 antitrypsin Negative.  -No evidence for hemochromatosis. Ceruloplasmin low at 16.4. Serum copper level pending.  -ASMA elevated at 52.  -Negative ANA. IgG pending.   -Total bili 0.8.  Alk phos 95.  AST 30.  ALT 18.  No evidence of hepatic encephalopathy. -Ammonia =44.   -INR/PT. Antimicrosomal Ab -LK. -Calculate MELD score after PT/INR completed  -Eventual 24 hr urine copper to rule out Wilson's disease  -Possible transjugular liver biopsy pending  -2gm low sodium diet  -6/9 s/p liver biopsy -6/12 results of liver biopsy cirrhosis of uncertain etiology  Liver cirrhosis - 6/13 GI will see patient tomorrow will await recommendations  Dyspnea on Exertion r/o CHF (Unclear type) -6/8 resolved  Acute Diastolic CHF -On admission BNP on admission 715 -Strict in and out +655.25m - Daily weight Filed Weights   12/10/21 0500 12/11/21 0500 12/12/21 0500  Weight: 61.2 kg 62.6 kg 64.9 kg     Hypertensive Urgency -BP was elevated on Admission -6/9 increase amlodipine 10 mg daily -6/9 Hydralazine IV PRN -6/9 Hydralazine 25 mg TID -6/9 Toprol 50 mg daily    COVID-19 Disease -Currently asymptomatic  COVID-19 Labs  Recent Labs    12/11/21 0611 12/12/21 0502 12/13/21 0509  DDIMER 6.60* 6.68* 5.07*  FERRITIN '28 26 26  ' LDH 110 115 116  CRP 0.9 0.7 0.7    Lab Results  Component Value Date   SARSCOV2NAA POSITIVE (A) 12/01/2021    -SpO2: 100 %; Not requiring any Supplemental O2 via [place] -Airborne and Contract Precautions -Combivent 1 puff IH q6h -Started Molnupiravir 800 mg po BID x5 Days; -Antitussives with Robitussin DM and Tussionex  -Started Steroids Solumedrol 0.5 mg/kg x12 hours for 3 days and then 50 mg po  daily have now stopped since her CRP is not elevated -Start Zinc and Vitamin C -6/11 patient now off isolation  Elevated D-dimer - 6/10 patient with increasing D-dimer and lower extremity pain.  Although may be secondary to poor kidney function will obtain  bilateral lower extremity Doppler  Hx of Gastric Ulcer/GERD/GI Prophylaxis -C/w PPI BID   Diabetes Mellitus Type 2 -Hold Home Glipizide -6/2 HbA1c=4.9 -Place on Moderate Novolog SSI AC/HS CBG (last 3)  Recent Labs    12/12/21 2244 12/13/21 0807 12/13/21 1225  GLUCAP 111* 89 112*     Hypothyroidism -Check TSH was 2.793 -C/w Levothyroxine 25 mcg po Daily   Acute on CKD stage IIIb/Nephrotic syndrome Lab Results  Component Value Date   CREATININE 4.77 (H) 12/13/2021   CREATININE 4.76 (H) 12/12/2021   CREATININE 4.71 (H) 12/11/2021   CREATININE 4.06 (H) 12/10/2021   CREATININE 4.01 (H) 12/09/2021  -Most likely hepatorenal syndrome -Nephrology managing diuresis - 6/9 D5-0.9% saline 545mhr -6/11 plan is for nephrology to biopsy kidney in the A.m. patient understands she may be heading toward HD -6/12 IR unable to perform renal biopsy today, plan is for renal biopsy on 6/13.  Following renal biopsy Dr. [facility]13 s/p renal biopsy. - 6/13 discussed case with Dr. RoRoney Jaffeephrology, and he wanted to hold on transferring patient to call.  Stated will take it day by day dependent  upon patient's renal recovery as to if she will need to be transferred for HD  Metabolic Acidosis -5/53 resolved  Hyperphosphatemia -6/11 Will await nephrology recommendation on starting binder  Hyponatremia  -6/7 resolved  Normocytic Anemia -Patient has a history of iron deficiency and take iron supplementation but will hold this for now pending Anemia Panel -Patient's hemoglobin/hematocrit now 10.2/31.3 -> 8.5/25.8 -> 8.4/26.4 -> 8.0/25.3 -> 7.9/24.8 -> 8.9/27.8 -Continue to monitor for signs and symptoms of bleeding; no overt bleeding noted Lab Results  Component Value Date   HGB 8.2 (L) 12/13/2021   HGB 8.0 (L) 12/12/2021   HGB 8.3 (L) 12/11/2021   HGB 8.6 (L) 12/10/2021   HGB 8.7 (L) 12/09/2021      Hypoalbuminemia -Patient's Albumin Level went from 2.5 -> 2.3x2 -> 2.6 -Expected in liver failure   Thrombocytopenia -Mild, negative sign of overt bleeding.  Continue to monitor closely    Goals of care - 6/7 palliative care consult: Multisystem organ failure evaluate for patient CODE STATUS to DNR, given her serious multiple comorbidities patient requires HCPOA     Mobility Assessment (last 72 hours)     Mobility Assessment     Row Name 12/12/21 0715 12/11/21 2100 12/11/21 0720 12/10/21 2200     Does patient have an order for bedrest or is patient medically unstable No - Continue assessment No - Continue assessment No - Continue assessment No - Continue assessment    What is the highest level of mobility based on the progressive mobility assessment? Level 6 (Walks independently in room and hall) - Balance while walking in room without assist - Complete Level 6 (Walks independently in room and hall) - Balance while walking in room without assist - Complete Level 6 (Walks independently in room and hall) - Balance while walking in room without assist - Complete Level 6 (Walks independently in room and hall) - Balance while walking in room without assist - Complete               Interdisciplinary Goals of Care Family Meeting   Date carried out: 12/13/2021  Location of the meeting:   Member's involved:   Durable Power of Tour manager:     Discussion: We discussed goals of care for Stacey Chambers .    Code status:   Disposition:   Time spent for the meeting:     Stacey Chambers J, MD  12/13/2021, 5:22 PM         DVT prophylaxis:  Code Status: Full Family Communication:  Status is: Inpatient    Dispo: The patient is from: Home              Anticipated d/c is to: Home              Anticipated d/c date is: > 3 days              Patient currently is not medically stable to d/c.      Consultants:   Nephrology GI   Procedures/Significant Events:  6/2 Echocardiogram Left Ventricle: Left ventricular ejection fraction, by estimation, is 60  to 65%. The left ventricle has normal function. The left ventricle has no  regional wall motion abnormalities. The left ventricular internal cavity  size was normal in size. There is   no left ventricular hypertrophy. Left ventricular diastolic parameters  are consistent with Grade II diastolic dysfunction (pseudonormalization).  6/9 s/p liver biopsy: cirrhosis of uncertain etiology 7/48 s/p renal biopsy  I have personally reviewed and interpreted all radiology studies and my findings are as above.  VENTILATOR SETTINGS:    Cultures   Antimicrobials: Anti-infectives (From admission, onward)    Start     Ordered Stop   12/01/21 1900  molnupiravir EUA (LAGEVRIO) capsule 800 mg        12/01/21 1546 12/06/21 0904         Devices    LINES / TUBES:      Continuous Infusions:     Objective: Vitals:   12/13/21 1555 12/13/21 1600 12/13/21 1605 12/13/21 1620  BP: 129/65 120/71 132/69 136/65  Pulse: 78 77  79  Resp: '16 15  16  ' Temp:    98.5 F (36.9 C)  TempSrc:    Temporal  SpO2: 100% 100%  98%  Weight:      Height:        Intake/Output Summary (Last 24 hours) at 12/13/2021 1722 Last data filed at 12/13/2021 0600 Gross per 24 hour  Intake --  Output 500 ml  Net -500 ml   Filed Weights   12/10/21 0500 12/11/21 0500 12/12/21 0500  Weight: 61.2 kg 62.6 kg 64.9 kg    Examination:  General: A/O x4 No acute respiratory distress Eyes: negative scleral hemorrhage, negative anisocoria, negative icterus ENT: Negative Runny nose, negative gingival bleeding, Neck:  Negative scars, masses, torticollis, lymphadenopathy, JVD Lungs: Clear to auscultation bilaterally without wheezes or crackles Cardiovascular: Regular rate and rhythm without murmur gallop or rub normal S1 and S2 Abdomen: negative abdominal pain, positive  distended (ascites), positive soft, bowel sounds, no rebound, no ascites, no appreciable mass Extremities: No significant cyanosis, clubbing, or edema bilateral lower extremities Skin: Negative rashes, lesions, ulcers Psychiatric:  Negative depression, negative anxiety, negative fatigue, negative mania  Central nervous system:  Cranial nerves II through XII intact, tongue/uvula midline, all extremities muscle strength 5/5, sensation intact throughout,  negative dysarthria, negative expressive aphasia, negative receptive aphasia.  .     Data Reviewed: Care during the described time interval was provided by me .  I have reviewed this patient's available data, including medical history, events of note, physical examination, and all test results as part of my evaluation.  CBC: Recent Labs  Lab 12/09/21 0523 12/10/21 0623 12/11/21 0611 12/12/21 0502 12/13/21 0509  WBC 3.9* 4.5 5.1 4.7 5.3  NEUTROABS 2.1 2.9 3.3 3.1 3.5  HGB 8.7* 8.6* 8.3* 8.0* 8.2*  HCT 26.6* 26.8* 26.1* 25.2* 25.8*  MCV 95.3 95.7 96.7 96.6 95.2  PLT 108* 103* 97* 94* 342*   Basic Metabolic Panel: Recent Labs  Lab 12/09/21 0523 12/10/21 0623 12/11/21 0611 12/12/21 0502 12/13/21 0509  NA 140 140 139 138 136  K 3.9 3.9 4.2 4.4 4.4  CL 107 110 109 108 107  CO2 24 23 21* 22 20*  GLUCOSE 90 118* 132* 112* 97  BUN 56* 56* 61* 62* 62*  CREATININE 4.01* 4.06* 4.71* 4.76* 4.77*  CALCIUM 8.1* 8.3* 8.3* 8.6* 8.3*  MG 1.9 2.2 2.1 2.1 2.1  PHOS 4.3 4.6 4.1 3.8 4.2   GFR: Estimated Creatinine Clearance: 9 mL/min (A) (by C-G formula based on SCr of 4.77 mg/dL (H)). Liver Function Tests: Recent Labs  Lab 12/09/21 0523 12/10/21 0623 12/11/21 0611 12/12/21 0502 12/13/21 0509  AST '30 27 26 26 27  ' ALT '17 17 17 18 17  ' ALKPHOS 96 94 96 88 84  BILITOT 0.9 0.9 0.7 0.7 0.7  PROT 6.2* 6.3* 6.5 6.3* 6.6  ALBUMIN 2.7* 2.7*  2.9* 2.8* 2.8*   No results for input(s): "LIPASE", "AMYLASE" in the last 168 hours.  Recent  Labs  Lab 12/09/21 0523 12/10/21 0623 12/11/21 0611 12/12/21 0502 12/13/21 0509  AMMONIA 63* 42* 114* 79* 50*   Coagulation Profile: Recent Labs  Lab 12/08/21 1026 12/10/21 1621  INR 1.4* 1.3*   Cardiac Enzymes: No results for input(s): "CKTOTAL", "CKMB", "CKMBINDEX", "TROPONINI" in the last 168 hours. BNP (last 3 results) No results for input(s): "PROBNP" in the last 8760 hours. HbA1C: No results for input(s): "HGBA1C" in the last 72 hours. CBG: Recent Labs  Lab 12/12/21 1205 12/12/21 1720 12/12/21 2244 12/13/21 0807 12/13/21 1225  GLUCAP 114* 134* 111* 89 112*   Lipid Profile: No results for input(s): "CHOL", "HDL", "LDLCALC", "TRIG", "CHOLHDL", "LDLDIRECT" in the last 72 hours. Thyroid Function Tests: No results for input(s): "TSH", "T4TOTAL", "FREET4", "T3FREE", "THYROIDAB" in the last 72 hours. Anemia Panel: Recent Labs    12/12/21 0502 12/13/21 0509  FERRITIN 26 26   Sepsis Labs: No results for input(s): "PROCALCITON", "LATICACIDVEN" in the last 168 hours.   No results found for this or any previous visit (from the past 240 hour(s)).        Radiology Studies: US BIOPSY (KIDNEY)  Result Date: 12/13/2021 INDICATION: Acute kidney injury, chronic kidney disease and need for renal biopsy. EXAM: ULTRASOUND GUIDED CORE BIOPSY OF LEFT KIDNEY MEDICATIONS: None. ANESTHESIA/SEDATION: Moderate (conscious) sedation was employed during this procedure. A total of Versed 2.0 mg and Fentanyl 100 mcg was administered intravenously by radiology nursing. Moderate Sedation Time: 15 minutes. The patient's level of consciousness and vital signs were monitored continuously by radiology nursing throughout the procedure under my direct supervision. PROCEDURE: The procedure, risks, benefits, and alternatives were explained to the patient. Questions regarding the procedure were encouraged and answered. The patient understands and consents to the procedure. Both kidneys were imaged  by ultrasound in a prone position. The left flank region was prepped with chlorhexidine in a sterile fashion, and a sterile drape was applied covering the operative field. A sterile gown and sterile gloves were used for the procedure. Local anesthesia was provided with 1% Lidocaine. The left kidney was chosen for biopsy. A 15 gauge trocar needle was advanced to the margin posterior renal cortex at roughly the level of the mid kidney. Two separate coaxial 16 gauge core biopsy samples were obtained and submitted in saline. A slurry of Gel-Foam pledgets mixed in sterile saline was then injected via the outer needle as the needle was retracted and removed. COMPLICATIONS: None immediate. FINDINGS: Both kidneys are small and echogenic. The left renal cortex was slightly thicker and better visualized posteriorly and therefore the left kidney was chosen for biopsy. Solid tissue was obtained. IMPRESSION: Ultrasound-guided core biopsy performed at the level of left renal cortex. Electronically Signed   By: Aletta Edouard M.D.   On: 12/13/2021 16:34        Scheduled Meds:  amLODipine  10 mg Oral Daily   vitamin C  500 mg Oral Daily   docusate sodium  100 mg Oral BID   fentaNYL       folic acid  1 mg Oral Daily   heparin injection (subcutaneous)  5,000 Units Subcutaneous Q8H   hydrALAZINE  25 mg Oral Q8H   insulin aspart  0-15 Units Subcutaneous TID WC   insulin aspart  0-5 Units Subcutaneous QHS   levothyroxine  25 mcg Oral Daily   melatonin  3 mg Oral Once   metoprolol  succinate  50 mg Oral Daily   midazolam       multivitamin with minerals  1 tablet Oral Daily   pantoprazole  40 mg Oral BID   thiamine  100 mg Oral Daily   zinc sulfate  220 mg Oral Daily   Continuous Infusions:     LOS: 12 days    Time spent:40 min    Kamarah Bilotta, Geraldo Docker, MD [facility]   If 7PM-7AM, please contact night-coverage 12/13/2021, 5:22 PM

## 2021-12-13 NOTE — Progress Notes (Signed)
Patient ID: Stacey Chambers, female   DOB: 23-Apr-1951, 71 y.o.   MRN: [id number]  Brief GI note  Liver biopsy-path still pending Patient is for kidney biopsy today  GI will see in follow-up tomorrow.

## 2021-12-13 NOTE — Progress Notes (Signed)
Patient ID: Stacey Chambers, female   DOB: 09-13-1950, 71 y.o.   MRN: [id number] [facility] Progress Note   Subjective:   Creat stable today at 4.7.  Going for biopsy this afternoon.    Objective:   BP 132/69   Pulse 77   Temp 98.4 F (36.9 C)   Resp 15   Ht '4\' 10"'  (1.473 m)   Wt 64.9 kg   SpO2 100%   BMI 29.90 kg/m   Intake/Output Summary (Last 24 hours) at 12/13/2021 1616 Last data filed at 12/13/2021 0600 Gross per 24 hour  Intake --  Output 500 ml  Net -500 ml    Weight change:   Physical Exam: [id number] resting in bed KGU:RKYHC regular rhythm and normal rate, normal s1 and s2 Resp: Clear to auscultation bilaterally, no rales/rhonchi. WCB:JSEG, slightly distended, non-tender, bowel sounds normal Ext: No lower extremity edema  Home meds include - norvasc 2.5, esomeprazole, ferous gluconate, fluticasone, levothyroxine, meteprolol succinate xl 50 qd, ergocalciferol       Date                         Creat               eGFR    July 2022                  1.41                 41 ml/min     Sept 2022                 1.69                 31 ml/min    Oct 2022                  1.85                     Feb 2023                  2.17                 23 ml/min    12/01/21                     2.93    12/02/21                     3.21                 15      UA - >50 rbcs, 0-5 wbcs, no bacteria, 0-5 epis, clear, yellow, mod Hb, > 300 prot     CXR - 6/01 > IMPRESSION: Central pulmonary vessels are prominent, possibly suggesting mild CHF. There is no focal pulmonary consolidation. Small transverse linear density in the left lower lung fields may suggest scarring or subsegmental atelectasis   UP/C ratio = 6.8 gm    UNa 48,  UCr 86    Renal US - 7.2 and 7.0 kidneys w/o hydro, + echotexture bilat    Urine sediment (6/03, undersigned) - showed numerous dysmorphic RBC's, one rbc cast and rare granular casts, mild pyuria.   Assessment/ Plan:   1. Acute  kidney Injury on CKD Stage IV: Baseline creatinine presumed to be about 2.1 with gradual progression over the past year suspected to be from hypertension given preceding history and  presentation with hypertensive urgency.  It appears less likely that she has type II HRS based on presentation and lack of response to earlier therapies.  The presence of RBCs with likely RBC fracture cast seen by Dr. Jonnie Finner on his analysis of the urine sediment raised concern for acute GN, however serologies including antinuclear antibody and ANCA were negative.  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  A renal biopsy is required at this point for both definitive diagnosis and prognosis. IR consulted, planning for biopsy today. Creat stable mid 4s, no uremic signs and no indication for HD yet. Will follow.  2. 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  Previously with low ceruloplasmin and an elevated anti-smooth muscle antibody with liver biopsy report pending-suspicion for autoimmune hepatitis.  3. HTN urgency: blood pressures better controlled w/ addition of hydralazine to amlodipine and metoprolol xl.  4. 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   [name], MD 12/13/2021, 4:16 PM  Recent Labs  Lab 12/12/21 0502 12/13/21 0509  HGB 8.0* 8.2*  ALBUMIN 2.8* 2.8*  CALCIUM 8.6* 8.3*  PHOS 3.8 4.2  CREATININE 4.76* 4.77*  K 4.4 4.4    Inpatient medications:  amLODipine  10 mg Oral Daily   vitamin C  500 mg Oral Daily   docusate sodium  100 mg Oral BID   fentaNYL       folic acid  1 mg Oral Daily   gelatin adsorbable       heparin injection (subcutaneous)  5,000 Units  Subcutaneous Q8H   hydrALAZINE  25 mg Oral Q8H   insulin aspart  0-15 Units Subcutaneous TID WC   insulin aspart  0-5 Units Subcutaneous QHS   levothyroxine  25 mcg Oral Daily   lidocaine       melatonin  3 mg Oral Once   metoprolol succinate  50 mg Oral Daily   midazolam       multivitamin with minerals  1 tablet Oral Daily   pantoprazole  40 mg Oral BID   thiamine  100 mg Oral Daily   zinc sulfate  220 mg Oral Daily    acetaminophen **OR** acetaminophen, chlorpheniramine-HYDROcodone, fentaNYL, fluticasone, gelatin adsorbable, guaiFENesin-dextromethorphan, hydrALAZINE, Ipratropium-Albuterol, lidocaine, melatonin, midazolam, ondansetron **OR** ondansetron (ZOFRAN) IV, oxyCODONE, polyethylene glycol, traZODone

## 2021-12-13 NOTE — Procedures (Signed)
Interventional Radiology Procedure Note  Procedure: US Guided Biopsy of left kidney  Complications: None  Estimated Blood Loss: < 10 mL  Findings: 1;6 G core biopsy of left renal cortex performed under US guidance.  Two core samples obtained and sent to Pathology.  Naidelyn Parrella T. Tatsuya Okray, M.D Pager:  [phone]    

## 2021-12-14 ENCOUNTER — Telehealth: Payer: Self-pay

## 2021-12-14 DIAGNOSIS — N179 Acute kidney failure, unspecified: Secondary | ICD-10-CM | POA: Diagnosis not present

## 2021-12-14 DIAGNOSIS — I5031 Acute diastolic (congestive) heart failure: Secondary | ICD-10-CM | POA: Diagnosis not present

## 2021-12-14 DIAGNOSIS — K766 Portal hypertension: Secondary | ICD-10-CM | POA: Diagnosis not present

## 2021-12-14 DIAGNOSIS — N189 Chronic kidney disease, unspecified: Secondary | ICD-10-CM | POA: Diagnosis not present

## 2021-12-14 DIAGNOSIS — K7031 Alcoholic cirrhosis of liver with ascites: Secondary | ICD-10-CM | POA: Diagnosis not present

## 2021-12-14 LAB — CBC WITH DIFFERENTIAL/PLATELET
Abs Immature Granulocytes: 0.01 10*3/uL (ref 0.00–0.07)
Basophils Absolute: 0 10*3/uL (ref 0.0–0.1)
Basophils Relative: 1 %
Eosinophils Absolute: 0.2 10*3/uL (ref 0.0–0.5)
Eosinophils Relative: 3 %
HCT: 25.4 % — ABNORMAL LOW (ref 36.0–46.0)
Hemoglobin: 8.2 g/dL — ABNORMAL LOW (ref 12.0–15.0)
Immature Granulocytes: 0 %
Lymphocytes Relative: 17 %
Lymphs Abs: 0.9 10*3/uL (ref 0.7–4.0)
MCH: 30.9 pg (ref 26.0–34.0)
MCHC: 32.3 g/dL (ref 30.0–36.0)
MCV: 95.8 fL (ref 80.0–100.0)
Monocytes Absolute: 0.5 10*3/uL (ref 0.1–1.0)
Monocytes Relative: 11 %
Neutro Abs: 3.4 10*3/uL (ref 1.7–7.7)
Neutrophils Relative %: 68 %
Platelets: 101 10*3/uL — ABNORMAL LOW (ref 150–400)
RBC: 2.65 MIL/uL — ABNORMAL LOW (ref 3.87–5.11)
RDW: 13.8 % (ref 11.5–15.5)
WBC: 5.1 10*3/uL (ref 4.0–10.5)
nRBC: 0 % (ref 0.0–0.2)

## 2021-12-14 LAB — BASIC METABOLIC PANEL
Anion gap: 9 (ref 5–15)
BUN: 62 mg/dL — ABNORMAL HIGH (ref 8–23)
CO2: 20 mmol/L — ABNORMAL LOW (ref 22–32)
Calcium: 8.7 mg/dL — ABNORMAL LOW (ref 8.9–10.3)
Chloride: 108 mmol/L (ref 98–111)
Creatinine, Ser: 4.69 mg/dL — ABNORMAL HIGH (ref 0.44–1.00)
GFR, Estimated: 10 mL/min — ABNORMAL LOW (ref >60)
Glucose, Bld: 119 mg/dL — ABNORMAL HIGH (ref 70–99)
Potassium: 4.5 mmol/L (ref 3.5–5.1)
Sodium: 137 mmol/L (ref 135–145)

## 2021-12-14 LAB — GLUCOSE, CAPILLARY
Glucose-Capillary: 109 mg/dL — ABNORMAL HIGH (ref 70–99)
Glucose-Capillary: 130 mg/dL — ABNORMAL HIGH (ref 70–99)
Glucose-Capillary: 149 mg/dL — ABNORMAL HIGH (ref 70–99)
Glucose-Capillary: 140 mg/dL — ABNORMAL HIGH (ref 70–99)

## 2021-12-14 LAB — AMMONIA: Ammonia: 69 umol/L — ABNORMAL HIGH (ref 9–35)

## 2021-12-14 NOTE — Progress Notes (Addendum)
[facility] Progress Note  Subjective: pt w/o new c/o's.   Vitals:   12/13/21 1620 12/13/21 2040 12/14/21 0430 12/14/21 0857  BP: 136/65 (!) 143/62 (!) 132/59 (!) 134/56  Pulse: 79 82 85 85  Resp: '16 20 20   ' Temp: 98.5 F (36.9 C) 98.4 F (36.9 C) 98.2 F (36.8 C) 98.5 F (36.9 C)  TempSrc: Temporal Oral Oral Oral  SpO2: 98% 97% 99% 100%  Weight:      Height:        Exam: PJK:[id number] resting in bed DXI:PJASN regular rhythm and normal rate, normal s1 and s2 Resp: Clear to auscultation bilaterally, no rales/rhonchi. KNL:ZJQB, slightly distended, non-tender, bowel sounds normal Ext: No lower extremity edema  Home meds include - norvasc 2.5, esomeprazole, ferous gluconate, fluticasone, levothyroxine, meteprolol succinate xl 50 qd, ergocalciferol       Date                         Creat               eGFR    July 2022                  1.41                 41 ml/min     Sept 2022                 1.69                 31 ml/min    Oct 2022                  1.85                     Feb 2023                  2.17                 23 ml/min    12/01/21                     2.93    12/02/21                     3.21                 15      UA - >50 rbcs, 0-5 wbcs, no bacteria, 0-5 epis, clear, yellow, mod Hb, > 300 prot     CXR - 6/01 > IMPRESSION: Central pulmonary vessels are prominent, possibly suggesting mild CHF. There is no focal pulmonary consolidation. Small transverse linear density in the left lower lung fields may suggest scarring or subsegmental atelectasis   UP/C ratio = 6.8 gm    UNa 48,  UCr 86    Renal US - 7.2 and 7.0 kidneys w/o hydro, + echotexture bilat    Urine sediment (6/03, undersigned) - showed numerous dysmorphic RBC's, one rbc cast and rare granular casts, mild pyuria.   Assessment/ Plan: Acute kidney Injury on CKD Stage IV: Baseline creatinine presumed to be about 2.1 with gradual progression over the past year suspected to be from  hypertension given preceding history and presentation with hypertensive urgency.  Less likely that she has type II HRS based on presentation and lack of response to earlier therapies.  The presence of RBCs with likely RBC fracture cast  seen by Dr. Jonnie Finner on analysis of the urine sediment raised concern for acute GN, however serologies including antinuclear antibody and ANCA were negative.  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  We recommended biopsy for diagnosis/ prognosis and this was done 6/13 by IR. Creat stable mid 4s, improving slightly now. Cont supportive care. Will follow.  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  Previously with low ceruloplasmin and an elevated anti-smooth muscle antibody with liver biopsy report pending-suspicion for autoimmune hepatitis.  HTN urgency: blood pressures better controlled w/ addition of hydralazine to amlodipine and metoprolol xl.  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   [name], MD 12/14/2021, 9:49 AM  Recent Labs  Lab 12/12/21 0502 12/13/21 0509 12/14/21 0605  HGB 8.0* 8.2* 8.2*  ALBUMIN 2.8* 2.8*  --   CALCIUM 8.6* 8.3* 8.7*  PHOS 3.8 4.2  --   CREATININE 4.76* 4.77* 4.69*  K 4.4 4.4 4.5   Inpatient medications:  amLODipine  10 mg Oral Daily   vitamin C  500 mg Oral Daily   docusate sodium  100 mg Oral BID   folic acid  1 mg Oral Daily   heparin injection (subcutaneous)  5,000 Units Subcutaneous Q8H   hydrALAZINE  25 mg Oral Q8H   insulin aspart  0-15 Units Subcutaneous TID WC   insulin aspart  0-5 Units Subcutaneous QHS   levothyroxine  25 mcg Oral Daily   melatonin  3 mg Oral Once    metoprolol succinate  50 mg Oral Daily   multivitamin with minerals  1 tablet Oral Daily   pantoprazole  40 mg Oral BID   thiamine  100 mg Oral Daily   zinc sulfate  220 mg Oral Daily    acetaminophen **OR** acetaminophen, chlorpheniramine-HYDROcodone, fluticasone, guaiFENesin-dextromethorphan, hydrALAZINE, Ipratropium-Albuterol, melatonin, ondansetron **OR** ondansetron (ZOFRAN) IV, oxyCODONE, polyethylene glycol, traZODone

## 2021-12-14 NOTE — Telephone Encounter (Signed)
The pt has an appt on 8/8 at 950 am with Dr Rush Landmark. She will be notified at discharge. Letter also mailed

## 2021-12-14 NOTE — Plan of Care (Signed)

## 2021-12-14 NOTE — Progress Notes (Signed)
Patient ID: Stacey Chambers, female   DOB: 1950/11/03, 71 y.o.   MRN: [id number]    Progress Note   Subjective   Day # 12 CC; new diagnosis of cirrhosis, progressive renal failure, COVID-19 infection  Renal biopsy completed yesterday pending Liver biopsy-cirrhosis uncertain etiology-differential can include burned-out steatohepatitis, chronic viral hepatitis or autoimmune hepatitis  Urine copper-pending  WBC 5.1/hemoglobin 8.2/hematocrit 25.4/platelets 1 1 BUN 62/creatinine 4.69 stable/GFR 10 Ammonia 69 MELD 23 (Negative ANA, ceruloplasmin low at 16.4, ASMA elevated at 52)  Communicated with patient via interpreter-she is feeling okay, eating without difficulty, had some mild soreness at the biopsy site last evening in her back, that has resolved, no complaints of abdominal discomfort.   Objective   Vital signs in last 24 hours: Temp:  [98.2 F (36.8 C)-98.5 F (36.9 C)] 98.5 F (36.9 C) (06/14 0857) Pulse Rate:  [75-85] 85 (06/14 0857) Resp:  [14-20] 20 (06/14 0430) BP: (120-154)/(56-71) 134/56 (06/14 0857) SpO2:  [97 %-100 %] 100 % (06/14 0857) Last BM Date : 12/13/21 General:    Older Hispanic female in NAD Heart:  Regular rate and rhythm; no murmurs Lungs: Respirations even and unlabored, lungs CTA bilaterally Abdomen:  Soft, nontense ascites, nondistended. Normal bowel sounds. Extremities:  Without edema. Neurologic:  Alert and oriented,  grossly normal neurologically. Psych:  Cooperative. Normal mood and affect.  Intake/Output from previous day: No intake/output data recorded. Intake/Output this shift: No intake/output data recorded.  Lab Results: Recent Labs    12/12/21 0502 12/13/21 0509 12/14/21 0605  WBC 4.7 5.3 5.1  HGB 8.0* 8.2* 8.2*  HCT 25.2* 25.8* 25.4*  PLT 94* 105* 101*   BMET Recent Labs    12/12/21 0502 12/13/21 0509 12/14/21 0605  NA 138 136 137  K 4.4 4.4 4.5  CL 108 107 108  CO2 22 20* 20*  GLUCOSE 112* 97 119*  BUN 62*  62* 62*  CREATININE 4.76* 4.77* 4.69*  CALCIUM 8.6* 8.3* 8.7*   LFT Recent Labs    12/13/21 0509  PROT 6.6  ALBUMIN 2.8*  AST 27  ALT 17  ALKPHOS 84  BILITOT 0.7   PT/INR No results for input(s): "LABPROT", "INR" in the last 72 hours.  Studies/Results: US BIOPSY (KIDNEY)  Result Date: 12/13/2021 INDICATION: Acute kidney injury, chronic kidney disease and need for renal biopsy. EXAM: ULTRASOUND GUIDED CORE BIOPSY OF LEFT KIDNEY MEDICATIONS: None. ANESTHESIA/SEDATION: Moderate (conscious) sedation was employed during this procedure. A total of Versed 2.0 mg and Fentanyl 100 mcg was administered intravenously by radiology nursing. Moderate Sedation Time: 15 minutes. The patient's level of consciousness and vital signs were monitored continuously by radiology nursing throughout the procedure under my direct supervision. PROCEDURE: The procedure, risks, benefits, and alternatives were explained to the patient. Questions regarding the procedure were encouraged and answered. The patient understands and consents to the procedure. Both kidneys were imaged by ultrasound in a prone position. The left flank region was prepped with chlorhexidine in a sterile fashion, and a sterile drape was applied covering the operative field. A sterile gown and sterile gloves were used for the procedure. Local anesthesia was provided with 1% Lidocaine. The left kidney was chosen for biopsy. A 15 gauge trocar needle was advanced to the margin posterior renal cortex at roughly the level of the mid kidney. Two separate coaxial 16 gauge core biopsy samples were obtained and submitted in saline. A slurry of Gel-Foam pledgets mixed in sterile saline was then injected via the outer needle as the  needle was retracted and removed. COMPLICATIONS: None immediate. FINDINGS: Both kidneys are small and echogenic. The left renal cortex was slightly thicker and better visualized posteriorly and therefore the left kidney was chosen for  biopsy. Solid tissue was obtained. IMPRESSION: Ultrasound-guided core biopsy performed at the level of left renal cortex. Electronically Signed   By: Aletta Edouard M.D.   On: 12/13/2021 16:34       Assessment / Plan:    #74 71 year old Hispanic female with new diagnosis of cirrhosis, decompensated with mild splenomegaly and ascites M ELD=23  Liver biopsy has resulted-and is indeterminant as to definite cause for cirrhosis rule out burned-out steatohepatitis versus viral, could not rule out autoimmune  24-hour urine copper result pending  #2 acute on chronic kidney injury-chronic kidney disease stage IV Renal biopsy yesterday-pending Creatinine stable at 4.7 Nephrology following-not felt consistent with ATN/COVID associated AKI, suspicious for hypertensive/ischemic nephropathy with frank progression  #3 hypertension urgency-blood pressure control improved #4 COVID-19 infection-on admission-completed therapy  Plan; will discuss with team, as we do not have definite diagnosis of autoimmune hepatitis, no clear role for steroids Patient has been stable without diuretics in the past several days, continue 2 g sodium diet Needs complete EtOH abstinence   Principal Problem:   Cirrhosis ([place]) Active Problems:   History of gastric ulcer   History of thrombocytopenia   History of iron deficiency anemia   Gastric ulcer   Dyspnea on exertion   Swelling of lower extremity   Acute kidney injury superimposed on CKD ([place])   Diabetes mellitus type 2 with complications (HCC)   Acute diastolic CHF (congestive heart failure) ([place])   2019 novel coronavirus disease (COVID-19)   Hypertensive urgency   Diabetes mellitus type 2, controlled, without complications ([place])   Acute renal failure superimposed on stage 3b chronic kidney disease (HCC)   Nephrotic syndrome   Normocytic anemia   Low serum albumin   Thrombocytopenia (HCC)   Liver disease, chronic, with cirrhosis ([place])     LOS: 13 days    Rozann Holts PA-C 12/14/2021, 9:39 AM

## 2021-12-14 NOTE — Hospital Course (Signed)
PMHx chronic iron deficiency anemia, diabetes mellitus type 2, hypertension, hyperlipidemia, hypothyroidism, history of CKD stage IIIb as well as other comorbidities who presents with worsening lower extremity swelling and elevated blood pressure.  Patient states that she started feeling fatigued and tired since Friday of last week is noticed that her legs started having worsening swelling.  She states that whenever she ambulates she gets very dyspneic on exertion and very fatigued.  States that she denies any abdominal pain and denies any cough, chest pain or lightheadedness or dizziness.  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  She had lower extremity swelling and given her dyspnea on exertion we will obtain an echocardiogram and evaluate for heart failure.  She is also noted to have AKI on CKD stage IIIb.  Incidentally she was found to be COVID-positive.  Gastroenterology was consulted and [facility] was asked to admit this patient for lower extremity swelling.  Interventional radiology was consulted for a paracentesis and this has been done and she underwent a diagnostic tap.

## 2021-12-14 NOTE — Telephone Encounter (Signed)
-----   Message from Alfredia Ferguson, PA-C sent at 12/14/2021  3:46 PM EDT ----- Regarding: Stacey Chambers a Emile Ringgenberg - pt has been hospitalized - new dx of cirrhosis - please make her a follow up appt with Dr Rush Landmark - call her with the appt - she does not speak great English   Gabe - liver bx - nonspecific- not consistent with autoimmune She had renal bx yesterday - Creat 4.7  Renal following -likely D/C soon

## 2021-12-14 NOTE — Progress Notes (Signed)
Progress Note   Patient: Stacey Chambers DTO:[id number] DOB: 1950/10/04 DOA: 12/01/2021     13 DOS: the patient was seen and examined on 12/14/2021   Brief hospital course: PMHx chronic iron deficiency anemia, diabetes mellitus type 2, hypertension, hyperlipidemia, hypothyroidism, history of CKD stage IIIb as well as other comorbidities who presents with worsening lower extremity swelling and elevated blood pressure.  Patient states that she started feeling fatigued and tired since Friday of last week is noticed that her legs started having worsening swelling.  She states that whenever she ambulates she gets very dyspneic on exertion and very fatigued.  States that she denies any abdominal pain and denies any cough, chest pain or lightheadedness or dizziness.  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  She had lower extremity swelling and given her dyspnea on exertion we will obtain an echocardiogram and evaluate for heart failure.  She is also noted to have AKI on CKD stage IIIb.  Incidentally she was found to be COVID-positive.  Gastroenterology was consulted and [facility] was asked to admit this patient for lower extremity swelling.  Interventional radiology was consulted for a paracentesis and this has been done and she underwent a diagnostic tap.  Assessment and Plan: No notes have been filed under this hospital service. Service: Hospitalist New onset Decompensated liver cirrhosis with portal HTN/mild splenomegaly/moderate ascites  -Unclear etiology of her liver cirrhosis; consulted GI for further evaluation  -Acute hepatitis panel negative -Diagnostic paracentesis: Aspirated 124ml sent for pathology: Not consistent with SBP -SAAG > 1.1 consistent with portal hypertension/cirrhosis. - 6/2 Echocardiogram EF of 60-65% and G2DD. -No focal  lesion on unenhanced CT scan.  -AFP WNL -.HBsAg, HCV ab, ANA, AMA and A-1 antitrypsin Negative.  -No evidence for hemochromatosis. Ceruloplasmin low at 16.4 -ASMA elevated at 52.  -Negative ANA. IgG pending.   -INR/PT. Antimicrosomal Ab -LK. -Calculate MELD score after PT/INR completed  -Urine copper pending -2gm low sodium diet  -6/12 results of liver biopsy cirrhosis of uncertain etiology   Liver cirrhosis - 6/13 GI will see patient tomorrow will await recommendations   Dyspnea on Exertion r/o CHF (Unclear type) -6/8 resolved   Acute Diastolic CHF -On admission BNP on admission 715 - Cont daily weights    Hypertensive Urgency -BP was elevated on Admission -6/9 increase amlodipine 10 mg daily -6/9 Hydralazine IV PRN -6/9 Hydralazine 25 mg TID -6/9 Toprol 50 mg daily   COVID-19 Disease -Currently asymptomatic   COVID-19 Labs -6/11 patient now off isolation   Elevated D-dimer - BLE dopplers neg   Hx of Gastric Ulcer/GERD/GI Prophylaxis -C/w PPI BID   Diabetes Mellitus Type 2 -Hold Home Glipizide -6/2 HbA1c=4.9 -Place on Moderate Novolog SSI AC/HS  CBG (last 3)     Hypothyroidism -Check TSH was 2.793 -C/w Levothyroxine 25 mcg po Daily   Acute on CKD stage IIIb/Nephrotic syndrome -Most likely hepatorenal syndrome -Nephrology managing diuresis -6/11 plan is for nephrology to biopsy kidney in the A.m. patient understands she may be heading toward HD -6/12 IR unable to perform renal biopsy today, plan is for renal biopsy on 6/13.  Following renal biopsy Dr. Roney Jaffe nephrology requested patient be transferred to [facility] for HD -6/13 s/p renal biopsy. - 6/13 discussed case with Dr. Roney Jaffe nephrology, and he wanted to hold on transferring patient to call.  Stated will take it day  by day dependent upon patient's renal recovery as to if she will need to be transferred for HD   Metabolic Acidosis -7/86 resolved   Hyperphosphatemia    Hyponatremia  -6/7 resolved   Normocytic Anemia -hgb stable    Hypoalbuminemia -Stable   Thrombocytopenia -stable, no evidence of acute blood loss       Goals of care - 6/7 palliative care consult: Multisystem organ failure evaluate for patient CODE STATUS to DNR, given her serious multiple comorbidities patient requires HCPOA        Subjective: Without complaints this afternoon  Physical Exam: Vitals:   12/13/21 2040 12/14/21 0430 12/14/21 0857 12/14/21 1319  BP: (!) 143/62 (!) 132/59 (!) 134/56 (!) 104/41  Pulse: 82 85 85 70  Resp: 20 20  16   Temp: 98.4 F (36.9 C) 98.2 F (36.8 C) 98.5 F (36.9 C) (!) 97.5 F (36.4 C)  TempSrc: Oral Oral Oral Oral  SpO2: 97% 99% 100% 99%  Weight:      Height:       General exam: Awake, laying in bed, in nad Respiratory system: Normal respiratory effort, no wheezing Cardiovascular system: regular rate, s1, s2 Gastrointestinal system: Soft, nondistended, positive BS Central nervous system: CN2-12 grossly intact, strength intact Extremities: Perfused, no clubbing Skin: Normal skin turgor, no notable skin lesions seen Psychiatry: Mood normal // no visual hallucinations   Data Reviewed:  Labs reviewed: Cr 4.69  Family Communication: Pt in room, family not at bedside  Disposition: Status is: Inpatient Remains inpatient appropriate because: Severity of illness  Planned Discharge Destination: Home   Author: Marylu Lund, MD 12/14/2021 6:06 PM  For on call review [email or web address].

## 2021-12-15 ENCOUNTER — Other Ambulatory Visit: Payer: Self-pay

## 2021-12-15 DIAGNOSIS — U071 COVID-19: Secondary | ICD-10-CM | POA: Diagnosis not present

## 2021-12-15 DIAGNOSIS — K7031 Alcoholic cirrhosis of liver with ascites: Secondary | ICD-10-CM | POA: Diagnosis not present

## 2021-12-15 DIAGNOSIS — N179 Acute kidney failure, unspecified: Secondary | ICD-10-CM | POA: Diagnosis not present

## 2021-12-15 DIAGNOSIS — N189 Chronic kidney disease, unspecified: Secondary | ICD-10-CM | POA: Diagnosis not present

## 2021-12-15 LAB — CBC WITH DIFFERENTIAL/PLATELET
Abs Immature Granulocytes: 0.01 10*3/uL (ref 0.00–0.07)
Basophils Absolute: 0 10*3/uL (ref 0.0–0.1)
Basophils Relative: 1 %
Eosinophils Absolute: 0.2 10*3/uL (ref 0.0–0.5)
Eosinophils Relative: 3 %
HCT: 25.5 % — ABNORMAL LOW (ref 36.0–46.0)
Hemoglobin: 8.1 g/dL — ABNORMAL LOW (ref 12.0–15.0)
Immature Granulocytes: 0 %
Lymphocytes Relative: 22 %
Lymphs Abs: 1.2 10*3/uL (ref 0.7–4.0)
MCH: 30.5 pg (ref 26.0–34.0)
MCHC: 31.8 g/dL (ref 30.0–36.0)
MCV: 95.9 fL (ref 80.0–100.0)
Monocytes Absolute: 0.5 10*3/uL (ref 0.1–1.0)
Monocytes Relative: 9 %
Neutro Abs: 3.4 10*3/uL (ref 1.7–7.7)
Neutrophils Relative %: 65 %
Platelets: 102 10*3/uL — ABNORMAL LOW (ref 150–400)
RBC: 2.66 MIL/uL — ABNORMAL LOW (ref 3.87–5.11)
RDW: 13.7 % (ref 11.5–15.5)
WBC: 5.3 10*3/uL (ref 4.0–10.5)
nRBC: 0 % (ref 0.0–0.2)

## 2021-12-15 LAB — GLUCOSE, CAPILLARY
Glucose-Capillary: 96 mg/dL (ref 70–99)
Glucose-Capillary: 146 mg/dL — ABNORMAL HIGH (ref 70–99)
Glucose-Capillary: 164 mg/dL — ABNORMAL HIGH (ref 70–99)
Glucose-Capillary: 89 mg/dL (ref 70–99)

## 2021-12-15 LAB — BASIC METABOLIC PANEL
Anion gap: 8 (ref 5–15)
BUN: 61 mg/dL — ABNORMAL HIGH (ref 8–23)
CO2: 20 mmol/L — ABNORMAL LOW (ref 22–32)
Calcium: 8.3 mg/dL — ABNORMAL LOW (ref 8.9–10.3)
Chloride: 107 mmol/L (ref 98–111)
Creatinine, Ser: 4.78 mg/dL — ABNORMAL HIGH (ref 0.44–1.00)
GFR, Estimated: 9 mL/min — ABNORMAL LOW (ref >60)
Glucose, Bld: 95 mg/dL (ref 70–99)
Potassium: 4.8 mmol/L (ref 3.5–5.1)
Sodium: 135 mmol/L (ref 135–145)

## 2021-12-15 LAB — COPPER, URINE - RANDOM OR 24 HOUR
Copper / Creatinine Ratio: 63 ug/g creat — ABNORMAL HIGH (ref 0–49)
Copper, 24H Ur: 52 ug/24 hr — ABNORMAL HIGH (ref 3–35)
Copper, Ur: 57 ug/L
Creatinine(Crt),U: 0.9 g/L (ref 0.30–3.00)
Total Volume: 905

## 2021-12-15 LAB — AMMONIA: Ammonia: 65 umol/L — ABNORMAL HIGH (ref 9–35)

## 2021-12-15 MED ORDER — CHLORHEXIDINE GLUCONATE CLOTH 2 % EX PADS
6.0000 | MEDICATED_PAD | Freq: Every day | CUTANEOUS | Status: DC
  Administered 2021-12-17 – 2021-12-21 (×3): 6 via TOPICAL

## 2021-12-15 NOTE — Progress Notes (Signed)
[facility] Progress Note  CC:  Cirrhosis, newly diagnosed   Subjective: [employer] [name] interpreter service utilized to communicate with the patient.  She is ambulating up in the room.  No nausea vomiting.  No abdominal pain.  She passed a normal bowel movement yesterday.  No chest pain or shortness of breath.  Objective:  Vital signs in last 24 hours: Temp:  [97.5 F (36.4 C)-98.2 F (36.8 C)] 98.1 F (36.7 C) (06/15 0303) Pulse Rate:  [70-78] 78 (06/15 1036) Resp:  [16] 16 (06/15 0303) BP: (104-125)/(41-66) 124/62 (06/15 1036) SpO2:  [99 %-100 %] 100 % (06/15 0303) Weight:  [64.4 kg] 64.4 kg (06/15 0455) Last BM Date : 12/13/21 General:   Alert, well-developed in NAD. Heart: Regular rate and rhythm, soft systolic murmur. Pulm: Breath sounds clear throughout.  Abdomen: Soft, nondistended.  Questionable small amount of ascites.  Positive bowel sounds to all 4 quadrants Extremities:  Without edema. Neurologic:  Alert and  oriented x 4. Grossly normal neurologically. Psych:  Alert and cooperative. Normal mood and affect.  Intake/Output from previous day: 06/14 0701 - 06/15 0700 In: 480 [P.O.:480] Out: 300 [Urine:300] Intake/Output this shift: No intake/output data recorded.  Lab Results: Recent Labs    12/13/21 0509 12/14/21 0605 12/15/21 0559  WBC 5.3 5.1 5.3  HGB 8.2* 8.2* 8.1*  HCT 25.8* 25.4* 25.5*  PLT 105* 101* 102*   BMET Recent Labs    12/13/21 0509 12/14/21 0605 12/15/21 0559  NA 136 137 135  K 4.4 4.5 4.8  CL 107 108 107  CO2 20* 20* 20*  GLUCOSE 97 119* 95  BUN 62* 62* 61*  CREATININE 4.77* 4.69* 4.78*  CALCIUM 8.3* 8.7* 8.3*   LFT Recent Labs    12/13/21 0509  PROT 6.6  ALBUMIN 2.8*  AST 27  ALT 17  ALKPHOS 84  BILITOT 0.7   PT/INR No results for input(s): "LABPROT", "INR" in the last 72 hours. Hepatitis Panel No results for input(s): "HEPBSAG", "HCVAB", "HEPAIGM", "HEPBIGM" in the last 72 hours.  US BIOPSY  (KIDNEY)  Result Date: 12/13/2021 INDICATION: Acute kidney injury, chronic kidney disease and need for renal biopsy. EXAM: ULTRASOUND GUIDED CORE BIOPSY OF LEFT KIDNEY MEDICATIONS: None. ANESTHESIA/SEDATION: Moderate (conscious) sedation was employed during this procedure. A total of Versed 2.0 mg and Fentanyl 100 mcg was administered intravenously by radiology nursing. Moderate Sedation Time: 15 minutes. The patient's level of consciousness and vital signs were monitored continuously by radiology nursing throughout the procedure under my direct supervision. PROCEDURE: The procedure, risks, benefits, and alternatives were explained to the patient. Questions regarding the procedure were encouraged and answered. The patient understands and consents to the procedure. Both kidneys were imaged by ultrasound in a prone position. The left flank region was prepped with chlorhexidine in a sterile fashion, and a sterile drape was applied covering the operative field. A sterile gown and sterile gloves were used for the procedure. Local anesthesia was provided with 1% Lidocaine. The left kidney was chosen for biopsy. A 15 gauge trocar needle was advanced to the margin posterior renal cortex at roughly the level of the mid kidney. Two separate coaxial 16 gauge core biopsy samples were obtained and submitted in saline. A slurry of Gel-Foam pledgets mixed in sterile saline was then injected via the outer needle as the needle was retracted and removed. COMPLICATIONS: None immediate. FINDINGS: Both kidneys are small and echogenic. The left renal cortex was slightly thicker and better visualized posteriorly and therefore  the left kidney was chosen for biopsy. Solid tissue was obtained. IMPRESSION: Ultrasound-guided core biopsy performed at the level of left renal cortex. Electronically Signed   By: Aletta Edouard M.D.   On: 12/13/2021 16:34    Assessment / Plan:  50) 71 year old female with newly diagnosed cirrhosis with  ascites, etiology unclear, suspect NASH +/- ASH.  MELD 23. S/P diagnostic paracentesis 6/1, 180cc peritoneal fluid removed. No SBP. SAAG > 1.1 consistent with portal hypertension/cirrhosis. ECHO with normal LV EF and RV function. No focal lesion on unenhanced CT scan. Normal AFP. Negative HBsAg, HCV ab, ANA, AMA and A-1 antitrypsin. No evidence for hemochromatosis. Ceruloplasmin low at 16.4, serum copper level low and 24hr urine copper level elevated. ASMA elevated at 52. LKM1 ab 1.0. Negative ANA. IgG 1508. S/P liver biopsy  6/9 showed cirrhosis (stage 4 of 4) of uncertain etiology. No evidence of Wilson's disease. Differential diagnosis includes burnt out steatohepatitis, chronic viral hepatitis or autoimmune hepatitis. Ammonia 65. No overt hepatic encephalopathy. -2 gm low-sodium diet -Eventual outpatient hepatology/liver transplant evaluation -Alcohol abstinence  -Await further recommendations per Dr. Tarri Glenn  2) AKI, Cr 4.69 -> 4.78. S/P renal biopsy 6/13, results pending   3) Covid 19 positive, asymptomatic. No longer requires PPE/isolation.   4) IDA. Hg 8.1. No overt GI bleeding. EGD 09/12/2021 showed a nonbleeding gastric ulcer.  No varices. Colonoscopy 09/12/2021 resulted in a poor prep, showed diverticulosis and hemorrhoids. Repeat colonoscopy in 6 to 12 months. -Continue Pantoprazole 40 mg p.o. twice daily -Eventual colonoscopy as an outpatient   5) Thrombocytopenia secondary to cirrhosis/mild splenomegaly.  PLT 102.    6) Coagulopathy secondary to liver cirrhosis.   7) Probable small stone in the region of the gallbladder neck per CT. RUQ sono noted a contracted gallbladder with sludge, possible gallbladder wall thickening without gallstones           Principal Problem:   Cirrhosis ([place]) Active Problems:   History of gastric ulcer   History of thrombocytopenia   History of iron deficiency anemia   Gastric ulcer   Dyspnea on exertion   Swelling of lower extremity   Acute  kidney injury superimposed on CKD ([place])   Diabetes mellitus type 2 with complications (HCC)   Acute diastolic CHF (congestive heart failure) ([place])   2019 novel coronavirus disease (COVID-19)   Hypertensive urgency   Diabetes mellitus type 2, controlled, without complications ([place])   Acute renal failure superimposed on stage 3b chronic kidney disease (HCC)   Nephrotic syndrome   Normocytic anemia   Low serum albumin   Thrombocytopenia (HCC)   Liver disease, chronic, with cirrhosis ([place])     LOS: 14 days   Stacey Chambers  12/15/2021, 10:49 AM

## 2021-12-15 NOTE — Progress Notes (Signed)
Progress Note   Patient: Stacey Chambers YCX:[id number] DOB: October 24, 1950 DOA: 12/01/2021     14 DOS: the patient was seen and examined on 12/15/2021   Brief hospital course: PMHx chronic iron deficiency anemia, diabetes mellitus type 2, hypertension, hyperlipidemia, hypothyroidism, history of CKD stage IIIb as well as other comorbidities who presents with worsening lower extremity swelling and elevated blood pressure.  Patient states that she started feeling fatigued and tired since Friday of last week is noticed that her legs started having worsening swelling.  She states that whenever she ambulates she gets very dyspneic on exertion and very fatigued.  States that she denies any abdominal pain and denies any cough, chest pain or lightheadedness or dizziness.  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  She had lower extremity swelling and given her dyspnea on exertion we will obtain an echocardiogram and evaluate for heart failure.  She is also noted to have AKI on CKD stage IIIb.  Incidentally she was found to be COVID-positive.  Gastroenterology was consulted and [facility] was asked to admit this patient for lower extremity swelling.  Interventional radiology was consulted for a paracentesis and this has been done and she underwent a diagnostic tap.  Assessment and Plan: No notes have been filed under this hospital service. Service: Hospitalist New onset Decompensated liver cirrhosis with portal HTN/mild splenomegaly/moderate ascites  -Acute hepatitis panel negative -Diagnostic paracentesis: Aspirated 15ml sent for pathology: Not consistent with SBP -SAAG > 1.1 consistent with portal hypertension/cirrhosis. - 6/2 Echocardiogram EF of 60-65% and G2DD. -No focal lesion on unenhanced CT scan.  -AFP WNL -.HBsAg, HCV ab, ANA, AMA and A-1  antitrypsin Negative.  -No evidence for hemochromatosis. Ceruloplasmin low at 16.4 -ASMA elevated at 52.  -Negative ANA. SM IgG neg -Urine copper 57 -2gm low sodium diet  -6/12 results of liver biopsy cirrhosis of uncertain etiology  - Per GI, no indication for steroids. Will need referral to liver transplant center ultimately   Dyspnea on Exertion r/o CHF (Unclear type) -Resolved, on minimal O2 support   Acute Diastolic CHF -On admission BNP on admission 715 - Fluid management now per HD once initiated    Hypertensive Urgency -BP stable at this time -Cont norvasc 10mg , 25mg  Q8hrs hydralazine   COVID-19 Disease -Currently asymptomatic   COVID-19 Labs -No longer on isolation -Remains asymptomatic   Elevated D-dimer - BLE dopplers neg   Hx of Gastric Ulcer/GERD/GI Prophylaxis -C/w PPI BID   Diabetes Mellitus Type 2 -Hold Home Glipizide -6/2 HbA1c=4.9 -Place on Moderate Novolog SSI AC/HS  CBG (last 3)     Hypothyroidism -Most recent TSH 2.793 -C/w Levothyroxine 25 mcg po Daily   Acute on CKD stage IIIb/Nephrotic syndrome -Nephrology had been following -Noted to have gradual progression over past year suspected to be from uncontrolled HTN -Now with frank progression this hospitalization with Cr is worsening and early uremic signs -discussed with Nephrology with recs for transfer to [facility] for initiation for HD. IR consulted for placement of TDC -cont to follow BMET trends   Metabolic Acidosis -cont with dialysis   Hyperphosphatemia   Hyponatremia  -Normalized   Normocytic Anemia -hgb stable -Hemodynamically stable    Hypoalbuminemia -Stable   Thrombocytopenia -remains stable -repeat CBC in AM    Goals of care - 6/7 palliative care consult: Multisystem organ failure evaluate for patient CODE STATUS to DNR, given  her serious multiple comorbidities patient requires HCPOA       Subjective: Denies sob or chest pains. Does report some nose bleeding  overnight that had stopped  Physical Exam: Vitals:   12/15/21 0455 12/15/21 1036 12/15/21 1345 12/15/21 1413  BP:  124/62 124/66 115/61  Pulse:  78 70 72  Resp:    17  Temp:    97.8 F (36.6 C)  TempSrc:    Oral  SpO2:    99%  Weight: 64.4 kg     Height:       General exam: Conversant, in no acute distress Respiratory system: normal chest rise, clear, no audible wheezing Cardiovascular system: regular rhythm, s1-s2 Gastrointestinal system: Nondistended, nontender, pos BS Central nervous system: No seizures, no tremors Extremities: No cyanosis, no joint deformities Skin: No rashes, no pallor Psychiatry: Affect normal // no auditory hallucinations   Data Reviewed:  Labs reviewed: Cr 4.78, Hgb 8.1   Family Communication: Pt in room, family not at bedside  Disposition: Status is: Inpatient Remains inpatient appropriate because: Severity of illness  Planned Discharge Destination: Home   Author: Marylu Lund, MD 12/15/2021 4:10 PM  For on call review [email or web address].

## 2021-12-15 NOTE — Plan of Care (Signed)

## 2021-12-15 NOTE — Progress Notes (Signed)
[facility] Progress Note  Subjective: pt w/o new c/o's. Creat up 4.7 today.   Vitals:   12/15/21 0455 12/15/21 1036 12/15/21 1345 12/15/21 1413  BP:  124/62 124/66 115/61  Pulse:  78 70 72  Resp:    17  Temp:    97.8 F (36.6 C)  TempSrc:    Oral  SpO2:    99%  Weight: 64.4 kg     Height:        Exam: JXB:[id number] resting in bed YQM:VHQIO regular rhythm and normal rate, normal s1 and s2 Resp: Clear to auscultation bilaterally, no rales/rhonchi. NGE:XBMW, slightly distended, non-tender, bowel sounds normal Ext: No lower extremity edema  Home meds include - norvasc 2.5, esomeprazole, ferous gluconate, fluticasone, levothyroxine, meteprolol succinate xl 50 qd, ergocalciferol       Date                         Creat               eGFR    July 2022                  1.41                 41 ml/min     Sept 2022                 1.69                 31 ml/min    Oct 2022                  1.85                     Feb 2023                  2.17                 23 ml/min    12/01/21                     2.93    12/02/21                     3.21                 15      UA - >50 rbcs, 0-5 wbcs, no bacteria, 0-5 epis, clear, yellow, mod Hb, > 300 prot     CXR - 6/01 > IMPRESSION: Central pulmonary vessels are prominent, possibly suggesting mild CHF. There is no focal pulmonary consolidation. Small transverse linear density in the left lower lung fields may suggest scarring or subsegmental atelectasis   UP/C ratio = 6.8 gm    UNa 48,  UCr 86    Renal US - 7.2 and 7.0 kidneys w/o hydro, + echotexture bilat    Urine sediment (6/03, undersigned) - showed numerous dysmorphic RBC's, one rbc cast and rare granular casts, mild pyuria.   Assessment/ Plan: Acute kidney Injury on CKD Stage IV: Baseline creatinine presumed to be about 2.1 with gradual progression over the past year suspected to be from hypertension given preceding history and presentation with hypertensive urgency.   Less likely that she has type II HRS based on presentation and lack of response to earlier therapies.  The presence of RBCs with likely RBC fracture cast seen by Dr. Jonnie Finner on analysis of  the urine sediment raised concern for acute GN, however serologies including antinuclear antibody and ANCA were negative.  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  We recommended biopsy for diagnosis and prognosis - biopsy was done 6/13 by IR. Creat worse now in the high 4s. Pt has early uremic signs and +fetor. Recommend proceed with dialysis. Concerned this may be ESRD.  Biopsy results are pending. Plan transfer [facility] then HD. IR is aware.  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   HTN urgency: blood pressures better controlled w/ addition of hydralazine to amlodipine and metoprolol xl.  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   [name], MD 12/14/2021, 9:49 AM  Recent Labs  Lab 12/12/21 0502 12/13/21 0509 12/14/21 0605 12/15/21 0559  HGB 8.0* 8.2* 8.2* 8.1*  ALBUMIN 2.8* 2.8*  --   --   CALCIUM 8.6* 8.3* 8.7* 8.3*  PHOS 3.8 4.2  --   --   CREATININE 4.76* 4.77* 4.69* 4.78*  K 4.4 4.4 4.5 4.8    Inpatient medications:  amLODipine  10 mg Oral Daily   vitamin C  500 mg Oral Daily   docusate sodium  100 mg Oral BID   folic acid  1 mg Oral Daily   heparin injection (subcutaneous)  5,000 Units Subcutaneous Q8H   hydrALAZINE  25 mg Oral Q8H   insulin aspart  0-15 Units Subcutaneous TID WC   insulin aspart  0-5 Units Subcutaneous QHS   levothyroxine  25 mcg Oral Daily   melatonin  3 mg Oral Once   metoprolol succinate  50 mg Oral Daily    multivitamin with minerals  1 tablet Oral Daily   pantoprazole  40 mg Oral BID   thiamine  100 mg Oral Daily   zinc sulfate  220 mg Oral Daily    acetaminophen **OR** acetaminophen, chlorpheniramine-HYDROcodone, fluticasone, guaiFENesin-dextromethorphan, hydrALAZINE, Ipratropium-Albuterol, melatonin, ondansetron **OR** ondansetron (ZOFRAN) IV, oxyCODONE, polyethylene glycol, traZODone

## 2021-12-16 ENCOUNTER — Inpatient Hospital Stay (HOSPITAL_COMMUNITY): Payer: Medicare Other

## 2021-12-16 ENCOUNTER — Encounter (HOSPITAL_COMMUNITY): Payer: Self-pay | Admitting: Internal Medicine

## 2021-12-16 DIAGNOSIS — K7031 Alcoholic cirrhosis of liver with ascites: Secondary | ICD-10-CM | POA: Diagnosis not present

## 2021-12-16 HISTORY — PX: IR FLUORO GUIDE CV LINE RIGHT: IMG2283

## 2021-12-16 HISTORY — PX: IR US GUIDE VASC ACCESS RIGHT: IMG2390

## 2021-12-16 LAB — CBC WITH DIFFERENTIAL/PLATELET
Abs Immature Granulocytes: 0.01 10*3/uL (ref 0.00–0.07)
Basophils Absolute: 0.1 10*3/uL (ref 0.0–0.1)
Basophils Relative: 1 %
Eosinophils Absolute: 0.1 10*3/uL (ref 0.0–0.5)
Eosinophils Relative: 3 %
HCT: 22.8 % — ABNORMAL LOW (ref 36.0–46.0)
Hemoglobin: 7.4 g/dL — ABNORMAL LOW (ref 12.0–15.0)
Immature Granulocytes: 0 %
Lymphocytes Relative: 19 %
Lymphs Abs: 0.9 10*3/uL (ref 0.7–4.0)
MCH: 30.7 pg (ref 26.0–34.0)
MCHC: 32.5 g/dL (ref 30.0–36.0)
MCV: 94.6 fL (ref 80.0–100.0)
Monocytes Absolute: 0.5 10*3/uL (ref 0.1–1.0)
Monocytes Relative: 11 %
Neutro Abs: 3 10*3/uL (ref 1.7–7.7)
Neutrophils Relative %: 66 %
Platelets: 98 10*3/uL — ABNORMAL LOW (ref 150–400)
RBC: 2.41 MIL/uL — ABNORMAL LOW (ref 3.87–5.11)
RDW: 13.6 % (ref 11.5–15.5)
WBC: 4.6 10*3/uL (ref 4.0–10.5)
nRBC: 0 % (ref 0.0–0.2)

## 2021-12-16 LAB — BASIC METABOLIC PANEL
Anion gap: 11 (ref 5–15)
BUN: 59 mg/dL — ABNORMAL HIGH (ref 8–23)
CO2: 19 mmol/L — ABNORMAL LOW (ref 22–32)
Calcium: 8.5 mg/dL — ABNORMAL LOW (ref 8.9–10.3)
Chloride: 106 mmol/L (ref 98–111)
Creatinine, Ser: 5.14 mg/dL — ABNORMAL HIGH (ref 0.44–1.00)
GFR, Estimated: 9 mL/min — ABNORMAL LOW (ref >60)
Glucose, Bld: 97 mg/dL (ref 70–99)
Potassium: 5 mmol/L (ref 3.5–5.1)
Sodium: 136 mmol/L (ref 135–145)

## 2021-12-16 LAB — AMMONIA: Ammonia: 108 umol/L — ABNORMAL HIGH (ref 9–35)

## 2021-12-16 LAB — GLUCOSE, CAPILLARY
Glucose-Capillary: 103 mg/dL — ABNORMAL HIGH (ref 70–99)
Glucose-Capillary: 110 mg/dL — ABNORMAL HIGH (ref 70–99)
Glucose-Capillary: 91 mg/dL (ref 70–99)
Glucose-Capillary: 158 mg/dL — ABNORMAL HIGH (ref 70–99)

## 2021-12-16 LAB — HEPATITIS B CORE ANTIBODY, TOTAL: Hep B Core Total Ab: NONREACTIVE

## 2021-12-16 LAB — HEPATITIS B SURFACE ANTIBODY,QUALITATIVE: Hep B S Ab: NONREACTIVE

## 2021-12-16 LAB — HEPATITIS B SURFACE ANTIGEN: Hepatitis B Surface Ag: NONREACTIVE

## 2021-12-16 LAB — HEPATITIS C ANTIBODY: HCV Ab: NONREACTIVE

## 2021-12-16 MED ORDER — HEPARIN SODIUM (PORCINE) 1000 UNIT/ML IJ SOLN
INTRAMUSCULAR | Status: AC
  Filled 2021-12-16: qty 4

## 2021-12-16 MED ORDER — LACTULOSE 10 GM/15ML PO SOLN
30.0000 g | Freq: Three times a day (TID) | ORAL | Status: DC
  Administered 2021-12-16 – 2021-12-18 (×4): 30 g via ORAL
  Filled 2021-12-16 (×5): qty 45

## 2021-12-16 MED ORDER — LIDOCAINE HCL 1 % IJ SOLN
INTRAMUSCULAR | Status: AC
  Filled 2021-12-16: qty 20

## 2021-12-16 MED ORDER — MIDAZOLAM HCL 2 MG/2ML IJ SOLN
INTRAMUSCULAR | Status: AC | PRN
  Administered 2021-12-16: 1 mg via INTRAVENOUS

## 2021-12-16 MED ORDER — HEPARIN SODIUM (PORCINE) 1000 UNIT/ML IJ SOLN
INTRAMUSCULAR | Status: AC | PRN
  Administered 2021-12-16: 500 [IU] via INTRAVENOUS

## 2021-12-16 MED ORDER — HEPARIN SODIUM (PORCINE) 1000 UNIT/ML IJ SOLN
INTRAMUSCULAR | Status: AC
  Administered 2021-12-16: 3200 [IU]
  Filled 2021-12-16: qty 10

## 2021-12-16 MED ORDER — CHLORHEXIDINE GLUCONATE CLOTH 2 % EX PADS
6.0000 | MEDICATED_PAD | Freq: Every day | CUTANEOUS | Status: DC
  Administered 2021-12-17 – 2021-12-20 (×4): 6 via TOPICAL

## 2021-12-16 MED ORDER — MIDAZOLAM HCL 2 MG/2ML IJ SOLN
INTRAMUSCULAR | Status: AC
  Filled 2021-12-16: qty 2

## 2021-12-16 NOTE — Progress Notes (Addendum)
PROGRESS NOTE  Stacey Chambers  CLE:[id number] DOB: [birth year] DOA: 12/01/2021 PCP: Trey Sailors, PA   Brief Narrative:  Patient is a 71 year old female with history of chronic iron deficiency anemia, diabetes type 2, hypertension, hyperlipidemia, hypothyroidism, CKD stage IIIb, alcohol abuse who presented initially with lower extremity swelling, elevated blood pressure, fatigue ,dyspneic on ambulation.  After admission, she was found to have new onset liver cirrhosis with portal hypertension, splenomegaly, moderate volume ascites.  Also noted to have acute kidney injury on CKD.  COVID screen test was also positive.  GI was consulted.she underwent liver biopsy and renal biopsy during this hospitalization.  Nephrology also consulted for worsening kidney function and she was transferred to [facility] to be started on dialysis.  Underwent tunneled hemodialysis catheter placement by IR on 6/16  Assessment & Plan:  Principal Problem:   Cirrhosis ([place]) Active Problems:   History of gastric ulcer   History of thrombocytopenia   History of iron deficiency anemia   Gastric ulcer   Dyspnea on exertion   Swelling of lower extremity   Acute kidney injury superimposed on CKD ([place])   Diabetes mellitus type 2 with complications (HCC)   Acute diastolic CHF (congestive heart failure) ([place])   2019 novel coronavirus disease (COVID-19)   Hypertensive urgency   Diabetes mellitus type 2, controlled, without complications ([place])   Acute renal failure superimposed on stage 3b chronic kidney disease (HCC)   Nephrotic syndrome   Normocytic anemia   Low serum albumin   Thrombocytopenia (HCC)   Liver disease, chronic, with cirrhosis ([place])   New onset decompensated liver cirrhosis/portal hypertension/splenomegaly/ascites: Acute hepatitis panel negative.  Diagnostic paracentesis was negative for SBP, fluid was transudative in nature.  Echo showed EF of 60 to 94%, grade 2 diastolic dysfunction.  No  focal lesion on CT scan.  Alpha-fetoprotein normal, negative ANA, AMA, alpha antitrypsin antibody, no evidence of hemochromatosis.  Biopsy showed cirrhosis of uncertain etiology.  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  No indication of steroids.  She needs referral to outpatient liver transplant center /hepatology in the future.  Elevated ammonia level: Significantly elevated in the range of 100 today.  But patient is completely alert and oriented.  Started on lactulose.  Titrate for 3-4 loose bowel movements a day ,check ammonia level tomorrow.  AKI on CKD stage IIIb: Progressive worsening the kidney function.  Baseline creatinine around 2.1 with gradual progression suspected to be from hypertension.  Serology negative for antinuclear antibody, ANCA.  Not consistent with ATN/COVID  AKI.  Biopsy was done by IR on 6/13.  Creatinine gradually worsened, developing uremic signs.  Nephrology recommended dialysis.  Transferred to [facility].  IR consulted and she underwent tunneled hemodialysis catheter placement 6/16  Hypertensive urgency: Continue current medications.  Monitor blood pressure.  Currently on hydralazine, amlodipine, metoprolol  COVID illness: Screen test was positive.  No significant hypoxia.  G.Got molnupiravir.  Continue vitamin C, zinc.  Off isolation  History of gastric ulcer/GERD: Continue PPI  Diabetes type 2: A1c of 4.9.  Takes glipizide at home.  Currently on sliding scale  Hypothyroidism: Continue Synthyroid  Normocytic anemia: Likely associated with CKD.  Currently hemoglobin stable  Thrombocytopenia: Most likely secondary to  cirrhosis.  Continue to monitor, stable  Goals of care: Multiple comorbidities with organ dysfunction/failure.  Palliative care following.  Remains full code. Patient lives alone, no frequent contact with family.          DVT prophylaxis:heparin injection 5,000 Units Start: 12/13/21 2200 SCDs Start:  12/01/21 1826     Code Status: Full  Code  Family Communication: None at the bedside  Patient status:Inpatient  Patient is from :Home  Anticipated discharge MW:UXLK  Estimated DC date:Not sure   Consultants: GI, nephrology, palliative care  Procedures: Kidney biopsy, liver biopsy, paracentesis  Antimicrobials:  Anti-infectives (From admission, onward)    Start     Dose/Rate Route Frequency Ordered Stop   12/01/21 1900  molnupiravir EUA (LAGEVRIO) capsule 800 mg        4 capsule Oral 2 times daily 12/01/21 1546 12/06/21 0904       Subjective: Patient seen and examined at the bedside this morning.  Hemodynamically stable.  Comfortable.  Sitting at the edge of the bed.  [name] interpreter was used during communication.  She looks comfortable, no new complaints.  No significant edema noted on the legs.  Lungs are clear on auscultation.  Objective: Vitals:   12/15/21 2127 12/16/21 0129 12/16/21 0509 12/16/21 0510  BP: 122/65 (!) 111/54 (!) 107/55   Pulse: 68 68 67   Resp: 18 18 18    Temp: 97.8 F (36.6 C) 97.9 F (36.6 C) 97.9 F (36.6 C)   TempSrc: Oral Oral    SpO2: 98% 100% 99%   Weight:    62.5 kg  Height:        Intake/Output Summary (Last 24 hours) at 12/16/2021 0748 Last data filed at 12/16/2021 0143 Gross per 24 hour  Intake 440 ml  Output 25 ml  Net 415 ml   Filed Weights   12/15/21 0455 12/15/21 1742 12/16/21 0510  Weight: 64.4 kg 63.7 kg 62.5 kg    Examination:  General exam: Overall comfortable, not in distress, pleasant female HEENT: PERRL Respiratory system:  no wheezes or crackles  Cardiovascular system: S1 & S2 heard, RRR.  Gastrointestinal system: Abdomen is distended, soft and nontender. Central nervous system: Alert and oriented Extremities: No edema, no clubbing ,no cyanosis Skin: No rashes, no ulcers,no icterus     Data Reviewed: I have personally reviewed following labs and imaging studies  CBC: Recent Labs  Lab 12/12/21 0502 12/13/21 0509 12/14/21 0605  12/15/21 0559 12/16/21 0250  WBC 4.7 5.3 5.1 5.3 4.6  NEUTROABS 3.1 3.5 3.4 3.4 3.0  HGB 8.0* 8.2* 8.2* 8.1* 7.4*  HCT 25.2* 25.8* 25.4* 25.5* 22.8*  MCV 96.6 95.2 95.8 95.9 94.6  PLT 94* 105* 101* 102* 98*   Basic Metabolic Panel: Recent Labs  Lab 12/10/21 0623 12/11/21 0611 12/12/21 0502 12/13/21 0509 12/14/21 0605 12/15/21 0559  NA 140 139 138 136 137 135  K 3.9 4.2 4.4 4.4 4.5 4.8  CL 110 109 108 107 108 107  CO2 23 21* 22 20* 20* 20*  GLUCOSE 118* 132* 112* 97 119* 95  BUN 56* 61* 62* 62* 62* 61*  CREATININE 4.06* 4.71* 4.76* 4.77* 4.69* 4.78*  CALCIUM 8.3* 8.3* 8.6* 8.3* 8.7* 8.3*  MG 2.2 2.1 2.1 2.1  --   --   PHOS 4.6 4.1 3.8 4.2  --   --      No results found for this or any previous visit (from the past 240 hour(s)).   Radiology Studies: No results found.  Scheduled Meds:  amLODipine  10 mg Oral Daily   vitamin C  500 mg Oral Daily   Chlorhexidine Gluconate Cloth  6 each Topical Q0600   docusate sodium  100 mg Oral BID   folic acid  1 mg Oral Daily   heparin injection (subcutaneous)  5,000 Units Subcutaneous Q8H   hydrALAZINE  25 mg Oral Q8H   insulin aspart  0-15 Units Subcutaneous TID WC   insulin aspart  0-5 Units Subcutaneous QHS   levothyroxine  25 mcg Oral Daily   melatonin  3 mg Oral Once   metoprolol succinate  50 mg Oral Daily   multivitamin with minerals  1 tablet Oral Daily   pantoprazole  40 mg Oral BID   thiamine  100 mg Oral Daily   zinc sulfate  220 mg Oral Daily   Continuous Infusions:   LOS: 15 days   Shelly Coss, MD [facility] P6/16/2023, 7:48 AM

## 2021-12-16 NOTE — Procedures (Signed)
Vascular and Interventional Radiology Procedure Note  Patient: Stacey Chambers DOB: 04-Nov-1950 Medical Record Number: [id number] Note Date/Time: 12/16/21 10:33 AM   Performing Physician: Michaelle Birks, MD Assistant(s): None  Diagnosis: ESRD requiring Hemodialysis  Procedure: TUNNELED HEMODIALYSIS CATHETER PLACEMENT  Anesthesia: Conscious Sedation Complications: None Estimated Blood Loss: Minimal Specimens:  None  Findings:  Successful placement of right-sided, 19 cm (tip-to-cuff), tunneled hemodialysis catheter with the tip of the catheter in the proximal right atrium.  Plan: Catheter ready for use.  See detailed procedure note with images in PACS. The patient tolerated the procedure well without incident or complication and was returned to Floor Bed in stable condition.    Michaelle Birks, MD Vascular [facility] Interventional Radiology [facility] Radiology   Pager. [place]

## 2021-12-16 NOTE — Consult Note (Signed)
Chief Complaint: Patient was seen in consultation today for acute on CKD stage IIIb/nephrotic syndrome at the request of nephrology  Referring Physician(s): Roney Jaffe, MD  Supervising Physician: Jacqulynn Cadet  Patient Status: [facility] - In-pt  History of Present Illness: Stacey Chambers is a 71 y.o. female PMHx chronic iron deficiency anemia, diabetes mellitus type 2, hypertension, hyperlipidemia, hypothyroidism, history of CKD stage IIIb as well as other comorbidities who presents with worsening lower extremity swelling and elevated blood pressure.   Patient states that she started feeling fatigued and tired since Friday of last week and noticed that her legs started having worsening swelling.  She states that whenever she ambulates she gets very dyspneic on exertion and very fatigued. Denies any abdominal pain and denies any cough, chest pain or lightheadedness or dizziness.   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 She is also noted to have AKI on CKD stage IIIb.  Incidentally she was found to be COVID-positive.   IR was previously consulted for paracentesis as well as liver and renal biopsies.  Now,  IR has  been asked to place a tunneled dialysis catheter for hemodialysis.  Past Medical History:  Diagnosis Date   Anemia    Diabetes mellitus without complication ([place])    Hypertension    Vision loss     Past Surgical History:  Procedure Laterality Date   BACK SURGERY     COLONOSCOPY     UPPER GASTROINTESTINAL ENDOSCOPY      Allergies: Patient has no known allergies.  Medications: Prior to Admission medications   Medication Sig Start Date End Date Taking? Authorizing Provider  amLODipine (NORVASC) 2.5 MG tablet Take 2.5 mg by mouth daily. 11/25/21  Yes [provider]  atorvastatin (LIPITOR) 20 MG tablet Take 20 mg by mouth daily.  11/25/21  Yes [provider]  esomeprazole (NEXIUM) 40 MG capsule Take 1 capsule (40 mg total) by mouth 2 (two) times daily. 09/13/21  Yes Mansouraty, Telford Nab., MD  ferrous gluconate (FERGON) 324 MG tablet Take 1 tablet (324 mg total) by mouth daily with breakfast. 09/13/21  Yes Mansouraty, Telford Nab., MD  fluticasone [facility]) 50 MCG/ACT nasal spray Place 1 spray into both nostrils daily as needed for allergies. 10/20/21  Yes [provider]  glipiZIDE (GLUCOTROL) 5 MG tablet Take 2.5 mg by mouth daily. 03/10/21  Yes [provider]  levothyroxine (SYNTHROID) 25 MCG tablet Take 25 mcg by mouth daily. 11/17/21  Yes [provider]  metoprolol succinate (TOPROL-XL) 50 MG 24 hr tablet Take 50 mg by mouth daily. 10/20/21  Yes [provider]  Vitamin D, Ergocalciferol, (DRISDOL) 1.25 MG (50000 UNIT) CAPS capsule Take 50,000 Units by mouth once a week. Wednesday 10/25/21  Yes [provider]  ACCU-CHEK GUIDE test strip  02/01/21   [provider]  Accu-Chek Softclix Lancets lancets SMARTSIG:Topical 01/31/21   [provider]     Family History  Problem Relation Age of Onset   Diabetes Brother    Colon cancer Neg Hx    Esophageal cancer Neg Hx    Stomach cancer Neg Hx    Pancreatic cancer Neg Hx    Inflammatory bowel disease Neg Hx    Liver disease Neg Hx    Rectal cancer Neg Hx    Colon polyps Neg Hx     Social History   Socioeconomic History   Marital status: Married  Spouse name: Not on file   Number of children: Not on file   Years of education: Not on file   Highest education level: Not on file  Occupational History   Not on file  Tobacco Use   Smoking status: Never   Smokeless tobacco: Never  Vaping Use   Vaping Use: Never used  Substance and Sexual Activity   Alcohol use: Never    Comment: once in awhile   Drug use: Not Currently    Types: Marijuana    Comment: Use of THC tea 1-2 times per week   Sexual  activity: Not on file  Other Topics Concern   Not on file  Social History Narrative   Not on file   Social Determinants of Health   Financial Resource Strain: Not on file  Food Insecurity: Not on file  Transportation Needs: Not on file  Physical Activity: Not on file  Stress: Not on file  Social Connections: Not on file    Review of Systems: A 12 point ROS discussed and pertinent positives are indicated in the HPI above.  All other systems are negative.  Review of Systems  Constitutional: Negative.   HENT: Negative.    Eyes: Negative.   Respiratory:  Negative for shortness of breath.   Cardiovascular:  Positive for leg swelling.  Endocrine: Negative.   Genitourinary: Negative.   Musculoskeletal: Negative.   Allergic/Immunologic: Negative.   Neurological: Negative.   Hematological: Negative.   Psychiatric/Behavioral: Negative.      Vital Signs: BP (!) 121/57 (BP Location: Left Arm)   Pulse 70   Temp 98 F (36.7 C)   Resp 18   Ht 4\' 10"  (1.473 m)   Wt 137 lb 12.8 oz (62.5 kg)   SpO2 99%   BMI 28.80 kg/m   Physical Exam Vitals reviewed.  Constitutional:      Appearance: Normal appearance.  HENT:     Head: Normocephalic and atraumatic.     Mouth/Throat:     Mouth: Mucous membranes are moist.     Pharynx: Oropharynx is clear.  Eyes:     Extraocular Movements: Extraocular movements intact.  Cardiovascular:     Rate and Rhythm: Normal rate and regular rhythm.     Pulses: Normal pulses.  Pulmonary:     Effort: Pulmonary effort is normal. No respiratory distress.  Abdominal:     Palpations: Abdomen is soft.  Skin:    General: Skin is warm and dry.  Neurological:     General: No focal deficit present.     Mental Status: She is alert.  Psychiatric:        Mood and Affect: Mood normal.        Behavior: Behavior normal.     Imaging: US BIOPSY (KIDNEY)  Result Date: 12/13/2021 INDICATION: Acute kidney injury, chronic kidney disease and need for renal  biopsy. EXAM: ULTRASOUND GUIDED CORE BIOPSY OF LEFT KIDNEY MEDICATIONS: None. ANESTHESIA/SEDATION: Moderate (conscious) sedation was employed during this procedure. A total of Versed 2.0 mg and Fentanyl 100 mcg was administered intravenously by radiology nursing. Moderate Sedation Time: 15 minutes. The patient's level of consciousness and vital signs were monitored continuously by radiology nursing throughout the procedure under my direct supervision. PROCEDURE: The procedure, risks, benefits, and alternatives were explained to the patient. Questions regarding the procedure were encouraged and answered. The patient understands and consents to the procedure. Both kidneys were imaged by ultrasound in a prone position. The left flank region was prepped with chlorhexidine in  a sterile fashion, and a sterile drape was applied covering the operative field. A sterile gown and sterile gloves were used for the procedure. Local anesthesia was provided with 1% Lidocaine. The left kidney was chosen for biopsy. A 15 gauge trocar needle was advanced to the margin posterior renal cortex at roughly the level of the mid kidney. Two separate coaxial 16 gauge core biopsy samples were obtained and submitted in saline. A slurry of Gel-Foam pledgets mixed in sterile saline was then injected via the outer needle as the needle was retracted and removed. COMPLICATIONS: None immediate. FINDINGS: Both kidneys are small and echogenic. The left renal cortex was slightly thicker and better visualized posteriorly and therefore the left kidney was chosen for biopsy. Solid tissue was obtained. IMPRESSION: Ultrasound-guided core biopsy performed at the level of left renal cortex. Electronically Signed   By: Aletta Edouard M.D.   On: 12/13/2021 16:34   VAS [place] LOWER EXTREMITY VENOUS (DVT)  Result Date: 12/11/2021  Lower Venous DVT Study Patient Name:  [facility]  Date of Exam:   12/10/2021 Medical Rec #: [id number]              Accession  #:    [id number] Date of Birth: 10-20-1950             Patient Gender: F Patient Age:   74 years Exam Location:  [facility] Procedure:      VAS [place] LOWER EXTREMITY VENOUS (DVT) Referring Phys: Vicente Serene WOODS --------------------------------------------------------------------------------  Indications: Elevated D-dimer in COVID+ patient.  Comparison Study: No previous exams Performing Technologist: Jody Hill RVT, RDMS  Examination Guidelines: A complete evaluation includes B-mode imaging, spectral Doppler, color Doppler, and power Doppler as needed of all accessible portions of each vessel. Bilateral testing is considered an integral part of a complete examination. Limited examinations for reoccurring indications may be performed as noted. The reflux portion of the exam is performed with the patient in reverse Trendelenburg.  +---------+---------------+---------+-----------+----------+--------------+ RIGHT    CompressibilityPhasicitySpontaneityPropertiesThrombus Aging +---------+---------------+---------+-----------+----------+--------------+ CFV      Full           Yes      Yes                                 +---------+---------------+---------+-----------+----------+--------------+ SFJ      Full                                                        +---------+---------------+---------+-----------+----------+--------------+ FV Prox  Full           Yes      Yes                                 +---------+---------------+---------+-----------+----------+--------------+ FV Mid   Full           Yes      Yes                                 +---------+---------------+---------+-----------+----------+--------------+ FV DistalFull           Yes      Yes                                 +---------+---------------+---------+-----------+----------+--------------+  PFV      Full                                                         +---------+---------------+---------+-----------+----------+--------------+ POP      Full           Yes      Yes                                 +---------+---------------+---------+-----------+----------+--------------+ PTV      Full                                                        +---------+---------------+---------+-----------+----------+--------------+ PERO     Full                                                        +---------+---------------+---------+-----------+----------+--------------+   +---------+---------------+---------+-----------+----------+--------------+ LEFT     CompressibilityPhasicitySpontaneityPropertiesThrombus Aging +---------+---------------+---------+-----------+----------+--------------+ CFV      Full           Yes      Yes                                 +---------+---------------+---------+-----------+----------+--------------+ SFJ      Full                                                        +---------+---------------+---------+-----------+----------+--------------+ FV Prox  Full           Yes      Yes                                 +---------+---------------+---------+-----------+----------+--------------+ FV Mid   Full           Yes      Yes                                 +---------+---------------+---------+-----------+----------+--------------+ FV DistalFull           Yes      Yes                                 +---------+---------------+---------+-----------+----------+--------------+ PFV      Full                                                        +---------+---------------+---------+-----------+----------+--------------+  POP      Full           Yes      Yes                                 +---------+---------------+---------+-----------+----------+--------------+ PTV      Full                                                         +---------+---------------+---------+-----------+----------+--------------+ PERO     Full                                                        +---------+---------------+---------+-----------+----------+--------------+     Summary: BILATERAL: - No evidence of deep vein thrombosis seen in the lower extremities, bilaterally. -No evidence of popliteal cyst, bilaterally. Pulsatile doppler waveforms, bilaterally.   *See table(s) above for measurements and observations. Electronically signed by Servando Snare MD on 12/11/2021 at 10:07:41 AM.    Final    US BIOPSY (LIVER)  Result Date: 12/09/2021 INDICATION: 71 year old female referred for medical liver biopsy, cirrhosis EXAM: ULTRASOUND-GUIDED MEDICAL LIVER BIOPSY MEDICATIONS: None. ANESTHESIA/SEDATION: Moderate (conscious) sedation was employed during this procedure. A total of Versed 2.0 mg and Fentanyl 100 mcg was administered intravenously by the radiology nurse. Total intra-service moderate Sedation Time: 14 minutes. The patient's level of consciousness and vital signs were monitored continuously by radiology nursing throughout the procedure under my direct supervision. COMPLICATIONS: None PROCEDURE: Informed written consent was obtained from the patient via [name] interpretation after a thorough discussion of the procedural risks, benefits and alternatives. All questions were addressed. Maximal Sterile Barrier Technique was utilized including caps, mask, sterile gowns, sterile gloves, sterile drape, hand hygiene and skin antiseptic. A timeout was performed prior to the initiation of the procedure. Ultrasound survey of the right liver lobe performed with images stored and sent to PACs. The subxiphoid region was prepped with chlorhexidine in a sterile fashion, and a sterile drape was applied covering the operative field. A sterile gown and sterile gloves were used for the procedure. Local anesthesia was provided with 1% Lidocaine. The patient was prepped and  draped sterilely and the skin and subcutaneous tissues were generously infiltrated with 1% lidocaine. A 17 gauge introducer needle was then advanced under ultrasound guidance into the left liver via subxiphoid location, avoiding the ascites. The stylet was removed, and multiple separate 18 gauge core biopsy were retrieved. Samples were placed into formalin for transportation to the lab. Gel-Foam pledgets were then infused with a small amount of saline for assistance with hemostasis. The needle was removed, and a final ultrasound image was performed. The patient tolerated the procedure well and remained hemodynamically stable throughout. No complications were encountered and no significant blood loss was encounter. IMPRESSION: Status post medical liver biopsy, left liver lobe. Signed, Dulcy Fanny. Dellia Nims, RPVI [facility] Electronically Signed   By: Corrie Mckusick D.O.   On: 12/09/2021 17:22   DG CHEST PORT 1 VIEW  Result Date: 12/07/2021 CLINICAL DATA:  Shortness of breath.  Coronavirus infection. EXAM: PORTABLE  CHEST 1 VIEW COMPARISON:  12/05/2021.  12/04/2021.  12/01/2021. FINDINGS: Apparent enlargement of bilateral pleural effusions, more on the right than the left. Abnormal density the lung that could be due to a combination of edema, atelectasis and pneumonia, worse on the right than the left. IMPRESSION: Most consistent with fluid overload/congestive heart failure. Enlarging effusions right more than left. Mild interstitial edema. Atelectasis related to the effusions. Coexistent pneumonia not excluded. Electronically Signed   By: Nelson Chimes M.D.   On: 12/07/2021 07:40   DG CHEST PORT 1 VIEW  Result Date: 12/05/2021 CLINICAL DATA:  Hypertension and leg edema. EXAM: PORTABLE CHEST 1 VIEW COMPARISON:  December 04, 2021 FINDINGS: The heart size and mediastinal contours are within normal limits. Mild increased pulmonary interstitium is identified  bilaterally. Minimal right pleural effusion is noted. Mild patchy consolidation of right lung base is noted. The visualized skeletal structures are stable. IMPRESSION: 1. Mild interstitial edema. 2. Mild patchy consolidation of right lung base, superimposed pneumonia is not excluded. Electronically Signed   By: Abelardo Diesel M.D.   On: 12/05/2021 07:26   DG CHEST PORT 1 VIEW  Result Date: 12/04/2021 CLINICAL DATA:  Bilateral lower extremity edema.  Hypertension. EXAM: PORTABLE CHEST 1 VIEW COMPARISON:  12/01/2021 FINDINGS: Heart size remains within normal limits. New symmetric airspace disease is seen with bibasilar predominance, most likely due to pulmonary edema. Small layering bilateral pleural effusions are also suspected. Lumbar spine fusion hardware also noted. IMPRESSION: New symmetric airspace disease, most likely due to pulmonary edema. Suspect small layering bilateral pleural effusions. Electronically Signed   By: Marlaine Hind M.D.   On: 12/04/2021 08:23   US RENAL  Result Date: 12/02/2021 CLINICAL DATA:  Acute renal insufficiency. EXAM: RENAL / URINARY TRACT ULTRASOUND COMPLETE COMPARISON:  Right upper quadrant ultrasound of 12/01/2021. CT 12/01/2021. FINDINGS: Right Kidney: Renal measurements: 7.2 x 4.6 x 3.4 cm = volume: 67 mL. Mildly increased renal echogenicity. Mild renal cortical thinning. No hydronephrosis. Left Kidney: Renal measurements: 7.0 x 4.2 x 5.6 cm = volume: 86 mL. Mildly increased renal echogenicity. Normal cortical thickness. No hydronephrosis. 2.5 cm lesion within the lower pole was characterized on the MRI of 06/29/2021 as a complex cyst. Bladder: Appears normal for degree of bladder distention. Other: Abdominal ascites and cirrhosis. IMPRESSION: 1. No hydronephrosis. 2. Increased renal echogenicity, suggesting medical renal disease. 3. Cirrhosis and ascites. Electronically Signed   By: Abigail Miyamoto M.D.   On: 12/02/2021 17:49   ECHOCARDIOGRAM COMPLETE  Result Date:  12/02/2021    ECHOCARDIOGRAM REPORT   Patient Name:   Stacey Chambers Date of Exam: 12/02/2021 Medical Rec #:  [id number]             Height:       58.0 in Accession #:    [id number]            Weight:       143.3 lb Date of Birth:  1950-08-20            BSA:          1.581 m Patient Age:    37 years              BP:           148/79 mmHg Patient Gender: F                     HR:           74 bpm. Exam Location:  Inpatient Procedure: 2D Echo, Cardiac Doppler and Color Doppler Indications:    Dyspnea  History:        Patient has no prior history of Echocardiogram examinations.                 Risk Factors:Diabetes and Hypertension.  Sonographer:    Joette Catching RCS Referring Phys: [id number] [facility] LATIF [facility]  Sonographer Comments: Technically challenging study due to limited acoustic windows. IMPRESSIONS  1. Left ventricular ejection fraction, by estimation, is 60 to 65%. The left ventricle has normal function. The left ventricle has no regional wall motion abnormalities. Left ventricular diastolic parameters are consistent with Grade II diastolic dysfunction (pseudonormalization).  2. Right ventricular systolic function is normal. The right ventricular size is normal. There is mildly elevated pulmonary artery systolic pressure.  3. Left atrial size was mildly dilated.  4. The mitral valve is normal in structure. No evidence of mitral valve regurgitation.  5. There is mild calcification of the aortic valve. Aortic valve regurgitation is trivial.  6. The inferior vena cava is normal in size with greater than 50% respiratory variability, suggesting right atrial pressure of 3 mmHg. Comparison(s): No prior Echocardiogram. FINDINGS  Left Ventricle: Left ventricular ejection fraction, by estimation, is 60 to 65%. The left ventricle has normal function. The left ventricle has no regional wall motion abnormalities. The left ventricular internal cavity size was normal in size. There is  no left ventricular hypertrophy.  Left ventricular diastolic parameters are consistent with Grade II diastolic dysfunction (pseudonormalization). Right Ventricle: The right ventricular size is normal. Right ventricular systolic function is normal. There is mildly elevated pulmonary artery systolic pressure. The tricuspid regurgitant velocity is 3.01 m/s, and with an assumed right atrial pressure of 3 mmHg, the estimated right ventricular systolic pressure is 17.7 mmHg. Left Atrium: Left atrial size was mildly dilated. Right Atrium: Right atrial size was normal in size. Pericardium: There is no evidence of pericardial effusion. Mitral Valve: The mitral valve is normal in structure. No evidence of mitral valve regurgitation. Tricuspid Valve: The tricuspid valve is not well visualized. Tricuspid valve regurgitation is mild. Aortic Valve: There is mild calcification of the aortic valve. Aortic valve regurgitation is trivial. Aortic valve mean gradient measures 9.0 mmHg. Aortic valve peak gradient measures 17.8 mmHg. Aortic valve area, by VTI measures 2.52 cm. Pulmonic Valve: The pulmonic valve was not well visualized. Pulmonic valve regurgitation is not visualized. Aorta: The aortic root and ascending aorta are structurally normal, with no evidence of dilitation. Venous: The inferior vena cava is normal in size with greater than 50% respiratory variability, suggesting right atrial pressure of 3 mmHg. IAS/Shunts: No atrial level shunt detected by color flow Doppler.  LEFT VENTRICLE PLAX 2D LVIDd:         4.70 cm   Diastology LVIDs:         3.10 cm   LV e' medial:    8.38 cm/s LV PW:         1.00 cm   LV E/e' medial:  13.6 LV IVS:        0.80 cm   LV e' lateral:   6.85 cm/s LVOT diam:     1.90 cm   LV E/e' lateral: 16.6 LV SV:         105 LV SV Index:   67 LVOT Area:     2.84 cm  RIGHT VENTRICLE             IVC RV Basal diam:  3.10 cm     IVC diam: 1.90 cm RV Mid diam:    1.90 cm RV S prime:     14.50 cm/s TAPSE (M-mode): 2.5 cm LEFT ATRIUM              Index        RIGHT ATRIUM           Index LA diam:        3.50 cm 2.21 cm/m   RA Area:     11.10 cm LA Vol (A2C):   66.0 ml 41.76 ml/m  RA Volume:   19.90 ml  12.59 ml/m LA Vol (A4C):   53.2 ml 33.66 ml/m LA Biplane Vol: 59.4 ml 37.58 ml/m  AORTIC VALVE                     PULMONIC VALVE AV Area (Vmax):    2.08 cm      PV Vmax:       1.21 m/s AV Area (Vmean):   2.09 cm      PV Peak grad:  5.9 mmHg AV Area (VTI):     2.52 cm AV Vmax:           211.00 cm/s AV Vmean:          145.000 cm/s AV VTI:            0.418 m AV Peak Grad:      17.8 mmHg AV Mean Grad:      9.0 mmHg LVOT Vmax:         155.00 cm/s LVOT Vmean:        107.000 cm/s LVOT VTI:          0.371 m LVOT/AV VTI ratio: 0.89  AORTA Ao Root diam: 2.90 cm MITRAL VALVE                TRICUSPID VALVE MV Area (PHT): 4.49 cm     TR Peak grad:   36.2 mmHg MV Decel Time: 169 msec     TR Vmax:        301.00 cm/s MR Peak grad: 81.0 mmHg MR Vmax:      450.00 cm/s   SHUNTS MV E velocity: 114.00 cm/s  Systemic VTI:  0.37 m MV A velocity: 94.30 cm/s   Systemic Diam: 1.90 cm MV E/A ratio:  1.21 Landscape architect signed by Phineas Inches Signature Date/Time: 12/02/2021/11:32:23 AM    Final    US Paracentesis  Result Date: 12/01/2021 INDICATION: Patient with history of cirrhosis by imaging, portal hypertension, mild splenomegaly, ascites, pleural effusions, COVID-19 positive, worsening renal function; request received for diagnostic paracentesis. EXAM: ULTRASOUND GUIDED DIAGNOSTIC PARACENTESIS MEDICATIONS: 8 mL 1% lidocaine COMPLICATIONS: None immediate. PROCEDURE: Informed written consent was obtained from the patient via interpreter after a discussion of the risks, benefits and alternatives to treatment. A timeout was performed prior to the initiation of the procedure. Initial ultrasound scanning demonstrates a moderate amount of ascites within the right lower abdominal quadrant. The right lower abdomen was prepped and draped in the usual sterile fashion. 1%  lidocaine was used for local anesthesia. Following this, a 19 gauge, 10-cm, Yueh catheter was introduced. An ultrasound image was saved for documentation purposes. The paracentesis was performed. The catheter was removed and a dressing was applied. The patient tolerated the procedure well without immediate post procedural complication. FINDINGS: A total of approximately 180 cc of hazy, yellow fluid was removed. Samples were sent to the laboratory  as requested by the clinical team. IMPRESSION: Successful ultrasound-guided diagnostic paracentesis yielding 180 cc of peritoneal fluid. Read by: Rowe Robert, PA-C Electronically Signed   By: Sandi Mariscal M.D.   On: 12/01/2021 16:41   US Abdomen Limited RUQ (LIVER/GB)  Result Date: 12/01/2021 CLINICAL DATA:  Elevated LFTs. EXAM: ULTRASOUND ABDOMEN LIMITED RIGHT UPPER QUADRANT COMPARISON:  MRI of the abdomen without with contrast 06/29/2021. CT abdomen pelvis without contrast 12/01/2021 FINDINGS: Gallbladder: Gallbladder is contracted. Wall measures 5.3 mm. Pericholecystic fluid is present. Sludge is noted in the gallbladder. Common bile duct: Diameter: 2.2 mm, within normal limits. Liver: The liver is somewhat hyperechoic and heterogeneous. Nodular edge noted. Portal vein is patent on color Doppler imaging with normal direction of blood flow towards the liver. Other: Moderate abdominal ascites is present. Moderate right-sided pleural effusion is also noted. IMPRESSION: 1. Cirrhotic appearance of the liver. 2. Dominant ascites. 3. Gallbladder wall thickening may be artifactual given the gallbladder is shrunken and abdominal ascites are present. Electronically Signed   By: San Morelle M.D.   On: 12/01/2021 14:42   CT Abdomen Pelvis Wo Contrast  Result Date: 12/01/2021 CLINICAL DATA:  Abdominal pain, acute, nonlocalized EXAM: CT ABDOMEN AND PELVIS WITHOUT CONTRAST TECHNIQUE: Multidetector CT imaging of the abdomen and pelvis was performed following the standard  protocol without IV contrast. RADIATION DOSE REDUCTION: This exam was performed according to the departmental dose-optimization program which includes automated exposure control, adjustment of the mA and/or kV according to patient size and/or use of iterative reconstruction technique. COMPARISON:  MRI 06/29/2021 FINDINGS: Lower chest: Small layering bilateral pleural effusions with mild dependent bibasilar airspace opacities. Heart size within normal limits. Hepatobiliary: Nodular hepatic surface contour. No focal liver lesion identified on unenhanced imaging. Probable small stone in the region of the gallbladder neck. Gallbladder wall appears thickened. Pancreas: Unremarkable. No pancreatic ductal dilatation or surrounding inflammatory changes. Spleen: Mild splenomegaly.  No focal splenic lesion identified. Adrenals/Urinary Tract: Unremarkable adrenal glands. 2.9 cm slightly hyperdense lesion arising anteriorly from the lower pole of the left kidney previously seen to be a benign cyst on prior MRI. Kidneys otherwise within normal limits. No renal lesion or hydronephrosis. Urinary bladder is unremarkable. Stomach/Bowel: Stomach is within normal limits. Appendix appears normal. Left-sided colonic diverticulosis. No evidence of bowel wall thickening, distention, or inflammatory changes. Vascular/Lymphatic: Scattered aortoiliac atherosclerotic calcifications without aneurysm. No abdominopelvic lymphadenopathy. Reproductive: Uterus and bilateral adnexa are unremarkable. Other: Moderate volume ascites.  No pneumoperitoneum. Musculoskeletal: Anasarca. Prior T12-L5 posterior spinal fusion. Chronic appearing severe compression fracture of the L3 vertebral body. IMPRESSION: 1. Cirrhotic liver with evidence of portal hypertension including mild splenomegaly and moderate volume ascites. 2. Small layering bilateral pleural effusions with mild dependent bibasilar airspace opacities, which may represent atelectasis and/or  pneumonia. 3. Probable small stone in the region of the gallbladder neck with associated gallbladder wall thickening. If there is clinical concern for acute cholecystitis, further evaluation with right upper quadrant ultrasound is recommended. 4. Colonic diverticulosis without evidence of acute diverticulitis. 5. Chronic appearing severe compression fracture of the L3 vertebral body. 6. Aortic Atherosclerosis (ICD10-I70.0). Electronically Signed   By: Davina Poke D.O.   On: 12/01/2021 13:40   DG Chest Port 1 View  Result Date: 12/01/2021 CLINICAL DATA:  Shortness of breath, edema both lower extremities EXAM: PORTABLE CHEST 1 VIEW COMPARISON:  None Available. FINDINGS: Transverse diameter of heart is increased. Central pulmonary vessels are prominent. There are no signs of alveolar pulmonary edema or focal pulmonary consolidation. Small linear  density in the left lower lung fields may suggest scarring or subsegmental atelectasis. There is no pleural effusion or pneumothorax. IMPRESSION: Central pulmonary vessels are prominent, possibly suggesting mild CHF. There is no focal pulmonary consolidation. Small transverse linear density in the left lower lung fields may suggest scarring or subsegmental atelectasis. Electronically Signed   By: Elmer Picker M.D.   On: 12/01/2021 12:33    Labs:  CBC: Recent Labs    12/13/21 0509 12/14/21 0605 12/15/21 0559 12/16/21 0250  WBC 5.3 5.1 5.3 4.6  HGB 8.2* 8.2* 8.1* 7.4*  HCT 25.8* 25.4* 25.5* 22.8*  PLT 105* 101* 102* 98*    COAGS: Recent Labs    12/01/21 1435 12/08/21 1026 12/10/21 1621  INR 1.4* 1.4* 1.3*    BMP: Recent Labs    12/13/21 0509 12/14/21 0605 12/15/21 0559 12/16/21 0250  NA 136 137 135 136  K 4.4 4.5 4.8 5.0  CL 107 108 107 106  CO2 20* 20* 20* 19*  GLUCOSE 97 119* 95 97  BUN 62* 62* 61* 59*  CALCIUM 8.3* 8.7* 8.3* 8.5*  CREATININE 4.77* 4.69* 4.78* 5.14*  GFRNONAA 9* 10* 9* 9*    LIVER FUNCTION  TESTS: Recent Labs    12/10/21 0623 12/11/21 0611 12/12/21 0502 12/13/21 0509  BILITOT 0.9 0.7 0.7 0.7  AST 27 26 26 27   ALT 17 17 18 17   ALKPHOS 94 96 88 84  PROT 6.3* 6.5 6.3* 6.6  ALBUMIN 2.7* 2.9* 2.8* 2.8*    TUMOR MARKERS: No results for input(s): "AFPTM", "CEA", "CA199", "CHROMGRNA" in the last 8760 hours.  Assessment and Plan:  CKD, with AKI --in need of dialysis --will plan for tunneled dialysis catheter today --unable to do conscious sedation due to non NPO status, having had breakfast this morning.  IR nurses aware and will proceed using a single agent. --conversation with patient achieved with the assistance of the tablet interpretation software.  Risks and benefits discussed with the patient including, but not limited to bleeding, infection, vascular injury, pneumothorax which may require chest tube placement, air embolism or even death  All of the patient's questions were answered, patient is agreeable to proceed. Consent signed and in chart.   Thank you for this interesting consult.  I greatly enjoyed meeting Tammra Pressman and look forward to participating in their care.  A copy of this report was sent to the requesting provider on this date.  Electronically Signed: Pasty Spillers, PA 12/16/2021, 9:27 AM   I spent a total of 20 Minutes in face to face in clinical consultation, greater than 50% of which was counseling/coordinating care for hemodialysis catheter placement.

## 2021-12-16 NOTE — Progress Notes (Signed)
C/O of pain & soreness at HD Cath site. Offered Tylenol for relief. Patient declined and stated she will wait until she gets in her room.

## 2021-12-16 NOTE — Plan of Care (Signed)
  Problem: Respiratory: Goal: Will maintain a patent airway Outcome: Progressing   Problem: Clinical Measurements: Goal: Respiratory complications will improve Outcome: Progressing Goal: Cardiovascular complication will be avoided Outcome: Progressing   Problem: Activity: Goal: Risk for activity intolerance will decrease Outcome: Progressing   Problem: Nutrition: Goal: Adequate nutrition will be maintained Outcome: Progressing   Problem: Elimination: Goal: Will not experience complications related to urinary retention Outcome: Progressing

## 2021-12-16 NOTE — Progress Notes (Signed)
[facility] Progress Note  Subjective: pt w/o new c/o's. Creat up 5.1 today. TDC placed this am by IR.   Vitals:   12/16/21 1110 12/16/21 1120 12/16/21 1440 12/16/21 1455  BP: 117/65 118/62 132/66 125/63  Pulse: 68 68 65 66  Resp: '19 18 12 18  ' Temp:  97.7 F (36.5 C) 97.6 F (36.4 C)   TempSrc:   Oral   SpO2: 98% 98% 99% 99%  Weight:   62.7 kg   Height:        Exam: WOE:[id number] resting in bed BBC:WUGQB regular rhythm and normal rate, normal s1 and s2 Resp: Clear to auscultation bilaterally, no rales/rhonchi. VQX:IHWT, slightly distended, non-tender, bowel sounds normal Ext: No lower extremity edema  Home meds include - norvasc 2.5, esomeprazole, ferous gluconate, fluticasone, levothyroxine, meteprolol succinate xl 50 qd, ergocalciferol       Date                         Creat               eGFR    July 2022                  1.41                 41 ml/min     Sept 2022                 1.69                 31 ml/min    Oct 2022                  1.85                     Feb 2023                  2.17                 23 ml/min    12/01/21                     2.93    12/02/21                     3.21                 15      UA - >50 rbcs, 0-5 wbcs, no bacteria, 0-5 epis, clear, yellow, mod Hb, > 300 prot     CXR - 6/01 > IMPRESSION: Central pulmonary vessels are prominent, possibly suggesting mild CHF. There is no focal pulmonary consolidation. Small transverse linear density in the left lower lung fields may suggest scarring or subsegmental atelectasis   UP/C ratio = 6.8 gm    UNa 48,  UCr 86    Renal US - 7.2 and 7.0 kidneys w/o hydro, + echotexture bilat    Urine sediment (6/03, undersigned) - showed numerous dysmorphic RBC's, one rbc cast and rare granular casts, mild pyuria.   Assessment/ Plan: Acute kidney Injury on CKD Stage IV: Baseline creatinine presumed to be about 2.1 with gradual progression over the past year suspected to be from hypertension  given preceding history and presentation with hypertensive urgency.  Less likely that she has type II HRS based on presentation and lack of response to earlier therapies.  The presence of RBCs with likely RBC  fracture cast seen by Dr. Jonnie Finner on analysis of the urine sediment raised concern for acute GN, however serologies including antinuclear antibody and ANCA were negative.  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  We recommended biopsy for diagnosis and prognosis - biopsy was done 6/13 by IR. Creat worse now in the high in the 5s. Pt has early uremic signs and needs HD. Biopsy results are pending. HD today and tomorrow.  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   HTN urgency: blood pressures better controlled w/ addition of hydralazine to amlodipine and metoprolol xl.  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   [name], MD 12/14/2021, 9:49 AM  Recent Labs  Lab 12/12/21 0502 12/13/21 0509 12/14/21 0605 12/15/21 0559 12/16/21 0250  HGB 8.0* 8.2*   < > 8.1* 7.4*  ALBUMIN 2.8* 2.8*  --   --   --   CALCIUM 8.6* 8.3*   < > 8.3* 8.5*  PHOS 3.8 4.2  --   --   --   CREATININE 4.76* 4.77*   < > 4.78* 5.14*  K 4.4 4.4   < > 4.8 5.0   < > = values in this interval not displayed.    Inpatient medications:  amLODipine  10 mg Oral Daily   vitamin C  500 mg Oral Daily   Chlorhexidine Gluconate Cloth  6 each Topical Q0600   docusate sodium  100 mg Oral BID   folic acid  1 mg Oral Daily   heparin injection (subcutaneous)  5,000 Units Subcutaneous Q8H   heparin sodium (porcine)       hydrALAZINE  25 mg Oral Q8H   insulin aspart  0-15 Units Subcutaneous TID  WC   insulin aspart  0-5 Units Subcutaneous QHS   lactulose  30 g Oral TID   levothyroxine  25 mcg Oral Daily   lidocaine       melatonin  3 mg Oral Once   metoprolol succinate  50 mg Oral Daily   midazolam       multivitamin with minerals  1 tablet Oral Daily   pantoprazole  40 mg Oral BID   thiamine  100 mg Oral Daily   zinc sulfate  220 mg Oral Daily    acetaminophen **OR** acetaminophen, chlorpheniramine-HYDROcodone, fluticasone, guaiFENesin-dextromethorphan, heparin sodium (porcine), hydrALAZINE, Ipratropium-Albuterol, lidocaine, melatonin, midazolam, ondansetron **OR** ondansetron (ZOFRAN) IV, oxyCODONE, polyethylene glycol, traZODone

## 2021-12-17 DIAGNOSIS — K7031 Alcoholic cirrhosis of liver with ascites: Secondary | ICD-10-CM | POA: Diagnosis not present

## 2021-12-17 LAB — CBC WITH DIFFERENTIAL/PLATELET
Abs Immature Granulocytes: 0.02 10*3/uL (ref 0.00–0.07)
Basophils Absolute: 0.1 10*3/uL (ref 0.0–0.1)
Basophils Relative: 2 %
Eosinophils Absolute: 0.1 10*3/uL (ref 0.0–0.5)
Eosinophils Relative: 4 %
HCT: 21.8 % — ABNORMAL LOW (ref 36.0–46.0)
Hemoglobin: 7.1 g/dL — ABNORMAL LOW (ref 12.0–15.0)
Immature Granulocytes: 1 %
Lymphocytes Relative: 23 %
Lymphs Abs: 0.8 10*3/uL (ref 0.7–4.0)
MCH: 30.6 pg (ref 26.0–34.0)
MCHC: 32.6 g/dL (ref 30.0–36.0)
MCV: 94 fL (ref 80.0–100.0)
Monocytes Absolute: 0.6 10*3/uL (ref 0.1–1.0)
Monocytes Relative: 17 %
Neutro Abs: 1.8 10*3/uL (ref 1.7–7.7)
Neutrophils Relative %: 53 %
Platelets: 81 10*3/uL — ABNORMAL LOW (ref 150–400)
RBC: 2.32 MIL/uL — ABNORMAL LOW (ref 3.87–5.11)
RDW: 13.6 % (ref 11.5–15.5)
WBC: 3.3 10*3/uL — ABNORMAL LOW (ref 4.0–10.5)
nRBC: 0 % (ref 0.0–0.2)

## 2021-12-17 LAB — BASIC METABOLIC PANEL
Anion gap: 10 (ref 5–15)
BUN: 23 mg/dL (ref 8–23)
CO2: 25 mmol/L (ref 22–32)
Calcium: 7.8 mg/dL — ABNORMAL LOW (ref 8.9–10.3)
Chloride: 96 mmol/L — ABNORMAL LOW (ref 98–111)
Creatinine, Ser: 3.05 mg/dL — ABNORMAL HIGH (ref 0.44–1.00)
GFR, Estimated: 16 mL/min — ABNORMAL LOW (ref >60)
Glucose, Bld: 96 mg/dL (ref 70–99)
Potassium: 3.5 mmol/L (ref 3.5–5.1)
Sodium: 131 mmol/L — ABNORMAL LOW (ref 135–145)

## 2021-12-17 LAB — HEPATITIS B SURFACE ANTIBODY, QUANTITATIVE: Hep B S AB Quant (Post): <3.1 m[IU]/mL — ABNORMAL LOW (ref >9.9)

## 2021-12-17 LAB — GLUCOSE, CAPILLARY
Glucose-Capillary: 114 mg/dL — ABNORMAL HIGH (ref 70–99)
Glucose-Capillary: 108 mg/dL — ABNORMAL HIGH (ref 70–99)
Glucose-Capillary: 136 mg/dL — ABNORMAL HIGH (ref 70–99)

## 2021-12-17 LAB — AMMONIA: Ammonia: 46 umol/L — ABNORMAL HIGH (ref 9–35)

## 2021-12-17 LAB — MRSA NEXT GEN BY PCR, NASAL: MRSA by PCR Next Gen: NOT DETECTED

## 2021-12-17 MED ORDER — HEPARIN SODIUM (PORCINE) 1000 UNIT/ML IJ SOLN
INTRAMUSCULAR | Status: AC
  Filled 2021-12-17: qty 4

## 2021-12-17 NOTE — Progress Notes (Addendum)
PROGRESS NOTE  Stacey Chambers  RJJ:[id number] DOB: 10-19-1950 DOA: 12/01/2021 PCP: Trey Sailors, PA   Brief Narrative:  Patient is a 71 year old female with history of chronic iron deficiency anemia, diabetes type 2, hypertension, hyperlipidemia, hypothyroidism, CKD stage IIIb, alcohol abuse who presented initially with lower extremity swelling, elevated blood pressure, fatigue ,dyspneic on ambulation.  After admission, she was found to have new onset liver cirrhosis with portal hypertension, splenomegaly, moderate volume ascites.  Also noted to have acute kidney injury on CKD.  COVID screen test was also positive.  GI was consulted.she underwent liver biopsy and renal biopsy during this hospitalization.  Nephrology also consulted for worsening kidney function and she was transferred to [facility] to be started on dialysis.  Underwent tunneled hemodialysis catheter placement by IR on 6/16, started on dialysis.  Assessment & Plan:  Principal Problem:   Cirrhosis ([place]) Active Problems:   History of gastric ulcer   History of thrombocytopenia   History of iron deficiency anemia   Gastric ulcer   Dyspnea on exertion   Swelling of lower extremity   Acute kidney injury superimposed on CKD ([place])   Diabetes mellitus type 2 with complications (HCC)   Acute diastolic CHF (congestive heart failure) ([place])   2019 novel coronavirus disease (COVID-19)   Hypertensive urgency   Diabetes mellitus type 2, controlled, without complications ([place])   Acute renal failure superimposed on stage 3b chronic kidney disease (HCC)   Nephrotic syndrome   Normocytic anemia   Low serum albumin   Thrombocytopenia (HCC)   Liver disease, chronic, with cirrhosis ([place])   AKI on CKD stage IIIb/now ESRD: Progressive worsening the kidney function.  Baseline creatinine around 2.1 with gradual progression suspected to be from hypertension.  Serology negative for antinuclear antibody, ANCA.  Underwent renal  biopsy.  Biopsy positive for IgA nephropathy.  .  Creatinine gradually worsened, developing uremic signs.  Nephrology recommended dialysis.  Transferred to [facility].  IR consulted and she underwent tunneled hemodialysis catheter placement 6/16.  Status post 2 sessions of dialysis  New onset decompensated liver cirrhosis/portal hypertension/splenomegaly/ascites: Acute hepatitis panel negative.  Diagnostic paracentesis was negative for SBP, fluid was transudative in nature.  Echo showed EF of 60 to 01%, grade 2 diastolic dysfunction.  No focal lesion on CT scan.  Alpha-fetoprotein normal, negative ANA, AMA, alpha antitrypsin antibody, no evidence of hemochromatosis.  Biopsy showed cirrhosis of uncertain etiology.  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  No indication of steroids.  She needs referral to outpatient liver transplant center /hepatology in the future.  Elevated ammonia level: Monitor.  Started on lactulose.  Titrate for 3-4 loose bowel movements a day   Hypertensive urgency: Continue current medications.  Monitor blood pressure.  Currently on hydralazine, amlodipine, metoprolol  COVID illness: Screen test was positive.  No significant hypoxia.  G.Got molnupiravir.  Continue vitamin C, zinc.  Off isolation  History of gastric ulcer/GERD: Continue PPI  Diabetes type 2: A1c of 4.9.  Takes glipizide at home.  Currently on sliding scale  Hypothyroidism: Continue Synthyroid  Normocytic anemia: Likely associated with CKD.  Hemoglobin in the range of 7.  Check CBC tomorrow  Thrombocytopenia: Most likely secondary to  cirrhosis.  Continue to monitor, stable  Goals of care: Multiple comorbidities with organ dysfunction/failure.  Palliative care following.  Remains full code. Patient lives alone, no frequent contact with family.          DVT prophylaxis:heparin injection 5,000 Units Start: 12/13/21 2200 SCDs Start: 12/01/21 1826  Code Status: Full Code  Family Communication: None at  the bedside  Patient status:Inpatient  Patient is from :Home  Anticipated discharge FE:XMDY  Estimated DC date: After nephrology clearance   Consultants: GI, nephrology, palliative care  Procedures: Kidney biopsy, liver biopsy, paracentesis  Antimicrobials:  Anti-infectives (From admission, onward)    Start     Dose/Rate Route Frequency Ordered Stop   12/01/21 1900  molnupiravir EUA (LAGEVRIO) capsule 800 mg        4 capsule Oral 2 times daily 12/01/21 1546 12/06/21 0904       Subjective: Patient seen and examined at the bedside this morning at dialysis suite.  Hemodynamically stable.  Comfortable.  On room air.  Denies abdomen pain, nausea, vomiting.  Abdomen is distended but nontender.  Objective: Vitals:   12/17/21 0930 12/17/21 1000 12/17/21 1030 12/17/21 1102  BP: (!) 122/59 (!) 131/59 134/61 (!) 144/65  Pulse: 86 89 91 94  Resp: '17 15 15 15  ' Temp:    98 F (36.7 C)  TempSrc:    Oral  SpO2: 97% 97% 98% 97%  Weight:      Height:        Intake/Output Summary (Last 24 hours) at 12/17/2021 1126 Last data filed at 12/17/2021 0900 Gross per 24 hour  Intake 220 ml  Output 0 ml  Net 220 ml   Filed Weights   12/16/21 1755 12/17/21 0409 12/17/21 0736  Weight: 61.7 kg 61.5 kg 62.3 kg    Examination:  General exam: Overall comfortable, not in distress, pleasant female HEENT: PERRL Respiratory system:  no wheezes or crackles  Cardiovascular system: S1 & S2 heard, RRR.  Dialysis catheter on the chest Gastrointestinal system: Abdomen is distended, soft and nontender. Central nervous system: Alert and oriented Extremities: No edema, no clubbing ,no cyanosis Skin: No rashes, no ulcers,no icterus     Data Reviewed: I have personally reviewed following labs and imaging studies  CBC: Recent Labs  Lab 12/13/21 0509 12/14/21 0605 12/15/21 0559 12/16/21 0250 12/17/21 0312  WBC 5.3 5.1 5.3 4.6 3.3*  NEUTROABS 3.5 3.4 3.4 3.0 1.8  HGB 8.2* 8.2* 8.1* 7.4* 7.1*   HCT 25.8* 25.4* 25.5* 22.8* 21.8*  MCV 95.2 95.8 95.9 94.6 94.0  PLT 105* 101* 102* 98* 81*   Basic Metabolic Panel: Recent Labs  Lab 12/11/21 0611 12/12/21 0502 12/13/21 0509 12/14/21 0605 12/15/21 0559 12/16/21 0250 12/17/21 0312  NA 139 138 136 137 135 136 131*  K 4.2 4.4 4.4 4.5 4.8 5.0 3.5  CL 109 108 107 108 107 106 96*  CO2 21* 22 20* 20* 20* 19* 25  GLUCOSE 132* 112* 97 119* 95 97 96  BUN 61* 62* 62* 62* 61* 59* 23  CREATININE 4.71* 4.76* 4.77* 4.69* 4.78* 5.14* 3.05*  CALCIUM 8.3* 8.6* 8.3* 8.7* 8.3* 8.5* 7.8*  MG 2.1 2.1 2.1  --   --   --   --   PHOS 4.1 3.8 4.2  --   --   --   --      No results found for this or any previous visit (from the past 240 hour(s)).   Radiology Studies: IR Fluoro Guide CV Line Right  Result Date: 12/16/2021 INDICATION: ESRD requiring HD. EXAM: TUNNELED CENTRAL VENOUS HEMODIALYSIS CATHETER PLACEMENT WITH ULTRASOUND AND FLUOROSCOPIC GUIDANCE MEDICATIONS: None. ANESTHESIA/SEDATION: Local anesthetic and single agent sedation was employed during this procedure. A total of Versed 1 mg was administered intravenously. The patient's level of consciousness and vital signs were  monitored continuously by radiology nursing throughout the procedure under my direct supervision. FLUOROSCOPY TIME:  Fluoroscopic dose; 4 mGy COMPLICATIONS: None immediate. PROCEDURE: Informed written consent was obtained from the the patient and/or patient's representative after a discussion of the risks, benefits, and alternatives to treatment. Questions regarding the procedure were encouraged and answered. The RIGHT neck and chest were prepped with chlorhexidine in a sterile fashion, and a sterile drape was applied covering the operative field. Maximum barrier sterile technique with sterile gowns and gloves were used for the procedure. A timeout was performed prior to the initiation of the procedure. After creating a small venotomy incision, a micropuncture kit was utilized to  access the internal jugular vein. Real-time ultrasound guidance was utilized for vascular access including the acquisition of a permanent ultrasound image documenting patency of the accessed vessel. The microwire was utilized to measure appropriate catheter length. A stiff Glidewire was advanced to the level of the IVC and the micropuncture sheath was exchanged for a peel-away sheath. A palindrome tunneled hemodialysis catheter measuring 19 cm from tip to cuff was tunneled in a retrograde fashion from the anterior chest wall to the venotomy incision. The catheter was then placed through the peel-away sheath with tips ultimately positioned within the superior aspect of the right atrium. Final catheter positioning was confirmed and documented with a spot radiographic image. The catheter aspirates and flushes normally. The catheter was flushed with appropriate volume heparin dwells. The catheter exit site was secured with a 2-0 Ethilon retention suture. The venotomy incision was closed with Dermabond. Dressings were applied. The patient tolerated the procedure well without immediate post procedural complication. IMPRESSION: Successful placement of 19 cm tip to cuff tunneled hemodialysis catheter via the RIGHT internal jugular vein, as above The tip of the catheter is positioned within the superior cavoatrial junction. The catheter is ready for immediate use. Michaelle Birks, MD [facility] Radiology Electronically Signed   By: Michaelle Birks M.D.   On: 12/16/2021 17:57   IR US Guide Vasc Access Right  Result Date: 12/16/2021 INDICATION: ESRD requiring HD. EXAM: TUNNELED CENTRAL VENOUS HEMODIALYSIS CATHETER PLACEMENT WITH ULTRASOUND AND FLUOROSCOPIC GUIDANCE MEDICATIONS: None. ANESTHESIA/SEDATION: Local anesthetic and single agent sedation was employed during this procedure. A total of Versed 1 mg was administered intravenously. The patient's level of consciousness and  vital signs were monitored continuously by radiology nursing throughout the procedure under my direct supervision. FLUOROSCOPY TIME:  Fluoroscopic dose; 4 mGy COMPLICATIONS: None immediate. PROCEDURE: Informed written consent was obtained from the the patient and/or patient's representative after a discussion of the risks, benefits, and alternatives to treatment. Questions regarding the procedure were encouraged and answered. The RIGHT neck and chest were prepped with chlorhexidine in a sterile fashion, and a sterile drape was applied covering the operative field. Maximum barrier sterile technique with sterile gowns and gloves were used for the procedure. A timeout was performed prior to the initiation of the procedure. After creating a small venotomy incision, a micropuncture kit was utilized to access the internal jugular vein. Real-time ultrasound guidance was utilized for vascular access including the acquisition of a permanent ultrasound image documenting patency of the accessed vessel. The microwire was utilized to measure appropriate catheter length. A stiff Glidewire was advanced to the level of the IVC and the micropuncture sheath was exchanged for a peel-away sheath. A palindrome tunneled hemodialysis catheter measuring 19 cm from tip to cuff was tunneled in a retrograde fashion from the anterior chest wall to the  venotomy incision. The catheter was then placed through the peel-away sheath with tips ultimately positioned within the superior aspect of the right atrium. Final catheter positioning was confirmed and documented with a spot radiographic image. The catheter aspirates and flushes normally. The catheter was flushed with appropriate volume heparin dwells. The catheter exit site was secured with a 2-0 Ethilon retention suture. The venotomy incision was closed with Dermabond. Dressings were applied. The patient tolerated the procedure well without immediate post procedural complication. IMPRESSION:  Successful placement of 19 cm tip to cuff tunneled hemodialysis catheter via the RIGHT internal jugular vein, as above The tip of the catheter is positioned within the superior cavoatrial junction. The catheter is ready for immediate use. Michaelle Birks, MD [facility] Radiology Electronically Signed   By: Michaelle Birks M.D.   On: 12/16/2021 17:57    Scheduled Meds:  amLODipine  10 mg Oral Daily   vitamin C  500 mg Oral Daily   Chlorhexidine Gluconate Cloth  6 each Topical Q0600   Chlorhexidine Gluconate Cloth  6 each Topical Q0600   docusate sodium  100 mg Oral BID   folic acid  1 mg Oral Daily   heparin injection (subcutaneous)  5,000 Units Subcutaneous Q8H   heparin sodium (porcine)       hydrALAZINE  25 mg Oral Q8H   insulin aspart  0-15 Units Subcutaneous TID WC   insulin aspart  0-5 Units Subcutaneous QHS   lactulose  30 g Oral TID   levothyroxine  25 mcg Oral Daily   melatonin  3 mg Oral Once   metoprolol succinate  50 mg Oral Daily   multivitamin with minerals  1 tablet Oral Daily   pantoprazole  40 mg Oral BID   thiamine  100 mg Oral Daily   zinc sulfate  220 mg Oral Daily   Continuous Infusions:   LOS: 16 days   Shelly Coss, MD [facility] P6/17/2023, 11:26 AM

## 2021-12-17 NOTE — Progress Notes (Signed)
Stacey Chambers Progress Note  Subjective: pt seen on HD.  Early review of biopsy showed an aggressive IgA nephropathy w/ sig chronicity changes. Also has some ATN. Final result pending.   Vitals:   12/17/21 1000 12/17/21 1030 12/17/21 1102 12/17/21 1141  BP: (!) 131/59 134/61 (!) 144/65 135/63  Pulse: 89 91 94 96  Resp: '15 15 15 18  ' Temp:   98 F (36.7 C) 98.5 F (36.9 C)  TempSrc:   Oral   SpO2: 97% 98% 97% 99%  Weight:      Height:        Exam: XVQ:[id number] resting in bed TOI:ZTIWP regular rhythm and normal rate, normal s1 and s2 Resp: Clear to auscultation bilaterally, no rales/rhonchi. YKD:XIPJ, slightly distended, non-tender, bowel sounds normal Ext: No lower extremity edema  Home meds include - norvasc 2.5, esomeprazole, ferous gluconate, fluticasone, levothyroxine, meteprolol succinate xl 50 qd, ergocalciferol       Date                         Creat               eGFR    July 2022                  1.41                 41 ml/min     Sept 2022                 1.69                 31 ml/min    Oct 2022                  1.85                     Feb 2023                  2.17                 23 ml/min    12/01/21                     2.93    12/02/21                     3.21                 15      UA - >50 rbcs, 0-5 wbcs, no bacteria, 0-5 epis, clear, yellow, mod Hb, > 300 prot     CXR - 6/01 > IMPRESSION: Central pulmonary vessels are prominent, possibly suggesting mild CHF. There is no focal pulmonary consolidation. Small transverse linear density in the left lower lung fields may suggest scarring or subsegmental atelectasis   UP/C ratio = 6.8 gm    UNa 48,  UCr 86    Renal US - 7.2 and 7.0 kidneys w/o hydro, + echotexture bilat    Urine sediment (6/03, undersigned) - showed numerous dysmorphic RBC's, one rbc cast and rare granular casts, mild pyuria.   Assessment/ Plan: Acute kidney Injury on CKD Stage IV: renal function worsened significantly in OP  setting over the last year with creat 2.9 on admission. UA showed high protein/ rbc's and urine sediment showed dysmorphic rbc's and 1 partial RBC cast. Renal biopsy 6/13 prelim result is back which showed an  aggressive IgA nephropathy w/ sig renal damage + some degree of ATN.  Serologies here were negative. Creat worsened up to 5.1 and HD was started on 6/16. IR placed [facility] 6/16. Getting 2nd HD today. Next HD Monday. Will need to start CLIP process Monday and get final biopsy results. Suspect this may be ESRD. D/w patient thru interpreter and questions answered.  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   Stacey Splinter, MD 12/14/2021, 9:49 AM  Recent Labs  Lab 12/12/21 0502 12/13/21 0509 12/14/21 0605 12/16/21 0250 12/17/21 0312  HGB 8.0* 8.2*   < > 7.4* 7.1*  ALBUMIN 2.8* 2.8*  --   --   --   CALCIUM 8.6* 8.3*   < > 8.5* 7.8*  PHOS 3.8 4.2  --   --   --   CREATININE 4.76* 4.77*   < > 5.14* 3.05*  K 4.4 4.4   < > 5.0 3.5   < > = values in this interval not displayed.    Inpatient medications:  amLODipine  10 mg Oral Daily   vitamin C  500 mg Oral Daily   Chlorhexidine Gluconate Cloth  6 each Topical Q0600   Chlorhexidine Gluconate Cloth  6 each Topical Q0600   docusate sodium  100 mg Oral BID   folic acid  1 mg Oral Daily   heparin injection (subcutaneous)  5,000 Units Subcutaneous Q8H   heparin sodium (porcine)       hydrALAZINE  25 mg Oral Q8H   insulin aspart  0-15 Units Subcutaneous TID WC   insulin aspart  0-5 Units Subcutaneous QHS   lactulose  30 g Oral TID   levothyroxine  25 mcg Oral Daily   melatonin  3 mg Oral Once   metoprolol succinate  50 mg Oral Daily    multivitamin with minerals  1 tablet Oral Daily   pantoprazole  40 mg Oral BID   thiamine  100 mg Oral Daily   zinc sulfate  220 mg Oral Daily    acetaminophen **OR** acetaminophen, chlorpheniramine-HYDROcodone, fluticasone, guaiFENesin-dextromethorphan, heparin sodium (porcine), hydrALAZINE, Ipratropium-Albuterol, melatonin, ondansetron **OR** ondansetron (ZOFRAN) IV, oxyCODONE, polyethylene glycol, traZODone

## 2021-12-17 NOTE — Plan of Care (Signed)
  Problem: Clinical Measurements: Goal: Respiratory complications will improve Outcome: Progressing Goal: Cardiovascular complication will be avoided Outcome: Progressing   Problem: Activity: Goal: Risk for activity intolerance will decrease Outcome: Progressing   Problem: Elimination: Goal: Will not experience complications related to urinary retention Outcome: Progressing   Problem: Pain Managment: Goal: General experience of comfort will improve Outcome: Progressing   

## 2021-12-18 DIAGNOSIS — K7031 Alcoholic cirrhosis of liver with ascites: Secondary | ICD-10-CM | POA: Diagnosis not present

## 2021-12-18 LAB — CBC WITH DIFFERENTIAL/PLATELET
Abs Immature Granulocytes: 0.01 10*3/uL (ref 0.00–0.07)
Basophils Absolute: 0 10*3/uL (ref 0.0–0.1)
Basophils Relative: 1 %
Eosinophils Absolute: 0.1 10*3/uL (ref 0.0–0.5)
Eosinophils Relative: 3 %
HCT: 22.8 % — ABNORMAL LOW (ref 36.0–46.0)
Hemoglobin: 7.4 g/dL — ABNORMAL LOW (ref 12.0–15.0)
Immature Granulocytes: 0 %
Lymphocytes Relative: 22 %
Lymphs Abs: 0.8 10*3/uL (ref 0.7–4.0)
MCH: 30.7 pg (ref 26.0–34.0)
MCHC: 32.5 g/dL (ref 30.0–36.0)
MCV: 94.6 fL (ref 80.0–100.0)
Monocytes Absolute: 0.7 10*3/uL (ref 0.1–1.0)
Monocytes Relative: 18 %
Neutro Abs: 2.1 10*3/uL (ref 1.7–7.7)
Neutrophils Relative %: 56 %
Platelets: 85 10*3/uL — ABNORMAL LOW (ref 150–400)
RBC: 2.41 MIL/uL — ABNORMAL LOW (ref 3.87–5.11)
RDW: 13.2 % (ref 11.5–15.5)
WBC: 3.7 10*3/uL — ABNORMAL LOW (ref 4.0–10.5)
nRBC: 0 % (ref 0.0–0.2)

## 2021-12-18 LAB — GLUCOSE, CAPILLARY
Glucose-Capillary: 158 mg/dL — ABNORMAL HIGH (ref 70–99)
Glucose-Capillary: 101 mg/dL — ABNORMAL HIGH (ref 70–99)
Glucose-Capillary: 110 mg/dL — ABNORMAL HIGH (ref 70–99)
Glucose-Capillary: 139 mg/dL — ABNORMAL HIGH (ref 70–99)

## 2021-12-18 LAB — BASIC METABOLIC PANEL
Anion gap: 9 (ref 5–15)
BUN: 11 mg/dL (ref 8–23)
CO2: 26 mmol/L (ref 22–32)
Calcium: 8.2 mg/dL — ABNORMAL LOW (ref 8.9–10.3)
Chloride: 97 mmol/L — ABNORMAL LOW (ref 98–111)
Creatinine, Ser: 2.61 mg/dL — ABNORMAL HIGH (ref 0.44–1.00)
GFR, Estimated: 19 mL/min — ABNORMAL LOW (ref >60)
Glucose, Bld: 101 mg/dL — ABNORMAL HIGH (ref 70–99)
Potassium: 3.5 mmol/L (ref 3.5–5.1)
Sodium: 132 mmol/L — ABNORMAL LOW (ref 135–145)

## 2021-12-18 LAB — AMMONIA: Ammonia: 24 umol/L (ref 9–35)

## 2021-12-18 MED ORDER — SODIUM CHLORIDE 0.9 % IV SOLN
125.0000 mg | Freq: Once | INTRAVENOUS | Status: AC
  Administered 2021-12-19: 125 mg via INTRAVENOUS
  Filled 2021-12-18: qty 10

## 2021-12-18 MED ORDER — LACTULOSE 10 GM/15ML PO SOLN
10.0000 g | Freq: Three times a day (TID) | ORAL | Status: DC
Start: 2021-12-18 — End: 2021-12-19
  Administered 2021-12-18 (×2): 10 g via ORAL
  Filled 2021-12-18 (×2): qty 15

## 2021-12-18 NOTE — Plan of Care (Signed)

## 2021-12-18 NOTE — Progress Notes (Signed)
PROGRESS NOTE  Stacey Chambers  OIT:[id number] DOB: March 29, 1951 DOA: 12/01/2021 PCP: Trey Sailors, PA   Brief Narrative:  Patient is a 71 year old female with history of chronic iron deficiency anemia, diabetes type 2, hypertension, hyperlipidemia, hypothyroidism, CKD stage IIIb, alcohol abuse who presented initially with lower extremity swelling, elevated blood pressure, fatigue ,dyspneic on ambulation.  After admission, she was found to have new onset liver cirrhosis with portal hypertension, splenomegaly, moderate volume ascites.  Also noted to have acute kidney injury on CKD.  COVID screen test was also positive.  GI was consulted.she underwent liver biopsy and renal biopsy during this hospitalization.  Nephrology also consulted for worsening kidney function and she was transferred to [facility] to be started on dialysis.  Underwent tunneled hemodialysis catheter placement by IR on 6/16, started on dialysis.  Assessment & Plan:  Principal Problem:   Cirrhosis ([place]) Active Problems:   History of gastric ulcer   History of thrombocytopenia   History of iron deficiency anemia   Gastric ulcer   Dyspnea on exertion   Swelling of lower extremity   Acute kidney injury superimposed on CKD ([place])   Diabetes mellitus type 2 with complications (HCC)   Acute diastolic CHF (congestive heart failure) ([place])   2019 novel coronavirus disease (COVID-19)   Hypertensive urgency   Diabetes mellitus type 2, controlled, without complications ([place])   Acute renal failure superimposed on stage 3b chronic kidney disease (HCC)   Nephrotic syndrome   Normocytic anemia   Low serum albumin   Thrombocytopenia (HCC)   Liver disease, chronic, with cirrhosis ([place])   AKI on CKD stage IIIb/now ESRD: Progressive worsening the kidney function.  Baseline creatinine around 2.1 with gradual progression suspected to be from hypertension.  Serology negative for antinuclear antibody, ANCA.  Underwent renal  biopsy.  Biopsy positive for IgA nephropathy.  .  Creatinine gradually worsened, developing uremic signs.  Nephrology recommended dialysis.  Transferred to [facility].  IR consulted and she underwent tunneled hemodialysis catheter placement 6/16.  Status post 2 sessions of dialysis, plan for tomorrow  New onset decompensated liver cirrhosis/portal hypertension/splenomegaly/ascites: Acute hepatitis panel negative.  Diagnostic paracentesis was negative for SBP, fluid was transudative in nature.  Echo showed EF of 60 to 58%, grade 2 diastolic dysfunction.  No focal lesion on CT scan.  Alpha-fetoprotein normal, negative ANA, AMA, alpha antitrypsin antibody, no evidence of hemochromatosis.  Biopsy showed cirrhosis of uncertain etiology.  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  No indication of steroids.  She needs to follow-up with hepatology as an outpatient  Elevated ammonia level: Monitor.  Started on lactulose.  Titrate for 3-4 loose bowel movements a day   Hypertensive urgency: Continue current medications.  Monitor blood pressure.  Currently on hydralazine, amlodipine, metoprolol.  Blood pressure better  COVID illness: Screen test was positive.  No significant hypoxia.  G.Got molnupiravir.  Continue vitamin C, zinc.  Off isolation  History of gastric ulcer/GERD: Continue PPI  Diabetes type 2: A1c of 4.9.  Takes glipizide at home.  Currently on sliding scale  Hypothyroidism: Continue Synthyroid  Normocytic anemia: Likely associated with CKD.  Hemoglobin in the range of 7.   Thrombocytopenia: Most likely secondary to  cirrhosis.  Continue to monitor, stable  Goals of care: Multiple comorbidities with organ dysfunction/failure.  Palliative care following.  Remains full code. Patient lives alone, no frequent contact with family.          DVT prophylaxis:heparin injection 5,000 Units Start: 12/13/21 2200 SCDs Start: 12/01/21 1826  Code Status: Full Code  Family Communication: None at the  bedside  Patient status:Inpatient  Patient is from :Home  Anticipated discharge BH:ALPF  Estimated DC date: After nephrology clearance   Consultants: GI, nephrology, palliative care  Procedures: Kidney biopsy, liver biopsy, paracentesis  Antimicrobials:  Anti-infectives (From admission, onward)    Start     Dose/Rate Route Frequency Ordered Stop   12/01/21 1900  molnupiravir EUA (LAGEVRIO) capsule 800 mg        4 capsule Oral 2 times daily 12/01/21 1546 12/06/21 0904       Subjective: Patient seen and examined the bedside this morning.  Hemodynamically stable.  Sitting in the chair.  Denies new complaints today.  Long discussion done about the further plan and current management by using video interpretor  Objective: Vitals:   12/17/21 2107 12/18/21 0500 12/18/21 0525 12/18/21 1050  BP: 111/63  124/65 120/67  Pulse: 70  80 74  Resp: 18  18 16   Temp: 98.4 F (36.9 C)  98.9 F (37.2 C) 99.1 F (37.3 C)  TempSrc: Oral  Oral Oral  SpO2: 98%  96% 97%  Weight:  62.3 kg    Height:        Intake/Output Summary (Last 24 hours) at 12/18/2021 1120 Last data filed at 12/18/2021 0900 Gross per 24 hour  Intake 720 ml  Output 0 ml  Net 720 ml   Filed Weights   12/17/21 0409 12/17/21 0736 12/18/21 0500  Weight: 61.5 kg 62.3 kg 62.3 kg    Examination:  General exam: Overall comfortable, not in distress HEENT: PERRL Respiratory system:  no wheezes or crackles  Cardiovascular system: S1 & S2 heard, RRR.  Dialysis catheter on the right chest Gastrointestinal system: Abdomen is distended, soft and nontender. Central nervous system: Alert and oriented Extremities: No edema, no clubbing ,no cyanosis Skin: No rashes, no ulcers,no icterus     Data Reviewed: I have personally reviewed following labs and imaging studies  CBC: Recent Labs  Lab 12/14/21 0605 12/15/21 0559 12/16/21 0250 12/17/21 0312 12/18/21 0311  WBC 5.1 5.3 4.6 3.3* 3.7*  NEUTROABS 3.4 3.4 3.0 1.8  2.1  HGB 8.2* 8.1* 7.4* 7.1* 7.4*  HCT 25.4* 25.5* 22.8* 21.8* 22.8*  MCV 95.8 95.9 94.6 94.0 94.6  PLT 101* 102* 98* 81* 85*   Basic Metabolic Panel: Recent Labs  Lab 12/12/21 0502 12/13/21 0509 12/14/21 0605 12/15/21 0559 12/16/21 0250 12/17/21 0312 12/18/21 0311  NA 138 136 137 135 136 131* 132*  K 4.4 4.4 4.5 4.8 5.0 3.5 3.5  CL 108 107 108 107 106 96* 97*  CO2 22 20* 20* 20* 19* 25 26  GLUCOSE 112* 97 119* 95 97 96 101*  BUN 62* 62* 62* 61* 59* 23 11  CREATININE 4.76* 4.77* 4.69* 4.78* 5.14* 3.05* 2.61*  CALCIUM 8.6* 8.3* 8.7* 8.3* 8.5* 7.8* 8.2*  MG 2.1 2.1  --   --   --   --   --   PHOS 3.8 4.2  --   --   --   --   --      Recent Results (from the past 240 hour(s))  MRSA Next Gen by PCR, Nasal     Status: None   Collection Time: 12/17/21 12:27 PM   Specimen: Nasal Mucosa; Nasal Swab  Result Value Ref Range Status   MRSA by PCR Next Gen NOT DETECTED NOT DETECTED Final    Comment: (NOTE) The GeneXpert MRSA Assay (FDA approved for NASAL specimens  only), is one component of a comprehensive MRSA colonization surveillance program. It is not intended to diagnose MRSA infection nor to guide or monitor treatment for MRSA infections. Test performance is not FDA approved in patients less than 73 years old. Performed at [facility], [place] [address] [place], [place] [address]      Radiology Studies: No results found.  Scheduled Meds:  amLODipine  10 mg Oral Daily   vitamin C  500 mg Oral Daily   Chlorhexidine Gluconate Cloth  6 each Topical Q0600   Chlorhexidine Gluconate Cloth  6 each Topical Q0600   docusate sodium  100 mg Oral BID   folic acid  1 mg Oral Daily   heparin injection (subcutaneous)  5,000 Units Subcutaneous Q8H   hydrALAZINE  25 mg Oral Q8H   insulin aspart  0-15 Units Subcutaneous TID WC   insulin aspart  0-5 Units Subcutaneous QHS   lactulose  30 g Oral TID   levothyroxine  25 mcg Oral Daily   melatonin  3 mg Oral Once   metoprolol  succinate  50 mg Oral Daily   multivitamin with minerals  1 tablet Oral Daily   pantoprazole  40 mg Oral BID   thiamine  100 mg Oral Daily   zinc sulfate  220 mg Oral Daily   Continuous Infusions:  [START ON 12/19/2021] ferric gluconate (FERRLECIT) IVPB       LOS: 17 days   Shelly Coss, MD [facility] P6/18/2023, 11:20 AM

## 2021-12-18 NOTE — Progress Notes (Signed)
[facility] Progress Note   Subjective:  Seen in room - sitting in chair by window. No CP/dyspnea. Has tolerated dialysis without major issues - still sore around [facility]. Prelim Bx with aggressive IgA nephropathy and ATN, final pending.  Objective Vitals:   12/17/21 1616 12/17/21 2107 12/18/21 0500 12/18/21 0525  BP: 124/62 111/63  124/65  Pulse: 81 70  80  Resp: '18 18  18  ' Temp: 98 F (36.7 C) 98.4 F (36.9 C)  98.9 F (37.2 C)  TempSrc:  Oral  Oral  SpO2: 99% 98%  96%  Weight:   62.3 kg   Height:       Physical Exam General: Well appearing woman, NAD. Room air Heart: RRR; no murmur Lungs: CTAB; no rales or wheezing Abdomen: soft Extremities: No LE edema Dialysis Access: TDC in R chest, bruising/tender to touch  Additional Objective Labs: Basic Metabolic Panel: Recent Labs  Lab 12/12/21 0502 12/13/21 0509 12/14/21 0605 12/16/21 0250 12/17/21 0312 12/18/21 0311  NA 138 136   < > 136 131* 132*  K 4.4 4.4   < > 5.0 3.5 3.5  CL 108 107   < > 106 96* 97*  CO2 22 20*   < > 19* 25 26  GLUCOSE 112* 97   < > 97 96 101*  BUN 62* 62*   < > 59* 23 11  CREATININE 4.76* 4.77*   < > 5.14* 3.05* 2.61*  CALCIUM 8.6* 8.3*   < > 8.5* 7.8* 8.2*  PHOS 3.8 4.2  --   --   --   --    < > = values in this interval not displayed.   Liver Function Tests: Recent Labs  Lab 12/12/21 0502 12/13/21 0509  AST 26 27  ALT 18 17  ALKPHOS 88 84  BILITOT 0.7 0.7  PROT 6.3* 6.6  ALBUMIN 2.8* 2.8*   CBC: Recent Labs  Lab 12/14/21 0605 12/15/21 0559 12/16/21 0250 12/17/21 0312 12/18/21 0311  WBC 5.1 5.3 4.6 3.3* 3.7*  NEUTROABS 3.4 3.4 3.0 1.8 2.1  HGB 8.2* 8.1* 7.4* 7.1* 7.4*  HCT 25.4* 25.5* 22.8* 21.8* 22.8*  MCV 95.8 95.9 94.6 94.0 94.6  PLT 101* 102* 98* 81* 85*   Blood Culture    Component Value Date/Time   SDES PERITONEAL 12/01/2021 2141   SDES PERITONEAL 12/01/2021 2141   SPECREQUEST NONE 12/01/2021 2141   SPECREQUEST NONE 12/01/2021 2141   CULT   12/01/2021 2141    NO GROWTH 5 DAYS Performed at [facility], [place] [address] [place], [place] [address]    REPTSTATUS 12/06/2021 FINAL 12/01/2021 2141   REPTSTATUS 12/01/2021 FINAL 12/01/2021 2141   Studies/Results: IR Fluoro Guide CV Line Right  Result Date: 12/16/2021 INDICATION: ESRD requiring HD. EXAM: TUNNELED CENTRAL VENOUS HEMODIALYSIS CATHETER PLACEMENT WITH ULTRASOUND AND FLUOROSCOPIC GUIDANCE MEDICATIONS: None. ANESTHESIA/SEDATION: Local anesthetic and single agent sedation was employed during this procedure. A total of Versed 1 mg was administered intravenously. The patient's level of consciousness and vital signs were monitored continuously by radiology nursing throughout the procedure under my direct supervision. FLUOROSCOPY TIME:  Fluoroscopic dose; 4 mGy COMPLICATIONS: None immediate. PROCEDURE: Informed written consent was obtained from the the patient and/or patient's representative after a discussion of the risks, benefits, and alternatives to treatment. Questions regarding the procedure were encouraged and answered. The RIGHT neck and chest were prepped with chlorhexidine in a sterile fashion, and a sterile drape was applied covering the operative field. Maximum barrier sterile technique  with sterile gowns and gloves were used for the procedure. A timeout was performed prior to the initiation of the procedure. After creating a small venotomy incision, a micropuncture kit was utilized to access the internal jugular vein. Real-time ultrasound guidance was utilized for vascular access including the acquisition of a permanent ultrasound image documenting patency of the accessed vessel. The microwire was utilized to measure appropriate catheter length. A stiff Glidewire was advanced to the level of the IVC and the micropuncture sheath was exchanged for a peel-away sheath. A palindrome tunneled hemodialysis catheter measuring 19 cm from tip to cuff was tunneled in a retrograde fashion  from the anterior chest wall to the venotomy incision. The catheter was then placed through the peel-away sheath with tips ultimately positioned within the superior aspect of the right atrium. Final catheter positioning was confirmed and documented with a spot radiographic image. The catheter aspirates and flushes normally. The catheter was flushed with appropriate volume heparin dwells. The catheter exit site was secured with a 2-0 Ethilon retention suture. The venotomy incision was closed with Dermabond. Dressings were applied. The patient tolerated the procedure well without immediate post procedural complication. IMPRESSION: Successful placement of 19 cm tip to cuff tunneled hemodialysis catheter via the RIGHT internal jugular vein, as above The tip of the catheter is positioned within the superior cavoatrial junction. The catheter is ready for immediate use. Michaelle Birks, MD [facility] Radiology Electronically Signed   By: Michaelle Birks M.D.   On: 12/16/2021 17:57   IR US Guide Vasc Access Right  Result Date: 12/16/2021 INDICATION: ESRD requiring HD. EXAM: TUNNELED CENTRAL VENOUS HEMODIALYSIS CATHETER PLACEMENT WITH ULTRASOUND AND FLUOROSCOPIC GUIDANCE MEDICATIONS: None. ANESTHESIA/SEDATION: Local anesthetic and single agent sedation was employed during this procedure. A total of Versed 1 mg was administered intravenously. The patient's level of consciousness and vital signs were monitored continuously by radiology nursing throughout the procedure under my direct supervision. FLUOROSCOPY TIME:  Fluoroscopic dose; 4 mGy COMPLICATIONS: None immediate. PROCEDURE: Informed written consent was obtained from the the patient and/or patient's representative after a discussion of the risks, benefits, and alternatives to treatment. Questions regarding the procedure were encouraged and answered. The RIGHT neck and chest were prepped with chlorhexidine in a sterile  fashion, and a sterile drape was applied covering the operative field. Maximum barrier sterile technique with sterile gowns and gloves were used for the procedure. A timeout was performed prior to the initiation of the procedure. After creating a small venotomy incision, a micropuncture kit was utilized to access the internal jugular vein. Real-time ultrasound guidance was utilized for vascular access including the acquisition of a permanent ultrasound image documenting patency of the accessed vessel. The microwire was utilized to measure appropriate catheter length. A stiff Glidewire was advanced to the level of the IVC and the micropuncture sheath was exchanged for a peel-away sheath. A palindrome tunneled hemodialysis catheter measuring 19 cm from tip to cuff was tunneled in a retrograde fashion from the anterior chest wall to the venotomy incision. The catheter was then placed through the peel-away sheath with tips ultimately positioned within the superior aspect of the right atrium. Final catheter positioning was confirmed and documented with a spot radiographic image. The catheter aspirates and flushes normally. The catheter was flushed with appropriate volume heparin dwells. The catheter exit site was secured with a 2-0 Ethilon retention suture. The venotomy incision was closed with Dermabond. Dressings were applied. The patient tolerated the procedure well without immediate post  procedural complication. IMPRESSION: Successful placement of 19 cm tip to cuff tunneled hemodialysis catheter via the RIGHT internal jugular vein, as above The tip of the catheter is positioned within the superior cavoatrial junction. The catheter is ready for immediate use. Michaelle Birks, MD [facility] Radiology Electronically Signed   By: Michaelle Birks M.D.   On: 12/16/2021 17:57    Medications:   amLODipine  10 mg Oral Daily   vitamin C  500 mg Oral Daily   Chlorhexidine  Gluconate Cloth  6 each Topical Q0600   Chlorhexidine Gluconate Cloth  6 each Topical Q0600   docusate sodium  100 mg Oral BID   folic acid  1 mg Oral Daily   heparin injection (subcutaneous)  5,000 Units Subcutaneous Q8H   hydrALAZINE  25 mg Oral Q8H   insulin aspart  0-15 Units Subcutaneous TID WC   insulin aspart  0-5 Units Subcutaneous QHS   lactulose  30 g Oral TID   levothyroxine  25 mcg Oral Daily   melatonin  3 mg Oral Once   metoprolol succinate  50 mg Oral Daily   multivitamin with minerals  1 tablet Oral Daily   pantoprazole  40 mg Oral BID   thiamine  100 mg Oral Daily   zinc sulfate  220 mg Oral Daily   Initial work-up: Urinalysis > 50 RBCs, 0-5 WBCs, >300 proteinuria. Sediment with numerous dysmorphic RBCs, one RBC cast Upr/C 6.8g/g, Una 48, Ucr 86 RUS: 7.2cm and 7cm kidneys with ^ echogenicity, no obstruction ANA/ANCA/anti-GBM negative, normal C3/4, Hep B/C/HIV negative. + COVID  Dialysis Orders: Establishing.  Assessment/Plan: 1. AKI on CKD 4: Renal function had been declining over the past year. Baseline Cr 2's, with Cr 2.9 on admit. UA with dysmorphic RBCs/RBC cast + heavy proteinuria. Serological w/u negative, as above. Renal Bx performed 6/13, prelim result = aggressive IgA nephropathy with chronic damage + some degree of ATN. Cr up to 5.1 on 6/16 with uremic symptoms. - TDC placed and HD #1 started on 12/16/21.  -  HD #2 on 12/17/21  - Cr lower today s/p 2 HD, follow tomorrow's AM labs and urine output.  - Next HD planned for Monday 6/19.  - Await final Bx report and follow labs. Likely start CLIP process on Monday.  2. New Dx cirrhosis/portal HTN: GI following. Hep panel negative. Diagnostic paracentesis neg for SBP. S/p liver Bx - unspecified cirrhosis. Urinary copper ^, low ceruloplasmin concerning for Wilson's, but unlikely per GI note - they feel c/w burned out NASH. ^ ammonia, started on lactulose. Will need outpatient GI follow-up. 3. HTN urgency/volume: BP  very high with edema/dyspnea on admit - improved now. Continue amlodipine, hydralazine, metoprolol. 4. Anemia of CKD: Hgb 7.4 - slightly improvement. Tsat 6% on 12/03/21 -> start course of IV iron with HD. 5. Secondary hyperparathyroidism: CorrCa/Phos ok. No binders. Will check PTH. 6. Nutrition: Alb low, significant proteinuria. 7. COVID: Dx on admit, no significant hypoxia. S/p course of molnupiravir, ongoing Vit C and Zinc.  Veneta Penton, PA-C 12/18/2021, 9:57 AM  [facility]

## 2021-12-19 DIAGNOSIS — K7031 Alcoholic cirrhosis of liver with ascites: Secondary | ICD-10-CM | POA: Diagnosis not present

## 2021-12-19 LAB — BASIC METABOLIC PANEL
Anion gap: 11 (ref 5–15)
BUN: 20 mg/dL (ref 8–23)
CO2: 25 mmol/L (ref 22–32)
Calcium: 8.2 mg/dL — ABNORMAL LOW (ref 8.9–10.3)
Chloride: 95 mmol/L — ABNORMAL LOW (ref 98–111)
Creatinine, Ser: 4.34 mg/dL — ABNORMAL HIGH (ref 0.44–1.00)
GFR, Estimated: 11 mL/min — ABNORMAL LOW (ref >60)
Glucose, Bld: 102 mg/dL — ABNORMAL HIGH (ref 70–99)
Potassium: 3.8 mmol/L (ref 3.5–5.1)
Sodium: 131 mmol/L — ABNORMAL LOW (ref 135–145)

## 2021-12-19 LAB — CBC
HCT: 23.2 % — ABNORMAL LOW (ref 36.0–46.0)
Hemoglobin: 7.3 g/dL — ABNORMAL LOW (ref 12.0–15.0)
MCH: 30.4 pg (ref 26.0–34.0)
MCHC: 31.5 g/dL (ref 30.0–36.0)
MCV: 96.7 fL (ref 80.0–100.0)
Platelets: 93 10*3/uL — ABNORMAL LOW (ref 150–400)
RBC: 2.4 MIL/uL — ABNORMAL LOW (ref 3.87–5.11)
RDW: 13.2 % (ref 11.5–15.5)
WBC: 4.6 10*3/uL (ref 4.0–10.5)
nRBC: 0 % (ref 0.0–0.2)

## 2021-12-19 LAB — CBC WITH DIFFERENTIAL/PLATELET
Abs Immature Granulocytes: 0.01 10*3/uL (ref 0.00–0.07)
Basophils Absolute: 0.1 10*3/uL (ref 0.0–0.1)
Basophils Relative: 1 %
Eosinophils Absolute: 0.2 10*3/uL (ref 0.0–0.5)
Eosinophils Relative: 4 %
HCT: 22 % — ABNORMAL LOW (ref 36.0–46.0)
Hemoglobin: 7.2 g/dL — ABNORMAL LOW (ref 12.0–15.0)
Immature Granulocytes: 0 %
Lymphocytes Relative: 23 %
Lymphs Abs: 0.9 10*3/uL (ref 0.7–4.0)
MCH: 31 pg (ref 26.0–34.0)
MCHC: 32.7 g/dL (ref 30.0–36.0)
MCV: 94.8 fL (ref 80.0–100.0)
Monocytes Absolute: 0.7 10*3/uL (ref 0.1–1.0)
Monocytes Relative: 17 %
Neutro Abs: 2.2 10*3/uL (ref 1.7–7.7)
Neutrophils Relative %: 55 %
Platelets: 87 10*3/uL — ABNORMAL LOW (ref 150–400)
RBC: 2.32 MIL/uL — ABNORMAL LOW (ref 3.87–5.11)
RDW: 13.4 % (ref 11.5–15.5)
WBC: 4 10*3/uL (ref 4.0–10.5)
nRBC: 0 % (ref 0.0–0.2)

## 2021-12-19 LAB — RENAL FUNCTION PANEL
Albumin: 2.6 g/dL — ABNORMAL LOW (ref 3.5–5.0)
Anion gap: 10 (ref 5–15)
BUN: 21 mg/dL (ref 8–23)
CO2: 26 mmol/L (ref 22–32)
Calcium: 8.3 mg/dL — ABNORMAL LOW (ref 8.9–10.3)
Chloride: 96 mmol/L — ABNORMAL LOW (ref 98–111)
Creatinine, Ser: 4.19 mg/dL — ABNORMAL HIGH (ref 0.44–1.00)
GFR, Estimated: 11 mL/min — ABNORMAL LOW (ref >60)
Glucose, Bld: 159 mg/dL — ABNORMAL HIGH (ref 70–99)
Phosphorus: 4.1 mg/dL (ref 2.5–4.6)
Potassium: 3.8 mmol/L (ref 3.5–5.1)
Sodium: 132 mmol/L — ABNORMAL LOW (ref 135–145)

## 2021-12-19 LAB — AMMONIA: Ammonia: 69 umol/L — ABNORMAL HIGH (ref 9–35)

## 2021-12-19 LAB — GLUCOSE, CAPILLARY
Glucose-Capillary: 108 mg/dL — ABNORMAL HIGH (ref 70–99)
Glucose-Capillary: 96 mg/dL (ref 70–99)
Glucose-Capillary: 197 mg/dL — ABNORMAL HIGH (ref 70–99)
Glucose-Capillary: 123 mg/dL — ABNORMAL HIGH (ref 70–99)

## 2021-12-19 MED ORDER — HEPARIN SODIUM (PORCINE) 1000 UNIT/ML IJ SOLN
3200.0000 [IU] | Freq: Once | INTRAMUSCULAR | Status: AC
  Administered 2021-12-19: 3200 [IU] via INTRAVENOUS
  Filled 2021-12-19: qty 4

## 2021-12-19 MED ORDER — AMLODIPINE BESYLATE 5 MG PO TABS
5.0000 mg | ORAL_TABLET | Freq: Every day | ORAL | Status: DC
  Administered 2021-12-20 – 2021-12-27 (×6): 5 mg via ORAL
  Filled 2021-12-19 (×8): qty 1

## 2021-12-19 MED ORDER — LACTULOSE 10 GM/15ML PO SOLN
30.0000 g | Freq: Three times a day (TID) | ORAL | Status: DC
  Administered 2021-12-19 – 2021-12-20 (×5): 30 g via ORAL
  Administered 2021-12-21: 20 g via ORAL
  Administered 2021-12-21 – 2021-12-26 (×10): 30 g via ORAL
  Filled 2021-12-19 (×18): qty 45

## 2021-12-19 MED ORDER — DARBEPOETIN ALFA 40 MCG/0.4ML IJ SOSY
40.0000 ug | PREFILLED_SYRINGE | INTRAMUSCULAR | Status: DC
  Administered 2021-12-19: 40 ug via INTRAVENOUS
  Filled 2021-12-19 (×3): qty 0.4

## 2021-12-19 MED ORDER — HYDROCORTISONE 1 % EX OINT
TOPICAL_OINTMENT | Freq: Three times a day (TID) | CUTANEOUS | Status: DC
  Filled 2021-12-19 (×2): qty 28

## 2021-12-19 NOTE — Plan of Care (Signed)
  Problem: Coping: Goal: Psychosocial and spiritual needs will be supported Outcome: Progressing   Problem: Respiratory: Goal: Will maintain a patent airway Outcome: Progressing Goal: Complications related to the disease process, condition or treatment will be avoided or minimized Outcome: Progressing   Problem: Education: Goal: Knowledge of General Education information will improve Description: Including pain rating scale, medication(s)/side effects and non-pharmacologic comfort measures Outcome: Progressing   Problem: Health Behavior/Discharge Planning: Goal: Ability to manage health-related needs will improve Outcome: Progressing   Problem: Clinical Measurements: Goal: Ability to maintain clinical measurements within normal limits will improve Outcome: Progressing Goal: Will remain free from infection Outcome: Progressing Goal: Diagnostic test results will improve Outcome: Progressing Goal: Respiratory complications will improve Outcome: Progressing Goal: Cardiovascular complication will be avoided Outcome: Progressing   Problem: Activity: Goal: Risk for activity intolerance will decrease Outcome: Progressing   Problem: Nutrition: Goal: Adequate nutrition will be maintained Outcome: Progressing   Problem: Coping: Goal: Level of anxiety will decrease Outcome: Progressing   Problem: Elimination: Goal: Will not experience complications related to bowel motility Outcome: Progressing Goal: Will not experience complications related to urinary retention Outcome: Progressing   Problem: Pain Managment: Goal: General experience of comfort will improve Outcome: Progressing   Problem: Safety: Goal: Ability to remain free from injury will improve Outcome: Progressing   Problem: Skin Integrity: Goal: Risk for impaired skin integrity will decrease Outcome: Progressing

## 2021-12-19 NOTE — Progress Notes (Signed)
[facility] Progress Note   Subjective  last HD on 6/17 with no UF per charting.  Seen and examined on dialysis.  Blood pressure 93/53 and HR 74.  Procedure supervised.  RIJ Catheter in use. Minimal UF goal  Review of systems: Denies n/v Denies shortness of breath or chest pain     Objective Vitals:   12/19/21 0914 12/19/21 0930 12/19/21 1000 12/19/21 1003  BP: 118/63 (!) 116/58 (!) 98/52 (!) 98/52  Pulse: 73 71  71  Resp: 16 17  15   Temp:      TempSrc:      SpO2:      Weight:      Height:       Physical Exam   General adult female in bed in no acute distress HEENT normocephalic atraumatic extraocular movements intact sclera anicteric Neck supple trachea midline Lungs clear to auscultation bilaterally normal work of breathing at rest on room air Heart S1S2 no rub Abdomen soft nontender nondistended Extremities no edema  Psych normal mood and affect Access: RIJ tunn catheter in place  Additional Objective Labs: Basic Metabolic Panel: Recent Labs  Lab 12/13/21 0509 12/14/21 0605 12/17/21 0312 12/18/21 0311 12/19/21 0425  NA 136   < > 131* 132* 131*  K 4.4   < > 3.5 3.5 3.8  CL 107   < > 96* 97* 95*  CO2 20*   < > 25 26 25   GLUCOSE 97   < > 96 101* 102*  BUN 62*   < > 23 11 20   CREATININE 4.77*   < > 3.05* 2.61* 4.34*  CALCIUM 8.3*   < > 7.8* 8.2* 8.2*  PHOS 4.2  --   --   --   --    < > = values in this interval not displayed.   Liver Function Tests: Recent Labs  Lab 12/13/21 0509  AST 27  ALT 17  ALKPHOS 84  BILITOT 0.7  PROT 6.6  ALBUMIN 2.8*   CBC: Recent Labs  Lab 12/15/21 0559 12/16/21 0250 12/17/21 0312 12/18/21 0311 12/19/21 0425  WBC 5.3 4.6 3.3* 3.7* 4.0  NEUTROABS 3.4 3.0 1.8 2.1 2.2  HGB 8.1* 7.4* 7.1* 7.4* 7.2*  HCT 25.5* 22.8* 21.8* 22.8* 22.0*  MCV 95.9 94.6 94.0 94.6 94.8  PLT 102* 98* 81* 85* 87*   Blood Culture    Component Value Date/Time   SDES PERITONEAL 12/01/2021 2141   SDES PERITONEAL  12/01/2021 2141   SPECREQUEST NONE 12/01/2021 2141   SPECREQUEST NONE 12/01/2021 2141   CULT  12/01/2021 2141    NO GROWTH 5 DAYS Performed at [facility], [place] [address] [place], [place] [address]    REPTSTATUS 12/06/2021 FINAL 12/01/2021 2141   REPTSTATUS 12/01/2021 FINAL 12/01/2021 2141   Studies/Results: No results found.  Medications:  ferric gluconate (FERRLECIT) IVPB 125 mg (12/19/21 1003)    amLODipine  10 mg Oral Daily   vitamin C  500 mg Oral Daily   Chlorhexidine Gluconate Cloth  6 each Topical Q0600   Chlorhexidine Gluconate Cloth  6 each Topical Q0600   docusate sodium  100 mg Oral BID   folic acid  1 mg Oral Daily   heparin injection (subcutaneous)  5,000 Units Subcutaneous Q8H   hydrALAZINE  25 mg Oral Q8H   insulin aspart  0-15 Units Subcutaneous TID WC   insulin aspart  0-5 Units Subcutaneous QHS   lactulose  10 g Oral TID   levothyroxine  25  mcg Oral Daily   melatonin  3 mg Oral Once   metoprolol succinate  50 mg Oral Daily   multivitamin with minerals  1 tablet Oral Daily   pantoprazole  40 mg Oral BID   thiamine  100 mg Oral Daily   zinc sulfate  220 mg Oral Daily   Initial work-up: Urinalysis > 50 RBCs, 0-5 WBCs, >300 proteinuria. Sediment with numerous dysmorphic RBCs, one RBC cast Upr/C 6.8g/g, Una 48, Ucr 86 RUS: 7.2cm and 7cm kidneys with ^ echogenicity, no obstruction ANA/ANCA/anti-GBM negative, normal C3/4, Hep B/C/HIV negative. + COVID  Dialysis Orders: Establishing.  Prelim Bx with aggressive IgA nephropathy and ATN, final pending.  Assessment/Plan: 1. AKI on CKD 4: Renal function had been declining over the past year. Baseline Cr 2's, with Cr 2.9 on admit. UA with dysmorphic RBCs/RBC cast + heavy proteinuria. Serological w/u negative, as above. Renal Bx performed 6/13, prelim result = aggressive IgA nephropathy with chronic damage + some degree of ATN. Cr up to 5.1 on 6/16 with uremic symptoms.  [place] placed and HD #1 started on  12/16/21 with additional HD on 6/17 ------------------- - HD today and assess dialysis needs daily - Await final Bx report - have called [facility] and requested a call back - Started CLIP process - spoke with HD SW - at this time is AKI  - re-ordered strict ins/outs   2. New Dx cirrhosis/portal HTN: GI was consulted. Hep panel negative. Diagnostic paracentesis neg for SBP. S/p liver Bx - unspecified cirrhosis. Urinary copper ^, low ceruloplasmin concerning for Wilson's, but unlikely per GI note - they feel c/w burned out NASH. ^ ammonia, started on lactulose. Will need outpatient GI follow-up.  3. HTN urgency/volume: hypertension on admit - low now.  Will reduce amlodipine to 5 mg daily and place hold parameters on hydralazine   4. Anemia of CKD: sat 6% on 12/03/21 -> s/p IV iron.  Start aranesp 40 mcg weekly on Mondays.   5. Secondary hyperparathyroidism: Phos ok last check. No binders. Phos is pending and intact PTH pending  6. COVID: Dx on admit (6/1) no significant hypoxia. S/p course of molnupiravir, ongoing Vit C and Zinc.  She is off of isolation per charting.  Per primary team   Claudia Desanctis, MD 12/19/2021 10:39 AM

## 2021-12-19 NOTE — Progress Notes (Signed)
PROGRESS NOTE  Stacey Chambers  WUJ:[id number] DOB: [birth year] DOA: 12/01/2021 PCP: Trey Sailors, PA   Brief Narrative:  Patient is a 71 year old female with history of chronic iron deficiency anemia, diabetes type 2, hypertension, hyperlipidemia, hypothyroidism, CKD stage IIIb, alcohol abuse who presented initially with lower extremity swelling, elevated blood pressure, fatigue ,dyspneic on ambulation.  After admission, she was found to have new onset liver cirrhosis with portal hypertension, splenomegaly, moderate volume ascites.  Also noted to have acute kidney injury on CKD.  COVID screen test was also positive.  GI was consulted.she underwent liver biopsy and renal biopsy during this hospitalization.  Nephrology also consulted for worsening kidney function and she was transferred to [facility] to be started on dialysis.  Underwent tunneled hemodialysis catheter placement by IR on 6/16, started on dialysis.  Now awaiting outpatient dialysis management.  Assessment & Plan:  Principal Problem:   Cirrhosis ([place]) Active Problems:   History of gastric ulcer   History of thrombocytopenia   History of iron deficiency anemia   Gastric ulcer   Dyspnea on exertion   Swelling of lower extremity   Acute kidney injury superimposed on CKD ([place])   Diabetes mellitus type 2 with complications (HCC)   Acute diastolic CHF (congestive heart failure) ([place])   2019 novel coronavirus disease (COVID-19)   Hypertensive urgency   Diabetes mellitus type 2, controlled, without complications ([place])   Acute renal failure superimposed on stage 3b chronic kidney disease (HCC)   Nephrotic syndrome   Normocytic anemia   Low serum albumin   Thrombocytopenia (HCC)   Liver disease, chronic, with cirrhosis ([place])   AKI on CKD stage IIIb/now ESRD: Progressive worsening the kidney function.  Baseline creatinine around 2.1 with gradual progression suspected to be from hypertension.  Serology negative for  antinuclear antibody, ANCA.  Underwent renal biopsy.  Biopsy positive for IgA nephropathy.  .  Creatinine gradually worsened, developing uremic signs.  Nephrology recommended dialysis.  Transferred to [facility].  IR consulted and she underwent tunneled hemodialysis catheter placement 6/16.  Being dialyzed  New onset decompensated liver cirrhosis/portal hypertension/splenomegaly/ascites: Acute hepatitis panel negative.  Diagnostic paracentesis was negative for SBP, fluid was transudative in nature.  Echo showed EF of 60 to 95%, grade 2 diastolic dysfunction.  No focal lesion on CT scan.  Alpha-fetoprotein normal, negative ANA, AMA, alpha antitrypsin antibody, no evidence of hemochromatosis.  Biopsy showed cirrhosis of uncertain etiology.  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  No indication of steroids.  She needs to follow-up with hepatology as an outpatient  Elevated ammonia level: Monitor.  Started on lactulose.  Titrate for 3-4 loose bowel movements a day   Hypertensive urgency: Continue current medications.  Monitor blood pressure.  Currently on hydralazine, amlodipine, metoprolol.  Blood pressure better  COVID illness: Screen test was positive.  No significant hypoxia.  G.Got molnupiravir.  Continue vitamin C, zinc.  Off isolation  History of gastric ulcer/GERD: Continue PPI  Diabetes type 2: A1c of 4.9.  Takes glipizide at home.  Currently on sliding scale  Hypothyroidism: Continue Synthyroid  Normocytic anemia: Likely associated with CKD.  Hemoglobin in the range of 7.   Thrombocytopenia: Most likely secondary to  cirrhosis.  Continue to monitor, stable  Goals of care: Multiple comorbidities with organ dysfunction/failure.  Palliative care was consulted.  Remains full code. Patient lives alone, no frequent contact with family.  Son is in [place]          DVT prophylaxis:heparin injection 5,000 Units Start: 12/13/21  2200 SCDs Start: 12/01/21 1826     Code Status: Full  Code  Family Communication: None at the bedside  Patient status:Inpatient  Patient is from :Home  Anticipated discharge RS:WNIO  Estimated DC date: After nephrology clearance/outpatient dialysis arrangement   Consultants: GI, nephrology, palliative care  Procedures: Kidney biopsy, liver biopsy, paracentesis  Antimicrobials:  Anti-infectives (From admission, onward)    Start     Dose/Rate Route Frequency Ordered Stop   12/01/21 1900  molnupiravir EUA (LAGEVRIO) capsule 800 mg        4 capsule Oral 2 times daily 12/01/21 1546 12/06/21 0904       Subjective: Patient seen and examined at dialysis suite.  Hemodynamically stable.  Comfortable without any complaints.  Denies any abdominal pain, nausea vomiting or shortness of breath.  Objective: Vitals:   12/19/21 1000 12/19/21 1003 12/19/21 1030 12/19/21 1100  BP: (!) 98/52 (!) 98/52 (!) 110/54 (!) 109/58  Pulse:  71 73 71  Resp:  15 16 15   Temp:      TempSrc:      SpO2:      Weight:      Height:        Intake/Output Summary (Last 24 hours) at 12/19/2021 1119 Last data filed at 12/19/2021 0835 Gross per 24 hour  Intake 1080 ml  Output 0 ml  Net 1080 ml   Filed Weights   12/17/21 0736 12/18/21 0500 12/19/21 0901  Weight: 62.3 kg 62.3 kg 64.3 kg    Examination:  General exam: Overall comfortable, not in distress HEENT: PERRL Respiratory system:  no wheezes or crackles  Cardiovascular system: S1 & S2 heard, RRR.  Dialysis catheter on right chest Gastrointestinal system: Abdomen is nondistended, soft and nontender. Central nervous system: Alert and oriented Extremities: No edema, no clubbing ,no cyanosis Skin: No rashes, no ulcers,no icterus     Data Reviewed: I have personally reviewed following labs and imaging studies  CBC: Recent Labs  Lab 12/15/21 0559 12/16/21 0250 12/17/21 0312 12/18/21 0311 12/19/21 0425 12/19/21 0940  WBC 5.3 4.6 3.3* 3.7* 4.0 4.6  NEUTROABS 3.4 3.0 1.8 2.1 2.2  --   HGB  8.1* 7.4* 7.1* 7.4* 7.2* 7.3*  HCT 25.5* 22.8* 21.8* 22.8* 22.0* 23.2*  MCV 95.9 94.6 94.0 94.6 94.8 96.7  PLT 102* 98* 81* 85* 87* 93*   Basic Metabolic Panel: Recent Labs  Lab 12/13/21 0509 12/14/21 0605 12/16/21 0250 12/17/21 0312 12/18/21 0311 12/19/21 0425 12/19/21 0940  NA 136   < > 136 131* 132* 131* 132*  K 4.4   < > 5.0 3.5 3.5 3.8 3.8  CL 107   < > 106 96* 97* 95* 96*  CO2 20*   < > 19* 25 26 25 26   GLUCOSE 97   < > 97 96 101* 102* 159*  BUN 62*   < > 59* 23 11 20 21   CREATININE 4.77*   < > 5.14* 3.05* 2.61* 4.34* 4.19*  CALCIUM 8.3*   < > 8.5* 7.8* 8.2* 8.2* 8.3*  MG 2.1  --   --   --   --   --   --   PHOS 4.2  --   --   --   --   --  4.1   < > = values in this interval not displayed.     Recent Results (from the past 240 hour(s))  MRSA Next Gen by PCR, Nasal     Status: None   Collection Time: 12/17/21  12:27 PM   Specimen: Nasal Mucosa; Nasal Swab  Result Value Ref Range Status   MRSA by PCR Next Gen NOT DETECTED NOT DETECTED Final    Comment: (NOTE) The GeneXpert MRSA Assay (FDA approved for NASAL specimens only), is one component of a comprehensive MRSA colonization surveillance program. It is not intended to diagnose MRSA infection nor to guide or monitor treatment for MRSA infections. Test performance is not FDA approved in patients less than 61 years old. Performed at [facility], [place] [address], [place], [place] [address]      Radiology Studies: No results found.  Scheduled Meds:  [START ON 12/20/2021] amLODipine  5 mg Oral Daily   vitamin C  500 mg Oral Daily   Chlorhexidine Gluconate Cloth  6 each Topical Q0600   Chlorhexidine Gluconate Cloth  6 each Topical Q0600   darbepoetin (ARANESP) injection - DIALYSIS  40 mcg Intravenous Q Mon-HD   docusate sodium  100 mg Oral BID   folic acid  1 mg Oral Daily   heparin injection (subcutaneous)  5,000 Units Subcutaneous Q8H   hydrALAZINE  25 mg Oral Q8H   insulin aspart  0-15 Units  Subcutaneous TID WC   insulin aspart  0-5 Units Subcutaneous QHS   lactulose  10 g Oral TID   levothyroxine  25 mcg Oral Daily   melatonin  3 mg Oral Once   metoprolol succinate  50 mg Oral Daily   multivitamin with minerals  1 tablet Oral Daily   pantoprazole  40 mg Oral BID   thiamine  100 mg Oral Daily   zinc sulfate  220 mg Oral Daily   Continuous Infusions:     LOS: 18 days   Shelly Coss, MD [facility] P6/19/2023, 11:19 AM

## 2021-12-19 NOTE — Progress Notes (Signed)
Requested to see pt for out-pt HD needs. Met with pt at bedside while receiving HD with interpreter, Spero Geralds. Introduced self and explained role. Pt resides in [place] and prefers a clinic close to her home. Referral made to [facility] admissions this afternoon. Pt states she will need transportation assistance at d/c to HD appt. Spoke to RN CM regarding pt's need for transportation referral at d/c. Will assist as needed.   Melven Sartorius Renal Navigator [phone]

## 2021-12-20 DIAGNOSIS — K7031 Alcoholic cirrhosis of liver with ascites: Secondary | ICD-10-CM | POA: Diagnosis not present

## 2021-12-20 LAB — RENAL FUNCTION PANEL
Albumin: 2.5 g/dL — ABNORMAL LOW (ref 3.5–5.0)
Anion gap: 10 (ref 5–15)
BUN: 12 mg/dL (ref 8–23)
CO2: 25 mmol/L (ref 22–32)
Calcium: 8.1 mg/dL — ABNORMAL LOW (ref 8.9–10.3)
Chloride: 99 mmol/L (ref 98–111)
Creatinine, Ser: 2.79 mg/dL — ABNORMAL HIGH (ref 0.44–1.00)
GFR, Estimated: 18 mL/min — ABNORMAL LOW (ref >60)
Glucose, Bld: 96 mg/dL (ref 70–99)
Phosphorus: 2.8 mg/dL (ref 2.5–4.6)
Potassium: 4.3 mmol/L (ref 3.5–5.1)
Sodium: 134 mmol/L — ABNORMAL LOW (ref 135–145)

## 2021-12-20 LAB — CBC WITH DIFFERENTIAL/PLATELET
Abs Immature Granulocytes: 0.01 10*3/uL (ref 0.00–0.07)
Basophils Absolute: 0 10*3/uL (ref 0.0–0.1)
Basophils Relative: 1 %
Eosinophils Absolute: 0.1 10*3/uL (ref 0.0–0.5)
Eosinophils Relative: 4 %
HCT: 22.3 % — ABNORMAL LOW (ref 36.0–46.0)
Hemoglobin: 7.2 g/dL — ABNORMAL LOW (ref 12.0–15.0)
Immature Granulocytes: 0 %
Lymphocytes Relative: 26 %
Lymphs Abs: 0.9 10*3/uL (ref 0.7–4.0)
MCH: 31 pg (ref 26.0–34.0)
MCHC: 32.3 g/dL (ref 30.0–36.0)
MCV: 96.1 fL (ref 80.0–100.0)
Monocytes Absolute: 0.6 10*3/uL (ref 0.1–1.0)
Monocytes Relative: 18 %
Neutro Abs: 1.8 10*3/uL (ref 1.7–7.7)
Neutrophils Relative %: 51 %
Platelets: 73 10*3/uL — ABNORMAL LOW (ref 150–400)
RBC: 2.32 MIL/uL — ABNORMAL LOW (ref 3.87–5.11)
RDW: 13.2 % (ref 11.5–15.5)
WBC: 3.4 10*3/uL — ABNORMAL LOW (ref 4.0–10.5)
nRBC: 0 % (ref 0.0–0.2)

## 2021-12-20 LAB — PARATHYROID HORMONE, INTACT (NO CA)
PTH: 105 pg/mL — ABNORMAL HIGH (ref 15–65)
PTH: 156 pg/mL — ABNORMAL HIGH (ref 15–65)

## 2021-12-20 LAB — GLUCOSE, CAPILLARY
Glucose-Capillary: 107 mg/dL — ABNORMAL HIGH (ref 70–99)
Glucose-Capillary: 156 mg/dL — ABNORMAL HIGH (ref 70–99)
Glucose-Capillary: 102 mg/dL — ABNORMAL HIGH (ref 70–99)
Glucose-Capillary: 126 mg/dL — ABNORMAL HIGH (ref 70–99)

## 2021-12-20 LAB — AMMONIA: Ammonia: 39 umol/L — ABNORMAL HIGH (ref 9–35)

## 2021-12-20 MED ORDER — CHLORHEXIDINE GLUCONATE CLOTH 2 % EX PADS: 6.0000 | MEDICATED_PAD | Freq: Every day | CUTANEOUS | Status: DC

## 2021-12-20 NOTE — Progress Notes (Signed)
[facility] Progress Note   Subjective Final biopsy read discussed with [facility] on 6/19.  IgA nephropathy with no activity and no crescents. There is acute tubular injury in background on severe interstitial fibrosis 50%.  [place] [id number].    Her last HD was on 6/19 with 0.6 kg UF.   Zero mL is documented over 6/19.  The patient states that they just brought her a hat and she has 100 mL UOP there today.  Spoke with her via [place] interpreter and she asked a friend to listen in via phone as well.    Review of systems:  Denies n/v reports shortness of breath this AM - now resolved; denies chest pain     Objective Vitals:   12/19/21 1649 12/19/21 2126 12/20/21 0506 12/20/21 0949  BP: 137/65 116/64 122/66 132/60  Pulse: 79 77 85 86  Resp: 18 18 18 18   Temp: 99.1 F (37.3 C) 98.9 F (37.2 C) 98.4 F (36.9 C) 97.8 F (36.6 C)  TempSrc: Oral Oral Oral Oral  SpO2: 99% 100% 99% 100%  Weight:   64.2 kg   Height:       Physical Exam    General adult female in bed in no acute distress HEENT normocephalic atraumatic extraocular movements intact sclera anicteric Neck supple trachea midline Lungs clear to auscultation bilaterally normal work of breathing at rest on room air Heart S1S2 no rub Abdomen soft nontender nondistended Extremities no edema  Psych normal mood and affect Access: RIJ tunn catheter in place  Additional Objective Labs: Basic Metabolic Panel: Recent Labs  Lab 12/19/21 0425 12/19/21 0940 12/20/21 0347  NA 131* 132* 134*  K 3.8 3.8 4.3  CL 95* 96* 99  CO2 25 26 25   GLUCOSE 102* 159* 96  BUN 20 21 12   CREATININE 4.34* 4.19* 2.79*  CALCIUM 8.2* 8.3* 8.1*  PHOS  --  4.1 2.8   Liver Function Tests: Recent Labs  Lab 12/19/21 0940 12/20/21 0347  ALBUMIN 2.6* 2.5*   CBC: Recent Labs  Lab 12/17/21 0312 12/18/21 0311 12/19/21 0425 12/19/21 0940 12/20/21 0347  WBC 3.3* 3.7* 4.0 4.6 3.4*  NEUTROABS 1.8 2.1 2.2  --  1.8  HGB 7.1*  7.4* 7.2* 7.3* 7.2*  HCT 21.8* 22.8* 22.0* 23.2* 22.3*  MCV 94.0 94.6 94.8 96.7 96.1  PLT 81* 85* 87* 93* 73*   Blood Culture    Component Value Date/Time   SDES PERITONEAL 12/01/2021 2141   SDES PERITONEAL 12/01/2021 2141   SPECREQUEST NONE 12/01/2021 2141   SPECREQUEST NONE 12/01/2021 2141   CULT  12/01/2021 2141    NO GROWTH 5 DAYS Performed at [facility], [address], [place], [place] [address]    REPTSTATUS 12/06/2021 FINAL 12/01/2021 2141   REPTSTATUS 12/01/2021 FINAL 12/01/2021 2141   Studies/Results: No results found.  Medications:    amLODipine  5 mg Oral Daily   vitamin C  500 mg Oral Daily   Chlorhexidine Gluconate Cloth  6 each Topical Q0600   Chlorhexidine Gluconate Cloth  6 each Topical Q0600   darbepoetin (ARANESP) injection - DIALYSIS  40 mcg Intravenous Q Mon-HD   docusate sodium  100 mg Oral BID   folic acid  1 mg Oral Daily   heparin injection (subcutaneous)  5,000 Units Subcutaneous Q8H   hydrALAZINE  25 mg Oral Q8H   hydrocortisone   Topical TID AC & HS   insulin aspart  0-15 Units Subcutaneous TID WC   insulin aspart  0-5 Units Subcutaneous QHS   lactulose  30 g Oral TID   levothyroxine  25 mcg Oral Daily   melatonin  3 mg Oral Once   metoprolol succinate  50 mg Oral Daily   multivitamin with minerals  1 tablet Oral Daily   pantoprazole  40 mg Oral BID   thiamine  100 mg Oral Daily   zinc sulfate  220 mg Oral Daily   Initial work-up: Urinalysis > 50 RBCs, 0-5 WBCs, >300 proteinuria. Sediment with numerous dysmorphic RBCs, one RBC cast Upr/C 6.8g/g, Una 48, Ucr 86 RUS: 7.2cm and 7cm kidneys with ^ echogenicity, no obstruction ANA/ANCA/anti-GBM negative, normal C3/4, Hep B/C/HIV negative. + COVID  Dialysis Orders: Establishing.  Prelim Bx with aggressive IgA nephropathy and ATN, final pending.  Assessment/Plan: 1. AKI on CKD 4: Renal function had been declining over the past year. Baseline Cr 2's, with Cr 2.9 on admit. UA with  dysmorphic RBCs/RBC cast + heavy proteinuria. Serological w/u negative, as above. Renal Bx performed 6/13, prelim result = aggressive IgA nephropathy with chronic damage + some degree of ATN. Cr up to 5.1 on 6/16 with uremic symptoms.  [place] placed and HD #1 started on 12/16/21 with additional HD on 6/17 ------------------- - Plan for HD again on 6/21 2nd shift to allow me to see labs first.  Will assess dialysis needs daily.   - Have started CLIP process.  At this time she is AKI  - Based on her severe interstitial fibrosis there is no role for immunosuppression at this time for IgA - strict ins/outs  2. New Dx cirrhosis/portal HTN: GI was consulted. Hep panel negative. Diagnostic paracentesis neg for SBP. S/p liver Bx - unspecified cirrhosis. Urinary copper ^, low ceruloplasmin concerning for Wilson's, but unlikely per GI note - they feel c/w burned out NASH. increased ammonia, started on lactulose. Will need outpatient GI follow-up.  3. HTN urgency/volume: hypertension on admit - improved.  Due to hypotension I reduced amlodipine to 5 mg daily on 6/19 and placed hold parameters on hydralazine   4. Anemia of CKD: sat 6% on 12/03/21 -> s/p IV iron.  Aranesp 40 mcg weekly on Mondays started on 6/19.   5. Secondary hyperparathyroidism: Phos ok. No binders. intact PTH pending    6. COVID: Dx on admit (6/1) no significant hypoxia. S/p course of molnupiravir.  She is off of isolation per charting.  Per primary team   Disposition - please continue inpatient monitoring.    Claudia Desanctis, MD 12/20/2021 11:00 AM

## 2021-12-20 NOTE — Progress Notes (Signed)
PROGRESS NOTE  Stacey Chambers  LPF:[id number] DOB: 18-Nov-1950 DOA: 12/01/2021 PCP: Trey Sailors, PA   Brief Narrative:  Patient is a 71 year old female with history of chronic iron deficiency anemia, diabetes type 2, hypertension, hyperlipidemia, hypothyroidism, CKD stage IIIb, alcohol abuse who presented initially with lower extremity swelling, elevated blood pressure, fatigue ,dyspneic on ambulation.  After admission, she was found to have new onset liver cirrhosis with portal hypertension, splenomegaly, moderate volume ascites.  Also noted to have acute kidney injury on CKD.  COVID screen test was also positive.  GI was consulted.she underwent liver biopsy and renal biopsy during this hospitalization.  Nephrology also consulted for worsening kidney function and she was transferred to [facility] to be started on dialysis.  Underwent tunneled hemodialysis catheter placement by IR on 6/16, started on dialysis.  Now awaiting outpatient dialysis arrangement.  Prolonged hospitalization  Assessment & Plan:  Principal Problem:   Cirrhosis ([place]) Active Problems:   History of gastric ulcer   History of thrombocytopenia   History of iron deficiency anemia   Gastric ulcer   Dyspnea on exertion   Swelling of lower extremity   Acute kidney injury superimposed on CKD ([place])   Diabetes mellitus type 2 with complications (HCC)   Acute diastolic CHF (congestive heart failure) ([place])   2019 novel coronavirus disease (COVID-19)   Hypertensive urgency   Diabetes mellitus type 2, controlled, without complications ([place])   Acute renal failure superimposed on stage 3b chronic kidney disease (HCC)   Nephrotic syndrome   Normocytic anemia   Low serum albumin   Thrombocytopenia (HCC)   Liver disease, chronic, with cirrhosis ([place])   AKI on CKD stage IIIb/now ESRD: Progressive worsening the kidney function.  Baseline creatinine around 2.1 with gradual progression   Serology negative for antinuclear  antibody, ANCA.  Underwent renal biopsy.  Biopsy positive for IgA nephropathy.   Creatinine gradually worsened, developing uremic signs.  Nephrology recommended dialysis.  Transferred to [facility].  IR consulted and she underwent tunneled hemodialysis catheter placement 6/16.  Started dialysis.  Waiting for outpatient dialysis arrangement.  New onset decompensated liver cirrhosis/portal hypertension/splenomegaly/ascites: Acute hepatitis panel negative.  Diagnostic paracentesis was negative for SBP, fluid was transudative in nature.  Echo showed EF of 60 to 53%, grade 2 diastolic dysfunction.  No focal lesion on CT scan.  Alpha-fetoprotein normal, negative ANA, AMA, alpha antitrypsin antibody, no evidence of hemochromatosis.  Biopsy showed cirrhosis of uncertain etiology.  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  No indication of steroids.  She needs to follow-up with hepatology as an outpatient  Elevated ammonia level: Monitor.  Started on lactulose.  Titrate for 3-4 loose bowel movements a day   Hypertensive urgency: Continue current medications.  Monitor blood pressure.  Currently on hydralazine, amlodipine, metoprolol.  Blood pressure better  COVID illness: Screen test was positive.  No hypoxia.  Got molnupiravir.  Continue vitamin C, zinc.  Off isolation  History of gastric ulcer/GERD: Continue PPI  Diabetes type 2: A1c of 4.9.  Takes glipizide at home.  Currently on sliding scale  Hypothyroidism: Continue Synthyroid  Normocytic anemia: Likely associated with CKD.  Hemoglobin in the range of 7.  No evidence of acute blood loss  Thrombocytopenia: Most likely secondary to  cirrhosis.  Continue to monitor, stable  Goals of care: Multiple comorbidities with organ dysfunction/failure.  Palliative care was consulted.  Remains full code. Patient lives alone, no frequent contact with family.  Son is in [place]  DVT prophylaxis:heparin injection 5,000 Units Start: 12/13/21 2200 SCDs Start:  12/01/21 1826     Code Status: Full Code  Family Communication: None at the bedside  Patient status:Inpatient  Patient is from :Home  Anticipated discharge SW:NIOE  Estimated DC date: After nephrology clearance/outpatient dialysis arrangement   Consultants: GI, nephrology, palliative care  Procedures: Kidney biopsy, liver biopsy, paracentesis  Antimicrobials:  Anti-infectives (From admission, onward)    Start     Dose/Rate Route Frequency Ordered Stop   12/01/21 1900  molnupiravir EUA (LAGEVRIO) capsule 800 mg        4 capsule Oral 2 times daily 12/01/21 1546 12/06/21 0904       Subjective: Patient seen and examined at the bedside this morning.  She was comfortable, sitting in the chair, talking on phone.  [name] we do interpreter Collier Salina used for communication.  She does not have any complaints except for some discomfort on the dialysis catheter site.  Her main question was about outpatient dialysis center, transportation etc Objective: Vitals:   12/19/21 1649 12/19/21 2126 12/20/21 0506 12/20/21 0949  BP: 137/65 116/64 122/66 132/60  Pulse: 79 77 85 86  Resp: 18 18 18 18   Temp: 99.1 F (37.3 C) 98.9 F (37.2 C) 98.4 F (36.9 C) 97.8 F (36.6 C)  TempSrc: Oral Oral Oral Oral  SpO2: 99% 100% 99% 100%  Weight:   64.2 kg   Height:        Intake/Output Summary (Last 24 hours) at 12/20/2021 1144 Last data filed at 12/20/2021 0900 Gross per 24 hour  Intake 840 ml  Output 0 ml  Net 840 ml   Filed Weights   12/19/21 0901 12/19/21 1257 12/20/21 0506  Weight: 64.3 kg 64.1 kg 64.2 kg    Examination: General exam: Overall comfortable, not in distress,pleasant female HEENT: PERRL Respiratory system:  no wheezes or crackles  Cardiovascular system: S1 & S2 heard, RRR.  Dialysis catheter on the right chest Gastrointestinal system: Abdomen is nondistended, soft and nontender. Central nervous system: Alert and oriented Extremities: No edema, no clubbing ,no  cyanosis Skin: No rashes, no ulcers,no icterus       Data Reviewed: I have personally reviewed following labs and imaging studies  CBC: Recent Labs  Lab 12/16/21 0250 12/17/21 0312 12/18/21 0311 12/19/21 0425 12/19/21 0940 12/20/21 0347  WBC 4.6 3.3* 3.7* 4.0 4.6 3.4*  NEUTROABS 3.0 1.8 2.1 2.2  --  1.8  HGB 7.4* 7.1* 7.4* 7.2* 7.3* 7.2*  HCT 22.8* 21.8* 22.8* 22.0* 23.2* 22.3*  MCV 94.6 94.0 94.6 94.8 96.7 96.1  PLT 98* 81* 85* 87* 93* 73*   Basic Metabolic Panel: Recent Labs  Lab 12/17/21 0312 12/18/21 0311 12/19/21 0425 12/19/21 0940 12/20/21 0347  NA 131* 132* 131* 132* 134*  K 3.5 3.5 3.8 3.8 4.3  CL 96* 97* 95* 96* 99  CO2 25 26 25 26 25   GLUCOSE 96 101* 102* 159* 96  BUN 23 11 20 21 12   CREATININE 3.05* 2.61* 4.34* 4.19* 2.79*  CALCIUM 7.8* 8.2* 8.2* 8.3* 8.1*  PHOS  --   --   --  4.1 2.8     Recent Results (from the past 240 hour(s))  MRSA Next Gen by PCR, Nasal     Status: None   Collection Time: 12/17/21 12:27 PM   Specimen: Nasal Mucosa; Nasal Swab  Result Value Ref Range Status   MRSA by PCR Next Gen NOT DETECTED NOT DETECTED Final    Comment: (NOTE) The GeneXpert  MRSA Assay (FDA approved for NASAL specimens only), is one component of a comprehensive MRSA colonization surveillance program. It is not intended to diagnose MRSA infection nor to guide or monitor treatment for MRSA infections. Test performance is not FDA approved in patients less than 60 years old. Performed at [facility], [place] [address], [place], [place] [address]      Radiology Studies: No results found.  Scheduled Meds:  amLODipine  5 mg Oral Daily   vitamin C  500 mg Oral Daily   Chlorhexidine Gluconate Cloth  6 each Topical Q0600   Chlorhexidine Gluconate Cloth  6 each Topical Q0600   darbepoetin (ARANESP) injection - DIALYSIS  40 mcg Intravenous Q Mon-HD   docusate sodium  100 mg Oral BID   folic acid  1 mg Oral Daily   heparin injection (subcutaneous)  5,000  Units Subcutaneous Q8H   hydrALAZINE  25 mg Oral Q8H   hydrocortisone   Topical TID AC & HS   insulin aspart  0-15 Units Subcutaneous TID WC   insulin aspart  0-5 Units Subcutaneous QHS   lactulose  30 g Oral TID   levothyroxine  25 mcg Oral Daily   melatonin  3 mg Oral Once   metoprolol succinate  50 mg Oral Daily   multivitamin with minerals  1 tablet Oral Daily   pantoprazole  40 mg Oral BID   thiamine  100 mg Oral Daily   zinc sulfate  220 mg Oral Daily   Continuous Infusions:     LOS: 19 days   Shelly Coss, MD [facility] P6/20/2023, 11:44 AM

## 2021-12-20 NOTE — Progress Notes (Addendum)
Pt has been accepted at [facility] SW on TTS 6:40 am chair time. Pt will need to arrive at 6:00 for first appt to complete paperwork prior to treatment. Pt can start on Thursday if stable for d/c by then. Contacted [name] interpreter to request appt to meet with pt to discuss above details. Update provided to nephrologist and RN CM via secure chat. Will assist as needed.   Melven Sartorius Renal Navigator [phone]  Addendum at 2:33 pm: Met with pt at bedside to discuss out-pt HD arrangements with interprter, Graciela. Pt agreeable to plan and schedule letter provided. Pt voiced concerns about back pain while sitting for HD. Nephrologist made aware of this concern.

## 2021-12-21 DIAGNOSIS — K7031 Alcoholic cirrhosis of liver with ascites: Secondary | ICD-10-CM | POA: Diagnosis not present

## 2021-12-21 LAB — CBC WITH DIFFERENTIAL/PLATELET
Abs Immature Granulocytes: 0.01 10*3/uL (ref 0.00–0.07)
Basophils Absolute: 0.1 10*3/uL (ref 0.0–0.1)
Basophils Relative: 1 %
Eosinophils Absolute: 0.2 10*3/uL (ref 0.0–0.5)
Eosinophils Relative: 4 %
HCT: 25.2 % — ABNORMAL LOW (ref 36.0–46.0)
Hemoglobin: 8.1 g/dL — ABNORMAL LOW (ref 12.0–15.0)
Immature Granulocytes: 0 %
Lymphocytes Relative: 23 %
Lymphs Abs: 1 10*3/uL (ref 0.7–4.0)
MCH: 30.7 pg (ref 26.0–34.0)
MCHC: 32.1 g/dL (ref 30.0–36.0)
MCV: 95.5 fL (ref 80.0–100.0)
Monocytes Absolute: 0.6 10*3/uL (ref 0.1–1.0)
Monocytes Relative: 14 %
Neutro Abs: 2.5 10*3/uL (ref 1.7–7.7)
Neutrophils Relative %: 58 %
Platelets: 94 10*3/uL — ABNORMAL LOW (ref 150–400)
RBC: 2.64 MIL/uL — ABNORMAL LOW (ref 3.87–5.11)
RDW: 13.2 % (ref 11.5–15.5)
WBC: 4.3 10*3/uL (ref 4.0–10.5)
nRBC: 0 % (ref 0.0–0.2)

## 2021-12-21 LAB — RENAL FUNCTION PANEL
Albumin: 2.8 g/dL — ABNORMAL LOW (ref 3.5–5.0)
Anion gap: 10 (ref 5–15)
BUN: 18 mg/dL (ref 8–23)
CO2: 23 mmol/L (ref 22–32)
Calcium: 8.7 mg/dL — ABNORMAL LOW (ref 8.9–10.3)
Chloride: 101 mmol/L (ref 98–111)
Creatinine, Ser: 3.69 mg/dL — ABNORMAL HIGH (ref 0.44–1.00)
GFR, Estimated: 13 mL/min — ABNORMAL LOW (ref >60)
Glucose, Bld: 95 mg/dL (ref 70–99)
Phosphorus: 4.2 mg/dL (ref 2.5–4.6)
Potassium: 4.5 mmol/L (ref 3.5–5.1)
Sodium: 134 mmol/L — ABNORMAL LOW (ref 135–145)

## 2021-12-21 LAB — GLUCOSE, CAPILLARY
Glucose-Capillary: 118 mg/dL — ABNORMAL HIGH (ref 70–99)
Glucose-Capillary: 88 mg/dL (ref 70–99)
Glucose-Capillary: 113 mg/dL — ABNORMAL HIGH (ref 70–99)
Glucose-Capillary: 130 mg/dL — ABNORMAL HIGH (ref 70–99)

## 2021-12-21 MED ORDER — CHLORHEXIDINE GLUCONATE CLOTH 2 % EX PADS
6.0000 | MEDICATED_PAD | Freq: Every day | CUTANEOUS | Status: DC
Start: 2021-12-22 — End: 2021-12-28
  Administered 2021-12-22 – 2021-12-27 (×5): 6 via TOPICAL

## 2021-12-21 NOTE — Progress Notes (Signed)
PROGRESS NOTE  Stacey Chambers  WHQ:[id number] DOB: 12-26-1950 DOA: 12/01/2021 PCP: Trey Sailors, PA   Brief Narrative:  Patient is a 71 year old female with history of chronic iron deficiency anemia, diabetes type 2, hypertension, hyperlipidemia, hypothyroidism, CKD stage IIIb, alcohol abuse who presented initially with lower extremity swelling, elevated blood pressure, fatigue ,dyspneic on ambulation.  After admission, she was found to have new onset liver cirrhosis with portal hypertension, splenomegaly, moderate volume ascites.  Also noted to have acute kidney injury on CKD.  COVID screen test was also positive.  GI was consulted.she underwent liver biopsy and renal biopsy during this hospitalization.  Nephrology also consulted for worsening kidney function and she was transferred to [facility] to be started on dialysis.  Underwent tunneled hemodialysis catheter placement by IR on 6/16, started on dialysis.  Nephrology monitoring if she can be taken off from dialysis but also has arranged outpatient dialysis facility for her.  Discharge planning after nephrology clearance. Assessment & Plan:  Principal Problem:   Cirrhosis ([place]) Active Problems:   History of gastric ulcer   History of thrombocytopenia   History of iron deficiency anemia   Gastric ulcer   Dyspnea on exertion   Swelling of lower extremity   Acute kidney injury superimposed on CKD ([place])   Diabetes mellitus type 2 with complications (HCC)   Acute diastolic CHF (congestive heart failure) ([place])   2019 novel coronavirus disease (COVID-19)   Hypertensive urgency   Diabetes mellitus type 2, controlled, without complications ([place])   Acute renal failure superimposed on stage 3b chronic kidney disease (HCC)   Nephrotic syndrome   Normocytic anemia   Low serum albumin   Thrombocytopenia (HCC)   Liver disease, chronic, with cirrhosis ([place])   AKI on CKD stage IIIb/now ESRD: Progressive worsening the kidney function.   Baseline creatinine around 2.1 with gradual progression   Serology negative for antinuclear antibody, ANCA.  Underwent renal biopsy.  Biopsy positive for IgA nephropathy.   Creatinine gradually worsened, developing uremic signs.  Nephrology recommended dialysis.  Transferred to [facility].  IR consulted and she underwent tunneled hemodialysis catheter placement 6/16.  Started dialysis.    Nephrology monitoring if she can be taken off from dialysis but also has arranged outpatient dialysis facility for her.   New onset decompensated liver cirrhosis/portal hypertension/splenomegaly/ascites: Acute hepatitis panel negative.  Diagnostic paracentesis was negative for SBP, fluid was transudative in nature.  Echo showed EF of 60 to 65%, grade 2 diastolic dysfunction.  No focal lesion on CT scan.  Alpha-fetoprotein normal, negative ANA, AMA, alpha antitrypsin antibody, no evidence of hemochromatosis.  Biopsy showed cirrhosis of uncertain etiology.  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  No indication of steroids.  She needs to follow-up with hepatology/GI as an outpatient.  Her abdomen is not tense so no need for paracentesis for now  Elevated ammonia level: Monitor.  Started on lactulose.  Titrate for 3-4 loose bowel movements a day .  Continue current dose on discharge  Hypertensive urgency: Continue current medications.  Currently on hydralazine, amlodipine, metoprolol.  Blood pressure better  COVID illness: Screen test was positive.  No hypoxia.  Got molnupiravir.  Continue vitamin C, zinc.  Off isolation  History of gastric ulcer/GERD: Continue PPI  Diabetes type 2: A1c of 4.9.  Takes glipizide at home.  Currently on sliding scale.  Glipizide could be potentially discontinued on discharge  Hypothyroidism: Continue Synthyroid  Normocytic anemia: Likely associated with CKD.  Hemoglobin in the range of 7.  No  evidence of acute blood loss  Thrombocytopenia: Most likely secondary to  cirrhosis.  Continue to  monitor, stable  Goals of care: Multiple comorbidities with organ dysfunction/failure.  Palliative care was consulted.  Remains full code. Patient lives alone, no frequent contact with family.  Son is in [place]          DVT prophylaxis:heparin injection 5,000 Units Start: 12/13/21 2200 SCDs Start: 12/01/21 1826     Code Status: Full Code  Family Communication: None at the bedside  Patient status:Inpatient  Patient is from :Home  Anticipated discharge AL:PFXT  Estimated DC date: After nephrology clearance   Consultants: GI, nephrology, palliative care  Procedures: Kidney biopsy, liver biopsy, paracentesis  Antimicrobials:  Anti-infectives (From admission, onward)    Start     Dose/Rate Route Frequency Ordered Stop   12/01/21 1900  molnupiravir EUA (LAGEVRIO) capsule 800 mg        4 capsule Oral 2 times daily 12/01/21 1546 12/06/21 0904       Subjective:  Patient seen and examined the bedside this morning.  Hemodynamically stable.  Comfortable without any complaints today.  Objective: Vitals:   12/20/21 1642 12/20/21 2144 12/21/21 0650 12/21/21 0907  BP: (!) 104/47 119/61 (!) 131/56 140/67  Pulse: 76 74 71 73  Resp: 18 18 18 18   Temp: 99.1 F (37.3 C) 98.5 F (36.9 C) 98 F (36.7 C) 98.6 F (37 C)  TempSrc: Oral Oral  Oral  SpO2: 99% 100% 100% 100%  Weight:      Height:        Intake/Output Summary (Last 24 hours) at 12/21/2021 1134 Last data filed at 12/21/2021 1045 Gross per 24 hour  Intake 840 ml  Output 500 ml  Net 340 ml   Filed Weights   12/19/21 0901 12/19/21 1257 12/20/21 0506  Weight: 64.3 kg 64.1 kg 64.2 kg    Examination: General exam: Overall comfortable, not in distress HEENT: PERRL Respiratory system:  no wheezes or crackles  Cardiovascular system: S1 & S2 heard, RRR.  Dialysis catheter on the right chest Gastrointestinal system: Abdomen is distended but soft and nontender. Central nervous system: Alert and  oriented Extremities: No edema, no clubbing ,no cyanosis Skin: No rashes, no ulcers,no icterus     Data Reviewed: I have personally reviewed following labs and imaging studies  CBC: Recent Labs  Lab 12/17/21 0312 12/18/21 0311 12/19/21 0425 12/19/21 0940 12/20/21 0347 12/21/21 0715  WBC 3.3* 3.7* 4.0 4.6 3.4* 4.3  NEUTROABS 1.8 2.1 2.2  --  1.8 2.5  HGB 7.1* 7.4* 7.2* 7.3* 7.2* 8.1*  HCT 21.8* 22.8* 22.0* 23.2* 22.3* 25.2*  MCV 94.0 94.6 94.8 96.7 96.1 95.5  PLT 81* 85* 87* 93* 73* 94*   Basic Metabolic Panel: Recent Labs  Lab 12/18/21 0311 12/19/21 0425 12/19/21 0940 12/20/21 0347 12/21/21 0715  NA 132* 131* 132* 134* 134*  K 3.5 3.8 3.8 4.3 4.5  CL 97* 95* 96* 99 101  CO2 26 25 26 25 23   GLUCOSE 101* 102* 159* 96 95  BUN 11 20 21 12 18   CREATININE 2.61* 4.34* 4.19* 2.79* 3.69*  CALCIUM 8.2* 8.2* 8.3* 8.1* 8.7*  PHOS  --   --  4.1 2.8 4.2     Recent Results (from the past 240 hour(s))  MRSA Next Gen by PCR, Nasal     Status: None   Collection Time: 12/17/21 12:27 PM   Specimen: Nasal Mucosa; Nasal Swab  Result Value Ref Range Status   MRSA  by PCR Next Gen NOT DETECTED NOT DETECTED Final    Comment: (NOTE) The GeneXpert MRSA Assay (FDA approved for NASAL specimens only), is one component of a comprehensive MRSA colonization surveillance program. It is not intended to diagnose MRSA infection nor to guide or monitor treatment for MRSA infections. Test performance is not FDA approved in patients less than 45 years old. Performed at [facility], [place] [address] [place], [place] [address]      Radiology Studies: No results found.  Scheduled Meds:  amLODipine  5 mg Oral Daily   vitamin C  500 mg Oral Daily   Chlorhexidine Gluconate Cloth  6 each Topical Q0600   darbepoetin (ARANESP) injection - DIALYSIS  40 mcg Intravenous Q Mon-HD   docusate sodium  100 mg Oral BID   folic acid  1 mg Oral Daily   heparin injection (subcutaneous)  5,000 Units  Subcutaneous Q8H   hydrALAZINE  25 mg Oral Q8H   hydrocortisone   Topical TID AC & HS   insulin aspart  0-15 Units Subcutaneous TID WC   insulin aspart  0-5 Units Subcutaneous QHS   lactulose  30 g Oral TID   levothyroxine  25 mcg Oral Daily   melatonin  3 mg Oral Once   metoprolol succinate  50 mg Oral Daily   multivitamin with minerals  1 tablet Oral Daily   pantoprazole  40 mg Oral BID   thiamine  100 mg Oral Daily   zinc sulfate  220 mg Oral Daily   Continuous Infusions:     LOS: 20 days   Shelly Coss, MD [facility] P6/21/2023, 11:34 AM

## 2021-12-21 NOTE — Progress Notes (Addendum)
[facility] Progress Note   Subjective    Her last HD was on 6/19 with 0.6 kg UF.   Zero mL is documented over 6/20 (note that she had 100 mL UOP in her hat yesterday at the time of my am exam) so at least 100 mL.  I spoke with RN and she has contacted tech.   We initiated CLIP process earlier this week - she has a spot at [facility] on TTS schedule but she is not ready for discharge.   I spoke with the patient with the assistance of spanish video interpreter after a device was located on another floor as the patient's device is no longer in her room and had not been functional per staff.  Called earlier and no phone spanish interpreter available.   Review of systems:  Denies n/v reports shortness of breath yesterday - now resolved No chest pain     Objective Vitals:   12/20/21 1642 12/20/21 2144 12/21/21 0650 12/21/21 0907  BP: (!) 104/47 119/61 (!) 131/56 140/67  Pulse: 76 74 71 73  Resp: 18 18 18 18   Temp: 99.1 F (37.3 C) 98.5 F (36.9 C) 98 F (36.7 C) 98.6 F (37 C)  TempSrc: Oral Oral  Oral  SpO2: 99% 100% 100% 100%  Weight:      Height:       Physical Exam     General adult female in bed in no acute distress HEENT normocephalic atraumatic extraocular movements intact sclera anicteric Neck supple trachea midline Lungs clear to auscultation bilaterally normal work of breathing at rest on room air Heart S1S2 no rub Abdomen soft nontender nondistended Extremities no edema  Psych normal mood and affect Access: RIJ tunn catheter in place  Additional Objective Labs: Basic Metabolic Panel: Recent Labs  Lab 12/19/21 0940 12/20/21 0347 12/21/21 0715  NA 132* 134* 134*  K 3.8 4.3 4.5  CL 96* 99 101  CO2 26 25 23   GLUCOSE 159* 96 95  BUN 21 12 18   CREATININE 4.19* 2.79* 3.69*  CALCIUM 8.3* 8.1* 8.7*  PHOS 4.1 2.8 4.2   Liver Function Tests: Recent Labs  Lab 12/19/21 0940 12/20/21 0347 12/21/21 0715  ALBUMIN 2.6* 2.5* 2.8*    CBC: Recent Labs  Lab 12/18/21 0311 12/19/21 0425 12/19/21 0940 12/20/21 0347 12/21/21 0715  WBC 3.7* 4.0 4.6 3.4* 4.3  NEUTROABS 2.1 2.2  --  1.8 2.5  HGB 7.4* 7.2* 7.3* 7.2* 8.1*  HCT 22.8* 22.0* 23.2* 22.3* 25.2*  MCV 94.6 94.8 96.7 96.1 95.5  PLT 85* 87* 93* 73* 94*   Blood Culture    Component Value Date/Time   SDES PERITONEAL 12/01/2021 2141   SDES PERITONEAL 12/01/2021 2141   SPECREQUEST NONE 12/01/2021 2141   SPECREQUEST NONE 12/01/2021 2141   CULT  12/01/2021 2141    NO GROWTH 5 DAYS Performed at [facility], [address], [place], [place] [address]    REPTSTATUS 12/06/2021 FINAL 12/01/2021 2141   REPTSTATUS 12/01/2021 FINAL 12/01/2021 2141   Studies/Results: No results found.  Medications:    amLODipine  5 mg Oral Daily   vitamin C  500 mg Oral Daily   Chlorhexidine Gluconate Cloth  6 each Topical Q0600   darbepoetin (ARANESP) injection - DIALYSIS  40 mcg Intravenous Q Mon-HD   docusate sodium  100 mg Oral BID   folic acid  1 mg Oral Daily   heparin injection (subcutaneous)  5,000 Units Subcutaneous Q8H   hydrALAZINE  25  mg Oral Q8H   hydrocortisone   Topical TID AC & HS   insulin aspart  0-15 Units Subcutaneous TID WC   insulin aspart  0-5 Units Subcutaneous QHS   lactulose  30 g Oral TID   levothyroxine  25 mcg Oral Daily   melatonin  3 mg Oral Once   metoprolol succinate  50 mg Oral Daily   multivitamin with minerals  1 tablet Oral Daily   pantoprazole  40 mg Oral BID   thiamine  100 mg Oral Daily   zinc sulfate  220 mg Oral Daily   Initial work-up: Urinalysis > 50 RBCs, 0-5 WBCs, >300 proteinuria. Sediment with numerous dysmorphic RBCs, one RBC cast Upr/C 6.8g/g, Una 48, Ucr 86 RUS: 7.2cm and 7cm kidneys with ^ echogenicity, no obstruction ANA/ANCA/anti-GBM negative, normal C3/4, Hep B/C/HIV negative. + COVID  Final renal biopsy read discussed with [facility] labs on 6/19.  IgA nephropathy with no activity and no crescents. There is  acute tubular injury in background on severe interstitial fibrosis 50%.  [place] [id number].   Assessment/Plan: 1. AKI on CKD 4: Renal function had been declining over the past year. Baseline Cr 2's, with Cr 2.9 on admit. UA with dysmorphic RBCs/RBC cast + heavy proteinuria. Serological w/u negative, as above. Renal Bx performed 6/13, prelim result = aggressive IgA nephropathy with chronic damage + some degree of ATN. Cr up to 5.1 on 6/16 with uremic symptoms.  [place] placed and HD #1 started on 12/16/21  ------------------- - Hold HD on 6/21 - Plan for HD on 6/22.     - We have an outpatient dialysis unit and she would have a TTS schedule outpatient.  At this time she is AKI and we are watching for urine output - Based on her severe interstitial fibrosis there is no role for immunosuppression at this time for IgA - strict ins/outs are not available and this important to her care.  Discussed with nursing   2. New Dx cirrhosis/portal HTN: GI was consulted. Hep panel negative. Diagnostic paracentesis neg for SBP. S/p liver Bx - unspecified cirrhosis. Urinary copper ^, low ceruloplasmin concerning for Wilson's, but unlikely per GI note - they feel c/w burned out NASH. increased ammonia, started on lactulose. Will need outpatient GI follow-up.  3. HTN urgency/volume: hypertension on admit - improved.  Due to hypotension I reduced amlodipine to 5 mg daily on 6/19 and placed hold parameters on hydralazine   4. Anemia of CKD: sat 6% on 12/03/21 -> s/p IV iron.  Aranesp 40 mcg weekly on Mondays started on 6/19.   5. Secondary hyperparathyroidism: Phos ok. No binders. intact PTH 156 and 105 on two measurements on 6/19 - defer calcitriol for now  6. COVID: Dx on admit (6/1) no significant hypoxia. S/p course of molnupiravir.  She is off of isolation per charting.  Per primary team   Disposition - please continue inpatient monitoring.  She is not able to be discharged.  Assessing for recovery which has been  difficult without ins/outs  Claudia Desanctis, MD 12/21/2021 11:06 AM

## 2021-12-22 ENCOUNTER — Inpatient Hospital Stay (HOSPITAL_COMMUNITY): Payer: Medicare Other

## 2021-12-22 ENCOUNTER — Encounter (HOSPITAL_COMMUNITY): Payer: Self-pay

## 2021-12-22 DIAGNOSIS — N186 End stage renal disease: Secondary | ICD-10-CM | POA: Diagnosis not present

## 2021-12-22 DIAGNOSIS — N179 Acute kidney failure, unspecified: Secondary | ICD-10-CM | POA: Diagnosis not present

## 2021-12-22 DIAGNOSIS — N184 Chronic kidney disease, stage 4 (severe): Secondary | ICD-10-CM | POA: Diagnosis not present

## 2021-12-22 DIAGNOSIS — K7031 Alcoholic cirrhosis of liver with ascites: Secondary | ICD-10-CM | POA: Diagnosis not present

## 2021-12-22 LAB — GLUCOSE, CAPILLARY
Glucose-Capillary: 91 mg/dL (ref 70–99)
Glucose-Capillary: 98 mg/dL (ref 70–99)
Glucose-Capillary: 192 mg/dL — ABNORMAL HIGH (ref 70–99)

## 2021-12-22 LAB — RENAL FUNCTION PANEL
Albumin: 2.6 g/dL — ABNORMAL LOW (ref 3.5–5.0)
Anion gap: 11 (ref 5–15)
BUN: 24 mg/dL — ABNORMAL HIGH (ref 8–23)
CO2: 20 mmol/L — ABNORMAL LOW (ref 22–32)
Calcium: 8.4 mg/dL — ABNORMAL LOW (ref 8.9–10.3)
Chloride: 101 mmol/L (ref 98–111)
Creatinine, Ser: 4.33 mg/dL — ABNORMAL HIGH (ref 0.44–1.00)
GFR, Estimated: 11 mL/min — ABNORMAL LOW (ref >60)
Glucose, Bld: 116 mg/dL — ABNORMAL HIGH (ref 70–99)
Phosphorus: 4.1 mg/dL (ref 2.5–4.6)
Potassium: 4.6 mmol/L (ref 3.5–5.1)
Sodium: 132 mmol/L — ABNORMAL LOW (ref 135–145)

## 2021-12-22 LAB — CBC WITH DIFFERENTIAL/PLATELET
Abs Immature Granulocytes: 0.02 10*3/uL (ref 0.00–0.07)
Basophils Absolute: 0 10*3/uL (ref 0.0–0.1)
Basophils Relative: 1 %
Eosinophils Absolute: 0.2 10*3/uL (ref 0.0–0.5)
Eosinophils Relative: 4 %
HCT: 23.5 % — ABNORMAL LOW (ref 36.0–46.0)
Hemoglobin: 7.4 g/dL — ABNORMAL LOW (ref 12.0–15.0)
Immature Granulocytes: 1 %
Lymphocytes Relative: 22 %
Lymphs Abs: 1 10*3/uL (ref 0.7–4.0)
MCH: 30.1 pg (ref 26.0–34.0)
MCHC: 31.5 g/dL (ref 30.0–36.0)
MCV: 95.5 fL (ref 80.0–100.0)
Monocytes Absolute: 0.5 10*3/uL (ref 0.1–1.0)
Monocytes Relative: 12 %
Neutro Abs: 2.7 10*3/uL (ref 1.7–7.7)
Neutrophils Relative %: 60 %
Platelets: 102 10*3/uL — ABNORMAL LOW (ref 150–400)
RBC: 2.46 MIL/uL — ABNORMAL LOW (ref 3.87–5.11)
RDW: 13.2 % (ref 11.5–15.5)
WBC: 4.4 10*3/uL (ref 4.0–10.5)
nRBC: 0 % (ref 0.0–0.2)

## 2021-12-22 MED ORDER — DIPHENHYDRAMINE HCL 12.5 MG/5ML PO ELIX
12.5000 mg | ORAL_SOLUTION | Freq: Once | ORAL | Status: AC
Start: 2021-12-22 — End: 2021-12-26
  Administered 2021-12-26: 12.5 mg via ORAL
  Filled 2021-12-22 (×2): qty 5

## 2021-12-22 MED ORDER — HEPARIN SODIUM (PORCINE) 1000 UNIT/ML IJ SOLN
INTRAMUSCULAR | Status: AC
  Administered 2021-12-22: 1000 [IU]
  Filled 2021-12-22: qty 4

## 2021-12-22 NOTE — Progress Notes (Signed)
Pt c/o itching at permcath site. Spoke with Md and received order for Benadryl 12.5mg  po x1 dose. Md does not want patient too drowsy so that she may be able to speak with vascular for consultation.

## 2021-12-22 NOTE — Progress Notes (Signed)
Bilateral UE vein mapping duplex study completed. Please see CV Proc for preliminary results.  Anderson Malta  Samyra Limb BS, RVT 12/22/2021 12:14 PM

## 2021-12-22 NOTE — Progress Notes (Signed)
PROGRESS NOTE  Stacey Chambers  UUE:[id number] DOB: 12/01/1950 DOA: 12/01/2021 PCP: Trey Sailors, PA   Brief Narrative:  Patient is a 71 year old female with history of chronic iron deficiency anemia, diabetes type 2, hypertension, hyperlipidemia, hypothyroidism, CKD stage IIIb, alcohol abuse who presented initially with lower extremity swelling, elevated blood pressure, fatigue ,dyspneic on ambulation.  After admission, she was found to have new onset liver cirrhosis with portal hypertension, splenomegaly, moderate volume ascites.  Also noted to have acute kidney injury on CKD.  COVID screen test was also positive.  GI was consulted.she underwent liver biopsy and renal biopsy during this hospitalization.  Nephrology also consulted for worsening kidney function and she was transferred to [facility] to be started on dialysis.  Underwent tunneled hemodialysis catheter placement by IR on 6/16, started on dialysis.vascular surgery consulted today for placement of permanent dialysis access.   Discharge planning after nephrology clearance. Assessment & Plan:  Principal Problem:   Cirrhosis ([place]) Active Problems:   History of gastric ulcer   History of thrombocytopenia   History of iron deficiency anemia   Gastric ulcer   Dyspnea on exertion   Swelling of lower extremity   Acute kidney injury superimposed on CKD ([place])   Diabetes mellitus type 2 with complications (HCC)   Acute diastolic CHF (congestive heart failure) ([place])   2019 novel coronavirus disease (COVID-19)   Hypertensive urgency   Diabetes mellitus type 2, controlled, without complications ([place])   Acute renal failure superimposed on stage 3b chronic kidney disease (HCC)   Nephrotic syndrome   Normocytic anemia   Low serum albumin   Thrombocytopenia (HCC)   Liver disease, chronic, with cirrhosis ([place])   AKI on CKD stage IIIb/now ESRD: Progressive worsening the kidney function.  Baseline creatinine around 2.1 with  gradual progression   Serology negative for antinuclear antibody, ANCA.  Underwent renal biopsy.  Biopsy positive for IgA nephropathy.   Creatinine gradually worsened, developing uremic signs.  Nephrology recommended dialysis.  Transferred to [facility].  IR consulted and she underwent tunneled hemodialysis catheter placement 6/16.  Started dialysis.    Outpatient dialysis facility has been arranged.  Vascular surgery consulted today for placement of permanent dialysis access  New onset decompensated liver cirrhosis/portal hypertension/splenomegaly/ascites: Acute hepatitis panel negative.  Diagnostic paracentesis was negative for SBP, fluid was transudative in nature.  Echo showed EF of 60 to 91%, grade 2 diastolic dysfunction.  No focal lesion on CT scan.  Alpha-fetoprotein normal, negative ANA, AMA, alpha antitrypsin antibody, no evidence of hemochromatosis.  Biopsy showed cirrhosis of uncertain etiology.  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  No indication of steroids.  She needs to follow-up with hepatology/GI as an outpatient.  Her abdomen is not tense so no need for paracentesis for now  Elevated ammonia level: Monitor.  Started on lactulose.  Titrate for 3-4 loose bowel movements a day .  Continue current dose on discharge  Hypertensive urgency: Continue current medications.  Currently on hydralazine, amlodipine, metoprolol.  Blood pressure better  COVID illness: Screen test was positive.  No hypoxia.  Got molnupiravir.  Continue vitamin C, zinc.  Off isolation  History of gastric ulcer/GERD: Continue PPI  Diabetes type 2: A1c of 4.9.  Takes glipizide at home.  Currently on sliding scale.  Glipizide could be potentially discontinued on discharge  Hypothyroidism: Continue Synthyroid  Normocytic anemia: Likely associated with CKD.  Hemoglobin in the range of 7.  No evidence of acute blood loss  Thrombocytopenia: Most likely secondary to  cirrhosis.  Continue to monitor, stable  Goals of care:  Multiple comorbidities with organ dysfunction/failure.  Palliative care was consulted.  Remains full code. Patient lives alone, no frequent contact with family.  Son is in [place]          DVT prophylaxis:heparin injection 5,000 Units Start: 12/13/21 2200 SCDs Start: 12/01/21 1826     Code Status: Full Code  Family Communication: None at the bedside  Patient status:Inpatient  Patient is from :Home  Anticipated discharge MH:DQQI  Estimated DC date: After nephrology clearance   Consultants: GI, nephrology, palliative care  Procedures: Kidney biopsy, liver biopsy, paracentesis  Antimicrobials:  Anti-infectives (From admission, onward)    Start     Dose/Rate Route Frequency Ordered Stop   12/01/21 1900  molnupiravir EUA (LAGEVRIO) capsule 800 mg        4 capsule Oral 2 times daily 12/01/21 1546 12/06/21 0904       Subjective:  Patient seen and examined at the bedside this morning.  Comfortable.  Sitting in the chair.  No complaints today.  Objective: Vitals:   12/22/21 0608 12/22/21 0609 12/22/21 0610 12/22/21 0920  BP: (!) 133/119  (!) 123/58 (!) 121/58  Pulse: 83 71 72 73  Resp:   18 18  Temp: 98.4 F (36.9 C) 98 F (36.7 C) 98 F (36.7 C) 98 F (36.7 C)  TempSrc: Oral  Oral Oral  SpO2: 100% 100% 99% 98%  Weight:   64 kg   Height:        Intake/Output Summary (Last 24 hours) at 12/22/2021 1137 Last data filed at 12/22/2021 2979 Gross per 24 hour  Intake 956 ml  Output 600 ml  Net 356 ml   Filed Weights   12/19/21 1257 12/20/21 0506 12/22/21 0610  Weight: 64.1 kg 64.2 kg 64 kg    Examination: General exam: Overall comfortable, not in distress HEENT: PERRL Respiratory system:  no wheezes or crackles  Cardiovascular system: S1 & S2 heard, RRR.  Dialysis catheter in the right chest Gastrointestinal system: Abdomen is distended, soft and nontender. Central nervous system: Alert and oriented Extremities: No edema, no clubbing ,no cyanosis Skin:  No rashes, no ulcers,no icterus       Data Reviewed: I have personally reviewed following labs and imaging studies  CBC: Recent Labs  Lab 12/18/21 0311 12/19/21 0425 12/19/21 0940 12/20/21 0347 12/21/21 0715 12/22/21 0401  WBC 3.7* 4.0 4.6 3.4* 4.3 4.4  NEUTROABS 2.1 2.2  --  1.8 2.5 2.7  HGB 7.4* 7.2* 7.3* 7.2* 8.1* 7.4*  HCT 22.8* 22.0* 23.2* 22.3* 25.2* 23.5*  MCV 94.6 94.8 96.7 96.1 95.5 95.5  PLT 85* 87* 93* 73* 94* 892*   Basic Metabolic Panel: Recent Labs  Lab 12/19/21 0425 12/19/21 0940 12/20/21 0347 12/21/21 0715 12/22/21 0401  NA 131* 132* 134* 134* 132*  K 3.8 3.8 4.3 4.5 4.6  CL 95* 96* 99 101 101  CO2 25 26 25 23  20*  GLUCOSE 102* 159* 96 95 116*  BUN 20 21 12 18  24*  CREATININE 4.34* 4.19* 2.79* 3.69* 4.33*  CALCIUM 8.2* 8.3* 8.1* 8.7* 8.4*  PHOS  --  4.1 2.8 4.2 4.1     Recent Results (from the past 240 hour(s))  MRSA Next Gen by PCR, Nasal     Status: None   Collection Time: 12/17/21 12:27 PM   Specimen: Nasal Mucosa; Nasal Swab  Result Value Ref Range Status   MRSA by PCR Next Gen NOT DETECTED NOT DETECTED Final  Comment: (NOTE) The GeneXpert MRSA Assay (FDA approved for NASAL specimens only), is one component of a comprehensive MRSA colonization surveillance program. It is not intended to diagnose MRSA infection nor to guide or monitor treatment for MRSA infections. Test performance is not FDA approved in patients less than 86 years old. Performed at [facility], [place] [address], [place], [place] [address]      Radiology Studies: No results found.  Scheduled Meds:  amLODipine  5 mg Oral Daily   vitamin C  500 mg Oral Daily   Chlorhexidine Gluconate Cloth  6 each Topical Q0600   darbepoetin (ARANESP) injection - DIALYSIS  40 mcg Intravenous Q Mon-HD   docusate sodium  100 mg Oral BID   folic acid  1 mg Oral Daily   heparin injection (subcutaneous)  5,000 Units Subcutaneous Q8H   hydrALAZINE  25 mg Oral Q8H   hydrocortisone    Topical TID AC & HS   insulin aspart  0-15 Units Subcutaneous TID WC   insulin aspart  0-5 Units Subcutaneous QHS   lactulose  30 g Oral TID   levothyroxine  25 mcg Oral Daily   melatonin  3 mg Oral Once   metoprolol succinate  50 mg Oral Daily   multivitamin with minerals  1 tablet Oral Daily   pantoprazole  40 mg Oral BID   thiamine  100 mg Oral Daily   zinc sulfate  220 mg Oral Daily   Continuous Infusions:     LOS: 21 days   Shelly Coss, MD [facility] P6/22/2023, 11:37 AM

## 2021-12-22 NOTE — Progress Notes (Signed)
Brief Nutrition Note  Pt out of room in vascular lab at first attempt at education and in HD at second attempt. Will follow-up tomorrow for diet teaching for new HD pt. Noted that pt is planned to have permanent HD access placed tomorrow by surgery team.   Ranell Patrick, RD, LDN Clinical Dietitian RD pager # available in [place]  After hours/weekend pager # available in [facility]

## 2021-12-22 NOTE — Anesthesia Preprocedure Evaluation (Signed)
Anesthesia Evaluation    Reviewed: Allergy & Precautions, Patient's Chart, lab work & pertinent test results  History of Anesthesia Complications Negative for: history of anesthetic complications  Airway        Dental   Pulmonary neg pulmonary ROS,           Cardiovascular hypertension, Pt. on medications and Pt. on home beta blockers +CHF     Echo 12/02/21: EF 60-65%, no RWMA, g2dd, mild pulm HTN, no MR, trivial AR   Neuro/Psych negative neurological ROS     GI/Hepatic PUD, (+) Cirrhosis       ,   Endo/Other  diabetesHypothyroidism   Renal/GU ESRFRenal disease  negative genitourinary   Musculoskeletal negative musculoskeletal ROS (+)   Abdominal   Peds  Hematology  (+) Blood dyscrasia, anemia ,   Anesthesia Other Findings   Reproductive/Obstetrics                             Anesthesia Physical Anesthesia Plan  ASA: 3  Anesthesia Plan: MAC and Regional   Post-op Pain Management: Regional block*   Induction: Intravenous  PONV Risk Score and Plan: 2 and Propofol infusion, TIVA and Treatment may vary due to age or medical condition  Airway Management Planned: Natural Airway, Nasal Cannula and Simple Face Mask  Additional Equipment: None  Intra-op Plan:   Post-operative Plan:   Informed Consent:   Plan Discussed with:   Anesthesia Plan Comments:         Anesthesia Quick Evaluation

## 2021-12-22 NOTE — Progress Notes (Signed)
Provider filled out paperwork for patient family member.  Gave completed paperwork to patient.

## 2021-12-22 NOTE — TOC Progression Note (Signed)
Transition of Care [facility]) - Initial/Assessment Note    Patient Details  Name: Stacey Chambers MRN: [id number] Date of Birth: 09-09-1950  Transition of [facility]) CM/SW Contact:    Milinda Antis, LCSWA Phone Number: 12/22/2021, 12:30 PM  Clinical Narrative:                 CSW was given FLMA forms to be completed for the patient's daughter.  MD notified.    Expected Discharge Plan: Home/Self Care Barriers to Discharge: No Barriers Identified   Patient Goals and CMS Choice Patient states their goals for this hospitalization and ongoing recovery are:: To feel better CMS Medicare.gov Compare Post Acute Care list provided to:: Patient Choice offered to / list presented to : Patient  Expected Discharge Plan and Services Expected Discharge Plan: Home/Self Care   Discharge Planning Services: CM Consult   Living arrangements for the past 2 months: Single Family Home                                      Prior Living Arrangements/Services Living arrangements for the past 2 months: Single Family Home Lives with:: Self (friend is with her during the day) Patient language and need for interpreter reviewed:: No Do you feel safe going back to the place where you live?: Yes        Care giver support system in place?: Yes (comment) (friend and [place])      Activities of Daily Living Home Assistive Devices/Equipment: Dentures (specify type), Eyeglasses (pt c/o dentures not fitting and hard to eat, upper right partial that she glues on gum, bottoms one falls off) ADL Screening (condition at time of admission) Patient's cognitive ability adequate to safely complete daily activities?: Yes Is the patient deaf or have difficulty hearing?: No Does the patient have difficulty seeing, even when wearing glasses/contacts?: Yes (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) Does the patient have difficulty concentrating, remembering, or making decisions?: No Patient able to express  need for assistance with ADLs?: Yes Does the patient have difficulty dressing or bathing?: Yes Independently performs ADLs?: Yes (appropriate for developmental age) Does the patient have difficulty walking or climbing stairs?: Yes Weakness of Legs: Both (legs are swollen per pt) Weakness of Arms/Hands: Both  Permission Sought/Granted Permission sought to share information with : Case Manager                Emotional Assessment Appearance:: Appears stated age     Orientation: : Oriented to Self, Oriented to Place, Oriented to  Time, Oriented to Situation      Admission diagnosis:  Cirrhosis ([place]) [K74.60] Liver disease [K76.9] Primary hypertension [I10] Thrombocytopenia ([place]) [D69.6] Hypervolemia, unspecified hypervolemia type [E87.70] Patient Active Problem List   Diagnosis Date Noted   Liver disease, chronic, with cirrhosis ([place]) 03/54/6568   Acute diastolic CHF (congestive heart failure) ([place]) 12/10/2021   2019 novel coronavirus disease (COVID-19) 12/10/2021   Hypertensive urgency 12/10/2021   Diabetes mellitus type 2, controlled, without complications ([place]) 12/75/1700   Acute renal failure superimposed on stage 3b chronic kidney disease ([place]) 12/10/2021   Nephrotic syndrome 12/10/2021   Normocytic anemia 12/10/2021   Low serum albumin 12/10/2021   Thrombocytopenia ([place]) 12/10/2021   Cirrhosis ([place]) 12/01/2021   Dyspnea on exertion 12/01/2021   Swelling of lower extremity 12/01/2021   Acute kidney injury superimposed on CKD ([place]) 12/01/2021   Diabetes mellitus type  2 with complications ([place]) 90/21/1155   Abnormal LFTs 04/29/2021   Iron deficiency 04/29/2021   Gastric ulcer 04/29/2021   Splenic lesion 04/29/2021   Abdominal pain, epigastric 03/14/2021   History of gastric ulcer 03/14/2021   Elevated lipase 03/14/2021   Elevated LFTs 03/14/2021   History of thrombocytopenia 03/14/2021   History of iron deficiency anemia 03/14/2021   Macrocytosis 03/14/2021    PCP:  Trey Sailors, PA Pharmacy:   [id number] - [place], [place] [address] [place] [address] Phone: [phone] Fax: [place]. [place] [address] Phone: [phone] Fax: [phone]     Social Determinants of Health (SDOH) Interventions    Readmission Risk Interventions     No data to display

## 2021-12-22 NOTE — Progress Notes (Incomplete)
Nutrition Education Note  RD consulted for Renal Education. Provided NIH handout "Eating Right for Chronic Kidney Disease" in [name]. Reviewed food groups and recommended serving sizes for patient's current nutritional status.   Explained why diet restrictions are needed and provided lists of foods to limit/avoid that are high potassium, sodium, and phosphorus. Provided specific recommendations on safer alternatives of these foods. Strongly encouraged compliance of this diet.   Discussed importance of protein intake at each meal and snack. Provided examples of how to maximize protein intake throughout the day. Discussed need for fluid restriction with dialysis, importance of minimizing weight gain between HD treatments, and renal-friendly beverage options.  Encouraged pt to discuss specific diet questions/concerns with RD at HD outpatient facility. Teach back method used.  Expect *** compliance.  Body mass index is 29.49 kg/m. Pt meets criteria for overweight based on current BMI.  Current diet order is ***, patient s consuming approximately ***% of meals at this time. Labs and medications reviewed. No further nutrition interventions warranted at this time. RD contact information provided. If additional nutrition issues arise, please re-consult RD.  Stacey Chambers, RD, LDN Clinical Dietitian RD pager # available in [place]  After hours/weekend pager # available in [facility]

## 2021-12-22 NOTE — Consult Note (Signed)
Hospital Consult    Reason for Consult:  permanent dialysis access Requesting Physician:  Dr. Royce Macadamia MRN #:  [id number]  History of Present Illness: This is a 71 y.o. female with AKI on CKD stage 4 who presented with worsening lower extremity swelling, fatigue and elevated blood pressure. Her renal function has been slowly declining over past year. On this admission she is felt to be approaching ESRD. She has had a right IJ TDC place by IR and initiated dialysis on 6/16. I used the video interpreter to assist in obtaining patient history. She has never been on dialysis before this admission. No history of central line or IVs. No pacemaker. She has no upper extremity limitations. She is right hand dominant. Vascular surgery has been consulted for placement of permanent dialysis access during this admission.   Past Medical History:  Diagnosis Date   Anemia    Diabetes mellitus without complication ([place])    Hypertension    Vision loss     Past Surgical History:  Procedure Laterality Date   BACK SURGERY     COLONOSCOPY     IR FLUORO GUIDE CV LINE RIGHT  12/16/2021   IR US GUIDE VASC ACCESS RIGHT  12/16/2021   UPPER GASTROINTESTINAL ENDOSCOPY      No Known Allergies  Prior to Admission medications   Medication Sig Start Date End Date Taking? Authorizing Provider  amLODipine (NORVASC) 2.5 MG tablet Take 2.5 mg by mouth daily. 11/25/21  Yes [provider]  atorvastatin (LIPITOR) 20 MG tablet Take 20 mg by mouth daily. 11/25/21  Yes [provider]  esomeprazole (NEXIUM) 40 MG capsule Take 1 capsule (40 mg total) by mouth 2 (two) times daily. 09/13/21  Yes Mansouraty, Telford Nab., MD  ferrous gluconate (FERGON) 324 MG tablet Take 1 tablet (324 mg total) by mouth daily with breakfast. 09/13/21  Yes Mansouraty, Telford Nab., MD  fluticasone [facility]) 50 MCG/ACT nasal spray Place 1 spray into both nostrils daily as needed for allergies. 10/20/21  Yes [provider]   glipiZIDE (GLUCOTROL) 5 MG tablet Take 2.5 mg by mouth daily. 03/10/21  Yes [provider]  levothyroxine (SYNTHROID) 25 MCG tablet Take 25 mcg by mouth daily. 11/17/21  Yes [provider]  metoprolol succinate (TOPROL-XL) 50 MG 24 hr tablet Take 50 mg by mouth daily. 10/20/21  Yes [provider]  Vitamin D, Ergocalciferol, (DRISDOL) 1.25 MG (50000 UNIT) CAPS capsule Take 50,000 Units by mouth once a week. Wednesday 10/25/21  Yes [provider]  ACCU-CHEK GUIDE test strip  02/01/21   [provider]  Accu-Chek Softclix Lancets lancets SMARTSIG:Topical 01/31/21   [provider]    Social History   Socioeconomic History   Marital status: Married    Spouse name: Not on file   Number of children: Not on file   Years of education: Not on file   Highest education level: Not on file  Occupational History   Not on file  Tobacco Use   Smoking status: Never   Smokeless tobacco: Never  Vaping Use   Vaping Use: Never used  Substance and Sexual Activity   Alcohol use: Never    Comment: once in awhile   Drug use: Not Currently    Types: Marijuana    Comment: Use of THC tea 1-2 times per week   Sexual activity: Not on file  Other Topics Concern   Not on file  Social History Narrative   Not on file   Social  Determinants of Health   Financial Resource Strain: Not on file  Food Insecurity: Not on file  Transportation Needs: Not on file  Physical Activity: Not on file  Stress: Not on file  Social Connections: Not on file  Intimate Partner Violence: Not on file     Family History  Problem Relation Age of Onset   Diabetes Brother    Colon cancer Neg Hx    Esophageal cancer Neg Hx    Stomach cancer Neg Hx    Pancreatic cancer Neg Hx    Inflammatory bowel disease Neg Hx    Liver disease Neg Hx    Rectal cancer Neg Hx    Colon polyps Neg Hx     ROS: Otherwise negative unless mentioned in HPI  Physical Examination  Vitals:    12/22/21 0610 12/22/21 0920  BP: (!) 123/58 (!) 121/58  Pulse: 72 73  Resp: 18 18  Temp: 98 F (36.7 C) 98 F (36.7 C)  SpO2: 99% 98%   Body mass index is 29.49 kg/m.  General:  WDWN in NAD; very pleasant spanish speaking female sitting up on side of the bed Gait: Not observed HENT: WNL, normocephalic Pulmonary: normal non-labored breathing, without wheezing Cardiac: regular Vascular Exam/Pulses: 2+ radial pulses bilaterally, hands are warm and well perfused. 5/5 grip strength bilaterally. She has numerous areas of ecchymosis on both forearms and ACs from IV sticks Musculoskeletal: no muscle wasting or atrophy  Neurologic: A&O X 3;  No focal weakness or paresthesias are detected; speech is fluent/normal Psychiatric:  The pt has Normal affect.  CBC    Component Value Date/Time   WBC 4.4 12/22/2021 0401   RBC 2.46 (L) 12/22/2021 0401   HGB 7.4 (L) 12/22/2021 0401   HCT 23.5 (L) 12/22/2021 0401   PLT 102 (L) 12/22/2021 0401   MCV 95.5 12/22/2021 0401   MCH 30.1 12/22/2021 0401   MCHC 31.5 12/22/2021 0401   RDW 13.2 12/22/2021 0401   LYMPHSABS 1.0 12/22/2021 0401   MONOABS 0.5 12/22/2021 0401   EOSABS 0.2 12/22/2021 0401   BASOSABS 0.0 12/22/2021 0401    BMET    Component Value Date/Time   NA 132 (L) 12/22/2021 0401   K 4.6 12/22/2021 0401   CL 101 12/22/2021 0401   CO2 20 (L) 12/22/2021 0401   GLUCOSE 116 (H) 12/22/2021 0401   BUN 24 (H) 12/22/2021 0401   CREATININE 4.33 (H) 12/22/2021 0401   CALCIUM 8.4 (L) 12/22/2021 0401   GFRNONAA 11 (L) 12/22/2021 0401    COAGS: Lab Results  Component Value Date   INR 1.3 (H) 12/10/2021   INR 1.4 (H) 12/08/2021   INR 1.4 (H) 12/01/2021     Non-Invasive Vascular Imaging:   Pending bilateral upper extremity vein mapping  Statin:  Yes.   Beta Blocker:  Yes Aspirin:  No. ACEI:  No. ARB:  No. CCB use:  Yes Other antiplatelets/anticoagulants:  No.    ASSESSMENT/PLAN: This is a 71 y.o. female who presents with AKI  on CKD stage 4 now felt to be approaching ESRD. We have been consulted for permanent access placement.  Her upper extremity vein mapping is pending. Dr. Royce Macadamia would like her to have access placed during this admission.She has already eaten today so will not be able to go to OR today.  She is NPO after midnight. Plan will be for left upper extremity AVF vs AVG. We will plan to get her added to the OR schedule tomorrow 12/23/21. The on call  vascular surgeon, Dr. Stanford Breed will see patient later today to provide further details regarding surgery.   Karoline Caldwell PA-C [facility] [phone] 12/22/2021  10:33 AM

## 2021-12-22 NOTE — Progress Notes (Signed)
   Time tx completed:1850  HD treatment completed. Patient tolerated well. Fistula/Graft/HD catheter without signs and symptoms of complications. Patient transported back to the room, alert and orient and in no acute distress. Report given to bedside RN.  Total UF removed:1500  Medication given:  Post HD VS:VS stable, see flow sheet.  Post HD weight: 60.2

## 2021-12-22 NOTE — Plan of Care (Signed)
  Problem: Clinical Measurements: Goal: Respiratory complications will improve Outcome: Progressing Goal: Cardiovascular complication will be avoided Outcome: Progressing   Problem: Activity: Goal: Risk for activity intolerance will decrease Outcome: Progressing   Problem: Nutrition: Goal: Adequate nutrition will be maintained Outcome: Progressing   

## 2021-12-23 ENCOUNTER — Other Ambulatory Visit: Payer: Self-pay

## 2021-12-23 ENCOUNTER — Encounter (HOSPITAL_COMMUNITY)
Admission: EM | Disposition: A | Discharge status: Home / Self Care | Payer: Self-pay | Source: Home / Self Care | Attending: Internal Medicine

## 2021-12-23 ENCOUNTER — Inpatient Hospital Stay (HOSPITAL_COMMUNITY): Payer: Medicare Other | Admitting: Anesthesiology

## 2021-12-23 ENCOUNTER — Encounter (HOSPITAL_COMMUNITY): Payer: Self-pay | Admitting: Internal Medicine

## 2021-12-23 DIAGNOSIS — N184 Chronic kidney disease, stage 4 (severe): Secondary | ICD-10-CM | POA: Diagnosis not present

## 2021-12-23 DIAGNOSIS — I130 Hypertensive heart and chronic kidney disease with heart failure and stage 1 through stage 4 chronic kidney disease, or unspecified chronic kidney disease: Secondary | ICD-10-CM

## 2021-12-23 DIAGNOSIS — I509 Heart failure, unspecified: Secondary | ICD-10-CM

## 2021-12-23 DIAGNOSIS — E1122 Type 2 diabetes mellitus with diabetic chronic kidney disease: Secondary | ICD-10-CM | POA: Diagnosis not present

## 2021-12-23 DIAGNOSIS — K7031 Alcoholic cirrhosis of liver with ascites: Secondary | ICD-10-CM | POA: Diagnosis not present

## 2021-12-23 DIAGNOSIS — Z992 Dependence on renal dialysis: Secondary | ICD-10-CM

## 2021-12-23 DIAGNOSIS — N179 Acute kidney failure, unspecified: Secondary | ICD-10-CM | POA: Diagnosis not present

## 2021-12-23 HISTORY — PX: AV FISTULA PLACEMENT: SHX1204

## 2021-12-23 LAB — CBC WITH DIFFERENTIAL/PLATELET
Abs Immature Granulocytes: 0 10*3/uL (ref 0.00–0.07)
Basophils Absolute: 0 10*3/uL (ref 0.0–0.1)
Basophils Relative: 1 %
Eosinophils Absolute: 0.2 10*3/uL (ref 0.0–0.5)
Eosinophils Relative: 5 %
HCT: 20.6 % — ABNORMAL LOW (ref 36.0–46.0)
Hemoglobin: 6.6 g/dL — CL (ref 12.0–15.0)
Immature Granulocytes: 0 %
Lymphocytes Relative: 24 %
Lymphs Abs: 0.9 10*3/uL (ref 0.7–4.0)
MCH: 30.1 pg (ref 26.0–34.0)
MCHC: 32 g/dL (ref 30.0–36.0)
MCV: 94.1 fL (ref 80.0–100.0)
Monocytes Absolute: 0.6 10*3/uL (ref 0.1–1.0)
Monocytes Relative: 16 %
Neutro Abs: 2.1 10*3/uL (ref 1.7–7.7)
Neutrophils Relative %: 54 %
Platelets: 77 10*3/uL — ABNORMAL LOW (ref 150–400)
RBC: 2.19 MIL/uL — ABNORMAL LOW (ref 3.87–5.11)
RDW: 13.2 % (ref 11.5–15.5)
WBC: 3.9 10*3/uL — ABNORMAL LOW (ref 4.0–10.5)
nRBC: 0 % (ref 0.0–0.2)

## 2021-12-23 LAB — RENAL FUNCTION PANEL
Albumin: 2.3 g/dL — ABNORMAL LOW (ref 3.5–5.0)
Anion gap: 9 (ref 5–15)
BUN: 11 mg/dL (ref 8–23)
CO2: 25 mmol/L (ref 22–32)
Calcium: 7.9 mg/dL — ABNORMAL LOW (ref 8.9–10.3)
Chloride: 98 mmol/L (ref 98–111)
Creatinine, Ser: 2.55 mg/dL — ABNORMAL HIGH (ref 0.44–1.00)
GFR, Estimated: 20 mL/min — ABNORMAL LOW (ref >60)
Glucose, Bld: 101 mg/dL — ABNORMAL HIGH (ref 70–99)
Phosphorus: 3 mg/dL (ref 2.5–4.6)
Potassium: 3.7 mmol/L (ref 3.5–5.1)
Sodium: 132 mmol/L — ABNORMAL LOW (ref 135–145)

## 2021-12-23 LAB — SURGICAL PCR SCREEN
MRSA, PCR: NEGATIVE
Staphylococcus aureus: POSITIVE — AB

## 2021-12-23 LAB — ABO/RH: ABO/RH(D): O POS

## 2021-12-23 LAB — GLUCOSE, CAPILLARY
Glucose-Capillary: 109 mg/dL — ABNORMAL HIGH (ref 70–99)
Glucose-Capillary: 90 mg/dL (ref 70–99)
Glucose-Capillary: 98 mg/dL (ref 70–99)
Glucose-Capillary: 244 mg/dL — ABNORMAL HIGH (ref 70–99)
Glucose-Capillary: 157 mg/dL — ABNORMAL HIGH (ref 70–99)

## 2021-12-23 SURGERY — INSERTION OF ARTERIOVENOUS (AV) GORE-TEX GRAFT ARM
Anesthesia: Monitor Anesthesia Care | Site: Arm Upper | Laterality: Left

## 2021-12-23 MED ORDER — 0.9 % SODIUM CHLORIDE (POUR BTL) OPTIME
TOPICAL | Status: DC | PRN
  Administered 2021-12-23: 1000 mL

## 2021-12-23 MED ORDER — PHENYLEPHRINE HCL-NACL 20-0.9 MG/250ML-% IV SOLN
INTRAVENOUS | Status: DC | PRN
  Administered 2021-12-23: 50 ug/min via INTRAVENOUS

## 2021-12-23 MED ORDER — DEXAMETHASONE SODIUM PHOSPHATE 10 MG/ML IJ SOLN
INTRAMUSCULAR | Status: DC | PRN
  Administered 2021-12-23: 10 mg via INTRAVENOUS

## 2021-12-23 MED ORDER — ONDANSETRON HCL 4 MG/2ML IJ SOLN: 4.0000 mg | Freq: Once | INTRAMUSCULAR | Status: DC | PRN

## 2021-12-23 MED ORDER — SODIUM CHLORIDE 0.9 % IV SOLN: INTRAVENOUS | Status: DC | PRN

## 2021-12-23 MED ORDER — OXYCODONE HCL 5 MG/5ML PO SOLN: 5.0000 mg | Freq: Once | ORAL | Status: DC | PRN

## 2021-12-23 MED ORDER — ROPIVACAINE HCL 5 MG/ML IJ SOLN
INTRAMUSCULAR | Status: DC | PRN
  Administered 2021-12-23: 30 mL via PERINEURAL

## 2021-12-23 MED ORDER — FENTANYL CITRATE (PF) 250 MCG/5ML IJ SOLN
INTRAMUSCULAR | Status: AC
  Filled 2021-12-23: qty 5

## 2021-12-23 MED ORDER — LIDOCAINE HCL (PF) 1 % IJ SOLN
INTRAMUSCULAR | Status: AC
  Filled 2021-12-23: qty 30

## 2021-12-23 MED ORDER — HEMOSTATIC AGENTS (NO CHARGE) OPTIME
TOPICAL | Status: DC | PRN
  Administered 2021-12-23: 1 via TOPICAL

## 2021-12-23 MED ORDER — CEFAZOLIN SODIUM-DEXTROSE 2-3 GM-%(50ML) IV SOLR
INTRAVENOUS | Status: DC | PRN
  Administered 2021-12-23: 2 g via INTRAVENOUS

## 2021-12-23 MED ORDER — HEPARIN 6000 UNIT IRRIGATION SOLUTION
Status: DC | PRN
  Administered 2021-12-23: 1

## 2021-12-23 MED ORDER — FENTANYL CITRATE (PF) 100 MCG/2ML IJ SOLN: 25.0000 ug | INTRAMUSCULAR | Status: DC | PRN

## 2021-12-23 MED ORDER — CHLORHEXIDINE GLUCONATE CLOTH 2 % EX PADS
6.0000 | MEDICATED_PAD | Freq: Every day | CUTANEOUS | Status: DC
  Administered 2021-12-24 – 2021-12-26 (×3): 6 via TOPICAL

## 2021-12-23 MED ORDER — PROPOFOL 500 MG/50ML IV EMUL
INTRAVENOUS | Status: DC | PRN
  Administered 2021-12-23: 200 ug/kg/min via INTRAVENOUS

## 2021-12-23 MED ORDER — MUPIROCIN 2 % EX OINT
1.0000 | TOPICAL_OINTMENT | Freq: Two times a day (BID) | CUTANEOUS | Status: DC
  Administered 2021-12-23 – 2021-12-26 (×8): 1 via NASAL
  Filled 2021-12-23 (×6): qty 22

## 2021-12-23 MED ORDER — CEFAZOLIN SODIUM-DEXTROSE 2-4 GM/100ML-% IV SOLN
INTRAVENOUS | Status: AC
  Filled 2021-12-23: qty 100

## 2021-12-23 MED ORDER — GLYCOPYRROLATE 0.2 MG/ML IJ SOLN
INTRAMUSCULAR | Status: DC | PRN
  Administered 2021-12-23: .2 mg via INTRAVENOUS

## 2021-12-23 MED ORDER — ONDANSETRON HCL 4 MG/2ML IJ SOLN
INTRAMUSCULAR | Status: DC | PRN
  Administered 2021-12-23: 4 mg via INTRAVENOUS

## 2021-12-23 MED ORDER — PROPOFOL 10 MG/ML IV BOLUS
INTRAVENOUS | Status: AC
  Filled 2021-12-23: qty 20

## 2021-12-23 MED ORDER — PROPOFOL 500 MG/50ML IV EMUL: INTRAVENOUS | Status: DC | PRN

## 2021-12-23 MED ORDER — SODIUM CHLORIDE 0.9 % IV SOLN: 10.0000 mL/h | Freq: Once | INTRAVENOUS | Status: DC

## 2021-12-23 MED ORDER — HEPARIN SODIUM (PORCINE) 1000 UNIT/ML IJ SOLN
INTRAMUSCULAR | Status: DC | PRN
  Administered 2021-12-23: 3000 [IU] via INTRAVENOUS

## 2021-12-23 MED ORDER — HEPARIN 6000 UNIT IRRIGATION SOLUTION
Status: AC
  Filled 2021-12-23: qty 500

## 2021-12-23 MED ORDER — MIDAZOLAM HCL 2 MG/2ML IJ SOLN
INTRAMUSCULAR | Status: AC
  Filled 2021-12-23: qty 2

## 2021-12-23 MED ORDER — OXYCODONE HCL 5 MG PO TABS: 5.0000 mg | ORAL_TABLET | Freq: Once | ORAL | Status: DC | PRN

## 2021-12-23 MED ORDER — MIDAZOLAM HCL 2 MG/2ML IJ SOLN
INTRAMUSCULAR | Status: DC | PRN
  Administered 2021-12-23: 2 mg via INTRAVENOUS

## 2021-12-23 SURGICAL SUPPLY — 44 items
ARMBAND PINK RESTRICT EXTREMIT (MISCELLANEOUS) ×3 IMPLANT
BAG COUNTER SPONGE SURGICOUNT (BAG) ×2 IMPLANT
BLADE CLIPPER SURG (BLADE) ×1 IMPLANT
CANISTER SUCT 3000ML PPV (MISCELLANEOUS) ×2 IMPLANT
CLIP VESOCCLUDE MED 6/CT (CLIP) ×2 IMPLANT
CLIP VESOCCLUDE SM WIDE 6/CT (CLIP) ×2 IMPLANT
COVER PROBE W GEL 5X96 (DRAPES) ×2 IMPLANT
DERMABOND ADVANCED (GAUZE/BANDAGES/DRESSINGS) ×1
DERMABOND ADVANCED .7 DNX12 (GAUZE/BANDAGES/DRESSINGS) ×1 IMPLANT
ELECT REM PT RETURN 9FT ADLT (ELECTROSURGICAL) ×2
ELECTRODE REM PT RTRN 9FT ADLT (ELECTROSURGICAL) ×1 IMPLANT
GLOVE BIO SURGEON STRL SZ 6.5 (GLOVE) ×1 IMPLANT
GLOVE BIO SURGEON STRL SZ7 (GLOVE) ×1 IMPLANT
GLOVE BIO SURGEON STRL SZ7.5 (GLOVE) ×2 IMPLANT
GLOVE BIOGEL PI IND STRL 6.5 (GLOVE) IMPLANT
GLOVE BIOGEL PI IND STRL 7.5 (GLOVE) IMPLANT
GLOVE BIOGEL PI IND STRL 8 (GLOVE) ×1 IMPLANT
GLOVE BIOGEL PI INDICATOR 6.5 (GLOVE) ×1
GLOVE BIOGEL PI INDICATOR 7.5 (GLOVE) ×1
GLOVE BIOGEL PI INDICATOR 8 (GLOVE) ×1
GOWN STRL REUS W/ TWL LRG LVL3 (GOWN DISPOSABLE) ×2 IMPLANT
GOWN STRL REUS W/ TWL XL LVL3 (GOWN DISPOSABLE) ×2 IMPLANT
GOWN STRL REUS W/TWL LRG LVL3 (GOWN DISPOSABLE) ×2
GOWN STRL REUS W/TWL XL LVL3 (GOWN DISPOSABLE) ×2
HEMOSTAT SPONGE AVITENE ULTRA (HEMOSTASIS) IMPLANT
KIT BASIN OR (CUSTOM PROCEDURE TRAY) ×2 IMPLANT
KIT TURNOVER KIT B (KITS) ×2 IMPLANT
NS IRRIG 1000ML POUR BTL (IV SOLUTION) ×2 IMPLANT
PACK CV ACCESS (CUSTOM PROCEDURE TRAY) ×2 IMPLANT
PAD ARMBOARD 7.5X6 YLW CONV (MISCELLANEOUS) ×4 IMPLANT
POWDER SURGICEL 3.0 GRAM (HEMOSTASIS) ×1 IMPLANT
SLING ARM FOAM STRAP LRG (SOFTGOODS) IMPLANT
SLING ARM FOAM STRAP MED (SOFTGOODS) ×1 IMPLANT
SPIKE FLUID TRANSFER (MISCELLANEOUS) ×1 IMPLANT
SUT GORETEX 6.0 TT13 (SUTURE) IMPLANT
SUT MNCRL AB 4-0 PS2 18 (SUTURE) ×2 IMPLANT
SUT PROLENE 6 0 BV (SUTURE) ×3 IMPLANT
SUT PROLENE 7 0 BV 1 (SUTURE) IMPLANT
SUT SILK 2 0 PERMA HAND 18 BK (SUTURE) IMPLANT
SUT VIC AB 3-0 SH 27 (SUTURE) ×2
SUT VIC AB 3-0 SH 27X BRD (SUTURE) ×2 IMPLANT
TOWEL GREEN STERILE (TOWEL DISPOSABLE) ×2 IMPLANT
UNDERPAD 30X36 HEAVY ABSORB (UNDERPADS AND DIAPERS) ×2 IMPLANT
WATER STERILE IRR 1000ML POUR (IV SOLUTION) ×2 IMPLANT

## 2021-12-23 NOTE — TOC Progression Note (Signed)
Transition of Care [facility]) - Progression Note    Patient Details  Name: Rose-Marie Kronenwetter MRN: [id number] Date of Birth: 1950-08-28  Transition of Care [facility]) CM/SW Contact  Tom-Johnson, Hershal Coria, RN Phone Number: 12/23/2021, 5:13 PM  Clinical Narrative:     CM notified by patient that she will be needing transportation to and from dialysis. Patient states she live alone, has three children. One lives locally, the other daughter lives out of state and her son lives in [place].  Patient has Medicaid and CM called Jeri Modena [phone]) and left a secure voice message to see if patient is eligible. Awaiting Carla's call. CM sent an [employer] [place] transportation application via secure email to [employer] at [employer]. Awaiting her response. CM will continue to follow with needs.    Expected Discharge Plan: Home/Self Care Barriers to Discharge: No Barriers Identified  Expected Discharge Plan and Services Expected Discharge Plan: Home/Self Care   Discharge Planning Services: CM Consult   Living arrangements for the past 2 months: Single Family Home                                       Social Determinants of Health (SDOH) Interventions    Readmission Risk Interventions     No data to display

## 2021-12-23 NOTE — Progress Notes (Signed)
Pt not stable for d/c today per medical team. Pt will require HD inpt tomorrow and to start out-pt at clinic on Tuesday. Contacted [facility] SW and spoke to [place]. Clinic aware pt will start at clinic on Tuesday. Renal PA to to send orders to clinic for start of care. Pt advised of schedule earlier this week. Will add appts to [facility].  Olivia Canter Renal Navigator [phone]

## 2021-12-24 ENCOUNTER — Encounter (HOSPITAL_COMMUNITY): Payer: Self-pay | Admitting: Vascular Surgery

## 2021-12-24 DIAGNOSIS — K7031 Alcoholic cirrhosis of liver with ascites: Secondary | ICD-10-CM | POA: Diagnosis not present

## 2021-12-24 LAB — CBC WITH DIFFERENTIAL/PLATELET
Abs Immature Granulocytes: 0.03 10*3/uL (ref 0.00–0.07)
Basophils Absolute: 0 10*3/uL (ref 0.0–0.1)
Basophils Relative: 0 %
Eosinophils Absolute: 0 10*3/uL (ref 0.0–0.5)
Eosinophils Relative: 0 %
HCT: 28.8 % — ABNORMAL LOW (ref 36.0–46.0)
Hemoglobin: 9.4 g/dL — ABNORMAL LOW (ref 12.0–15.0)
Immature Granulocytes: 1 %
Lymphocytes Relative: 10 %
Lymphs Abs: 0.6 10*3/uL — ABNORMAL LOW (ref 0.7–4.0)
MCH: 29.6 pg (ref 26.0–34.0)
MCHC: 32.6 g/dL (ref 30.0–36.0)
MCV: 90.6 fL (ref 80.0–100.0)
Monocytes Absolute: 0.7 10*3/uL (ref 0.1–1.0)
Monocytes Relative: 11 %
Neutro Abs: 4.9 10*3/uL (ref 1.7–7.7)
Neutrophils Relative %: 78 %
Platelets: 85 10*3/uL — ABNORMAL LOW (ref 150–400)
RBC: 3.18 MIL/uL — ABNORMAL LOW (ref 3.87–5.11)
RDW: 14.7 % (ref 11.5–15.5)
WBC: 6.2 10*3/uL (ref 4.0–10.5)
nRBC: 0 % (ref 0.0–0.2)

## 2021-12-24 LAB — BPAM RBC
Blood Product Expiration Date: 202307202359
Blood Product Expiration Date: 202307202359
ISSUE DATE / TIME: 202306230755
ISSUE DATE / TIME: 202306231633
Unit Type and Rh: 5100
Unit Type and Rh: 5100

## 2021-12-24 LAB — TYPE AND SCREEN
ABO/RH(D): O POS
Antibody Screen: NEGATIVE
Unit division: 0
Unit division: 0

## 2021-12-24 LAB — GLUCOSE, CAPILLARY
Glucose-Capillary: 176 mg/dL — ABNORMAL HIGH (ref 70–99)
Glucose-Capillary: 125 mg/dL — ABNORMAL HIGH (ref 70–99)
Glucose-Capillary: 140 mg/dL — ABNORMAL HIGH (ref 70–99)

## 2021-12-24 LAB — RENAL FUNCTION PANEL
Albumin: 2.5 g/dL — ABNORMAL LOW (ref 3.5–5.0)
Anion gap: 10 (ref 5–15)
BUN: 19 mg/dL (ref 8–23)
CO2: 23 mmol/L (ref 22–32)
Calcium: 8.4 mg/dL — ABNORMAL LOW (ref 8.9–10.3)
Chloride: 98 mmol/L (ref 98–111)
Creatinine, Ser: 3.64 mg/dL — ABNORMAL HIGH (ref 0.44–1.00)
GFR, Estimated: 13 mL/min — ABNORMAL LOW (ref >60)
Glucose, Bld: 134 mg/dL — ABNORMAL HIGH (ref 70–99)
Phosphorus: 3.9 mg/dL (ref 2.5–4.6)
Potassium: 3.9 mmol/L (ref 3.5–5.1)
Sodium: 131 mmol/L — ABNORMAL LOW (ref 135–145)

## 2021-12-24 MED ORDER — HEPARIN SODIUM (PORCINE) 1000 UNIT/ML IJ SOLN
INTRAMUSCULAR | Status: AC
  Administered 2021-12-24: 4000 [IU]
  Filled 2021-12-24: qty 4

## 2021-12-24 NOTE — Progress Notes (Signed)
Charge nurse notified nephrology NP of pt sbp 116, ufg changed to 0.5 liters per NP

## 2021-12-25 ENCOUNTER — Encounter (HOSPITAL_COMMUNITY): Payer: Self-pay | Admitting: Vascular Surgery

## 2021-12-25 DIAGNOSIS — K7031 Alcoholic cirrhosis of liver with ascites: Secondary | ICD-10-CM | POA: Diagnosis not present

## 2021-12-25 LAB — CBC WITH DIFFERENTIAL/PLATELET
Abs Immature Granulocytes: 0.03 10*3/uL (ref 0.00–0.07)
Basophils Absolute: 0 10*3/uL (ref 0.0–0.1)
Basophils Relative: 1 %
Eosinophils Absolute: 0.1 10*3/uL (ref 0.0–0.5)
Eosinophils Relative: 2 %
HCT: 29 % — ABNORMAL LOW (ref 36.0–46.0)
Hemoglobin: 9.7 g/dL — ABNORMAL LOW (ref 12.0–15.0)
Immature Granulocytes: 1 %
Lymphocytes Relative: 14 %
Lymphs Abs: 0.7 10*3/uL (ref 0.7–4.0)
MCH: 30.6 pg (ref 26.0–34.0)
MCHC: 33.4 g/dL (ref 30.0–36.0)
MCV: 91.5 fL (ref 80.0–100.0)
Monocytes Absolute: 0.7 10*3/uL (ref 0.1–1.0)
Monocytes Relative: 13 %
Neutro Abs: 3.4 10*3/uL (ref 1.7–7.7)
Neutrophils Relative %: 69 %
Platelets: 75 10*3/uL — ABNORMAL LOW (ref 150–400)
RBC: 3.17 MIL/uL — ABNORMAL LOW (ref 3.87–5.11)
RDW: 14.5 % (ref 11.5–15.5)
WBC: 4.9 10*3/uL (ref 4.0–10.5)
nRBC: 0 % (ref 0.0–0.2)

## 2021-12-25 LAB — RENAL FUNCTION PANEL
Albumin: 2.4 g/dL — ABNORMAL LOW (ref 3.5–5.0)
Anion gap: 9 (ref 5–15)
BUN: 12 mg/dL (ref 8–23)
CO2: 24 mmol/L (ref 22–32)
Calcium: 7.9 mg/dL — ABNORMAL LOW (ref 8.9–10.3)
Chloride: 100 mmol/L (ref 98–111)
Creatinine, Ser: 2.41 mg/dL — ABNORMAL HIGH (ref 0.44–1.00)
GFR, Estimated: 21 mL/min — ABNORMAL LOW (ref >60)
Glucose, Bld: 128 mg/dL — ABNORMAL HIGH (ref 70–99)
Phosphorus: 2.3 mg/dL — ABNORMAL LOW (ref 2.5–4.6)
Potassium: 3.6 mmol/L (ref 3.5–5.1)
Sodium: 133 mmol/L — ABNORMAL LOW (ref 135–145)

## 2021-12-25 LAB — GLUCOSE, CAPILLARY
Glucose-Capillary: 108 mg/dL — ABNORMAL HIGH (ref 70–99)
Glucose-Capillary: 120 mg/dL — ABNORMAL HIGH (ref 70–99)
Glucose-Capillary: 117 mg/dL — ABNORMAL HIGH (ref 70–99)
Glucose-Capillary: 132 mg/dL — ABNORMAL HIGH (ref 70–99)

## 2021-12-26 DIAGNOSIS — K7031 Alcoholic cirrhosis of liver with ascites: Secondary | ICD-10-CM | POA: Diagnosis not present

## 2021-12-26 LAB — CBC WITH DIFFERENTIAL/PLATELET
Abs Immature Granulocytes: 0.01 10*3/uL (ref 0.00–0.07)
Basophils Absolute: 0 10*3/uL (ref 0.0–0.1)
Basophils Relative: 1 %
Eosinophils Absolute: 0.2 10*3/uL (ref 0.0–0.5)
Eosinophils Relative: 4 %
HCT: 29.6 % — ABNORMAL LOW (ref 36.0–46.0)
Hemoglobin: 9.2 g/dL — ABNORMAL LOW (ref 12.0–15.0)
Immature Granulocytes: 0 %
Lymphocytes Relative: 26 %
Lymphs Abs: 1 10*3/uL (ref 0.7–4.0)
MCH: 29.5 pg (ref 26.0–34.0)
MCHC: 31.1 g/dL (ref 30.0–36.0)
MCV: 94.9 fL (ref 80.0–100.0)
Monocytes Absolute: 0.6 10*3/uL (ref 0.1–1.0)
Monocytes Relative: 16 %
Neutro Abs: 2 10*3/uL (ref 1.7–7.7)
Neutrophils Relative %: 53 %
Platelets: 73 10*3/uL — ABNORMAL LOW (ref 150–400)
RBC: 3.12 MIL/uL — ABNORMAL LOW (ref 3.87–5.11)
RDW: 14.5 % (ref 11.5–15.5)
WBC: 3.7 10*3/uL — ABNORMAL LOW (ref 4.0–10.5)
nRBC: 0 % (ref 0.0–0.2)

## 2021-12-26 LAB — GLUCOSE, CAPILLARY
Glucose-Capillary: 95 mg/dL (ref 70–99)
Glucose-Capillary: 142 mg/dL — ABNORMAL HIGH (ref 70–99)
Glucose-Capillary: 152 mg/dL — ABNORMAL HIGH (ref 70–99)
Glucose-Capillary: 106 mg/dL — ABNORMAL HIGH (ref 70–99)

## 2021-12-26 LAB — RENAL FUNCTION PANEL
Albumin: 2.3 g/dL — ABNORMAL LOW (ref 3.5–5.0)
Anion gap: 12 (ref 5–15)
BUN: 23 mg/dL (ref 8–23)
CO2: 23 mmol/L (ref 22–32)
Calcium: 8.2 mg/dL — ABNORMAL LOW (ref 8.9–10.3)
Chloride: 100 mmol/L (ref 98–111)
Creatinine, Ser: 3.89 mg/dL — ABNORMAL HIGH (ref 0.44–1.00)
GFR, Estimated: 12 mL/min — ABNORMAL LOW (ref >60)
Glucose, Bld: 106 mg/dL — ABNORMAL HIGH (ref 70–99)
Phosphorus: 3 mg/dL (ref 2.5–4.6)
Potassium: 3.8 mmol/L (ref 3.5–5.1)
Sodium: 135 mmol/L (ref 135–145)

## 2021-12-27 DIAGNOSIS — K7031 Alcoholic cirrhosis of liver with ascites: Secondary | ICD-10-CM | POA: Diagnosis not present

## 2021-12-27 LAB — RENAL FUNCTION PANEL
Albumin: 2.5 g/dL — ABNORMAL LOW (ref 3.5–5.0)
Anion gap: 9 (ref 5–15)
BUN: 30 mg/dL — ABNORMAL HIGH (ref 8–23)
CO2: 18 mmol/L — ABNORMAL LOW (ref 22–32)
Calcium: 8.3 mg/dL — ABNORMAL LOW (ref 8.9–10.3)
Chloride: 106 mmol/L (ref 98–111)
Creatinine, Ser: 3.92 mg/dL — ABNORMAL HIGH (ref 0.44–1.00)
GFR, Estimated: 12 mL/min — ABNORMAL LOW (ref >60)
Glucose, Bld: 83 mg/dL (ref 70–99)
Phosphorus: 4.2 mg/dL (ref 2.5–4.6)
Potassium: 5 mmol/L (ref 3.5–5.1)
Sodium: 133 mmol/L — ABNORMAL LOW (ref 135–145)

## 2021-12-27 LAB — DIFFERENTIAL
Abs Immature Granulocytes: 0.02 10*3/uL (ref 0.00–0.07)
Basophils Absolute: 0 10*3/uL (ref 0.0–0.1)
Basophils Relative: 1 %
Eosinophils Absolute: 0.3 10*3/uL (ref 0.0–0.5)
Eosinophils Relative: 7 %
Immature Granulocytes: 1 %
Lymphocytes Relative: 25 %
Lymphs Abs: 0.9 10*3/uL (ref 0.7–4.0)
Monocytes Absolute: 0.6 10*3/uL (ref 0.1–1.0)
Monocytes Relative: 17 %
Neutro Abs: 1.9 10*3/uL (ref 1.7–7.7)
Neutrophils Relative %: 49 %

## 2021-12-27 LAB — GLUCOSE, CAPILLARY
Glucose-Capillary: 79 mg/dL (ref 70–99)
Glucose-Capillary: 92 mg/dL (ref 70–99)
Glucose-Capillary: 113 mg/dL — ABNORMAL HIGH (ref 70–99)

## 2021-12-27 LAB — SURGICAL PATHOLOGY

## 2021-12-27 LAB — CBC
HCT: 30.7 % — ABNORMAL LOW (ref 36.0–46.0)
Hemoglobin: 10 g/dL — ABNORMAL LOW (ref 12.0–15.0)
MCH: 30.4 pg (ref 26.0–34.0)
MCHC: 32.6 g/dL (ref 30.0–36.0)
MCV: 93.3 fL (ref 80.0–100.0)
Platelets: 80 10*3/uL — ABNORMAL LOW (ref 150–400)
RBC: 3.29 MIL/uL — ABNORMAL LOW (ref 3.87–5.11)
RDW: 14.1 % (ref 11.5–15.5)
WBC: 3.8 10*3/uL — ABNORMAL LOW (ref 4.0–10.5)
nRBC: 0 % (ref 0.0–0.2)

## 2021-12-27 MED ORDER — HEPARIN SODIUM (PORCINE) 1000 UNIT/ML IJ SOLN
INTRAMUSCULAR | Status: AC
  Administered 2021-12-27: 4000 [IU] via ARTERIOVENOUS_FISTULA
  Filled 2021-12-27: qty 4

## 2021-12-27 MED ORDER — LACTULOSE 10 GM/15ML PO SOLN: 30.0000 g | Freq: Three times a day (TID) | ORAL | 2 refills | Status: AC | PRN

## 2021-12-27 NOTE — TOC Transition Note (Signed)
Transition of Care [facility]) - CM/SW Discharge Note   Patient Details  Name: Sarea Gemmel MRN: [id number] Date of Birth: [birth year]  Transition of Care [facility]) CM/SW Contact:  Tom-Johnson, Hershal Coria, RN Phone Number: 12/27/2021, 3:55 PM  Patient is scheduled for discharge today. Friend to transport at discharge. No further TOC needs noted.   Clinical Narrative:       Final next level of care: Home/Self Care Barriers to Discharge: Barriers Resolved   Patient Goals and CMS Choice Patient states their goals for this hospitalization and ongoing recovery are:: To return home CMS Medicare.gov Compare Post Acute Care list provided to:: Patient Choice offered to / list presented to : Patient  Discharge Placement                Patient to be transferred to facility by: Friend      Discharge Plan and Services   Discharge Planning Services: CM Consult            DME Arranged: N/A DME Agency: NA       HH Arranged: NA HH Agency: NA        Social Determinants of Health (SDOH) Interventions     Readmission Risk Interventions     No data to display

## 2021-12-29 DIAGNOSIS — D509 Iron deficiency anemia, unspecified: Secondary | ICD-10-CM | POA: Diagnosis not present

## 2021-12-29 DIAGNOSIS — D689 Coagulation defect, unspecified: Secondary | ICD-10-CM | POA: Diagnosis not present

## 2021-12-29 DIAGNOSIS — E1122 Type 2 diabetes mellitus with diabetic chronic kidney disease: Secondary | ICD-10-CM | POA: Diagnosis not present

## 2021-12-29 DIAGNOSIS — Z992 Dependence on renal dialysis: Secondary | ICD-10-CM | POA: Diagnosis not present

## 2021-12-29 DIAGNOSIS — N179 Acute kidney failure, unspecified: Secondary | ICD-10-CM | POA: Diagnosis not present

## 2021-12-29 DIAGNOSIS — R519 Headache, unspecified: Secondary | ICD-10-CM | POA: Diagnosis not present

## 2021-12-29 DIAGNOSIS — N2581 Secondary hyperparathyroidism of renal origin: Secondary | ICD-10-CM | POA: Diagnosis not present

## 2021-12-29 LAB — FUNGAL ORGANISM REFLEX

## 2021-12-29 LAB — FUNGUS CULTURE WITH STAIN

## 2021-12-29 LAB — FUNGUS CULTURE RESULT

## 2021-12-31 DIAGNOSIS — N2581 Secondary hyperparathyroidism of renal origin: Secondary | ICD-10-CM | POA: Diagnosis not present

## 2021-12-31 DIAGNOSIS — E1122 Type 2 diabetes mellitus with diabetic chronic kidney disease: Secondary | ICD-10-CM | POA: Diagnosis not present

## 2021-12-31 DIAGNOSIS — D509 Iron deficiency anemia, unspecified: Secondary | ICD-10-CM | POA: Diagnosis not present

## 2021-12-31 DIAGNOSIS — D689 Coagulation defect, unspecified: Secondary | ICD-10-CM | POA: Diagnosis not present

## 2021-12-31 DIAGNOSIS — D631 Anemia in chronic kidney disease: Secondary | ICD-10-CM | POA: Diagnosis not present

## 2021-12-31 DIAGNOSIS — N179 Acute kidney failure, unspecified: Secondary | ICD-10-CM | POA: Diagnosis not present

## 2021-12-31 DIAGNOSIS — R519 Headache, unspecified: Secondary | ICD-10-CM | POA: Diagnosis not present

## 2021-12-31 DIAGNOSIS — T7840XA Allergy, unspecified, initial encounter: Secondary | ICD-10-CM | POA: Diagnosis not present

## 2021-12-31 DIAGNOSIS — Z992 Dependence on renal dialysis: Secondary | ICD-10-CM | POA: Diagnosis not present

## 2022-01-03 DIAGNOSIS — Z992 Dependence on renal dialysis: Secondary | ICD-10-CM | POA: Diagnosis not present

## 2022-01-03 DIAGNOSIS — E1122 Type 2 diabetes mellitus with diabetic chronic kidney disease: Secondary | ICD-10-CM | POA: Diagnosis not present

## 2022-01-03 DIAGNOSIS — T7840XA Allergy, unspecified, initial encounter: Secondary | ICD-10-CM | POA: Diagnosis not present

## 2022-01-03 DIAGNOSIS — N179 Acute kidney failure, unspecified: Secondary | ICD-10-CM | POA: Diagnosis not present

## 2022-01-03 DIAGNOSIS — D509 Iron deficiency anemia, unspecified: Secondary | ICD-10-CM | POA: Diagnosis not present

## 2022-01-03 DIAGNOSIS — N2581 Secondary hyperparathyroidism of renal origin: Secondary | ICD-10-CM | POA: Diagnosis not present

## 2022-01-03 DIAGNOSIS — R519 Headache, unspecified: Secondary | ICD-10-CM | POA: Diagnosis not present

## 2022-01-03 DIAGNOSIS — D631 Anemia in chronic kidney disease: Secondary | ICD-10-CM | POA: Diagnosis not present

## 2022-01-03 DIAGNOSIS — D689 Coagulation defect, unspecified: Secondary | ICD-10-CM | POA: Diagnosis not present

## 2022-01-05 DIAGNOSIS — N2581 Secondary hyperparathyroidism of renal origin: Secondary | ICD-10-CM | POA: Diagnosis not present

## 2022-01-05 DIAGNOSIS — Z992 Dependence on renal dialysis: Secondary | ICD-10-CM | POA: Diagnosis not present

## 2022-01-05 DIAGNOSIS — D689 Coagulation defect, unspecified: Secondary | ICD-10-CM | POA: Diagnosis not present

## 2022-01-05 DIAGNOSIS — E1122 Type 2 diabetes mellitus with diabetic chronic kidney disease: Secondary | ICD-10-CM | POA: Diagnosis not present

## 2022-01-05 DIAGNOSIS — D631 Anemia in chronic kidney disease: Secondary | ICD-10-CM | POA: Diagnosis not present

## 2022-01-05 DIAGNOSIS — R519 Headache, unspecified: Secondary | ICD-10-CM | POA: Diagnosis not present

## 2022-01-05 DIAGNOSIS — T7840XA Allergy, unspecified, initial encounter: Secondary | ICD-10-CM | POA: Diagnosis not present

## 2022-01-05 DIAGNOSIS — N179 Acute kidney failure, unspecified: Secondary | ICD-10-CM | POA: Diagnosis not present

## 2022-01-05 DIAGNOSIS — D509 Iron deficiency anemia, unspecified: Secondary | ICD-10-CM | POA: Diagnosis not present

## 2022-01-07 DIAGNOSIS — D631 Anemia in chronic kidney disease: Secondary | ICD-10-CM | POA: Diagnosis not present

## 2022-01-07 DIAGNOSIS — E1122 Type 2 diabetes mellitus with diabetic chronic kidney disease: Secondary | ICD-10-CM | POA: Diagnosis not present

## 2022-01-07 DIAGNOSIS — N179 Acute kidney failure, unspecified: Secondary | ICD-10-CM | POA: Diagnosis not present

## 2022-01-07 DIAGNOSIS — D689 Coagulation defect, unspecified: Secondary | ICD-10-CM | POA: Diagnosis not present

## 2022-01-07 DIAGNOSIS — N2581 Secondary hyperparathyroidism of renal origin: Secondary | ICD-10-CM | POA: Diagnosis not present

## 2022-01-07 DIAGNOSIS — R519 Headache, unspecified: Secondary | ICD-10-CM | POA: Diagnosis not present

## 2022-01-07 DIAGNOSIS — T7840XA Allergy, unspecified, initial encounter: Secondary | ICD-10-CM | POA: Diagnosis not present

## 2022-01-07 DIAGNOSIS — D509 Iron deficiency anemia, unspecified: Secondary | ICD-10-CM | POA: Diagnosis not present

## 2022-01-07 DIAGNOSIS — Z992 Dependence on renal dialysis: Secondary | ICD-10-CM | POA: Diagnosis not present

## 2022-01-10 DIAGNOSIS — E1122 Type 2 diabetes mellitus with diabetic chronic kidney disease: Secondary | ICD-10-CM | POA: Diagnosis not present

## 2022-01-10 DIAGNOSIS — T7840XA Allergy, unspecified, initial encounter: Secondary | ICD-10-CM | POA: Diagnosis not present

## 2022-01-10 DIAGNOSIS — D689 Coagulation defect, unspecified: Secondary | ICD-10-CM | POA: Diagnosis not present

## 2022-01-10 DIAGNOSIS — D509 Iron deficiency anemia, unspecified: Secondary | ICD-10-CM | POA: Diagnosis not present

## 2022-01-10 DIAGNOSIS — D631 Anemia in chronic kidney disease: Secondary | ICD-10-CM | POA: Diagnosis not present

## 2022-01-10 DIAGNOSIS — Z992 Dependence on renal dialysis: Secondary | ICD-10-CM | POA: Diagnosis not present

## 2022-01-10 DIAGNOSIS — N179 Acute kidney failure, unspecified: Secondary | ICD-10-CM | POA: Diagnosis not present

## 2022-01-10 DIAGNOSIS — R519 Headache, unspecified: Secondary | ICD-10-CM | POA: Diagnosis not present

## 2022-01-10 DIAGNOSIS — N2581 Secondary hyperparathyroidism of renal origin: Secondary | ICD-10-CM | POA: Diagnosis not present

## 2022-01-11 ENCOUNTER — Telehealth: Payer: Self-pay | Admitting: Gastroenterology

## 2022-01-11 NOTE — Telephone Encounter (Signed)
Stacey Chambers she does need the appt.  Can you let her know?

## 2022-01-12 DIAGNOSIS — Z992 Dependence on renal dialysis: Secondary | ICD-10-CM | POA: Diagnosis not present

## 2022-01-12 DIAGNOSIS — T7840XA Allergy, unspecified, initial encounter: Secondary | ICD-10-CM | POA: Diagnosis not present

## 2022-01-12 DIAGNOSIS — D509 Iron deficiency anemia, unspecified: Secondary | ICD-10-CM | POA: Diagnosis not present

## 2022-01-12 DIAGNOSIS — E1122 Type 2 diabetes mellitus with diabetic chronic kidney disease: Secondary | ICD-10-CM | POA: Diagnosis not present

## 2022-01-12 DIAGNOSIS — R519 Headache, unspecified: Secondary | ICD-10-CM | POA: Diagnosis not present

## 2022-01-12 DIAGNOSIS — D631 Anemia in chronic kidney disease: Secondary | ICD-10-CM | POA: Diagnosis not present

## 2022-01-12 DIAGNOSIS — D689 Coagulation defect, unspecified: Secondary | ICD-10-CM | POA: Diagnosis not present

## 2022-01-12 DIAGNOSIS — N179 Acute kidney failure, unspecified: Secondary | ICD-10-CM | POA: Diagnosis not present

## 2022-01-12 DIAGNOSIS — N2581 Secondary hyperparathyroidism of renal origin: Secondary | ICD-10-CM | POA: Diagnosis not present

## 2022-01-14 DIAGNOSIS — N2581 Secondary hyperparathyroidism of renal origin: Secondary | ICD-10-CM | POA: Diagnosis not present

## 2022-01-14 DIAGNOSIS — E1122 Type 2 diabetes mellitus with diabetic chronic kidney disease: Secondary | ICD-10-CM | POA: Diagnosis not present

## 2022-01-14 DIAGNOSIS — R519 Headache, unspecified: Secondary | ICD-10-CM | POA: Diagnosis not present

## 2022-01-14 DIAGNOSIS — D689 Coagulation defect, unspecified: Secondary | ICD-10-CM | POA: Diagnosis not present

## 2022-01-14 DIAGNOSIS — Z992 Dependence on renal dialysis: Secondary | ICD-10-CM | POA: Diagnosis not present

## 2022-01-14 DIAGNOSIS — D509 Iron deficiency anemia, unspecified: Secondary | ICD-10-CM | POA: Diagnosis not present

## 2022-01-14 DIAGNOSIS — D631 Anemia in chronic kidney disease: Secondary | ICD-10-CM | POA: Diagnosis not present

## 2022-01-14 DIAGNOSIS — T7840XA Allergy, unspecified, initial encounter: Secondary | ICD-10-CM | POA: Diagnosis not present

## 2022-01-14 DIAGNOSIS — N179 Acute kidney failure, unspecified: Secondary | ICD-10-CM | POA: Diagnosis not present

## 2022-01-17 DIAGNOSIS — T7840XA Allergy, unspecified, initial encounter: Secondary | ICD-10-CM | POA: Diagnosis not present

## 2022-01-17 DIAGNOSIS — N179 Acute kidney failure, unspecified: Secondary | ICD-10-CM | POA: Diagnosis not present

## 2022-01-17 DIAGNOSIS — E1122 Type 2 diabetes mellitus with diabetic chronic kidney disease: Secondary | ICD-10-CM | POA: Diagnosis not present

## 2022-01-17 DIAGNOSIS — D689 Coagulation defect, unspecified: Secondary | ICD-10-CM | POA: Diagnosis not present

## 2022-01-17 DIAGNOSIS — R519 Headache, unspecified: Secondary | ICD-10-CM | POA: Diagnosis not present

## 2022-01-17 DIAGNOSIS — Z992 Dependence on renal dialysis: Secondary | ICD-10-CM | POA: Diagnosis not present

## 2022-01-17 DIAGNOSIS — N2581 Secondary hyperparathyroidism of renal origin: Secondary | ICD-10-CM | POA: Diagnosis not present

## 2022-01-17 DIAGNOSIS — D509 Iron deficiency anemia, unspecified: Secondary | ICD-10-CM | POA: Diagnosis not present

## 2022-01-17 DIAGNOSIS — D631 Anemia in chronic kidney disease: Secondary | ICD-10-CM | POA: Diagnosis not present

## 2022-01-18 LAB — ACID FAST CULTURE WITH REFLEXED SENSITIVITIES (MYCOBACTERIA): Acid Fast Culture: NEGATIVE

## 2022-01-19 DIAGNOSIS — N2581 Secondary hyperparathyroidism of renal origin: Secondary | ICD-10-CM | POA: Diagnosis not present

## 2022-01-19 DIAGNOSIS — N179 Acute kidney failure, unspecified: Secondary | ICD-10-CM | POA: Diagnosis not present

## 2022-01-19 DIAGNOSIS — T7840XA Allergy, unspecified, initial encounter: Secondary | ICD-10-CM | POA: Diagnosis not present

## 2022-01-19 DIAGNOSIS — D631 Anemia in chronic kidney disease: Secondary | ICD-10-CM | POA: Diagnosis not present

## 2022-01-19 DIAGNOSIS — Z992 Dependence on renal dialysis: Secondary | ICD-10-CM | POA: Diagnosis not present

## 2022-01-19 DIAGNOSIS — E1122 Type 2 diabetes mellitus with diabetic chronic kidney disease: Secondary | ICD-10-CM | POA: Diagnosis not present

## 2022-01-19 DIAGNOSIS — D689 Coagulation defect, unspecified: Secondary | ICD-10-CM | POA: Diagnosis not present

## 2022-01-19 DIAGNOSIS — D509 Iron deficiency anemia, unspecified: Secondary | ICD-10-CM | POA: Diagnosis not present

## 2022-01-19 DIAGNOSIS — R519 Headache, unspecified: Secondary | ICD-10-CM | POA: Diagnosis not present

## 2022-01-21 DIAGNOSIS — N2581 Secondary hyperparathyroidism of renal origin: Secondary | ICD-10-CM | POA: Diagnosis not present

## 2022-01-21 DIAGNOSIS — E1122 Type 2 diabetes mellitus with diabetic chronic kidney disease: Secondary | ICD-10-CM | POA: Diagnosis not present

## 2022-01-21 DIAGNOSIS — Z992 Dependence on renal dialysis: Secondary | ICD-10-CM | POA: Diagnosis not present

## 2022-01-21 DIAGNOSIS — T7840XA Allergy, unspecified, initial encounter: Secondary | ICD-10-CM | POA: Diagnosis not present

## 2022-01-21 DIAGNOSIS — D509 Iron deficiency anemia, unspecified: Secondary | ICD-10-CM | POA: Diagnosis not present

## 2022-01-21 DIAGNOSIS — N179 Acute kidney failure, unspecified: Secondary | ICD-10-CM | POA: Diagnosis not present

## 2022-01-21 DIAGNOSIS — D689 Coagulation defect, unspecified: Secondary | ICD-10-CM | POA: Diagnosis not present

## 2022-01-21 DIAGNOSIS — D631 Anemia in chronic kidney disease: Secondary | ICD-10-CM | POA: Diagnosis not present

## 2022-01-21 DIAGNOSIS — R519 Headache, unspecified: Secondary | ICD-10-CM | POA: Diagnosis not present

## 2022-01-24 ENCOUNTER — Ambulatory Visit: Payer: Medicare Other | Admitting: Gastroenterology

## 2022-01-24 DIAGNOSIS — N2581 Secondary hyperparathyroidism of renal origin: Secondary | ICD-10-CM | POA: Diagnosis not present

## 2022-01-24 DIAGNOSIS — Z992 Dependence on renal dialysis: Secondary | ICD-10-CM | POA: Diagnosis not present

## 2022-01-24 DIAGNOSIS — N179 Acute kidney failure, unspecified: Secondary | ICD-10-CM | POA: Diagnosis not present

## 2022-01-24 DIAGNOSIS — D689 Coagulation defect, unspecified: Secondary | ICD-10-CM | POA: Diagnosis not present

## 2022-01-24 DIAGNOSIS — R519 Headache, unspecified: Secondary | ICD-10-CM | POA: Diagnosis not present

## 2022-01-24 DIAGNOSIS — D631 Anemia in chronic kidney disease: Secondary | ICD-10-CM | POA: Diagnosis not present

## 2022-01-24 DIAGNOSIS — T7840XA Allergy, unspecified, initial encounter: Secondary | ICD-10-CM | POA: Diagnosis not present

## 2022-01-24 DIAGNOSIS — D509 Iron deficiency anemia, unspecified: Secondary | ICD-10-CM | POA: Diagnosis not present

## 2022-01-24 DIAGNOSIS — E1122 Type 2 diabetes mellitus with diabetic chronic kidney disease: Secondary | ICD-10-CM | POA: Diagnosis not present

## 2022-01-25 ENCOUNTER — Other Ambulatory Visit: Payer: Self-pay | Admitting: *Deleted

## 2022-01-25 DIAGNOSIS — H25041 Posterior subcapsular polar age-related cataract, right eye: Secondary | ICD-10-CM | POA: Diagnosis not present

## 2022-01-25 DIAGNOSIS — N186 End stage renal disease: Secondary | ICD-10-CM

## 2022-01-26 DIAGNOSIS — D631 Anemia in chronic kidney disease: Secondary | ICD-10-CM | POA: Diagnosis not present

## 2022-01-26 DIAGNOSIS — D689 Coagulation defect, unspecified: Secondary | ICD-10-CM | POA: Diagnosis not present

## 2022-01-26 DIAGNOSIS — R519 Headache, unspecified: Secondary | ICD-10-CM | POA: Diagnosis not present

## 2022-01-26 DIAGNOSIS — E1122 Type 2 diabetes mellitus with diabetic chronic kidney disease: Secondary | ICD-10-CM | POA: Diagnosis not present

## 2022-01-26 DIAGNOSIS — Z992 Dependence on renal dialysis: Secondary | ICD-10-CM | POA: Diagnosis not present

## 2022-01-26 DIAGNOSIS — N2581 Secondary hyperparathyroidism of renal origin: Secondary | ICD-10-CM | POA: Diagnosis not present

## 2022-01-26 DIAGNOSIS — T7840XA Allergy, unspecified, initial encounter: Secondary | ICD-10-CM | POA: Diagnosis not present

## 2022-01-26 DIAGNOSIS — N179 Acute kidney failure, unspecified: Secondary | ICD-10-CM | POA: Diagnosis not present

## 2022-01-26 DIAGNOSIS — D509 Iron deficiency anemia, unspecified: Secondary | ICD-10-CM | POA: Diagnosis not present

## 2022-01-28 DIAGNOSIS — D631 Anemia in chronic kidney disease: Secondary | ICD-10-CM | POA: Diagnosis not present

## 2022-01-28 DIAGNOSIS — D509 Iron deficiency anemia, unspecified: Secondary | ICD-10-CM | POA: Diagnosis not present

## 2022-01-28 DIAGNOSIS — E1122 Type 2 diabetes mellitus with diabetic chronic kidney disease: Secondary | ICD-10-CM | POA: Diagnosis not present

## 2022-01-28 DIAGNOSIS — R519 Headache, unspecified: Secondary | ICD-10-CM | POA: Diagnosis not present

## 2022-01-28 DIAGNOSIS — Z992 Dependence on renal dialysis: Secondary | ICD-10-CM | POA: Diagnosis not present

## 2022-01-28 DIAGNOSIS — N2581 Secondary hyperparathyroidism of renal origin: Secondary | ICD-10-CM | POA: Diagnosis not present

## 2022-01-28 DIAGNOSIS — D689 Coagulation defect, unspecified: Secondary | ICD-10-CM | POA: Diagnosis not present

## 2022-01-28 DIAGNOSIS — N179 Acute kidney failure, unspecified: Secondary | ICD-10-CM | POA: Diagnosis not present

## 2022-01-28 DIAGNOSIS — T7840XA Allergy, unspecified, initial encounter: Secondary | ICD-10-CM | POA: Diagnosis not present

## 2022-01-31 DIAGNOSIS — D509 Iron deficiency anemia, unspecified: Secondary | ICD-10-CM | POA: Diagnosis not present

## 2022-01-31 DIAGNOSIS — N2581 Secondary hyperparathyroidism of renal origin: Secondary | ICD-10-CM | POA: Diagnosis not present

## 2022-01-31 DIAGNOSIS — R519 Headache, unspecified: Secondary | ICD-10-CM | POA: Diagnosis not present

## 2022-01-31 DIAGNOSIS — Z992 Dependence on renal dialysis: Secondary | ICD-10-CM | POA: Diagnosis not present

## 2022-01-31 DIAGNOSIS — D631 Anemia in chronic kidney disease: Secondary | ICD-10-CM | POA: Diagnosis not present

## 2022-01-31 DIAGNOSIS — D689 Coagulation defect, unspecified: Secondary | ICD-10-CM | POA: Diagnosis not present

## 2022-01-31 DIAGNOSIS — N179 Acute kidney failure, unspecified: Secondary | ICD-10-CM | POA: Diagnosis not present

## 2022-02-02 DIAGNOSIS — R519 Headache, unspecified: Secondary | ICD-10-CM | POA: Diagnosis not present

## 2022-02-02 DIAGNOSIS — N179 Acute kidney failure, unspecified: Secondary | ICD-10-CM | POA: Diagnosis not present

## 2022-02-02 DIAGNOSIS — D689 Coagulation defect, unspecified: Secondary | ICD-10-CM | POA: Diagnosis not present

## 2022-02-02 DIAGNOSIS — D509 Iron deficiency anemia, unspecified: Secondary | ICD-10-CM | POA: Diagnosis not present

## 2022-02-02 DIAGNOSIS — N2581 Secondary hyperparathyroidism of renal origin: Secondary | ICD-10-CM | POA: Diagnosis not present

## 2022-02-02 DIAGNOSIS — Z992 Dependence on renal dialysis: Secondary | ICD-10-CM | POA: Diagnosis not present

## 2022-02-02 DIAGNOSIS — D631 Anemia in chronic kidney disease: Secondary | ICD-10-CM | POA: Diagnosis not present

## 2022-02-04 DIAGNOSIS — R519 Headache, unspecified: Secondary | ICD-10-CM | POA: Diagnosis not present

## 2022-02-04 DIAGNOSIS — D689 Coagulation defect, unspecified: Secondary | ICD-10-CM | POA: Diagnosis not present

## 2022-02-04 DIAGNOSIS — Z992 Dependence on renal dialysis: Secondary | ICD-10-CM | POA: Diagnosis not present

## 2022-02-04 DIAGNOSIS — N179 Acute kidney failure, unspecified: Secondary | ICD-10-CM | POA: Diagnosis not present

## 2022-02-04 DIAGNOSIS — D631 Anemia in chronic kidney disease: Secondary | ICD-10-CM | POA: Diagnosis not present

## 2022-02-04 DIAGNOSIS — D509 Iron deficiency anemia, unspecified: Secondary | ICD-10-CM | POA: Diagnosis not present

## 2022-02-04 DIAGNOSIS — N2581 Secondary hyperparathyroidism of renal origin: Secondary | ICD-10-CM | POA: Diagnosis not present

## 2022-02-07 ENCOUNTER — Ambulatory Visit: Payer: Medicare Other | Admitting: Gastroenterology

## 2022-02-07 DIAGNOSIS — D631 Anemia in chronic kidney disease: Secondary | ICD-10-CM | POA: Diagnosis not present

## 2022-02-07 DIAGNOSIS — D689 Coagulation defect, unspecified: Secondary | ICD-10-CM | POA: Diagnosis not present

## 2022-02-07 DIAGNOSIS — N2581 Secondary hyperparathyroidism of renal origin: Secondary | ICD-10-CM | POA: Diagnosis not present

## 2022-02-07 DIAGNOSIS — D509 Iron deficiency anemia, unspecified: Secondary | ICD-10-CM | POA: Diagnosis not present

## 2022-02-07 DIAGNOSIS — N179 Acute kidney failure, unspecified: Secondary | ICD-10-CM | POA: Diagnosis not present

## 2022-02-07 DIAGNOSIS — Z992 Dependence on renal dialysis: Secondary | ICD-10-CM | POA: Diagnosis not present

## 2022-02-07 DIAGNOSIS — R519 Headache, unspecified: Secondary | ICD-10-CM | POA: Diagnosis not present

## 2022-02-09 DIAGNOSIS — R519 Headache, unspecified: Secondary | ICD-10-CM | POA: Diagnosis not present

## 2022-02-09 DIAGNOSIS — N2581 Secondary hyperparathyroidism of renal origin: Secondary | ICD-10-CM | POA: Diagnosis not present

## 2022-02-09 DIAGNOSIS — D509 Iron deficiency anemia, unspecified: Secondary | ICD-10-CM | POA: Diagnosis not present

## 2022-02-09 DIAGNOSIS — D631 Anemia in chronic kidney disease: Secondary | ICD-10-CM | POA: Diagnosis not present

## 2022-02-09 DIAGNOSIS — N179 Acute kidney failure, unspecified: Secondary | ICD-10-CM | POA: Diagnosis not present

## 2022-02-09 DIAGNOSIS — D689 Coagulation defect, unspecified: Secondary | ICD-10-CM | POA: Diagnosis not present

## 2022-02-09 DIAGNOSIS — Z992 Dependence on renal dialysis: Secondary | ICD-10-CM | POA: Diagnosis not present

## 2022-02-11 DIAGNOSIS — R519 Headache, unspecified: Secondary | ICD-10-CM | POA: Diagnosis not present

## 2022-02-11 DIAGNOSIS — Z992 Dependence on renal dialysis: Secondary | ICD-10-CM | POA: Diagnosis not present

## 2022-02-11 DIAGNOSIS — N2581 Secondary hyperparathyroidism of renal origin: Secondary | ICD-10-CM | POA: Diagnosis not present

## 2022-02-11 DIAGNOSIS — D631 Anemia in chronic kidney disease: Secondary | ICD-10-CM | POA: Diagnosis not present

## 2022-02-11 DIAGNOSIS — D689 Coagulation defect, unspecified: Secondary | ICD-10-CM | POA: Diagnosis not present

## 2022-02-11 DIAGNOSIS — N179 Acute kidney failure, unspecified: Secondary | ICD-10-CM | POA: Diagnosis not present

## 2022-02-11 DIAGNOSIS — D509 Iron deficiency anemia, unspecified: Secondary | ICD-10-CM | POA: Diagnosis not present

## 2022-02-13 DIAGNOSIS — Z992 Dependence on renal dialysis: Secondary | ICD-10-CM | POA: Diagnosis not present

## 2022-02-13 DIAGNOSIS — N179 Acute kidney failure, unspecified: Secondary | ICD-10-CM | POA: Diagnosis not present

## 2022-02-13 DIAGNOSIS — R519 Headache, unspecified: Secondary | ICD-10-CM | POA: Diagnosis not present

## 2022-02-13 DIAGNOSIS — N2581 Secondary hyperparathyroidism of renal origin: Secondary | ICD-10-CM | POA: Diagnosis not present

## 2022-02-13 DIAGNOSIS — D631 Anemia in chronic kidney disease: Secondary | ICD-10-CM | POA: Diagnosis not present

## 2022-02-13 DIAGNOSIS — D509 Iron deficiency anemia, unspecified: Secondary | ICD-10-CM | POA: Diagnosis not present

## 2022-02-13 DIAGNOSIS — D689 Coagulation defect, unspecified: Secondary | ICD-10-CM | POA: Diagnosis not present

## 2022-02-14 ENCOUNTER — Ambulatory Visit (INDEPENDENT_AMBULATORY_CARE_PROVIDER_SITE_OTHER): Payer: Medicare Other | Admitting: Vascular Surgery

## 2022-02-14 ENCOUNTER — Encounter: Payer: Self-pay | Admitting: Vascular Surgery

## 2022-02-14 ENCOUNTER — Ambulatory Visit (HOSPITAL_COMMUNITY)
Admission: RE | Admit: 2022-02-14 | Discharge: 2022-02-14 | Disposition: A | Discharge status: Home / Self Care | Payer: Medicare Other | Source: Ambulatory Visit | Attending: Vascular Surgery | Admitting: Vascular Surgery

## 2022-02-14 VITALS — BP 125/57 | HR 76 | Temp 98.0°F | Resp 14 | Wt 134.0 lb

## 2022-02-14 DIAGNOSIS — N186 End stage renal disease: Secondary | ICD-10-CM | POA: Insufficient documentation

## 2022-02-14 DIAGNOSIS — N189 Chronic kidney disease, unspecified: Secondary | ICD-10-CM

## 2022-02-14 DIAGNOSIS — N179 Acute kidney failure, unspecified: Secondary | ICD-10-CM

## 2022-02-14 NOTE — Progress Notes (Signed)
Patient name: Stacey Chambers MRN: [id number] DOB: [birth year] Sex: female  REASON FOR CONSULT: Postop check  HPI: Stacey Chambers is a 71 y.o. female, who presents for postop check after left brachiocephalic AV fistula on 2/40/9735.  This was for AKI on stage IV CKD.  Left arm incision is healed.  Left hand with no signs of steal.  No other complaints today.    Past Medical History:  Diagnosis Date   Anemia    Diabetes mellitus without complication ([place])    Hypertension    Vision loss     Past Surgical History:  Procedure Laterality Date   AV FISTULA PLACEMENT Left 12/23/2021   Procedure: INSERTION OF LEFT ARM BRACHIOCEPHALIC ARTERIOVENOUS FISTULA;  Surgeon: Marty Heck, MD;  Location: [facility] OR;  Service: Vascular;  Laterality: Left;   BACK SURGERY     COLONOSCOPY     IR FLUORO GUIDE CV LINE RIGHT  12/16/2021   IR US GUIDE VASC ACCESS RIGHT  12/16/2021   UPPER GASTROINTESTINAL ENDOSCOPY      Family History  Problem Relation Age of Onset   Diabetes Brother    Colon cancer Neg Hx    Esophageal cancer Neg Hx    Stomach cancer Neg Hx    Pancreatic cancer Neg Hx    Inflammatory bowel disease Neg Hx    Liver disease Neg Hx    Rectal cancer Neg Hx    Colon polyps Neg Hx     SOCIAL HISTORY: Social History   Socioeconomic History   Marital status: Married    Spouse name: Not on file   Number of children: Not on file   Years of education: Not on file   Highest education level: Not on file  Occupational History   Not on file  Tobacco Use   Smoking status: Never   Smokeless tobacco: Never  Vaping Use   Vaping Use: Never used  Substance and Sexual Activity   Alcohol use: Never    Comment: once in awhile   Drug use: Not Currently    Types: Marijuana    Comment: Use of THC tea 1-2 times per week   Sexual activity: Not on file  Other Topics Concern   Not on file  Social History Narrative   Not on file   Social Determinants of Health   Financial  Resource Strain: Not on file  Food Insecurity: Not on file  Transportation Needs: Not on file  Physical Activity: Not on file  Stress: Not on file  Social Connections: Not on file  Intimate Partner Violence: Not on file    No Known Allergies  Current Outpatient Medications  Medication Sig Dispense Refill   ACCU-CHEK GUIDE test strip  (Patient not taking: Reported on 02/14/2022)     Accu-Chek Softclix Lancets lancets SMARTSIG:Topical (Patient not taking: Reported on 02/14/2022)     amLODipine (NORVASC) 2.5 MG tablet Take 2.5 mg by mouth daily. (Patient not taking: Reported on 02/14/2022)     atorvastatin (LIPITOR) 20 MG tablet Take 20 mg by mouth daily. (Patient not taking: Reported on 02/14/2022)     esomeprazole (NEXIUM) 40 MG capsule Take 1 capsule (40 mg total) by mouth 2 (two) times daily. (Patient not taking: Reported on 02/14/2022) 60 capsule 11   ferrous gluconate (FERGON) 324 MG tablet Take 1 tablet (324 mg total) by mouth daily with breakfast. (Patient not taking: Reported on 02/14/2022) 30 tablet 12   fluticasone (FLONASE) 50 MCG/ACT nasal spray Place 1  spray into both nostrils daily as needed for allergies. (Patient not taking: Reported on 02/14/2022)     lactulose (CHRONULAC) 10 GM/15ML solution Take 45 mLs (30 g total) by mouth 3 (three) times daily as needed for mild constipation. (Patient not taking: Reported on 02/14/2022) 4050 mL 2   levothyroxine (SYNTHROID) 25 MCG tablet Take 25 mcg by mouth daily. (Patient not taking: Reported on 02/14/2022)     metoprolol succinate (TOPROL-XL) 50 MG 24 hr tablet Take 50 mg by mouth daily. (Patient not taking: Reported on 02/14/2022)     Vitamin D, Ergocalciferol, (DRISDOL) 1.25 MG (50000 UNIT) CAPS capsule Take 50,000 Units by mouth once a week. Wednesday (Patient not taking: Reported on 02/14/2022)     No current facility-administered medications for this visit.    REVIEW OF SYSTEMS:  [X]  denotes positive finding, [ ]  denotes negative  finding Cardiac  Comments:  Chest pain or chest pressure:    Shortness of breath upon exertion:    Short of breath when lying flat:    Irregular heart rhythm:        Vascular    Pain in calf, thigh, or hip brought on by ambulation:    Pain in feet at night that wakes you up from your sleep:     Blood clot in your veins:    Leg swelling:         Pulmonary    Oxygen at home:    Productive cough:     Wheezing:         Neurologic    Sudden weakness in arms or legs:     Sudden numbness in arms or legs:     Sudden onset of difficulty speaking or slurred speech:    Temporary loss of vision in one eye:     Problems with dizziness:         Gastrointestinal    Blood in stool:     Vomited blood:         Genitourinary    Burning when urinating:     Blood in urine:        Psychiatric    Major depression:         Hematologic    Bleeding problems:    Problems with blood clotting too easily:        Skin    Rashes or ulcers:        Constitutional    Fever or chills:      PHYSICAL EXAM: Vitals:   02/14/22 1205  BP: (!) 125/57  Pulse: 76  Resp: 14  Temp: 98 F (36.7 C)  TempSrc: Temporal  SpO2: 97%  Weight: 134 lb (60.8 kg)    GENERAL: The patient is a well-nourished female, in no acute distress. The vital signs are documented above. CARDIAC: There is a regular rate and rhythm.  VASCULAR:  Right IJ TDC Left arm incision healed Left brchiocephalic AVF with excellent thrill   DATA:   Reason for Exam: Routine follow up.   Access Site: Left Upper Extremity.   Access Type: Brachial-cephalic AVF.   History: AVF placement 12/22/21.   Performing Technologist: Elta Guadeloupe RVT, RDMS      Examination Guidelines: A complete evaluation includes B-mode imaging,  spectral  Doppler, color Doppler, and power Doppler as needed of all accessible  portions  of each vessel. Unilateral testing is considered an integral part of a  complete  examination. Limited examinations  for reoccurring indications may be  performed  as noted.      Findings:  +--------------------+----------+-----------------+--------+  AVF                 PSV (cm/s)Flow Vol (mL/min)Comments  +--------------------+----------+-----------------+--------+  Native artery inflow   286          1368                 +--------------------+----------+-----------------+--------+  AVF Anastomosis        549                               +--------------------+----------+-----------------+--------+      +------------+----------+-------------+----------+--------+  OUTFLOW VEINPSV (cm/s)Diameter (cm)Depth (cm)Describe  +------------+----------+-------------+----------+--------+  Prox UA        106        0.64        0.35             +------------+----------+-------------+----------+--------+  Mid UA         200        1.00        0.17             +------------+----------+-------------+----------+--------+  Dist UA        153        1.10        0.12             +------------+----------+-------------+----------+--------+  AC Fossa       567        0.56        0.25             +------------+----------+-------------+----------+--------+          Summary:  Patent arteriovenous fistula.   Assessment/Plan:  71 year old female with AKI on stage IV CKD that presents with postop check after left brachiocephalic AV fistula on 2/67/1245.  Discussed the fistula looks great on duplex.  Excellent thrill.  No signs of steal.  She can use this when she is 3 months postop around 03/25/2022.  Discussed once this works after several sessions either nephrologist or she can contact our office to make arrangements for catheter removal.   Marty Heck, MD [facility] Office: [phone]

## 2022-02-16 DIAGNOSIS — N2581 Secondary hyperparathyroidism of renal origin: Secondary | ICD-10-CM | POA: Diagnosis not present

## 2022-02-16 DIAGNOSIS — Z992 Dependence on renal dialysis: Secondary | ICD-10-CM | POA: Diagnosis not present

## 2022-02-16 DIAGNOSIS — N179 Acute kidney failure, unspecified: Secondary | ICD-10-CM | POA: Diagnosis not present

## 2022-02-16 DIAGNOSIS — D689 Coagulation defect, unspecified: Secondary | ICD-10-CM | POA: Diagnosis not present

## 2022-02-16 DIAGNOSIS — R519 Headache, unspecified: Secondary | ICD-10-CM | POA: Diagnosis not present

## 2022-02-16 DIAGNOSIS — D509 Iron deficiency anemia, unspecified: Secondary | ICD-10-CM | POA: Diagnosis not present

## 2022-02-16 DIAGNOSIS — D631 Anemia in chronic kidney disease: Secondary | ICD-10-CM | POA: Diagnosis not present

## 2022-02-18 DIAGNOSIS — Z992 Dependence on renal dialysis: Secondary | ICD-10-CM | POA: Diagnosis not present

## 2022-02-18 DIAGNOSIS — D509 Iron deficiency anemia, unspecified: Secondary | ICD-10-CM | POA: Diagnosis not present

## 2022-02-18 DIAGNOSIS — R519 Headache, unspecified: Secondary | ICD-10-CM | POA: Diagnosis not present

## 2022-02-18 DIAGNOSIS — D631 Anemia in chronic kidney disease: Secondary | ICD-10-CM | POA: Diagnosis not present

## 2022-02-18 DIAGNOSIS — N2581 Secondary hyperparathyroidism of renal origin: Secondary | ICD-10-CM | POA: Diagnosis not present

## 2022-02-18 DIAGNOSIS — N179 Acute kidney failure, unspecified: Secondary | ICD-10-CM | POA: Diagnosis not present

## 2022-02-18 DIAGNOSIS — D689 Coagulation defect, unspecified: Secondary | ICD-10-CM | POA: Diagnosis not present

## 2022-02-21 ENCOUNTER — Encounter: Payer: Self-pay | Admitting: Gastroenterology

## 2022-02-21 DIAGNOSIS — M79604 Pain in right leg: Secondary | ICD-10-CM | POA: Diagnosis not present

## 2022-02-21 DIAGNOSIS — M549 Dorsalgia, unspecified: Secondary | ICD-10-CM | POA: Diagnosis not present

## 2022-02-23 DIAGNOSIS — D509 Iron deficiency anemia, unspecified: Secondary | ICD-10-CM | POA: Diagnosis not present

## 2022-02-23 DIAGNOSIS — D689 Coagulation defect, unspecified: Secondary | ICD-10-CM | POA: Diagnosis not present

## 2022-02-23 DIAGNOSIS — N2581 Secondary hyperparathyroidism of renal origin: Secondary | ICD-10-CM | POA: Diagnosis not present

## 2022-02-23 DIAGNOSIS — N179 Acute kidney failure, unspecified: Secondary | ICD-10-CM | POA: Diagnosis not present

## 2022-02-23 DIAGNOSIS — R519 Headache, unspecified: Secondary | ICD-10-CM | POA: Diagnosis not present

## 2022-02-23 DIAGNOSIS — Z992 Dependence on renal dialysis: Secondary | ICD-10-CM | POA: Diagnosis not present

## 2022-02-23 DIAGNOSIS — D631 Anemia in chronic kidney disease: Secondary | ICD-10-CM | POA: Diagnosis not present

## 2022-02-25 DIAGNOSIS — D689 Coagulation defect, unspecified: Secondary | ICD-10-CM | POA: Diagnosis not present

## 2022-02-25 DIAGNOSIS — N2581 Secondary hyperparathyroidism of renal origin: Secondary | ICD-10-CM | POA: Diagnosis not present

## 2022-02-25 DIAGNOSIS — N179 Acute kidney failure, unspecified: Secondary | ICD-10-CM | POA: Diagnosis not present

## 2022-02-25 DIAGNOSIS — Z992 Dependence on renal dialysis: Secondary | ICD-10-CM | POA: Diagnosis not present

## 2022-02-25 DIAGNOSIS — R519 Headache, unspecified: Secondary | ICD-10-CM | POA: Diagnosis not present

## 2022-02-25 DIAGNOSIS — D631 Anemia in chronic kidney disease: Secondary | ICD-10-CM | POA: Diagnosis not present

## 2022-02-25 DIAGNOSIS — D509 Iron deficiency anemia, unspecified: Secondary | ICD-10-CM | POA: Diagnosis not present

## 2022-02-28 DIAGNOSIS — N2581 Secondary hyperparathyroidism of renal origin: Secondary | ICD-10-CM | POA: Diagnosis not present

## 2022-02-28 DIAGNOSIS — D689 Coagulation defect, unspecified: Secondary | ICD-10-CM | POA: Diagnosis not present

## 2022-02-28 DIAGNOSIS — N179 Acute kidney failure, unspecified: Secondary | ICD-10-CM | POA: Diagnosis not present

## 2022-02-28 DIAGNOSIS — D631 Anemia in chronic kidney disease: Secondary | ICD-10-CM | POA: Diagnosis not present

## 2022-02-28 DIAGNOSIS — Z992 Dependence on renal dialysis: Secondary | ICD-10-CM | POA: Diagnosis not present

## 2022-02-28 DIAGNOSIS — D509 Iron deficiency anemia, unspecified: Secondary | ICD-10-CM | POA: Diagnosis not present

## 2022-02-28 DIAGNOSIS — R519 Headache, unspecified: Secondary | ICD-10-CM | POA: Diagnosis not present

## 2022-03-02 DIAGNOSIS — D509 Iron deficiency anemia, unspecified: Secondary | ICD-10-CM | POA: Diagnosis not present

## 2022-03-02 DIAGNOSIS — R519 Headache, unspecified: Secondary | ICD-10-CM | POA: Diagnosis not present

## 2022-03-02 DIAGNOSIS — N2581 Secondary hyperparathyroidism of renal origin: Secondary | ICD-10-CM | POA: Diagnosis not present

## 2022-03-02 DIAGNOSIS — Z992 Dependence on renal dialysis: Secondary | ICD-10-CM | POA: Diagnosis not present

## 2022-03-02 DIAGNOSIS — D631 Anemia in chronic kidney disease: Secondary | ICD-10-CM | POA: Diagnosis not present

## 2022-03-02 DIAGNOSIS — D689 Coagulation defect, unspecified: Secondary | ICD-10-CM | POA: Diagnosis not present

## 2022-03-02 DIAGNOSIS — N179 Acute kidney failure, unspecified: Secondary | ICD-10-CM | POA: Diagnosis not present

## 2022-03-04 DIAGNOSIS — N179 Acute kidney failure, unspecified: Secondary | ICD-10-CM | POA: Diagnosis not present

## 2022-03-04 DIAGNOSIS — N2581 Secondary hyperparathyroidism of renal origin: Secondary | ICD-10-CM | POA: Diagnosis not present

## 2022-03-04 DIAGNOSIS — Z992 Dependence on renal dialysis: Secondary | ICD-10-CM | POA: Diagnosis not present

## 2022-03-04 DIAGNOSIS — D689 Coagulation defect, unspecified: Secondary | ICD-10-CM | POA: Diagnosis not present

## 2022-03-04 DIAGNOSIS — D631 Anemia in chronic kidney disease: Secondary | ICD-10-CM | POA: Diagnosis not present

## 2022-03-04 DIAGNOSIS — N184 Chronic kidney disease, stage 4 (severe): Secondary | ICD-10-CM | POA: Diagnosis not present

## 2022-03-04 DIAGNOSIS — D509 Iron deficiency anemia, unspecified: Secondary | ICD-10-CM | POA: Diagnosis not present

## 2022-03-04 DIAGNOSIS — R519 Headache, unspecified: Secondary | ICD-10-CM | POA: Diagnosis not present

## 2022-03-07 DIAGNOSIS — D689 Coagulation defect, unspecified: Secondary | ICD-10-CM | POA: Diagnosis not present

## 2022-03-07 DIAGNOSIS — Z992 Dependence on renal dialysis: Secondary | ICD-10-CM | POA: Diagnosis not present

## 2022-03-07 DIAGNOSIS — D631 Anemia in chronic kidney disease: Secondary | ICD-10-CM | POA: Diagnosis not present

## 2022-03-07 DIAGNOSIS — D509 Iron deficiency anemia, unspecified: Secondary | ICD-10-CM | POA: Diagnosis not present

## 2022-03-07 DIAGNOSIS — N2581 Secondary hyperparathyroidism of renal origin: Secondary | ICD-10-CM | POA: Diagnosis not present

## 2022-03-07 DIAGNOSIS — N184 Chronic kidney disease, stage 4 (severe): Secondary | ICD-10-CM | POA: Diagnosis not present

## 2022-03-07 DIAGNOSIS — N179 Acute kidney failure, unspecified: Secondary | ICD-10-CM | POA: Diagnosis not present

## 2022-03-07 DIAGNOSIS — R519 Headache, unspecified: Secondary | ICD-10-CM | POA: Diagnosis not present

## 2022-03-09 DIAGNOSIS — D509 Iron deficiency anemia, unspecified: Secondary | ICD-10-CM | POA: Diagnosis not present

## 2022-03-09 DIAGNOSIS — N184 Chronic kidney disease, stage 4 (severe): Secondary | ICD-10-CM | POA: Diagnosis not present

## 2022-03-09 DIAGNOSIS — R519 Headache, unspecified: Secondary | ICD-10-CM | POA: Diagnosis not present

## 2022-03-09 DIAGNOSIS — N2581 Secondary hyperparathyroidism of renal origin: Secondary | ICD-10-CM | POA: Diagnosis not present

## 2022-03-09 DIAGNOSIS — Z992 Dependence on renal dialysis: Secondary | ICD-10-CM | POA: Diagnosis not present

## 2022-03-09 DIAGNOSIS — N179 Acute kidney failure, unspecified: Secondary | ICD-10-CM | POA: Diagnosis not present

## 2022-03-09 DIAGNOSIS — D631 Anemia in chronic kidney disease: Secondary | ICD-10-CM | POA: Diagnosis not present

## 2022-03-09 DIAGNOSIS — D689 Coagulation defect, unspecified: Secondary | ICD-10-CM | POA: Diagnosis not present

## 2022-03-11 DIAGNOSIS — Z992 Dependence on renal dialysis: Secondary | ICD-10-CM | POA: Diagnosis not present

## 2022-03-11 DIAGNOSIS — R519 Headache, unspecified: Secondary | ICD-10-CM | POA: Diagnosis not present

## 2022-03-11 DIAGNOSIS — N184 Chronic kidney disease, stage 4 (severe): Secondary | ICD-10-CM | POA: Diagnosis not present

## 2022-03-11 DIAGNOSIS — D509 Iron deficiency anemia, unspecified: Secondary | ICD-10-CM | POA: Diagnosis not present

## 2022-03-11 DIAGNOSIS — N2581 Secondary hyperparathyroidism of renal origin: Secondary | ICD-10-CM | POA: Diagnosis not present

## 2022-03-11 DIAGNOSIS — D689 Coagulation defect, unspecified: Secondary | ICD-10-CM | POA: Diagnosis not present

## 2022-03-11 DIAGNOSIS — D631 Anemia in chronic kidney disease: Secondary | ICD-10-CM | POA: Diagnosis not present

## 2022-03-11 DIAGNOSIS — N179 Acute kidney failure, unspecified: Secondary | ICD-10-CM | POA: Diagnosis not present

## 2022-03-14 DIAGNOSIS — D509 Iron deficiency anemia, unspecified: Secondary | ICD-10-CM | POA: Diagnosis not present

## 2022-03-14 DIAGNOSIS — D631 Anemia in chronic kidney disease: Secondary | ICD-10-CM | POA: Diagnosis not present

## 2022-03-14 DIAGNOSIS — N179 Acute kidney failure, unspecified: Secondary | ICD-10-CM | POA: Diagnosis not present

## 2022-03-14 DIAGNOSIS — D689 Coagulation defect, unspecified: Secondary | ICD-10-CM | POA: Diagnosis not present

## 2022-03-14 DIAGNOSIS — N184 Chronic kidney disease, stage 4 (severe): Secondary | ICD-10-CM | POA: Diagnosis not present

## 2022-03-14 DIAGNOSIS — N2581 Secondary hyperparathyroidism of renal origin: Secondary | ICD-10-CM | POA: Diagnosis not present

## 2022-03-14 DIAGNOSIS — Z992 Dependence on renal dialysis: Secondary | ICD-10-CM | POA: Diagnosis not present

## 2022-03-14 DIAGNOSIS — R519 Headache, unspecified: Secondary | ICD-10-CM | POA: Diagnosis not present

## 2022-03-16 DIAGNOSIS — N186 End stage renal disease: Secondary | ICD-10-CM | POA: Diagnosis not present

## 2022-03-16 DIAGNOSIS — N179 Acute kidney failure, unspecified: Secondary | ICD-10-CM | POA: Diagnosis not present

## 2022-03-16 DIAGNOSIS — D689 Coagulation defect, unspecified: Secondary | ICD-10-CM | POA: Diagnosis not present

## 2022-03-16 DIAGNOSIS — N2581 Secondary hyperparathyroidism of renal origin: Secondary | ICD-10-CM | POA: Diagnosis not present

## 2022-03-16 DIAGNOSIS — Z992 Dependence on renal dialysis: Secondary | ICD-10-CM | POA: Diagnosis not present

## 2022-03-16 DIAGNOSIS — D509 Iron deficiency anemia, unspecified: Secondary | ICD-10-CM | POA: Diagnosis not present

## 2022-03-18 DIAGNOSIS — D689 Coagulation defect, unspecified: Secondary | ICD-10-CM | POA: Diagnosis not present

## 2022-03-18 DIAGNOSIS — N2581 Secondary hyperparathyroidism of renal origin: Secondary | ICD-10-CM | POA: Diagnosis not present

## 2022-03-18 DIAGNOSIS — Z992 Dependence on renal dialysis: Secondary | ICD-10-CM | POA: Diagnosis not present

## 2022-03-18 DIAGNOSIS — D509 Iron deficiency anemia, unspecified: Secondary | ICD-10-CM | POA: Diagnosis not present

## 2022-03-18 DIAGNOSIS — N186 End stage renal disease: Secondary | ICD-10-CM | POA: Diagnosis not present

## 2022-03-20 DIAGNOSIS — M5117 Intervertebral disc disorders with radiculopathy, lumbosacral region: Secondary | ICD-10-CM | POA: Diagnosis not present

## 2022-03-21 DIAGNOSIS — N2581 Secondary hyperparathyroidism of renal origin: Secondary | ICD-10-CM | POA: Diagnosis not present

## 2022-03-21 DIAGNOSIS — D689 Coagulation defect, unspecified: Secondary | ICD-10-CM | POA: Diagnosis not present

## 2022-03-21 DIAGNOSIS — D509 Iron deficiency anemia, unspecified: Secondary | ICD-10-CM | POA: Diagnosis not present

## 2022-03-21 DIAGNOSIS — Z992 Dependence on renal dialysis: Secondary | ICD-10-CM | POA: Diagnosis not present

## 2022-03-21 DIAGNOSIS — N186 End stage renal disease: Secondary | ICD-10-CM | POA: Diagnosis not present

## 2022-03-23 DIAGNOSIS — N186 End stage renal disease: Secondary | ICD-10-CM | POA: Diagnosis not present

## 2022-03-23 DIAGNOSIS — D689 Coagulation defect, unspecified: Secondary | ICD-10-CM | POA: Diagnosis not present

## 2022-03-23 DIAGNOSIS — Z992 Dependence on renal dialysis: Secondary | ICD-10-CM | POA: Diagnosis not present

## 2022-03-23 DIAGNOSIS — D509 Iron deficiency anemia, unspecified: Secondary | ICD-10-CM | POA: Diagnosis not present

## 2022-03-23 DIAGNOSIS — N2581 Secondary hyperparathyroidism of renal origin: Secondary | ICD-10-CM | POA: Diagnosis not present

## 2022-03-25 DIAGNOSIS — N186 End stage renal disease: Secondary | ICD-10-CM | POA: Diagnosis not present

## 2022-03-25 DIAGNOSIS — Z992 Dependence on renal dialysis: Secondary | ICD-10-CM | POA: Diagnosis not present

## 2022-03-25 DIAGNOSIS — N2581 Secondary hyperparathyroidism of renal origin: Secondary | ICD-10-CM | POA: Diagnosis not present

## 2022-03-25 DIAGNOSIS — D689 Coagulation defect, unspecified: Secondary | ICD-10-CM | POA: Diagnosis not present

## 2022-03-25 DIAGNOSIS — D509 Iron deficiency anemia, unspecified: Secondary | ICD-10-CM | POA: Diagnosis not present

## 2022-03-28 DIAGNOSIS — D509 Iron deficiency anemia, unspecified: Secondary | ICD-10-CM | POA: Diagnosis not present

## 2022-03-28 DIAGNOSIS — Z992 Dependence on renal dialysis: Secondary | ICD-10-CM | POA: Diagnosis not present

## 2022-03-28 DIAGNOSIS — N2581 Secondary hyperparathyroidism of renal origin: Secondary | ICD-10-CM | POA: Diagnosis not present

## 2022-03-28 DIAGNOSIS — D689 Coagulation defect, unspecified: Secondary | ICD-10-CM | POA: Diagnosis not present

## 2022-03-28 DIAGNOSIS — N186 End stage renal disease: Secondary | ICD-10-CM | POA: Diagnosis not present

## 2022-03-30 ENCOUNTER — Encounter: Payer: Self-pay | Admitting: Emergency Medicine

## 2022-03-30 ENCOUNTER — Telehealth: Payer: Self-pay

## 2022-03-30 ENCOUNTER — Ambulatory Visit (INDEPENDENT_AMBULATORY_CARE_PROVIDER_SITE_OTHER): Payer: Medicare Other | Admitting: Emergency Medicine

## 2022-03-30 VITALS — BP 132/88 | HR 78 | Temp 98.1°F | Ht 59.0 in | Wt 141.0 lb

## 2022-03-30 DIAGNOSIS — Z7689 Persons encountering health services in other specified circumstances: Secondary | ICD-10-CM | POA: Diagnosis not present

## 2022-03-30 DIAGNOSIS — Z862 Personal history of diseases of the blood and blood-forming organs and certain disorders involving the immune mechanism: Secondary | ICD-10-CM

## 2022-03-30 DIAGNOSIS — K7460 Unspecified cirrhosis of liver: Secondary | ICD-10-CM

## 2022-03-30 DIAGNOSIS — I152 Hypertension secondary to endocrine disorders: Secondary | ICD-10-CM | POA: Diagnosis not present

## 2022-03-30 DIAGNOSIS — E1159 Type 2 diabetes mellitus with other circulatory complications: Secondary | ICD-10-CM

## 2022-03-30 DIAGNOSIS — Z992 Dependence on renal dialysis: Secondary | ICD-10-CM | POA: Diagnosis not present

## 2022-03-30 DIAGNOSIS — N186 End stage renal disease: Secondary | ICD-10-CM | POA: Diagnosis not present

## 2022-03-30 DIAGNOSIS — K769 Liver disease, unspecified: Secondary | ICD-10-CM | POA: Diagnosis not present

## 2022-03-30 LAB — LIPID PANEL
Cholesterol: 144 mg/dL (ref 0–200)
HDL: 61.6 mg/dL (ref >39.00)
LDL Cholesterol: 62 mg/dL (ref 0–99)
NonHDL: 82.75
Total CHOL/HDL Ratio: 2
Triglycerides: 106 mg/dL (ref 0.0–149.0)
VLDL: 21.2 mg/dL (ref 0.0–40.0)

## 2022-03-30 LAB — HEMOGLOBIN A1C: Hgb A1c MFr Bld: 5.1 % (ref 4.6–6.5)

## 2022-03-30 LAB — CBC WITH DIFFERENTIAL/PLATELET
Basophils Absolute: 0 10*3/uL (ref 0.0–0.1)
Basophils Relative: 1 % (ref 0.0–3.0)
Eosinophils Absolute: 0.1 10*3/uL (ref 0.0–0.7)
Eosinophils Relative: 2.8 % (ref 0.0–5.0)
HCT: 33.6 % — ABNORMAL LOW (ref 36.0–46.0)
Hemoglobin: 11.5 g/dL — ABNORMAL LOW (ref 12.0–15.0)
Lymphocytes Relative: 21.9 % (ref 12.0–46.0)
Lymphs Abs: 1 10*3/uL (ref 0.7–4.0)
MCHC: 34.2 g/dL (ref 30.0–36.0)
MCV: 98.5 fl (ref 78.0–100.0)
Monocytes Absolute: 0.6 10*3/uL (ref 0.1–1.0)
Monocytes Relative: 12.4 % — ABNORMAL HIGH (ref 3.0–12.0)
Neutro Abs: 2.7 10*3/uL (ref 1.4–7.7)
Neutrophils Relative %: 61.9 % (ref 43.0–77.0)
Platelets: 50 10*3/uL — ABNORMAL LOW (ref 150.0–400.0)
RBC: 3.41 Mil/uL — ABNORMAL LOW (ref 3.87–5.11)
RDW: 14.6 % (ref 11.5–15.5)
WBC: 4.4 10*3/uL (ref 4.0–10.5)

## 2022-03-30 LAB — COMPREHENSIVE METABOLIC PANEL
ALT: 16 U/L (ref 0–35)
AST: 19 U/L (ref 0–37)
Albumin: 3.8 g/dL (ref 3.5–5.2)
Alkaline Phosphatase: 153 U/L — ABNORMAL HIGH (ref 39–117)
BUN: 45 mg/dL — ABNORMAL HIGH (ref 6–23)
CO2: 23 mEq/L (ref 19–32)
Calcium: 8.8 mg/dL (ref 8.4–10.5)
Chloride: 103 mEq/L (ref 96–112)
Creatinine, Ser: 4.75 mg/dL (ref 0.40–1.20)
GFR: 8.9 mL/min — CL (ref >60.00)
Glucose, Bld: 101 mg/dL — ABNORMAL HIGH (ref 70–99)
Potassium: 5.1 mEq/L (ref 3.5–5.1)
Sodium: 135 mEq/L (ref 135–145)
Total Bilirubin: 0.9 mg/dL (ref 0.2–1.2)
Total Protein: 7.9 g/dL (ref 6.0–8.3)

## 2022-03-30 NOTE — Progress Notes (Signed)
Stacey Chambers 71 y.o.   Chief Complaint  Patient presents with   New Patient (Initial Visit)    No concerns     HISTORY OF PRESENT ILLNESS: This is a 71 y.o. female first visit to this office, here to establish care with me. Originally from [place]. Kaly has history of diabetes, end-stage renal disease on dialysis, liver cirrhosis. No medications at present time.  HPI   Prior to Admission medications   Medication Sig Start Date End Date Taking? Authorizing Provider  ACCU-CHEK GUIDE test strip  02/01/21   [provider]  Accu-Chek Softclix Lancets lancets  01/31/21   [provider]  amLODipine (NORVASC) 2.5 MG tablet Take 2.5 mg by mouth daily. Patient not taking: Reported on 03/30/2022 11/25/21   [provider]  atorvastatin (LIPITOR) 20 MG tablet Take 20 mg by mouth daily. Patient not taking: Reported on 03/30/2022 11/25/21   [provider]  B Complex-C-Zn-Folic Acid (DIALYVITE 063 WITH ZINC) 0.8 MG TABS Take 1 tablet by mouth at bedtime. 03/07/22   [provider]  esomeprazole (NEXIUM) 40 MG capsule Take 1 capsule (40 mg total) by mouth 2 (two) times daily. Patient not taking: Reported on 03/30/2022 09/13/21   Mansouraty, Telford Nab., MD  ferrous gluconate (FERGON) 324 MG tablet Take 1 tablet (324 mg total) by mouth daily with breakfast. Patient not taking: Reported on 03/30/2022 09/13/21   Mansouraty, Telford Nab., MD  fluticasone [facility]) 50 MCG/ACT nasal spray Place 1 spray into both nostrils daily as needed for allergies. Patient not taking: Reported on 03/30/2022 10/20/21   [provider]  levothyroxine (SYNTHROID) 25 MCG tablet Take 25 mcg by mouth daily. Patient not taking: Reported on 03/30/2022 11/17/21   [provider]  metoprolol succinate (TOPROL-XL) 50 MG 24 hr tablet Take 50 mg by mouth daily. Patient not taking: Reported on 03/30/2022 10/20/21   [provider]  sevelamer carbonate (RENVELA) 800  MG tablet Take 800 mg by mouth 3 (three) times daily. 03/09/22   [provider]  Vitamin D, Ergocalciferol, (DRISDOL) 1.25 MG (50000 UNIT) CAPS capsule Take 50,000 Units by mouth once a week. Wednesday Patient not taking: Reported on 03/30/2022 10/25/21   [provider]    No Known Allergies  Patient Active Problem List   Diagnosis Date Noted   Liver disease, chronic, with cirrhosis ([place]) 01/60/1093   Acute diastolic CHF (congestive heart failure) ([place]) 12/10/2021   Hypertensive urgency 12/10/2021   Diabetes mellitus type 2, controlled, without complications ([place]) 23/55/7322   Acute renal failure superimposed on stage 3b chronic kidney disease ([place]) 12/10/2021   Nephrotic syndrome 12/10/2021   Normocytic anemia 12/10/2021   Low serum albumin 12/10/2021   Thrombocytopenia ([place]) 12/10/2021   Cirrhosis ([place]) 12/01/2021   Swelling of lower extremity 12/01/2021   Acute kidney injury superimposed on CKD ([place]) 12/01/2021   Diabetes mellitus type 2 with complications ([place]) 02/54/2706   Abnormal LFTs 04/29/2021   Iron deficiency 04/29/2021   Gastric ulcer 04/29/2021   Splenic lesion 04/29/2021   History of gastric ulcer 03/14/2021   Elevated lipase 03/14/2021   Elevated LFTs 03/14/2021   History of thrombocytopenia 03/14/2021   History of iron deficiency anemia 03/14/2021   Macrocytosis 03/14/2021    Past Medical History:  Diagnosis Date   Anemia    Diabetes mellitus without complication ([place])    Hypertension    Vision loss     Past Surgical History:  Procedure Laterality Date   AV FISTULA PLACEMENT  Left 12/23/2021   Procedure: INSERTION OF LEFT ARM BRACHIOCEPHALIC ARTERIOVENOUS FISTULA;  Surgeon: Marty Heck, MD;  Location: [facility] OR;  Service: Vascular;  Laterality: Left;   BACK SURGERY     COLONOSCOPY     IR FLUORO GUIDE CV LINE RIGHT  12/16/2021   IR US GUIDE VASC ACCESS RIGHT  12/16/2021   UPPER GASTROINTESTINAL ENDOSCOPY      Social History    Socioeconomic History   Marital status: Married    Spouse name: Not on file   Number of children: Not on file   Years of education: Not on file   Highest education level: Not on file  Occupational History   Not on file  Tobacco Use   Smoking status: Never   Smokeless tobacco: Never  Vaping Use   Vaping Use: Never used  Substance and Sexual Activity   Alcohol use: Never    Comment: once in awhile   Drug use: Not Currently    Types: Marijuana    Comment: Use of THC tea 1-2 times per week   Sexual activity: Not on file  Other Topics Concern   Not on file  Social History Narrative   Not on file   Social Determinants of Health   Financial Resource Strain: Not on file  Food Insecurity: Not on file  Transportation Needs: Not on file  Physical Activity: Not on file  Stress: Not on file  Social Connections: Not on file  Intimate Partner Violence: Not on file    Family History  Problem Relation Age of Onset   Diabetes Brother    Colon cancer Neg Hx    Esophageal cancer Neg Hx    Stomach cancer Neg Hx    Pancreatic cancer Neg Hx    Inflammatory bowel disease Neg Hx    Liver disease Neg Hx    Rectal cancer Neg Hx    Colon polyps Neg Hx      Review of Systems  Constitutional: Negative.  Negative for chills and fever.  HENT: Negative.  Negative for congestion and sore throat.   Respiratory: Negative.  Negative for cough and shortness of breath.   Cardiovascular: Negative.  Negative for chest pain and palpitations.  Gastrointestinal: Negative.  Negative for abdominal pain, diarrhea, nausea and vomiting.  Genitourinary: Negative.  Negative for dysuria and hematuria.  Skin: Negative.  Negative for rash.  Neurological: Negative.  Negative for dizziness and headaches.  All other systems reviewed and are negative.   Today's Vitals   03/30/22 1302  BP: 132/88  Pulse: 78  Temp: 98.1 F (36.7 C)  TempSrc: Oral  SpO2: 97%  Weight: 141 lb (64 kg)  Height: 4\' 11"   (1.499 m)   Body mass index is 28.48 kg/m.  Physical Exam Vitals reviewed.  Constitutional:      Appearance: Normal appearance.  HENT:     Head: Normocephalic.     Mouth/Throat:     Mouth: Mucous membranes are moist.     Pharynx: Oropharynx is clear.  Eyes:     Extraocular Movements: Extraocular movements intact.     Conjunctiva/sclera: Conjunctivae normal.     Pupils: Pupils are equal, round, and reactive to light.  Cardiovascular:     Rate and Rhythm: Normal rate and regular rhythm.     Pulses: Normal pulses.     Heart sounds: Normal heart sounds.  Pulmonary:     Effort: Pulmonary effort is normal.     Breath sounds: Normal breath sounds.  Abdominal:  Palpations: Abdomen is soft.     Tenderness: There is no abdominal tenderness.  Musculoskeletal:        General: Normal range of motion.     Cervical back: No tenderness.  Lymphadenopathy:     Cervical: No cervical adenopathy.  Skin:    General: Skin is warm and dry.     Capillary Refill: Capillary refill takes less than 2 seconds.     Comments: Viable AV fistula left forearm Central line present on right subclavian  Neurological:     General: No focal deficit present.     Mental Status: She is alert and oriented to person, place, and time.  Psychiatric:        Mood and Affect: Mood normal.        Behavior: Behavior normal.      ASSESSMENT & PLAN: A total of 47 minutes was spent with the patient and counseling/coordination of care regarding preparing for this visit, review of available medical records, review of multiple chronic medical problems and their management, establishing care with me, review of all medications, education on nutrition, comprehensive history and physical examination, prognosis, need for blood work, documentation, need for follow-up in 4 weeks.  Problem List Items Addressed This Visit       Cardiovascular and Mediastinum   Hypertension associated with diabetes ([place]) - Primary     Well-controlled hypertension on no medications. Lab Results  Component Value Date   HGBA1C 4.9 12/02/2021  Well-controlled diabetes on no medications. Diet and nutrition discussed.       Relevant Orders   CBC with Differential/Platelet   Comprehensive metabolic panel   Hemoglobin A1c   Lipid panel     Digestive   Liver disease, chronic, with cirrhosis ([place])    Stable. Used to drink beer daily for many years.  Not anymore.        Genitourinary   End stage renal disease on dialysis [facility])    Recently started on dialysis. Stable.  No complications.  Tolerating it well.        Other   History of iron deficiency anemia   Other Visit Diagnoses     Encounter to establish care          Patient Instructions  Enfermedad renal en etapa terminal End-Stage Kidney Disease La enfermedad renal en etapa terminal ocurre cuando los riones estn tan daados que dejan de funcionar y ya no tienen mejora. Esta enfermedad se conoce como enfermedad renal en etapa terminal. Los riones son dos rganos que desempean muchas funciones importantes en el cuerpo, que incluyen las siguientes: Eliminar desechos y el exceso de lquido de la sangre para producir orina. Producir hormonas que [employer] cantidad de lquido [employer] tejidos y los vasos sanguneos. Mantener la cantidad correcta de lquidos y de sustancias qumicas en el cuerpo. Sin un buen funcionamiento de los riones, las toxinas se Therapist, art sangre y pueden ocurrir complicaciones que ponen en riesgo la vida. Cules son las causas? Esta enfermedad generalmente ocurre cuando una enfermedad renal a largo plazo (crnica) empeora y provoca un dao permanente en los riones. Tambin puede originarse por un dao repentino en los riones (lesin renal aguda). Qu incrementa el riesgo? Los siguientes factores pueden hacer que sea ms propenso a Armed forces training and education officer afeccin: Tener antecedentes familiares de enfermedad renal crnica  (ERC). Otras afecciones mdicas crnicas que afectan los riones, tales como: Enfermedad cardiovascular, incluida hipertensin arterial. Diabetes. Ciertas enfermedades que afectan el sistema de defensa del organismo contra  enfermedades (sistemainmunitario). Fumar o tener antecedentes de tabaquismo. Abuso de medicamentos de venta libre para Glass blower/designer. Estar cerca de sustancias venenosas (txicas) o entrar en contacto con ellas. Cules son los signos o sntomas? Los sntomas de esta afeccin incluyen: Hinchazn de la cara, las piernas, los tobillos o los pies. Adormecimiento, hormigueo o prdida de la sensibilidad en las manos o los pies. Cansancio (letargo). Nuseas o vmitos. Confusin, dificultad para concentrarse o prdida de la conciencia. Dolor de pecho. Falta de aire. Orinar poco o no orinar. Contracciones y calambres musculares, especialmente en las piernas. Cambios en la piel, por ejemplo: Piel seca y que pica. Piel plida debido a la anemia, lo que incluye la piel y el tejido alrededor del ojo (conjuntiva). La piel se oscurece o se aclara de manera anormal. Prdida del apetito. Dolores de [place]. Disminucin de la masa muscular (atrofia muscular). Moretones que aparecen con facilidad. Interrupcin de los perodos [employer]. Movimientos espasmdicos (convulsiones). Cmo se diagnostica? Esta afeccin se puede diagnosticar en funcin de lo siguiente: Un examen fsico, lo que incluye mediciones de la presin arterial. Anlisis de orina. Anlisis de [place]. Estudios de diagnstico por imgenes. Un estudio en el que se toma una muestra de tejido de los riones para examinarla con un microscopio (biopsia de rin). Cmo se trata? El tratamiento de esta afeccin puede incluir: Dilisis, que es un procedimiento que elimina los desechos txicos del organismo. Hay dos tipos de dilisis: La dilisis que se realiza a travs del abdomen. Esta se llama  dilisis peritoneal y puede hacerse [place]. La dilisis que se hace por medio de [place]. Esta se llama hemodilisis y puede hacerse varias veces a la semana. Trasplante de rin. Se trata de una ciruga para recibir un nuevo rin. Adems de la dilisis o de un trasplante de rin, tal vez deba tomar medicamentos: Para controlar la presin arterial elevada (hipertensin). Para controlar el colesterol. Para tratar la diabetes. Para mantener un nivel saludable de minerales en la sangre (electrolitos). Tambin pueden indicarle que siga una dieta especfica que incluye requerimientos o lmites para lo siguiente: Sal (sodio). Protena. Fsforo. [place]. Calcio. Siga estas instrucciones en su casa: Medicamentos Use los medicamentos de venta libre y los recetados solamente como se lo haya indicado el mdico. No tome ningn medicamento, vitamina o suplemento mineral nuevo, a menos que lo haya autorizado el mdico. Muchos medicamentos y suplementos pueden empeorar el dao renal. Siga las indicaciones del mdico en cuanto al ajuste de la dosis de cualquier medicamento que tome. Estilo de vida No consuma ningn producto que contenga nicotina o tabaco, como cigarrillos, cigarrillos electrnicos y tabaco de Higher education careers adviser. Si necesita ayuda para dejar de consumir estos productos, consulte al [employer]. Doctor, hospital y [place] un peso saludable. Si necesita ayuda para lograrlo, consulte a su mdico. Comience o contine un plan de ejercicios. Haga ejercicios al menos 30 minutos al da, 5 das a la [place]. Siga el plan alimenticio prescrito. Indicaciones generales Est al da con las vacunas (inmunizaciones) como se lo haya indicado el mdico. Lleve un control de la presin arterial. Informe los cambios en la presin arterial como se lo haya indicado el mdico. Si recibe tratamiento para la diabetes, controle y Recruitment consultant un seguimiento de los niveles de Location manager en la sangre (glucemia) como se lo haya  indicado el mdico. Cumpla con todas las visitas de seguimiento. Esto es importante. Dnde buscar ms informacin American Association of Kidney Patients (Asociacin Americana de Pacientes Renales): www.aakp.org  [place] ([place]): www.kidney.[place] ([place] para Problemas Renales): https://mathis.com/ Life Options Rehabilitation Program (Programa de Rehabilitacin de Life Options): www.lifeoptions.org Kidney School: www.kidneyschool.org Comunquese con un mdico si: Sus sntomas empeoran. Tiene nuevos sntomas. Solicite ayuda de inmediato si: Siente debilidad en un brazo o una pierna de un lado del cuerpo. Tiene dificultad para hablar o habla arrastrando las palabras. Tiene un cambio repentino en la visin. Tiene un dolor de cabeza repentino e intenso. Aumenta repentinamente de peso. Tiene dificultad para respirar. Los sntomas empeoran repentinamente. Siente dolor en el pecho. Resumen La enfermedad renal en etapa terminal ocurre cuando los riones estn tan daados que dejan de funcionar y ya no tienen mejora. Sin un buen funcionamiento de los riones, las toxinas se Therapist, art sangre y pueden ocurrir complicaciones que ponen en riesgo la vida. El tratamiento puede incluir dilisis o un trasplante de rin adems de medicamentos y cambios en el estilo de vida. Esta informacin no tiene Marine scientist el consejo del mdico. Asegrese de hacerle al mdico cualquier pregunta que tenga. Document Revised: 12/11/2019 Document Reviewed: 12/11/2019 Elsevier Patient Education  [place], MD [facility]

## 2022-03-30 NOTE — Assessment & Plan Note (Signed)
Well-controlled hypertension on no medications. Lab Results  Component Value Date   HGBA1C 4.9 12/02/2021  Well-controlled diabetes on no medications. Diet and nutrition discussed.

## 2022-03-30 NOTE — Telephone Encounter (Signed)
Thank you.  She is a dialysis patient.

## 2022-03-30 NOTE — Assessment & Plan Note (Signed)
Stable. Used to drink beer daily for many years.  Not anymore.

## 2022-03-30 NOTE — Assessment & Plan Note (Signed)
Recently started on dialysis. Stable.  No complications.  Tolerating it well.

## 2022-03-30 NOTE — Patient Instructions (Signed)
Enfermedad renal en etapa terminal End-Stage Kidney Disease La enfermedad renal en etapa terminal ocurre cuando los riones estn tan daados que dejan de funcionar y ya no tienen mejora. Esta enfermedad se conoce como enfermedad renal en etapa terminal. Los riones son dos rganos que desempean muchas funciones importantes en el cuerpo, que incluyen las siguientes: Eliminar desechos y el exceso de lquido de la sangre para producir orina. Producir hormonas que [employer] cantidad de lquido [employer] tejidos y los vasos sanguneos. Mantener la cantidad correcta de lquidos y de sustancias qumicas en el cuerpo. Sin un buen funcionamiento de los riones, las toxinas se Therapist, art sangre y pueden ocurrir complicaciones que ponen en riesgo la vida. Cules son las causas? Esta enfermedad generalmente ocurre cuando una enfermedad renal a largo plazo (crnica) empeora y provoca un dao permanente en los riones. Tambin puede originarse por un dao repentino en los riones (lesin renal aguda). Qu incrementa el riesgo? Los siguientes factores pueden hacer que sea ms propenso a Armed forces training and education officer afeccin: Tener antecedentes familiares de enfermedad renal crnica (ERC). Otras afecciones mdicas crnicas que afectan los riones, tales como: Enfermedad cardiovascular, incluida hipertensin arterial. Diabetes. Ciertas enfermedades que afectan el sistema de defensa del organismo contra enfermedades (sistemainmunitario). Fumar o tener antecedentes de tabaquismo. Abuso de medicamentos de venta libre para Glass blower/designer. Estar cerca de sustancias venenosas (txicas) o entrar en contacto con ellas. Cules son los signos o sntomas? Los sntomas de esta afeccin incluyen: Hinchazn de la cara, las piernas, los tobillos o los pies. Adormecimiento, hormigueo o prdida de la sensibilidad en las manos o los pies. Cansancio (letargo). Nuseas o vmitos. Confusin, dificultad para concentrarse o prdida  de la conciencia. Dolor de pecho. Falta de aire. Orinar poco o no orinar. Contracciones y calambres musculares, especialmente en las piernas. Cambios en la piel, por ejemplo: Piel seca y que pica. Piel plida debido a la anemia, lo que incluye la piel y el tejido alrededor del ojo (conjuntiva). La piel se oscurece o se aclara de manera anormal. Prdida del apetito. Dolores de [place]. Disminucin de la masa muscular (atrofia muscular). Moretones que aparecen con facilidad. Interrupcin de los perodos [employer]. Movimientos espasmdicos (convulsiones). Cmo se diagnostica? Esta afeccin se puede diagnosticar en funcin de lo siguiente: Un examen fsico, lo que incluye mediciones de la presin arterial. Anlisis de orina. Anlisis de [place]. Estudios de diagnstico por imgenes. Un estudio en el que se toma una muestra de tejido de los riones para examinarla con un microscopio (biopsia de rin). Cmo se trata? El tratamiento de esta afeccin puede incluir: Dilisis, que es un procedimiento que elimina los desechos txicos del organismo. Hay dos tipos de dilisis: La dilisis que se realiza a travs del abdomen. Esta se llama dilisis peritoneal y puede hacerse [place]. La dilisis que se hace por medio de [place]. Esta se llama hemodilisis y puede hacerse varias veces a la semana. Trasplante de rin. Se trata de una ciruga para recibir un nuevo rin. Adems de la dilisis o de un trasplante de rin, tal vez deba tomar medicamentos: Para controlar la presin arterial elevada (hipertensin). Para controlar el colesterol. Para tratar la diabetes. Para mantener un nivel saludable de minerales en la sangre (electrolitos). Tambin pueden indicarle que siga una dieta especfica que incluye requerimientos o lmites para lo siguiente: Sal (sodio). Protena. Fsforo. [place]. Calcio. Siga estas instrucciones en su casa: Medicamentos Use los  medicamentos de venta libre y los recetados  solamente como se lo haya indicado el mdico. No tome ningn medicamento, vitamina o suplemento mineral nuevo, a menos que lo haya autorizado el mdico. Muchos medicamentos y suplementos pueden empeorar el dao renal. Siga las indicaciones del mdico en cuanto al ajuste de la dosis de cualquier medicamento que tome. Estilo de vida No consuma ningn producto que contenga nicotina o tabaco, como cigarrillos, cigarrillos electrnicos y tabaco de Higher education careers adviser. Si necesita ayuda para dejar de consumir estos productos, consulte al [employer]. Doctor, hospital y [place] un peso saludable. Si necesita ayuda para lograrlo, consulte a su mdico. Comience o contine un plan de ejercicios. Haga ejercicios al menos 30 minutos al da, 5 das a la [place]. Siga el plan alimenticio prescrito. Indicaciones generales Est al da con las vacunas (inmunizaciones) como se lo haya indicado el mdico. Lleve un control de la presin arterial. Informe los cambios en la presin arterial como se lo haya indicado el mdico. Si recibe tratamiento para la diabetes, controle y Recruitment consultant un seguimiento de los niveles de Location manager en la sangre (glucemia) como se lo haya indicado el mdico. Cumpla con todas las visitas de seguimiento. Esto es importante. Dnde buscar ms informacin American Association of Kidney Patients (Asociacin Americana de Pacientes Renales): BombTimer.gl National Kidney Foundation ([place]): www.kidney.[place] ([place] para Problemas Renales): https://mathis.com/ Life Options Rehabilitation Program (Programa de Rehabilitacin de Life Options): www.lifeoptions.org Kidney School: www.kidneyschool.org Comunquese con un mdico si: Sus sntomas empeoran. Tiene nuevos sntomas. Solicite ayuda de inmediato si: Siente debilidad en un brazo o una pierna de un lado del cuerpo. Tiene dificultad para hablar o habla arrastrando las palabras. Tiene un  cambio repentino en la visin. Tiene un dolor de cabeza repentino e intenso. Aumenta repentinamente de peso. Tiene dificultad para respirar. Los sntomas empeoran repentinamente. Siente dolor en el pecho. Resumen La enfermedad renal en etapa terminal ocurre cuando los riones estn tan daados que dejan de funcionar y ya no tienen mejora. Sin un buen funcionamiento de los riones, las toxinas se Therapist, art sangre y pueden ocurrir complicaciones que ponen en riesgo la vida. El tratamiento puede incluir dilisis o un trasplante de rin adems de medicamentos y cambios en el estilo de vida. Esta informacin no tiene Marine scientist el consejo del mdico. Asegrese de hacerle al mdico cualquier pregunta que tenga. Document Revised: 12/11/2019 Document Reviewed: 12/11/2019 Elsevier Patient Education  [place].

## 2022-04-01 DIAGNOSIS — N2581 Secondary hyperparathyroidism of renal origin: Secondary | ICD-10-CM | POA: Diagnosis not present

## 2022-04-01 DIAGNOSIS — Z992 Dependence on renal dialysis: Secondary | ICD-10-CM | POA: Diagnosis not present

## 2022-04-01 DIAGNOSIS — D509 Iron deficiency anemia, unspecified: Secondary | ICD-10-CM | POA: Diagnosis not present

## 2022-04-01 DIAGNOSIS — D689 Coagulation defect, unspecified: Secondary | ICD-10-CM | POA: Diagnosis not present

## 2022-04-01 DIAGNOSIS — N186 End stage renal disease: Secondary | ICD-10-CM | POA: Diagnosis not present

## 2022-04-04 DIAGNOSIS — N2581 Secondary hyperparathyroidism of renal origin: Secondary | ICD-10-CM | POA: Diagnosis not present

## 2022-04-04 DIAGNOSIS — N186 End stage renal disease: Secondary | ICD-10-CM | POA: Diagnosis not present

## 2022-04-04 DIAGNOSIS — R519 Headache, unspecified: Secondary | ICD-10-CM | POA: Diagnosis not present

## 2022-04-04 DIAGNOSIS — D689 Coagulation defect, unspecified: Secondary | ICD-10-CM | POA: Diagnosis not present

## 2022-04-04 DIAGNOSIS — Z992 Dependence on renal dialysis: Secondary | ICD-10-CM | POA: Diagnosis not present

## 2022-04-04 DIAGNOSIS — Z23 Encounter for immunization: Secondary | ICD-10-CM | POA: Diagnosis not present

## 2022-04-04 DIAGNOSIS — D631 Anemia in chronic kidney disease: Secondary | ICD-10-CM | POA: Diagnosis not present

## 2022-04-04 DIAGNOSIS — D509 Iron deficiency anemia, unspecified: Secondary | ICD-10-CM | POA: Diagnosis not present

## 2022-04-06 DIAGNOSIS — N186 End stage renal disease: Secondary | ICD-10-CM | POA: Diagnosis not present

## 2022-04-06 DIAGNOSIS — R519 Headache, unspecified: Secondary | ICD-10-CM | POA: Diagnosis not present

## 2022-04-06 DIAGNOSIS — D689 Coagulation defect, unspecified: Secondary | ICD-10-CM | POA: Diagnosis not present

## 2022-04-06 DIAGNOSIS — Z992 Dependence on renal dialysis: Secondary | ICD-10-CM | POA: Diagnosis not present

## 2022-04-06 DIAGNOSIS — D631 Anemia in chronic kidney disease: Secondary | ICD-10-CM | POA: Diagnosis not present

## 2022-04-06 DIAGNOSIS — N2581 Secondary hyperparathyroidism of renal origin: Secondary | ICD-10-CM | POA: Diagnosis not present

## 2022-04-06 DIAGNOSIS — D509 Iron deficiency anemia, unspecified: Secondary | ICD-10-CM | POA: Diagnosis not present

## 2022-04-06 DIAGNOSIS — Z23 Encounter for immunization: Secondary | ICD-10-CM | POA: Diagnosis not present

## 2022-04-08 DIAGNOSIS — Z23 Encounter for immunization: Secondary | ICD-10-CM | POA: Diagnosis not present

## 2022-04-08 DIAGNOSIS — D689 Coagulation defect, unspecified: Secondary | ICD-10-CM | POA: Diagnosis not present

## 2022-04-08 DIAGNOSIS — Z992 Dependence on renal dialysis: Secondary | ICD-10-CM | POA: Diagnosis not present

## 2022-04-08 DIAGNOSIS — D631 Anemia in chronic kidney disease: Secondary | ICD-10-CM | POA: Diagnosis not present

## 2022-04-08 DIAGNOSIS — N2581 Secondary hyperparathyroidism of renal origin: Secondary | ICD-10-CM | POA: Diagnosis not present

## 2022-04-08 DIAGNOSIS — R519 Headache, unspecified: Secondary | ICD-10-CM | POA: Diagnosis not present

## 2022-04-08 DIAGNOSIS — D509 Iron deficiency anemia, unspecified: Secondary | ICD-10-CM | POA: Diagnosis not present

## 2022-04-08 DIAGNOSIS — N186 End stage renal disease: Secondary | ICD-10-CM | POA: Diagnosis not present

## 2022-04-11 DIAGNOSIS — D631 Anemia in chronic kidney disease: Secondary | ICD-10-CM | POA: Diagnosis not present

## 2022-04-11 DIAGNOSIS — N2581 Secondary hyperparathyroidism of renal origin: Secondary | ICD-10-CM | POA: Diagnosis not present

## 2022-04-11 DIAGNOSIS — D689 Coagulation defect, unspecified: Secondary | ICD-10-CM | POA: Diagnosis not present

## 2022-04-11 DIAGNOSIS — Z992 Dependence on renal dialysis: Secondary | ICD-10-CM | POA: Diagnosis not present

## 2022-04-11 DIAGNOSIS — D509 Iron deficiency anemia, unspecified: Secondary | ICD-10-CM | POA: Diagnosis not present

## 2022-04-11 DIAGNOSIS — Z23 Encounter for immunization: Secondary | ICD-10-CM | POA: Diagnosis not present

## 2022-04-11 DIAGNOSIS — R519 Headache, unspecified: Secondary | ICD-10-CM | POA: Diagnosis not present

## 2022-04-11 DIAGNOSIS — N186 End stage renal disease: Secondary | ICD-10-CM | POA: Diagnosis not present

## 2022-04-13 DIAGNOSIS — D631 Anemia in chronic kidney disease: Secondary | ICD-10-CM | POA: Diagnosis not present

## 2022-04-13 DIAGNOSIS — N186 End stage renal disease: Secondary | ICD-10-CM | POA: Diagnosis not present

## 2022-04-13 DIAGNOSIS — N2581 Secondary hyperparathyroidism of renal origin: Secondary | ICD-10-CM | POA: Diagnosis not present

## 2022-04-13 DIAGNOSIS — D509 Iron deficiency anemia, unspecified: Secondary | ICD-10-CM | POA: Diagnosis not present

## 2022-04-13 DIAGNOSIS — Z992 Dependence on renal dialysis: Secondary | ICD-10-CM | POA: Diagnosis not present

## 2022-04-13 DIAGNOSIS — D689 Coagulation defect, unspecified: Secondary | ICD-10-CM | POA: Diagnosis not present

## 2022-04-13 DIAGNOSIS — R519 Headache, unspecified: Secondary | ICD-10-CM | POA: Diagnosis not present

## 2022-04-13 DIAGNOSIS — Z23 Encounter for immunization: Secondary | ICD-10-CM | POA: Diagnosis not present

## 2022-04-15 DIAGNOSIS — D509 Iron deficiency anemia, unspecified: Secondary | ICD-10-CM | POA: Diagnosis not present

## 2022-04-15 DIAGNOSIS — D631 Anemia in chronic kidney disease: Secondary | ICD-10-CM | POA: Diagnosis not present

## 2022-04-15 DIAGNOSIS — Z23 Encounter for immunization: Secondary | ICD-10-CM | POA: Diagnosis not present

## 2022-04-15 DIAGNOSIS — Z992 Dependence on renal dialysis: Secondary | ICD-10-CM | POA: Diagnosis not present

## 2022-04-15 DIAGNOSIS — D689 Coagulation defect, unspecified: Secondary | ICD-10-CM | POA: Diagnosis not present

## 2022-04-15 DIAGNOSIS — N186 End stage renal disease: Secondary | ICD-10-CM | POA: Diagnosis not present

## 2022-04-15 DIAGNOSIS — N2581 Secondary hyperparathyroidism of renal origin: Secondary | ICD-10-CM | POA: Diagnosis not present

## 2022-04-15 DIAGNOSIS — R519 Headache, unspecified: Secondary | ICD-10-CM | POA: Diagnosis not present

## 2022-04-18 DIAGNOSIS — R519 Headache, unspecified: Secondary | ICD-10-CM | POA: Diagnosis not present

## 2022-04-18 DIAGNOSIS — N2581 Secondary hyperparathyroidism of renal origin: Secondary | ICD-10-CM | POA: Diagnosis not present

## 2022-04-18 DIAGNOSIS — Z23 Encounter for immunization: Secondary | ICD-10-CM | POA: Diagnosis not present

## 2022-04-18 DIAGNOSIS — D509 Iron deficiency anemia, unspecified: Secondary | ICD-10-CM | POA: Diagnosis not present

## 2022-04-18 DIAGNOSIS — N186 End stage renal disease: Secondary | ICD-10-CM | POA: Diagnosis not present

## 2022-04-18 DIAGNOSIS — D631 Anemia in chronic kidney disease: Secondary | ICD-10-CM | POA: Diagnosis not present

## 2022-04-18 DIAGNOSIS — D689 Coagulation defect, unspecified: Secondary | ICD-10-CM | POA: Diagnosis not present

## 2022-04-18 DIAGNOSIS — Z992 Dependence on renal dialysis: Secondary | ICD-10-CM | POA: Diagnosis not present

## 2022-04-19 DIAGNOSIS — S3210XD Unspecified fracture of sacrum, subsequent encounter for fracture with routine healing: Secondary | ICD-10-CM | POA: Diagnosis not present

## 2022-04-19 DIAGNOSIS — M549 Dorsalgia, unspecified: Secondary | ICD-10-CM | POA: Diagnosis not present

## 2022-04-19 DIAGNOSIS — Z981 Arthrodesis status: Secondary | ICD-10-CM | POA: Diagnosis not present

## 2022-04-19 DIAGNOSIS — Z09 Encounter for follow-up examination after completed treatment for conditions other than malignant neoplasm: Secondary | ICD-10-CM | POA: Diagnosis not present

## 2022-04-20 DIAGNOSIS — N2581 Secondary hyperparathyroidism of renal origin: Secondary | ICD-10-CM | POA: Diagnosis not present

## 2022-04-20 DIAGNOSIS — N186 End stage renal disease: Secondary | ICD-10-CM | POA: Diagnosis not present

## 2022-04-20 DIAGNOSIS — Z23 Encounter for immunization: Secondary | ICD-10-CM | POA: Diagnosis not present

## 2022-04-20 DIAGNOSIS — D631 Anemia in chronic kidney disease: Secondary | ICD-10-CM | POA: Diagnosis not present

## 2022-04-20 DIAGNOSIS — Z992 Dependence on renal dialysis: Secondary | ICD-10-CM | POA: Diagnosis not present

## 2022-04-20 DIAGNOSIS — D509 Iron deficiency anemia, unspecified: Secondary | ICD-10-CM | POA: Diagnosis not present

## 2022-04-20 DIAGNOSIS — R519 Headache, unspecified: Secondary | ICD-10-CM | POA: Diagnosis not present

## 2022-04-20 DIAGNOSIS — D689 Coagulation defect, unspecified: Secondary | ICD-10-CM | POA: Diagnosis not present

## 2022-04-22 DIAGNOSIS — D689 Coagulation defect, unspecified: Secondary | ICD-10-CM | POA: Diagnosis not present

## 2022-04-22 DIAGNOSIS — N186 End stage renal disease: Secondary | ICD-10-CM | POA: Diagnosis not present

## 2022-04-22 DIAGNOSIS — D631 Anemia in chronic kidney disease: Secondary | ICD-10-CM | POA: Diagnosis not present

## 2022-04-22 DIAGNOSIS — Z992 Dependence on renal dialysis: Secondary | ICD-10-CM | POA: Diagnosis not present

## 2022-04-22 DIAGNOSIS — R519 Headache, unspecified: Secondary | ICD-10-CM | POA: Diagnosis not present

## 2022-04-22 DIAGNOSIS — Z23 Encounter for immunization: Secondary | ICD-10-CM | POA: Diagnosis not present

## 2022-04-22 DIAGNOSIS — N2581 Secondary hyperparathyroidism of renal origin: Secondary | ICD-10-CM | POA: Diagnosis not present

## 2022-04-22 DIAGNOSIS — D509 Iron deficiency anemia, unspecified: Secondary | ICD-10-CM | POA: Diagnosis not present

## 2022-04-25 DIAGNOSIS — N186 End stage renal disease: Secondary | ICD-10-CM | POA: Diagnosis not present

## 2022-04-25 DIAGNOSIS — Z992 Dependence on renal dialysis: Secondary | ICD-10-CM | POA: Diagnosis not present

## 2022-04-25 DIAGNOSIS — D689 Coagulation defect, unspecified: Secondary | ICD-10-CM | POA: Diagnosis not present

## 2022-04-25 DIAGNOSIS — D509 Iron deficiency anemia, unspecified: Secondary | ICD-10-CM | POA: Diagnosis not present

## 2022-04-25 DIAGNOSIS — D631 Anemia in chronic kidney disease: Secondary | ICD-10-CM | POA: Diagnosis not present

## 2022-04-25 DIAGNOSIS — N2581 Secondary hyperparathyroidism of renal origin: Secondary | ICD-10-CM | POA: Diagnosis not present

## 2022-04-25 DIAGNOSIS — R519 Headache, unspecified: Secondary | ICD-10-CM | POA: Diagnosis not present

## 2022-04-25 DIAGNOSIS — Z23 Encounter for immunization: Secondary | ICD-10-CM | POA: Diagnosis not present

## 2022-04-27 DIAGNOSIS — R519 Headache, unspecified: Secondary | ICD-10-CM | POA: Diagnosis not present

## 2022-04-27 DIAGNOSIS — D509 Iron deficiency anemia, unspecified: Secondary | ICD-10-CM | POA: Diagnosis not present

## 2022-04-27 DIAGNOSIS — N186 End stage renal disease: Secondary | ICD-10-CM | POA: Diagnosis not present

## 2022-04-27 DIAGNOSIS — D631 Anemia in chronic kidney disease: Secondary | ICD-10-CM | POA: Diagnosis not present

## 2022-04-27 DIAGNOSIS — D689 Coagulation defect, unspecified: Secondary | ICD-10-CM | POA: Diagnosis not present

## 2022-04-27 DIAGNOSIS — N2581 Secondary hyperparathyroidism of renal origin: Secondary | ICD-10-CM | POA: Diagnosis not present

## 2022-04-27 DIAGNOSIS — Z992 Dependence on renal dialysis: Secondary | ICD-10-CM | POA: Diagnosis not present

## 2022-04-27 DIAGNOSIS — Z23 Encounter for immunization: Secondary | ICD-10-CM | POA: Diagnosis not present

## 2022-04-28 DIAGNOSIS — M4807 Spinal stenosis, lumbosacral region: Secondary | ICD-10-CM | POA: Diagnosis not present

## 2022-04-28 DIAGNOSIS — S32010A Wedge compression fracture of first lumbar vertebra, initial encounter for closed fracture: Secondary | ICD-10-CM | POA: Diagnosis not present

## 2022-04-28 DIAGNOSIS — S3210XD Unspecified fracture of sacrum, subsequent encounter for fracture with routine healing: Secondary | ICD-10-CM | POA: Diagnosis not present

## 2022-04-28 DIAGNOSIS — M48061 Spinal stenosis, lumbar region without neurogenic claudication: Secondary | ICD-10-CM | POA: Diagnosis not present

## 2022-04-28 DIAGNOSIS — K5730 Diverticulosis of large intestine without perforation or abscess without bleeding: Secondary | ICD-10-CM | POA: Diagnosis not present

## 2022-04-29 DIAGNOSIS — R519 Headache, unspecified: Secondary | ICD-10-CM | POA: Diagnosis not present

## 2022-04-29 DIAGNOSIS — D631 Anemia in chronic kidney disease: Secondary | ICD-10-CM | POA: Diagnosis not present

## 2022-04-29 DIAGNOSIS — N186 End stage renal disease: Secondary | ICD-10-CM | POA: Diagnosis not present

## 2022-04-29 DIAGNOSIS — Z992 Dependence on renal dialysis: Secondary | ICD-10-CM | POA: Diagnosis not present

## 2022-04-29 DIAGNOSIS — D689 Coagulation defect, unspecified: Secondary | ICD-10-CM | POA: Diagnosis not present

## 2022-04-29 DIAGNOSIS — D509 Iron deficiency anemia, unspecified: Secondary | ICD-10-CM | POA: Diagnosis not present

## 2022-04-29 DIAGNOSIS — N2581 Secondary hyperparathyroidism of renal origin: Secondary | ICD-10-CM | POA: Diagnosis not present

## 2022-04-29 DIAGNOSIS — Z23 Encounter for immunization: Secondary | ICD-10-CM | POA: Diagnosis not present

## 2022-05-02 DIAGNOSIS — E1122 Type 2 diabetes mellitus with diabetic chronic kidney disease: Secondary | ICD-10-CM | POA: Diagnosis not present

## 2022-05-02 DIAGNOSIS — N2581 Secondary hyperparathyroidism of renal origin: Secondary | ICD-10-CM | POA: Diagnosis not present

## 2022-05-02 DIAGNOSIS — Z23 Encounter for immunization: Secondary | ICD-10-CM | POA: Diagnosis not present

## 2022-05-02 DIAGNOSIS — D689 Coagulation defect, unspecified: Secondary | ICD-10-CM | POA: Diagnosis not present

## 2022-05-02 DIAGNOSIS — R519 Headache, unspecified: Secondary | ICD-10-CM | POA: Diagnosis not present

## 2022-05-02 DIAGNOSIS — Z992 Dependence on renal dialysis: Secondary | ICD-10-CM | POA: Diagnosis not present

## 2022-05-02 DIAGNOSIS — D509 Iron deficiency anemia, unspecified: Secondary | ICD-10-CM | POA: Diagnosis not present

## 2022-05-02 DIAGNOSIS — N186 End stage renal disease: Secondary | ICD-10-CM | POA: Diagnosis not present

## 2022-05-02 DIAGNOSIS — D631 Anemia in chronic kidney disease: Secondary | ICD-10-CM | POA: Diagnosis not present

## 2022-05-04 DIAGNOSIS — Z23 Encounter for immunization: Secondary | ICD-10-CM | POA: Diagnosis not present

## 2022-05-04 DIAGNOSIS — N2581 Secondary hyperparathyroidism of renal origin: Secondary | ICD-10-CM | POA: Diagnosis not present

## 2022-05-04 DIAGNOSIS — N186 End stage renal disease: Secondary | ICD-10-CM | POA: Diagnosis not present

## 2022-05-04 DIAGNOSIS — D631 Anemia in chronic kidney disease: Secondary | ICD-10-CM | POA: Diagnosis not present

## 2022-05-04 DIAGNOSIS — D689 Coagulation defect, unspecified: Secondary | ICD-10-CM | POA: Diagnosis not present

## 2022-05-04 DIAGNOSIS — Z992 Dependence on renal dialysis: Secondary | ICD-10-CM | POA: Diagnosis not present

## 2022-05-06 DIAGNOSIS — D631 Anemia in chronic kidney disease: Secondary | ICD-10-CM | POA: Diagnosis not present

## 2022-05-06 DIAGNOSIS — N2581 Secondary hyperparathyroidism of renal origin: Secondary | ICD-10-CM | POA: Diagnosis not present

## 2022-05-06 DIAGNOSIS — Z23 Encounter for immunization: Secondary | ICD-10-CM | POA: Diagnosis not present

## 2022-05-06 DIAGNOSIS — Z992 Dependence on renal dialysis: Secondary | ICD-10-CM | POA: Diagnosis not present

## 2022-05-06 DIAGNOSIS — D689 Coagulation defect, unspecified: Secondary | ICD-10-CM | POA: Diagnosis not present

## 2022-05-06 DIAGNOSIS — N186 End stage renal disease: Secondary | ICD-10-CM | POA: Diagnosis not present

## 2022-05-09 DIAGNOSIS — N186 End stage renal disease: Secondary | ICD-10-CM | POA: Diagnosis not present

## 2022-05-09 DIAGNOSIS — Z23 Encounter for immunization: Secondary | ICD-10-CM | POA: Diagnosis not present

## 2022-05-09 DIAGNOSIS — N2581 Secondary hyperparathyroidism of renal origin: Secondary | ICD-10-CM | POA: Diagnosis not present

## 2022-05-09 DIAGNOSIS — D631 Anemia in chronic kidney disease: Secondary | ICD-10-CM | POA: Diagnosis not present

## 2022-05-09 DIAGNOSIS — D689 Coagulation defect, unspecified: Secondary | ICD-10-CM | POA: Diagnosis not present

## 2022-05-09 DIAGNOSIS — Z992 Dependence on renal dialysis: Secondary | ICD-10-CM | POA: Diagnosis not present

## 2022-05-11 DIAGNOSIS — N2581 Secondary hyperparathyroidism of renal origin: Secondary | ICD-10-CM | POA: Diagnosis not present

## 2022-05-11 DIAGNOSIS — D689 Coagulation defect, unspecified: Secondary | ICD-10-CM | POA: Diagnosis not present

## 2022-05-11 DIAGNOSIS — N186 End stage renal disease: Secondary | ICD-10-CM | POA: Diagnosis not present

## 2022-05-11 DIAGNOSIS — Z23 Encounter for immunization: Secondary | ICD-10-CM | POA: Diagnosis not present

## 2022-05-11 DIAGNOSIS — Z992 Dependence on renal dialysis: Secondary | ICD-10-CM | POA: Diagnosis not present

## 2022-05-11 DIAGNOSIS — D631 Anemia in chronic kidney disease: Secondary | ICD-10-CM | POA: Diagnosis not present

## 2022-05-13 DIAGNOSIS — N2581 Secondary hyperparathyroidism of renal origin: Secondary | ICD-10-CM | POA: Diagnosis not present

## 2022-05-13 DIAGNOSIS — N186 End stage renal disease: Secondary | ICD-10-CM | POA: Diagnosis not present

## 2022-05-13 DIAGNOSIS — D689 Coagulation defect, unspecified: Secondary | ICD-10-CM | POA: Diagnosis not present

## 2022-05-13 DIAGNOSIS — D631 Anemia in chronic kidney disease: Secondary | ICD-10-CM | POA: Diagnosis not present

## 2022-05-13 DIAGNOSIS — Z23 Encounter for immunization: Secondary | ICD-10-CM | POA: Diagnosis not present

## 2022-05-13 DIAGNOSIS — Z992 Dependence on renal dialysis: Secondary | ICD-10-CM | POA: Diagnosis not present

## 2022-05-15 DIAGNOSIS — T82898A Other specified complication of vascular prosthetic devices, implants and grafts, initial encounter: Secondary | ICD-10-CM | POA: Diagnosis not present

## 2022-05-15 DIAGNOSIS — N186 End stage renal disease: Secondary | ICD-10-CM | POA: Diagnosis not present

## 2022-05-15 DIAGNOSIS — Z992 Dependence on renal dialysis: Secondary | ICD-10-CM | POA: Diagnosis not present

## 2022-05-16 DIAGNOSIS — Z23 Encounter for immunization: Secondary | ICD-10-CM | POA: Diagnosis not present

## 2022-05-16 DIAGNOSIS — D631 Anemia in chronic kidney disease: Secondary | ICD-10-CM | POA: Diagnosis not present

## 2022-05-16 DIAGNOSIS — D689 Coagulation defect, unspecified: Secondary | ICD-10-CM | POA: Diagnosis not present

## 2022-05-16 DIAGNOSIS — N186 End stage renal disease: Secondary | ICD-10-CM | POA: Diagnosis not present

## 2022-05-16 DIAGNOSIS — N2581 Secondary hyperparathyroidism of renal origin: Secondary | ICD-10-CM | POA: Diagnosis not present

## 2022-05-16 DIAGNOSIS — Z992 Dependence on renal dialysis: Secondary | ICD-10-CM | POA: Diagnosis not present

## 2022-05-18 DIAGNOSIS — N2581 Secondary hyperparathyroidism of renal origin: Secondary | ICD-10-CM | POA: Diagnosis not present

## 2022-05-18 DIAGNOSIS — Z23 Encounter for immunization: Secondary | ICD-10-CM | POA: Diagnosis not present

## 2022-05-18 DIAGNOSIS — D631 Anemia in chronic kidney disease: Secondary | ICD-10-CM | POA: Diagnosis not present

## 2022-05-18 DIAGNOSIS — D689 Coagulation defect, unspecified: Secondary | ICD-10-CM | POA: Diagnosis not present

## 2022-05-18 DIAGNOSIS — N186 End stage renal disease: Secondary | ICD-10-CM | POA: Diagnosis not present

## 2022-05-18 DIAGNOSIS — Z992 Dependence on renal dialysis: Secondary | ICD-10-CM | POA: Diagnosis not present

## 2022-05-20 DIAGNOSIS — D631 Anemia in chronic kidney disease: Secondary | ICD-10-CM | POA: Diagnosis not present

## 2022-05-20 DIAGNOSIS — N2581 Secondary hyperparathyroidism of renal origin: Secondary | ICD-10-CM | POA: Diagnosis not present

## 2022-05-20 DIAGNOSIS — Z992 Dependence on renal dialysis: Secondary | ICD-10-CM | POA: Diagnosis not present

## 2022-05-20 DIAGNOSIS — Z23 Encounter for immunization: Secondary | ICD-10-CM | POA: Diagnosis not present

## 2022-05-20 DIAGNOSIS — N186 End stage renal disease: Secondary | ICD-10-CM | POA: Diagnosis not present

## 2022-05-20 DIAGNOSIS — D689 Coagulation defect, unspecified: Secondary | ICD-10-CM | POA: Diagnosis not present

## 2022-05-22 DIAGNOSIS — Z23 Encounter for immunization: Secondary | ICD-10-CM | POA: Diagnosis not present

## 2022-05-22 DIAGNOSIS — D631 Anemia in chronic kidney disease: Secondary | ICD-10-CM | POA: Diagnosis not present

## 2022-05-22 DIAGNOSIS — D689 Coagulation defect, unspecified: Secondary | ICD-10-CM | POA: Diagnosis not present

## 2022-05-22 DIAGNOSIS — Z992 Dependence on renal dialysis: Secondary | ICD-10-CM | POA: Diagnosis not present

## 2022-05-22 DIAGNOSIS — N186 End stage renal disease: Secondary | ICD-10-CM | POA: Diagnosis not present

## 2022-05-22 DIAGNOSIS — N2581 Secondary hyperparathyroidism of renal origin: Secondary | ICD-10-CM | POA: Diagnosis not present

## 2022-05-24 DIAGNOSIS — D631 Anemia in chronic kidney disease: Secondary | ICD-10-CM | POA: Diagnosis not present

## 2022-05-24 DIAGNOSIS — D689 Coagulation defect, unspecified: Secondary | ICD-10-CM | POA: Diagnosis not present

## 2022-05-24 DIAGNOSIS — N2581 Secondary hyperparathyroidism of renal origin: Secondary | ICD-10-CM | POA: Diagnosis not present

## 2022-05-24 DIAGNOSIS — Z23 Encounter for immunization: Secondary | ICD-10-CM | POA: Diagnosis not present

## 2022-05-24 DIAGNOSIS — Z992 Dependence on renal dialysis: Secondary | ICD-10-CM | POA: Diagnosis not present

## 2022-05-24 DIAGNOSIS — N186 End stage renal disease: Secondary | ICD-10-CM | POA: Diagnosis not present

## 2022-05-27 DIAGNOSIS — D631 Anemia in chronic kidney disease: Secondary | ICD-10-CM | POA: Diagnosis not present

## 2022-05-27 DIAGNOSIS — N186 End stage renal disease: Secondary | ICD-10-CM | POA: Diagnosis not present

## 2022-05-27 DIAGNOSIS — Z992 Dependence on renal dialysis: Secondary | ICD-10-CM | POA: Diagnosis not present

## 2022-05-27 DIAGNOSIS — D689 Coagulation defect, unspecified: Secondary | ICD-10-CM | POA: Diagnosis not present

## 2022-05-27 DIAGNOSIS — N2581 Secondary hyperparathyroidism of renal origin: Secondary | ICD-10-CM | POA: Diagnosis not present

## 2022-05-27 DIAGNOSIS — Z23 Encounter for immunization: Secondary | ICD-10-CM | POA: Diagnosis not present

## 2022-05-30 DIAGNOSIS — Z23 Encounter for immunization: Secondary | ICD-10-CM | POA: Diagnosis not present

## 2022-05-30 DIAGNOSIS — N2581 Secondary hyperparathyroidism of renal origin: Secondary | ICD-10-CM | POA: Diagnosis not present

## 2022-05-30 DIAGNOSIS — D689 Coagulation defect, unspecified: Secondary | ICD-10-CM | POA: Diagnosis not present

## 2022-05-30 DIAGNOSIS — Z992 Dependence on renal dialysis: Secondary | ICD-10-CM | POA: Diagnosis not present

## 2022-05-30 DIAGNOSIS — D631 Anemia in chronic kidney disease: Secondary | ICD-10-CM | POA: Diagnosis not present

## 2022-05-30 DIAGNOSIS — N186 End stage renal disease: Secondary | ICD-10-CM | POA: Diagnosis not present

## 2022-06-01 DIAGNOSIS — N2581 Secondary hyperparathyroidism of renal origin: Secondary | ICD-10-CM | POA: Diagnosis not present

## 2022-06-01 DIAGNOSIS — D689 Coagulation defect, unspecified: Secondary | ICD-10-CM | POA: Diagnosis not present

## 2022-06-01 DIAGNOSIS — Z23 Encounter for immunization: Secondary | ICD-10-CM | POA: Diagnosis not present

## 2022-06-01 DIAGNOSIS — Z992 Dependence on renal dialysis: Secondary | ICD-10-CM | POA: Diagnosis not present

## 2022-06-01 DIAGNOSIS — E1122 Type 2 diabetes mellitus with diabetic chronic kidney disease: Secondary | ICD-10-CM | POA: Diagnosis not present

## 2022-06-01 DIAGNOSIS — N186 End stage renal disease: Secondary | ICD-10-CM | POA: Diagnosis not present

## 2022-06-01 DIAGNOSIS — D631 Anemia in chronic kidney disease: Secondary | ICD-10-CM | POA: Diagnosis not present

## 2022-06-03 DIAGNOSIS — N186 End stage renal disease: Secondary | ICD-10-CM | POA: Diagnosis not present

## 2022-06-03 DIAGNOSIS — N2581 Secondary hyperparathyroidism of renal origin: Secondary | ICD-10-CM | POA: Diagnosis not present

## 2022-06-03 DIAGNOSIS — Z992 Dependence on renal dialysis: Secondary | ICD-10-CM | POA: Diagnosis not present

## 2022-06-03 DIAGNOSIS — D689 Coagulation defect, unspecified: Secondary | ICD-10-CM | POA: Diagnosis not present

## 2022-06-03 DIAGNOSIS — D631 Anemia in chronic kidney disease: Secondary | ICD-10-CM | POA: Diagnosis not present

## 2022-06-06 DIAGNOSIS — N2581 Secondary hyperparathyroidism of renal origin: Secondary | ICD-10-CM | POA: Diagnosis not present

## 2022-06-06 DIAGNOSIS — D689 Coagulation defect, unspecified: Secondary | ICD-10-CM | POA: Diagnosis not present

## 2022-06-06 DIAGNOSIS — N186 End stage renal disease: Secondary | ICD-10-CM | POA: Diagnosis not present

## 2022-06-06 DIAGNOSIS — D631 Anemia in chronic kidney disease: Secondary | ICD-10-CM | POA: Diagnosis not present

## 2022-06-06 DIAGNOSIS — Z992 Dependence on renal dialysis: Secondary | ICD-10-CM | POA: Diagnosis not present

## 2022-06-08 DIAGNOSIS — N2581 Secondary hyperparathyroidism of renal origin: Secondary | ICD-10-CM | POA: Diagnosis not present

## 2022-06-08 DIAGNOSIS — D631 Anemia in chronic kidney disease: Secondary | ICD-10-CM | POA: Diagnosis not present

## 2022-06-08 DIAGNOSIS — Z992 Dependence on renal dialysis: Secondary | ICD-10-CM | POA: Diagnosis not present

## 2022-06-08 DIAGNOSIS — D689 Coagulation defect, unspecified: Secondary | ICD-10-CM | POA: Diagnosis not present

## 2022-06-08 DIAGNOSIS — N186 End stage renal disease: Secondary | ICD-10-CM | POA: Diagnosis not present

## 2022-06-09 DIAGNOSIS — S3210XD Unspecified fracture of sacrum, subsequent encounter for fracture with routine healing: Secondary | ICD-10-CM | POA: Diagnosis not present

## 2022-06-09 DIAGNOSIS — M549 Dorsalgia, unspecified: Secondary | ICD-10-CM | POA: Diagnosis not present

## 2022-06-10 DIAGNOSIS — N186 End stage renal disease: Secondary | ICD-10-CM | POA: Diagnosis not present

## 2022-06-10 DIAGNOSIS — Z992 Dependence on renal dialysis: Secondary | ICD-10-CM | POA: Diagnosis not present

## 2022-06-10 DIAGNOSIS — D689 Coagulation defect, unspecified: Secondary | ICD-10-CM | POA: Diagnosis not present

## 2022-06-10 DIAGNOSIS — D631 Anemia in chronic kidney disease: Secondary | ICD-10-CM | POA: Diagnosis not present

## 2022-06-10 DIAGNOSIS — N2581 Secondary hyperparathyroidism of renal origin: Secondary | ICD-10-CM | POA: Diagnosis not present

## 2022-06-13 DIAGNOSIS — N2581 Secondary hyperparathyroidism of renal origin: Secondary | ICD-10-CM | POA: Diagnosis not present

## 2022-06-13 DIAGNOSIS — N186 End stage renal disease: Secondary | ICD-10-CM | POA: Diagnosis not present

## 2022-06-13 DIAGNOSIS — D689 Coagulation defect, unspecified: Secondary | ICD-10-CM | POA: Diagnosis not present

## 2022-06-13 DIAGNOSIS — Z992 Dependence on renal dialysis: Secondary | ICD-10-CM | POA: Diagnosis not present

## 2022-06-13 DIAGNOSIS — D631 Anemia in chronic kidney disease: Secondary | ICD-10-CM | POA: Diagnosis not present

## 2022-06-15 DIAGNOSIS — Z992 Dependence on renal dialysis: Secondary | ICD-10-CM | POA: Diagnosis not present

## 2022-06-15 DIAGNOSIS — D689 Coagulation defect, unspecified: Secondary | ICD-10-CM | POA: Diagnosis not present

## 2022-06-15 DIAGNOSIS — D631 Anemia in chronic kidney disease: Secondary | ICD-10-CM | POA: Diagnosis not present

## 2022-06-15 DIAGNOSIS — N2581 Secondary hyperparathyroidism of renal origin: Secondary | ICD-10-CM | POA: Diagnosis not present

## 2022-06-15 DIAGNOSIS — N186 End stage renal disease: Secondary | ICD-10-CM | POA: Diagnosis not present

## 2022-06-17 DIAGNOSIS — D631 Anemia in chronic kidney disease: Secondary | ICD-10-CM | POA: Diagnosis not present

## 2022-06-17 DIAGNOSIS — D689 Coagulation defect, unspecified: Secondary | ICD-10-CM | POA: Diagnosis not present

## 2022-06-17 DIAGNOSIS — N186 End stage renal disease: Secondary | ICD-10-CM | POA: Diagnosis not present

## 2022-06-17 DIAGNOSIS — Z992 Dependence on renal dialysis: Secondary | ICD-10-CM | POA: Diagnosis not present

## 2022-06-17 DIAGNOSIS — N2581 Secondary hyperparathyroidism of renal origin: Secondary | ICD-10-CM | POA: Diagnosis not present

## 2022-06-20 DIAGNOSIS — N2581 Secondary hyperparathyroidism of renal origin: Secondary | ICD-10-CM | POA: Diagnosis not present

## 2022-06-20 DIAGNOSIS — Z992 Dependence on renal dialysis: Secondary | ICD-10-CM | POA: Diagnosis not present

## 2022-06-20 DIAGNOSIS — D631 Anemia in chronic kidney disease: Secondary | ICD-10-CM | POA: Diagnosis not present

## 2022-06-20 DIAGNOSIS — D689 Coagulation defect, unspecified: Secondary | ICD-10-CM | POA: Diagnosis not present

## 2022-06-20 DIAGNOSIS — N186 End stage renal disease: Secondary | ICD-10-CM | POA: Diagnosis not present

## 2022-06-22 DIAGNOSIS — N186 End stage renal disease: Secondary | ICD-10-CM | POA: Diagnosis not present

## 2022-06-22 DIAGNOSIS — D689 Coagulation defect, unspecified: Secondary | ICD-10-CM | POA: Diagnosis not present

## 2022-06-22 DIAGNOSIS — D631 Anemia in chronic kidney disease: Secondary | ICD-10-CM | POA: Diagnosis not present

## 2022-06-22 DIAGNOSIS — N2581 Secondary hyperparathyroidism of renal origin: Secondary | ICD-10-CM | POA: Diagnosis not present

## 2022-06-22 DIAGNOSIS — Z992 Dependence on renal dialysis: Secondary | ICD-10-CM | POA: Diagnosis not present

## 2022-06-24 DIAGNOSIS — Z992 Dependence on renal dialysis: Secondary | ICD-10-CM | POA: Diagnosis not present

## 2022-06-24 DIAGNOSIS — D631 Anemia in chronic kidney disease: Secondary | ICD-10-CM | POA: Diagnosis not present

## 2022-06-24 DIAGNOSIS — N186 End stage renal disease: Secondary | ICD-10-CM | POA: Diagnosis not present

## 2022-06-24 DIAGNOSIS — D689 Coagulation defect, unspecified: Secondary | ICD-10-CM | POA: Diagnosis not present

## 2022-06-24 DIAGNOSIS — N2581 Secondary hyperparathyroidism of renal origin: Secondary | ICD-10-CM | POA: Diagnosis not present

## 2022-06-27 DIAGNOSIS — N2581 Secondary hyperparathyroidism of renal origin: Secondary | ICD-10-CM | POA: Diagnosis not present

## 2022-06-27 DIAGNOSIS — N186 End stage renal disease: Secondary | ICD-10-CM | POA: Diagnosis not present

## 2022-06-27 DIAGNOSIS — D689 Coagulation defect, unspecified: Secondary | ICD-10-CM | POA: Diagnosis not present

## 2022-06-27 DIAGNOSIS — D631 Anemia in chronic kidney disease: Secondary | ICD-10-CM | POA: Diagnosis not present

## 2022-06-27 DIAGNOSIS — Z992 Dependence on renal dialysis: Secondary | ICD-10-CM | POA: Diagnosis not present

## 2022-06-29 DIAGNOSIS — Z992 Dependence on renal dialysis: Secondary | ICD-10-CM | POA: Diagnosis not present

## 2022-06-29 DIAGNOSIS — D689 Coagulation defect, unspecified: Secondary | ICD-10-CM | POA: Diagnosis not present

## 2022-06-29 DIAGNOSIS — N2581 Secondary hyperparathyroidism of renal origin: Secondary | ICD-10-CM | POA: Diagnosis not present

## 2022-06-29 DIAGNOSIS — N186 End stage renal disease: Secondary | ICD-10-CM | POA: Diagnosis not present

## 2022-06-29 DIAGNOSIS — D631 Anemia in chronic kidney disease: Secondary | ICD-10-CM | POA: Diagnosis not present

## 2022-07-01 DIAGNOSIS — D689 Coagulation defect, unspecified: Secondary | ICD-10-CM | POA: Diagnosis not present

## 2022-07-01 DIAGNOSIS — N2581 Secondary hyperparathyroidism of renal origin: Secondary | ICD-10-CM | POA: Diagnosis not present

## 2022-07-01 DIAGNOSIS — N186 End stage renal disease: Secondary | ICD-10-CM | POA: Diagnosis not present

## 2022-07-01 DIAGNOSIS — D631 Anemia in chronic kidney disease: Secondary | ICD-10-CM | POA: Diagnosis not present

## 2022-07-01 DIAGNOSIS — Z992 Dependence on renal dialysis: Secondary | ICD-10-CM | POA: Diagnosis not present

## 2022-07-02 DIAGNOSIS — N186 End stage renal disease: Secondary | ICD-10-CM | POA: Diagnosis not present

## 2022-07-02 DIAGNOSIS — E1122 Type 2 diabetes mellitus with diabetic chronic kidney disease: Secondary | ICD-10-CM | POA: Diagnosis not present

## 2022-07-02 DIAGNOSIS — Z992 Dependence on renal dialysis: Secondary | ICD-10-CM | POA: Diagnosis not present

## 2022-07-04 DIAGNOSIS — Z992 Dependence on renal dialysis: Secondary | ICD-10-CM | POA: Diagnosis not present

## 2022-07-04 DIAGNOSIS — Z23 Encounter for immunization: Secondary | ICD-10-CM | POA: Diagnosis not present

## 2022-07-04 DIAGNOSIS — E1122 Type 2 diabetes mellitus with diabetic chronic kidney disease: Secondary | ICD-10-CM | POA: Diagnosis not present

## 2022-07-04 DIAGNOSIS — N186 End stage renal disease: Secondary | ICD-10-CM | POA: Diagnosis not present

## 2022-07-04 DIAGNOSIS — R519 Headache, unspecified: Secondary | ICD-10-CM | POA: Diagnosis not present

## 2022-07-04 DIAGNOSIS — N2581 Secondary hyperparathyroidism of renal origin: Secondary | ICD-10-CM | POA: Diagnosis not present

## 2022-07-04 DIAGNOSIS — D509 Iron deficiency anemia, unspecified: Secondary | ICD-10-CM | POA: Diagnosis not present

## 2022-07-04 DIAGNOSIS — D689 Coagulation defect, unspecified: Secondary | ICD-10-CM | POA: Diagnosis not present

## 2022-07-04 DIAGNOSIS — D631 Anemia in chronic kidney disease: Secondary | ICD-10-CM | POA: Diagnosis not present

## 2022-07-05 DIAGNOSIS — M961 Postlaminectomy syndrome, not elsewhere classified: Secondary | ICD-10-CM | POA: Diagnosis not present

## 2022-07-06 ENCOUNTER — Other Ambulatory Visit: Payer: Self-pay | Admitting: *Deleted

## 2022-07-06 DIAGNOSIS — Z992 Dependence on renal dialysis: Secondary | ICD-10-CM | POA: Diagnosis not present

## 2022-07-06 DIAGNOSIS — Z23 Encounter for immunization: Secondary | ICD-10-CM | POA: Diagnosis not present

## 2022-07-06 DIAGNOSIS — N186 End stage renal disease: Secondary | ICD-10-CM | POA: Diagnosis not present

## 2022-07-06 DIAGNOSIS — D631 Anemia in chronic kidney disease: Secondary | ICD-10-CM | POA: Diagnosis not present

## 2022-07-06 DIAGNOSIS — E1122 Type 2 diabetes mellitus with diabetic chronic kidney disease: Secondary | ICD-10-CM | POA: Diagnosis not present

## 2022-07-06 DIAGNOSIS — D509 Iron deficiency anemia, unspecified: Secondary | ICD-10-CM | POA: Diagnosis not present

## 2022-07-06 DIAGNOSIS — N2581 Secondary hyperparathyroidism of renal origin: Secondary | ICD-10-CM | POA: Diagnosis not present

## 2022-07-06 DIAGNOSIS — R519 Headache, unspecified: Secondary | ICD-10-CM | POA: Diagnosis not present

## 2022-07-06 DIAGNOSIS — D689 Coagulation defect, unspecified: Secondary | ICD-10-CM | POA: Diagnosis not present

## 2022-07-07 ENCOUNTER — Other Ambulatory Visit: Payer: Self-pay | Admitting: Physical Medicine & Rehabilitation

## 2022-07-07 ENCOUNTER — Telehealth: Payer: Self-pay

## 2022-07-07 ENCOUNTER — Encounter: Payer: Self-pay | Admitting: Family Medicine

## 2022-07-07 ENCOUNTER — Ambulatory Visit (INDEPENDENT_AMBULATORY_CARE_PROVIDER_SITE_OTHER): Payer: Medicare Other | Admitting: Family Medicine

## 2022-07-07 VITALS — BP 148/72 | HR 79 | Temp 97.6°F | Ht 59.0 in | Wt 141.0 lb

## 2022-07-07 DIAGNOSIS — E1159 Type 2 diabetes mellitus with other circulatory complications: Secondary | ICD-10-CM

## 2022-07-07 DIAGNOSIS — Z992 Dependence on renal dialysis: Secondary | ICD-10-CM | POA: Diagnosis not present

## 2022-07-07 DIAGNOSIS — G44309 Post-traumatic headache, unspecified, not intractable: Secondary | ICD-10-CM

## 2022-07-07 DIAGNOSIS — W19XXXA Unspecified fall, initial encounter: Secondary | ICD-10-CM

## 2022-07-07 DIAGNOSIS — N186 End stage renal disease: Secondary | ICD-10-CM

## 2022-07-07 DIAGNOSIS — I152 Hypertension secondary to endocrine disorders: Secondary | ICD-10-CM | POA: Diagnosis not present

## 2022-07-07 DIAGNOSIS — M961 Postlaminectomy syndrome, not elsewhere classified: Secondary | ICD-10-CM

## 2022-07-07 DIAGNOSIS — R42 Dizziness and giddiness: Secondary | ICD-10-CM

## 2022-07-07 DIAGNOSIS — Z748 Other problems related to care provider dependency: Secondary | ICD-10-CM | POA: Diagnosis not present

## 2022-07-07 DIAGNOSIS — H543 Unqualified visual loss, both eyes: Secondary | ICD-10-CM

## 2022-07-07 DIAGNOSIS — Z789 Other specified health status: Secondary | ICD-10-CM | POA: Diagnosis not present

## 2022-07-07 DIAGNOSIS — Z91148 Patient's other noncompliance with medication regimen for other reason: Secondary | ICD-10-CM

## 2022-07-07 LAB — CBC WITH DIFFERENTIAL/PLATELET
Basophils Absolute: 0 10*3/uL (ref 0.0–0.1)
Basophils Relative: 0.9 % (ref 0.0–3.0)
Eosinophils Absolute: 0.1 10*3/uL (ref 0.0–0.7)
Eosinophils Relative: 2.3 % (ref 0.0–5.0)
HCT: 30.7 % — ABNORMAL LOW (ref 36.0–46.0)
Hemoglobin: 10.7 g/dL — ABNORMAL LOW (ref 12.0–15.0)
Lymphocytes Relative: 20 % (ref 12.0–46.0)
Lymphs Abs: 0.9 10*3/uL (ref 0.7–4.0)
MCHC: 34.8 g/dL (ref 30.0–36.0)
MCV: 100.2 fl — ABNORMAL HIGH (ref 78.0–100.0)
Monocytes Absolute: 0.5 10*3/uL (ref 0.1–1.0)
Monocytes Relative: 11.3 % (ref 3.0–12.0)
Neutro Abs: 3 10*3/uL (ref 1.4–7.7)
Neutrophils Relative %: 65.5 % (ref 43.0–77.0)
Platelets: 55 10*3/uL — ABNORMAL LOW (ref 150.0–400.0)
RBC: 3.07 Mil/uL — ABNORMAL LOW (ref 3.87–5.11)
RDW: 15.2 % (ref 11.5–15.5)
WBC: 4.6 10*3/uL (ref 4.0–10.5)

## 2022-07-07 LAB — COMPREHENSIVE METABOLIC PANEL
ALT: 11 U/L (ref 0–35)
AST: 18 U/L (ref 0–37)
Albumin: 4 g/dL (ref 3.5–5.2)
Alkaline Phosphatase: 95 U/L (ref 39–117)
BUN: 24 mg/dL — ABNORMAL HIGH (ref 6–23)
CO2: 24 mEq/L (ref 19–32)
Calcium: 9.1 mg/dL (ref 8.4–10.5)
Chloride: 95 mEq/L — ABNORMAL LOW (ref 96–112)
Creatinine, Ser: 3.82 mg/dL — ABNORMAL HIGH (ref 0.40–1.20)
GFR: 11.54 mL/min — CL (ref >60.00)
Glucose, Bld: 106 mg/dL — ABNORMAL HIGH (ref 70–99)
Potassium: 4.4 mEq/L (ref 3.5–5.1)
Sodium: 131 mEq/L — ABNORMAL LOW (ref 135–145)
Total Bilirubin: 1 mg/dL (ref 0.2–1.2)
Total Protein: 7.9 g/dL (ref 6.0–8.3)

## 2022-07-07 LAB — TSH: TSH: 4.07 u[IU]/mL (ref 0.35–5.50)

## 2022-07-07 NOTE — Telephone Encounter (Signed)
CRITICAL VALUE STICKER  CRITICAL VALUE: GFR 11.54  RECEIVER (on-site recipient of call): Jarrett Soho  DATE & TIME NOTIFIED: 07/07/22 at 11:46 am  MESSENGER (representative from lab): Saa  MD NOTIFIED: Harland Dingwall, NP-C  TIME OF NOTIFICATION: 11:47 am

## 2022-07-07 NOTE — Telephone Encounter (Signed)
Fax to [facility]

## 2022-07-07 NOTE — Progress Notes (Signed)
Please forward her labs to her nephrologist. Her labs are fairly stable.

## 2022-07-07 NOTE — Progress Notes (Unsigned)
Subjective:     Patient ID: Stacey Chambers, female    DOB: 1951-03-12, 72 y.o.   MRN: [id number]  Chief Complaint  Patient presents with   Fall    12/27, was in her kitchen preparing dinner and started to feel dizzy and fell. She hit her left side of forehead and also had fracture of her spine and they put in screws. Thinks she might have messed up surgery as she is now having troubles moving    HPI Patient is in today for intermittent dizziness, headache and recent fall. Denies headache or dizziness today. She has ESRD and on dialysis. States she has dialysis tomorrow morning.   HTN - States her BP is high. States she has not been taking her BP medications. She also has not been taking thyroid medication. States she has issues with transportation and being able to afford her medications.   Back pain- chronic Hx of spine surgery in [place].   She saw [facility] here in [place] 2 days ago. She was prescribed Tramadol but has not picked it up.   She has a lumbar spine and CT Spine ordered for next week. She also has an appt to follow up on results.   States she is not hungry. Appetite is poor and has been for 3 months.  She has also not been sleeping well.    She is crying during the visit when I asked her if she has family living with her. States she has 2 daughters who she has not seen in years and does not know why.     Health Maintenance Due  Topic Date Due   Pneumonia Vaccine 44+ Years old (1 - PCV) Never done   FOOT EXAM  Never done   OPHTHALMOLOGY EXAM  Never done   Zoster Vaccines- Shingrix (1 of 2) Never done   MAMMOGRAM  Never done   DTaP/Tdap/Td (1 - Tdap) 11/02/2004   DEXA SCAN  Never done   Medicare Annual Wellness (AWV)  01/31/2022    Past Medical History:  Diagnosis Date   Anemia    Diabetes mellitus without complication ([place])    Hypertension    Vision loss     Past Surgical History:  Procedure Laterality Date   AV  FISTULA PLACEMENT Left 12/23/2021   Procedure: INSERTION OF LEFT ARM BRACHIOCEPHALIC ARTERIOVENOUS FISTULA;  Surgeon: Marty Heck, MD;  Location: [facility];  Service: Vascular;  Laterality: Left;   BACK SURGERY     COLONOSCOPY     IR FLUORO GUIDE CV LINE RIGHT  12/16/2021   IR US GUIDE VASC ACCESS RIGHT  12/16/2021   UPPER GASTROINTESTINAL ENDOSCOPY      Family History  Problem Relation Age of Onset   Diabetes Brother    Colon cancer Neg Hx    Esophageal cancer Neg Hx    Stomach cancer Neg Hx    Pancreatic cancer Neg Hx    Inflammatory bowel disease Neg Hx    Liver disease Neg Hx    Rectal cancer Neg Hx    Colon polyps Neg Hx     Social History   Socioeconomic History   Marital status: Married    Spouse name: Not on file   Number of children: Not on file   Years of education: Not on file   Highest education level: Not on file  Occupational History   Not on file  Tobacco Use   Smoking status: Never   Smokeless  tobacco: Never  Vaping Use   Vaping Use: Never used  Substance and Sexual Activity   Alcohol use: Never    Comment: once in awhile   Drug use: Not Currently    Types: Marijuana    Comment: Use of THC tea 1-2 times per week   Sexual activity: Not on file  Other Topics Concern   Not on file  Social History Narrative   Not on file   Social Determinants of Health   Financial Resource Strain: Not on file  Food Insecurity: Not on file  Transportation Needs: Not on file  Physical Activity: Not on file  Stress: Not on file  Social Connections: Not on file  Intimate Partner Violence: Not on file    Outpatient Medications Prior to Visit  Medication Sig Dispense Refill   ACCU-CHEK GUIDE test strip      Accu-Chek Softclix Lancets lancets      atorvastatin (LIPITOR) 20 MG tablet Take 20 mg by mouth daily.     B Complex-C-Zn-Folic Acid (DIALYVITE 149 WITH ZINC) 0.8 MG TABS Take 1 tablet by mouth at bedtime.     esomeprazole (NEXIUM) 40 MG capsule Take 1  capsule (40 mg total) by mouth 2 (two) times daily. 60 capsule 11   ferrous gluconate (FERGON) 324 MG tablet Take 1 tablet (324 mg total) by mouth daily with breakfast. 30 tablet 12   sevelamer carbonate (RENVELA) 800 MG tablet Take 800 mg by mouth 3 (three) times daily.     amLODipine (NORVASC) 2.5 MG tablet Take 2.5 mg by mouth daily. (Patient not taking: Reported on 03/30/2022)     fluticasone (FLONASE) 50 MCG/ACT nasal spray Place 1 spray into both nostrils daily as needed for allergies. (Patient not taking: Reported on 03/30/2022)     levothyroxine (SYNTHROID) 25 MCG tablet Take 25 mcg by mouth daily. (Patient not taking: Reported on 03/30/2022)     metoprolol succinate (TOPROL-XL) 50 MG 24 hr tablet Take 50 mg by mouth daily. (Patient not taking: Reported on 03/30/2022)     traMADol (ULTRAM) 50 MG tablet 1 tablet Orally 1-2 times per day for 7 days (Patient not taking: Reported on 07/07/2022)     Vitamin D, Ergocalciferol, (DRISDOL) 1.25 MG (50000 UNIT) CAPS capsule Take 50,000 Units by mouth once a week. Wednesday (Patient not taking: Reported on 03/30/2022)     No facility-administered medications prior to visit.    No Known Allergies  ROS     Objective:    Physical Exam Constitutional:      General: She is not in acute distress.    Appearance: She is not ill-appearing.  HENT:     Mouth/Throat:     Mouth: Mucous membranes are moist.  Eyes:     Extraocular Movements: Extraocular movements intact.  Cardiovascular:     Rate and Rhythm: Normal rate and regular rhythm.  Pulmonary:     Effort: Pulmonary effort is normal.     Breath sounds: Normal breath sounds.  Musculoskeletal:     Cervical back: Normal range of motion and neck supple.     Right lower leg: No edema.     Left lower leg: No edema.  Skin:    General: Skin is warm and dry.  Neurological:     General: No focal deficit present.     Mental Status: She is alert and oriented to person, place, and time.     Cranial  Nerves: No cranial nerve deficit or facial asymmetry.  Sensory: No sensory deficit.     Motor: No weakness, abnormal muscle tone or pronator drift.     Coordination: Romberg sign negative.     Gait: Gait normal.  Psychiatric:        Attention and Perception: Attention normal.        Mood and Affect: Mood normal.        Speech: Speech normal.        Behavior: Behavior normal.        Thought Content: Thought content does not include suicidal ideation.     BP (!) 148/72 (BP Location: Left Arm, Patient Position: Sitting, Cuff Size: Large)   Pulse 79   Temp 97.6 F (36.4 C) (Temporal)   Ht 4\' 11"  (1.499 m)   Wt 141 lb (64 kg) Comment: with body brace  SpO2 98%   BMI 28.48 kg/m  Wt Readings from Last 3 Encounters:  07/07/22 141 lb (64 kg)  03/30/22 141 lb (64 kg)  02/14/22 134 lb (60.8 kg)       Assessment & Plan:   Problem List Items Addressed This Visit       Cardiovascular and Mediastinum   Hypertension associated with diabetes ([place])   Relevant Orders   CBC with Differential/Platelet (Completed)   Comprehensive metabolic panel (Completed)     Genitourinary   End stage renal disease on dialysis [facility])   Other Visit Diagnoses     Dizziness    -  Primary   Relevant Orders   CBC with Differential/Platelet (Completed)   Comprehensive metabolic panel (Completed)   TSH (Completed)   Fall, initial encounter       Intermittent post-traumatic headache       Relevant Medications   traMADol (ULTRAM) 50 MG tablet   Language barrier affecting health care       Assistance needed with transportation       Noncompliance with medication regimen       Blindness of both eyes          Visit conducted using medical interpreter on stick.   Reviewed notes from PCP visit and [facility]. She has not picked up Tramadol.  No acute distress. No red flag symptoms.  She is not taking medications as prescribed. Reports difficulty getting her medication due to lack of  transportation and financial concerns.  Poor vision is an issues. Total blindness of left eye.  Wearing a back brace. No headache or dizziness today.  Follow up with PCP next week. Would most likely benefit from CCM referral.   I am having Verdis Frederickson A. Wagenaar maintain her Accu-Chek Guide, Accu-Chek Softclix Lancets, esomeprazole, ferrous gluconate, atorvastatin, Vitamin D (Ergocalciferol), fluticasone, levothyroxine, metoprolol succinate, amLODipine, DIALYVITE 800 WITH ZINC, sevelamer carbonate, and traMADol.  No orders of the defined types were placed in this encounter.

## 2022-07-08 DIAGNOSIS — R519 Headache, unspecified: Secondary | ICD-10-CM | POA: Diagnosis not present

## 2022-07-08 DIAGNOSIS — D631 Anemia in chronic kidney disease: Secondary | ICD-10-CM | POA: Diagnosis not present

## 2022-07-08 DIAGNOSIS — D509 Iron deficiency anemia, unspecified: Secondary | ICD-10-CM | POA: Diagnosis not present

## 2022-07-08 DIAGNOSIS — E1122 Type 2 diabetes mellitus with diabetic chronic kidney disease: Secondary | ICD-10-CM | POA: Diagnosis not present

## 2022-07-08 DIAGNOSIS — N186 End stage renal disease: Secondary | ICD-10-CM | POA: Diagnosis not present

## 2022-07-08 DIAGNOSIS — Z23 Encounter for immunization: Secondary | ICD-10-CM | POA: Diagnosis not present

## 2022-07-08 DIAGNOSIS — D689 Coagulation defect, unspecified: Secondary | ICD-10-CM | POA: Diagnosis not present

## 2022-07-08 DIAGNOSIS — N2581 Secondary hyperparathyroidism of renal origin: Secondary | ICD-10-CM | POA: Diagnosis not present

## 2022-07-08 DIAGNOSIS — Z992 Dependence on renal dialysis: Secondary | ICD-10-CM | POA: Diagnosis not present

## 2022-07-10 ENCOUNTER — Other Ambulatory Visit: Payer: Self-pay | Admitting: *Deleted

## 2022-07-10 ENCOUNTER — Ambulatory Visit: Payer: Medicare Other

## 2022-07-10 ENCOUNTER — Ambulatory Visit
Admission: RE | Admit: 2022-07-10 | Discharge: 2022-07-10 | Disposition: A | Discharge status: Home / Self Care | Payer: Medicare Other | Source: Ambulatory Visit | Attending: *Deleted | Admitting: *Deleted

## 2022-07-10 ENCOUNTER — Ambulatory Visit
Admission: RE | Admit: 2022-07-10 | Discharge: 2022-07-10 | Disposition: A | Discharge status: Home / Self Care | Payer: Medicare Other | Source: Ambulatory Visit | Attending: Physical Medicine & Rehabilitation | Admitting: Physical Medicine & Rehabilitation

## 2022-07-10 ENCOUNTER — Ambulatory Visit (HOSPITAL_COMMUNITY): Payer: 59 | Attending: Surgery

## 2022-07-10 DIAGNOSIS — M961 Postlaminectomy syndrome, not elsewhere classified: Secondary | ICD-10-CM

## 2022-07-10 DIAGNOSIS — M545 Low back pain, unspecified: Secondary | ICD-10-CM | POA: Diagnosis not present

## 2022-07-10 DIAGNOSIS — M48061 Spinal stenosis, lumbar region without neurogenic claudication: Secondary | ICD-10-CM | POA: Diagnosis not present

## 2022-07-10 DIAGNOSIS — S32030A Wedge compression fracture of third lumbar vertebra, initial encounter for closed fracture: Secondary | ICD-10-CM | POA: Diagnosis not present

## 2022-07-11 DIAGNOSIS — N2581 Secondary hyperparathyroidism of renal origin: Secondary | ICD-10-CM | POA: Diagnosis not present

## 2022-07-11 DIAGNOSIS — Z23 Encounter for immunization: Secondary | ICD-10-CM | POA: Diagnosis not present

## 2022-07-11 DIAGNOSIS — D509 Iron deficiency anemia, unspecified: Secondary | ICD-10-CM | POA: Diagnosis not present

## 2022-07-11 DIAGNOSIS — D631 Anemia in chronic kidney disease: Secondary | ICD-10-CM | POA: Diagnosis not present

## 2022-07-11 DIAGNOSIS — Z992 Dependence on renal dialysis: Secondary | ICD-10-CM | POA: Diagnosis not present

## 2022-07-11 DIAGNOSIS — R519 Headache, unspecified: Secondary | ICD-10-CM | POA: Diagnosis not present

## 2022-07-11 DIAGNOSIS — D689 Coagulation defect, unspecified: Secondary | ICD-10-CM | POA: Diagnosis not present

## 2022-07-11 DIAGNOSIS — N186 End stage renal disease: Secondary | ICD-10-CM | POA: Diagnosis not present

## 2022-07-11 DIAGNOSIS — E1122 Type 2 diabetes mellitus with diabetic chronic kidney disease: Secondary | ICD-10-CM | POA: Diagnosis not present

## 2022-07-12 ENCOUNTER — Encounter: Payer: Self-pay | Admitting: Emergency Medicine

## 2022-07-12 ENCOUNTER — Ambulatory Visit (INDEPENDENT_AMBULATORY_CARE_PROVIDER_SITE_OTHER): Payer: 59 | Admitting: Emergency Medicine

## 2022-07-12 VITALS — BP 132/80 | HR 79 | Temp 97.9°F | Ht 59.0 in | Wt 145.0 lb

## 2022-07-12 DIAGNOSIS — G8929 Other chronic pain: Secondary | ICD-10-CM | POA: Insufficient documentation

## 2022-07-12 DIAGNOSIS — M5451 Vertebrogenic low back pain: Secondary | ICD-10-CM | POA: Insufficient documentation

## 2022-07-12 DIAGNOSIS — I152 Hypertension secondary to endocrine disorders: Secondary | ICD-10-CM

## 2022-07-12 DIAGNOSIS — E118 Type 2 diabetes mellitus with unspecified complications: Secondary | ICD-10-CM

## 2022-07-12 DIAGNOSIS — N186 End stage renal disease: Secondary | ICD-10-CM

## 2022-07-12 DIAGNOSIS — E1159 Type 2 diabetes mellitus with other circulatory complications: Secondary | ICD-10-CM

## 2022-07-12 DIAGNOSIS — K7460 Unspecified cirrhosis of liver: Secondary | ICD-10-CM

## 2022-07-12 DIAGNOSIS — M961 Postlaminectomy syndrome, not elsewhere classified: Secondary | ICD-10-CM | POA: Insufficient documentation

## 2022-07-12 DIAGNOSIS — Z992 Dependence on renal dialysis: Secondary | ICD-10-CM | POA: Diagnosis not present

## 2022-07-12 DIAGNOSIS — M533 Sacrococcygeal disorders, not elsewhere classified: Secondary | ICD-10-CM | POA: Insufficient documentation

## 2022-07-12 LAB — POCT GLYCOSYLATED HEMOGLOBIN (HGB A1C): Hemoglobin A1C: 4.7 % (ref 4.0–5.6)

## 2022-07-12 NOTE — Assessment & Plan Note (Signed)
Well-controlled hypertension. Continue amlodipine 2.5 mg daily and metoprolol succinate 50 mg daily Well-controlled diabetes with hemoglobin A1c of 4.7 Off medications. Cardiovascular risks associated with hypertension and diabetes discussed

## 2022-07-12 NOTE — Progress Notes (Signed)
Stacey Chambers 72 y.o.   Chief Complaint  Patient presents with   Follow-up    F/u appt, no concerns     HISTORY OF PRESENT ILLNESS: This is a 72 y.o. female here for 66-month follow-up of multiple chronic medical conditions. Dialysis diabetic patient Recent fall.  Lower back pain.  Saw orthopedics recently.  Has follow-up appointment next week.  Had x-rays done.  Past medical history of back surgery. Tramadol not working for pain but Tylenol is. Has no other complaints or medical concerns today.  HPI   Prior to Admission medications   Medication Sig Start Date End Date Taking? Authorizing Provider  ACCU-CHEK GUIDE test strip  02/01/21  Yes [provider]  Accu-Chek Softclix Lancets lancets  01/31/21  Yes [provider]  amLODipine (NORVASC) 2.5 MG tablet Take 2.5 mg by mouth daily. 11/25/21  Yes [provider]  esomeprazole (NEXIUM) 40 MG capsule Take 1 capsule (40 mg total) by mouth 2 (two) times daily. 09/13/21  Yes Mansouraty, Telford Nab., MD  ferrous gluconate (FERGON) 324 MG tablet Take 1 tablet (324 mg total) by mouth daily with breakfast. 09/13/21  Yes Mansouraty, Telford Nab., MD  levothyroxine (SYNTHROID) 25 MCG tablet Take 25 mcg by mouth daily. 11/17/21  Yes [provider]  metoprolol succinate (TOPROL-XL) 50 MG 24 hr tablet Take 50 mg by mouth daily. 10/20/21  Yes [provider]  sevelamer carbonate (RENVELA) 800 MG tablet Take 800 mg by mouth 3 (three) times daily. 03/09/22  Yes [provider]  traMADol Veatrice Bourbon) 50 MG tablet  07/05/22  Yes [provider]  Vitamin D, Ergocalciferol, (DRISDOL) 1.25 MG (50000 UNIT) CAPS capsule Take 50,000 Units by mouth once a week. Wednesday 10/25/21  Yes [provider]  atorvastatin (LIPITOR) 20 MG tablet Take 20 mg by mouth daily. Patient not taking: Reported on 07/12/2022 11/25/21   [provider]  B Complex-C-Zn-Folic Acid (DIALYVITE 409 WITH ZINC) 0.8 MG  TABS Take 1 tablet by mouth at bedtime. 03/07/22   [provider]    No Known Allergies  Patient Active Problem List   Diagnosis Date Noted   Lumbar post-laminectomy syndrome 07/12/2022   Other chronic pain 07/12/2022   Sacrococcygeal disorders, not elsewhere classified 07/12/2022   Vertebrogenic low back pain 07/12/2022   Hypertension associated with diabetes ([place]) 03/30/2022   End stage renal disease on dialysis ([place]) 03/30/2022   Liver disease, chronic, with cirrhosis ([place]) 73/53/2992   Acute diastolic CHF (congestive heart failure) ([place]) 12/10/2021   Hypertensive urgency 12/10/2021   Diabetes mellitus type 2, controlled, without complications ([place]) 42/68/3419   Acute renal failure superimposed on stage 3b chronic kidney disease ([place]) 12/10/2021   Nephrotic syndrome 12/10/2021   Normocytic anemia 12/10/2021   Low serum albumin 12/10/2021   Thrombocytopenia ([place]) 12/10/2021   Cirrhosis ([place]) 12/01/2021   Swelling of lower extremity 12/01/2021   Acute kidney injury superimposed on CKD ([place]) 12/01/2021   Diabetes mellitus type 2 with complications ([place]) 62/22/9798   Abnormal LFTs 04/29/2021   Iron deficiency 04/29/2021   Gastric ulcer 04/29/2021   Splenic lesion 04/29/2021   History of gastric ulcer 03/14/2021   Elevated lipase 03/14/2021   Elevated LFTs 03/14/2021   History of thrombocytopenia 03/14/2021   History of iron deficiency anemia 03/14/2021   Macrocytosis 03/14/2021    Past Medical History:  Diagnosis Date   Anemia    Diabetes mellitus without complication ([place])    Hypertension    Vision loss  Past Surgical History:  Procedure Laterality Date   AV FISTULA PLACEMENT Left 12/23/2021   Procedure: INSERTION OF LEFT ARM BRACHIOCEPHALIC ARTERIOVENOUS FISTULA;  Surgeon: Marty Heck, MD;  Location: [facility] OR;  Service: Vascular;  Laterality: Left;   BACK SURGERY     COLONOSCOPY     IR FLUORO GUIDE CV LINE RIGHT  12/16/2021   IR US GUIDE VASC  ACCESS RIGHT  12/16/2021   UPPER GASTROINTESTINAL ENDOSCOPY      Social History   Socioeconomic History   Marital status: Married    Spouse name: Not on file   Number of children: Not on file   Years of education: Not on file   Highest education level: Not on file  Occupational History   Not on file  Tobacco Use   Smoking status: Never   Smokeless tobacco: Never  Vaping Use   Vaping Use: Never used  Substance and Sexual Activity   Alcohol use: Never    Comment: once in awhile   Drug use: Not Currently    Types: Marijuana    Comment: Use of THC tea 1-2 times per week   Sexual activity: Not on file  Other Topics Concern   Not on file  Social History Narrative   Not on file   Social Determinants of Health   Financial Resource Strain: Not on file  Food Insecurity: Not on file  Transportation Needs: Not on file  Physical Activity: Not on file  Stress: Not on file  Social Connections: Not on file  Intimate Partner Violence: Not on file    Family History  Problem Relation Age of Onset   Diabetes Brother    Colon cancer Neg Hx    Esophageal cancer Neg Hx    Stomach cancer Neg Hx    Pancreatic cancer Neg Hx    Inflammatory bowel disease Neg Hx    Liver disease Neg Hx    Rectal cancer Neg Hx    Colon polyps Neg Hx      Review of Systems  Constitutional: Negative.  Negative for chills and fever.  HENT: Negative.  Negative for congestion and sore throat.   Respiratory: Negative.  Negative for cough and shortness of breath.   Cardiovascular: Negative.  Negative for chest pain and palpitations.  Gastrointestinal:  Negative for abdominal pain, nausea and vomiting.  Skin: Negative.  Negative for rash.  Neurological: Negative.  Negative for dizziness and headaches.   Today's Vitals   07/12/22 1026  BP: 132/80  Pulse: 79  Temp: 97.9 F (36.6 C)  TempSrc: Oral  SpO2: 97%  Weight: 145 lb (65.8 kg)  Height: 4\' 11"  (1.499 m)   Body mass index is 29.29  kg/m.   Physical Exam Vitals reviewed.  Constitutional:      Appearance: Normal appearance.  HENT:     Head: Normocephalic.  Eyes:     Extraocular Movements: Extraocular movements intact.     Conjunctiva/sclera: Conjunctivae normal.  Cardiovascular:     Rate and Rhythm: Normal rate and regular rhythm.     Pulses: Normal pulses.     Heart sounds: Normal heart sounds.  Pulmonary:     Effort: Pulmonary effort is normal.     Breath sounds: Normal breath sounds.  Abdominal:     Palpations: Abdomen is soft.     Tenderness: There is no abdominal tenderness.  Musculoskeletal:     Cervical back: No tenderness.  Lymphadenopathy:     Cervical: No cervical adenopathy.  Skin:  General: Skin is warm and dry.  Neurological:     General: No focal deficit present.     Mental Status: She is alert and oriented to person, place, and time.  Psychiatric:        Mood and Affect: Mood normal.        Behavior: Behavior normal.    Results for orders placed or performed in visit on 07/12/22 (from the past 24 hour(s))  POCT HgB A1C     Status: Normal   Collection Time: 07/12/22 10:43 AM  Result Value Ref Range   Hemoglobin A1C 4.7 4.0 - 5.6 %   HbA1c POC (<> result, manual entry)     HbA1c, POC (prediabetic range)     HbA1c, POC (controlled diabetic range)       ASSESSMENT & PLAN: A total of 49 minutes was spent with the patient and counseling/coordination of care regarding preparing for this visit, review of most recent office visit notes, review of multiple chronic medical conditions under management, review of all medications, review of most recent blood work results including interpretation of today's hemoglobin A1c, cardiovascular risks associated with hypertension and diabetes, education on nutrition, prognosis, documentation and need for follow-up.  Problem List Items Addressed This Visit       Cardiovascular and Mediastinum   Hypertension associated with diabetes ([place]) - Primary     Well-controlled hypertension. Continue amlodipine 2.5 mg daily and metoprolol succinate 50 mg daily Well-controlled diabetes with hemoglobin A1c of 4.7 Off medications. Cardiovascular risks associated with hypertension and diabetes discussed        Digestive   Cirrhosis ([place])    Stable and well-controlled. No signs of GI bleed.  No signs of hepatic encephalopathy.        Endocrine   Diabetes mellitus type 2 with complications ([place])   Relevant Orders   POCT HgB A1C (Completed)     Musculoskeletal and Integument   Vertebrogenic low back pain    Pain well-controlled with Tylenol.  Recent fall triggered by dog Scheduled to follow-up with orthopedist next week        Genitourinary   End stage renal disease on dialysis ([place])    Stable.  No complications.      Patient Instructions  Mantenimiento de la salud despus de los 92 aos de edad Health Maintenance After Age 33 Despus de los 65 aos de edad, corre un riesgo mayor de Tourist information centre manager enfermedades e infecciones a Barrister's clerk, como tambin de sufrir lesiones por cadas. Las cadas son la causa principal de las fracturas de huesos y lesiones en la cabeza de personas mayores de 42 aos de edad. Recibir cuidados preventivos de forma regular puede ayudarlo a mantenerse saludable y en buen [place]. Los cuidados preventivos incluyen realizarse anlisis de forma regular y Actor en el estilo de vida segn las recomendaciones del mdico. Converse con el mdico sobre lo siguiente: Las pruebas de deteccin y los anlisis que debe Dispensing optician. Una prueba de deteccin es un estudio que se para Hydrographic surveyor la presencia de una enfermedad cuando no tiene sntomas. Un plan de dieta y ejercicios adecuado para usted. Qu debo saber sobre las pruebas de deteccin y los anlisis para prevenir cadas? Realizarse pruebas de deteccin y [employer] es la mejor manera de Hydrographic surveyor un problema de salud de forma temprana. El diagnstico y  tratamiento tempranos le brindan la mejor oportunidad de Chief Technology Officer las afecciones mdicas que son comunes despus de los 28 aos de edad. Ciertas afecciones y elecciones  de estilo de vida pueden hacer que sea ms propenso a sufrir Engineer, manufacturing. El mdico puede recomendarle lo siguiente: Controles regulares de la visin. Una visin deficiente y afecciones como las cataratas pueden hacer que sea ms propenso a sufrir Engineer, manufacturing. Si [place] lentes, asegrese de obtener una receta actualizada si su visin cambia. Revisin de medicamentos. Revise regularmente con el mdico todos los medicamentos que toma, incluidos los medicamentos de [place]. Consulte al [employer] efectos secundarios que pueden hacer que sea ms propenso a sufrir Engineer, manufacturing. Informe al mdico si alguno de los medicamentos que toma lo hace sentir mareado o somnoliento. Controles de fuerza y equilibrio. El mdico puede recomendar ciertos estudios para controlar su fuerza y equilibrio al estar de pie, al caminar o al cambiar de posicin. Examen de los pies. El dolor y Chiropractor en los pies, como tambin no utilizar el calzado [place], pueden hacer que sea ms propenso a sufrir Engineer, manufacturing. Pruebas de deteccin, que incluyen las siguientes: Pruebas de deteccin para la osteoporosis. La osteoporosis es una afeccin que hace que los huesos se tornen ms dbiles y se quiebren con ms facilidad. Pruebas de deteccin para la presin arterial. Los cambios en la presin arterial y los medicamentos para Chief Technology Officer la presin arterial pueden hacerlo sentir mareado. Prueba de deteccin de la depresin. Es ms probable que sufra una cada si tiene miedo a caerse, se siente deprimido o se siente incapaz de Patent examiner. Prueba de deteccin de consumo de alcohol. Beber demasiado alcohol puede afectar su equilibrio y puede hacer que sea ms propenso a sufrir Engineer, manufacturing. Siga estas indicaciones en su casa: Estilo de vida No beba  alcohol si: Su mdico le indica no hacerlo. Si bebe alcohol: Limite la cantidad que bebe a lo siguiente: De 0 a 1 medida por da para las mujeres. De 0 a 2 medidas por da para los hombres. Sepa cunta cantidad de alcohol hay en las bebidas que toma. En los [place], una medida equivale a una botella de cerveza de 12 oz (355 ml), un vaso de vino de 5 oz (148 ml) o un vaso de una bebida alcohlica de alta graduacin de 1 oz (44 ml). No consuma ningn producto que contenga nicotina o tabaco. Estos productos incluyen cigarrillos, tabaco para Higher education careers adviser y aparatos de vapeo, como los Psychologist, sport and exercise. Si necesita ayuda para dejar de consumir estos productos, consulte al [employer]. Actividad  Siga un programa de ejercicio regular para mantenerse en forma. Esto lo ayudar a Contractor equilibrio. Consulte al mdico qu tipos de ejercicios son adecuados para usted. Si necesita un bastn o un andador, selo segn las recomendaciones del mdico. Utilice calzado con buen apoyo y suela antideslizante. Seguridad  Retire los [place] puedan causar tropiezos tales como alfombras, cables u obstculos. Instale equipos de seguridad, como barras para sostn en los baos y barandas de seguridad en las escaleras. Corn habitaciones y los pasillos bien iluminados. Indicaciones generales Hable con el mdico sobre sus riesgos de sufrir una cada. Infrmele a su mdico si: Se cae. Asegrese de informarle a su mdico acerca de todas las cadas, incluso aquellas que parecen ser [employer] Se siente mareado, cansado (tiene fatiga) o siente que pierde el equilibrio. Use los medicamentos de venta libre y los recetados solamente como se lo haya indicado el mdico. Estos incluyen suplementos. Siga una dieta sana y [place] un peso saludable. Una dieta saludable incluye productos lcteos descremados, carnes bajas en contenido  de grasa (Dorothea Glassman), fibra de granos enteros, frijoles y [place] frutas y  verduras. [place]. Realcese los estudios de rutina de la salud, dentales y de Public librarian. Resumen Tener un estilo de vida saludable y recibir cuidados preventivos pueden ayudar a Theatre stage manager salud y el bienestar despus de los 61 aos de [place]. Realizarse pruebas de deteccin y [employer] es la mejor manera de Hydrographic surveyor un problema de salud de forma temprana y Lourena Simmonds a Product/process development scientist una cada. El diagnstico y tratamiento tempranos le brindan la mejor oportunidad de Chief Technology Officer las afecciones mdicas ms comunes en las personas mayores de 8 aos de edad. Las cadas son la causa principal de las fracturas de huesos y lesiones en la cabeza de personas mayores de 58 aos de edad. Tome precauciones para evitar una cada en su casa. Trabaje con el mdico para saber qu cambios que puede hacer para mejorar su salud y [place], y [place]. Esta informacin no tiene Marine scientist el consejo del mdico. Asegrese de hacerle al mdico cualquier pregunta que tenga. Document Revised: 11/24/2020 Document Reviewed: 11/24/2020 Elsevier Patient Education  [place], MD [facility]

## 2022-07-12 NOTE — Assessment & Plan Note (Signed)
Pain well-controlled with Tylenol.  Recent fall triggered by dog Scheduled to follow-up with orthopedist next week

## 2022-07-12 NOTE — Patient Instructions (Signed)
Mantenimiento de la salud despus de los 65 aos de edad Health Maintenance After Age 70 Despus de los 65 aos de edad, corre un riesgo mayor de padecer ciertas enfermedades e infecciones a largo plazo, como tambin de sufrir lesiones por cadas. Las cadas son la causa principal de las fracturas de huesos y lesiones en la cabeza de personas mayores de 65 aos de edad. Recibir cuidados preventivos de forma regular puede ayudarlo a mantenerse saludable y en buen estado. Los cuidados preventivos incluyen realizarse anlisis de forma regular y realizar cambios en el estilo de vida segn las recomendaciones del mdico. Converse con el mdico sobre lo siguiente: Las pruebas de deteccin y los anlisis que debe realizarse. Una prueba de deteccin es un estudio que se para detectar la presencia de una enfermedad cuando no tiene sntomas. Un plan de dieta y ejercicios adecuado para usted. Qu debo saber sobre las pruebas de deteccin y los anlisis para prevenir cadas? Realizarse pruebas de deteccin y anlisis es la mejor manera de detectar un problema de salud de forma temprana. El diagnstico y tratamiento tempranos le brindan la mejor oportunidad de controlar las afecciones mdicas que son comunes despus de los 65 aos de edad. Ciertas afecciones y elecciones de estilo de vida pueden hacer que sea ms propenso a sufrir una cada. El mdico puede recomendarle lo siguiente: Controles regulares de la visin. Una visin deficiente y afecciones como las cataratas pueden hacer que sea ms propenso a sufrir una cada. Si usa lentes, asegrese de obtener una receta actualizada si su visin cambia. Revisin de medicamentos. Revise regularmente con el mdico todos los medicamentos que toma, incluidos los medicamentos de venta libre. Consulte al mdico sobre los efectos secundarios que pueden hacer que sea ms propenso a sufrir una cada. Informe al mdico si alguno de los medicamentos que toma lo hace sentir mareado o  somnoliento. Controles de fuerza y equilibrio. El mdico puede recomendar ciertos estudios para controlar su fuerza y equilibrio al estar de pie, al caminar o al cambiar de posicin. Examen de los pies. El dolor y el adormecimiento en los pies, como tambin no utilizar el calzado adecuado, pueden hacer que sea ms propenso a sufrir una cada. Pruebas de deteccin, que incluyen las siguientes: Pruebas de deteccin para la osteoporosis. La osteoporosis es una afeccin que hace que los huesos se tornen ms dbiles y se quiebren con ms facilidad. Pruebas de deteccin para la presin arterial. Los cambios en la presin arterial y los medicamentos para controlar la presin arterial pueden hacerlo sentir mareado. Prueba de deteccin de la depresin. Es ms probable que sufra una cada si tiene miedo a caerse, se siente deprimido o se siente incapaz de realizar actividades que sola hacer. Prueba de deteccin de consumo de alcohol. Beber demasiado alcohol puede afectar su equilibrio y puede hacer que sea ms propenso a sufrir una cada. Siga estas indicaciones en su casa: Estilo de vida No beba alcohol si: Su mdico le indica no hacerlo. Si bebe alcohol: Limite la cantidad que bebe a lo siguiente: De 0 a 1 medida por da para las mujeres. De 0 a 2 medidas por da para los hombres. Sepa cunta cantidad de alcohol hay en las bebidas que toma. En [place], una medida equivale a una botella de cerveza de 12 oz (355 ml), un vaso de vino de 5 oz (148 ml) o un vaso de una bebida alcohlica de alta graduacin de 1 oz (44 ml). No consuma ningn producto que   contenga nicotina o tabaco. Estos productos incluyen cigarrillos, tabaco para mascar y aparatos de vapeo, como los cigarrillos electrnicos. Si necesita ayuda para dejar de consumir estos productos, consulte al mdico. Actividad  Siga un programa de ejercicio regular para mantenerse en forma. Esto lo ayudar a mantener el equilibrio. Consulte al  mdico qu tipos de ejercicios son adecuados para usted. Si necesita un bastn o un andador, selo segn las recomendaciones del mdico. Utilice calzado con buen apoyo y suela antideslizante. Seguridad  Retire los objetos que puedan causar tropiezos tales como alfombras, cables u obstculos. Instale equipos de seguridad, como barras para sostn en los baos y barandas de seguridad en las escaleras. Mantenga las habitaciones y los pasillos bien iluminados. Indicaciones generales Hable con el mdico sobre sus riesgos de sufrir una cada. Infrmele a su mdico si: Se cae. Asegrese de informarle a su mdico acerca de todas las cadas, incluso aquellas que parecen ser menores. Se siente mareado, cansado (tiene fatiga) o siente que pierde el equilibrio. Use los medicamentos de venta libre y los recetados solamente como se lo haya indicado el mdico. Estos incluyen suplementos. Siga una dieta sana y mantenga un peso saludable. Una dieta saludable incluye productos lcteos descremados, carnes bajas en contenido de grasa (magras), fibra de granos enteros, frijoles y muchas frutas y verduras. Mantngase al da con las vacunas. Realcese los estudios de rutina de la salud, dentales y de la vista. Resumen Tener un estilo de vida saludable y recibir cuidados preventivos pueden ayudar a promover la salud y el bienestar despus de los 65 aos de edad. Realizarse pruebas de deteccin y anlisis es la mejor manera de detectar un problema de salud de forma temprana y ayudarlo a evitar una cada. El diagnstico y tratamiento tempranos le brindan la mejor oportunidad de controlar las afecciones mdicas ms comunes en las personas mayores de 65 aos de edad. Las cadas son la causa principal de las fracturas de huesos y lesiones en la cabeza de personas mayores de 65 aos de edad. Tome precauciones para evitar una cada en su casa. Trabaje con el mdico para saber qu cambios que puede hacer para mejorar su salud y  bienestar, y para prevenir las cadas. Esta informacin no tiene como fin reemplazar el consejo del mdico. Asegrese de hacerle al mdico cualquier pregunta que tenga. Document Revised: 11/24/2020 Document Reviewed: 11/24/2020 Elsevier Patient Education  2023 [employer]  

## 2022-07-12 NOTE — Assessment & Plan Note (Signed)
Stable and well-controlled. No signs of GI bleed.  No signs of hepatic encephalopathy.

## 2022-07-12 NOTE — Assessment & Plan Note (Signed)
Stable.  No complications. 

## 2022-07-13 DIAGNOSIS — Z23 Encounter for immunization: Secondary | ICD-10-CM | POA: Diagnosis not present

## 2022-07-13 DIAGNOSIS — Z992 Dependence on renal dialysis: Secondary | ICD-10-CM | POA: Diagnosis not present

## 2022-07-13 DIAGNOSIS — D509 Iron deficiency anemia, unspecified: Secondary | ICD-10-CM | POA: Diagnosis not present

## 2022-07-13 DIAGNOSIS — D631 Anemia in chronic kidney disease: Secondary | ICD-10-CM | POA: Diagnosis not present

## 2022-07-13 DIAGNOSIS — N2581 Secondary hyperparathyroidism of renal origin: Secondary | ICD-10-CM | POA: Diagnosis not present

## 2022-07-13 DIAGNOSIS — D689 Coagulation defect, unspecified: Secondary | ICD-10-CM | POA: Diagnosis not present

## 2022-07-13 DIAGNOSIS — R519 Headache, unspecified: Secondary | ICD-10-CM | POA: Diagnosis not present

## 2022-07-13 DIAGNOSIS — E1122 Type 2 diabetes mellitus with diabetic chronic kidney disease: Secondary | ICD-10-CM | POA: Diagnosis not present

## 2022-07-13 DIAGNOSIS — N186 End stage renal disease: Secondary | ICD-10-CM | POA: Diagnosis not present

## 2022-07-15 DIAGNOSIS — N2581 Secondary hyperparathyroidism of renal origin: Secondary | ICD-10-CM | POA: Diagnosis not present

## 2022-07-15 DIAGNOSIS — N186 End stage renal disease: Secondary | ICD-10-CM | POA: Diagnosis not present

## 2022-07-15 DIAGNOSIS — D689 Coagulation defect, unspecified: Secondary | ICD-10-CM | POA: Diagnosis not present

## 2022-07-15 DIAGNOSIS — D509 Iron deficiency anemia, unspecified: Secondary | ICD-10-CM | POA: Diagnosis not present

## 2022-07-15 DIAGNOSIS — D631 Anemia in chronic kidney disease: Secondary | ICD-10-CM | POA: Diagnosis not present

## 2022-07-15 DIAGNOSIS — R519 Headache, unspecified: Secondary | ICD-10-CM | POA: Diagnosis not present

## 2022-07-15 DIAGNOSIS — E1122 Type 2 diabetes mellitus with diabetic chronic kidney disease: Secondary | ICD-10-CM | POA: Diagnosis not present

## 2022-07-15 DIAGNOSIS — Z23 Encounter for immunization: Secondary | ICD-10-CM | POA: Diagnosis not present

## 2022-07-15 DIAGNOSIS — Z992 Dependence on renal dialysis: Secondary | ICD-10-CM | POA: Diagnosis not present

## 2022-07-18 DIAGNOSIS — N186 End stage renal disease: Secondary | ICD-10-CM | POA: Diagnosis not present

## 2022-07-18 DIAGNOSIS — E1122 Type 2 diabetes mellitus with diabetic chronic kidney disease: Secondary | ICD-10-CM | POA: Diagnosis not present

## 2022-07-18 DIAGNOSIS — Z23 Encounter for immunization: Secondary | ICD-10-CM | POA: Diagnosis not present

## 2022-07-18 DIAGNOSIS — R519 Headache, unspecified: Secondary | ICD-10-CM | POA: Diagnosis not present

## 2022-07-18 DIAGNOSIS — N2581 Secondary hyperparathyroidism of renal origin: Secondary | ICD-10-CM | POA: Diagnosis not present

## 2022-07-18 DIAGNOSIS — Z992 Dependence on renal dialysis: Secondary | ICD-10-CM | POA: Diagnosis not present

## 2022-07-18 DIAGNOSIS — D689 Coagulation defect, unspecified: Secondary | ICD-10-CM | POA: Diagnosis not present

## 2022-07-18 DIAGNOSIS — D509 Iron deficiency anemia, unspecified: Secondary | ICD-10-CM | POA: Diagnosis not present

## 2022-07-18 DIAGNOSIS — D631 Anemia in chronic kidney disease: Secondary | ICD-10-CM | POA: Diagnosis not present

## 2022-07-19 DIAGNOSIS — M533 Sacrococcygeal disorders, not elsewhere classified: Secondary | ICD-10-CM | POA: Diagnosis not present

## 2022-07-20 DIAGNOSIS — Z992 Dependence on renal dialysis: Secondary | ICD-10-CM | POA: Diagnosis not present

## 2022-07-20 DIAGNOSIS — D631 Anemia in chronic kidney disease: Secondary | ICD-10-CM | POA: Diagnosis not present

## 2022-07-20 DIAGNOSIS — D509 Iron deficiency anemia, unspecified: Secondary | ICD-10-CM | POA: Diagnosis not present

## 2022-07-20 DIAGNOSIS — Z23 Encounter for immunization: Secondary | ICD-10-CM | POA: Diagnosis not present

## 2022-07-20 DIAGNOSIS — N2581 Secondary hyperparathyroidism of renal origin: Secondary | ICD-10-CM | POA: Diagnosis not present

## 2022-07-20 DIAGNOSIS — D689 Coagulation defect, unspecified: Secondary | ICD-10-CM | POA: Diagnosis not present

## 2022-07-20 DIAGNOSIS — R519 Headache, unspecified: Secondary | ICD-10-CM | POA: Diagnosis not present

## 2022-07-20 DIAGNOSIS — N186 End stage renal disease: Secondary | ICD-10-CM | POA: Diagnosis not present

## 2022-07-20 DIAGNOSIS — E1122 Type 2 diabetes mellitus with diabetic chronic kidney disease: Secondary | ICD-10-CM | POA: Diagnosis not present

## 2022-07-21 DIAGNOSIS — H25041 Posterior subcapsular polar age-related cataract, right eye: Secondary | ICD-10-CM | POA: Diagnosis not present

## 2022-07-21 DIAGNOSIS — E119 Type 2 diabetes mellitus without complications: Secondary | ICD-10-CM | POA: Diagnosis not present

## 2022-07-21 DIAGNOSIS — H5201 Hypermetropia, right eye: Secondary | ICD-10-CM | POA: Diagnosis not present

## 2022-07-21 DIAGNOSIS — H524 Presbyopia: Secondary | ICD-10-CM | POA: Diagnosis not present

## 2022-07-21 DIAGNOSIS — H52201 Unspecified astigmatism, right eye: Secondary | ICD-10-CM | POA: Diagnosis not present

## 2022-07-22 DIAGNOSIS — Z23 Encounter for immunization: Secondary | ICD-10-CM | POA: Diagnosis not present

## 2022-07-22 DIAGNOSIS — N186 End stage renal disease: Secondary | ICD-10-CM | POA: Diagnosis not present

## 2022-07-22 DIAGNOSIS — Z992 Dependence on renal dialysis: Secondary | ICD-10-CM | POA: Diagnosis not present

## 2022-07-22 DIAGNOSIS — D631 Anemia in chronic kidney disease: Secondary | ICD-10-CM | POA: Diagnosis not present

## 2022-07-22 DIAGNOSIS — D689 Coagulation defect, unspecified: Secondary | ICD-10-CM | POA: Diagnosis not present

## 2022-07-22 DIAGNOSIS — D509 Iron deficiency anemia, unspecified: Secondary | ICD-10-CM | POA: Diagnosis not present

## 2022-07-22 DIAGNOSIS — N2581 Secondary hyperparathyroidism of renal origin: Secondary | ICD-10-CM | POA: Diagnosis not present

## 2022-07-22 DIAGNOSIS — R519 Headache, unspecified: Secondary | ICD-10-CM | POA: Diagnosis not present

## 2022-07-22 DIAGNOSIS — E1122 Type 2 diabetes mellitus with diabetic chronic kidney disease: Secondary | ICD-10-CM | POA: Diagnosis not present

## 2022-07-25 ENCOUNTER — Telehealth: Payer: Self-pay | Admitting: Emergency Medicine

## 2022-07-25 DIAGNOSIS — Z23 Encounter for immunization: Secondary | ICD-10-CM | POA: Diagnosis not present

## 2022-07-25 DIAGNOSIS — D509 Iron deficiency anemia, unspecified: Secondary | ICD-10-CM | POA: Diagnosis not present

## 2022-07-25 DIAGNOSIS — D689 Coagulation defect, unspecified: Secondary | ICD-10-CM | POA: Diagnosis not present

## 2022-07-25 DIAGNOSIS — N186 End stage renal disease: Secondary | ICD-10-CM | POA: Diagnosis not present

## 2022-07-25 DIAGNOSIS — E1122 Type 2 diabetes mellitus with diabetic chronic kidney disease: Secondary | ICD-10-CM | POA: Diagnosis not present

## 2022-07-25 DIAGNOSIS — Z992 Dependence on renal dialysis: Secondary | ICD-10-CM | POA: Diagnosis not present

## 2022-07-25 DIAGNOSIS — R519 Headache, unspecified: Secondary | ICD-10-CM | POA: Diagnosis not present

## 2022-07-25 DIAGNOSIS — N2581 Secondary hyperparathyroidism of renal origin: Secondary | ICD-10-CM | POA: Diagnosis not present

## 2022-07-25 DIAGNOSIS — D631 Anemia in chronic kidney disease: Secondary | ICD-10-CM | POA: Diagnosis not present

## 2022-07-25 NOTE — Telephone Encounter (Signed)
error 

## 2022-07-26 ENCOUNTER — Telehealth: Payer: Self-pay

## 2022-07-26 DIAGNOSIS — Z992 Dependence on renal dialysis: Secondary | ICD-10-CM | POA: Diagnosis not present

## 2022-07-26 DIAGNOSIS — N186 End stage renal disease: Secondary | ICD-10-CM | POA: Diagnosis not present

## 2022-07-26 DIAGNOSIS — Z452 Encounter for adjustment and management of vascular access device: Secondary | ICD-10-CM | POA: Diagnosis not present

## 2022-07-26 NOTE — Progress Notes (Signed)
  Chronic Care Management   Note  07/26/2022 Name: Stacey Chambers MRN: [id number] DOB: 03-13-1951  [name] is a 72 y.o. year old female who is a primary care patient of Mitchel Honour, Ines Bloomer, MD. I reached out to [name] by phone today in response to a referral sent by [name]'s PCP.  [name] was given information about Chronic Care Management services today including:  CCM service includes personalized support from designated clinical staff supervised by the physician, including individualized plan of care and coordination with other care providers 24/7 contact phone numbers for assistance for urgent and routine care needs. Service will only be billed when office clinical staff spend 20 minutes or more in a month to coordinate care. Only one practitioner may furnish and bill the service in a calendar month. The patient may stop CCM services at amy time (effective at the end of the month) by phone call to the office staff. The patient will be responsible for cost sharing (co-pay) or up to 20% of the service fee (after annual deductible is met)  [name]  declinedto scheduling an appointment with the CCM RN Case Manager   Follow up plan: Patient did not agree to scheduling an appointment with the RN Case Manager. The ordering provider has been notified. Patient is changing PCP.  Norton Blizzard, CMA

## 2022-07-27 DIAGNOSIS — N2581 Secondary hyperparathyroidism of renal origin: Secondary | ICD-10-CM | POA: Diagnosis not present

## 2022-07-27 DIAGNOSIS — D689 Coagulation defect, unspecified: Secondary | ICD-10-CM | POA: Diagnosis not present

## 2022-07-27 DIAGNOSIS — R519 Headache, unspecified: Secondary | ICD-10-CM | POA: Diagnosis not present

## 2022-07-27 DIAGNOSIS — D631 Anemia in chronic kidney disease: Secondary | ICD-10-CM | POA: Diagnosis not present

## 2022-07-27 DIAGNOSIS — Z23 Encounter for immunization: Secondary | ICD-10-CM | POA: Diagnosis not present

## 2022-07-27 DIAGNOSIS — Z992 Dependence on renal dialysis: Secondary | ICD-10-CM | POA: Diagnosis not present

## 2022-07-27 DIAGNOSIS — D509 Iron deficiency anemia, unspecified: Secondary | ICD-10-CM | POA: Diagnosis not present

## 2022-07-27 DIAGNOSIS — N186 End stage renal disease: Secondary | ICD-10-CM | POA: Diagnosis not present

## 2022-07-27 DIAGNOSIS — E1122 Type 2 diabetes mellitus with diabetic chronic kidney disease: Secondary | ICD-10-CM | POA: Diagnosis not present

## 2022-07-29 ENCOUNTER — Encounter: Payer: Self-pay | Admitting: Vascular Surgery

## 2022-07-29 DIAGNOSIS — D509 Iron deficiency anemia, unspecified: Secondary | ICD-10-CM | POA: Diagnosis not present

## 2022-07-29 DIAGNOSIS — D689 Coagulation defect, unspecified: Secondary | ICD-10-CM | POA: Diagnosis not present

## 2022-07-29 DIAGNOSIS — R519 Headache, unspecified: Secondary | ICD-10-CM | POA: Diagnosis not present

## 2022-07-29 DIAGNOSIS — N186 End stage renal disease: Secondary | ICD-10-CM | POA: Diagnosis not present

## 2022-07-29 DIAGNOSIS — Z23 Encounter for immunization: Secondary | ICD-10-CM | POA: Diagnosis not present

## 2022-07-29 DIAGNOSIS — E1122 Type 2 diabetes mellitus with diabetic chronic kidney disease: Secondary | ICD-10-CM | POA: Diagnosis not present

## 2022-07-29 DIAGNOSIS — D631 Anemia in chronic kidney disease: Secondary | ICD-10-CM | POA: Diagnosis not present

## 2022-07-29 DIAGNOSIS — Z992 Dependence on renal dialysis: Secondary | ICD-10-CM | POA: Diagnosis not present

## 2022-07-29 DIAGNOSIS — N2581 Secondary hyperparathyroidism of renal origin: Secondary | ICD-10-CM | POA: Diagnosis not present

## 2022-07-31 DIAGNOSIS — D689 Coagulation defect, unspecified: Secondary | ICD-10-CM | POA: Diagnosis not present

## 2022-07-31 DIAGNOSIS — N186 End stage renal disease: Secondary | ICD-10-CM | POA: Diagnosis not present

## 2022-07-31 DIAGNOSIS — R519 Headache, unspecified: Secondary | ICD-10-CM | POA: Diagnosis not present

## 2022-07-31 DIAGNOSIS — Z23 Encounter for immunization: Secondary | ICD-10-CM | POA: Diagnosis not present

## 2022-07-31 DIAGNOSIS — E1122 Type 2 diabetes mellitus with diabetic chronic kidney disease: Secondary | ICD-10-CM | POA: Diagnosis not present

## 2022-07-31 DIAGNOSIS — N2581 Secondary hyperparathyroidism of renal origin: Secondary | ICD-10-CM | POA: Diagnosis not present

## 2022-07-31 DIAGNOSIS — Z992 Dependence on renal dialysis: Secondary | ICD-10-CM | POA: Diagnosis not present

## 2022-07-31 DIAGNOSIS — D631 Anemia in chronic kidney disease: Secondary | ICD-10-CM | POA: Diagnosis not present

## 2022-07-31 DIAGNOSIS — D509 Iron deficiency anemia, unspecified: Secondary | ICD-10-CM | POA: Diagnosis not present

## 2022-08-02 DIAGNOSIS — N186 End stage renal disease: Secondary | ICD-10-CM | POA: Diagnosis not present

## 2022-08-02 DIAGNOSIS — D631 Anemia in chronic kidney disease: Secondary | ICD-10-CM | POA: Diagnosis not present

## 2022-08-02 DIAGNOSIS — Z23 Encounter for immunization: Secondary | ICD-10-CM | POA: Diagnosis not present

## 2022-08-02 DIAGNOSIS — R519 Headache, unspecified: Secondary | ICD-10-CM | POA: Diagnosis not present

## 2022-08-02 DIAGNOSIS — E1122 Type 2 diabetes mellitus with diabetic chronic kidney disease: Secondary | ICD-10-CM | POA: Diagnosis not present

## 2022-08-02 DIAGNOSIS — D509 Iron deficiency anemia, unspecified: Secondary | ICD-10-CM | POA: Diagnosis not present

## 2022-08-02 DIAGNOSIS — N2581 Secondary hyperparathyroidism of renal origin: Secondary | ICD-10-CM | POA: Diagnosis not present

## 2022-08-02 DIAGNOSIS — D689 Coagulation defect, unspecified: Secondary | ICD-10-CM | POA: Diagnosis not present

## 2022-08-02 DIAGNOSIS — Z992 Dependence on renal dialysis: Secondary | ICD-10-CM | POA: Diagnosis not present

## 2022-08-29 ENCOUNTER — Ambulatory Visit: Payer: Medicaid Other

## 2022-08-29 ENCOUNTER — Ambulatory Visit (HOSPITAL_COMMUNITY): Payer: 59

## 2022-09-15 ENCOUNTER — Encounter (HOSPITAL_COMMUNITY): Payer: Self-pay

## 2022-09-15 ENCOUNTER — Inpatient Hospital Stay (HOSPITAL_COMMUNITY)
Admission: EM | Admit: 2022-09-15 | Discharge: 2022-09-21 | DRG: 640 | Disposition: A | Discharge status: Home / Self Care | Payer: 59 | Attending: Internal Medicine | Admitting: Internal Medicine

## 2022-09-15 ENCOUNTER — Emergency Department (HOSPITAL_COMMUNITY): Payer: 59

## 2022-09-15 DIAGNOSIS — Z833 Family history of diabetes mellitus: Secondary | ICD-10-CM

## 2022-09-15 DIAGNOSIS — Z79890 Hormone replacement therapy (postmenopausal): Secondary | ICD-10-CM

## 2022-09-15 DIAGNOSIS — Z992 Dependence on renal dialysis: Secondary | ICD-10-CM

## 2022-09-15 DIAGNOSIS — Z79899 Other long term (current) drug therapy: Secondary | ICD-10-CM

## 2022-09-15 DIAGNOSIS — E8770 Fluid overload, unspecified: Secondary | ICD-10-CM | POA: Diagnosis present

## 2022-09-15 DIAGNOSIS — J9601 Acute respiratory failure with hypoxia: Secondary | ICD-10-CM | POA: Diagnosis present

## 2022-09-15 DIAGNOSIS — Z8711 Personal history of peptic ulcer disease: Secondary | ICD-10-CM

## 2022-09-15 DIAGNOSIS — E1122 Type 2 diabetes mellitus with diabetic chronic kidney disease: Secondary | ICD-10-CM | POA: Diagnosis present

## 2022-09-15 DIAGNOSIS — I3139 Other pericardial effusion (noninflammatory): Secondary | ICD-10-CM | POA: Diagnosis present

## 2022-09-15 DIAGNOSIS — I5031 Acute diastolic (congestive) heart failure: Secondary | ICD-10-CM | POA: Diagnosis present

## 2022-09-15 DIAGNOSIS — J9811 Atelectasis: Secondary | ICD-10-CM | POA: Diagnosis present

## 2022-09-15 DIAGNOSIS — E039 Hypothyroidism, unspecified: Secondary | ICD-10-CM | POA: Diagnosis present

## 2022-09-15 DIAGNOSIS — N2889 Other specified disorders of kidney and ureter: Secondary | ICD-10-CM | POA: Diagnosis present

## 2022-09-15 DIAGNOSIS — D696 Thrombocytopenia, unspecified: Secondary | ICD-10-CM | POA: Diagnosis present

## 2022-09-15 DIAGNOSIS — J810 Acute pulmonary edema: Principal | ICD-10-CM

## 2022-09-15 DIAGNOSIS — J90 Pleural effusion, not elsewhere classified: Secondary | ICD-10-CM

## 2022-09-15 DIAGNOSIS — E871 Hypo-osmolality and hyponatremia: Secondary | ICD-10-CM | POA: Diagnosis not present

## 2022-09-15 DIAGNOSIS — E119 Type 2 diabetes mellitus without complications: Secondary | ICD-10-CM

## 2022-09-15 DIAGNOSIS — N2581 Secondary hyperparathyroidism of renal origin: Secondary | ICD-10-CM | POA: Diagnosis present

## 2022-09-15 DIAGNOSIS — R7989 Other specified abnormal findings of blood chemistry: Secondary | ICD-10-CM

## 2022-09-15 DIAGNOSIS — N186 End stage renal disease: Secondary | ICD-10-CM | POA: Diagnosis present

## 2022-09-15 DIAGNOSIS — U071 COVID-19: Secondary | ICD-10-CM | POA: Diagnosis not present

## 2022-09-15 DIAGNOSIS — A419 Sepsis, unspecified organism: Secondary | ICD-10-CM

## 2022-09-15 DIAGNOSIS — R188 Other ascites: Secondary | ICD-10-CM | POA: Diagnosis present

## 2022-09-15 DIAGNOSIS — I5033 Acute on chronic diastolic (congestive) heart failure: Secondary | ICD-10-CM | POA: Diagnosis present

## 2022-09-15 DIAGNOSIS — K7460 Unspecified cirrhosis of liver: Secondary | ICD-10-CM | POA: Diagnosis present

## 2022-09-15 DIAGNOSIS — J1282 Pneumonia due to coronavirus disease 2019: Secondary | ICD-10-CM | POA: Diagnosis present

## 2022-09-15 DIAGNOSIS — D509 Iron deficiency anemia, unspecified: Secondary | ICD-10-CM | POA: Diagnosis present

## 2022-09-15 DIAGNOSIS — I132 Hypertensive heart and chronic kidney disease with heart failure and with stage 5 chronic kidney disease, or end stage renal disease: Secondary | ICD-10-CM | POA: Diagnosis present

## 2022-09-15 DIAGNOSIS — D631 Anemia in chronic kidney disease: Secondary | ICD-10-CM | POA: Diagnosis present

## 2022-09-15 DIAGNOSIS — Z862 Personal history of diseases of the blood and blood-forming organs and certain disorders involving the immune mechanism: Secondary | ICD-10-CM

## 2022-09-15 DIAGNOSIS — Z789 Other specified health status: Secondary | ICD-10-CM

## 2022-09-15 DIAGNOSIS — H547 Unspecified visual loss: Secondary | ICD-10-CM | POA: Diagnosis present

## 2022-09-15 MED ORDER — METRONIDAZOLE 500 MG/100ML IV SOLN
500.0000 mg | Freq: Once | INTRAVENOUS | Status: AC
  Administered 2022-09-16: 500 mg via INTRAVENOUS
  Filled 2022-09-15: qty 100

## 2022-09-15 MED ORDER — VANCOMYCIN HCL 1250 MG/250ML IV SOLN
1250.0000 mg | Freq: Once | INTRAVENOUS | Status: AC
  Administered 2022-09-16: 1250 mg via INTRAVENOUS
  Filled 2022-09-15: qty 250

## 2022-09-15 MED ORDER — SODIUM CHLORIDE 0.9 % IV SOLN
2.0000 g | Freq: Once | INTRAVENOUS | Status: AC
  Administered 2022-09-16: 2 g via INTRAVENOUS
  Filled 2022-09-15: qty 12.5

## 2022-09-15 MED ORDER — VANCOMYCIN HCL IN DEXTROSE 1-5 GM/200ML-% IV SOLN: 1000.0000 mg | Freq: Once | INTRAVENOUS | Status: DC

## 2022-09-15 NOTE — ED Triage Notes (Signed)
Intial complaint was Diff breathing, Medic she felt hot, Pt is a dialysis pt. Pt has cough, unproductive, tachy hr, with fever.   Medic vitals: Fever 101.0 Temporal HR 114 highest RR 24 Spo2 99% 172/74 CBG 118  IV access 20g rt wrist 650 tylenol given PO

## 2022-09-16 ENCOUNTER — Other Ambulatory Visit: Payer: Self-pay

## 2022-09-16 ENCOUNTER — Inpatient Hospital Stay (HOSPITAL_COMMUNITY): Payer: 59

## 2022-09-16 DIAGNOSIS — E1122 Type 2 diabetes mellitus with diabetic chronic kidney disease: Secondary | ICD-10-CM | POA: Diagnosis present

## 2022-09-16 DIAGNOSIS — N2581 Secondary hyperparathyroidism of renal origin: Secondary | ICD-10-CM | POA: Diagnosis present

## 2022-09-16 DIAGNOSIS — I5021 Acute systolic (congestive) heart failure: Secondary | ICD-10-CM | POA: Diagnosis not present

## 2022-09-16 DIAGNOSIS — R188 Other ascites: Secondary | ICD-10-CM | POA: Diagnosis present

## 2022-09-16 DIAGNOSIS — D631 Anemia in chronic kidney disease: Secondary | ICD-10-CM | POA: Diagnosis present

## 2022-09-16 DIAGNOSIS — N2889 Other specified disorders of kidney and ureter: Secondary | ICD-10-CM | POA: Diagnosis present

## 2022-09-16 DIAGNOSIS — A419 Sepsis, unspecified organism: Secondary | ICD-10-CM

## 2022-09-16 DIAGNOSIS — J90 Pleural effusion, not elsewhere classified: Secondary | ICD-10-CM | POA: Diagnosis not present

## 2022-09-16 DIAGNOSIS — K7460 Unspecified cirrhosis of liver: Secondary | ICD-10-CM | POA: Diagnosis present

## 2022-09-16 DIAGNOSIS — J9601 Acute respiratory failure with hypoxia: Secondary | ICD-10-CM | POA: Diagnosis present

## 2022-09-16 DIAGNOSIS — Z992 Dependence on renal dialysis: Secondary | ICD-10-CM | POA: Diagnosis not present

## 2022-09-16 DIAGNOSIS — D696 Thrombocytopenia, unspecified: Secondary | ICD-10-CM | POA: Diagnosis present

## 2022-09-16 DIAGNOSIS — Z79890 Hormone replacement therapy (postmenopausal): Secondary | ICD-10-CM | POA: Diagnosis not present

## 2022-09-16 DIAGNOSIS — D509 Iron deficiency anemia, unspecified: Secondary | ICD-10-CM | POA: Diagnosis present

## 2022-09-16 DIAGNOSIS — R7989 Other specified abnormal findings of blood chemistry: Secondary | ICD-10-CM | POA: Diagnosis not present

## 2022-09-16 DIAGNOSIS — J1282 Pneumonia due to coronavirus disease 2019: Secondary | ICD-10-CM | POA: Diagnosis present

## 2022-09-16 DIAGNOSIS — U071 COVID-19: Secondary | ICD-10-CM | POA: Diagnosis present

## 2022-09-16 DIAGNOSIS — H547 Unspecified visual loss: Secondary | ICD-10-CM | POA: Diagnosis present

## 2022-09-16 DIAGNOSIS — J9811 Atelectasis: Secondary | ICD-10-CM | POA: Diagnosis present

## 2022-09-16 DIAGNOSIS — N186 End stage renal disease: Secondary | ICD-10-CM | POA: Diagnosis present

## 2022-09-16 DIAGNOSIS — I132 Hypertensive heart and chronic kidney disease with heart failure and with stage 5 chronic kidney disease, or end stage renal disease: Secondary | ICD-10-CM | POA: Diagnosis present

## 2022-09-16 DIAGNOSIS — E039 Hypothyroidism, unspecified: Secondary | ICD-10-CM | POA: Diagnosis present

## 2022-09-16 DIAGNOSIS — E871 Hypo-osmolality and hyponatremia: Secondary | ICD-10-CM | POA: Diagnosis not present

## 2022-09-16 DIAGNOSIS — E8770 Fluid overload, unspecified: Secondary | ICD-10-CM | POA: Diagnosis present

## 2022-09-16 DIAGNOSIS — I5033 Acute on chronic diastolic (congestive) heart failure: Secondary | ICD-10-CM | POA: Diagnosis present

## 2022-09-16 DIAGNOSIS — I3139 Other pericardial effusion (noninflammatory): Secondary | ICD-10-CM | POA: Diagnosis present

## 2022-09-16 DIAGNOSIS — J810 Acute pulmonary edema: Secondary | ICD-10-CM | POA: Diagnosis not present

## 2022-09-16 DIAGNOSIS — E8779 Other fluid overload: Secondary | ICD-10-CM | POA: Diagnosis not present

## 2022-09-16 DIAGNOSIS — Z8711 Personal history of peptic ulcer disease: Secondary | ICD-10-CM | POA: Diagnosis not present

## 2022-09-16 LAB — COMPREHENSIVE METABOLIC PANEL
ALT: 14 U/L (ref 0–44)
ALT: 16 U/L (ref 0–44)
AST: 27 U/L (ref 15–41)
AST: 28 U/L (ref 15–41)
Albumin: 3.1 g/dL — ABNORMAL LOW (ref 3.5–5.0)
Albumin: 3.2 g/dL — ABNORMAL LOW (ref 3.5–5.0)
Alkaline Phosphatase: 100 U/L (ref 38–126)
Alkaline Phosphatase: 86 U/L (ref 38–126)
Anion gap: 12 (ref 5–15)
Anion gap: 13 (ref 5–15)
BUN: 13 mg/dL (ref 8–23)
BUN: 16 mg/dL (ref 8–23)
CO2: 24 mmol/L (ref 22–32)
CO2: 25 mmol/L (ref 22–32)
Calcium: 8.1 mg/dL — ABNORMAL LOW (ref 8.9–10.3)
Calcium: 8.2 mg/dL — ABNORMAL LOW (ref 8.9–10.3)
Chloride: 92 mmol/L — ABNORMAL LOW (ref 98–111)
Chloride: 92 mmol/L — ABNORMAL LOW (ref 98–111)
Creatinine, Ser: 3.21 mg/dL — ABNORMAL HIGH (ref 0.44–1.00)
Creatinine, Ser: 3.5 mg/dL — ABNORMAL HIGH (ref 0.44–1.00)
GFR, Estimated: 14 mL/min — ABNORMAL LOW (ref >60)
GFR, Estimated: 15 mL/min — ABNORMAL LOW (ref >60)
Glucose, Bld: 130 mg/dL — ABNORMAL HIGH (ref 70–99)
Glucose, Bld: 97 mg/dL (ref 70–99)
Potassium: 4 mmol/L (ref 3.5–5.1)
Potassium: 4.2 mmol/L (ref 3.5–5.1)
Sodium: 129 mmol/L — ABNORMAL LOW (ref 135–145)
Sodium: 129 mmol/L — ABNORMAL LOW (ref 135–145)
Total Bilirubin: 0.6 mg/dL (ref 0.3–1.2)
Total Bilirubin: 1 mg/dL (ref 0.3–1.2)
Total Protein: 7.1 g/dL (ref 6.5–8.1)
Total Protein: 7.3 g/dL (ref 6.5–8.1)

## 2022-09-16 LAB — LACTIC ACID, PLASMA: Lactic Acid, Venous: 1.7 mmol/L (ref 0.5–1.9)

## 2022-09-16 LAB — URINALYSIS, W/ REFLEX TO CULTURE (INFECTION SUSPECTED)
Bacteria, UA: NONE SEEN
Bilirubin Urine: NEGATIVE
Glucose, UA: 50 mg/dL — AB
Ketones, ur: NEGATIVE mg/dL
Leukocytes,Ua: NEGATIVE
Nitrite: NEGATIVE
Protein, ur: 100 mg/dL — AB
Specific Gravity, Urine: 1.006 (ref 1.005–1.030)
pH: 9 — ABNORMAL HIGH (ref 5.0–8.0)

## 2022-09-16 LAB — CBC WITH DIFFERENTIAL/PLATELET
Abs Immature Granulocytes: 0.01 10*3/uL (ref 0.00–0.07)
Basophils Absolute: 0 10*3/uL (ref 0.0–0.1)
Basophils Relative: 1 %
Eosinophils Absolute: 0 10*3/uL (ref 0.0–0.5)
Eosinophils Relative: 2 %
HCT: 26.9 % — ABNORMAL LOW (ref 36.0–46.0)
Hemoglobin: 9.2 g/dL — ABNORMAL LOW (ref 12.0–15.0)
Immature Granulocytes: 0 %
Lymphocytes Relative: 14 %
Lymphs Abs: 0.4 10*3/uL — ABNORMAL LOW (ref 0.7–4.0)
MCH: 35.4 pg — ABNORMAL HIGH (ref 26.0–34.0)
MCHC: 34.2 g/dL (ref 30.0–36.0)
MCV: 103.5 fL — ABNORMAL HIGH (ref 80.0–100.0)
Monocytes Absolute: 0.6 10*3/uL (ref 0.1–1.0)
Monocytes Relative: 24 %
Neutro Abs: 1.5 10*3/uL — ABNORMAL LOW (ref 1.7–7.7)
Neutrophils Relative %: 59 %
Platelets: 41 10*3/uL — ABNORMAL LOW (ref 150–400)
RBC: 2.6 MIL/uL — ABNORMAL LOW (ref 3.87–5.11)
RDW: 14.2 % (ref 11.5–15.5)
WBC: 2.6 10*3/uL — ABNORMAL LOW (ref 4.0–10.5)
nRBC: 0 % (ref 0.0–0.2)

## 2022-09-16 LAB — VITAMIN B12: Vitamin B-12: 692 pg/mL (ref 180–914)

## 2022-09-16 LAB — ECHOCARDIOGRAM COMPLETE
AR max vel: 1.89 cm2
AV Area VTI: 2.07 cm2
AV Area mean vel: 1.98 cm2
AV Mean grad: 10 mmHg
AV Peak grad: 18.6 mmHg
Ao pk vel: 2.16 m/s
Area-P 1/2: 4.8 cm2
Height: 59 in
S' Lateral: 2.2 cm
Weight: 2349.22 oz

## 2022-09-16 LAB — FERRITIN: Ferritin: 378 ng/mL — ABNORMAL HIGH (ref 11–307)

## 2022-09-16 LAB — IRON AND TIBC
Iron: 47 ug/dL (ref 28–170)
Saturation Ratios: 20 % (ref 10.4–31.8)
TIBC: 239 ug/dL — ABNORMAL LOW (ref 250–450)
UIBC: 192 ug/dL

## 2022-09-16 LAB — TROPONIN I (HIGH SENSITIVITY)
Troponin I (High Sensitivity): 57 ng/L — ABNORMAL HIGH (ref <18)
Troponin I (High Sensitivity): 63 ng/L — ABNORMAL HIGH (ref <18)

## 2022-09-16 LAB — CBC
HCT: 27.1 % — ABNORMAL LOW (ref 36.0–46.0)
Hemoglobin: 9.4 g/dL — ABNORMAL LOW (ref 12.0–15.0)
MCH: 35.9 pg — ABNORMAL HIGH (ref 26.0–34.0)
MCHC: 34.7 g/dL (ref 30.0–36.0)
MCV: 103.4 fL — ABNORMAL HIGH (ref 80.0–100.0)
Platelets: 40 10*3/uL — ABNORMAL LOW (ref 150–400)
RBC: 2.62 MIL/uL — ABNORMAL LOW (ref 3.87–5.11)
RDW: 14 % (ref 11.5–15.5)
WBC: 2.4 10*3/uL — ABNORMAL LOW (ref 4.0–10.5)
nRBC: 0 % (ref 0.0–0.2)

## 2022-09-16 LAB — RESP PANEL BY RT-PCR (RSV, FLU A&B, COVID)  RVPGX2
Influenza A by PCR: NEGATIVE
Influenza B by PCR: NEGATIVE
Resp Syncytial Virus by PCR: NEGATIVE
SARS Coronavirus 2 by RT PCR: POSITIVE — AB

## 2022-09-16 LAB — FOLATE: Folate: 19.7 ng/mL (ref >5.9)

## 2022-09-16 LAB — PROTIME-INR
INR: 1.4 — ABNORMAL HIGH (ref 0.8–1.2)
Prothrombin Time: 17.4 seconds — ABNORMAL HIGH (ref 11.4–15.2)

## 2022-09-16 LAB — RETICULOCYTES
Immature Retic Fract: 13.5 % (ref 2.3–15.9)
RBC.: 2.64 MIL/uL — ABNORMAL LOW (ref 3.87–5.11)
Retic Count, Absolute: 41.7 10*3/uL (ref 19.0–186.0)
Retic Ct Pct: 1.6 % (ref 0.4–3.1)

## 2022-09-16 LAB — BRAIN NATRIURETIC PEPTIDE: B Natriuretic Peptide: 1140 pg/mL — ABNORMAL HIGH (ref 0.0–100.0)

## 2022-09-16 LAB — APTT: aPTT: 36 seconds (ref 24–36)

## 2022-09-16 MED ORDER — METOPROLOL SUCCINATE ER 50 MG PO TB24
50.0000 mg | ORAL_TABLET | Freq: Every day | ORAL | Status: DC
  Administered 2022-09-16 – 2022-09-21 (×5): 50 mg via ORAL
  Filled 2022-09-16 (×5): qty 1
  Filled 2022-09-16: qty 2

## 2022-09-16 MED ORDER — METHYLPREDNISOLONE SODIUM SUCC 125 MG IJ SOLR
1.0000 mg/kg | INTRAMUSCULAR | Status: AC
  Administered 2022-09-16 – 2022-09-18 (×3): 66.875 mg via INTRAVENOUS
  Filled 2022-09-16 (×3): qty 2

## 2022-09-16 MED ORDER — LEVOTHYROXINE SODIUM 25 MCG PO TABS
25.0000 ug | ORAL_TABLET | Freq: Every day | ORAL | Status: DC
  Administered 2022-09-16 – 2022-09-21 (×6): 25 ug via ORAL
  Filled 2022-09-16 (×6): qty 1

## 2022-09-16 MED ORDER — ACETAMINOPHEN 500 MG PO TABS: 1000.0000 mg | ORAL_TABLET | Freq: Once | ORAL | Status: DC

## 2022-09-16 MED ORDER — ONDANSETRON HCL 4 MG/2ML IJ SOLN: 4.0000 mg | Freq: Four times a day (QID) | INTRAMUSCULAR | Status: DC | PRN

## 2022-09-16 MED ORDER — SODIUM CHLORIDE 0.9 % IV SOLN
200.0000 mg | Freq: Once | INTRAVENOUS | Status: AC
  Administered 2022-09-16: 200 mg via INTRAVENOUS
  Filled 2022-09-16: qty 40

## 2022-09-16 MED ORDER — LOSARTAN POTASSIUM 50 MG PO TABS
50.0000 mg | ORAL_TABLET | Freq: Every day | ORAL | Status: DC
  Administered 2022-09-16 – 2022-09-21 (×5): 50 mg via ORAL
  Filled 2022-09-16 (×6): qty 1

## 2022-09-16 MED ORDER — ACETAMINOPHEN 650 MG RE SUPP: 650.0000 mg | Freq: Four times a day (QID) | RECTAL | Status: DC | PRN

## 2022-09-16 MED ORDER — MELATONIN 3 MG PO TABS
3.0000 mg | ORAL_TABLET | Freq: Every day | ORAL | Status: DC
  Administered 2022-09-16 – 2022-09-20 (×5): 3 mg via ORAL
  Filled 2022-09-16 (×5): qty 1

## 2022-09-16 MED ORDER — SODIUM CHLORIDE 0.9 % IV SOLN
100.0000 mg | Freq: Every day | INTRAVENOUS | Status: AC
  Administered 2022-09-17 – 2022-09-18 (×2): 100 mg via INTRAVENOUS
  Filled 2022-09-16 (×2): qty 20

## 2022-09-16 MED ORDER — ALBUTEROL SULFATE (2.5 MG/3ML) 0.083% IN NEBU: 2.5000 mg | INHALATION_SOLUTION | RESPIRATORY_TRACT | Status: DC | PRN

## 2022-09-16 MED ORDER — ONDANSETRON HCL 4 MG PO TABS: 4.0000 mg | ORAL_TABLET | Freq: Four times a day (QID) | ORAL | Status: DC | PRN

## 2022-09-16 MED ORDER — FUROSEMIDE 10 MG/ML IJ SOLN
80.0000 mg | Freq: Two times a day (BID) | INTRAMUSCULAR | Status: DC
  Administered 2022-09-16 – 2022-09-17 (×3): 80 mg via INTRAVENOUS
  Filled 2022-09-16 (×3): qty 8

## 2022-09-16 MED ORDER — FERROUS GLUCONATE 324 (38 FE) MG PO TABS: 324.0000 mg | ORAL_TABLET | Freq: Every day | ORAL | Status: DC

## 2022-09-16 MED ORDER — DOXERCALCIFEROL 4 MCG/2ML IV SOLN
3.0000 ug | INTRAVENOUS | Status: DC
  Administered 2022-09-18 – 2022-09-20 (×2): 3 ug via INTRAVENOUS
  Filled 2022-09-16 (×4): qty 2

## 2022-09-16 MED ORDER — PANTOPRAZOLE SODIUM 40 MG PO TBEC
40.0000 mg | DELAYED_RELEASE_TABLET | Freq: Every day | ORAL | Status: DC
  Administered 2022-09-16 – 2022-09-21 (×6): 40 mg via ORAL
  Filled 2022-09-16 (×6): qty 1

## 2022-09-16 MED ORDER — ACETAMINOPHEN 325 MG PO TABS
650.0000 mg | ORAL_TABLET | Freq: Four times a day (QID) | ORAL | Status: DC | PRN
  Administered 2022-09-16 – 2022-09-19 (×4): 650 mg via ORAL
  Filled 2022-09-16 (×4): qty 2

## 2022-09-16 MED ORDER — CHLORHEXIDINE GLUCONATE CLOTH 2 % EX PADS
6.0000 | MEDICATED_PAD | Freq: Every day | CUTANEOUS | Status: DC
  Administered 2022-09-16 – 2022-09-21 (×5): 6 via TOPICAL

## 2022-09-16 MED ORDER — HEPARIN SODIUM (PORCINE) 5000 UNIT/ML IJ SOLN
5000.0000 [IU] | Freq: Three times a day (TID) | INTRAMUSCULAR | Status: DC
  Administered 2022-09-16 – 2022-09-21 (×11): 5000 [IU] via SUBCUTANEOUS
  Filled 2022-09-16 (×13): qty 1

## 2022-09-16 MED ORDER — PREDNISONE 5 MG PO TABS
50.0000 mg | ORAL_TABLET | Freq: Every day | ORAL | Status: DC
  Administered 2022-09-19 – 2022-09-21 (×3): 50 mg via ORAL
  Filled 2022-09-16 (×4): qty 2

## 2022-09-16 MED ORDER — RENA-VITE PO TABS
1.0000 | ORAL_TABLET | Freq: Every day | ORAL | Status: DC
  Administered 2022-09-16 – 2022-09-20 (×5): 1 via ORAL
  Filled 2022-09-16 (×6): qty 1

## 2022-09-16 MED ORDER — FUROSEMIDE 10 MG/ML IJ SOLN
40.0000 mg | Freq: Once | INTRAMUSCULAR | Status: AC
  Administered 2022-09-16: 40 mg via INTRAVENOUS
  Filled 2022-09-16: qty 4

## 2022-09-16 MED ORDER — DARBEPOETIN ALFA 60 MCG/0.3ML IJ SOSY
60.0000 ug | PREFILLED_SYRINGE | INTRAMUSCULAR | Status: DC
  Administered 2022-09-16: 60 ug via SUBCUTANEOUS
  Filled 2022-09-16: qty 0.3

## 2022-09-16 MED ORDER — AMLODIPINE BESYLATE 5 MG PO TABS
2.5000 mg | ORAL_TABLET | Freq: Every day | ORAL | Status: DC
  Administered 2022-09-16 – 2022-09-21 (×5): 2.5 mg via ORAL
  Filled 2022-09-16 (×6): qty 1

## 2022-09-16 NOTE — ED Notes (Signed)
ED TO INPATIENT HANDOFF REPORT  ED Nurse Name and Phone #: Su Grand [phone]  S Name/Age/Gender Stacey Chambers 72 y.o. female Room/Bed: 038C/038C  Code Status   Code Status: Full Code  Home/SNF/Other Home Patient oriented to: self, place, time, and situation Is this baseline? Yes   Triage Complete: Triage complete  Chief Complaint Fluid overload [E87.70]  Triage Note Intial complaint was Diff breathing, Medic she felt hot, Pt is a dialysis pt. Pt has cough, unproductive, tachy hr, with fever.   Medic vitals: Fever 101.0 Temporal HR 114 highest RR 24 Spo2 99% 172/74 CBG 118  IV access 20g rt wrist 650 tylenol given PO    Allergies No Known Allergies  Level of Care/Admitting Diagnosis ED Disposition     ED Disposition  Admit   Condition  --   Comment  Hospital Area: [facility] [[id number]]  Level of Care: Progressive [102]  Admit to Progressive based on following criteria: NEPHROLOGY stable condition requiring close monitoring for AKI, requiring Hemodialysis or Peritoneal Dialysis either from expected electrolyte imbalance, acidosis, or fluid overload that can be managed by NIPPV or high flow oxygen.  May [facility] patient to [facility] if equivalent level of care is available:: Yes  Covid Evaluation: Confirmed COVID Positive  Diagnosis: Fluid overload [id number]  Admitting Physician: Clance Boll [id number]  Attending Physician: Clance Boll [id number]  Certification:: I certify this patient will need inpatient services for at least 2 midnights  Estimated Length of Stay: 3          B Medical/Surgery History Past Medical History:  Diagnosis Date   Anemia    Diabetes mellitus without complication ([place])    Hypertension    Vision loss    Past Surgical History:  Procedure Laterality Date   AV FISTULA PLACEMENT Left 12/23/2021   Procedure: INSERTION OF LEFT ARM BRACHIOCEPHALIC ARTERIOVENOUS FISTULA;   Surgeon: Marty Heck, MD;  Location: [facility];  Service: Vascular;  Laterality: Left;   BACK SURGERY     COLONOSCOPY     IR FLUORO GUIDE CV LINE RIGHT  12/16/2021   IR US GUIDE VASC ACCESS RIGHT  12/16/2021   UPPER GASTROINTESTINAL ENDOSCOPY       A IV Location/Drains/Wounds Patient Lines/Drains/Airways Status     Active Line/Drains/Airways     Name Placement date Placement time Site Days   Peripheral IV 09/16/22 20 G 1" Anterior;Right Forearm 09/16/22  0008  Forearm  less than 1   Peripheral IV 09/16/22 20 G Anterior;Distal;Right Wrist 09/16/22  --  Wrist  less than 1   Fistula / Graft Left Upper arm Arteriovenous fistula 12/23/21  0816  Upper arm  267   Hemodialysis Catheter Right Internal jugular Double lumen Permanent (Tunneled) 12/16/21  1052  Internal jugular  274   AIRWAYS 12/23/21  0631  -- 267            Intake/Output Last 24 hours  Intake/Output Summary (Last 24 hours) at 09/16/2022 1424 Last data filed at 09/16/2022 1124 Gross per 24 hour  Intake 292.16 ml  Output 950 ml  Net -657.84 ml    Labs/Imaging Results for orders placed or performed during the hospital encounter of 09/15/22 (from the past 48 hour(s))  CBC with Differential     Status: Abnormal   Collection Time: 09/15/22 11:51 PM  Result Value Ref Range   WBC 2.6 (L) 4.0 - 10.5 K/uL   RBC 2.60 (L) 3.87 - 5.11 MIL/uL  Hemoglobin 9.2 (L) 12.0 - 15.0 g/dL   HCT 26.9 (L) 36.0 - 46.0 %   MCV 103.5 (H) 80.0 - 100.0 fL   MCH 35.4 (H) 26.0 - 34.0 pg   MCHC 34.2 30.0 - 36.0 g/dL   RDW 14.2 11.5 - 15.5 %   Platelets 41 (L) 150 - 400 K/uL    Comment: Immature Platelet Fraction may be clinically indicated, consider ordering this additional test [id number] REPEATED TO VERIFY    nRBC 0.0 0.0 - 0.2 %   Neutrophils Relative % 59 %   Neutro Abs 1.5 (L) 1.7 - 7.7 K/uL   Lymphocytes Relative 14 %   Lymphs Abs 0.4 (L) 0.7 - 4.0 K/uL   Monocytes Relative 24 %   Monocytes Absolute 0.6 0.1 - 1.0 K/uL    Eosinophils Relative 2 %   Eosinophils Absolute 0.0 0.0 - 0.5 K/uL   Basophils Relative 1 %   Basophils Absolute 0.0 0.0 - 0.1 K/uL   Immature Granulocytes 0 %   Abs Immature Granulocytes 0.01 0.00 - 0.07 K/uL    Comment: Performed at [facility], [address] [place], [place] [address]  Troponin I (High Sensitivity)     Status: Abnormal   Collection Time: 09/15/22 11:51 PM  Result Value Ref Range   Troponin I (High Sensitivity) 63 (H) <18 ng/L    Comment: (NOTE) Elevated high sensitivity troponin I (hsTnI) values and significant  changes across serial measurements may suggest ACS but many other  chronic and acute conditions are known to elevate hsTnI results.  Refer to the "Links" section for chest pain algorithms and additional  guidance. Performed at [facility], [place] [address] [place], [place] [address]   Brain natriuretic peptide     Status: Abnormal   Collection Time: 09/15/22 11:51 PM  Result Value Ref Range   B Natriuretic Peptide 1,140.0 (H) 0.0 - 100.0 pg/mL    Comment: Performed at [facility] [address] [place], [place] [address]  Resp panel by RT-PCR (RSV, Flu A&B, Covid) Anterior Nasal Swab     Status: Abnormal   Collection Time: 09/15/22 11:51 PM   Specimen: Anterior Nasal Swab  Result Value Ref Range   SARS Coronavirus 2 by RT PCR POSITIVE (A) NEGATIVE   Influenza A by PCR NEGATIVE NEGATIVE   Influenza B by PCR NEGATIVE NEGATIVE    Comment: (NOTE) The Xpert Xpress SARS-CoV-2/FLU/RSV plus assay is intended as an aid in the diagnosis of influenza from Nasopharyngeal swab specimens and should not be used as a sole basis for treatment. Nasal washings and aspirates are unacceptable for Xpert Xpress SARS-CoV-2/FLU/RSV testing.  Fact Sheet for Patients: EntrepreneurPulse.com.au  Fact Sheet for Healthcare Providers: IncredibleEmployment.be  This test is not yet approved or cleared by the [place] FDA and has been authorized for detection and/or diagnosis of SARS-CoV-2 by FDA under an Emergency Use Authorization (EUA). This EUA will remain in effect (meaning this test can be used) for the duration of the COVID-19 declaration under Section 564(b)(1) of the Act, 21 U.S.C. section 360bbb-3(b)(1), unless the authorization is terminated or revoked.     Resp Syncytial Virus by PCR NEGATIVE NEGATIVE    Comment: (NOTE) Fact Sheet for Patients: EntrepreneurPulse.com.au  Fact Sheet for Healthcare Providers: IncredibleEmployment.be  This test is not yet approved or cleared by the [place] FDA and has been authorized for detection and/or diagnosis of SARS-CoV-2 by FDA under an Emergency Use Authorization (EUA). This  EUA will remain in effect (meaning this test can be used) for the duration of the COVID-19 declaration under Section 564(b)(1) of the Act, 21 U.S.C. section 360bbb-3(b)(1), unless the authorization is terminated or revoked.  Performed at [facility], [place] [address] [place], [place] [address]   Lactic acid, plasma     Status: None   Collection Time: 09/15/22 11:51 PM  Result Value Ref Range   Lactic Acid, Venous 1.7 0.5 - 1.9 mmol/L    Comment: Performed at [facility] [address] [place], [place] [address]  Comprehensive metabolic panel     Status: Abnormal   Collection Time: 09/15/22 11:51 PM  Result Value Ref Range   Sodium 129 (L) 135 - 145 mmol/L   Potassium 4.0 3.5 - 5.1 mmol/L   Chloride 92 (L) 98 - 111 mmol/L   CO2 25 22 - 32 mmol/L   Glucose, Bld 130 (H) 70 - 99 mg/dL    Comment: Glucose reference range applies only to samples taken after fasting for at least 8 hours.   BUN 13 8 - 23 mg/dL   Creatinine, Ser 3.21 (H) 0.44 - 1.00 mg/dL   Calcium 8.2 (L) 8.9 - 10.3 mg/dL   Total Protein 7.3 6.5 - 8.1 g/dL   Albumin 3.2 (L) 3.5 - 5.0 g/dL   AST 28 15 - 41 U/L   ALT 16 0 - 44 U/L   Alkaline  Phosphatase 100 38 - 126 U/L   Total Bilirubin 0.6 0.3 - 1.2 mg/dL   GFR, Estimated 15 (L) >60 mL/min    Comment: (NOTE) Calculated using the CKD-EPI Creatinine Equation (2021)    Anion gap 12 5 - 15    Comment: Performed at [facility], [place] [address], [place], [place] [address]  Protime-INR     Status: Abnormal   Collection Time: 09/15/22 11:51 PM  Result Value Ref Range   Prothrombin Time 17.4 (H) 11.4 - 15.2 seconds   INR 1.4 (H) 0.8 - 1.2    Comment: (NOTE) INR goal varies based on device and disease states. Performed at [facility], [place] [address] [place], [place] [address]   APTT     Status: None   Collection Time: 09/15/22 11:51 PM  Result Value Ref Range   aPTT 36 24 - 36 seconds    Comment: Performed at [facility] [address], [place], [place] [address]  Blood Culture (routine x 2)     Status: None (Preliminary result)   Collection Time: 09/15/22 11:51 PM   Specimen: BLOOD RIGHT FOREARM  Result Value Ref Range   Specimen Description BLOOD RIGHT FOREARM    Special Requests      BOTTLES DRAWN AEROBIC AND ANAEROBIC Blood Culture adequate volume   Culture      NO GROWTH < 12 HOURS Performed at [facility], [place] [address], [place], [place] [address]    Report Status PENDING   Blood Culture (routine x 2)     Status: None (Preliminary result)   Collection Time: 09/16/22 12:10 AM   Specimen: BLOOD  Result Value Ref Range   Specimen Description BLOOD SITE NOT SPECIFIED    Special Requests      BOTTLES DRAWN AEROBIC AND ANAEROBIC Blood Culture results may not be optimal due to an excessive volume of blood received in culture bottles   Culture      NO GROWTH < 12 HOURS Performed at [facility], [place] [address] [place], [place] [address]    Report Status PENDING   Troponin I (High Sensitivity)     Status: Abnormal   Collection Time: 09/16/22  3:21 AM  Result Value Ref Range   Troponin I (High Sensitivity) 57 (H) <18 ng/L     Comment: (NOTE) Elevated high sensitivity troponin I (hsTnI) values and significant  changes across serial measurements may suggest ACS but many other  chronic and acute conditions are known to elevate hsTnI results.  Refer to the "Links" section for chest pain algorithms and additional  guidance. Performed at [facility], [place] [address], [place], [place] [address]   Vitamin B12     Status: None   Collection Time: 09/16/22  5:15 AM  Result Value Ref Range   Vitamin B-12 692 180 - 914 pg/mL    Comment: (NOTE) This assay is not validated for testing neonatal or myeloproliferative syndrome specimens for Vitamin B12 levels. Performed at [facility], [place] [address], [place], [place] [address]   Folate     Status: None   Collection Time: 09/16/22  5:15 AM  Result Value Ref Range   Folate 19.7 >5.9 ng/mL    Comment: Performed at [facility] [address] [place], [place] [address]  Iron and TIBC     Status: Abnormal   Collection Time: 09/16/22  5:15 AM  Result Value Ref Range   Iron 47 28 - 170 ug/dL   TIBC 239 (L) 250 - 450 ug/dL   Saturation Ratios 20 10.4 - 31.8 %   UIBC 192 ug/dL    Comment: Performed at [facility], [place] [address], [place], [place] [address]  Ferritin     Status: Abnormal   Collection Time: 09/16/22  5:15 AM  Result Value Ref Range   Ferritin 378 (H) 11 - 307 ng/mL    Comment: Performed at [facility], [place] [address] [place], [place] [address]  Reticulocytes     Status: Abnormal   Collection Time: 09/16/22  5:15 AM  Result Value Ref Range   Retic Ct Pct 1.6 0.4 - 3.1 %   RBC. 2.64 (L) 3.87 - 5.11 MIL/uL   Retic Count, Absolute 41.7 19.0 - 186.0 K/uL   Immature Retic Fract 13.5 2.3 - 15.9 %    Comment: Performed at [facility] [address], [place], [place] [address]  CBC     Status: Abnormal   Collection Time: 09/16/22  5:15 AM  Result Value Ref Range   WBC 2.4 (L) 4.0 - 10.5 K/uL   RBC 2.62 (L) 3.87 -  5.11 MIL/uL   Hemoglobin 9.4 (L) 12.0 - 15.0 g/dL   HCT 27.1 (L) 36.0 - 46.0 %   MCV 103.4 (H) 80.0 - 100.0 fL   MCH 35.9 (H) 26.0 - 34.0 pg   MCHC 34.7 30.0 - 36.0 g/dL   RDW 14.0 11.5 - 15.5 %   Platelets 40 (L) 150 - 400 K/uL    Comment: Immature Platelet Fraction may be clinically indicated, consider ordering this additional test [id number] REPEATED TO VERIFY    nRBC 0.0 0.0 - 0.2 %    Comment: Performed at [facility], [place] [address] [place], [place] [address]  Comprehensive metabolic panel     Status: Abnormal   Collection Time: 09/16/22  5:15 AM  Result Value Ref Range   Sodium 129 (L) 135 - 145 mmol/L  Potassium 4.2 3.5 - 5.1 mmol/L   Chloride 92 (L) 98 - 111 mmol/L   CO2 24 22 - 32 mmol/L   Glucose, Bld 97 70 - 99 mg/dL    Comment: Glucose reference range applies only to samples taken after fasting for at least 8 hours.   BUN 16 8 - 23 mg/dL   Creatinine, Ser 3.50 (H) 0.44 - 1.00 mg/dL   Calcium 8.1 (L) 8.9 - 10.3 mg/dL   Total Protein 7.1 6.5 - 8.1 g/dL   Albumin 3.1 (L) 3.5 - 5.0 g/dL   AST 27 15 - 41 U/L   ALT 14 0 - 44 U/L   Alkaline Phosphatase 86 38 - 126 U/L   Total Bilirubin 1.0 0.3 - 1.2 mg/dL   GFR, Estimated 14 (L) >60 mL/min    Comment: (NOTE) Calculated using the CKD-EPI Creatinine Equation (2021)    Anion gap 13 5 - 15    Comment: Performed at [facility] [address] [place], [place] [address]  Urinalysis, w/ Reflex to Culture (Infection Suspected) -Urine, Clean Catch     Status: Abnormal   Collection Time: 09/16/22  7:30 AM  Result Value Ref Range   Specimen Source URINE, CLEAN CATCH    Color, Urine STRAW (A) YELLOW   APPearance CLEAR CLEAR   Specific Gravity, Urine 1.006 1.005 - 1.030   pH 9.0 (H) 5.0 - 8.0   Glucose, UA 50 (A) NEGATIVE mg/dL   Hgb urine dipstick MODERATE (A) NEGATIVE   Bilirubin Urine NEGATIVE NEGATIVE   Ketones, ur NEGATIVE NEGATIVE mg/dL   Protein, ur 100 (A) NEGATIVE mg/dL   Nitrite NEGATIVE NEGATIVE    Leukocytes,Ua NEGATIVE NEGATIVE   RBC / HPF 21-50 0 - 5 RBC/hpf   WBC, UA 0-5 0 - 5 WBC/hpf    Comment:        Reflex urine culture not performed if WBC <=10, OR if Squamous epithelial cells >5. If Squamous epithelial cells >5 suggest recollection.    Bacteria, UA NONE SEEN NONE SEEN   Squamous Epithelial / HPF 0-5 0 - 5 /HPF    Comment: Performed at [facility], [place] [address], [place], [place] [address]   DG Chest Portable 1 View  Result Date: 09/15/2022 CLINICAL DATA:  Shortness of breath EXAM: PORTABLE CHEST 1 VIEW COMPARISON:  12/07/2021, 12/05/2021 FINDINGS: Cardiomegaly with vascular congestion and mild interstitial pulmonary edema. Small right greater than left pleural effusions. Hazy edema or infiltrate at the right base. IMPRESSION: Cardiomegaly with vascular congestion and mild interstitial pulmonary edema. Small right greater than left pleural effusions with hazy edema or infiltrate at the right base. Electronically Signed   By: Donavan Foil M.D.   On: 09/15/2022 23:56    Pending Labs Unresulted Labs (From admission, onward)     Start     Ordered   09/17/22 0500  Renal function panel  Tomorrow morning,   R        09/16/22 1051   09/17/22 0500  CBC  Tomorrow morning,   R        09/16/22 1051            Vitals/Pain Today's Vitals   09/16/22 1124 09/16/22 1128 09/16/22 1200 09/16/22 1300  BP:   (!) 155/86 (!) 166/95  Pulse:   81 87  Resp:   17 (!) 24  Temp: 98.2 F (36.8 C)     TempSrc: Oral     SpO2:   94% 94%  Weight:      Height:      PainSc:  0-No pain      Isolation Precautions Airborne and Contact precautions  Medications Medications  acetaminophen (TYLENOL) tablet 1,000 mg (0 mg Oral Hold 09/16/22 0242)  levothyroxine (SYNTHROID) tablet 25 mcg (25 mcg Oral Given 09/16/22 0602)  melatonin tablet 3 mg (has no administration in time range)  pantoprazole (PROTONIX) EC tablet 40 mg (40 mg Oral Given 09/16/22 0954)  multivitamin (RENA-VIT)  tablet 1 tablet (has no administration in time range)  metoprolol succinate (TOPROL-XL) 24 hr tablet 50 mg (50 mg Oral Given 09/16/22 0953)  losartan (COZAAR) tablet 50 mg (50 mg Oral Given 09/16/22 0954)  amLODipine (NORVASC) tablet 2.5 mg (2.5 mg Oral Given 09/16/22 0954)  heparin injection 5,000 Units (5,000 Units Subcutaneous Given 09/16/22 1410)  furosemide (LASIX) injection 80 mg (80 mg Intravenous Given 09/16/22 0603)  albuterol (PROVENTIL) (2.5 MG/3ML) 0.083% nebulizer solution 2.5 mg (has no administration in time range)  ondansetron (ZOFRAN) tablet 4 mg (has no administration in time range)    Or  ondansetron (ZOFRAN) injection 4 mg (has no administration in time range)  acetaminophen (TYLENOL) tablet 650 mg (650 mg Oral Given 09/16/22 0602)    Or  acetaminophen (TYLENOL) suppository 650 mg ( Rectal See Alternative 09/16/22 0602)  remdesivir 200 mg in sodium chloride 0.9% 250 mL IVPB (0 mg Intravenous Stopped 09/16/22 0722)    Followed by  remdesivir 100 mg in sodium chloride 0.9 % 100 mL IVPB (has no administration in time range)  methylPREDNISolone sodium succinate (SOLU-MEDROL) 125 mg/2 mL injection 66.875 mg (66.875 mg Intravenous Given 09/16/22 0603)    Followed by  predniSONE (DELTASONE) tablet 50 mg (has no administration in time range)  doxercalciferol (HECTOROL) injection 3 mcg (has no administration in time range)  Darbepoetin Alfa (ARANESP) injection 60 mcg (has no administration in time range)  ceFEPIme (MAXIPIME) 2 g in sodium chloride 0.9 % 100 mL IVPB (0 g Intravenous Stopped 09/16/22 0043)  metroNIDAZOLE (FLAGYL) IVPB 500 mg (0 mg Intravenous Stopped 09/16/22 0237)  vancomycin (VANCOREADY) IVPB 1250 mg/250 mL (0 mg Intravenous Stopped 09/16/22 0338)  furosemide (LASIX) injection 40 mg (40 mg Intravenous Given 09/16/22 0205)    Mobility walks     Focused Assessments Pulmonary Assessment Handoff:  Lung sounds: Bilateral Breath Sounds: Expiratory wheezes O2 Device: Room  Air      R Recommendations: See Admitting Provider Note  Report given to:   Additional Notes:

## 2022-09-16 NOTE — Progress Notes (Signed)
Elink monitoring for the code sepsis protocol.  

## 2022-09-16 NOTE — Consult Note (Signed)
[facility] Renal Consultation Note  Requesting MD: Eleonore Chiquito, MD Indication for Consultation:  ESRD  Chief complaint: shortness of breath   HPI:  Stacey Chambers is a 72 y.o. female with a history of HTN, type 2 DM, and ESRD on HD who presented to the hospital with shortness of breath.  She was found to be covid positive.  Nephrology is consulted for dialysis needs and ESRD care.  The patient is still in the ER.  Reported as still making urine and was given lasix overnight.  No urine output is charted however purewick is near full.  She feels like her breathing has gotten better.  She was initially on oxygen however this has been taken off.  She confirms MWF HD at [facility] and has been going to treatments.  I used a video [name] interpreter at the bedside during our visit; appreciate ER staff locating this device for me.    PMHx:   Past Medical History:  Diagnosis Date   Anemia    Diabetes mellitus without complication ([place])    Hypertension    Vision loss     Past Surgical History:  Procedure Laterality Date   AV FISTULA PLACEMENT Left 12/23/2021   Procedure: INSERTION OF LEFT ARM BRACHIOCEPHALIC ARTERIOVENOUS FISTULA;  Surgeon: Marty Heck, MD;  Location: [facility];  Service: Vascular;  Laterality: Left;   BACK SURGERY     COLONOSCOPY     IR FLUORO GUIDE CV LINE RIGHT  12/16/2021   IR US GUIDE VASC ACCESS RIGHT  12/16/2021   UPPER GASTROINTESTINAL ENDOSCOPY      Family Hx:  Family History  Problem Relation Age of Onset   Diabetes Brother    Colon cancer Neg Hx    Esophageal cancer Neg Hx    Stomach cancer Neg Hx    Pancreatic cancer Neg Hx    Inflammatory bowel disease Neg Hx    Liver disease Neg Hx    Rectal cancer Neg Hx    Colon polyps Neg Hx   No family history of ESRD  Social History:  reports that she has never smoked. She has never used smokeless tobacco. She reports that she does not currently use drugs after having used the following  drugs: Marijuana. She reports that she does not drink alcohol.  Allergies: No Known Allergies  Medications: Prior to Admission medications   Medication Sig Start Date End Date Taking? Authorizing Provider  acetaminophen (TYLENOL) 500 MG tablet Take 500 mg by mouth every 8 (eight) hours as needed for moderate pain.   Yes [provider]  amLODipine (NORVASC) 2.5 MG tablet Take 2.5 mg by mouth daily. 11/25/21  Yes [provider]  B Complex-C-Zn-Folic Acid (DIALYVITE [id number] WITH ZINC) 0.8 MG TABS Take 1 tablet by mouth at bedtime. 03/07/22  Yes [provider]  levothyroxine (SYNTHROID) 25 MCG tablet Take 25 mcg by mouth daily. 11/17/21  Yes [provider]  lidocaine-prilocaine (EMLA) cream Apply 1 Application topically 3 (three) times a week.   Yes [provider]  losartan (COZAAR) 50 MG tablet Take 50 mg by mouth daily.   Yes [provider]  metoprolol succinate (TOPROL-XL) 50 MG 24 hr tablet Take 50 mg by mouth daily. 10/20/21  Yes [provider]  mirtazapine (REMERON) 7.5 MG tablet Take 7.5 mg by mouth at bedtime.   Yes [provider]  sevelamer carbonate (RENVELA) 800 MG tablet Take 800 mg by mouth 3 (three) times daily. 03/09/22  Yes [provider]  ACCU-CHEK GUIDE test strip  02/01/21   [provider]  Accu-Chek Softclix Lancets lancets  01/31/21   [provider]  esomeprazole (NEXIUM) 40 MG capsule Take 1 capsule (40 mg total) by mouth 2 (two) times daily. Patient not taking: Reported on 09/16/2022 09/13/21   Mansouraty, Telford Nab., MD  ferrous gluconate (FERGON) 324 MG tablet Take 1 tablet (324 mg total) by mouth daily with breakfast. Patient not taking: Reported on 09/16/2022 09/13/21   Mansouraty, Telford Nab., MD    I have reviewed the patient's current and reported prior to admission medications.  Labs:     Latest Ref Rng & Units 09/16/2022    5:15 AM 09/15/2022   11:51 PM 07/07/2022    9:55  AM  BMP  Glucose 70 - 99 mg/dL 97  130  106   BUN 8 - 23 mg/dL 16  13  24    Creatinine 0.44 - 1.00 mg/dL 3.50  3.21  3.82   Sodium 135 - 145 mmol/L 129  129  131   Potassium 3.5 - 5.1 mmol/L 4.2  4.0  4.4   Chloride 98 - 111 mmol/L 92  92  95   CO2 22 - 32 mmol/L 24  25  24    Calcium 8.9 - 10.3 mg/dL 8.1  8.2  9.1     Urinalysis    Component Value Date/Time   COLORURINE STRAW (A) 09/16/2022 0730   APPEARANCEUR CLEAR 09/16/2022 0730   LABSPEC 1.006 09/16/2022 0730   PHURINE 9.0 (H) 09/16/2022 0730   GLUCOSEU 50 (A) 09/16/2022 0730   HGBUR MODERATE (A) 09/16/2022 0730   BILIRUBINUR NEGATIVE 09/16/2022 0730   KETONESUR NEGATIVE 09/16/2022 0730   PROTEINUR 100 (A) 09/16/2022 0730   NITRITE NEGATIVE 09/16/2022 0730   LEUKOCYTESUR NEGATIVE 09/16/2022 0730     ROS:  Pertinent items noted in HPI and remainder of comprehensive ROS otherwise negative.  Physical Exam: Vitals:   09/16/22 0900 09/16/22 1000  BP: (!) 169/74 (!) 190/95  Pulse: 90 93  Resp: 18 17  Temp:    SpO2: 91% 96%     General: adult female in stretcher in NAD  HEENT: NCAT Eyes: EOMI sclera anicteric Neck: supple trachea midline  Heart: S1S2 no rub tachy Lungs:few basilar crackles; unlabored at rest on room air  Abdomen: soft/nt/nd Extremities: no edema appreciated; no cyanosis or clubbing Skin: no rash on extremities exposed  Neuro: alert and conversant. provides hx, follows commands Psych: normal mood and affect Access: LUE AVF bruit and thrill   Outpatient HD orders:  [facility] MWF 3.5 hours  400 BF and DF auto 1.5 EDW 62.7 kg 2K/2 calcium AVF Last post weight 62.9 kg on 3/15 Meds: hectorol 3 mcg three times a week with HD.  Not currently ordered ESA but Mircera 50 mcg last given on 08/28/22.  She is not on heparin   Assessment/Plan:  # ESRD - HD per MWF schedule.  Next HD Monday (here vs outpatient) - Lasix 80 mg IV is ordered - can continue while here  - Would  continue to lower her EDW outpatient given covid   # HTN  - Lower EDW as tolerated/optimize volume status with HD - Continue current regimen  - note also now on steroids    # Covid 19 PNA - Per primary team  - she is on steroids and remdesivir   # Anemia CKD  - Will order ESA to resume - aranesp at  60 mcg weekly on Saturdays  - CBC in AM  # Metabolic bone disease - continue hectorol here - can continue renvela  - phos in AM   # Hyponatremia - Setting of ESRD and impaired free water excretion  - Follow with HD  # Thrombocytopenia  - Per primary team - not on heparin with HD  Disposition per primary team.    Claudia Desanctis 09/16/2022,11:32 AM

## 2022-09-16 NOTE — H&P (Addendum)
History and Physical    Stacey Chambers [id number] DOB: [birth year] DOA: 09/15/2022  PCP: Horald Pollen, MD  Patient coming from: home  I have personally briefly reviewed patient's old medical records in [place]  Chief Complaint: fever , nonproductive cough , palpitations  HPI: Stacey Chambers is a 72 y.o. female with medical history significant of Anemia, DMII, HTN,  hypothyrodism, ESRD MWF with last HD 24 hours ago for full session, who presents BIB EMS with complaint of cough nonproductive and fever.  She notes no chest pain but notes chills and muscle aches. Notes she was in her normal state of health 2 days ago.  She denies n/v/d/sob/chest pain currently.   ED Course:  Temp 101 ,BP 171/80, sat 96%  rr 21 INR 1.4 , Lactic 1.7 NA 129, K4,  glu 130, cr 3.21 (at baseline) Wbc 2.6, hgb 9.2 (10.7) mcv 103 CE 63 Covid :+  Desat 89%   EKG sinus LAE  Cxr IMPRESSION: Cardiomegaly with vascular congestion and mild interstitial pulmonary edema. Small right greater than left pleural effusions with hazy edema or infiltrate at the right base.  Tx vanc,cefepime, metronidazole, lasic 40mg   Review of Systems: As per HPI otherwise 10 point review of systems negative.   Past Medical History:  Diagnosis Date   Anemia    Diabetes mellitus without complication ([place])    Hypertension    Vision loss     Past Surgical History:  Procedure Laterality Date   AV FISTULA PLACEMENT Left 12/23/2021   Procedure: INSERTION OF LEFT ARM BRACHIOCEPHALIC ARTERIOVENOUS FISTULA;  Surgeon: Marty Heck, MD;  Location: [facility];  Service: Vascular;  Laterality: Left;   BACK SURGERY     COLONOSCOPY     IR FLUORO GUIDE CV LINE RIGHT  12/16/2021   IR US GUIDE VASC ACCESS RIGHT  12/16/2021   UPPER GASTROINTESTINAL ENDOSCOPY       reports that she has never smoked. She has never used smokeless tobacco. She reports that she does not currently use drugs after having used  the following drugs: Marijuana. She reports that she does not drink alcohol.  No Known Allergies  Family History  Problem Relation Age of Onset   Diabetes Brother    Colon cancer Neg Hx    Esophageal cancer Neg Hx    Stomach cancer Neg Hx    Pancreatic cancer Neg Hx    Inflammatory bowel disease Neg Hx    Liver disease Neg Hx    Rectal cancer Neg Hx    Colon polyps Neg Hx    Prior to Admission medications   Medication Sig Start Date End Date Taking? Authorizing Provider  acetaminophen (TYLENOL) 500 MG tablet Take 500 mg by mouth every 8 (eight) hours as needed for moderate pain.   Yes [provider]  amLODipine (NORVASC) 2.5 MG tablet Take 2.5 mg by mouth daily. 11/25/21  Yes [provider]  B Complex-C-Zn-Folic Acid (DIALYVITE Q000111Q WITH ZINC) 0.8 MG TABS Take 1 tablet by mouth at bedtime. 03/07/22  Yes [provider]  levothyroxine (SYNTHROID) 25 MCG tablet Take 25 mcg by mouth daily. 11/17/21  Yes [provider]  lidocaine-prilocaine (EMLA) cream Apply 1 Application topically 3 (three) times a week.   Yes [provider]  losartan (COZAAR) 50 MG tablet Take 50 mg by mouth daily.   Yes [provider]  metoprolol succinate (TOPROL-XL) 50 MG 24 hr tablet Take 50 mg by mouth daily. 10/20/21  Yes  [provider]  mirtazapine (REMERON) 7.5 MG tablet Take 7.5 mg by mouth at bedtime.   Yes [provider]  sevelamer carbonate (RENVELA) 800 MG tablet Take 800 mg by mouth 3 (three) times daily. 03/09/22  Yes [provider]  ACCU-CHEK GUIDE test strip  02/01/21   [provider]  Accu-Chek Softclix Lancets lancets  01/31/21   [provider]  esomeprazole (NEXIUM) 40 MG capsule Take 1 capsule (40 mg total) by mouth 2 (two) times daily. Patient not taking: Reported on 09/16/2022 09/13/21   Mansouraty, Telford Nab., MD  ferrous gluconate (FERGON) 324 MG tablet Take 1 tablet (324 mg total) by mouth daily with  breakfast. Patient not taking: Reported on 09/16/2022 09/13/21   Mansouraty, Telford Nab., MD    Physical Exam: Vitals:   09/16/22 0108 09/16/22 0230 09/16/22 0300 09/16/22 0315  BP:   (!) 157/70 (!) 150/63  Pulse:   84 88  Resp:    (!) 24  Temp: 99 F (37.2 C)     TempSrc: Oral     SpO2:  94%  (!) 89%  Weight:      Height:        Constitutional: NAD, calm, comfortable Vitals:   09/16/22 0108 09/16/22 0230 09/16/22 0300 09/16/22 0315  BP:   (!) 157/70 (!) 150/63  Pulse:   84 88  Resp:    (!) 24  Temp: 99 F (37.2 C)     TempSrc: Oral     SpO2:  94%  (!) 89%  Weight:      Height:       Eyes: PERRL, lids and conjunctivae normal ENMT: Mucous membranes are moist. Posterior pharynx clear of any exudate or lesions.Normal dentition.  Neck: normal, supple, no masses, no thyromegaly Respiratory: clear to auscultation bilaterally, no wheezing, no crackles. Normal respiratory effort. No accessory muscle use.  Cardiovascular: Regular rate and rhythm, no murmurs / rubs / gallops. No extremity edema. 2+ pedal pulses. Abdomen: no tenderness, no masses palpated. No hepatosplenomegaly. Bowel sounds positive.  Musculoskeletal: no clubbing / cyanosis. No joint deformity upper and lower extremities. Good ROM, no contractures. Normal muscle tone.  Skin: no rashes, lesions, ulcers. No induration Neurologic: CN 2-12 grossly intact. Sensation intact,. Strength 5/5 in all 4.  Psychiatric: Normal judgment and insight. Alert and oriented x 3. Normal mood.    Labs on Admission: I have personally reviewed following labs and imaging studies  CBC: Recent Labs  Lab 09/15/22 2351  WBC 2.6*  NEUTROABS 1.5*  HGB 9.2*  HCT 26.9*  MCV 103.5*  PLT 41*   Basic Metabolic Panel: Recent Labs  Lab 09/15/22 2351  NA 129*  K 4.0  CL 92*  CO2 25  GLUCOSE 130*  BUN 13  CREATININE 3.21*  CALCIUM 8.2*   GFR: Estimated Creatinine Clearance: 13.7 mL/min (A) (by C-G formula based on SCr of 3.21 mg/dL  (H)). Liver Function Tests: Recent Labs  Lab 09/15/22 2351  AST 28  ALT 16  ALKPHOS 100  BILITOT 0.6  PROT 7.3  ALBUMIN 3.2*   No results for input(s): "LIPASE", "AMYLASE" in the last 168 hours. No results for input(s): "AMMONIA" in the last 168 hours. Coagulation Profile: Recent Labs  Lab 09/15/22 2351  INR 1.4*   Cardiac Enzymes: No results for input(s): "CKTOTAL", "CKMB", "CKMBINDEX", "TROPONINI" in the last 168 hours. BNP (last 3 results) No results for input(s): "PROBNP" in the last 8760 hours. HbA1C: No results for input(s): "HGBA1C" in the last  72 hours. CBG: No results for input(s): "GLUCAP" in the last 168 hours. Lipid Profile: No results for input(s): "CHOL", "HDL", "LDLCALC", "TRIG", "CHOLHDL", "LDLDIRECT" in the last 72 hours. Thyroid Function Tests: No results for input(s): "TSH", "T4TOTAL", "FREET4", "T3FREE", "THYROIDAB" in the last 72 hours. Anemia Panel: No results for input(s): "VITAMINB12", "FOLATE", "FERRITIN", "TIBC", "IRON", "RETICCTPCT" in the last 72 hours. Urine analysis:    Component Value Date/Time   COLORURINE YELLOW 12/01/2021 1430   APPEARANCEUR CLEAR 12/01/2021 1430   LABSPEC 1.008 12/01/2021 1430   PHURINE 6.0 12/01/2021 1430   GLUCOSEU NEGATIVE 12/01/2021 1430   HGBUR MODERATE (A) 12/01/2021 1430   BILIRUBINUR NEGATIVE 12/01/2021 1430   KETONESUR NEGATIVE 12/01/2021 1430   PROTEINUR >=300 (A) 12/01/2021 1430   NITRITE NEGATIVE 12/01/2021 1430   LEUKOCYTESUR NEGATIVE 12/01/2021 1430    Radiological Exams on Admission: DG Chest Portable 1 View  Result Date: 09/15/2022 CLINICAL DATA:  Shortness of breath EXAM: PORTABLE CHEST 1 VIEW COMPARISON:  12/07/2021, 12/05/2021 FINDINGS: Cardiomegaly with vascular congestion and mild interstitial pulmonary edema. Small right greater than left pleural effusions. Hazy edema or infiltrate at the right base. IMPRESSION: Cardiomegaly with vascular congestion and mild interstitial pulmonary edema.  Small right greater than left pleural effusions with hazy edema or infiltrate at the right base. Electronically Signed   By: Donavan Foil M.D.   On: 09/15/2022 23:56    EKG: Independently reviewed.  Assessment/Plan  Fluid Overload in setting of ESRD MWF with hypoxemia -had full session of HD 3/15  -lasix iv as patient still makes uriine  - nephrology consult     COVID viral Pneumonia with associated hypoxic respiratory failure  -antiviral /steroids per protocol  -daily monitoring of COVID labs  -pulmonary toilet -d-dimer  -dvt ppx per protocol     Anemia -slight drop in h/h -will monitor  -anemia lab pending    DMII -last A1c 5.1  -diet ADA - of medications currently    Hypothyrodism -resume synthronic   ESRD MWF -nephrology consult  Renal Lesion new finding -will need f/u with [facility]  DVT prophylaxis: heparin Code Status: full/ as discussed per patient wishes in event of cardiac arrest  Family Communication: none at bedside Disposition Plan: patient  expected to be admitted greater than 2 midnights  Consults called: n/a Admission status: progressive   Clance Boll MD [facility]   If 7PM-7AM, please contact night-coverage [email or web address] Password [facility]  09/16/2022, 4:04 AM

## 2022-09-16 NOTE — ED Provider Notes (Signed)
[facility] Provider Note   CSN: [id number] Arrival date & time: 09/15/22  2329     History  Chief Complaint  Patient presents with   Shortness of Breath    Stacey Chambers is a 72 y.o. female.  The history is provided by the patient and the EMS personnel.  Shortness of Breath Severity:  Severe Onset quality:  Gradual Duration:  2 days Timing:  Constant Progression:  Worsening Chronicity:  Recurrent Context: not fumes   Relieved by:  Nothing Worsened by:  Nothing Ineffective treatments: dilaysis. Associated symptoms: fever   Associated symptoms: no vomiting   Risk factors comment:  Dialysis and had a CT showing pulmonary edema, effusions and ascites as well as a renal mass. Patient with ESRD MWF and on transplant list presents with SOB and volume overload after dialysis and A CT demonstrating pleural effusions edema, ascites and renal mass.  Also cough and now fever per EMS.      Past Medical History:  Diagnosis Date   Anemia    Diabetes mellitus without complication ([place])    Hypertension    Vision loss      Home Medications Prior to Admission medications   Medication Sig Start Date End Date Taking? Authorizing Provider  ACCU-CHEK GUIDE test strip  02/01/21   [provider]  Accu-Chek Softclix Lancets lancets  01/31/21   [provider]  amLODipine (NORVASC) 2.5 MG tablet Take 2.5 mg by mouth daily. 11/25/21   [provider]  atorvastatin (LIPITOR) 20 MG tablet Take 20 mg by mouth daily. Patient not taking: Reported on 07/12/2022 11/25/21   [provider]  B Complex-C-Zn-Folic Acid (DIALYVITE Q000111Q WITH ZINC) 0.8 MG TABS Take 1 tablet by mouth at bedtime. 03/07/22   [provider]  esomeprazole (NEXIUM) 40 MG capsule Take 1 capsule (40 mg total) by mouth 2 (two) times daily. 09/13/21   Mansouraty, Telford Nab., MD  ferrous gluconate (FERGON) 324 MG tablet Take 1 tablet (324 mg  total) by mouth daily with breakfast. 09/13/21   Mansouraty, Telford Nab., MD  levothyroxine (SYNTHROID) 25 MCG tablet Take 25 mcg by mouth daily. 11/17/21   [provider]  metoprolol succinate (TOPROL-XL) 50 MG 24 hr tablet Take 50 mg by mouth daily. 10/20/21   [provider]  sevelamer carbonate (RENVELA) 800 MG tablet Take 800 mg by mouth 3 (three) times daily. 03/09/22   [provider]  traMADol Veatrice Bourbon) 50 MG tablet  07/05/22   [provider]  Vitamin D, Ergocalciferol, (DRISDOL) 1.25 MG (50000 UNIT) CAPS capsule Take 50,000 Units by mouth once a week. Wednesday 10/25/21   [provider]      Allergies    Patient has no known allergies.    Review of Systems   Review of Systems  Unable to perform ROS: Acuity of condition  Constitutional:  Positive for fever.  HENT:  Negative for facial swelling.   Eyes:  Negative for redness.  Respiratory:  Positive for shortness of breath.   Gastrointestinal:  Positive for abdominal distention. Negative for vomiting.    Physical Exam Updated Vital Signs BP (!) 171/80   Pulse 89   Temp (!) 101 F (38.3 C) (Rectal)   Resp (!) 21   Ht 4\' 11"  (1.499 m)   Wt 66.6 kg   SpO2 96%   BMI 29.66 kg/m  Physical Exam Vitals and nursing note reviewed. Exam conducted with a chaperone present.  Constitutional:  General: She is not in acute distress.    Appearance: Normal appearance. She is well-developed.  HENT:     Head: Normocephalic and atraumatic.  Eyes:     Pupils: Pupils are equal, round, and reactive to light.  Cardiovascular:     Rate and Rhythm: Normal rate and regular rhythm.     Pulses: Normal pulses.     Heart sounds: Normal heart sounds.  Pulmonary:     Effort: Pulmonary effort is normal. No respiratory distress.     Breath sounds: Examination of the right-lower field reveals decreased breath sounds. Examination of the left-lower field reveals decreased breath sounds. Decreased breath  sounds, wheezing and rales present.  Abdominal:     General: Bowel sounds are normal. There is no distension.     Palpations: Abdomen is soft. There is fluid wave.     Tenderness: There is no abdominal tenderness. There is no guarding or rebound. Negative signs include Murphy's sign and McBurney's sign.  Genitourinary:    Vagina: No vaginal discharge.  Musculoskeletal:        General: Normal range of motion.     Cervical back: Normal range of motion and neck supple.  Skin:    General: Skin is warm and dry.     Capillary Refill: Capillary refill takes less than 2 seconds.     Findings: No erythema or rash.  Neurological:     General: No focal deficit present.     Mental Status: She is alert.     Deep Tendon Reflexes: Reflexes normal.  Psychiatric:        Mood and Affect: Mood normal.        Behavior: Behavior normal.     ED Results / Procedures / Treatments   Labs (all labs ordered are listed, but only abnormal results are displayed) Results for orders placed or performed during the hospital encounter of 09/15/22  Resp panel by RT-PCR (RSV, Flu A&B, Covid) Anterior Nasal Swab   Specimen: Anterior Nasal Swab  Result Value Ref Range   SARS Coronavirus 2 by RT PCR POSITIVE (A) NEGATIVE   Influenza A by PCR NEGATIVE NEGATIVE   Influenza B by PCR NEGATIVE NEGATIVE   Resp Syncytial Virus by PCR NEGATIVE NEGATIVE  CBC with Differential  Result Value Ref Range   WBC 2.6 (L) 4.0 - 10.5 K/uL   RBC 2.60 (L) 3.87 - 5.11 MIL/uL   Hemoglobin 9.2 (L) 12.0 - 15.0 g/dL   HCT 26.9 (L) 36.0 - 46.0 %   MCV 103.5 (H) 80.0 - 100.0 fL   MCH 35.4 (H) 26.0 - 34.0 pg   MCHC 34.2 30.0 - 36.0 g/dL   RDW 14.2 11.5 - 15.5 %   Platelets 41 (L) 150 - 400 K/uL   nRBC 0.0 0.0 - 0.2 %   Neutrophils Relative % 59 %   Neutro Abs 1.5 (L) 1.7 - 7.7 K/uL   Lymphocytes Relative 14 %   Lymphs Abs 0.4 (L) 0.7 - 4.0 K/uL   Monocytes Relative 24 %   Monocytes Absolute 0.6 0.1 - 1.0 K/uL   Eosinophils Relative  2 %   Eosinophils Absolute 0.0 0.0 - 0.5 K/uL   Basophils Relative 1 %   Basophils Absolute 0.0 0.0 - 0.1 K/uL   Immature Granulocytes 0 %   Abs Immature Granulocytes 0.01 0.00 - 0.07 K/uL  Brain natriuretic peptide  Result Value Ref Range   B Natriuretic Peptide 1,140.0 (H) 0.0 - 100.0 pg/mL  Lactic acid, plasma  Result Value Ref Range   Lactic Acid, Venous 1.7 0.5 - 1.9 mmol/L  Comprehensive metabolic panel  Result Value Ref Range   Sodium 129 (L) 135 - 145 mmol/L   Potassium 4.0 3.5 - 5.1 mmol/L   Chloride 92 (L) 98 - 111 mmol/L   CO2 25 22 - 32 mmol/L   Glucose, Bld 130 (H) 70 - 99 mg/dL   BUN 13 8 - 23 mg/dL   Creatinine, Ser 3.21 (H) 0.44 - 1.00 mg/dL   Calcium 8.2 (L) 8.9 - 10.3 mg/dL   Total Protein 7.3 6.5 - 8.1 g/dL   Albumin 3.2 (L) 3.5 - 5.0 g/dL   AST 28 15 - 41 U/L   ALT 16 0 - 44 U/L   Alkaline Phosphatase 100 38 - 126 U/L   Total Bilirubin 0.6 0.3 - 1.2 mg/dL   GFR, Estimated 15 (L) >60 mL/min   Anion gap 12 5 - 15  Protime-INR  Result Value Ref Range   Prothrombin Time 17.4 (H) 11.4 - 15.2 seconds   INR 1.4 (H) 0.8 - 1.2  APTT  Result Value Ref Range   aPTT 36 24 - 36 seconds  Troponin I (High Sensitivity)  Result Value Ref Range   Troponin I (High Sensitivity) 63 (H) <18 ng/L   DG Chest Portable 1 View  Result Date: 09/15/2022 CLINICAL DATA:  Shortness of breath EXAM: PORTABLE CHEST 1 VIEW COMPARISON:  12/07/2021, 12/05/2021 FINDINGS: Cardiomegaly with vascular congestion and mild interstitial pulmonary edema. Small right greater than left pleural effusions. Hazy edema or infiltrate at the right base. IMPRESSION: Cardiomegaly with vascular congestion and mild interstitial pulmonary edema. Small right greater than left pleural effusions with hazy edema or infiltrate at the right base. Electronically Signed   By: Donavan Foil M.D.   On: 09/15/2022 23:56    EKG EKG Interpretation  Date/Time:  Friday September 15 2022 23:32:18 EDT Ventricular Rate:  89 PR  Interval:  143 QRS Duration: 130 QT Interval:  398 QTC Calculation: 485 R Axis:   89 Text Interpretation: Sinus rhythm Probable left atrial enlargement Right bundle branch block Confirmed by Randal Buba, Audley Hinojos ([id number]) on 09/15/2022 11:36:43 PM  Radiology DG Chest Portable 1 View  Result Date: 09/15/2022 CLINICAL DATA:  Shortness of breath EXAM: PORTABLE CHEST 1 VIEW COMPARISON:  12/07/2021, 12/05/2021 FINDINGS: Cardiomegaly with vascular congestion and mild interstitial pulmonary edema. Small right greater than left pleural effusions. Hazy edema or infiltrate at the right base. IMPRESSION: Cardiomegaly with vascular congestion and mild interstitial pulmonary edema. Small right greater than left pleural effusions with hazy edema or infiltrate at the right base. Electronically Signed   By: Donavan Foil M.D.   On: 09/15/2022 23:56    Procedures Procedures    Medications Ordered in ED Medications  ceFEPIme (MAXIPIME) 2 g in sodium chloride 0.9 % 100 mL IVPB (has no administration in time range)  metroNIDAZOLE (FLAGYL) IVPB 500 mg (has no administration in time range)  vancomycin (VANCOREADY) IVPB 1250 mg/250 mL (has no administration in time range)    ED Course/ Medical Decision Making/ A&P                             Medical Decision Making Patient with SOB with fluid overload but had dialysis earlier in the day.  Now with fever also   Problems Addressed: Acute pulmonary edema [facility]):    Details: Will diurese  Other ascites:    Details:  Likely secondary to volume overload  Sepsis, due to unspecified organism, unspecified whether acute organ dysfunction present [facility]):    Details: Antibiotics and admission.  Cannot give IVF due to volume overload in a dialysis patient   Amount and/or Complexity of Data Reviewed External Data Reviewed: labs, radiology and notes.    Details: Previous outside notes and radiology reports with labs reviewed  Labs: ordered.    Details: All labs reviewed:   normal lactate 1.7, COVID is positive.  White count low 2.6, low hemoglobin 9.2, low platelets 41.  Elevated BNP 1140  Radiology: ordered and independent interpretation performed.    Details: Pulmonary edema with effusions on CXR by me  ECG/medicine tests: ordered and independent interpretation performed. Decision-making details documented in ED Course. Discussion of management or test interpretation with external provider(s): Consulted renal per hospitalist, awaiting call back.    Risk OTC drugs. Prescription drug management. Decision regarding hospitalization. Risk Details: Patient with ESRD MWF with full load removed with volume overload and now fever presents to the ED for ongoing care.    Critical Care Total time providing critical care: 60 minutes (Sepsis bundle complexity of care and probably of serious outcomes )   CRITICAL CARE Performed by: Tamirah George K Yaretsi Humphres-Rasch Total critical care time: 60 minutes Critical care time was exclusive of separately billable procedures and treating other patients. Critical care was necessary to treat or prevent imminent or life-threatening deterioration. Critical care was time spent personally by me on the following activities: development of treatment plan with patient and/or surrogate as well as nursing, discussions with consultants, evaluation of patient's response to treatment, examination of patient, obtaining history from patient or surrogate, ordering and performing treatments and interventions, ordering and review of laboratory studies, ordering and review of radiographic studies, pulse oximetry and re-evaluation of patient's condition.  Final Clinical Impression(s) / ED Diagnoses Final diagnoses:  Acute pulmonary edema (HCC)  Pleural effusion  Sepsis, due to unspecified organism, unspecified whether acute organ dysfunction present [facility])   The patient appears reasonably stabilized for admission considering the current resources, flow, and  capabilities available in the ED at this time, and I doubt any other [facility] requiring further screening and/or treatment in the ED prior to admission.  Rx / DC Orders ED Discharge Orders     None         Navika Hoopes, MD 09/16/22 0345

## 2022-09-16 NOTE — Progress Notes (Signed)
Subjective: Patient admitted this morning, see detailed H&P by Dr Marcello Moores 72 year old female with history of ESRD on hemodialysis MWF, diabetes mellitus type 2, hypothyroidism presented with fever, nonproductive cough.  Symptoms started after dialysis.  CT chest showed pleural effusion, edema and ascites with renal mass.  CT chest obtained at [facility] 1.  Moderate pleural effusions with some associated atelectasis in the lower lobes. There are also areas of atelectasis in the middle lobe and lingula.  2.  Pericardial effusion. Minimal coronary calcification  suspected.  3.  Ascites. Possible cirrhosis.  4.  Small mass anterior lower left kidney, not obviously a simple cyst. Further evaluation needed to exclude malignancy.  5.  Gallstones.   Patient found to be COVID-positive Vitals:   09/16/22 0728 09/16/22 0800  BP:  (!) 177/73  Pulse:  92  Resp:  19  Temp: 98.5 F (36.9 C)   SpO2:  96%      A/P ESRD on dialysis COVID-19 infection Renal mass  Nephrology has been consulted for dialysis Will need MRI of kidney to assess renal mass once more stable Continue COVID-19 protocol    [name] [facility]

## 2022-09-16 NOTE — ED Provider Notes (Incomplete)
[facility] Provider Note   CSN: [id number] Arrival date & time: 09/15/22  2329     History {Add pertinent medical, surgical, social history, OB history to HPI:1} Chief Complaint  Patient presents with  . Shortness of Breath    Stacey Chambers is a 72 y.o. female.  The history is provided by the patient and the EMS personnel.  Shortness of Breath Severity:  Severe Onset quality:  Gradual Duration:  2 days Timing:  Constant Progression:  Worsening Chronicity:  Recurrent Context: not fumes   Relieved by:  Nothing Worsened by:  Nothing Ineffective treatments: dilaysis. Associated symptoms: fever   Associated symptoms: no vomiting   Risk factors comment:  Dialysis and had a CT showing pulmonary edema, effusions and ascites as well as a renal mass.      Home Medications Prior to Admission medications   Medication Sig Start Date End Date Taking? Authorizing Provider  ACCU-CHEK GUIDE test strip  02/01/21   [provider]  Accu-Chek Softclix Lancets lancets  01/31/21   [provider]  amLODipine (NORVASC) 2.5 MG tablet Take 2.5 mg by mouth daily. 11/25/21   [provider]  atorvastatin (LIPITOR) 20 MG tablet Take 20 mg by mouth daily. Patient not taking: Reported on 07/12/2022 11/25/21   [provider]  B Complex-C-Zn-Folic Acid (DIALYVITE Q000111Q WITH ZINC) 0.8 MG TABS Take 1 tablet by mouth at bedtime. 03/07/22   [provider]  esomeprazole (NEXIUM) 40 MG capsule Take 1 capsule (40 mg total) by mouth 2 (two) times daily. 09/13/21   Mansouraty, Telford Nab., MD  ferrous gluconate (FERGON) 324 MG tablet Take 1 tablet (324 mg total) by mouth daily with breakfast. 09/13/21   Mansouraty, Telford Nab., MD  levothyroxine (SYNTHROID) 25 MCG tablet Take 25 mcg by mouth daily. 11/17/21   [provider]  metoprolol succinate (TOPROL-XL) 50 MG 24 hr tablet Take 50 mg by mouth daily. 10/20/21    [provider]  sevelamer carbonate (RENVELA) 800 MG tablet Take 800 mg by mouth 3 (three) times daily. 03/09/22   [provider]  traMADol Veatrice Bourbon) 50 MG tablet  07/05/22   [provider]  Vitamin D, Ergocalciferol, (DRISDOL) 1.25 MG (50000 UNIT) CAPS capsule Take 50,000 Units by mouth once a week. Wednesday 10/25/21   [provider]      Allergies    Patient has no known allergies.    Review of Systems   Review of Systems  Unable to perform ROS: Acuity of condition  Constitutional:  Positive for fever.  Respiratory:  Positive for shortness of breath.   Gastrointestinal:  Positive for abdominal distention. Negative for vomiting.    Physical Exam Updated Vital Signs BP (!) 171/80   Pulse 89   Temp (!) 101 F (38.3 C) (Rectal)   Resp (!) 21   Ht 4\' 11"  (1.499 m)   Wt 66.6 kg   SpO2 96%   BMI 29.66 kg/m  Physical Exam Vitals and nursing note reviewed. Exam conducted with a chaperone present.  Constitutional:      General: She is not in acute distress.    Appearance: Normal appearance. She is well-developed.  HENT:     Head: Normocephalic and atraumatic.  Eyes:     Pupils: Pupils are equal, round, and reactive to light.  Cardiovascular:     Rate and Rhythm: Normal rate and regular rhythm.     Pulses: Normal pulses.  Heart sounds: Normal heart sounds.  Pulmonary:     Effort: Pulmonary effort is normal. No respiratory distress.     Breath sounds: Examination of the right-lower field reveals decreased breath sounds. Examination of the left-lower field reveals decreased breath sounds. Decreased breath sounds, wheezing and rales present.  Abdominal:     General: Bowel sounds are normal. There is no distension.     Palpations: Abdomen is soft.     Tenderness: There is no abdominal tenderness. There is no guarding or rebound.  Genitourinary:    Vagina: No vaginal discharge.  Musculoskeletal:        General: Normal range of motion.      Cervical back: Normal range of motion and neck supple.  Skin:    General: Skin is dry.     Capillary Refill: Capillary refill takes less than 2 seconds.     Findings: No erythema or rash.  Neurological:     General: No focal deficit present.     Mental Status: She is alert.     Deep Tendon Reflexes: Reflexes normal.  Psychiatric:        Mood and Affect: Mood normal.     ED Results / Procedures / Treatments   Labs (all labs ordered are listed, but only abnormal results are displayed) Labs Reviewed  RESP PANEL BY RT-PCR (RSV, FLU A&B, COVID)  [id number]  CULTURE, BLOOD (ROUTINE X 2)  CULTURE, BLOOD (ROUTINE X 2)  CBC WITH DIFFERENTIAL/PLATELET  BRAIN NATRIURETIC PEPTIDE  LACTIC ACID, PLASMA  LACTIC ACID, PLASMA  COMPREHENSIVE METABOLIC PANEL  PROTIME-INR  APTT  URINALYSIS, W/ REFLEX TO CULTURE (INFECTION SUSPECTED)  TROPONIN I (HIGH SENSITIVITY)    EKG EKG Interpretation  Date/Time:  Friday September 15 2022 23:32:18 EDT Ventricular Rate:  89 PR Interval:  143 QRS Duration: 130 QT Interval:  398 QTC Calculation: 485 R Axis:   89 Text Interpretation: Sinus rhythm Probable left atrial enlargement Right bundle branch block Confirmed by Mackinley Kiehn ([id number]) on 09/15/2022 11:36:43 PM  Radiology DG Chest Portable 1 View  Result Date: 09/15/2022 CLINICAL DATA:  Shortness of breath EXAM: PORTABLE CHEST 1 VIEW COMPARISON:  12/07/2021, 12/05/2021 FINDINGS: Cardiomegaly with vascular congestion and mild interstitial pulmonary edema. Small right greater than left pleural effusions. Hazy edema or infiltrate at the right base. IMPRESSION: Cardiomegaly with vascular congestion and mild interstitial pulmonary edema. Small right greater than left pleural effusions with hazy edema or infiltrate at the right base. Electronically Signed   By: Donavan Foil M.D.   On: 09/15/2022 23:56    Procedures Procedures  {Document cardiac monitor, telemetry assessment procedure when  appropriate:1}  Medications Ordered in ED Medications  ceFEPIme (MAXIPIME) 2 g in sodium chloride 0.9 % 100 mL IVPB (has no administration in time range)  metroNIDAZOLE (FLAGYL) IVPB 500 mg (has no administration in time range)  vancomycin (VANCOREADY) IVPB 1250 mg/250 mL (has no administration in time range)    ED Course/ Medical Decision Making/ A&P   {   Click here for ABCD2, HEART and other calculatorsREFRESH Note before signing :1}                          Medical Decision Making Amount and/or Complexity of Data Reviewed Labs: ordered. Radiology: ordered. ECG/medicine tests: ordered.  Risk Prescription drug management.   ***  {Document critical care time when appropriate:1} {Document review of labs and clinical decision tools ie heart score, Chads2Vasc2 etc:1}  {Document your independent  review of radiology images, and any outside records:1} {Document your discussion with family members, caretakers, and with consultants:1} {Document social determinants of health affecting pt's care:1} {Document your decision making why or why not admission, treatments were needed:1} Final Clinical Impression(s) / ED Diagnoses Final diagnoses:  Acute pulmonary edema ([place])  Pleural effusion  Sepsis, due to unspecified organism, unspecified whether acute organ dysfunction present [facility])    Rx / DC Orders ED Discharge Orders     None

## 2022-09-17 ENCOUNTER — Inpatient Hospital Stay (HOSPITAL_COMMUNITY): Payer: 59

## 2022-09-17 DIAGNOSIS — U071 COVID-19: Secondary | ICD-10-CM | POA: Diagnosis not present

## 2022-09-17 DIAGNOSIS — J810 Acute pulmonary edema: Secondary | ICD-10-CM

## 2022-09-17 LAB — CBC
HCT: 29.2 % — ABNORMAL LOW (ref 36.0–46.0)
Hemoglobin: 9.7 g/dL — ABNORMAL LOW (ref 12.0–15.0)
MCH: 34.8 pg — ABNORMAL HIGH (ref 26.0–34.0)
MCHC: 33.2 g/dL (ref 30.0–36.0)
MCV: 104.7 fL — ABNORMAL HIGH (ref 80.0–100.0)
Platelets: 55 10*3/uL — ABNORMAL LOW (ref 150–400)
RBC: 2.79 MIL/uL — ABNORMAL LOW (ref 3.87–5.11)
RDW: 14 % (ref 11.5–15.5)
WBC: 4.1 10*3/uL (ref 4.0–10.5)
nRBC: 0 % (ref 0.0–0.2)

## 2022-09-17 LAB — C-REACTIVE PROTEIN: CRP: 1.7 mg/dL — ABNORMAL HIGH (ref <1.0)

## 2022-09-17 LAB — HEPATITIS B SURFACE ANTIGEN: Hepatitis B Surface Ag: NONREACTIVE

## 2022-09-17 LAB — RENAL FUNCTION PANEL
Albumin: 3.1 g/dL — ABNORMAL LOW (ref 3.5–5.0)
Anion gap: 15 (ref 5–15)
BUN: 39 mg/dL — ABNORMAL HIGH (ref 8–23)
CO2: 20 mmol/L — ABNORMAL LOW (ref 22–32)
Calcium: 8.7 mg/dL — ABNORMAL LOW (ref 8.9–10.3)
Chloride: 99 mmol/L (ref 98–111)
Creatinine, Ser: 5.03 mg/dL — ABNORMAL HIGH (ref 0.44–1.00)
GFR, Estimated: 9 mL/min — ABNORMAL LOW (ref >60)
Glucose, Bld: 145 mg/dL — ABNORMAL HIGH (ref 70–99)
Phosphorus: 6.8 mg/dL — ABNORMAL HIGH (ref 2.5–4.6)
Potassium: 4.3 mmol/L (ref 3.5–5.1)
Sodium: 134 mmol/L — ABNORMAL LOW (ref 135–145)

## 2022-09-17 LAB — D-DIMER, QUANTITATIVE: D-Dimer, Quant: 0.87 ug/mL-FEU — ABNORMAL HIGH (ref 0.00–0.50)

## 2022-09-17 MED ORDER — SEVELAMER CARBONATE 800 MG PO TABS
800.0000 mg | ORAL_TABLET | Freq: Three times a day (TID) | ORAL | Status: DC
  Administered 2022-09-17 – 2022-09-21 (×12): 800 mg via ORAL
  Filled 2022-09-17 (×9): qty 1

## 2022-09-17 MED ORDER — CHLORHEXIDINE GLUCONATE CLOTH 2 % EX PADS
6.0000 | MEDICATED_PAD | Freq: Every day | CUTANEOUS | Status: DC
  Administered 2022-09-18 – 2022-09-20 (×2): 6 via TOPICAL

## 2022-09-17 NOTE — Progress Notes (Signed)
[facility]  PROGRESS NOTE  Stacey Chambers [id number] DOB: June 28, 1951 DOA: 09/15/2022 PCP: Horald Pollen, MD   Brief HPI:   72-year-old female with history of ESRD on hemodialysis MWF, diabetes mellitus type 2, hypothyroidism presented with fever, nonproductive cough.  Symptoms started after dialysis.  CT chest showed pleural effusion, edema and ascites with renal mass.   CT chest obtained at [place] [facility] 1.  Moderate pleural effusions with some associated atelectasis in the lower lobes. There are also areas of atelectasis in the middle lobe and lingula.  2.  Pericardial effusion. Minimal coronary calcification  suspected.  3.  Ascites. Possible cirrhosis.  4.  Small mass anterior lower left kidney, not obviously a simple cyst. Further evaluation needed to exclude malignancy.  5.  Gallstones.   Subjective   Patient seen and examined, denies any complaints.  No shortness of breath.  Not requiring oxygen.   Assessment/Plan:   Acute hypoxemic respiratory failure due to fluid overload in setting of ESRD -Patient given Lasix -Plan for hemodialysis on Monday  COVID-19 infection -Not requiring oxygen -Started on IV remdesivir, day #2 -Continue Solu-Medrol -CRP 1.7, D-dimer 0.87 -Chest x-ray showed cardiomegaly with vascular congestion  Renal lesion -Seen on CT chest done at [facility] -Renal ultrasound ordered but nephrology  Hypothyroidism -Continue Synthroid  ESRD on hemodialysis MWF -Nephrology following  Diabetes mellitus type 2 -Hemoglobin A1c 5.1 -Diet controlled  Hypertension -Continue amlodipine, metoprolol, losartan   Medications     acetaminophen  1,000 mg Oral Once   amLODipine  2.5 mg Oral Daily   Chlorhexidine Gluconate Cloth  6 each Topical Daily   darbepoetin (ARANESP) injection - NON-DIALYSIS  60 mcg Subcutaneous Q Sat-1800   [START ON 09/18/2022] doxercalciferol  3 mcg Intravenous Q M,W,F-HD   heparin  5,000 Units Subcutaneous  Q8H   levothyroxine  25 mcg Oral Q0600   losartan  50 mg Oral Daily   melatonin  3 mg Oral QHS   methylPREDNISolone (SOLU-MEDROL) injection  1 mg/kg Intravenous Q24H   Followed by   Derrill Memo ON 09/19/2022] predniSONE  50 mg Oral Daily   metoprolol succinate  50 mg Oral Daily   multivitamin  1 tablet Oral QHS   pantoprazole  40 mg Oral Daily   sevelamer carbonate  800 mg Oral TID WC     Data Reviewed:   CBG:  No results for input(s): "GLUCAP" in the last 168 hours.  SpO2: 99 %    Vitals:   09/17/22 0057 09/17/22 0500 09/17/22 0833 09/17/22 1207  BP: (!) 134/59  (!) 152/59 (!) 158/80  Pulse: 75  70 73  Resp: 16  19 19   Temp: 98 F (36.7 C)  97.8 F (36.6 C) 98.4 F (36.9 C)  TempSrc: Oral  Oral Oral  SpO2: 94%  99% 99%  Weight:  65.9 kg    Height:          Data Reviewed:  Basic Metabolic Panel: Recent Labs  Lab 09/15/22 2351 09/16/22 0515 09/17/22 0451  NA 129* 129* 134*  K 4.0 4.2 4.3  CL 92* 92* 99  CO2 25 24 20*  GLUCOSE 130* 97 145*  BUN 13 16 39*  CREATININE 3.21* 3.50* 5.03*  CALCIUM 8.2* 8.1* 8.7*  PHOS  --   --  6.8*    CBC: Recent Labs  Lab 09/15/22 2351 09/16/22 0515 09/17/22 0451  WBC 2.6* 2.4* 4.1  NEUTROABS 1.5*  --   --   HGB 9.2* 9.4* 9.7*  HCT  26.9* 27.1* 29.2*  MCV 103.5* 103.4* 104.7*  PLT 41* 40* 55*    LFT Recent Labs  Lab 09/15/22 2351 09/16/22 0515 09/17/22 0451  AST 28 27  --   ALT 16 14  --   ALKPHOS 100 86  --   BILITOT 0.6 1.0  --   PROT 7.3 7.1  --   ALBUMIN 3.2* 3.1* 3.1*     Antibiotics: Anti-infectives (From admission, onward)    Start     Dose/Rate Route Frequency Ordered Stop   09/17/22 1000  remdesivir 100 mg in sodium chloride 0.9 % 100 mL IVPB       See Hyperspace for full Linked Orders Report.   100 mg 200 mL/hr over 30 Minutes Intravenous Daily 09/16/22 0424 09/19/22 0959   09/16/22 0600  remdesivir 200 mg in sodium chloride 0.9% 250 mL IVPB       See Hyperspace for full Linked Orders  Report.   200 mg 580 mL/hr over 30 Minutes Intravenous Once 09/16/22 0424 09/16/22 0722   09/16/22 0000  vancomycin (VANCOREADY) IVPB 1250 mg/250 mL        1,250 mg 166.7 mL/hr over 90 Minutes Intravenous  Once 09/15/22 2346 09/16/22 0338   09/15/22 2345  ceFEPIme (MAXIPIME) 2 g in sodium chloride 0.9 % 100 mL IVPB        2 g 200 mL/hr over 30 Minutes Intravenous  Once 09/15/22 2340 09/16/22 0043   09/15/22 2345  metroNIDAZOLE (FLAGYL) IVPB 500 mg        500 mg 100 mL/hr over 60 Minutes Intravenous  Once 09/15/22 2340 09/16/22 0237   09/15/22 2345  vancomycin (VANCOCIN) IVPB 1000 mg/200 mL premix  Status:  Discontinued        1,000 mg 200 mL/hr over 60 Minutes Intravenous  Once 09/15/22 2340 09/15/22 2345        DVT prophylaxis: Heparin  Code Status: Full code  Family Communication:    CONSULTS nephrology   Objective    Physical Examination:   General-appears in no acute distress Heart-S1-S2, regular, no murmur auscultated Lungs-clear to auscultation bilaterally, no wheezing or crackles auscultated Abdomen-soft, nontender, no organomegaly Extremities-no edema in the lower extremities Neuro-alert, oriented x3, no focal deficit noted  Status is: Inpatient:             [name]   [facility] If 7PM-7AM, please contact night-coverage at [email or web address], Office  [phone]   09/17/2022, 2:45 PM  LOS: 1 day

## 2022-09-17 NOTE — Progress Notes (Signed)
Pt will not keep tele lead on, so mopnitor placed on standby

## 2022-09-17 NOTE — Progress Notes (Addendum)
[facility] Progress Note  Name: Stacey Chambers MRN: [id number] DOB: 11-18-1950  Chief Complaint:  Shortness of breath   Subjective:  Last HD 3/15 outpatient prior to presentation.  She had 950 mL UOP over 3/16 as well as 2 unmeasured urine voids.  She feels a little dizzy today.  She is so appreciative of everyone helping her.   Review of systems:  Shortness of breath and cough are better.  No chest pain  Denies n/v  -------------- Background on consult:  Stacey Chambers is a 72 y.o. female with a history of HTN, type 2 DM, and ESRD on HD who presented to the hospital with shortness of breath.  She was found to be covid positive.  Nephrology is consulted for dialysis needs and ESRD care.  The patient is still in the ER.  Reported as still making urine and was given lasix overnight.  No urine output is charted however purewick is near full.  She feels like her breathing has gotten better.  She was initially on oxygen however this has been taken off.  She confirms MWF HD at [facility] and has been going to treatments.  I used a video [name] interpreter at the bedside during our visit; appreciate ER staff locating this device for me.       Intake/Output Summary (Last 24 hours) at 09/17/2022 1100 Last data filed at 09/16/2022 1124 Gross per 24 hour  Intake --  Output 950 ml  Net -950 ml    Vitals:  Vitals:   09/16/22 1900 09/17/22 0057 09/17/22 0500 09/17/22 0833  BP: (!) 165/79 (!) 134/59  (!) 152/59  Pulse:  75  70  Resp: 15 16  19   Temp: 98.1 F (36.7 C) 98 F (36.7 C)  97.8 F (36.6 C)  TempSrc: Oral Oral  Oral  SpO2: 94% 94%  99%  Weight:   65.9 kg   Height:         Physical Exam:  General adult female in bed in no acute distress HEENT normocephalic atraumatic extraocular movements intact sclera anicteric Neck supple trachea midline Lungs Normal work of breathing at rest on room air  Heart S1S2 no rub Abdomen soft nontender  nondistended Extremities no edema appreciated Psych normal mood and affect Neuro - alert and conversant; provides history and follows commands Access LUE AVF bruit and thrill   Medications reviewed   Labs:     Latest Ref Rng & Units 09/17/2022    4:51 AM 09/16/2022    5:15 AM 09/15/2022   11:51 PM  BMP  Glucose 70 - 99 mg/dL 145  97  130   BUN 8 - 23 mg/dL 39  16  13   Creatinine 0.44 - 1.00 mg/dL 5.03  3.50  3.21   Sodium 135 - 145 mmol/L 134  129  129   Potassium 3.5 - 5.1 mmol/L 4.3  4.2  4.0   Chloride 98 - 111 mmol/L 99  92  92   CO2 22 - 32 mmol/L 20  24  25    Calcium 8.9 - 10.3 mg/dL 8.7  8.1  8.2    Outpatient HD orders:  [facility] MWF 3.5 hours  400 BF and DF auto 1.5 EDW 62.7 kg 2K/2 calcium AVF Last post weight 62.9 kg on 3/15 Meds: hectorol 3 mcg three times a week with HD.  Not currently ordered ESA but Mircera 50 mcg last given on 08/28/22.  She is not on heparin  Assessment/Plan:   # ESRD - HD per MWF schedule.  Next HD Monday (here vs outpatient) - She had Lasix 80 mg IV BID yesterday and got a dose this AM.  Can stop for now as she is dizzy  - Follow weights with covid - may not be accurate here - and anticipate may need to lower EDW   # HTN  - optimize volume status with HD - Continue current regimen    # Covid 19 PNA - Per primary team  - she is on steroids and remdesivir    # Anemia CKD  - Resumed ESA - aranesp at 60 mcg weekly on Saturdays for now   # Metabolic bone disease - continue hectorol here - resume renvela   # Hyponatremia - Setting of ESRD and impaired free water excretion  - Follow with HD   # Thrombocytopenia  - Per primary team; slightly improved  - not on heparin with HD   Disposition per primary team.    Claudia Desanctis, MD 09/17/2022 11:32 AM   # Left renal mass  - Discussed with primary team.  Noted on outside imaging from [place]  - Would obtain a renal ultrasound   Claudia Desanctis,  MD 11:36 AM 09/17/2022

## 2022-09-18 DIAGNOSIS — U071 COVID-19: Secondary | ICD-10-CM | POA: Diagnosis not present

## 2022-09-18 DIAGNOSIS — J810 Acute pulmonary edema: Secondary | ICD-10-CM | POA: Diagnosis not present

## 2022-09-18 LAB — COMPREHENSIVE METABOLIC PANEL
ALT: 20 U/L (ref 0–44)
AST: 32 U/L (ref 15–41)
Albumin: 3.1 g/dL — ABNORMAL LOW (ref 3.5–5.0)
Alkaline Phosphatase: 103 U/L (ref 38–126)
Anion gap: 16 — ABNORMAL HIGH (ref 5–15)
BUN: 62 mg/dL — ABNORMAL HIGH (ref 8–23)
CO2: 19 mmol/L — ABNORMAL LOW (ref 22–32)
Calcium: 8.5 mg/dL — ABNORMAL LOW (ref 8.9–10.3)
Chloride: 97 mmol/L — ABNORMAL LOW (ref 98–111)
Creatinine, Ser: 6.53 mg/dL — ABNORMAL HIGH (ref 0.44–1.00)
GFR, Estimated: 6 mL/min — ABNORMAL LOW (ref >60)
Glucose, Bld: 123 mg/dL — ABNORMAL HIGH (ref 70–99)
Potassium: 4.6 mmol/L (ref 3.5–5.1)
Sodium: 132 mmol/L — ABNORMAL LOW (ref 135–145)
Total Bilirubin: 0.5 mg/dL (ref 0.3–1.2)
Total Protein: 7.4 g/dL (ref 6.5–8.1)

## 2022-09-18 LAB — CBC
HCT: 29.1 % — ABNORMAL LOW (ref 36.0–46.0)
Hemoglobin: 10 g/dL — ABNORMAL LOW (ref 12.0–15.0)
MCH: 36 pg — ABNORMAL HIGH (ref 26.0–34.0)
MCHC: 34.4 g/dL (ref 30.0–36.0)
MCV: 104.7 fL — ABNORMAL HIGH (ref 80.0–100.0)
Platelets: 65 10*3/uL — ABNORMAL LOW (ref 150–400)
RBC: 2.78 MIL/uL — ABNORMAL LOW (ref 3.87–5.11)
RDW: 14.3 % (ref 11.5–15.5)
WBC: 5.9 10*3/uL (ref 4.0–10.5)
nRBC: 0 % (ref 0.0–0.2)

## 2022-09-18 MED ORDER — HEPARIN SODIUM (PORCINE) 1000 UNIT/ML DIALYSIS: 1000.0000 [IU] | INTRAMUSCULAR | Status: DC | PRN

## 2022-09-18 MED ORDER — ALTEPLASE 2 MG IJ SOLR: 2.0000 mg | Freq: Once | INTRAMUSCULAR | Status: DC | PRN

## 2022-09-18 MED ORDER — ANTICOAGULANT SODIUM CITRATE 4% (200MG/5ML) IV SOLN
5.0000 mL | Status: DC | PRN
  Filled 2022-09-18: qty 5

## 2022-09-18 MED ORDER — PENTAFLUOROPROP-TETRAFLUOROETH EX AERO: 1.0000 | INHALATION_SPRAY | CUTANEOUS | Status: DC | PRN

## 2022-09-18 MED ORDER — LIDOCAINE-PRILOCAINE 2.5-2.5 % EX CREA
1.0000 | TOPICAL_CREAM | CUTANEOUS | Status: DC | PRN
  Filled 2022-09-18: qty 5

## 2022-09-18 MED ORDER — LIDOCAINE HCL (PF) 1 % IJ SOLN
5.0000 mL | INTRAMUSCULAR | Status: DC | PRN
  Filled 2022-09-18: qty 5

## 2022-09-18 NOTE — Progress Notes (Addendum)
Received patient in bed,awake,alert  and oriented x 4 .Vitals were stable.[name] interpreter utilized on every communications to her.  Accessed used.: Left upper arm fistula that worked well: Two aneurism noted.  Medicine given: Hectorol 3 mcg.  Duration of treatment: 3.5 hour.  Fluid removed : Achieved UF goal of 2 liters.  Hemodialysis issue: None.  Hand off to the patient nurse at bedside.

## 2022-09-18 NOTE — Progress Notes (Addendum)
[facility] Progress Note   Subjective:   Patient seen and examined at bedside during dialysis with tele-interpreter.  Denies CP, SOB, abdominal pain and n/v/d.  Reports feeling much better.    Objective Vitals:   09/18/22 1016 09/18/22 1032 09/18/22 1047 09/18/22 1102  BP: 126/67 (!) 155/64 (!) 156/59 (!) 166/57  Pulse: 62 61 62 60  Resp: 15 15 19 15   Temp:      TempSrc:      SpO2: 94% 97% 97% 97%  Weight:      Height:       Physical Exam General:WDWN female in NAD Heart:RRR Lungs:CTAB anterolaterally  Abdomen:soft, NTND Extremities:no LE edema Dialysis Access: LU AVF in use   Filed Weights   09/15/22 2342 09/17/22 0500 09/18/22 0811  Weight: 66.6 kg 65.9 kg 65.4 kg   No intake or output data in the 24 hours ending 09/18/22 1124  Additional Objective Labs: Basic Metabolic Panel: Recent Labs  Lab 09/16/22 0515 09/17/22 0451 09/18/22 0325  NA 129* 134* 132*  K 4.2 4.3 4.6  CL 92* 99 97*  CO2 24 20* 19*  GLUCOSE 97 145* 123*  BUN 16 39* 62*  CREATININE 3.50* 5.03* 6.53*  CALCIUM 8.1* 8.7* 8.5*  PHOS  --  6.8*  --    Liver Function Tests: Recent Labs  Lab 09/15/22 2351 09/16/22 0515 09/17/22 0451 09/18/22 0325  AST 28 27  --  32  ALT 16 14  --  20  ALKPHOS 100 86  --  103  BILITOT 0.6 1.0  --  0.5  PROT 7.3 7.1  --  7.4  ALBUMIN 3.2* 3.1* 3.1* 3.1*   CBC: Recent Labs  Lab 09/15/22 2351 09/16/22 0515 09/17/22 0451 09/18/22 0857  WBC 2.6* 2.4* 4.1 5.9  NEUTROABS 1.5*  --   --   --   HGB 9.2* 9.4* 9.7* 10.0*  HCT 26.9* 27.1* 29.2* 29.1*  MCV 103.5* 103.4* 104.7* 104.7*  PLT 41* 40* 55* 65*   Blood Culture    Component Value Date/Time   SDES BLOOD SITE NOT SPECIFIED 09/16/2022 0010   SPECREQUEST  09/16/2022 0010    BOTTLES DRAWN AEROBIC AND ANAEROBIC Blood Culture results may not be optimal due to an excessive volume of blood received in culture bottles   CULT  09/16/2022 0010    NO GROWTH 2 DAYS Performed at [facility], [address], [place], [place] [address]    REPTSTATUS PENDING 09/16/2022 0010   Iron Studies:  Recent Labs    09/16/22 0515  IRON 47  TIBC 239*  FERRITIN 378*   Lab Results  Component Value Date   INR 1.4 (H) 09/15/2022   INR 1.3 (H) 12/10/2021   INR 1.4 (H) 12/08/2021   Studies/Results: US RENAL  Result Date: 09/17/2022 CLINICAL DATA:  Renal mass EXAM: RENAL / URINARY TRACT ULTRASOUND COMPLETE COMPARISON:  MRI 06/29/2021, CT 07/10/2022. FINDINGS: Right Kidney: Renal measurements: 6.1 x 3.0 x 3.6 cm = volume: 35 mL. Echogenic renal cortex. No hydronephrosis. Appears mildly atrophic in comparison to the left. No definite renal mass identified sonographically. Left Kidney: Renal measurements: 8.6 x 4.2 x 4.1 cm = volume: 77 mL. Echogenic renal cortex. No hydronephrosis. No definite renal mass identified sonographically. Bladder: Decompressed. Other: Abdominal ascites noted.  Cirrhotic liver morphology. IMPRESSION: No hydronephrosis. Echogenic kidneys, as can be seen in medical renal disease. No definite renal mass identified sonographically. If this is a clinical concern, would recommend a contrast enhanced  CT or MRI. Cirrhotic liver morphology with abdominal ascites noted. Electronically Signed   By: Maurine Simmering M.D.   On: 09/17/2022 18:28   ECHOCARDIOGRAM COMPLETE  Result Date: 09/16/2022    ECHOCARDIOGRAM REPORT   Patient Name:   Stacey Chambers Date of Exam: 09/16/2022 Medical Rec #:  [id number]             Height:       59.0 in Accession #:    [id number]            Weight:       146.8 lb Date of Birth:  May 03, 1951            BSA:          1.617 m Patient Age:    72 years              BP:           166/95 mmHg Patient Gender: F                     HR:           81 bpm. Exam Location:  Inpatient Procedure: 2D Echo, Cardiac Doppler and Color Doppler Indications:    CHF-Acute Systolic [id number]  History:        Patient has prior history of Echocardiogram examinations, most                  recent 12/02/2021. ESRD, Signs/Symptoms:Shortness of Breath; Risk                 Factors:Hypertension, Dyslipidemia and Non-Smoker.  Sonographer:    Wilkie Aye RVT RCS Referring Phys: [id number] [place]  1. Left ventricular ejection fraction, by estimation, is 60 to 65%. The left ventricle has normal function. The left ventricle has no regional wall motion abnormalities. There is mild left ventricular hypertrophy. Left ventricular diastolic parameters are indeterminate.  2. Right ventricular systolic function is normal. The right ventricular size is normal. There is mildly elevated pulmonary artery systolic pressure. The estimated right ventricular systolic pressure is 99991111 mmHg.  3. Left atrial size was moderately dilated.  4. A small pericardial effusion is present. The pericardial effusion is circumferential. There is no evidence of cardiac tamponade.  5. The mitral valve is normal in structure. Mild mitral valve regurgitation. No evidence of mitral stenosis.  6. Tricuspid valve regurgitation is moderate.  7. The aortic valve is calcified. There is mild calcification of the aortic valve. There is mild thickening of the aortic valve. Aortic valve regurgitation is not visualized. Mild aortic valve stenosis. Aortic valve area, by VTI measures 2.07 cm. Aortic valve mean gradient measures 10.0 mmHg. Aortic valve Vmax measures 2.16 m/s.  8. The inferior vena cava is normal in size with <50% respiratory variability, suggesting right atrial pressure of 8 mmHg. Comparison(s): No significant change from prior study. Prior images reviewed side by side. FINDINGS  Left Ventricle: Left ventricular ejection fraction, by estimation, is 60 to 65%. The left ventricle has normal function. The left ventricle has no regional wall motion abnormalities. The left ventricular internal cavity size was normal in size. There is  mild left ventricular hypertrophy. Left ventricular diastolic parameters are  indeterminate. Right Ventricle: The right ventricular size is normal. No increase in right ventricular wall thickness. Right ventricular systolic function is normal. There is mildly elevated pulmonary artery systolic pressure. The tricuspid regurgitant velocity is 2.91  m/s, and with an assumed  right atrial pressure of 8 mmHg, the estimated right ventricular systolic pressure is [id number] mmHg. Left Atrium: Left atrial size was moderately dilated. Right Atrium: Right atrial size was normal in size. Pericardium: A small pericardial effusion is present. The pericardial effusion is circumferential. There is no evidence of cardiac tamponade. Mitral Valve: The mitral valve is normal in structure. Mild mitral valve regurgitation. No evidence of mitral valve stenosis. Tricuspid Valve: The tricuspid valve is normal in structure. Tricuspid valve regurgitation is moderate . No evidence of tricuspid stenosis. Aortic Valve: The aortic valve is calcified. There is mild calcification of the aortic valve. There is mild thickening of the aortic valve. Aortic valve regurgitation is not visualized. Mild aortic stenosis is present. Aortic valve mean gradient measures  10.0 mmHg. Aortic valve peak gradient measures 18.6 mmHg. Aortic valve area, by VTI measures 2.07 cm. Pulmonic Valve: The pulmonic valve was normal in structure. Pulmonic valve regurgitation is not visualized. No evidence of pulmonic stenosis. Aorta: The aortic root is normal in size and structure. Venous: The inferior vena cava is normal in size with less than 50% respiratory variability, suggesting right atrial pressure of 8 mmHg. IAS/Shunts: No atrial level shunt detected by color flow Doppler.  LEFT VENTRICLE PLAX 2D LVIDd:         4.70 cm   Diastology LVIDs:         2.20 cm   LV e' medial:    6.90 cm/s LV PW:         1.20 cm   LV E/e' medial:  17.2 LV IVS:        0.80 cm   LV e' lateral:   7.80 cm/s LVOT diam:     1.80 cm   LV E/e' lateral: 15.3 LV SV:         86 LV SV  Index:   53 LVOT Area:     2.54 cm  RIGHT VENTRICLE             IVC RV Basal diam:  3.20 cm     IVC diam: 1.80 cm RV Mid diam:    2.50 cm RV S prime:     14.80 cm/s TAPSE (M-mode): 2.6 cm LEFT ATRIUM             Index        RIGHT ATRIUM           Index LA diam:        3.80 cm 2.35 cm/m   RA Area:     15.80 cm LA Vol (A2C):   85.8 ml 53.05 ml/m  RA Volume:   40.70 ml  25.16 ml/m LA Vol (A4C):   68.4 ml 42.32 ml/m LA Biplane Vol: 82.7 ml 51.13 ml/m  AORTIC VALVE                     PULMONIC VALVE AV Area (Vmax):    1.89 cm      PV Vmax:       1.45 m/s AV Area (Vmean):   1.98 cm      PV Peak grad:  8.4 mmHg AV Area (VTI):     2.07 cm AV Vmax:           215.50 cm/s AV Vmean:          147.500 cm/s AV VTI:            0.414 m AV Peak Grad:      18.6 mmHg AV  Mean Grad:      10.0 mmHg LVOT Vmax:         160.00 cm/s LVOT Vmean:        115.000 cm/s LVOT VTI:          0.337 m LVOT/AV VTI ratio: 0.81  AORTA Ao Root diam: 2.70 cm Ao Asc diam:  3.20 cm Ao Arch diam: 2.2 cm MITRAL VALVE                TRICUSPID VALVE MV Area (PHT): 4.80 cm     TR Peak grad:   33.9 mmHg MV Decel Time: 158 msec     TR Vmax:        291.00 cm/s MV E velocity: 119.00 cm/s MV A velocity: 90.80 cm/s   SHUNTS MV E/A ratio:  1.31         Systemic VTI:  0.34 m                             Systemic Diam: 1.80 cm Candee Furbish MD Electronically signed by Candee Furbish MD Signature Date/Time: 09/16/2022/4:41:10 PM    Final     Medications:  anticoagulant sodium citrate     remdesivir 100 mg in sodium chloride 0.9 % 100 mL IVPB 100 mg (09/17/22 0830)    acetaminophen  1,000 mg Oral Once   amLODipine  2.5 mg Oral Daily   Chlorhexidine Gluconate Cloth  6 each Topical Daily   Chlorhexidine Gluconate Cloth  6 each Topical Q0600   darbepoetin (ARANESP) injection - NON-DIALYSIS  60 mcg Subcutaneous Q Sat-1800   doxercalciferol  3 mcg Intravenous Q M,W,F-HD   heparin  5,000 Units Subcutaneous Q8H   levothyroxine  25 mcg Oral Q0600   losartan  50 mg  Oral Daily   melatonin  3 mg Oral QHS   metoprolol succinate  50 mg Oral Daily   multivitamin  1 tablet Oral QHS   pantoprazole  40 mg Oral Daily   [START ON 09/19/2022] predniSONE  50 mg Oral Daily   sevelamer carbonate  800 mg Oral TID WC    Dialysis Orders: [facility] MWF 3.5 hours  400 BF and DF auto 1.5 EDW 62.7 kg 2K/2 calcium AVF Last post weight 62.9 kg on 3/15 Meds: hectorol 3 mcg three times a week with HD.  Not currently ordered ESA but Mircera 50 mcg last given on 08/28/22.  She is not on heparin  Assessment/Plan: 1. COVID PNA - on steroids/remdesivir 2. Hyponatremia - improved with HD.  3. Thrombocytopenia - improving.  Not on heparin.  4. Acute hypoxemic respiratory failure - improved.  Not requiring O2.  5. ESRD -On HD MWF.  HD today per regular schedule.  6. Anemia of CKD- Hgb 10. Tsat 20%. Resume ESA, Aranesp 28mcg qwk. 7. Secondary hyperparathyroidism - Ca at goal.  Phos elevated.  Continue binders and VDRA.  8. HTN/volume - BP variable. Continue home meds.  Does not appear grossly volume overloaded. Continue UF as tolerated.  Getting standing weight when able.  9. Nutrition - Renal diet w/fluid restrictions.  10. L renal mass - noted on outside imaging. Renal US with no definite renal mass identified.  [name] Mow, PA-C [facility] 09/18/2022,11:24 AM  LOS: 2 days

## 2022-09-18 NOTE — TOC Progression Note (Signed)
Transition of Care [facility]) - Progression Note    Patient Details  Name: Yamely Westcott MRN: [id number] Date of Birth: [birth year]  [facility]) CM/SW Contact  Levonne Lapping, RN Phone Number: 09/18/2022, 3:33 PM  Clinical Narrative:     CM followed up with Medicaid transport regarding options for this patient to get to and from HD after DC due to [place] .   Following note was sent via secure chat   I called Medicaid Transport. Medicaid's policy is 2 negative COVID tests before they will arrange any other transport. I reached out to [employer] ( contracted thru [place] for this patient) and spoke with Supervisor, Tamika. She stated that they will accept 1 negative covid test . The results needs to be e mailed to [email or web address] Her direct number is [phone]. Once they have the 1 Negative result they will offer transportation . Can we go ahead and get a test done to see if she's negative ?   Team and CSW aware TOC will continue to follow patient for any additional discharge needs            Expected Discharge Plan and Services                                               Social Determinants of Health (SDOH) Interventions SDOH Screenings   Depression (PHQ2-9): Low Risk  (07/12/2022)  Tobacco Use: Low Risk  (09/15/2022)    Readmission Risk Interventions     No data to display

## 2022-09-18 NOTE — Progress Notes (Signed)
Contacted by team regarding pt's possible d/c today. Pt is covid positive as of 3/15. Contacted [facility] and spoke to clinic social worker who confirmed with clinic manager that pt can resume care on Wednesday and clinic has an appropriate chair available due to covid diagnosis. However, clinic social worker advised navigator that pt rides [employer] transportation which will not allow pt to ride with covid diagnosis. This info was provided to treatment team. Spoke to pt via phone with [place] interpreter. Pt does not have any friends or family that can assist with transportation to HD and pt does not have funds to pay for transportation. This info was provided to treatment team as well and contacted [facility] staff for possible options/assistance. Per clinic manager, pt's last treatment in isolation will be 3/29. Will assist as needed.   Melven Sartorius Renal Navigator [phone]

## 2022-09-18 NOTE — Progress Notes (Signed)
Heart Failure Navigator Progress Note  Assessed for Heart & Vascular TOC clinic readiness.  Patient does not meet criteria due to ESRD on hemodialysis.   Navigator will sign off at this time.    Shogo Larkey, BSN, RN Heart Failure Nurse Navigator Secure Chat Only   

## 2022-09-18 NOTE — Progress Notes (Signed)
Pt is awake and in the bed she said she didn't sleep well last night but everytime  I checked on her she was asleep, she shows no signs of distress at this time

## 2022-09-18 NOTE — Plan of Care (Signed)
  Problem: Education: Goal: Ability to demonstrate management of disease process will improve Outcome: Progressing Goal: Ability to verbalize understanding of medication therapies will improve Outcome: Progressing Goal: Individualized Educational Video(s) Outcome: Progressing   Problem: Activity: Goal: Capacity to carry out activities will improve Outcome: Progressing   Problem: Cardiac: Goal: Ability to achieve and maintain adequate cardiopulmonary perfusion will improve Outcome: Progressing   Problem: Education: Goal: Knowledge of risk factors and measures for prevention of condition will improve Outcome: Progressing   Problem: Coping: Goal: Psychosocial and spiritual needs will be supported Outcome: Progressing   Problem: Respiratory: Goal: Will maintain a patent airway Outcome: Progressing Goal: Complications related to the disease process, condition or treatment will be avoided or minimized Outcome: Progressing   Problem: Education: Goal: Knowledge of General Education information will improve Description: Including pain rating scale, medication(s)/side effects and non-pharmacologic comfort measures Outcome: Progressing   Problem: Health Behavior/Discharge Planning: Goal: Ability to manage health-related needs will improve Outcome: Progressing   Problem: Clinical Measurements: Goal: Ability to maintain clinical measurements within normal limits will improve Outcome: Progressing Goal: Will remain free from infection Outcome: Progressing Goal: Diagnostic test results will improve Outcome: Progressing Goal: Respiratory complications will improve Outcome: Progressing Goal: Cardiovascular complication will be avoided Outcome: Progressing   Problem: Activity: Goal: Risk for activity intolerance will decrease Outcome: Progressing   Problem: Nutrition: Goal: Adequate nutrition will be maintained Outcome: Progressing   Problem: Coping: Goal: Level of anxiety  will decrease Outcome: Progressing   Problem: Elimination: Goal: Will not experience complications related to bowel motility Outcome: Progressing Goal: Will not experience complications related to urinary retention Outcome: Progressing   Problem: Pain Managment: Goal: General experience of comfort will improve Outcome: Progressing   Problem: Safety: Goal: Ability to remain free from injury will improve Outcome: Progressing   Problem: Skin Integrity: Goal: Risk for impaired skin integrity will decrease Outcome: Progressing   

## 2022-09-18 NOTE — Progress Notes (Signed)
[facility]  PROGRESS NOTE  Stacey Chambers [id number] DOB: 1951/04/15 DOA: 09/15/2022 PCP: Horald Pollen, MD   Brief HPI:   72-year-old female with history of ESRD on hemodialysis MWF, diabetes mellitus type 2, hypothyroidism presented with fever, nonproductive cough.  Symptoms started after dialysis.  CT chest showed pleural effusion, edema and ascites with renal mass.   CT chest obtained at [facility].  Moderate pleural effusions with some associated atelectasis in the lower lobes. There are also areas of atelectasis in the middle lobe and lingula.  2.  Pericardial effusion. Minimal coronary calcification  suspected.  3.  Ascites. Possible cirrhosis.  4.  Small mass anterior lower left kidney, not obviously a simple cyst. Further evaluation needed to exclude malignancy.  5.  Gallstones.   Subjective   Patient seen and examined, denies shortness of breath.   Assessment/Plan:   Acute hypoxemic respiratory failure due to fluid overload in setting of ESRD -Patient given Lasix -Plan for hemodialysis on Monday  COVID-19 infection -Not requiring oxygen -Started on IV remdesivir, day #3 -Completed 3 days of treatment with remdesivir -Continue Solu-Medrol; start p.o. prednisone from tomorrow -CRP 1.7, D-dimer 0.87 -Chest x-ray showed cardiomegaly with vascular congestion  Renal lesion -Seen on CT chest done at [facility] -Renal ultrasound did not show any mass  Hypothyroidism -Continue Synthroid  ESRD on hemodialysis MWF -Nephrology following  Diabetes mellitus type 2 -Hemoglobin A1c 5.1 -Diet controlled  Hypertension -Continue amlodipine, metoprolol, losartan   Medications     acetaminophen  1,000 mg Oral Once   amLODipine  2.5 mg Oral Daily   Chlorhexidine Gluconate Cloth  6 each Topical Daily   Chlorhexidine Gluconate Cloth  6 each Topical Q0600   darbepoetin (ARANESP) injection - NON-DIALYSIS  60 mcg Subcutaneous Q Sat-1800    doxercalciferol  3 mcg Intravenous Q M,W,F-HD   heparin  5,000 Units Subcutaneous Q8H   levothyroxine  25 mcg Oral Q0600   losartan  50 mg Oral Daily   melatonin  3 mg Oral QHS   metoprolol succinate  50 mg Oral Daily   multivitamin  1 tablet Oral QHS   pantoprazole  40 mg Oral Daily   [START ON 09/19/2022] predniSONE  50 mg Oral Daily   sevelamer carbonate  800 mg Oral TID WC     Data Reviewed:   CBG:  No results for input(s): "GLUCAP" in the last 168 hours.  SpO2: 99 %    Vitals:   09/18/22 1146 09/18/22 1201 09/18/22 1220 09/18/22 1300  BP: (!) 162/61 (!) 148/60 (!) 152/67 (!) 146/70  Pulse: (!) 58 60 66 76  Resp: 15 14 16 18   Temp:   98 F (36.7 C) 98.1 F (36.7 C)  TempSrc:    Oral  SpO2: 95% 98% 96% 99%  Weight:   63.5 kg   Height:          Data Reviewed:  Basic Metabolic Panel: Recent Labs  Lab 09/15/22 2351 09/16/22 0515 09/17/22 0451 09/18/22 0325  NA 129* 129* 134* 132*  K 4.0 4.2 4.3 4.6  CL 92* 92* 99 97*  CO2 25 24 20* 19*  GLUCOSE 130* 97 145* 123*  BUN 13 16 39* 62*  CREATININE 3.21* 3.50* 5.03* 6.53*  CALCIUM 8.2* 8.1* 8.7* 8.5*  PHOS  --   --  6.8*  --     CBC: Recent Labs  Lab 09/15/22 2351 09/16/22 0515 09/17/22 0451 09/18/22 0857  WBC 2.6* 2.4* 4.1 5.9  NEUTROABS  1.5*  --   --   --   HGB 9.2* 9.4* 9.7* 10.0*  HCT 26.9* 27.1* 29.2* 29.1*  MCV 103.5* 103.4* 104.7* 104.7*  PLT 41* 40* 55* 65*    LFT Recent Labs  Lab 09/15/22 2351 09/16/22 0515 09/17/22 0451 09/18/22 0325  AST 28 27  --  32  ALT 16 14  --  20  ALKPHOS 100 86  --  103  BILITOT 0.6 1.0  --  0.5  PROT 7.3 7.1  --  7.4  ALBUMIN 3.2* 3.1* 3.1* 3.1*     Antibiotics: Anti-infectives (From admission, onward)    Start     Dose/Rate Route Frequency Ordered Stop   09/17/22 1000  remdesivir 100 mg in sodium chloride 0.9 % 100 mL IVPB       See Hyperspace for full Linked Orders Report.   100 mg 200 mL/hr over 30 Minutes Intravenous Daily 09/16/22 0424  09/18/22 1353   09/16/22 0600  remdesivir 200 mg in sodium chloride 0.9% 250 mL IVPB       See Hyperspace for full Linked Orders Report.   200 mg 580 mL/hr over 30 Minutes Intravenous Once 09/16/22 0424 09/16/22 0722   09/16/22 0000  vancomycin (VANCOREADY) IVPB 1250 mg/250 mL        1,250 mg 166.7 mL/hr over 90 Minutes Intravenous  Once 09/15/22 2346 09/16/22 0338   09/15/22 2345  ceFEPIme (MAXIPIME) 2 g in sodium chloride 0.9 % 100 mL IVPB        2 g 200 mL/hr over 30 Minutes Intravenous  Once 09/15/22 2340 09/16/22 0043   09/15/22 2345  metroNIDAZOLE (FLAGYL) IVPB 500 mg        500 mg 100 mL/hr over 60 Minutes Intravenous  Once 09/15/22 2340 09/16/22 0237   09/15/22 2345  vancomycin (VANCOCIN) IVPB 1000 mg/200 mL premix  Status:  Discontinued        1,000 mg 200 mL/hr over 60 Minutes Intravenous  Once 09/15/22 2340 09/15/22 2345        DVT prophylaxis: Heparin  Code Status: Full code  Family Communication:    CONSULTS nephrology   Objective    Physical Examination:  General-appears in no acute distress Heart-S1-S2, regular, no murmur auscultated Lungs-clear to auscultation bilaterally, no wheezing or crackles auscultated Abdomen-soft, nontender, no organomegaly Extremities-no edema in the lower extremities Neuro-alert, oriented x3, no focal deficit noted   Status is: Inpatient:             [name]   [facility] If 7PM-7AM, please contact night-coverage at [email or web address], Office  [phone]   09/18/2022, 3:42 PM  LOS: 2 days

## 2022-09-19 DIAGNOSIS — J90 Pleural effusion, not elsewhere classified: Secondary | ICD-10-CM | POA: Diagnosis not present

## 2022-09-19 DIAGNOSIS — J810 Acute pulmonary edema: Secondary | ICD-10-CM | POA: Diagnosis not present

## 2022-09-19 DIAGNOSIS — R7989 Other specified abnormal findings of blood chemistry: Secondary | ICD-10-CM | POA: Diagnosis not present

## 2022-09-19 DIAGNOSIS — U071 COVID-19: Secondary | ICD-10-CM | POA: Diagnosis not present

## 2022-09-19 LAB — HEPATITIS B SURFACE ANTIBODY, QUANTITATIVE: Hep B S AB Quant (Post): >1000 m[IU]/mL (ref >9.9)

## 2022-09-19 LAB — GLUCOSE, CAPILLARY: Glucose-Capillary: 152 mg/dL — ABNORMAL HIGH (ref 70–99)

## 2022-09-19 MED ORDER — NEPRO/CARBSTEADY PO LIQD
237.0000 mL | ORAL | Status: DC
  Administered 2022-09-19 – 2022-09-21 (×3): 237 mL via ORAL

## 2022-09-19 MED ORDER — CHLORHEXIDINE GLUCONATE CLOTH 2 % EX PADS
6.0000 | MEDICATED_PAD | Freq: Every day | CUTANEOUS | Status: DC
  Administered 2022-09-20: 6 via TOPICAL

## 2022-09-19 NOTE — Progress Notes (Signed)
[facility] Progress Note   Subjective:   Patient seen and examined at bedside with tele-interpreter.  Discussed need for transportation arrangement to dialysis prior to discharge due to COVID status and needing a negative test to ride her previous transportation.  Expressed understanding.  Reports ongoing drenching night sweats the last few days.  Denies CP, SOB, abdominal pain and n/v/d.  States she tolerated dialysis well yesterday.    Objective Vitals:   09/18/22 1220 09/18/22 1300 09/18/22 1703 09/19/22 0935  BP: (!) 152/67 (!) 146/70 (!) 157/72 (!) 163/62  Pulse: 66 76    Resp: 16 18 20    Temp: 98 F (36.7 C) 98.1 F (36.7 C)    TempSrc:  Oral    SpO2: 96% 99%    Weight: 63.5 kg     Height:       Physical Exam General:WDWN female in NAD Heart:RRR, no mrg Lungs:fine crackles in b/l bases, nml WOB on RA Abdomen:soft, NTND Extremities:no LE edema Dialysis Access: LU AVF +b/t   Filed Weights   09/17/22 0500 09/18/22 0811 09/18/22 1220  Weight: 65.9 kg 65.4 kg 63.5 kg    Intake/Output Summary (Last 24 hours) at 09/19/2022 1130 Last data filed at 09/18/2022 1220 Gross per 24 hour  Intake --  Output 2000 ml  Net -2000 ml    Additional Objective Labs: Basic Metabolic Panel: Recent Labs  Lab 09/16/22 0515 09/17/22 0451 09/18/22 0325  NA 129* 134* 132*  K 4.2 4.3 4.6  CL 92* 99 97*  CO2 24 20* 19*  GLUCOSE 97 145* 123*  BUN 16 39* 62*  CREATININE 3.50* 5.03* 6.53*  CALCIUM 8.1* 8.7* 8.5*  PHOS  --  6.8*  --    Liver Function Tests: Recent Labs  Lab 09/15/22 2351 09/16/22 0515 09/17/22 0451 09/18/22 0325  AST 28 27  --  32  ALT 16 14  --  20  ALKPHOS 100 86  --  103  BILITOT 0.6 1.0  --  0.5  PROT 7.3 7.1  --  7.4  ALBUMIN 3.2* 3.1* 3.1* 3.1*   CBC: Recent Labs  Lab 09/15/22 2351 09/16/22 0515 09/17/22 0451 09/18/22 0857  WBC 2.6* 2.4* 4.1 5.9  NEUTROABS 1.5*  --   --   --   HGB 9.2* 9.4* 9.7* 10.0*  HCT 26.9* 27.1* 29.2*  29.1*  MCV 103.5* 103.4* 104.7* 104.7*  PLT 41* 40* 55* 65*    Studies/Results: US RENAL  Result Date: 09/17/2022 CLINICAL DATA:  Renal mass EXAM: RENAL / URINARY TRACT ULTRASOUND COMPLETE COMPARISON:  MRI 06/29/2021, CT 07/10/2022. FINDINGS: Right Kidney: Renal measurements: 6.1 x 3.0 x 3.6 cm = volume: 35 mL. Echogenic renal cortex. No hydronephrosis. Appears mildly atrophic in comparison to the left. No definite renal mass identified sonographically. Left Kidney: Renal measurements: 8.6 x 4.2 x 4.1 cm = volume: 77 mL. Echogenic renal cortex. No hydronephrosis. No definite renal mass identified sonographically. Bladder: Decompressed. Other: Abdominal ascites noted.  Cirrhotic liver morphology. IMPRESSION: No hydronephrosis. Echogenic kidneys, as can be seen in medical renal disease. No definite renal mass identified sonographically. If this is a clinical concern, would recommend a contrast enhanced CT or MRI. Cirrhotic liver morphology with abdominal ascites noted. Electronically Signed   By: Maurine Simmering M.D.   On: 09/17/2022 18:28    Medications:  anticoagulant sodium citrate      acetaminophen  1,000 mg Oral Once   amLODipine  2.5 mg Oral Daily   Chlorhexidine Gluconate Cloth  6 each Topical Daily   Chlorhexidine Gluconate Cloth  6 each Topical Q0600   darbepoetin (ARANESP) injection - NON-DIALYSIS  60 mcg Subcutaneous Q Sat-1800   doxercalciferol  3 mcg Intravenous Q M,W,F-HD   heparin  5,000 Units Subcutaneous Q8H   levothyroxine  25 mcg Oral Q0600   losartan  50 mg Oral Daily   melatonin  3 mg Oral QHS   metoprolol succinate  50 mg Oral Daily   multivitamin  1 tablet Oral QHS   pantoprazole  40 mg Oral Daily   predniSONE  50 mg Oral Daily   sevelamer carbonate  800 mg Oral TID WC    Dialysis Orders: [facility] MWF 3.5 hours  400 BF and DF auto 1.5 EDW 62.7 kg 2K/2 calcium AVF Last post weight 62.9 kg on 3/15 Meds: hectorol 3 mcg three times a week  with HD.  Not currently ordered ESA but Mircera 50 mcg last given on 08/28/22.  She is not on heparin   Assessment/Plan: 1. COVID PNA - on steroids/remdesivir.  Needs negative COVID test to ride transportation to dialysis.  2. Hyponatremia - improved with HD.  3. Thrombocytopenia - improving.  Not on heparin.  4. Acute hypoxemic respiratory failure - improved.  Not requiring O2.  5. ESRD -On HD MWF.  HD today per regular schedule.  6. Anemia of CKD- Hgb 10. Tsat 20%. Resume ESA, Aranesp 29mcg qwk. 7. Secondary hyperparathyroidism - Ca at goal.  Phos elevated.  Continue binders and VDRA.  8. HTN/volume - BP variable. Continue home meds.  Does not appear grossly volume overloaded. Continue UF as tolerated.  Getting standing weight when able.  9. Nutrition - Renal diet w/fluid restrictions.  10. L renal mass - noted on outside imaging. Renal US with no definite renal mass identified.  [name] Mow, PA-C [facility] 09/19/2022,11:30 AM  LOS: 3 days

## 2022-09-19 NOTE — Progress Notes (Signed)
Initial Nutrition Assessment  DOCUMENTATION CODES:   Not applicable  INTERVENTION:   Renal Multivitamin w/ minerals daily Meal ordering with assist Nepro Shake po Daily, each supplement provides 425 kcal and 19 grams protein  NUTRITION DIAGNOSIS:   Increased nutrient needs related to acute illness (COVID) as evidenced by estimated needs.  GOAL:   Patient will meet greater than or equal to 90% of their needs  MONITOR:   PO intake, Labs, I & O's, Supplement acceptance  REASON FOR ASSESSMENT:   Malnutrition Screening Tool    ASSESSMENT:   72 y.o. female presented to the ED with fever and cough. PMH includes T2DM, CHF, HTN, ESRD on HD, and cirrhosis. Pt admitted with fluid overload and COVID+.   RD working remotely at time of assessment. Pt is spanish speaking, RD unable to interpret with pt over the phone. Discussed with RN, RN reports that pt PO intake has been adequate. Still working on discharge due to pt being COVID+.  No meal intakes have been recorded within EMR during admission.  Medications reviewed and include: Hectorol, Melatonin, rena-vit, Protonix, Prednisone, Renvela Labs reviewed (from 09/18/22): Sodium 132, Potassium 4.6, Phosourus 6.8,  Hgb A1c 4.7% (07/12/22)  HD on 3/18 EDW: 62.7 kg Net UF: 2000 mL  NUTRITION - FOCUSED PHYSICAL EXAM:  Deferred to follow-up.   Diet Order:   Diet Order             Diet regular Room service appropriate? Yes; Fluid consistency: Thin  Diet effective now                   EDUCATION NEEDS:   No education needs have been identified at this time  Skin:  Skin Assessment: Reviewed RN Assessment  Last BM:  3/16  Height:  Ht Readings from Last 1 Encounters:  09/15/22 4\' 11"  (1.499 m)   Weight:  Wt Readings from Last 1 Encounters:  09/18/22 63.5 kg   BMI:  Body mass index is 28.27 kg/m.  Estimated Nutritional Needs:  Kcal:  1800-2000 Protein:  90-110 grams Fluid:  1L+ UOP   Hermina Barters RD,  LDN Clinical Dietitian See [facility] for contact information.

## 2022-09-19 NOTE — Progress Notes (Signed)
Pt refused to keep monitor on at night, but she does wear it during the day, she has slept very well tonight, no signs of distress, denies pain, no other needs at this time.

## 2022-09-19 NOTE — Progress Notes (Addendum)
[facility]  PROGRESS NOTE  Stacey Chambers [id number] DOB: January 18, 1951 DOA: 09/15/2022 PCP: Horald Pollen, MD   Brief HPI:   72-year-old female with history of ESRD on hemodialysis MWF, diabetes mellitus type 2, hypothyroidism presented with fever, nonproductive cough.  Symptoms started after dialysis.  CT chest showed pleural effusion, edema and ascites with renal mass.   CT chest obtained at [place] [facility].  Moderate pleural effusions with some associated atelectasis in the lower lobes. There are also areas of atelectasis in the middle lobe and lingula.  2.  Pericardial effusion. Minimal coronary calcification  suspected.  3.  Ascites. Possible cirrhosis.  4.  Small mass anterior lower left kidney, not obviously a simple cyst. Further evaluation needed to exclude malignancy.  5.  Gallstones.   Subjective   Patient seen and examined, complains of sweating at nighttime.  Denies shortness of breath.  Not requiring oxygen.   Assessment/Plan:   Acute hypoxemic respiratory failure due to fluid overload in setting of ESRD -Patient given Lasix -Resolved after hemodialysis  COVID-19 infection -Not requiring oxygen -Started on IV remdesivir, day #3 -Completed 3 days of treatment with remdesivir -Continue Solu-Medrol; start p.o. prednisone from tomorrow -CRP 1.7, D-dimer 0.87 -Chest x-ray showed cardiomegaly with vascular congestion   Renal lesion -Seen on CT chest done at [facility] -Renal ultrasound did not show any mass  Hypothyroidism -Continue Synthroid  ESRD on hemodialysis MWF -Nephrology following  Diabetes mellitus type 2 -Hemoglobin A1c 5.1 -Diet controlled  Hypertension -Continue amlodipine, metoprolol, losartan   Medications     acetaminophen  1,000 mg Oral Once   amLODipine  2.5 mg Oral Daily   Chlorhexidine Gluconate Cloth  6 each Topical Daily   Chlorhexidine Gluconate Cloth  6 each Topical Q0600   [START ON 09/20/2022]  Chlorhexidine Gluconate Cloth  6 each Topical Q0600   darbepoetin (ARANESP) injection - NON-DIALYSIS  60 mcg Subcutaneous Q Sat-1800   doxercalciferol  3 mcg Intravenous Q M,W,F-HD   feeding supplement (NEPRO CARB STEADY)  237 mL Oral Q24H   heparin  5,000 Units Subcutaneous Q8H   levothyroxine  25 mcg Oral Q0600   losartan  50 mg Oral Daily   melatonin  3 mg Oral QHS   metoprolol succinate  50 mg Oral Daily   multivitamin  1 tablet Oral QHS   pantoprazole  40 mg Oral Daily   predniSONE  50 mg Oral Daily   sevelamer carbonate  800 mg Oral TID WC     Data Reviewed:   CBG:  No results for input(s): "GLUCAP" in the last 168 hours.  SpO2: 99 %    Vitals:   09/18/22 1300 09/18/22 1703 09/19/22 0935 09/19/22 1232  BP: (!) 146/70 (!) 157/72 (!) 163/62 (!) 145/58  Pulse: 76   68  Resp: 18 20    Temp: 98.1 F (36.7 C)   98.1 F (36.7 C)  TempSrc: Oral   Oral  SpO2: 99%     Weight:      Height:          Data Reviewed:  Basic Metabolic Panel: Recent Labs  Lab 09/15/22 2351 09/16/22 0515 09/17/22 0451 09/18/22 0325  NA 129* 129* 134* 132*  K 4.0 4.2 4.3 4.6  CL 92* 92* 99 97*  CO2 25 24 20* 19*  GLUCOSE 130* 97 145* 123*  BUN 13 16 39* 62*  CREATININE 3.21* 3.50* 5.03* 6.53*  CALCIUM 8.2* 8.1* 8.7* 8.5*  PHOS  --   --  6.8*  --     CBC: Recent Labs  Lab 09/15/22 2351 09/16/22 0515 09/17/22 0451 09/18/22 0857  WBC 2.6* 2.4* 4.1 5.9  NEUTROABS 1.5*  --   --   --   HGB 9.2* 9.4* 9.7* 10.0*  HCT 26.9* 27.1* 29.2* 29.1*  MCV 103.5* 103.4* 104.7* 104.7*  PLT 41* 40* 55* 65*    LFT Recent Labs  Lab 09/15/22 2351 09/16/22 0515 09/17/22 0451 09/18/22 0325  AST 28 27  --  32  ALT 16 14  --  20  ALKPHOS 100 86  --  103  BILITOT 0.6 1.0  --  0.5  PROT 7.3 7.1  --  7.4  ALBUMIN 3.2* 3.1* 3.1* 3.1*     Antibiotics: Anti-infectives (From admission, onward)    Start     Dose/Rate Route Frequency Ordered Stop   09/17/22 1000  remdesivir 100 mg in  sodium chloride 0.9 % 100 mL IVPB       See Hyperspace for full Linked Orders Report.   100 mg 200 mL/hr over 30 Minutes Intravenous Daily 09/16/22 0424 09/18/22 1353   09/16/22 0600  remdesivir 200 mg in sodium chloride 0.9% 250 mL IVPB       See Hyperspace for full Linked Orders Report.   200 mg 580 mL/hr over 30 Minutes Intravenous Once 09/16/22 0424 09/16/22 0722   09/16/22 0000  vancomycin (VANCOREADY) IVPB 1250 mg/250 mL        1,250 mg 166.7 mL/hr over 90 Minutes Intravenous  Once 09/15/22 2346 09/16/22 0338   09/15/22 2345  ceFEPIme (MAXIPIME) 2 g in sodium chloride 0.9 % 100 mL IVPB        2 g 200 mL/hr over 30 Minutes Intravenous  Once 09/15/22 2340 09/16/22 0043   09/15/22 2345  metroNIDAZOLE (FLAGYL) IVPB 500 mg        500 mg 100 mL/hr over 60 Minutes Intravenous  Once 09/15/22 2340 09/16/22 0237   09/15/22 2345  vancomycin (VANCOCIN) IVPB 1000 mg/200 mL premix  Status:  Discontinued        1,000 mg 200 mL/hr over 60 Minutes Intravenous  Once 09/15/22 2340 09/15/22 2345        DVT prophylaxis: Heparin  Code Status: Full code  Family Communication:    CONSULTS nephrology   Objective    Physical Examination:  Appears in no acute distress S1-S2, regular, no murmur auscultated Lungs clear to auscultation bilaterally Abdomen is soft, nontender, no organomegaly chest  Status is: Inpatient:             [name]   [facility] If 7PM-7AM, please contact night-coverage at [email or web address], Office  [phone]   09/19/2022, 3:39 PM  LOS: 3 days

## 2022-09-20 DIAGNOSIS — E8779 Other fluid overload: Secondary | ICD-10-CM | POA: Diagnosis not present

## 2022-09-20 LAB — RENAL FUNCTION PANEL
Albumin: 3.3 g/dL — ABNORMAL LOW (ref 3.5–5.0)
Anion gap: 13 (ref 5–15)
BUN: 21 mg/dL (ref 8–23)
CO2: 25 mmol/L (ref 22–32)
Calcium: 8.7 mg/dL — ABNORMAL LOW (ref 8.9–10.3)
Chloride: 96 mmol/L — ABNORMAL LOW (ref 98–111)
Creatinine, Ser: 3.09 mg/dL — ABNORMAL HIGH (ref 0.44–1.00)
GFR, Estimated: 16 mL/min — ABNORMAL LOW (ref >60)
Glucose, Bld: 298 mg/dL — ABNORMAL HIGH (ref 70–99)
Phosphorus: 1.6 mg/dL — ABNORMAL LOW (ref 2.5–4.6)
Potassium: 3.5 mmol/L (ref 3.5–5.1)
Sodium: 134 mmol/L — ABNORMAL LOW (ref 135–145)

## 2022-09-20 LAB — CBC
HCT: 32 % — ABNORMAL LOW (ref 36.0–46.0)
Hemoglobin: 10.8 g/dL — ABNORMAL LOW (ref 12.0–15.0)
MCH: 34.7 pg — ABNORMAL HIGH (ref 26.0–34.0)
MCHC: 33.8 g/dL (ref 30.0–36.0)
MCV: 102.9 fL — ABNORMAL HIGH (ref 80.0–100.0)
Platelets: 79 10*3/uL — ABNORMAL LOW (ref 150–400)
RBC: 3.11 MIL/uL — ABNORMAL LOW (ref 3.87–5.11)
RDW: 14.2 % (ref 11.5–15.5)
WBC: 5.6 10*3/uL (ref 4.0–10.5)
nRBC: 0 % (ref 0.0–0.2)

## 2022-09-20 MED ORDER — PENTAFLUOROPROP-TETRAFLUOROETH EX AERO: 1.0000 | INHALATION_SPRAY | CUTANEOUS | Status: DC | PRN

## 2022-09-20 NOTE — Progress Notes (Signed)
Patient refused 0000 vital signs. Patient is alert x4. Notified Dr. Irene Pap.

## 2022-09-20 NOTE — Procedures (Signed)
HD Note:  Some information was entered later than the data was gathered due to patient care needs. The stated time with the data is accurate.  Treatment done in patient room,  isolation of contact and airborne status  Alert and oriented.  Informed consent signed and in chart.   TX duration: 3.5  Patient tolerated well.  Patient rested with eyes closed during most of the treatment.  No complaints or issues noted  Alert, without acute distress.  Hand-off given to patient's nurse.   Access used: Upper Left arm fistula Access issues: No  issues  Total UF removed: 2000 ml Medications given as prescribed, see MAR   [facility]

## 2022-09-20 NOTE — Progress Notes (Signed)
[facility] Progress Note   Subjective:   Patient seen and examined at bedside during dialysis.  Tolerating treatment well so far.  Sleeping but easily awoken.  No new complaints.   Objective Vitals:   09/20/22 1100 09/20/22 1115 09/20/22 1130 09/20/22 1137  BP: (!) 146/67 (!) 150/61 (!) 149/63 (!) 149/63  Pulse: 64 64 64 64  Resp: 15 15 15 15   Temp:      TempSrc:      SpO2: 98% 97% 97% 97%  Weight:      Height:       Physical Exam General:WDWN female in NAD Heart:RRR, no mrg Lungs:CTAB anterolaterally, nml WOB on RA Abdomen:soft, NTND Extremities:no LE edema Dialysis Access: LU AVF in use   Filed Weights   09/18/22 1220 09/20/22 0500 09/20/22 0745  Weight: 63.5 kg 63.5 kg 64.8 kg   No intake or output data in the 24 hours ending 09/20/22 1206  Additional Objective Labs: Basic Metabolic Panel: Recent Labs  Lab 09/16/22 0515 09/17/22 0451 09/18/22 0325  NA 129* 134* 132*  K 4.2 4.3 4.6  CL 92* 99 97*  CO2 24 20* 19*  GLUCOSE 97 145* 123*  BUN 16 39* 62*  CREATININE 3.50* 5.03* 6.53*  CALCIUM 8.1* 8.7* 8.5*  PHOS  --  6.8*  --    Liver Function Tests: Recent Labs  Lab 09/15/22 2351 09/16/22 0515 09/17/22 0451 09/18/22 0325  AST 28 27  --  32  ALT 16 14  --  20  ALKPHOS 100 86  --  103  BILITOT 0.6 1.0  --  0.5  PROT 7.3 7.1  --  7.4  ALBUMIN 3.2* 3.1* 3.1* 3.1*    CBC: Recent Labs  Lab 09/15/22 2351 09/16/22 0515 09/17/22 0451 09/18/22 0857  WBC 2.6* 2.4* 4.1 5.9  NEUTROABS 1.5*  --   --   --   HGB 9.2* 9.4* 9.7* 10.0*  HCT 26.9* 27.1* 29.2* 29.1*  MCV 103.5* 103.4* 104.7* 104.7*  PLT 41* 40* 55* 65*   Blood Culture    Component Value Date/Time   SDES BLOOD SITE NOT SPECIFIED 09/16/2022 0010   SPECREQUEST  09/16/2022 0010    BOTTLES DRAWN AEROBIC AND ANAEROBIC Blood Culture results may not be optimal due to an excessive volume of blood received in culture bottles   CULT  09/16/2022 0010    NO GROWTH 4 DAYS Performed  at [facility], [address] [place], [place] [address]    REPTSTATUS PENDING 09/16/2022 0010    Medications:  anticoagulant sodium citrate      acetaminophen  1,000 mg Oral Once   amLODipine  2.5 mg Oral Daily   Chlorhexidine Gluconate Cloth  6 each Topical Daily   Chlorhexidine Gluconate Cloth  6 each Topical Q0600   Chlorhexidine Gluconate Cloth  6 each Topical Q0600   darbepoetin (ARANESP) injection - NON-DIALYSIS  60 mcg Subcutaneous Q Sat-1800   doxercalciferol  3 mcg Intravenous Q M,W,F-HD   feeding supplement (NEPRO CARB STEADY)  237 mL Oral Q24H   heparin  5,000 Units Subcutaneous Q8H   levothyroxine  25 mcg Oral Q0600   losartan  50 mg Oral Daily   melatonin  3 mg Oral QHS   metoprolol succinate  50 mg Oral Daily   multivitamin  1 tablet Oral QHS   pantoprazole  40 mg Oral Daily   predniSONE  50 mg Oral Daily   sevelamer carbonate  800 mg Oral TID WC  Dialysis Orders: [facility] MWF 3.5 hours  400 BF and DF auto 1.5 EDW 62.7 kg 2K/2 calcium AVF Last post weight 62.9 kg on 3/15 Meds: hectorol 3 mcg three times a week with HD.  Not currently ordered ESA but Mircera 50 mcg last given on 08/28/22.  She is not on heparin   Assessment/Plan: 1. COVID PNA - on steroids/remdesivir.  Needs negative COVID test to ride transportation to dialysis.  2. Hyponatremia - improved with HD.  3. Thrombocytopenia - improving.  Not on heparin.  4. Acute hypoxemic respiratory failure - improved.  Not requiring O2.  5. ESRD -On HD MWF.  HD today per regular schedule.  6. Anemia of CKD- Hgb 10. Tsat 20%. Resume ESA, Aranesp 52mcg qwk. 7. Secondary hyperparathyroidism - Ca at goal.  Phos elevated.  Continue binders and VDRA.  8. HTN/volume - BP variable. Continue home meds.  Does not appear grossly volume overloaded. Continue UF as tolerated.  Getting standing weight when able.  9. Nutrition - Renal diet w/fluid restrictions.  10. L renal mass - noted  on outside imaging. Renal US with no definite renal mass identified.    [name] Mow, PA-C [facility] 09/20/2022,12:06 PM  LOS: 4 days

## 2022-09-20 NOTE — Hospital Course (Signed)
72 year old female with PMH of ESRD on HD MWF, type II DM, hypothyroidism, HTN present to the hospital with complaints of cough and shortness of breath as well as fever.  Was found to have COVID 19 positivity.  Treated with remdesivir.  Symptoms actually resolved rapidly after initiation of HD thus most likely presentation is because of volume overload.

## 2022-09-20 NOTE — Progress Notes (Signed)
[facility] Progress Note Patient: Stacey Chambers [id number] DOB: [birth year] DOA: 09/15/2022  DOS: the patient was seen and examined on 09/20/2022  Brief hospital course: 72 year old female with PMH of ESRD on HD MWF, type II DM, hypothyroidism, HTN present to the hospital with complaints of cough and shortness of breath as well as fever.  Was found to have COVID 19 positivity.  Treated with remdesivir.  Symptoms actually resolved rapidly after initiation of HD thus most likely presentation is because of volume overload. Assessment and Plan: Acute on chronic HFpEF. Presents with complaints of cough and shortness of breath. Mild hypoxia of 89% on exam by admission. Treated with IV Lasix as well as diuresis with HD. Currently respiratory symptoms currently resolved.  COVID-19 infection. CT value 17.5 Suspected dental finding. Treated with IV remdesivir as well as steroids. Currently steroids are being tapered. No pulmonary findings at the time of my examination.  ESRD on HD. HD on MWF. Currently nephrology consulted. Outpatient patient uses transport services as patient does not have anybody who can transport her back and forth to HD and the transport service is refusing to take the patient unless she has 2 negative COVID test. I have requested social worker to identify who I should be talking with given there is no rationale for a negative COVID test before patient can be released from isolation. Will monitor.  Anemia of chronic kidney disease. Hemoglobin stable.  Monitor.  X-rays hypertension. Blood pressure elevated. Management per nephrology. Continue current blood pressure medication.  Incidental left renal mass-not seen on ultrasound. Outside imaging showed evidence of a possible left renal mass. Ultrasound here does not show any evidence of renal mass. Recommend recheck in a few weeks.  Chronic thrombocytopenia. Platelet counts are stable in the low  70s. Monitor.  Mild hyponatremia. Management per nephrology. Monitor.  Subjective: No nausea or vomiting no fever no chills.  No cough no shortness of breath.  Physical Exam: General: in Mild distress, No Rash Cardiovascular: S1 and S2 Present, No Murmur Respiratory: Good respiratory effort, Bilateral Air entry present. No Crackles, No wheezes Abdomen: Bowel Sound present, No tenderness Extremities: No edema Neuro: Alert and oriented x3, no new focal deficit  Data Reviewed: I have Reviewed nursing notes, Vitals, and Lab results. Since last encounter, pertinent lab results CBC and BMP   . I have ordered test including CBC and BMP  .   Disposition: Status is: Inpatient Remains inpatient appropriate because: Awaiting safe discharge plan.  Need transportation to HD center.  heparin injection 5,000 Units Start: 09/16/22 0600   Family Communication: No one at bedside Level of care: Progressive   Vitals:   09/20/22 1115 09/20/22 1130 09/20/22 1137 09/20/22 1156  BP: (!) 150/61 (!) 149/63 (!) 149/63 (!) 163/70  Pulse: 64 64 64 66  Resp: 15 15 15 20   Temp:      TempSrc:      SpO2: 97% 97% 97% 97%  Weight:    62.8 kg  Height:         Author: Berle Mull, MD 09/20/2022 6:50 PM  Please look on [email or web address] to find out who is on call.

## 2022-09-20 NOTE — TOC Initial Note (Signed)
Transition of Care [facility]) - Initial/Assessment Note    Patient Details  Name: Stacey Chambers MRN: [id number] Date of Birth: 1951-05-13  Transition of Care [facility]) CM/SW Contact:    Benard Halsted, LCSW Phone Number: 09/20/2022, 12:23 PM  Clinical Narrative:                 CSW escalated transportation issue to [place].         Patient Goals and CMS Choice            Expected Discharge Plan and Services                                              Prior Living Arrangements/Services                       Activities of Daily Living Home Assistive Devices/Equipment: Dentures (specify type), Eyeglasses ADL Screening (condition at time of admission) Patient's cognitive ability adequate to safely complete daily activities?: Yes Is the patient deaf or have difficulty hearing?: No Does the patient have difficulty seeing, even when wearing glasses/contacts?: Yes Does the patient have difficulty concentrating, remembering, or making decisions?: No Patient able to express need for assistance with ADLs?: Yes Does the patient have difficulty dressing or bathing?: No Independently performs ADLs?: Yes (appropriate for developmental age) Does the patient have difficulty walking or climbing stairs?: No Weakness of Legs: None Weakness of Arms/Hands: None  Permission Sought/Granted                  Emotional Assessment              Admission diagnosis:  Acute pulmonary edema ([place]) [J81.0] Pleural effusion [J90] Thrombocytopenia ([place]) [D69.6] Other ascites [R18.8] Elevated troponin I level [R79.89] Fluid overload [E87.70] Iron deficiency anemia, unspecified iron deficiency anemia type [D50.9] Sepsis, due to unspecified organism, unspecified whether acute organ dysfunction present ([place]) [A41.9] COVID-19 [U07.1] Patient Active Problem List   Diagnosis Date Noted   Fluid overload 09/16/2022   Lumbar post-laminectomy syndrome  07/12/2022   Other chronic pain 07/12/2022   Sacrococcygeal disorders, not elsewhere classified 07/12/2022   Vertebrogenic low back pain 07/12/2022   Hypertension associated with diabetes ([place]) 03/30/2022   End stage renal disease on dialysis ([place]) 03/30/2022   Liver disease, chronic, with cirrhosis ([place]) 78/29/5621   Acute diastolic CHF (congestive heart failure) ([place]) 12/10/2021   Hypertensive urgency 12/10/2021   Diabetes mellitus type 2, controlled, without complications ([place]) 30/86/5784   Acute renal failure superimposed on stage 3b chronic kidney disease ([place]) 12/10/2021   Nephrotic syndrome 12/10/2021   Normocytic anemia 12/10/2021   Low serum albumin 12/10/2021   Thrombocytopenia ([place]) 12/10/2021   Cirrhosis ([place]) 12/01/2021   Swelling of lower extremity 12/01/2021   Acute kidney injury superimposed on CKD ([place]) 12/01/2021   Diabetes mellitus type 2 with complications ([place]) 69/62/9528   Abnormal LFTs 04/29/2021   Iron deficiency 04/29/2021   Gastric ulcer 04/29/2021   Splenic lesion 04/29/2021   History of gastric ulcer 03/14/2021   History of thrombocytopenia 03/14/2021   History of iron deficiency anemia 03/14/2021   Macrocytosis 03/14/2021   PCP:  Horald Pollen, MD Pharmacy:   [id number] [place], [place] [address] [place] [address] Phone: [phone] Fax: [place]  [place] [address] Phone: [phone] Fax: [phone]     Social Determinants of Health (SDOH) Social History: SDOH Screenings   Depression (PHQ2-9): Low Risk  (07/12/2022)  Tobacco Use: Low Risk  (09/15/2022)   SDOH Interventions:     Readmission Risk Interventions     No data to display

## 2022-09-21 LAB — CBC WITH DIFFERENTIAL/PLATELET
Abs Immature Granulocytes: 0.04 10*3/uL (ref 0.00–0.07)
Basophils Absolute: 0 10*3/uL (ref 0.0–0.1)
Basophils Relative: 0 %
Eosinophils Absolute: 0 10*3/uL (ref 0.0–0.5)
Eosinophils Relative: 0 %
HCT: 31.3 % — ABNORMAL LOW (ref 36.0–46.0)
Hemoglobin: 10.7 g/dL — ABNORMAL LOW (ref 12.0–15.0)
Immature Granulocytes: 1 %
Lymphocytes Relative: 15 %
Lymphs Abs: 0.9 10*3/uL (ref 0.7–4.0)
MCH: 35.3 pg — ABNORMAL HIGH (ref 26.0–34.0)
MCHC: 34.2 g/dL (ref 30.0–36.0)
MCV: 103.3 fL — ABNORMAL HIGH (ref 80.0–100.0)
Monocytes Absolute: 0.7 10*3/uL (ref 0.1–1.0)
Monocytes Relative: 12 %
Neutro Abs: 4.4 10*3/uL (ref 1.7–7.7)
Neutrophils Relative %: 72 %
Platelets: 79 10*3/uL — ABNORMAL LOW (ref 150–400)
RBC: 3.03 MIL/uL — ABNORMAL LOW (ref 3.87–5.11)
RDW: 14.1 % (ref 11.5–15.5)
WBC: 6.1 10*3/uL (ref 4.0–10.5)
nRBC: 0 % (ref 0.0–0.2)

## 2022-09-21 LAB — RESP PANEL BY RT-PCR (RSV, FLU A&B, COVID)  RVPGX2
Influenza A by PCR: NEGATIVE
Influenza B by PCR: NEGATIVE
Resp Syncytial Virus by PCR: NEGATIVE
SARS Coronavirus 2 by RT PCR: POSITIVE — AB

## 2022-09-21 LAB — COMPREHENSIVE METABOLIC PANEL
ALT: 70 U/L — ABNORMAL HIGH (ref 0–44)
AST: 110 U/L — ABNORMAL HIGH (ref 15–41)
Albumin: 3.1 g/dL — ABNORMAL LOW (ref 3.5–5.0)
Alkaline Phosphatase: 97 U/L (ref 38–126)
Anion gap: 11 (ref 5–15)
BUN: 35 mg/dL — ABNORMAL HIGH (ref 8–23)
CO2: 27 mmol/L (ref 22–32)
Calcium: 9.2 mg/dL (ref 8.9–10.3)
Chloride: 99 mmol/L (ref 98–111)
Creatinine, Ser: 4.25 mg/dL — ABNORMAL HIGH (ref 0.44–1.00)
GFR, Estimated: 11 mL/min — ABNORMAL LOW (ref >60)
Glucose, Bld: 135 mg/dL — ABNORMAL HIGH (ref 70–99)
Potassium: 3.7 mmol/L (ref 3.5–5.1)
Sodium: 137 mmol/L (ref 135–145)
Total Bilirubin: 0.7 mg/dL (ref 0.3–1.2)
Total Protein: 7.8 g/dL (ref 6.5–8.1)

## 2022-09-21 LAB — CULTURE, BLOOD (ROUTINE X 2)
Culture: NO GROWTH
Culture: NO GROWTH
Special Requests: ADEQUATE

## 2022-09-21 LAB — MAGNESIUM: Magnesium: 2.2 mg/dL (ref 1.7–2.4)

## 2022-09-21 MED ORDER — CHLORHEXIDINE GLUCONATE CLOTH 2 % EX PADS: 6.0000 | MEDICATED_PAD | Freq: Every day | CUTANEOUS | Status: DC

## 2022-09-21 MED ORDER — PREDNISONE 10 MG PO TABS: ORAL_TABLET | ORAL | 0 refills | Status: DC

## 2022-09-21 NOTE — Progress Notes (Addendum)
Contacted by renal PA that pt advised her this morning that pt has a friend that can transport pt to HD until pt is able to resume transportation. Spoke to pt via phone with on-site [place] interpreter via a 3 way call. Pt confirms that she has a friend, Stacey Chambers, that can transport pt to HD at d/c. Pt states Stacey Chambers can  transport pt to HD tomorrow if pt is d/c today. Pt advised that her transportation is requesting a negative covid test prior to her being able to resume transportation services. Pt advised that if test is not performed here at hospital prior to d/c, then pt will need to get tested somewhere in the community. Pt advised that she will likely need to get the assistance from clinic social worker to get test results to transportation company via email. Pt voices understanding but is requesting a test be completed inpt if possible. Pt advised she will need to resume at her regular HD clinic and regular time [facility] SW MWF arrive at 6:15 for 6:30 chair time). Will add these arrangements to pt's AVS so she can provide clinic address to friend who will transport pt to HD appts. Above details provided to renal PA and CSW. Will assist as needed.   Melven Sartorius Renal Navigator [phone]  Addendum at 4:19 pm: D/C order noted. Contacted [place] SW and spoke to RN to advise staff of pt's d/c this afternoon and confirm pt is on tomorrow's schedule. RN confirms pt is on tomorrow's schedule. Clinic also advised that CSW made pt an appt with PCP on 4/2 for a repeat covid test. Contacted renal PA regarding pt's d/c.

## 2022-09-21 NOTE — Consult Note (Signed)
   [facility] CM Inpatient Consult   09/21/2022  Special [place] [birth year] [id number]   .[facility] [ACO] Patient: Marine scientist  Primary Care Provider: Horald Pollen, MD with [place] [facility] which    Referral:  Secure Chat [Transportation and COVID testing post hospital] remote review  We have reviewed your referral request for COVID testing from inpatient TOC LCSW, Percell Locus. Currently, in checking with [place] testing programs are no longer available. However, [facility] and spoke with pharmacy. Since the COVID test do not require a prescription it was suggested to see if patient could get test shipped or picked up at the non-prescription area of the pharmacy. Notified that patient will need to have test done in person now. Information provided to inpatient TOC LCSW.   Plan:  Patient to have post hospital follow up with [facility] with PCP office.  Of note, [facility] Care Management does not interfere with of replace any services arranged by the Inpatient [facility] team. For questions, please contact:  Natividad Brood, RN BSN [place]  [phone] business mobile phone Toll free office [phone]  *[place]  [phone] Fax number: [phone] [name]

## 2022-09-21 NOTE — Progress Notes (Signed)
[facility] Progress Note   Subjective:   Patient seen and examined at bedside. Patient reports she now has a friend who is willing to take her to and from dialysis until she is able to ride transportation again.  Feeling better.  Still not sleeping well due to night sweats.  No other complaints.   Objective Vitals:   09/20/22 2204 09/21/22 0442 09/21/22 0500 09/21/22 0907  BP: (!) 171/74 (!) 175/76  (!) 151/59  Pulse: 73 70  72  Resp: 16 14    Temp: 97.6 F (36.4 C) 97.8 F (36.6 C)    TempSrc: Oral Oral    SpO2: 100% 99%  100%  Weight:   62.9 kg   Height:       Physical Exam General:alert, WDWN female in NAD Heart:RRR, no mrg Lungs:CTAB, nml WOB on RA Abdomen:soft, NTND Extremities:no LE edema Dialysis Access: LU AVF +b/t   Filed Weights   09/20/22 0745 09/20/22 1156 09/21/22 0500  Weight: 64.8 kg 62.8 kg 62.9 kg    Intake/Output Summary (Last 24 hours) at 09/21/2022 1148 Last data filed at 09/20/2022 1156 Gross per 24 hour  Intake --  Output 2000 ml  Net -2000 ml    Additional Objective Labs: Basic Metabolic Panel: Recent Labs  Lab 09/17/22 0451 09/18/22 0325 09/20/22 1501 09/21/22 0434  NA 134* 132* 134* 137  K 4.3 4.6 3.5 3.7  CL 99 97* 96* 99  CO2 20* 19* 25 27  GLUCOSE 145* 123* 298* 135*  BUN 39* 62* 21 35*  CREATININE 5.03* 6.53* 3.09* 4.25*  CALCIUM 8.7* 8.5* 8.7* 9.2  PHOS 6.8*  --  1.6*  --    Liver Function Tests: Recent Labs  Lab 09/16/22 0515 09/17/22 0451 09/18/22 0325 09/20/22 1501 09/21/22 0434  AST 27  --  32  --  110*  ALT 14  --  20  --  70*  ALKPHOS 86  --  103  --  97  BILITOT 1.0  --  0.5  --  0.7  PROT 7.1  --  7.4  --  7.8  ALBUMIN 3.1*   < > 3.1* 3.3* 3.1*   < > = values in this interval not displayed.   CBC: Recent Labs  Lab 09/15/22 2351 09/16/22 0515 09/17/22 0451 09/18/22 0857 09/20/22 1501 09/21/22 0434  WBC 2.6* 2.4* 4.1 5.9 5.6 6.1  NEUTROABS 1.5*  --   --   --   --  4.4  HGB 9.2*  9.4* 9.7* 10.0* 10.8* 10.7*  HCT 26.9* 27.1* 29.2* 29.1* 32.0* 31.3*  MCV 103.5* 103.4* 104.7* 104.7* 102.9* 103.3*  PLT 41* 40* 55* 65* 79* 79*   CBG: Recent Labs  Lab 09/19/22 2248  GLUCAP 152*   Medications:  anticoagulant sodium citrate      acetaminophen  1,000 mg Oral Once   amLODipine  2.5 mg Oral Daily   Chlorhexidine Gluconate Cloth  6 each Topical Daily   Chlorhexidine Gluconate Cloth  6 each Topical Q0600   Chlorhexidine Gluconate Cloth  6 each Topical Q0600   darbepoetin (ARANESP) injection - NON-DIALYSIS  60 mcg Subcutaneous Q Sat-1800   doxercalciferol  3 mcg Intravenous Q M,W,F-HD   feeding supplement (NEPRO CARB STEADY)  237 mL Oral Q24H   heparin  5,000 Units Subcutaneous Q8H   levothyroxine  25 mcg Oral Q0600   losartan  50 mg Oral Daily   melatonin  3 mg Oral QHS   metoprolol succinate  50 mg Oral  Daily   multivitamin  1 tablet Oral QHS   pantoprazole  40 mg Oral Daily   predniSONE  50 mg Oral Daily   sevelamer carbonate  800 mg Oral TID WC    Dialysis Orders: [place] [facility] MWF 3.5 hours  400 BF and DF auto 1.5 EDW 62.7 kg 2K/2 calcium AVF Last post weight 62.9 kg on 3/15 Meds: hectorol 3 mcg three times a week with HD.  Not currently ordered ESA but Mircera 50 mcg last given on 08/28/22.  She is not on heparin   Assessment/Plan: 1. COVID PNA - on steroids/remdesivir.  Needs negative COVID test to ride transportation to dialysis per transportation covid protocols.  2. Hyponatremia - improved with HD.  3. Thrombocytopenia - improving.  Not on heparin.  4. Acute hypoxemic respiratory failure - improved.  Not requiring O2.  5. ESRD -On HD MWF.  HD tomorrow per regular schedule. Now has a ride to HD.   6. Anemia of CKD- Hgb 10.7. Tsat 20%. Aranesp 51mcg qwk. 7. Secondary hyperparathyroidism - Ca at goal.  Phos elevated originally, low post HD.  Repeat labs outpatient HD.  Continue binders and VDRA.  8. HTN/volume - BP variable.  Continue home meds.  Does not appear grossly volume overloaded. Continue UF as tolerated.  Right at edw post HD yesterday. 9. Nutrition - Renal diet w/fluid restrictions.  10. L renal mass - noted on outside imaging. Renal US with no definite renal mass identified. 11. Dispo - ok for d/c from renal standpoint  [name] Mow, PA-C [place] 09/21/2022,11:48 AM  LOS: 5 days

## 2022-09-21 NOTE — TOC Progression Note (Addendum)
Transition of Care [facility]) - Progression Note    Patient Details  Name: Stacey Chambers MRN: [id number] Date of Birth: 1951/06/10  Transition of Care [facility]) CM/SW [place], LCSW Phone Number: 09/21/2022, 2:12 PM  Clinical Narrative:    2pm-CSW spoke with [facility] to see if they can assist patient in getting covid tested at home if her COVID test comes back positive; [name] will follow up with CSW. TOC Supervisor has not had any luck contacting the Medicaid Director to discuss transportation barrier.   3:26pm-Per [name], [facility] no longer has any COVID programs. Patient's COVID results are still positive.   4:15pm-CSW contacted patient's PCP Office and asked if they would be able to test the patient again and send the results to her transportation company. Appointment made for 4/2 and patient's PCP is able to speak [name] to assist her. Info placed on AVS in [name] along with the email for [employer] so results can be sent to them. CSW contacted patient via [name] interpretor and patient reported agreement with plan and she stated she will have transportation to dialysis tomorrow. She stated she is ready to discharge and will call her ride once RN goes over instructions. She requested 2 masks; CSW made RN aware. Discussed plan with Renal Navigator. No other needs identified at this time.    Expected Discharge Plan: Home/Self Care Barriers to Discharge:  (Transport to HD due to COVID+)  Expected Discharge Plan and Services     Post Acute Care Choice: Dialysis Living arrangements for the past 2 months: Single Family Home                                       Social Determinants of Health (SDOH) Interventions SDOH Screenings   Depression (PHQ2-9): Low Risk  (07/12/2022)  Tobacco Use: Low Risk  (09/15/2022)    Readmission Risk Interventions     No data to display

## 2022-09-21 NOTE — Discharge Summary (Signed)
Physician Discharge Summary   Patient: Stacey Chambers MRN: [id number] DOB: 27-Jul-1950  Admit date:     09/15/2022  Discharge date: 09/21/22  Discharge Physician: Berle Mull  PCP: Horald Pollen, MD  Recommendations at discharge: Follow-up with PCP in 1 week. Recommend recheck ultrasound renal in a few weeks.   Follow-up Information     Horald Pollen, MD. Go on 10/03/2022.   Specialty: Internal Medicine Why: Cita a las 10:20 a.m.-Acuda a esta cita para una prueba de covid. Pide a tu mdico que [place] por correo Emergency planning/management officer a Theatre stage manager [employer]: [email or web address] (Su nmero es [phone]). Contact information: [place] [address] [place], [place]. Go on 09/22/2022.   Why: Schedule is Monday/Wednesday/Friday.  Arrive at 6:15 am for 6:30 am chair time. Contact information: [place] [address] [phone]         Horald Pollen, MD. Go on 10/11/2022.   Specialty: Internal Medicine Why: 10:20am for hospital follow up visit Contact information: [place] [address] [phone]                Discharge Diagnoses: Principal Problem:   Fluid overload Active Problems:   History of gastric ulcer   History of thrombocytopenia   History of iron deficiency anemia   Cirrhosis (HCC)   Acute diastolic CHF (congestive heart failure) ([place])   COVID-19 virus infection   Diabetes mellitus type 2, controlled, without complications ([place])   End stage renal disease on dialysis [facility])  Hospital Course: 72 year old female with PMH of ESRD on HD MWF, type II DM, hypothyroidism, HTN present to the hospital with complaints of cough and shortness of breath as well as fever.  Was found to have COVID 19 positivity.  Treated with remdesivir.  Symptoms actually resolved rapidly after initiation of HD thus most likely presentation is because of volume  overload. Assessment and Plan  Acute on chronic HFpEF. Presents with complaints of cough and shortness of breath. Mild hypoxia of 89% on exam by admission. Treated with IV Lasix as well as diuresis with HD. Currently respiratory symptoms currently resolved.  Back on room air.   COVID-19 infection. CT value 17.5 at the time of admission.  Repeat CT value 21 on 3/21. Suspect incidental finding. Treated with IV remdesivir as well as steroids. Currently steroids are being tapered. No pulmonary findings at the time of my examination.   ESRD on HD. HD on MWF. Currently nephrology consulted. Outpatient patient uses transport services as patient does not have anybody who can transport her back and forth to HD and the transport service is refusing to take the patient unless she has 2 negative COVID test. I have requested social worker to identify who I should be talking with given there is no rationale for a negative COVID test before patient can be released from isolation. Patient will be in isolation at HD until 3/29.   Anemia of chronic kidney disease. Hemoglobin stable.  Monitor.   X-rays hypertension. Blood pressure elevated. Management per nephrology. Continue current blood pressure medication.   Incidental left renal mass-not seen on ultrasound. Outside imaging showed evidence of a possible left renal mass. Ultrasound here does not show any evidence of renal mass. Recommend recheck in a few weeks.   Chronic thrombocytopenia. Platelet counts are stable in the low 70s.   Mild hyponatremia. Management per nephrology.  Consultants:  none  Procedures performed:  none  DISCHARGE MEDICATION: Allergies as of 09/21/2022   No Known Allergies      Medication List     TAKE these medications    Accu-Chek Guide test strip Generic drug: glucose blood   Accu-Chek Softclix Lancets lancets   acetaminophen 500 MG tablet Commonly known as: TYLENOL Take 500 mg by mouth every  8 (eight) hours as needed for moderate pain.   amLODipine 2.5 MG tablet Commonly known as: NORVASC Take 2.5 mg by mouth daily.   DIALYVITE 800 WITH ZINC 0.8 MG Tabs Take 1 tablet by mouth at bedtime.   esomeprazole 40 MG capsule Commonly known as: NexIUM Take 1 capsule (40 mg total) by mouth 2 (two) times daily.   ferrous gluconate 324 MG tablet Commonly known as: FERGON Take 1 tablet (324 mg total) by mouth daily with breakfast.   levothyroxine 25 MCG tablet Commonly known as: SYNTHROID Take 25 mcg by mouth daily.   lidocaine-prilocaine cream Commonly known as: EMLA Apply 1 Application topically 3 (three) times a week.   losartan 50 MG tablet Commonly known as: COZAAR Take 50 mg by mouth daily.   metoprolol succinate 50 MG 24 hr tablet Commonly known as: TOPROL-XL Take 50 mg by mouth daily.   mirtazapine 7.5 MG tablet Commonly known as: REMERON Take 7.5 mg by mouth at bedtime.   predniSONE 10 MG tablet Commonly known as: DELTASONE Take 40mg  daily for 3days,Take 30mg  daily for 3days,Take 20mg  daily for 3days,Take 10mg  daily for 3days, then stop Start taking on: September 22, 2022   sevelamer carbonate 800 MG tablet Commonly known as: RENVELA Take 800 mg by mouth 3 (three) times daily.       Disposition: Home Diet recommendation: Renal diet  Discharge Exam: Vitals:   09/20/22 2204 09/21/22 0442 09/21/22 0500 09/21/22 0907  BP: (!) 171/74 (!) 175/76  (!) 151/59  Pulse: 73 70  72  Resp: 16 14    Temp: 97.6 F (36.4 C) 97.8 F (36.6 C)    TempSrc: Oral Oral    SpO2: 100% 99%  100%  Weight:   62.9 kg   Height:      Interpreter was used General: Appear in no distress; no visible Abnormal Neck Mass Or lumps, Conjunctiva normal Cardiovascular: S1 and S2 Present, no Murmur, Respiratory: good respiratory effort, Bilateral Air entry present and CTA, no Crackles, no wheezes Abdomen: Bowel Sound present, Non tender  Extremities: no Pedal edema Neurology: alert  and oriented to time, place, and person  Filed Weights   09/20/22 0745 09/20/22 1156 09/21/22 0500  Weight: 64.8 kg 62.8 kg 62.9 kg   Condition at discharge: stable  The results of significant diagnostics from this hospitalization (including imaging, microbiology, ancillary and laboratory) are listed below for reference.   Imaging Studies: US RENAL  Result Date: 09/17/2022 CLINICAL DATA:  Renal mass EXAM: RENAL / URINARY TRACT ULTRASOUND COMPLETE COMPARISON:  MRI 06/29/2021, CT 07/10/2022. FINDINGS: Right Kidney: Renal measurements: 6.1 x 3.0 x 3.6 cm = volume: 35 mL. Echogenic renal cortex. No hydronephrosis. Appears mildly atrophic in comparison to the left. No definite renal mass identified sonographically. Left Kidney: Renal measurements: 8.6 x 4.2 x 4.1 cm = volume: 77 mL. Echogenic renal cortex. No hydronephrosis. No definite renal mass identified sonographically. Bladder: Decompressed. Other: Abdominal ascites noted.  Cirrhotic liver morphology. IMPRESSION: No hydronephrosis. Echogenic kidneys, as can be seen in medical renal disease. No definite renal mass identified sonographically. If this is a clinical concern, would recommend a contrast  enhanced CT or MRI. Cirrhotic liver morphology with abdominal ascites noted. Electronically Signed   By: Maurine Simmering M.D.   On: 09/17/2022 18:28   ECHOCARDIOGRAM COMPLETE  Result Date: 09/16/2022    ECHOCARDIOGRAM REPORT   Patient Name:   Stacey Chambers Date of Exam: 09/16/2022 Medical Rec #:  [id number]             Height:       59.0 in Accession #:    [id number]            Weight:       146.8 lb Date of Birth:  [birth year]            BSA:          1.617 m Patient Age:    50 years              BP:           166/95 mmHg Patient Gender: F                     HR:           81 bpm. Exam Location:  Inpatient Procedure: 2D Echo, Cardiac Doppler and Color Doppler Indications:    CHF-Acute Systolic [id number]  History:        Patient has prior history of  Echocardiogram examinations, most                 recent 12/02/2021. ESRD, Signs/Symptoms:Shortness of Breath; Risk                 Factors:Hypertension, Dyslipidemia and Non-Smoker.  Sonographer:    Wilkie Aye RVT RCS Referring Phys: [id number] [place]  1. Left ventricular ejection fraction, by estimation, is 60 to 65%. The left ventricle has normal function. The left ventricle has no regional wall motion abnormalities. There is mild left ventricular hypertrophy. Left ventricular diastolic parameters are indeterminate.  2. Right ventricular systolic function is normal. The right ventricular size is normal. There is mildly elevated pulmonary artery systolic pressure. The estimated right ventricular systolic pressure is 99991111 mmHg.  3. Left atrial size was moderately dilated.  4. A small pericardial effusion is present. The pericardial effusion is circumferential. There is no evidence of cardiac tamponade.  5. The mitral valve is normal in structure. Mild mitral valve regurgitation. No evidence of mitral stenosis.  6. Tricuspid valve regurgitation is moderate.  7. The aortic valve is calcified. There is mild calcification of the aortic valve. There is mild thickening of the aortic valve. Aortic valve regurgitation is not visualized. Mild aortic valve stenosis. Aortic valve area, by VTI measures 2.07 cm. Aortic valve mean gradient measures 10.0 mmHg. Aortic valve Vmax measures 2.16 m/s.  8. The inferior vena cava is normal in size with <50% respiratory variability, suggesting right atrial pressure of 8 mmHg. Comparison(s): No significant change from prior study. Prior images reviewed side by side. FINDINGS  Left Ventricle: Left ventricular ejection fraction, by estimation, is 60 to 65%. The left ventricle has normal function. The left ventricle has no regional wall motion abnormalities. The left ventricular internal cavity size was normal in size. There is  mild left ventricular hypertrophy. Left  ventricular diastolic parameters are indeterminate. Right Ventricle: The right ventricular size is normal. No increase in right ventricular wall thickness. Right ventricular systolic function is normal. There is mildly elevated pulmonary artery systolic pressure. The tricuspid regurgitant velocity is 2.91  m/s, and with an  assumed right atrial pressure of 8 mmHg, the estimated right ventricular systolic pressure is [id number] mmHg. Left Atrium: Left atrial size was moderately dilated. Right Atrium: Right atrial size was normal in size. Pericardium: A small pericardial effusion is present. The pericardial effusion is circumferential. There is no evidence of cardiac tamponade. Mitral Valve: The mitral valve is normal in structure. Mild mitral valve regurgitation. No evidence of mitral valve stenosis. Tricuspid Valve: The tricuspid valve is normal in structure. Tricuspid valve regurgitation is moderate . No evidence of tricuspid stenosis. Aortic Valve: The aortic valve is calcified. There is mild calcification of the aortic valve. There is mild thickening of the aortic valve. Aortic valve regurgitation is not visualized. Mild aortic stenosis is present. Aortic valve mean gradient measures  10.0 mmHg. Aortic valve peak gradient measures 18.6 mmHg. Aortic valve area, by VTI measures 2.07 cm. Pulmonic Valve: The pulmonic valve was normal in structure. Pulmonic valve regurgitation is not visualized. No evidence of pulmonic stenosis. Aorta: The aortic root is normal in size and structure. Venous: The inferior vena cava is normal in size with less than 50% respiratory variability, suggesting right atrial pressure of 8 mmHg. IAS/Shunts: No atrial level shunt detected by color flow Doppler.  LEFT VENTRICLE PLAX 2D LVIDd:         4.70 cm   Diastology LVIDs:         2.20 cm   LV e' medial:    6.90 cm/s LV PW:         1.20 cm   LV E/e' medial:  17.2 LV IVS:        0.80 cm   LV e' lateral:   7.80 cm/s LVOT diam:     1.80 cm   LV E/e'  lateral: 15.3 LV SV:         86 LV SV Index:   53 LVOT Area:     2.54 cm  RIGHT VENTRICLE             IVC RV Basal diam:  3.20 cm     IVC diam: 1.80 cm RV Mid diam:    2.50 cm RV S prime:     14.80 cm/s TAPSE (M-mode): 2.6 cm LEFT ATRIUM             Index        RIGHT ATRIUM           Index LA diam:        3.80 cm 2.35 cm/m   RA Area:     15.80 cm LA Vol (A2C):   85.8 ml 53.05 ml/m  RA Volume:   40.70 ml  25.16 ml/m LA Vol (A4C):   68.4 ml 42.32 ml/m LA Biplane Vol: 82.7 ml 51.13 ml/m  AORTIC VALVE                     PULMONIC VALVE AV Area (Vmax):    1.89 cm      PV Vmax:       1.45 m/s AV Area (Vmean):   1.98 cm      PV Peak grad:  8.4 mmHg AV Area (VTI):     2.07 cm AV Vmax:           215.50 cm/s AV Vmean:          147.500 cm/s AV VTI:            0.414 m AV Peak Grad:      18.6 mmHg  AV Mean Grad:      10.0 mmHg LVOT Vmax:         160.00 cm/s LVOT Vmean:        115.000 cm/s LVOT VTI:          0.337 m LVOT/AV VTI ratio: 0.81  AORTA Ao Root diam: 2.70 cm Ao Asc diam:  3.20 cm Ao Arch diam: 2.2 cm MITRAL VALVE                TRICUSPID VALVE MV Area (PHT): 4.80 cm     TR Peak grad:   33.9 mmHg MV Decel Time: 158 msec     TR Vmax:        291.00 cm/s MV E velocity: 119.00 cm/s MV A velocity: 90.80 cm/s   SHUNTS MV E/A ratio:  1.31         Systemic VTI:  0.34 m                             Systemic Diam: 1.80 cm Candee Furbish MD Electronically signed by Candee Furbish MD Signature Date/Time: 09/16/2022/4:41:10 PM    Final    DG Chest Portable 1 View  Result Date: 09/15/2022 CLINICAL DATA:  Shortness of breath EXAM: PORTABLE CHEST 1 VIEW COMPARISON:  12/07/2021, 12/05/2021 FINDINGS: Cardiomegaly with vascular congestion and mild interstitial pulmonary edema. Small right greater than left pleural effusions. Hazy edema or infiltrate at the right base. IMPRESSION: Cardiomegaly with vascular congestion and mild interstitial pulmonary edema. Small right greater than left pleural effusions with hazy edema or infiltrate  at the right base. Electronically Signed   By: Donavan Foil M.D.   On: 09/15/2022 23:56    Microbiology: Results for orders placed or performed during the hospital encounter of 09/15/22  Resp panel by RT-PCR (RSV, Flu A&B, Covid) Anterior Nasal Swab     Status: Abnormal   Collection Time: 09/15/22 11:51 PM   Specimen: Anterior Nasal Swab  Result Value Ref Range Status   SARS Coronavirus 2 by RT PCR POSITIVE (A) NEGATIVE Final   Influenza A by PCR NEGATIVE NEGATIVE Final   Influenza B by PCR NEGATIVE NEGATIVE Final    Comment: (NOTE) The Xpert Xpress SARS-CoV-2/FLU/RSV plus assay is intended as an aid in the diagnosis of influenza from Nasopharyngeal swab specimens and should not be used as a sole basis for treatment. Nasal washings and aspirates are unacceptable for Xpert Xpress SARS-CoV-2/FLU/RSV testing.  Fact Sheet for Patients: EntrepreneurPulse.com.au  Fact Sheet for Healthcare Providers: IncredibleEmployment.be  This test is not yet approved or cleared by the [place] FDA and has been authorized for detection and/or diagnosis of SARS-CoV-2 by FDA under an Emergency Use Authorization (EUA). This EUA will remain in effect (meaning this test can be used) for the duration of the COVID-19 declaration under Section 564(b)(1) of the Act, 21 U.S.C. section 360bbb-3(b)(1), unless the authorization is terminated or revoked.     Resp Syncytial Virus by PCR NEGATIVE NEGATIVE Final    Comment: (NOTE) Fact Sheet for Patients: EntrepreneurPulse.com.au  Fact Sheet for Healthcare Providers: IncredibleEmployment.be  This test is not yet approved or cleared by the [place] FDA and has been authorized for detection and/or diagnosis of SARS-CoV-2 by FDA under an Emergency Use Authorization (EUA). This EUA will remain in effect (meaning this test can be used) for the duration of the COVID-19 declaration  under Section 564(b)(1) of the Act, 21 U.S.C. section 360bbb-3(b)(1), unless the  authorization is terminated or revoked.  Performed at [facility], [place] [address] [place], [place] [address]   Blood Culture (routine x 2)     Status: None   Collection Time: 09/15/22 11:51 PM   Specimen: BLOOD RIGHT FOREARM  Result Value Ref Range Status   Specimen Description BLOOD RIGHT FOREARM  Final   Special Requests   Final    BOTTLES DRAWN AEROBIC AND ANAEROBIC Blood Culture adequate volume   Culture   Final    NO GROWTH 5 DAYS Performed at [facility], [place] [address], [place], [place] [address]    Report Status 09/21/2022 FINAL  Final  Blood Culture (routine x 2)     Status: None   Collection Time: 09/16/22 12:10 AM   Specimen: BLOOD  Result Value Ref Range Status   Specimen Description BLOOD SITE NOT SPECIFIED  Final   Special Requests   Final    BOTTLES DRAWN AEROBIC AND ANAEROBIC Blood Culture results may not be optimal due to an excessive volume of blood received in culture bottles   Culture   Final    NO GROWTH 5 DAYS Performed at [facility], [place] [address], [place], [place] [address]    Report Status 09/21/2022 FINAL  Final  Resp panel by RT-PCR (RSV, Flu A&B, Covid) Anterior Nasal Swab     Status: Abnormal   Collection Time: 09/21/22 12:49 PM   Specimen: Anterior Nasal Swab  Result Value Ref Range Status   SARS Coronavirus 2 by RT PCR POSITIVE (A) NEGATIVE Final   Influenza A by PCR NEGATIVE NEGATIVE Final   Influenza B by PCR NEGATIVE NEGATIVE Final    Comment: (NOTE) The Xpert Xpress SARS-CoV-2/FLU/RSV plus assay is intended as an aid in the diagnosis of influenza from Nasopharyngeal swab specimens and should not be used as a sole basis for treatment. Nasal washings and aspirates are unacceptable for Xpert Xpress SARS-CoV-2/FLU/RSV testing.  Fact Sheet for Patients: EntrepreneurPulse.com.au  Fact Sheet for Healthcare  Providers: IncredibleEmployment.be  This test is not yet approved or cleared by the [place] FDA and has been authorized for detection and/or diagnosis of SARS-CoV-2 by FDA under an Emergency Use Authorization (EUA). This EUA will remain in effect (meaning this test can be used) for the duration of the COVID-19 declaration under Section 564(b)(1) of the Act, 21 U.S.C. section 360bbb-3(b)(1), unless the authorization is terminated or revoked.     Resp Syncytial Virus by PCR NEGATIVE NEGATIVE Final    Comment: (NOTE) Fact Sheet for Patients: EntrepreneurPulse.com.au  Fact Sheet for Healthcare Providers: IncredibleEmployment.be  This test is not yet approved or cleared by the [place] FDA and has been authorized for detection and/or diagnosis of SARS-CoV-2 by FDA under an Emergency Use Authorization (EUA). This EUA will remain in effect (meaning this test can be used) for the duration of the COVID-19 declaration under Section 564(b)(1) of the Act, 21 U.S.C. section 360bbb-3(b)(1), unless the authorization is terminated or revoked.  Performed at [facility], [place] [address] [place],  [address]    Labs: CBC: Recent Labs  Lab 09/15/22 2351 09/16/22 0515 09/17/22 0451 09/18/22 0857 09/20/22 1501 09/21/22 0434  WBC 2.6* 2.4* 4.1 5.9 5.6 6.1  NEUTROABS 1.5*  --   --   --   --  4.4  HGB 9.2* 9.4* 9.7* 10.0* 10.8* 10.7*  HCT 26.9* 27.1* 29.2* 29.1* 32.0* 31.3*  MCV 103.5* 103.4* 104.7* 104.7* 102.9* 103.3*  PLT 41*  40* 55* 65* 79* 79*   Basic Metabolic Panel: Recent Labs  Lab 09/16/22 0515 09/17/22 0451 09/18/22 0325 09/20/22 1501 09/21/22 0434  NA 129* 134* 132* 134* 137  K 4.2 4.3 4.6 3.5 3.7  CL 92* 99 97* 96* 99  CO2 24 20* 19* 25 27  GLUCOSE 97 145* 123* 298* 135*  BUN 16 39* 62* 21 35*  CREATININE 3.50* 5.03* 6.53* 3.09* 4.25*  CALCIUM 8.1* 8.7* 8.5* 8.7* 9.2  MG  --   --   --   --   2.2  PHOS  --  6.8*  --  1.6*  --    Liver Function Tests: Recent Labs  Lab 09/15/22 2351 09/16/22 0515 09/17/22 0451 09/18/22 0325 09/20/22 1501 09/21/22 0434  AST 28 27  --  32  --  110*  ALT 16 14  --  20  --  70*  ALKPHOS 100 86  --  103  --  97  BILITOT 0.6 1.0  --  0.5  --  0.7  PROT 7.3 7.1  --  7.4  --  7.8  ALBUMIN 3.2* 3.1* 3.1* 3.1* 3.3* 3.1*   CBG: Recent Labs  Lab 09/19/22 2248  GLUCAP 152*    Discharge time spent: greater than 30 minutes.  Signed: Berle Mull, MD [facility]

## 2022-10-03 ENCOUNTER — Telehealth: Payer: Self-pay | Admitting: Internal Medicine

## 2022-10-03 ENCOUNTER — Ambulatory Visit: Payer: 59 | Admitting: Emergency Medicine

## 2022-10-03 NOTE — Telephone Encounter (Signed)
scheduled per referral , pt has been called via interpreter and confirmed date and time. Pt is aware of location and to arrive early for check in

## 2022-10-11 ENCOUNTER — Ambulatory Visit: Payer: 59 | Admitting: Emergency Medicine

## 2022-10-20 ENCOUNTER — Emergency Department (HOSPITAL_COMMUNITY): Payer: 59

## 2022-10-20 ENCOUNTER — Other Ambulatory Visit: Payer: Self-pay

## 2022-10-20 ENCOUNTER — Inpatient Hospital Stay (HOSPITAL_COMMUNITY)
Admission: EM | Admit: 2022-10-20 | Discharge: 2022-10-27 | DRG: 640 | Disposition: A | Discharge status: Home / Self Care | Payer: 59 | Source: Ambulatory Visit | Attending: Family Medicine | Admitting: Family Medicine

## 2022-10-20 ENCOUNTER — Inpatient Hospital Stay (HOSPITAL_COMMUNITY): Payer: 59

## 2022-10-20 DIAGNOSIS — I442 Atrioventricular block, complete: Secondary | ICD-10-CM | POA: Diagnosis present

## 2022-10-20 DIAGNOSIS — I132 Hypertensive heart and chronic kidney disease with heart failure and with stage 5 chronic kidney disease, or end stage renal disease: Secondary | ICD-10-CM | POA: Diagnosis present

## 2022-10-20 DIAGNOSIS — K7030 Alcoholic cirrhosis of liver without ascites: Secondary | ICD-10-CM | POA: Diagnosis present

## 2022-10-20 DIAGNOSIS — Z992 Dependence on renal dialysis: Secondary | ICD-10-CM

## 2022-10-20 DIAGNOSIS — Z8616 Personal history of COVID-19: Secondary | ICD-10-CM

## 2022-10-20 DIAGNOSIS — D631 Anemia in chronic kidney disease: Secondary | ICD-10-CM | POA: Diagnosis present

## 2022-10-20 DIAGNOSIS — J9601 Acute respiratory failure with hypoxia: Secondary | ICD-10-CM | POA: Diagnosis present

## 2022-10-20 DIAGNOSIS — D509 Iron deficiency anemia, unspecified: Secondary | ICD-10-CM | POA: Diagnosis present

## 2022-10-20 DIAGNOSIS — J9602 Acute respiratory failure with hypercapnia: Secondary | ICD-10-CM | POA: Diagnosis present

## 2022-10-20 DIAGNOSIS — Z79890 Hormone replacement therapy (postmenopausal): Secondary | ICD-10-CM

## 2022-10-20 DIAGNOSIS — Z833 Family history of diabetes mellitus: Secondary | ICD-10-CM

## 2022-10-20 DIAGNOSIS — K219 Gastro-esophageal reflux disease without esophagitis: Secondary | ICD-10-CM | POA: Diagnosis present

## 2022-10-20 DIAGNOSIS — E875 Hyperkalemia: Secondary | ICD-10-CM | POA: Diagnosis present

## 2022-10-20 DIAGNOSIS — N185 Chronic kidney disease, stage 5: Secondary | ICD-10-CM | POA: Diagnosis not present

## 2022-10-20 DIAGNOSIS — S301XXA Contusion of abdominal wall, initial encounter: Secondary | ICD-10-CM | POA: Diagnosis not present

## 2022-10-20 DIAGNOSIS — E039 Hypothyroidism, unspecified: Secondary | ICD-10-CM | POA: Diagnosis present

## 2022-10-20 DIAGNOSIS — R569 Unspecified convulsions: Secondary | ICD-10-CM | POA: Diagnosis not present

## 2022-10-20 DIAGNOSIS — I5032 Chronic diastolic (congestive) heart failure: Secondary | ICD-10-CM | POA: Diagnosis present

## 2022-10-20 DIAGNOSIS — Z79899 Other long term (current) drug therapy: Secondary | ICD-10-CM

## 2022-10-20 DIAGNOSIS — Z8711 Personal history of peptic ulcer disease: Secondary | ICD-10-CM | POA: Diagnosis not present

## 2022-10-20 DIAGNOSIS — J690 Pneumonitis due to inhalation of food and vomit: Secondary | ICD-10-CM | POA: Diagnosis present

## 2022-10-20 DIAGNOSIS — N186 End stage renal disease: Secondary | ICD-10-CM | POA: Diagnosis present

## 2022-10-20 DIAGNOSIS — R55 Syncope and collapse: Secondary | ICD-10-CM | POA: Diagnosis present

## 2022-10-20 DIAGNOSIS — R001 Bradycardia, unspecified: Secondary | ICD-10-CM | POA: Diagnosis present

## 2022-10-20 DIAGNOSIS — R579 Shock, unspecified: Secondary | ICD-10-CM | POA: Diagnosis present

## 2022-10-20 DIAGNOSIS — D696 Thrombocytopenia, unspecified: Secondary | ICD-10-CM | POA: Diagnosis present

## 2022-10-20 DIAGNOSIS — Y848 Other medical procedures as the cause of abnormal reaction of the patient, or of later complication, without mention of misadventure at the time of the procedure: Secondary | ICD-10-CM | POA: Diagnosis not present

## 2022-10-20 DIAGNOSIS — E1122 Type 2 diabetes mellitus with diabetic chronic kidney disease: Secondary | ICD-10-CM | POA: Diagnosis present

## 2022-10-20 DIAGNOSIS — I3139 Other pericardial effusion (noninflammatory): Secondary | ICD-10-CM | POA: Diagnosis present

## 2022-10-20 DIAGNOSIS — G8929 Other chronic pain: Secondary | ICD-10-CM | POA: Diagnosis present

## 2022-10-20 DIAGNOSIS — E8889 Other specified metabolic disorders: Secondary | ICD-10-CM | POA: Diagnosis present

## 2022-10-20 DIAGNOSIS — Y718 Miscellaneous cardiovascular devices associated with adverse incidents, not elsewhere classified: Secondary | ICD-10-CM | POA: Diagnosis not present

## 2022-10-20 LAB — POCT I-STAT 7, (LYTES, BLD GAS, ICA,H+H)
Acid-base deficit: 6 mmol/L — ABNORMAL HIGH (ref 0.0–2.0)
Acid-base deficit: 4 mmol/L — ABNORMAL HIGH (ref 0.0–2.0)
Bicarbonate: 21.4 mmol/L (ref 20.0–28.0)
Bicarbonate: 23.7 mmol/L (ref 20.0–28.0)
Calcium, Ion: 1.1 mmol/L — ABNORMAL LOW (ref 1.15–1.40)
Calcium, Ion: 1.07 mmol/L — ABNORMAL LOW (ref 1.15–1.40)
HCT: 31 % — ABNORMAL LOW (ref 36.0–46.0)
HCT: 32 % — ABNORMAL LOW (ref 36.0–46.0)
Hemoglobin: 10.5 g/dL — ABNORMAL LOW (ref 12.0–15.0)
Hemoglobin: 10.9 g/dL — ABNORMAL LOW (ref 12.0–15.0)
O2 Saturation: 100 %
O2 Saturation: 99 %
Patient temperature: 36.5
Patient temperature: 36.5
Potassium: 4.7 mmol/L (ref 3.5–5.1)
Potassium: 5.7 mmol/L — ABNORMAL HIGH (ref 3.5–5.1)
Sodium: 132 mmol/L — ABNORMAL LOW (ref 135–145)
Sodium: 132 mmol/L — ABNORMAL LOW (ref 135–145)
TCO2: 23 mmol/L (ref 22–32)
TCO2: 25 mmol/L (ref 22–32)
pCO2 arterial: 51.8 mmHg — ABNORMAL HIGH (ref 32–48)
pCO2 arterial: 50.7 mmHg — ABNORMAL HIGH (ref 32–48)
pH, Arterial: 7.222 — ABNORMAL LOW (ref 7.35–7.45)
pH, Arterial: 7.275 — ABNORMAL LOW (ref 7.35–7.45)
pO2, Arterial: 257 mmHg — ABNORMAL HIGH (ref 83–108)
pO2, Arterial: 168 mmHg — ABNORMAL HIGH (ref 83–108)

## 2022-10-20 LAB — GLUCOSE, CAPILLARY
Glucose-Capillary: 102 mg/dL — ABNORMAL HIGH (ref 70–99)
Glucose-Capillary: 105 mg/dL — ABNORMAL HIGH (ref 70–99)
Glucose-Capillary: 115 mg/dL — ABNORMAL HIGH (ref 70–99)

## 2022-10-20 LAB — I-STAT CHEM 8, ED
BUN: 31 mg/dL — ABNORMAL HIGH (ref 8–23)
Calcium, Ion: 0.96 mmol/L — ABNORMAL LOW (ref 1.15–1.40)
Chloride: 101 mmol/L (ref 98–111)
Creatinine, Ser: 5.6 mg/dL — ABNORMAL HIGH (ref 0.44–1.00)
Glucose, Bld: 169 mg/dL — ABNORMAL HIGH (ref 70–99)
HCT: 34 % — ABNORMAL LOW (ref 36.0–46.0)
Hemoglobin: 11.6 g/dL — ABNORMAL LOW (ref 12.0–15.0)
Potassium: 6.3 mmol/L (ref 3.5–5.1)
Sodium: 129 mmol/L — ABNORMAL LOW (ref 135–145)
TCO2: 18 mmol/L — ABNORMAL LOW (ref 22–32)

## 2022-10-20 LAB — CBC
HCT: 33.9 % — ABNORMAL LOW (ref 36.0–46.0)
Hemoglobin: 11.3 g/dL — ABNORMAL LOW (ref 12.0–15.0)
MCH: 35.4 pg — ABNORMAL HIGH (ref 26.0–34.0)
MCHC: 33.3 g/dL (ref 30.0–36.0)
MCV: 106.3 fL — ABNORMAL HIGH (ref 80.0–100.0)
Platelets: 91 10*3/uL — ABNORMAL LOW (ref 150–400)
RBC: 3.19 MIL/uL — ABNORMAL LOW (ref 3.87–5.11)
RDW: 15 % (ref 11.5–15.5)
WBC: 7 10*3/uL (ref 4.0–10.5)
nRBC: 0 % (ref 0.0–0.2)

## 2022-10-20 LAB — CBG MONITORING, ED
Glucose-Capillary: 169 mg/dL — ABNORMAL HIGH (ref 70–99)
Glucose-Capillary: 364 mg/dL — ABNORMAL HIGH (ref 70–99)

## 2022-10-20 LAB — COMPREHENSIVE METABOLIC PANEL
ALT: 22 U/L (ref 0–44)
AST: 43 U/L — ABNORMAL HIGH (ref 15–41)
Albumin: 3.6 g/dL (ref 3.5–5.0)
Alkaline Phosphatase: 118 U/L (ref 38–126)
Anion gap: 15 (ref 5–15)
BUN: 10 mg/dL (ref 8–23)
CO2: 25 mmol/L (ref 22–32)
Calcium: 8.2 mg/dL — ABNORMAL LOW (ref 8.9–10.3)
Chloride: 91 mmol/L — ABNORMAL LOW (ref 98–111)
Creatinine, Ser: 1.99 mg/dL — ABNORMAL HIGH (ref 0.44–1.00)
GFR, Estimated: 27 mL/min — ABNORMAL LOW (ref >60)
Glucose, Bld: 129 mg/dL — ABNORMAL HIGH (ref 70–99)
Potassium: 3.3 mmol/L — ABNORMAL LOW (ref 3.5–5.1)
Sodium: 131 mmol/L — ABNORMAL LOW (ref 135–145)
Total Bilirubin: 1.2 mg/dL (ref 0.3–1.2)
Total Protein: 8.8 g/dL — ABNORMAL HIGH (ref 6.5–8.1)

## 2022-10-20 LAB — MAGNESIUM: Magnesium: 2.7 mg/dL — ABNORMAL HIGH (ref 1.7–2.4)

## 2022-10-20 LAB — PHOSPHORUS: Phosphorus: 4.3 mg/dL (ref 2.5–4.6)

## 2022-10-20 LAB — TROPONIN I (HIGH SENSITIVITY)
Troponin I (High Sensitivity): 13 ng/L (ref <18)
Troponin I (High Sensitivity): 14 ng/L (ref <18)

## 2022-10-20 LAB — BASIC METABOLIC PANEL
Anion gap: 18 — ABNORMAL HIGH (ref 5–15)
BUN: 30 mg/dL — ABNORMAL HIGH (ref 8–23)
CO2: 18 mmol/L — ABNORMAL LOW (ref 22–32)
Calcium: 8.2 mg/dL — ABNORMAL LOW (ref 8.9–10.3)
Chloride: 94 mmol/L — ABNORMAL LOW (ref 98–111)
Creatinine, Ser: 4.87 mg/dL — ABNORMAL HIGH (ref 0.44–1.00)
GFR, Estimated: 9 mL/min — ABNORMAL LOW (ref >60)
Glucose, Bld: 176 mg/dL — ABNORMAL HIGH (ref 70–99)
Potassium: 6.2 mmol/L — ABNORMAL HIGH (ref 3.5–5.1)
Sodium: 130 mmol/L — ABNORMAL LOW (ref 135–145)

## 2022-10-20 LAB — I-STAT ARTERIAL BLOOD GAS, ED
Acid-base deficit: 9 mmol/L — ABNORMAL HIGH (ref 0.0–2.0)
Bicarbonate: 22.4 mmol/L (ref 20.0–28.0)
Calcium, Ion: 1.11 mmol/L — ABNORMAL LOW (ref 1.15–1.40)
HCT: 34 % — ABNORMAL LOW (ref 36.0–46.0)
Hemoglobin: 11.6 g/dL — ABNORMAL LOW (ref 12.0–15.0)
O2 Saturation: 100 %
Potassium: 6.1 mmol/L — ABNORMAL HIGH (ref 3.5–5.1)
Sodium: 127 mmol/L — ABNORMAL LOW (ref 135–145)
TCO2: 25 mmol/L (ref 22–32)
pCO2 arterial: 80.7 mmHg (ref 32–48)
pH, Arterial: 7.051 — CL (ref 7.35–7.45)
pO2, Arterial: 266 mmHg — ABNORMAL HIGH (ref 83–108)

## 2022-10-20 LAB — MRSA NEXT GEN BY PCR, NASAL: MRSA by PCR Next Gen: NOT DETECTED

## 2022-10-20 LAB — HEPATITIS B SURFACE ANTIGEN: Hepatitis B Surface Ag: NONREACTIVE

## 2022-10-20 MED ORDER — DOCUSATE SODIUM 50 MG/5ML PO LIQD: 100.0000 mg | Freq: Two times a day (BID) | ORAL | Status: DC | PRN

## 2022-10-20 MED ORDER — POLYETHYLENE GLYCOL 3350 17 G PO PACK: 17.0000 g | PACK | Freq: Every day | ORAL | Status: DC | PRN

## 2022-10-20 MED ORDER — FENTANYL CITRATE PF 50 MCG/ML IJ SOSY
25.0000 ug | PREFILLED_SYRINGE | Freq: Once | INTRAMUSCULAR | Status: AC
  Administered 2022-10-20: 25 ug via INTRAVENOUS
  Filled 2022-10-20: qty 1

## 2022-10-20 MED ORDER — DEXTROSE 50 % IV SOLN
1.0000 | Freq: Once | INTRAVENOUS | Status: AC
  Administered 2022-10-20: 50 mL via INTRAVENOUS
  Filled 2022-10-20: qty 50

## 2022-10-20 MED ORDER — ALBUTEROL SULFATE (2.5 MG/3ML) 0.083% IN NEBU
10.0000 mg | INHALATION_SOLUTION | Freq: Once | RESPIRATORY_TRACT | Status: AC
  Administered 2022-10-20: 10 mg via RESPIRATORY_TRACT
  Filled 2022-10-20: qty 12

## 2022-10-20 MED ORDER — NOREPINEPHRINE 4 MG/250ML-% IV SOLN
0.0000 ug/min | INTRAVENOUS | Status: DC
  Administered 2022-10-20: 25 ug/min via INTRAVENOUS
  Administered 2022-10-20: 12 ug/min via INTRAVENOUS
  Filled 2022-10-20 (×3): qty 250

## 2022-10-20 MED ORDER — LORAZEPAM 2 MG/ML IJ SOLN: 2.0000 mg | INTRAMUSCULAR | Status: DC | PRN

## 2022-10-20 MED ORDER — INSULIN ASPART 100 UNIT/ML IJ SOLN
0.0000 [IU] | INTRAMUSCULAR | Status: DC
  Administered 2022-10-21: 1 [IU] via SUBCUTANEOUS
  Administered 2022-10-21: 3 [IU] via SUBCUTANEOUS
  Administered 2022-10-22: 1 [IU] via SUBCUTANEOUS

## 2022-10-20 MED ORDER — FENTANYL BOLUS VIA INFUSION
25.0000 ug | INTRAVENOUS | Status: DC | PRN
  Administered 2022-10-20 (×2): 50 ug via INTRAVENOUS
  Administered 2022-10-20: 100 ug via INTRAVENOUS
  Administered 2022-10-20: 25 ug via INTRAVENOUS

## 2022-10-20 MED ORDER — CHLORHEXIDINE GLUCONATE CLOTH 2 % EX PADS
6.0000 | MEDICATED_PAD | Freq: Every day | CUTANEOUS | Status: DC
  Administered 2022-10-20 – 2022-10-22 (×3): 6 via TOPICAL

## 2022-10-20 MED ORDER — CALCIUM GLUCONATE 10 % IV SOLN
1.0000 g | Freq: Once | INTRAVENOUS | Status: AC
  Administered 2022-10-20: 1 g via INTRAVENOUS
  Filled 2022-10-20: qty 10

## 2022-10-20 MED ORDER — FAMOTIDINE 20 MG PO TABS
20.0000 mg | ORAL_TABLET | Freq: Two times a day (BID) | ORAL | Status: DC
  Administered 2022-10-20: 20 mg

## 2022-10-20 MED ORDER — FENTANYL 2500MCG IN NS 250ML (10MCG/ML) PREMIX INFUSION
25.0000 ug/h | INTRAVENOUS | Status: DC
  Administered 2022-10-20: 100 ug/h via INTRAVENOUS
  Administered 2022-10-20: 25 ug/h via INTRAVENOUS
  Filled 2022-10-20 (×2): qty 250

## 2022-10-20 MED ORDER — FAMOTIDINE 20 MG PO TABS
20.0000 mg | ORAL_TABLET | Freq: Every day | ORAL | Status: DC
  Administered 2022-10-21: 20 mg
  Filled 2022-10-20 (×2): qty 1

## 2022-10-20 MED ORDER — PROPOFOL 1000 MG/100ML IV EMUL
0.0000 ug/kg/min | INTRAVENOUS | Status: DC
  Administered 2022-10-20: 25 ug/kg/min via INTRAVENOUS
  Administered 2022-10-20: 20 ug/kg/min via INTRAVENOUS
  Filled 2022-10-20 (×3): qty 100

## 2022-10-20 MED ORDER — ORAL CARE MOUTH RINSE
15.0000 mL | OROMUCOSAL | Status: DC
  Administered 2022-10-20 – 2022-10-21 (×5): 15 mL via OROMUCOSAL

## 2022-10-20 MED ORDER — NOREPINEPHRINE 16 MG/250ML-% IV SOLN
0.0000 ug/min | INTRAVENOUS | Status: DC
  Administered 2022-10-20: 12 ug/min via INTRAVENOUS
  Filled 2022-10-20: qty 250

## 2022-10-20 MED ORDER — INSULIN ASPART 100 UNIT/ML IV SOLN
5.0000 [IU] | Freq: Once | INTRAVENOUS | Status: AC
  Administered 2022-10-20: 5 [IU] via INTRAVENOUS

## 2022-10-20 MED ORDER — SODIUM BICARBONATE 8.4 % IV SOLN
50.0000 meq | Freq: Once | INTRAVENOUS | Status: AC
  Administered 2022-10-20: 50 meq via INTRAVENOUS
  Filled 2022-10-20: qty 50

## 2022-10-20 MED ORDER — FENTANYL CITRATE PF 50 MCG/ML IJ SOSY
50.0000 ug | PREFILLED_SYRINGE | Freq: Once | INTRAMUSCULAR | Status: AC
  Administered 2022-10-20: 50 ug via INTRAVENOUS
  Filled 2022-10-20: qty 1

## 2022-10-20 MED ORDER — DOPAMINE-DEXTROSE 3.2-5 MG/ML-% IV SOLN
0.0000 ug/kg/min | INTRAVENOUS | Status: DC
  Administered 2022-10-20: 5 ug/kg/min via INTRAVENOUS

## 2022-10-20 MED ORDER — DOCUSATE SODIUM 100 MG PO CAPS: 100.0000 mg | ORAL_CAPSULE | Freq: Two times a day (BID) | ORAL | Status: DC | PRN

## 2022-10-20 MED ORDER — ORAL CARE MOUTH RINSE: 15.0000 mL | OROMUCOSAL | Status: DC | PRN

## 2022-10-20 MED ORDER — MIDAZOLAM HCL 2 MG/2ML IJ SOLN: 1.0000 mg | INTRAMUSCULAR | Status: DC | PRN

## 2022-10-20 NOTE — Procedures (Signed)
Central Venous Catheter Insertion Procedure Note  Stacey Chambers  [id number]  04-24-1951  Date:10/20/22  Time:4:06 PM   Provider Performing:Lajuana Patchell E Antonia Culbertson   Procedure: Insertion of Non-tunneled Central Venous [phone]) with US guidance ([address]213)   Indication(s) Medication administration  Consent Unable to obtain consent due to inability to find a medical decision maker for patient.  All reasonable efforts were made.  Another independent medical provider, Dr. [name] , confirmed the benefits of this procedure outweigh the risks.  Anesthesia Topical only with 1% lidocaine   Timeout Verified patient identification, verified procedure, site/side was marked, verified correct patient position, special equipment/implants available, medications/allergies/relevant history reviewed, required imaging and test results available.  Sterile Technique Maximal sterile technique including full sterile barrier drape, hand hygiene, sterile gown, sterile gloves, mask, hair covering, sterile ultrasound probe cover (if used).  Procedure Description Area of catheter insertion was cleaned with chlorhexidine and draped in sterile fashion.  With real-time ultrasound guidance a central venous catheter was placed into the right femoral vein. Positioning of guidewire within the right femoral vein was confirmed with ultrasound prior to dilation of vessel and placement of central venous catheter. Nonpulsatile blood flow and easy flushing noted in all ports.  The catheter was sutured in place and sterile dressing applied.  Complications/Tolerance None; patient tolerated the procedure well.   EBL Minimal  Specimen(s) None   Stacey Fass MSN, AGACNP-BC [facility] Pulmonary/Critical Care Medicine 10/20/2022, 4:07 PM

## 2022-10-20 NOTE — Consult Note (Addendum)
CARDIOLOGY CONSULT NOTE  Patient ID: Stacey Chambers MRN: [id number] DOB/AGE: March 26, 1951 72 y.o.  Admit date: 10/20/2022 Referring Physician  Melody Comas, MD Primary Physician:  Georgina Quint, MD Reason for Consultation  sinus arrest  Patient ID: Stacey Chambers, female    DOB: [birth year], 73 y.o.   MRN: [id number]  Chief Complaint  Patient presents with   Chest Pain   HPI:    Stacey Chambers  is a 72 y.o. female with PMH of ESRD on HD MWF, type II DM, hypothyroidism, prior HTN, alcoholic cirrhosis of the liver, chronic thrombocytopenia admitted to the hospital via EMS, while at the dialysis center, patient became unresponsive and had a seizure episode, EMS was activated.  She was found to be markedly bradycardic with heart rate in the 20s to 30s and also was found to have 22nd atrial standstill and ventricular pauses per EMS and received atropine.  She has been turned down for kidney transplant.  Upon arrival to the emergency room, she was started on external transcutaneous pacemaker and I was consulted stat for evaluation and possible need for temporary transvenous pacemaker.  Patient was also intubated electively prior to my arrival.  History obtained by chart review.  Patient is presently intubated and sedated.  Past Medical History:  Diagnosis Date   Anemia    Diabetes mellitus without complication (HCC)    Hypertension    Vision loss    Past Surgical History:  Procedure Laterality Date   AV FISTULA PLACEMENT Left 12/23/2021   Procedure: INSERTION OF LEFT ARM BRACHIOCEPHALIC ARTERIOVENOUS FISTULA;  Surgeon: Cephus Shelling, MD;  Location: [facility] OR;  Service: Vascular;  Laterality: Left;   BACK SURGERY     COLONOSCOPY     IR FLUORO GUIDE CV LINE RIGHT  12/16/2021   IR US GUIDE VASC ACCESS RIGHT  12/16/2021   UPPER GASTROINTESTINAL ENDOSCOPY     Social History   Tobacco Use   Smoking status: Never   Smokeless tobacco: Never  Substance  Use Topics   Alcohol use: Never    Comment: once in awhile    Family History  Problem Relation Age of Onset   Diabetes Brother    Colon cancer Neg Hx    Esophageal cancer Neg Hx    Stomach cancer Neg Hx    Pancreatic cancer Neg Hx    Inflammatory bowel disease Neg Hx    Liver disease Neg Hx    Rectal cancer Neg Hx    Colon polyps Neg Hx     Marital Status: Married  ROS  Review of Systems  Unable to perform ROS: Intubated   Objective      10/20/2022    3:00 PM 10/20/2022    2:45 PM 10/20/2022    2:30 PM  Vitals with BMI  Systolic 100 101 782  Diastolic 47 48 49  Pulse 118 116 112    Blood pressure (!) 100/47, pulse (!) 118, temperature 97.7 F (36.5 C), temperature source Axillary, resp. rate (!) 23, height 5' (1.524 m), weight 62.6 kg, SpO2 100 %.   Physical Exam Constitutional:      Appearance: Normal appearance.     Interventions: She is intubated.  Neck:     Vascular: No carotid bruit or JVD.  Cardiovascular:     Rate and Rhythm: Regular rhythm. Bradycardia present.     Pulses:          Dorsalis pedis pulses are 0 on the right side and 0  on the left side.       Posterior tibial pulses are 0 on the right side and 0 on the left side.     Heart sounds: Heart sounds are distant. No murmur heard.    No gallop.     Comments: Left upper extremity AV fistula for dialysis noted Pulmonary:     Effort: Pulmonary effort is normal. She is intubated.     Breath sounds: Normal breath sounds.  Abdominal:     General: Bowel sounds are normal.     Palpations: Abdomen is soft.  Musculoskeletal:     Right lower leg: No edema.     Left lower leg: No edema.    Laboratory examination:   Recent Labs    09/20/22 1501 09/21/22 0434 10/20/22 0752 10/20/22 0755 10/20/22 0939 10/20/22 1217 10/20/22 1336  NA 134* 137 130* 129* 127* 132* 132*  K 3.5 3.7 6.2* 6.3* 6.1* 4.7 5.7*  CL 96* 99 94* 101  --   --   --   CO2 25 27 18*  --   --   --   --   GLUCOSE 298* 135* 176*  169*  --   --   --   BUN 21 35* 30* 31*  --   --   --   CREATININE 3.09* 4.25* 4.87* 5.60*  --   --   --   CALCIUM 8.7* 9.2 8.2*  --   --   --   --   GFRNONAA 16* 11* 9*  --   --   --   --    estimated creatinine clearance is 7.8 mL/min (A) (by C-G formula based on SCr of 5.6 mg/dL (H)).     Latest Ref Rng & Units 10/20/2022    1:36 PM 10/20/2022   12:17 PM 10/20/2022    9:39 AM  CMP  Sodium 135 - 145 mmol/L 132  132  127   Potassium 3.5 - 5.1 mmol/L 5.7  4.7  6.1       Latest Ref Rng & Units 10/20/2022    1:36 PM 10/20/2022   12:17 PM 10/20/2022    9:39 AM  CBC  Hemoglobin 12.0 - 15.0 g/dL 40.9  81.1  91.4   Hematocrit 36.0 - 46.0 % 32.0  31.0  34.0    Lipid Panel Recent Labs    03/30/22 1327  CHOL 144  TRIG 106.0  LDLCALC 62  VLDL 21.2  HDL 61.60  CHOLHDL 2    HEMOGLOBIN A1C Lab Results  Component Value Date   HGBA1C 4.7 07/12/2022   MPG 93.93 12/02/2021   TSH Recent Labs    12/02/21 0458 07/07/22 0955  TSH 2.793 4.07   BNP (last 3 results) Recent Labs    12/01/21 1212 09/15/22 2351  BNP 715.8* 1,140.0*   Cardiac Panel (last 3 results) Recent Labs    10/20/22 0752 10/20/22 1008  TROPONINIHS 13 14     Medications and allergies  No Known Allergies   No outpatient medications have been marked as taking for the 10/20/22 encounter [facility] Encounter).    Scheduled Meds:  Chlorhexidine Gluconate Cloth  6 each Topical Q0600   famotidine  20 mg Per Tube BID   insulin aspart  0-9 Units Subcutaneous Q4H   Continuous Infusions:  DOPamine 10 mcg/kg/min (10/20/22 1500)   fentaNYL infusion INTRAVENOUS 200 mcg/hr (10/20/22 1500)   norepinephrine (LEVOPHED) Adult infusion 12 mcg/min (10/20/22 1500)   propofol (DIPRIVAN) infusion 25 mcg/kg/min (10/20/22 1500)  PRN Meds:.docusate, fentaNYL, LORazepam, midazolam, polyethylene glycol   No intake/output data recorded. Total I/O In: 304.8 [I.V.:304.8] Out: -   Net IO Since Admission: 304.81 mL [10/20/22  1502]   Radiology:   CT HEAD WO CONTRAST ( )  Result Date: 10/20/2022 CLINICAL DATA:  New onset seizure.  No trauma. EXAM: CT HEAD WITHOUT CONTRAST TECHNIQUE: Contiguous axial images were obtained from the base of the skull through the vertex without intravenous contrast. RADIATION DOSE REDUCTION: This exam was performed according to the departmental dose-optimization program which includes automated exposure control, adjustment of the mA and/or kV according to patient size and/or use of iterative reconstruction technique. COMPARISON:  None Available. FINDINGS: Mild motion artifact. Brain: Ventricles, cisterns and other CSF spaces are normal. There is no mass, mass effect, shift of midline structures or acute hemorrhage. No evidence of acute infarction. It would be difficult to exclude small acute subarachnoid hemorrhage due to the motion artifact. Vascular: No hyperdense vessel or unexpected calcification. Skull: Normal. Negative for fracture or focal lesion. Sinuses/Orbits: No acute finding. Other: None. IMPRESSION: No acute findings. Electronically Signed   By: Elberta Fortis M.D.   On: 10/20/2022 12:14   DG Chest Portable 1 View  Result Date: 10/20/2022 CLINICAL DATA:  NG/OG tube placement. EXAM: PORTABLE CHEST 1 VIEW Abdomen one view COMPARISON:  None Available. FINDINGS: ETT tip is 3 cm from the carina. Cardiomegaly. Diffuse interstitial opacities. Small right pleural effusion. No evidence of pneumothorax. NG/OG tube tip and side port are in the stomach. Nonobstructive bowel-gas pattern. Orthopedic hardware of the lumbar spine. IMPRESSION: 1. ETT tip is 3 cm from the carina. 2. NG/OG tube tip and side port are in the stomach. Electronically Signed   By: Allegra Lai M.D.   On: 10/20/2022 08:27   DG Abdomen 1 View  Result Date: 10/20/2022 CLINICAL DATA:  NG/OG tube placement. EXAM: PORTABLE CHEST 1 VIEW Abdomen one view COMPARISON:  None Available. FINDINGS: ETT tip is 3 cm from the carina.  Cardiomegaly. Diffuse interstitial opacities. Small right pleural effusion. No evidence of pneumothorax. NG/OG tube tip and side port are in the stomach. Nonobstructive bowel-gas pattern. Orthopedic hardware of the lumbar spine. IMPRESSION: 1. ETT tip is 3 cm from the carina. 2. NG/OG tube tip and side port are in the stomach. Electronically Signed   By: Allegra Lai M.D.   On: 10/20/2022 08:27    Cardiac Studies:   Echocardiogram 09/16/2022:  1. Left ventricular ejection fraction, by estimation, is 60 to 65%. The left ventricle has normal function. The left ventricle has no regional wall motion abnormalities. There is mild left ventricular hypertrophy. Left ventricular diastolic parameters  are indeterminate.  2. Right ventricular systolic function is normal. The right ventricular size is normal. There is mildly elevated pulmonary artery systolic pressure. The estimated right ventricular systolic pressure is 41.9 mmHg.  3. Left atrial size was moderately dilated.  4. A small pericardial effusion is present. The pericardial effusion is circumferential. There is no evidence of cardiac tamponade.  5. The mitral valve is normal in structure. Mild mitral valve regurgitation. No evidence of mitral stenosis.  6. Tricuspid valve regurgitation is moderate.  7. The aortic valve is calcified. There is mild calcification of the aortic valve. There is mild thickening of the aortic valve. Aortic valve regurgitation is not visualized. Mild aortic valve stenosis. Aortic valve area, by VTI measures 2.07 cm.  Aortic valve mean gradient measures 10.0 mmHg. Aortic valve Vmax measures 2.16 m/s.  8. The inferior  vena cava is normal in size with <50% respiratory variability, suggesting right atrial pressure of 8 mmHg.  EKG:  EKG 10/20/2022: Underlying sinus tachycardia at rate of 100 bpm, complete heart block with junctional escape at the rate of 33 bpm.  Right bundle branch block.  No evidence of ischemia.   Hyperacute T wave suggest hyperkalemia.  Assessment   1.  Complete heart block 2.  Hyperkalemia 3.  End-stage renal disease on hemodialysis 4.  Chronic diastolic heart failure  Recommendations:   female with PMH of ESRD on HD MWF, type II DM, hypothyroidism, prior HTN, alcoholic cirrhosis of the liver, chronic thrombocytopenia admitted to the hospital via EMS, while at the dialysis center, patient became unresponsive and had a seizure episode, EMS was activated.  She was found to be markedly bradycardic with heart rate in the 20s to 30s and also was found to have 22nd atrial standstill and ventricular pauses per EMS and received atropine.  She has been turned down for kidney transplant.  I do not think she needs temporary pacemaker. Patient has no capture from the external pacer, underlying 25 bpm with junctional escape and narrow complex rhythm.  Blood pressure systolic is >90 mmHg, I would like to avoid being temporary pacemaker, will start her on dopamine at 5 mcg/kg/min, albuterol inhalation through ET tube, patient has been intubated for comfort, additional D50 and insulin 4 units to be administered.  I am available to place temporary pacemaker if needed.  Also we could position the defibrillator pad much more higher and to slight lateral aspect which may improve capture if needed.  As patient is intubated, no issues losing hide chills for pacing if needed.  But hemodynamically stable, unless unable to dialyze her and will place a temporary Pacemaker.  Addendum: Patient received hemodialysis, presently in sinus tachycardia with low blood pressure, presently on dopamine and also norepinephrine, will discontinue dopamine due to elevated heart rate, continue norepinephrine and further management per pulmonary critical care.  Unless cardiac issues were to arise, conservative therapy from cardiac standpoint.  Please call if questions.  Critical care time spent 45 minutes in evaluation of  patient, review of charts, coordination of care.   Yates Decamp, MD, [facility] 10/20/2022, 3:02 PM Office: [phone]

## 2022-10-20 NOTE — Procedures (Signed)
HD Note:  Some information was entered later than the data was gathered due to patient care needs. The stated time with the data is accurate.   Patient treatment completed at bedside.  Patient is sedated and ventilated at this time. Patient remained sedated during the entire treatment.  TX duration: 3.5 hours  Patient tolerated well.   Hand-off given to patient's nurse.   Access used: Left upper arm AVF.  Fistula positive for Thrill and bruit.  What appears to be older bruising around the upper portion of the fistula is not significant for heat or inflammation. Access issues: No issues  Total UF removed: 2000 ml   [facility]

## 2022-10-20 NOTE — Procedures (Signed)
Patient Name: Stacey Chambers  MRN: [id number]  Epilepsy Attending: Charlsie Quest  Referring Physician/Provider: Gleason, Darcella Gasman, PA-C  Date: 10/20/2022 Duration: 29.14 mins  Patient history: 72yo F with ams. EEG to evaluate for seizure  Level of alertness:  comatose/sedated  AEDs during EEG study: Propofol  Technical aspects: This EEG study was done with scalp electrodes positioned according to the 10-20 International system of electrode placement. Electrical activity was reviewed with band pass filter of 1-70Hz , sensitivity of 7 uV/mm, display speed of 39mm/sec with a  notched filter applied as appropriate. EEG data were recorded continuously and digitally stored.  Video monitoring was available and reviewed as appropriate.  Description: EEG showed continuous generalized predominantly 5 to 7 Hz theta slowing admixed with intermittent 2-3hz  delta slowing. Hyperventilation and photic stimulation were not performed.     ABNORMALITY - Continuous slow, generalized  IMPRESSION: This study is suggestive of moderate to severe diffuse encephalopathy. No seizures or epileptiform discharges were seen throughout the recording.  Stacey Chambers

## 2022-10-20 NOTE — Progress Notes (Signed)
Critical ABG results were given to CCM. Per verbal order from CCM, RT increased RR to 28.

## 2022-10-20 NOTE — TOC CM/SW Note (Addendum)
.   Transition of Care [facility]) Screening Note   Patient Details  Name: Stacey Chambers Date of Birth: 31-Jul-1950   Transition of [facility]) CM/SW Contact:    Elliot Cousin, RN Phone Number: [phone] 10/20/2022, 1:41 PM    Transition of Care Department [facility]) has reviewed patient. We will continue to monitor patient advancement through interdisciplinary progression rounds. Chart reviewed. Pt Bradycardia, HB, HD with new onset seizure activity on vent. Attempted call to pt's family, no contacts listed. Message sent to pt's Unit RN to update contacts.   Contacted PCP's office and no contact listed for pt. Contacted pt's cell number and no answer.

## 2022-10-20 NOTE — Progress Notes (Signed)
Pt transported from ED25 to CT1 and then to 2H07 on the vent without any complications.

## 2022-10-20 NOTE — Procedures (Signed)
Arterial Catheter Insertion Procedure Note  Stacey Chambers  [id number]  [birth year]  Date:10/20/22  Time:9:34 AM    Provider Performing: Darcella Gasman Maylani Embree    Procedure: Insertion of Arterial Line (40981) with US guidance (19147)   Indication(s) Blood pressure monitoring and/or need for frequent ABGs  Consent Unable to obtain consent due to emergent nature of procedure.  Anesthesia None   Time Out Verified patient identification, verified procedure, site/side was marked, verified correct patient position, special equipment/implants available, medications/allergies/relevant history reviewed, required imaging and test results available.   Sterile Technique Maximal sterile technique including full sterile barrier drape, hand hygiene, sterile gown, sterile gloves, mask, hair covering, sterile ultrasound probe cover (if used).   Procedure Description Area of catheter insertion was cleaned with chlorhexidine and draped in sterile fashion. With real-time ultrasound guidance an arterial catheter was placed into the left femoral artery.  Appropriate arterial tracings confirmed on monitor.     Complications/Tolerance None; patient tolerated the procedure well.   EBL Minimal   Specimen(s) None   Darcella Gasman Stacey Zeek, PA-C

## 2022-10-20 NOTE — Procedures (Signed)
   I was present at this dialysis session, have reviewed the session itself and made  appropriate changes Vinson Moselle MD [facility] pager [phone]   10/20/2022, 5:43 PM

## 2022-10-20 NOTE — Progress Notes (Signed)
Pt febrile 100.4 oral. Notified elink, requested tylenol. Awaiting further orders.

## 2022-10-20 NOTE — Progress Notes (Signed)
Pt's L eye contact lens removed and placed in a labeled sterile container with sterile saline solution. Placed at bedside with pt's other belongings.

## 2022-10-20 NOTE — ED Notes (Signed)
Pt intubated at this time.    of Etomidate given @ 0756  of Rocuronium given @ 0757  7.5 ETT @ 0758 20 @ lip +color change Bilateral breath sounds

## 2022-10-20 NOTE — ED Provider Notes (Signed)
[place] EMERGENCY DEPARTMENT AT [facility] Provider Note   CSN: [id number] Arrival date & time: 10/20/22  4782     History  Chief complaint: Bradycardia, syncope  Stacey Chambers is a 72 y.o. female.  HPI   Patient has history of multiple medical problems including chronic kidney disease on dialysis, diabetes, hypertension.  Patient was at dialysis today.  She became unresponsive.  They reported possible seizure-like activity.  However when patient was being transported to the hospital patient went into asystole for approximately 20 seconds and then she became bradycardic.  She was given 1 mg of atropine.  Patient now complains of pain in her chest.  She also feels short of breath and is lightheaded.  Home Medications Prior to Admission medications   Medication Sig Start Date End Date Taking? Authorizing Provider  ACCU-CHEK GUIDE test strip  02/01/21   [provider]  Accu-Chek Softclix Lancets lancets  01/31/21   [provider]  acetaminophen (TYLENOL) 500 MG tablet Take 500 mg by mouth every 8 (eight) hours as needed for moderate pain.    [provider]  albuterol (VENTOLIN HFA) 108 (90 Base) MCG/ACT inhaler Inhale 1 puff into the lungs every 4 (four) hours as needed for wheezing or shortness of breath. 09/14/22   [provider]  amLODipine (NORVASC) 2.5 MG tablet Take 2.5 mg by mouth daily. 11/25/21   [provider]  B Complex-C-Zn-Folic Acid (DIALYVITE 800 WITH ZINC) 0.8 MG TABS Take 1 tablet by mouth at bedtime. 03/07/22   [provider]  esomeprazole (NEXIUM) 40 MG capsule Take 1 capsule (40 mg total) by mouth 2 (two) times daily. Patient not taking: Reported on 09/16/2022 09/13/21   Mansouraty, Netty Starring., MD  ferrous gluconate (FERGON) 324 MG tablet Take 1 tablet (324 mg total) by mouth daily with breakfast. Patient not taking: Reported on 09/16/2022 09/13/21   Mansouraty, Netty Starring., MD  levothyroxine  (SYNTHROID) 25 MCG tablet Take 25 mcg by mouth daily. 11/17/21   [provider]  lidocaine-prilocaine (EMLA) cream Apply 1 Application topically 3 (three) times a week.    [provider]  losartan (COZAAR) 25 MG tablet Take 25 mg by mouth at bedtime. 07/06/22   [provider]  losartan (COZAAR) 50 MG tablet Take 50 mg by mouth daily.    [provider]  metoprolol succinate (TOPROL-XL) 50 MG 24 hr tablet Take 50 mg by mouth daily. 10/20/21   [provider]  mirtazapine (REMERON) 15 MG tablet Take 15 mg by mouth at bedtime. 10/10/22   [provider]  mirtazapine (REMERON) 7.5 MG tablet Take 7.5 mg by mouth at bedtime.    [provider]  predniSONE (DELTASONE) 10 MG tablet Take 40mg  daily for 3days,Take 30mg  daily for 3days,Take 20mg  daily for 3days,Take 10mg  daily for 3days, then stop 09/22/22   Rolly Salter, MD  sevelamer carbonate (RENVELA) 800 MG tablet Take 800 mg by mouth 3 (three) times daily. 03/09/22   [provider]      Allergies    Patient has no known allergies.    Review of Systems   Review of Systems  Physical Exam Updated Vital Signs BP (!) 213/74   Pulse (!) 59   Temp (!) 97.5 F (36.4 C) (Oral)   Resp (!) 40   Ht 1.524 m (5')   Wt 62.6 kg Comment: most recent weight recorded  SpO2 100%   BMI 26.95 kg/m  Physical Exam Vitals and  nursing note reviewed.  Constitutional:      General: She is in acute distress.     Appearance: She is well-developed. She is ill-appearing.  HENT:     Head: Normocephalic and atraumatic.     Right Ear: External ear normal.     Left Ear: External ear normal.  Eyes:     General: No scleral icterus.       Right eye: No discharge.        Left eye: No discharge.     Conjunctiva/sclera: Conjunctivae normal.  Neck:     Trachea: No tracheal deviation.  Cardiovascular:     Rate and Rhythm: Bradycardia present.  Pulmonary:     Effort: Pulmonary effort is normal. No  respiratory distress.     Breath sounds: Normal breath sounds. No stridor.  Abdominal:     General: There is no distension.  Musculoskeletal:        General: No swelling or deformity.     Cervical back: Neck supple.  Skin:    General: Skin is warm and dry.     Findings: No rash.  Neurological:     Mental Status: She is alert. Mental status is at baseline.     Cranial Nerves: No dysarthria or facial asymmetry.     Motor: Weakness present. No seizure activity.     Comments: Generalized weakness, alert answering questions appropriately, no facial droop, no focal deficits     ED Results / Procedures / Treatments   Labs (all labs ordered are listed, but only abnormal results are displayed) Labs Reviewed  BASIC METABOLIC PANEL - Abnormal; Notable for the following components:      Result Value   Sodium 130 (*)    Potassium 6.2 (*)    Chloride 94 (*)    CO2 18 (*)    Glucose, Bld 176 (*)    BUN 30 (*)    Creatinine, Ser 4.87 (*)    Calcium 8.2 (*)    GFR, Estimated 9 (*)    Anion gap 18 (*)    All other components within normal limits  CBC - Abnormal; Notable for the following components:   RBC 3.19 (*)    Hemoglobin 11.3 (*)    HCT 33.9 (*)    MCV 106.3 (*)    MCH 35.4 (*)    Platelets 91 (*)    All other components within normal limits  MAGNESIUM - Abnormal; Notable for the following components:   Magnesium 2.7 (*)    All other components within normal limits  CBG MONITORING, ED - Abnormal; Notable for the following components:   Glucose-Capillary 169 (*)    All other components within normal limits  I-STAT CHEM 8, ED - Abnormal; Notable for the following components:   Sodium 129 (*)    Potassium 6.3 (*)    BUN 31 (*)    Creatinine, Ser 5.60 (*)    Glucose, Bld 169 (*)    Calcium, Ion 0.96 (*)    TCO2 18 (*)    Hemoglobin 11.6 (*)    HCT 34.0 (*)    All other components within normal limits  BLOOD GAS, ARTERIAL  TROPONIN I (HIGH SENSITIVITY)  TROPONIN I (HIGH  SENSITIVITY)    EKG EKG Interpretation  Date/Time:  Friday October 20 2022 16:10:96 EDT Ventricular Rate:  33 PR Interval:    QRS Duration: 136 QT Interval:  497 QTC Calculation: 369 R Axis:   -58 Text Interpretation: AV block, complete (third degree)  Right bundle branch block LVH with IVCD and secondary repol abnrm Confirmed by Linwood Dibbles [phone]) on 10/20/2022 7:38:32 AM  Radiology DG Chest Portable 1 View  Result Date: 10/20/2022 CLINICAL DATA:  NG/OG tube placement. EXAM: PORTABLE CHEST 1 VIEW Abdomen one view COMPARISON:  None Available. FINDINGS: ETT tip is 3 cm from the carina. Cardiomegaly. Diffuse interstitial opacities. Small right pleural effusion. No evidence of pneumothorax. NG/OG tube tip and side port are in the stomach. Nonobstructive bowel-gas pattern. Orthopedic hardware of the lumbar spine. IMPRESSION: 1. ETT tip is 3 cm from the carina. 2. NG/OG tube tip and side port are in the stomach. Electronically Signed   By: Allegra Lai M.D.   On: 10/20/2022 08:27   DG Abdomen 1 View  Result Date: 10/20/2022 CLINICAL DATA:  NG/OG tube placement. EXAM: PORTABLE CHEST 1 VIEW Abdomen one view COMPARISON:  None Available. FINDINGS: ETT tip is 3 cm from the carina. Cardiomegaly. Diffuse interstitial opacities. Small right pleural effusion. No evidence of pneumothorax. NG/OG tube tip and side port are in the stomach. Nonobstructive bowel-gas pattern. Orthopedic hardware of the lumbar spine. IMPRESSION: 1. ETT tip is 3 cm from the carina. 2. NG/OG tube tip and side port are in the stomach. Electronically Signed   By: Allegra Lai M.D.   On: 10/20/2022 08:27    Procedures Temporary pacer  Date/Time: 10/20/2022 7:43 AM  Performed by: Linwood Dibbles, MD Authorized by: Linwood Dibbles, MD  Consent: Verbal consent obtained. Risks and benefits: risks, benefits and alternatives were discussed Consent given by: patient Time out: Immediately prior to procedure a "time out" was called to verify  the correct patient, procedure, equipment, support staff and site/side marked as required.  Sedation: Patient sedated: no  Comments: External pacing initiated.  Capture obtained.      Procedure Name: Intubation Date/Time: 10/20/2022 8:03 AM  Performed by: Linwood Dibbles, MDPre-anesthesia Checklist: Patient identified, Patient being monitored, Emergency Drugs available, Timeout performed and Suction available Oxygen Delivery Method: Non-rebreather mask Preoxygenation: Pre-oxygenation with 100% oxygen Induction Type: Rapid sequence Ventilation: Mask ventilation without difficulty Laryngoscope Size: Glidescope Grade View: Grade I Tube size: 7.5 mm Number of attempts: 1 Airway Equipment and Method: Video-laryngoscopy Placement Confirmation: ETT inserted through vocal cords under direct vision, CO2 detector and Breath sounds checked- equal and bilateral Secured at: 20 cm Tube secured with: ETT holder Dental Injury: Teeth and Oropharynx as per pre-operative assessment     .Critical Care  Performed by: Linwood Dibbles, MD Authorized by: Linwood Dibbles, MD   Critical care provider statement:    Critical care time (minutes):  45   Critical care was time spent personally by me on the following activities:  Development of treatment plan with patient or surrogate, discussions with consultants, evaluation of patient's response to treatment, examination of patient, ordering and review of laboratory studies, ordering and review of radiographic studies, ordering and performing treatments and interventions, pulse oximetry, re-evaluation of patient's condition and review of old charts     Medications Ordered in ED Medications  fentaNYL in NS (71mcg/ml) infusion-PREMIX (100 mcg/hr Intravenous Rate/Dose Change 10/20/22 0913)  fentaNYL (SUBLIMAZE) bolus via infusion 25-100 mcg (has no administration in time range)  propofol (DIPRIVAN) 1000 MG/100ML infusion (0 mcg/kg/min  62.6 kg Intravenous  Stopped 10/20/22 0854)  albuterol (PROVENTIL) (2.5 MG/3ML) 0.083% nebulizer solution 10 mg (has no administration in time range)  DOPamine (INTROPIN) 800 mg in dextrose 5 % 250 mL (3.2 mg/mL) infusion (2.5 mcg/kg/min  62.6 kg Intravenous  Rate/Dose Change 10/20/22 0910)  polyethylene glycol (MIRALAX / GLYCOLAX) packet 17 g (has no administration in time range)  docusate (COLACE) 50 MG/5ML liquid 100 mg (has no administration in time range)  fentaNYL (SUBLIMAZE) injection 50 mcg (50 mcg Intravenous Given 10/20/22 0742)  fentaNYL (SUBLIMAZE) injection 25 mcg (25 mcg Intravenous Given 10/20/22 0806)  calcium gluconate inj 10% (1 g) URGENT USE ONLY! (1 g Intravenous Given 10/20/22 0819)  insulin aspart (novoLOG) injection 5 Units (5 Units Intravenous Given 10/20/22 0823)    And  dextrose 50 % solution 50 mL (50 mLs Intravenous Given 10/20/22 0822)  sodium bicarbonate injection 50 mEq (50 mEq Intravenous Given 10/20/22 0900)  insulin aspart (novoLOG) injection 5 Units (5 Units Intravenous Given 10/20/22 0900)  dextrose 50 % solution 50 mL (50 mLs Intravenous Given 10/20/22 0900)    ED Course/ Medical Decision Making/ A&P Clinical Course as of 10/20/22 0919  Fri Oct 20, 2022  0454 Patient complaining of increasing respiratory difficulty.  She does have increased work of breathing.  Patient did not receive her dialysis this morning. [JK]  0808 REviewed case with Dr Odis Hollingshead.  WIll come evaluate patient [JK]  0820 Case discussed with critical care [JK]  [phone] Case discussed with Dr. Charlann Lange [JK]  [phone] Initial troponin normal [JK]    Clinical Course User Index [JK] Linwood Dibbles, MD                             Medical Decision Making Problems Addressed: Chronic renal failure, stage 5: chronic illness or injury with exacerbation, progression, or side effects of treatment Complete heart block: acute illness or injury that poses a threat to life or bodily functions Hyperkalemia: acute illness or injury that  poses a threat to life or bodily functions  Amount and/or Complexity of Data Reviewed Labs: ordered. Decision-making details documented in ED Course. Radiology: ordered and independent interpretation performed.  Risk OTC drugs. Prescription drug management. Decision regarding hospitalization.   Patient presented to the ED for evaluation of a syncopal episode versus a seizure.  EMS reported an episode of asystole during transport.  Suspect the initial episode was syncopal and not seizure related.  Patient was noted to be in complete heart block on arrival.  Patient was also hypotensive started complaining of severe difficulty breathing.  She was intubated for her work of breathing as well as to facilitate external pacing.  Patient was externally paced with capture initially.  Improvement in her blood pressure and vital signs.  Patient was noted to be hyperkalemic.  It is possible this could be contributing to her heart block.  Patient was treated with  insulin glucose and calcium.  I have consulted with nephrology Dr. Charlann Lange.  I consulted with Dr. Odis Hollingshead cardiology and Dr. Jacinto Halim from his practice arrived at the bedside.  Critical care has also evaluated the patient.  Patient has remained stable after intubation.  Will plan on dialysis admission to the hospital continue to monitor to see if she needs continuous pacemaker        Final Clinical Impression(s) / ED Diagnoses Final diagnoses:  Complete heart block  Hyperkalemia  Chronic renal failure, stage 5    Rx / DC Orders ED Discharge Orders     None         Linwood Dibbles, MD 10/20/22 [phone]

## 2022-10-20 NOTE — ED Triage Notes (Signed)
Pt BIB EMS from pt's dialysis center. Per EMS, pt went to dialysis this morning and started having seizure like activity and pt became unresponsive. While being transported to hospital, EMS noted pt went into asystole for approximately 20 seconds and then became bradycardic.  atropine given en route. Pt currently awake and responsive.   BP 184/83 HR 88 O2 100% RA RR 24 BG 183  20g R hand

## 2022-10-20 NOTE — Progress Notes (Signed)
MB arrived to room for EEG. Pt nurse advised that patient was in Dialysis and not available and scheduled to continue dialysis until approximately 7:00 PM tonight. Nurse advised that 3rd shift may follow up at a later time.

## 2022-10-20 NOTE — Consult Note (Signed)
Renal Service Consult Note [facility]  Stacey Chambers 10/20/2022 Stacey Krabbe, MD Requesting Physician: Dr. [name]  Reason for Consult: ESRD pt w/ CHB and hyperkalemia HPI: The patient is a 72 y.o. year-old w/ PMH as below who presented to ED early this morning sent from pt's HD center. Per EMS, pt went to HD this am and started having seizure-like activity for 20 sec then became bradycardic.  atropine was given. In ED pt was alert w/ BP 180/83, HR 88. Pt c/o chest pain, SOB. EKG showed complete heart block on arrival. Pt Bp's then dropped and pt had severe SOB. She was intubated for WOB and to facilitate external pacing. Attempts to pace externally were made but she would not capture. Pt was rx'd w/ IV insulin /glucose, IV Ca++. Cardiology was consulted. We are asked to see for esrd and hyperkalemia.   Pt seen in ED. CCM was putting a line in. Exam showed no edema. Pt on vent and not communicative otherwise.   ROS - n/a  Past Medical History  Past Medical History:  Diagnosis Date   Anemia    Diabetes mellitus without complication (HCC)    Hypertension    Vision loss    Past Surgical History  Past Surgical History:  Procedure Laterality Date   AV FISTULA PLACEMENT Left 12/23/2021   Procedure: INSERTION OF LEFT ARM BRACHIOCEPHALIC ARTERIOVENOUS FISTULA;  Surgeon: Cephus Shelling, MD;  Location: [facility] OR;  Service: Vascular;  Laterality: Left;   BACK SURGERY     COLONOSCOPY     IR FLUORO GUIDE CV LINE RIGHT  12/16/2021   IR US GUIDE VASC ACCESS RIGHT  12/16/2021   UPPER GASTROINTESTINAL ENDOSCOPY     Family History  Family History  Problem Relation Age of Onset   Diabetes Brother    Colon cancer Neg Hx    Esophageal cancer Neg Hx    Stomach cancer Neg Hx    Pancreatic cancer Neg Hx    Inflammatory bowel disease Neg Hx    Liver disease Neg Hx    Rectal cancer Neg Hx    Colon polyps Neg Hx    Social History  reports that she has never smoked.  She has never used smokeless tobacco. She reports that she does not currently use drugs after having used the following drugs: Marijuana. She reports that she does not drink alcohol. Allergies No Known Allergies Home medications Prior to Admission medications   Medication Sig Start Date End Date Taking? Authorizing Provider  ACCU-CHEK GUIDE test strip  02/01/21   [provider]  Accu-Chek Softclix Lancets lancets  01/31/21   [provider]  acetaminophen (TYLENOL) 500 MG tablet Take 500 mg by mouth every 8 (eight) hours as needed for moderate pain.    [provider]  albuterol (VENTOLIN HFA) 108 (90 Base) MCG/ACT inhaler Inhale 1 puff into the lungs every 4 (four) hours as needed for wheezing or shortness of breath. 09/14/22   [provider]  amLODipine (NORVASC) 2.5 MG tablet Take 2.5 mg by mouth daily. 11/25/21   [provider]  B Complex-C-Zn-Folic Acid (DIALYVITE 800 WITH ZINC) 0.8 MG TABS Take 1 tablet by mouth at bedtime. 03/07/22   [provider]  esomeprazole (NEXIUM) 40 MG capsule Take 1 capsule (40 mg total) by mouth 2 (two) times daily. Patient not taking: Reported on 09/16/2022 09/13/21   Mansouraty, Netty Starring., MD  ferrous gluconate (FERGON) 324 MG tablet Take 1 tablet (324 mg  total) by mouth daily with breakfast. Patient not taking: Reported on 09/16/2022 09/13/21   Mansouraty, Netty Starring., MD  levothyroxine (SYNTHROID) 25 MCG tablet Take 25 mcg by mouth daily. 11/17/21   [provider]  lidocaine-prilocaine (EMLA) cream Apply 1 Application topically 3 (three) times a week.    [provider]  losartan (COZAAR) 25 MG tablet Take 25 mg by mouth at bedtime. 07/06/22   [provider]  losartan (COZAAR) 50 MG tablet Take 50 mg by mouth daily.    [provider]  metoprolol succinate (TOPROL-XL) 50 MG 24 hr tablet Take 50 mg by mouth daily. 10/20/21   [provider]  mirtazapine (REMERON) 15 MG  tablet Take 15 mg by mouth at bedtime. 10/10/22   [provider]  mirtazapine (REMERON) 7.5 MG tablet Take 7.5 mg by mouth at bedtime.    [provider]  predniSONE (DELTASONE) 10 MG tablet Take  daily for 3days,Take  daily for 3days,Take  daily for 3days,Take  daily for 3days, then stop 09/22/22   Rolly Salter, MD  sevelamer carbonate (RENVELA) 800 MG tablet Take 800 mg by mouth 3 (three) times daily. 03/09/22   [provider]     Vitals:   10/20/22 9604 10/20/22 0952 10/20/22 0957 10/20/22 1000  BP:    (!) 165/56  Pulse: (!) 106 (!) 105 (!) 106 (!) 105  Resp: (!) 24 (!) 24 19 (!) 22  Temp:      TempSrc:      SpO2: 99% 99% 99% 99%  Weight:      Height:       Exam Gen on vent, sedated No rash, cyanosis or gangrene Sclera anicteric, throat w/ ETT No jvd or bruits Chest clear anterior/ lateral RRR no MRG Abd soft ntnd no mass or ascites +bs GU normal MS no joint effusions or deformity Ext no UE or LE edema, no wounds or ulcers Neuro is on vent, sedated    LUA AVF+bruit    Home meds include - norvasc 2.5, nexium, synthroid, losartan 25-50mg  qd, metoprolol  xl qd, remeron, prednisone taper, renvela 800 ac tid, prns     OP HD: AF MWF 3.5h  400/1.5  62.2kg   2/2 bath  dry wt pend   AVF   - last HD 4/17, post wt 62.4kg - mircera 50 mcg IV  q2 wks, last 4/8, due 4/22    CXR - bilat vasc congestion, early edema appearance   Assessment/ Plan: Bradycardia/ complete heart block - attempts at external pacing were not successful. She improved w/ dopamine gtt, IV insulin/ glucose/ Ca++/ bicarb for hyperkalemia. Pt admitted to ICU. Plan is for HD when in the ICU w/ low K+ bath.  ESRD - on HD MWF. Has not missed HD. HD today in ICU as above.  HTN/ volume - BP's are stable in ICU. Getting dopamine for bradycardia. CXR showed what looks like early edema. Plan is for UF 3 L as tolerated.  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 Next  OP esa due on 4/22.  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 Resume binders when eating.  AHRF - likely due to #1, intubated, per CCM DM2 - per pmd      Vinson Moselle  MD [facility] 10/20/2022, 10:36 AM  Recent Labs  Lab 10/20/22 0752 10/20/22 0755 10/20/22 0939  HGB 11.3* 11.6* 11.6*  CALCIUM 8.2*  --   --   CREATININE 4.87*  5.60*  --   K 6.2* 6.3* 6.1*   Inpatient medications:  famotidine  20 mg Per Tube BID    DOPamine 1 mcg/kg/min (10/20/22 0922)   fentaNYL infusion INTRAVENOUS 200 mcg/hr (10/20/22 1026)   propofol (DIPRIVAN) infusion 50 mcg/kg/min (10/20/22 1027)   docusate, fentaNYL, midazolam, polyethylene glycol

## 2022-10-20 NOTE — H&P (Addendum)
NAME:  Stacey Chambers, MRN:  [id number], DOB:  Jan 16, 1951, LOS: 0 ADMISSION DATE:  10/20/2022, CONSULTATION DATE:  10/20/22 REFERRING MD:  EDP, CHIEF COMPLAINT:  sz, bradycardia   History of Present Illness:  Stacey Chambers is a 72 y.o. F with PMH significant for Type 2 DM, ESRD, thrombocytopenia and iron deficiency anemia, PUD, HFpEF and cirrhosis who was at dialysis on the day of admission and had seizure-like activity before HD was initiated.  No reported duration and descriptor of this activity, history taken from EMS as patient has no emergency contacts listed and is intubated.   After sz activity she went unresponsive but had a pulse and was breathing, en route with EMS had about 20 seconds of asystole, does not sounds like chest compressions were started, HR returned and she was bradycardic in the 30's after atropine.    In the ED she was initially mentating, through remained bradycardic and became increasingly short of breath, so was intubated and transcutaneously paced.  Cardiology saw patient, as external pacing was not capturing, this was discontinued.  Labs significant for K 6.3, Na 129, platelets 91, trop 13.   Her HR and blood pressure responded well to dopamine and she was seen by nephrology.  PCCM consulted for admission  Pertinent  Medical History   has a past medical history of Anemia, Diabetes mellitus without complication (HCC), Hypertension, and Vision loss. Cirrhosis HFpEF, ESRD, PUD  Significant Hospital Events: Including procedures, antibiotic start and stop dates in addition to other pertinent events   4/19 admit to ICU after possible sz, episode of asystole and bradycardia in the setting of hyperkalemia, intubated   Interim History / Subjective:  No further sz activity, HR responded well to above treatment, became hypertensive and dopamine decreased, sedation increased  EKG shows complete HB  Objective   Blood pressure (!) 213/74, pulse 66, temperature (!) 97.5  F (36.4 C), temperature source Oral, resp. rate (!) 31, height 5' (1.524 m), weight 62.6 kg, SpO2 99 %.    Vent Mode: PRVC FiO2 (%):  [100 %] 100 % Set Rate:  [18 bmp] 18 bmp Vt Set:  [500 mL] 500 mL PEEP:  [5 cmH20] 5 cmH20 Plateau Pressure:  [18 cmH20] 18 cmH20  No intake or output data in the 24 hours ending 10/20/22 0935 Filed Weights   10/20/22 0800  Weight: 62.6 kg    General:  well-nourished F, intubated and sedated HEENT: MM pink/moist, R pupil responsive, L eye chronic vision loss with contact Neuro: examined on sedation, triggering vent, not otherwise responsive  CV: s1s2 rrr, no m/r/g PULM:  clear bilaterally on mechanical ventilation GI: soft, non-distended  Extremities: warm/dry, no edema  Skin: no rashes or lesions   Resolved Hospital Problem list     Assessment & Plan:    Bradycardia, Complete heart block  Initially externally paced but was not capturing, improved with dopamine, insulin, CA and bicarb -seen by cardiology, do not think will require pacemaker, likely improvement with correction of electrolyte derangement  -admit to ICU -continue dopamine gtt, requirement down-trending -she had an echo one month ago with preserved EF, small pericardial effusion. Will defer to cardiology whether repeat echo is indicated    New onset sz activity Unclear description or duration, does sound like she was post-ictal -CT head pending -EEG -seizure precautions and prn ativan   Acute Hypercarbic Respiratory Failure  pH 7.05, pCO2 80, RR increased from 12 to 28 -repeat ABG 2 hrs -fentanyl, propofol, versed for  PAD protocol --Maintain full vent support with SAT/SBT as tolerated -titrate Vent setting to maintain SpO2 greater than or equal to 90%. -HOB elevated 30 degrees. -Plateau pressures less than 30 cm H20.  -Follow chest x-ray, ABG prn.   -Bronchial hygiene and RT/bronchodilator protocol.    ESRD  Hyperkalemia K medically treated in the ED -repeat  BMP, seen by nephrology  Thrombocytopenia  -chronic and stable with platelets 91   Type 2 DM -SSI  Best Practice (right click and "Reselect all SmartList Selections" daily)   Diet/type: NPO DVT prophylaxis: SCD, pending head CT GI prophylaxis: H2B Lines: N/A Foley:  N/A Code Status:  full code Last date of multidisciplinary goals of care discussion [pending, no emergency contact numbers in EPIC]  Labs   CBC: Recent Labs  Lab 10/20/22 0752 10/20/22 0755  WBC 7.0  --   HGB 11.3* 11.6*  HCT 33.9* 34.0*  MCV 106.3*  --   PLT 91*  --     Basic Metabolic Panel: Recent Labs  Lab 10/20/22 0752 10/20/22 0755  NA 130* 129*  K 6.2* 6.3*  CL 94* 101  CO2 18*  --   GLUCOSE 176* 169*  BUN 30* 31*  CREATININE 4.87* 5.60*  CALCIUM 8.2*  --   MG 2.7*  --    GFR: Estimated Creatinine Clearance: 7.8 mL/min (A) (by C-G formula based on SCr of 5.6 mg/dL (H)). Recent Labs  Lab 10/20/22 0752  WBC 7.0    Liver Function Tests: No results for input(s): "AST", "ALT", "ALKPHOS", "BILITOT", "PROT", "ALBUMIN" in the last 168 hours. No results for input(s): "LIPASE", "AMYLASE" in the last 168 hours. No results for input(s): "AMMONIA" in the last 168 hours.  ABG    Component Value Date/Time   TCO2 18 (L) 10/20/2022 0755     Coagulation Profile: No results for input(s): "INR", "PROTIME" in the last 168 hours.  Cardiac Enzymes: No results for input(s): "CKTOTAL", "CKMB", "CKMBINDEX", "TROPONINI" in the last 168 hours.  HbA1C: Hemoglobin A1C  Date/Time Value Ref Range Status  07/12/2022 10:43 AM 4.7 4.0 - 5.6 % Final   Hgb A1c MFr Bld  Date/Time Value Ref Range Status  03/30/2022 01:27 PM 5.1 4.6 - 6.5 % Final    Comment:    Glycemic Control Guidelines for People with Diabetes:Non Diabetic:  <6%Goal of Therapy: <7%Additional Action Suggested:  >8%   12/02/2021 04:58 AM 4.9 4.8 - 5.6 % Final    Comment:    (NOTE) Pre diabetes:          5.7%-6.4%  Diabetes:               >6.4%  Glycemic control for   <7.0% adults with diabetes     CBG: Recent Labs  Lab 10/20/22 0752  GLUCAP 169*    Review of Systems:   Unable to obtain  Past Medical History:  She,  has a past medical history of Anemia, Diabetes mellitus without complication (HCC), Hypertension, and Vision loss.   Surgical History:   Past Surgical History:  Procedure Laterality Date   AV FISTULA PLACEMENT Left 12/23/2021   Procedure: INSERTION OF LEFT ARM BRACHIOCEPHALIC ARTERIOVENOUS FISTULA;  Surgeon: Cephus Shelling, MD;  Location: [facility] OR;  Service: Vascular;  Laterality: Left;   BACK SURGERY     COLONOSCOPY     IR FLUORO GUIDE CV LINE RIGHT  12/16/2021   IR US GUIDE VASC ACCESS RIGHT  12/16/2021   UPPER GASTROINTESTINAL ENDOSCOPY  Social History:   reports that she has never smoked. She has never used smokeless tobacco. She reports that she does not currently use drugs after having used the following drugs: Marijuana. She reports that she does not drink alcohol.   Family History:  Her family history includes Diabetes in her brother. There is no history of Colon cancer, Esophageal cancer, Stomach cancer, Pancreatic cancer, Inflammatory bowel disease, Liver disease, Rectal cancer, or Colon polyps.   Allergies No Known Allergies   Home Medications  Prior to Admission medications   Medication Sig Start Date End Date Taking? Authorizing Provider  ACCU-CHEK GUIDE test strip  02/01/21   [provider]  Accu-Chek Softclix Lancets lancets  01/31/21   [provider]  acetaminophen (TYLENOL) 500 MG tablet Take 500 mg by mouth every 8 (eight) hours as needed for moderate pain.    [provider]  albuterol (VENTOLIN HFA) 108 (90 Base) MCG/ACT inhaler Inhale 1 puff into the lungs every 4 (four) hours as needed for wheezing or shortness of breath. 09/14/22   [provider]  amLODipine (NORVASC) 2.5 MG tablet Take 2.5 mg by mouth daily. 11/25/21    [provider]  B Complex-C-Zn-Folic Acid (DIALYVITE 800 WITH ZINC) 0.8 MG TABS Take 1 tablet by mouth at bedtime. 03/07/22   [provider]  esomeprazole (NEXIUM) 40 MG capsule Take 1 capsule (40 mg total) by mouth 2 (two) times daily. Patient not taking: Reported on 09/16/2022 09/13/21   Mansouraty, Netty Starring., MD  ferrous gluconate (FERGON) 324 MG tablet Take 1 tablet (324 mg total) by mouth daily with breakfast. Patient not taking: Reported on 09/16/2022 09/13/21   Mansouraty, Netty Starring., MD  levothyroxine (SYNTHROID) 25 MCG tablet Take 25 mcg by mouth daily. 11/17/21   [provider]  lidocaine-prilocaine (EMLA) cream Apply 1 Application topically 3 (three) times a week.    [provider]  losartan (COZAAR) 25 MG tablet Take 25 mg by mouth at bedtime. 07/06/22   [provider]  losartan (COZAAR) 50 MG tablet Take 50 mg by mouth daily.    [provider]  metoprolol succinate (TOPROL-XL) 50 MG 24 hr tablet Take 50 mg by mouth daily. 10/20/21   [provider]  mirtazapine (REMERON) 15 MG tablet Take 15 mg by mouth at bedtime. 10/10/22   [provider]  mirtazapine (REMERON) 7.5 MG tablet Take 7.5 mg by mouth at bedtime.    [provider]  predniSONE (DELTASONE) 10 MG tablet Take  daily for 3days,Take  daily for 3days,Take  daily for 3days,Take  daily for 3days, then stop 09/22/22   Rolly Salter, MD  sevelamer carbonate (RENVELA) 800 MG tablet Take 800 mg by mouth 3 (three) times daily. 03/09/22   [provider]     Critical care time:  50 minutes    CRITICAL CARE Performed by: Darcella Gasman Denard Tuminello   Total critical care time: 50 minutes  Critical care time was exclusive of separately billable procedures and treating other patients.  Critical care was necessary to treat or prevent imminent or life-threatening deterioration.  Critical care was time spent personally by me on the following  activities: development of treatment plan with patient and/or surrogate as well as nursing, discussions with consultants, evaluation of patient's response to treatment, examination of patient, obtaining history from patient or surrogate, ordering and performing treatments and interventions, ordering and review of laboratory studies, ordering and review of radiographic studies, pulse oximetry and re-evaluation of patient's  condition.  Darcella Gasman Connery Shiffler, PA-C [facility] See Amion for pager If no response to pager , please call [phone] until 7pm After 7:00 pm call Elink  [phone]

## 2022-10-20 NOTE — ED Notes (Signed)
Lab results was reported to the Nurse.

## 2022-10-21 ENCOUNTER — Inpatient Hospital Stay (HOSPITAL_COMMUNITY): Payer: 59

## 2022-10-21 DIAGNOSIS — E875 Hyperkalemia: Secondary | ICD-10-CM

## 2022-10-21 DIAGNOSIS — R001 Bradycardia, unspecified: Secondary | ICD-10-CM | POA: Diagnosis not present

## 2022-10-21 LAB — BASIC METABOLIC PANEL
Anion gap: 10 (ref 5–15)
Anion gap: 14 (ref 5–15)
BUN: 18 mg/dL (ref 8–23)
BUN: 23 mg/dL (ref 8–23)
CO2: 26 mmol/L (ref 22–32)
CO2: 27 mmol/L (ref 22–32)
Calcium: 7.3 mg/dL — ABNORMAL LOW (ref 8.9–10.3)
Calcium: 7.7 mg/dL — ABNORMAL LOW (ref 8.9–10.3)
Chloride: 92 mmol/L — ABNORMAL LOW (ref 98–111)
Chloride: 89 mmol/L — ABNORMAL LOW (ref 98–111)
Creatinine, Ser: 3.5 mg/dL — ABNORMAL HIGH (ref 0.44–1.00)
Creatinine, Ser: 4.18 mg/dL — ABNORMAL HIGH (ref 0.44–1.00)
GFR, Estimated: 14 mL/min — ABNORMAL LOW (ref >60)
GFR, Estimated: 11 mL/min — ABNORMAL LOW (ref >60)
Glucose, Bld: 139 mg/dL — ABNORMAL HIGH (ref 70–99)
Glucose, Bld: 106 mg/dL — ABNORMAL HIGH (ref 70–99)
Potassium: 5.4 mmol/L — ABNORMAL HIGH (ref 3.5–5.1)
Potassium: 5.9 mmol/L — ABNORMAL HIGH (ref 3.5–5.1)
Sodium: 128 mmol/L — ABNORMAL LOW (ref 135–145)
Sodium: 130 mmol/L — ABNORMAL LOW (ref 135–145)

## 2022-10-21 LAB — GLUCOSE, CAPILLARY
Glucose-Capillary: 108 mg/dL — ABNORMAL HIGH (ref 70–99)
Glucose-Capillary: 114 mg/dL — ABNORMAL HIGH (ref 70–99)
Glucose-Capillary: 123 mg/dL — ABNORMAL HIGH (ref 70–99)
Glucose-Capillary: 135 mg/dL — ABNORMAL HIGH (ref 70–99)
Glucose-Capillary: 220 mg/dL — ABNORMAL HIGH (ref 70–99)

## 2022-10-21 LAB — CBC
HCT: 30.1 % — ABNORMAL LOW (ref 36.0–46.0)
Hemoglobin: 9.9 g/dL — ABNORMAL LOW (ref 12.0–15.0)
MCH: 35.2 pg — ABNORMAL HIGH (ref 26.0–34.0)
MCHC: 32.9 g/dL (ref 30.0–36.0)
MCV: 107.1 fL — ABNORMAL HIGH (ref 80.0–100.0)
Platelets: 95 10*3/uL — ABNORMAL LOW (ref 150–400)
RBC: 2.81 MIL/uL — ABNORMAL LOW (ref 3.87–5.11)
RDW: 15.2 % (ref 11.5–15.5)
WBC: 11.3 10*3/uL — ABNORMAL HIGH (ref 4.0–10.5)
nRBC: 0 % (ref 0.0–0.2)

## 2022-10-21 LAB — HEPATITIS B SURFACE ANTIBODY, QUANTITATIVE: Hep B S AB Quant (Post): 2254 m[IU]/mL (ref >9.9)

## 2022-10-21 LAB — POTASSIUM: Potassium: 3.5 mmol/L (ref 3.5–5.1)

## 2022-10-21 LAB — MAGNESIUM: Magnesium: 1.9 mg/dL (ref 1.7–2.4)

## 2022-10-21 LAB — PHOSPHORUS: Phosphorus: 7 mg/dL — ABNORMAL HIGH (ref 2.5–4.6)

## 2022-10-21 LAB — PROCALCITONIN: Procalcitonin: 3.13 ng/mL

## 2022-10-21 LAB — TRIGLYCERIDES: Triglycerides: 201 mg/dL — ABNORMAL HIGH (ref <150)

## 2022-10-21 MED ORDER — ONDANSETRON HCL 4 MG/2ML IJ SOLN
4.0000 mg | Freq: Four times a day (QID) | INTRAMUSCULAR | Status: DC | PRN
  Administered 2022-10-21: 4 mg via INTRAVENOUS

## 2022-10-21 MED ORDER — CHLORHEXIDINE GLUCONATE CLOTH 2 % EX PADS: 6.0000 | MEDICATED_PAD | Freq: Every day | CUTANEOUS | Status: DC

## 2022-10-21 MED ORDER — PIPERACILLIN-TAZOBACTAM IN DEX 2-0.25 GM/50ML IV SOLN
2.2500 g | Freq: Three times a day (TID) | INTRAVENOUS | Status: DC
  Administered 2022-10-21 – 2022-10-23 (×7): 2.25 g via INTRAVENOUS
  Filled 2022-10-21 (×10): qty 50

## 2022-10-21 MED ORDER — ONDANSETRON HCL 4 MG/2ML IJ SOLN
INTRAMUSCULAR | Status: AC
  Administered 2022-10-21: 4 mg via INTRAVENOUS
  Filled 2022-10-21: qty 2

## 2022-10-21 MED ORDER — SODIUM ZIRCONIUM CYCLOSILICATE 10 G PO PACK
10.0000 g | PACK | Freq: Once | ORAL | Status: AC
  Administered 2022-10-21: 10 g
  Filled 2022-10-21: qty 1

## 2022-10-21 MED ORDER — VANCOMYCIN HCL 1250 MG/250ML IV SOLN
1250.0000 mg | Freq: Once | INTRAVENOUS | Status: AC
  Administered 2022-10-21: 1250 mg via INTRAVENOUS
  Filled 2022-10-21: qty 250

## 2022-10-21 MED ORDER — HEPARIN SODIUM (PORCINE) 5000 UNIT/ML IJ SOLN
5000.0000 [IU] | Freq: Three times a day (TID) | INTRAMUSCULAR | Status: DC
  Administered 2022-10-21 – 2022-10-22 (×4): 5000 [IU] via SUBCUTANEOUS
  Filled 2022-10-21 (×3): qty 1

## 2022-10-21 MED ORDER — ACETAMINOPHEN 325 MG PO TABS
650.0000 mg | ORAL_TABLET | Freq: Four times a day (QID) | ORAL | Status: DC | PRN
  Administered 2022-10-21 – 2022-10-27 (×9): 650 mg
  Filled 2022-10-21 (×11): qty 2

## 2022-10-21 MED ORDER — VANCOMYCIN HCL 750 MG/150ML IV SOLN: 750.0000 mg | INTRAVENOUS | Status: DC

## 2022-10-21 MED ORDER — DOPAMINE-DEXTROSE 3.2-5 MG/ML-% IV SOLN
INTRAVENOUS | Status: AC
  Filled 2022-10-21: qty 250

## 2022-10-21 MED ORDER — ORAL CARE MOUTH RINSE: 15.0000 mL | OROMUCOSAL | Status: DC | PRN

## 2022-10-21 MED ORDER — SODIUM CHLORIDE 0.9 % IV SOLN: INTRAVENOUS | Status: DC | PRN

## 2022-10-21 NOTE — Progress Notes (Signed)
Pharmacy Antibiotic Note  Stacey Chambers is a 72 y.o. female admitted on 10/20/2022 with pneumonia secondary to aspiration event during arrest.  Pharmacy has been consulted for vancomycin and cefepime dosing.  WBC has trended up to 11.3 this AM and temperate is up to 101.4F. Vitals remain stable and patient is no longer requiring vasopressors.  Plan: Vancomycin 1250 mg load Vancomycin 750 mg every MWF after HD if full session is tolerated Zosyn 2.25 mg every 8 hours Follow MRSA PCR for de escalation   Height: 5' (152.4 cm) Weight: 65.4 kg (144 lb 2.9 oz) IBW/kg (Calculated) : 45.5  Temp (24hrs), Avg:99.8 F (37.7 C), Min:97.7 F (36.5 C), Max:101.7 F (38.7 C)  Recent Labs  Lab 10/20/22 0752 10/20/22 0755 10/20/22 1737 10/21/22 0415  WBC 7.0  --   --  11.3*  CREATININE 4.87* 5.60* 1.99* 3.50*    Estimated Creatinine Clearance: 12.8 mL/min (A) (by C-G formula based on SCr of 3.5 mg/dL (H)).    No Known Allergies  Antimicrobials this admission: Vancomycin 4/20 >>   Zosyn 4/20 >>     Microbiology results: 4/20 MRSA PCR: IP  Thank you for involving pharmacy in this patient's care.  Enos Fling, PharmD PGY2 Pharmacy Resident 10/21/2022 8:25 AM

## 2022-10-21 NOTE — Procedures (Signed)
Extubation Procedure Note  Patient Details:   Name: Stacey Chambers DOB: 10-27-1950 MRN: [id number]   Airway Documentation:    Vent end date: 10/21/22 Vent end time: 0834   Evaluation  O2 sats: stable throughout Complications: No apparent complications Patient did tolerate procedure well. Bilateral Breath Sounds: Clear, Diminished   Yes  Pt extubated per order to 4L [place], sats 99%. Pt had a positive cuff leak, no stridor noted and able to state name, with a good cough. RT will continue to monitor as needed.   Kerri Perches 10/21/2022, 8:35 AM

## 2022-10-21 NOTE — Progress Notes (Signed)
NAME:  Seona Clemenson, MRN:  [id number], DOB:  05/29/1951, LOS: 1 ADMISSION DATE:  10/20/2022, CONSULTATION DATE:  10/21/22 REFERRING MD:  EDP, CHIEF COMPLAINT:  sz, bradycardia   History of Present Illness:  Anarosa Kubisiak is a 72 y.o. F with PMH significant for Type 2 DM, ESRD, thrombocytopenia and iron deficiency anemia, PUD, HFpEF and cirrhosis who was at dialysis on the day of admission and had seizure-like activity before HD was initiated.  No reported duration and descriptor of this activity, history taken from EMS as patient has no emergency contacts listed and is intubated.   After sz activity she went unresponsive but had a pulse and was breathing, en route with EMS had about 20 seconds of asystole, does not sounds like chest compressions were started, HR returned and she was bradycardic in the 30's after atropine.    In the ED she was initially mentating, through remained bradycardic and became increasingly short of breath, so was intubated and transcutaneously paced.  Cardiology saw patient, as external pacing was not capturing, this was discontinued.  Labs significant for K 6.3, Na 129, platelets 91, trop 13.   Her HR and blood pressure responded well to dopamine and she was seen by nephrology.  PCCM consulted for admission  Pertinent  Medical History   has a past medical history of Anemia, Diabetes mellitus without complication (HCC), Hypertension, and Vision loss. Cirrhosis HFpEF, ESRD, PUD  Significant Hospital Events: Including procedures, antibiotic start and stop dates in addition to other pertinent events   4/19 admit to ICU after possible sz, episode of asystole and bradycardia in the setting of hyperkalemia, intubated   Interim History / Subjective:  No events, awake on vent. Febrile. EEG neg.  Objective   Blood pressure (!) 136/49, pulse (!) 102, temperature (!) 101.7 F (38.7 C), temperature source Oral, resp. rate 18, height 5' (1.524 m), weight 65.4 kg, SpO2  100 %.    Vent Mode: CPAP;PSV FiO2 (%):  [40 %-70 %] 40 % Set Rate:  [28 bmp] 28 bmp Vt Set:  [360 mL] 360 mL PEEP:  [5 cmH20] 5 cmH20 Pressure Support:  [10 cmH20] 10 cmH20 Plateau Pressure:  [17 cmH20-22 cmH20] 22 cmH20   Intake/Output Summary (Last 24 hours) at 10/21/2022 0810 Last data filed at 10/21/2022 0800 Gross per 24 hour  Intake 1287.44 ml  Output 2000 ml  Net -712.56 ml   Filed Weights   10/20/22 0800 10/21/22 0500  Weight: 62.6 kg 65.4 kg   No distress L cataract noted Following commands Ext warm Good strength x 4 Lungs clear  K bumped up again WBC up Plts stable low No CXR Secretions mild on ETT  Resolved Hospital Problem list     Assessment & Plan:    Bradycardia, Complete heart block - thought related to hyperkalemia. Resolved - Tele monitoring  New onset sz activity EEG, CT head neg, suspect related to low flow state. -seizure precautions and prn ativan  Fevers, leukocytosis, abnormal CXR Will tx empirically for HCAP although probably just aspiration peri-code event above. - Vanc/Zosyn, check MRSA PCR, Pct - Trend fever curve  Acute Hypercarbic Respiratory Failure - in context of hemodynamic collapse - Wean to extubate, progressive mobility  ESRD  Hyperkalemia - s/p emergent HD 4/19, hyperK a bit worse again, will give more lokelma, await nephro to see if we can get another round of HD  Thrombocytopenia  -chronic and stable with platelets 91  Type 2 DM -SSI  Best Practice (  right click and "Reselect all SmartList Selections" daily)   Diet/type: NPO DVT prophylaxis: heparin subQ GI prophylaxis: H2B Lines: yes, keep for now Foley:  N/A Code Status:  full code Last date of multidisciplinary goals of care discussion [patient updated at bedside]  33 min cc time Myrla Halsted MD PCCM

## 2022-10-21 NOTE — Progress Notes (Signed)
EEG complete - results pending 

## 2022-10-21 NOTE — Progress Notes (Addendum)
Received patient in bed in ICU Alert and oriented.  Informed consent signed and in chart.   TX duration:3 h  Patient tolerated well.   without acute distress.  Hand-off given to patient's nurse.   Access used: AVF Access issues: none  Total UF removed: 2 L Medication(s) given: none Post HD VS: 138/52 P 96 R 14. Post HD weight: 63.5Kg   [facility]

## 2022-10-21 NOTE — Progress Notes (Signed)
eLink Physician-Brief Progress Note Patient Name: Stacey Chambers DOB: 1950-07-20 MRN: [id number]   Date of Service  10/21/2022  HPI/Events of Note  77F with ESRD on HD, DMII, HFpEF and cirrhosis who is brought to the hospital with seizure like activity before HD today.   Temp 100.4  eICU Interventions  PRN Tylenol     Intervention Category Minor Interventions: Routine modifications to care plan (e.g. PRN medications for pain, fever)  Curren Mohrmann 10/21/2022, 12:15 AM

## 2022-10-21 NOTE — Progress Notes (Addendum)
[facility] [address] [place], [place] [address] [phone]  Pt. glasses at the above dialysis center.

## 2022-10-21 NOTE — Progress Notes (Signed)
[facility] Progress Note  Subjective: K+ improved yest 3-5 range, but this am was back up to 5.4 and then 5.9 at noon. Tried lokelma but she vomited up most of it per Charity fundraiser.  She has been extubated and no c/o's today, awake and alert  Vitals:   10/21/22 0900 10/21/22 1000 10/21/22 1100 10/21/22 1200  BP: (!) 123/51 (!) 129/53 (!) 122/47 (!) 121/49  Pulse: (!) 105 93 96 92  Resp: (!) Temp:    100 F (37.8 C)  TempSrc:    Oral  SpO2: 98% 98% 99% 97%  Weight:      Height:        Exam:  alert, nad , nasal O2  no jvd  Chest cta bilat  Cor reg no RG  Abd soft ntnd no ascites   Ext no sig LE edema   Alert, NF, ox3    LUA AVF+bruit      Home meds include - norvasc 2.5, nexium, synthroid, losartan 25-50mg  qd, metoprolol  xl qd, remeron, prednisone taper, renvela 800 ac tid, prns        OP HD: AF MWF 3.5h  400/1.5  62.2kg   2/2 bath  dry wt pend   AVF  Hep none - last HD 4/17, post wt 62.4kg - mircera 50 mcg IV  q2 wks, last 4/8, due 4/22     CXR - bilat vasc congestion, early edema appearance     Assessment/ Plan: Bradycardia/ complete heart block/ hyperkalemia - attempts at external pacing were not successful. She improved w/ dopamine gtt and hyperkalemia temporizing measures (IV insulin/ glucose/ Ca++/ bicarb). Pt admitted to ICU and underwent HD yesterday w/ low K+ bath. K+ was down yesterday, but today is back up to 5.9. Tried lokelma but she threw up most of it. Will need iHD again as soon as possible.  ESRD - on HD MWF. No missed OP HD. HD yest and again today for #1.  HTN/ volume - admit CXR showed early edema, UF was 2L yest and same goal for today.  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 Next OP esa due on 4/22.  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 Resume binders when eating.  AHRF - likely due to #1, was intubated, now is off the vent.  DM2 - per pmd     Vinson Moselle MD [facility] 10/21/2022, 12:20 PM  Recent Labs  Lab  10/20/22 1336 10/20/22 1737 10/21/22 0415 10/21/22 1118  HGB 10.9*  --  9.9*  --   ALBUMIN  --  3.6  --   --   CALCIUM  --  8.2* 7.3* 7.7*  PHOS  --  4.3 7.0*  --   CREATININE  --  1.99* 3.50* 4.18*  K 5.7* 3.3* 5.4* 5.9*   No results for input(s): "IRON", "TIBC", "FERRITIN" in the last 168 hours. Inpatient medications:  Chlorhexidine Gluconate Cloth  6 each Topical Q0600   famotidine  20 mg Per Tube Daily   heparin injection (subcutaneous)  5,000 Units Subcutaneous Q8H   insulin aspart  0-9 Units Subcutaneous Q4H    sodium chloride     norepinephrine (LEVOPHED) Adult infusion 3 mcg/min (10/21/22 1200)   piperacillin-tazobactam (ZOSYN)  IV Stopped (10/21/22 0935)   [START ON 10/23/2022] vancomycin     sodium chloride, acetaminophen, docusate, ondansetron (ZOFRAN) IV, mouth rinse, polyethylene glycol

## 2022-10-22 DIAGNOSIS — R001 Bradycardia, unspecified: Secondary | ICD-10-CM | POA: Diagnosis not present

## 2022-10-22 LAB — CBC
HCT: 26.2 % — ABNORMAL LOW (ref 36.0–46.0)
Hemoglobin: 8.4 g/dL — ABNORMAL LOW (ref 12.0–15.0)
MCH: 34.7 pg — ABNORMAL HIGH (ref 26.0–34.0)
MCHC: 32.1 g/dL (ref 30.0–36.0)
MCV: 108.3 fL — ABNORMAL HIGH (ref 80.0–100.0)
Platelets: 58 10*3/uL — ABNORMAL LOW (ref 150–400)
RBC: 2.42 MIL/uL — ABNORMAL LOW (ref 3.87–5.11)
RDW: 15.1 % (ref 11.5–15.5)
WBC: 7.7 10*3/uL (ref 4.0–10.5)
nRBC: 0 % (ref 0.0–0.2)

## 2022-10-22 LAB — CBC WITH DIFFERENTIAL/PLATELET
Abs Immature Granulocytes: 0.03 10*3/uL (ref 0.00–0.07)
Basophils Absolute: 0.1 10*3/uL (ref 0.0–0.1)
Basophils Relative: 1 %
Eosinophils Absolute: 0.2 10*3/uL (ref 0.0–0.5)
Eosinophils Relative: 3 %
HCT: 22.4 % — ABNORMAL LOW (ref 36.0–46.0)
Hemoglobin: 7.5 g/dL — ABNORMAL LOW (ref 12.0–15.0)
Immature Granulocytes: 1 %
Lymphocytes Relative: 13 %
Lymphs Abs: 0.9 10*3/uL (ref 0.7–4.0)
MCH: 35.2 pg — ABNORMAL HIGH (ref 26.0–34.0)
MCHC: 33.5 g/dL (ref 30.0–36.0)
MCV: 105.2 fL — ABNORMAL HIGH (ref 80.0–100.0)
Monocytes Absolute: 0.7 10*3/uL (ref 0.1–1.0)
Monocytes Relative: 10 %
Neutro Abs: 4.8 10*3/uL (ref 1.7–7.7)
Neutrophils Relative %: 72 %
Platelets: 66 10*3/uL — ABNORMAL LOW (ref 150–400)
RBC: 2.13 MIL/uL — ABNORMAL LOW (ref 3.87–5.11)
RDW: 14.6 % (ref 11.5–15.5)
WBC: 6.6 10*3/uL (ref 4.0–10.5)
nRBC: 0 % (ref 0.0–0.2)

## 2022-10-22 LAB — MAGNESIUM: Magnesium: 2 mg/dL (ref 1.7–2.4)

## 2022-10-22 LAB — BASIC METABOLIC PANEL
Anion gap: 10 (ref 5–15)
BUN: 19 mg/dL (ref 8–23)
CO2: 29 mmol/L (ref 22–32)
Calcium: 7.6 mg/dL — ABNORMAL LOW (ref 8.9–10.3)
Chloride: 93 mmol/L — ABNORMAL LOW (ref 98–111)
Creatinine, Ser: 3.61 mg/dL — ABNORMAL HIGH (ref 0.44–1.00)
GFR, Estimated: 13 mL/min — ABNORMAL LOW (ref >60)
Glucose, Bld: 120 mg/dL — ABNORMAL HIGH (ref 70–99)
Potassium: 3.7 mmol/L (ref 3.5–5.1)
Sodium: 132 mmol/L — ABNORMAL LOW (ref 135–145)

## 2022-10-22 LAB — PREPARE PLATELET PHERESIS: Unit division: 0

## 2022-10-22 LAB — TYPE AND SCREEN
ABO/RH(D): O POS
Antibody Screen: NEGATIVE

## 2022-10-22 LAB — GLUCOSE, CAPILLARY
Glucose-Capillary: 115 mg/dL — ABNORMAL HIGH (ref 70–99)
Glucose-Capillary: 137 mg/dL — ABNORMAL HIGH (ref 70–99)

## 2022-10-22 LAB — BPAM PLATELET PHERESIS: Unit Type and Rh: 6200

## 2022-10-22 MED ORDER — DARBEPOETIN ALFA 40 MCG/0.4ML IJ SOSY
40.0000 ug | PREFILLED_SYRINGE | INTRAMUSCULAR | Status: DC
  Administered 2022-10-22: 40 ug via SUBCUTANEOUS
  Filled 2022-10-22 (×2): qty 0.4

## 2022-10-22 MED ORDER — SODIUM CHLORIDE 0.9% IV SOLUTION
Freq: Once | INTRAVENOUS | Status: AC
  Administered 2022-10-22: 10 mL/h via INTRAVENOUS

## 2022-10-22 MED ORDER — FENTANYL CITRATE PF 50 MCG/ML IJ SOSY
PREFILLED_SYRINGE | INTRAMUSCULAR | Status: AC
  Administered 2022-10-22: 50 ug via INTRAVENOUS
  Filled 2022-10-22: qty 1

## 2022-10-22 MED ORDER — FENTANYL CITRATE PF 50 MCG/ML IJ SOSY
PREFILLED_SYRINGE | INTRAMUSCULAR | Status: AC
  Administered 2022-10-22: 50 ug
  Filled 2022-10-22: qty 1

## 2022-10-22 MED ORDER — CHLORHEXIDINE GLUCONATE CLOTH 2 % EX PADS: 6.0000 | MEDICATED_PAD | Freq: Every day | CUTANEOUS | Status: DC

## 2022-10-22 MED ORDER — SODIUM CHLORIDE 3 % IN NEBU
4.0000 mL | INHALATION_SOLUTION | RESPIRATORY_TRACT | Status: AC
  Administered 2022-10-23: 4 mL via RESPIRATORY_TRACT
  Filled 2022-10-22: qty 4

## 2022-10-22 MED ORDER — PANTOPRAZOLE SODIUM 40 MG PO TBEC
40.0000 mg | DELAYED_RELEASE_TABLET | Freq: Every day | ORAL | Status: DC
  Administered 2022-10-22 – 2022-10-27 (×5): 40 mg via ORAL
  Filled 2022-10-22 (×4): qty 1

## 2022-10-22 MED ORDER — FENTANYL CITRATE PF 50 MCG/ML IJ SOSY: 50.0000 ug | PREFILLED_SYRINGE | Freq: Once | INTRAMUSCULAR | Status: AC

## 2022-10-22 MED ORDER — SEVELAMER CARBONATE 800 MG PO TABS
800.0000 mg | ORAL_TABLET | Freq: Three times a day (TID) | ORAL | Status: DC
  Administered 2022-10-23 – 2022-10-27 (×12): 800 mg via ORAL
  Filled 2022-10-22 (×12): qty 1

## 2022-10-22 NOTE — Progress Notes (Signed)
This RN called from 2H to bedside to assess L groin site. Previously soft site now firm and hematoma expanded to groin with clear markings. Attempted to place fem stop, however, hematoma immediately displaced around femstop; femstop ineffective. This RN notified CCM and vascular to assess. Per 6E RNs, pressure was held on site upwards of 45 minutes with no resolve.  Vascular and CCM at bedside to assess. of Fentanyl given by this RN for pain and discomfort while vascular held pressure. Per CCM, 6E RN to transfuse 1U platelet. This RN called SWOT nurse to follow up and aid nurses as needed.   No further interventions at this time.

## 2022-10-22 NOTE — Progress Notes (Signed)
Arterial line removed from L fem at 1005. Pt. transferred to 6E in wheelchair at 1135; after ambulating to bed pt. c/o L fem pain. This RN removed compressive dressing to find hematoma present. Pressure applied to site and density reduced. Receiving nurse present during this time and aware. This RN encouraged pt. to inform nurse immediately if pain returns, or if L groin swells.

## 2022-10-22 NOTE — Progress Notes (Signed)
PT Cancellation Note  Patient Details Name: Stacey Chambers MRN: [id number] DOB: July 11, 1950   Cancelled Treatment:    Reason Eval/Treat Not Completed: Medical issues which prohibited therapy Pt experiencing L groin hematoma s/p A-line removal. RN request follow back for Evaluation tomorrow.  Dayne Dekay B. Beverely Risen PT, DPT Acute Rehabilitation Services Please use secure chat or  Call Office [phone]   Elon Alas [facility] 10/22/2022, 1:46 PM

## 2022-10-22 NOTE — Progress Notes (Addendum)
This RN was called into the patients room at 1330, patient was complaining of L groin pain. This RN assessed the patients groin and L groin appeared to have a larger and firmer hematoma than when she first arrived to the floor around 1230. This RN reached out to Katrinka Blazing, MD and called 2H RN Chrisha to assess L groin site. This RN and charge RN held pressure on groin until MD and Christie Nottingham came to the bedside at 1415. Dr. Chestine Spore at the bedside at this time as well and holding pressure and applied pressure dressing. Katrinka Blazing, MD ordered to transfuse 1U of platelets- see blood admin. Vital signs stable. This RN will continue to monitor L groin hematoma site.

## 2022-10-22 NOTE — Progress Notes (Signed)
eLink Physician-Brief Progress Note Patient Name: Stacey Chambers DOB: 1950-12-07 MRN: [id number]   Date of Service  10/22/2022  HPI/Events of Note  89F with ESRD on HD, DMII, HFpEF and cirrhosis who is brought to the hospital with seizure like activity before HD for hyperkalemia and complete heart block yesterday.   Now improved, failed lokelma, latest K 3.5.  eICU Interventions  Continue observation, no intervention indicated.     Intervention Category Minor Interventions: Electrolytes abnormality - evaluation and management  Annie Saephan 10/22/2022, 12:31 AM

## 2022-10-22 NOTE — Progress Notes (Addendum)
Called for worsening L groin hematoma after femoral A line removal.  Compartment is soft but definite hematoma tracking along to medial pocket.  Was persistent despite pressure x 45+ mins. Dr. Chestine Spore ([facility]) eval'd at bedside and adjust where we were holding pressure and gave recs, appreciate help.  Will give some platelets (patient consented for blood products), NPO, lay flat for now, appreciate nursing help.  Myrla Halsted MD PCCM

## 2022-10-22 NOTE — Progress Notes (Signed)
This RN paged Chestine Spore, MD at [phone] to come reassess the patients L groin hematoma because hematoma appears to be moving down the interior region of her L thigh area. Clark MD returned RN's page at [phone] and stated to continue to monitor site and keep patient NPO for now.

## 2022-10-22 NOTE — Progress Notes (Signed)
   NAME:  Stacey Chambers, MRN:  [id number], DOB:  25-Nov-1950, LOS: 2 ADMISSION DATE:  10/20/2022, CONSULTATION DATE:  10/22/22 REFERRING MD:  EDP, CHIEF COMPLAINT:  sz, bradycardia   History of Present Illness:  Stacey Chambers is a 72 y.o. F with PMH significant for Type 2 DM, ESRD, thrombocytopenia and iron deficiency anemia, PUD, HFpEF and cirrhosis who was at dialysis on the day of admission and had seizure-like activity before HD was initiated.  No reported duration and descriptor of this activity, history taken from EMS as patient has no emergency contacts listed and is intubated.   After sz activity she went unresponsive but had a pulse and was breathing, en route with EMS had about 20 seconds of asystole, does not sounds like chest compressions were started, HR returned and she was bradycardic in the 30's after atropine.    In the ED she was initially mentating, through remained bradycardic and became increasingly short of breath, so was intubated and transcutaneously paced.  Cardiology saw patient, as external pacing was not capturing, this was discontinued.  Labs significant for K 6.3, Na 129, platelets 91, trop 13.   Her HR and blood pressure responded well to dopamine and she was seen by nephrology.  PCCM consulted for admission  Pertinent  Medical History   has a past medical history of Anemia, Diabetes mellitus without complication (HCC), Hypertension, and Vision loss. Cirrhosis HFpEF, ESRD, PUD  Significant Hospital Events: Including procedures, antibiotic start and stop dates in addition to other pertinent events   4/19 admit to ICU after possible sz, episode of asystole and bradycardia in the setting of hyperkalemia, intubated   Interim History / Subjective:  No events. Has productive cough. Fever curve improved. Received HD again last night.  Objective   Blood pressure 137/68, pulse 93, temperature 99 F (37.2 C), temperature source Oral, resp. rate 15, height 5' (1.524  m), weight 62.6 kg, SpO2 99 %.    Vent Mode: CPAP;PSV FiO2 (%):  [40 %] 40 % PEEP:  [5 cmH20] 5 cmH20 Pressure Support:  [10 cmH20] 10 cmH20   Intake/Output Summary (Last 24 hours) at 10/22/2022 0741 Last data filed at 10/22/2022 0400 Gross per 24 hour  Intake 428.37 ml  Output 2000 ml  Net -1571.63 ml    Filed Weights   10/21/22 1436 10/21/22 1837 10/22/22 0500  Weight: 65.4 kg 63.5 kg 62.6 kg   No distress MMM Ext warm Aox3 Weak On a bit of oxygen still Abd soft Lungs with some rhonci  Resolved Hospital Problem list     Assessment & Plan:    Bradycardia, Complete heart block - thought related to hyperkalemia. Resolved - Tele monitoring  New onset sz activity EEG, CT head neg, related to low flow state. No further workup indicated.  Fevers, leukocytosis, abnormal CXR Suspect aspiration.  Pct 3 - Zosyn vs augmentin x 7 days  Acute Hypercarbic/Hypoxemic Respiratory Failure - in context of hemodynamic collapse; resolved - Wean O2 for sats > 90%, on no home O2  ESRD  Hyperkalemia - iHD per nephrology  Thrombocytopenia  -chronic and stable, ranges 50-100 as OP -with H/H down slightly will hold heparin ppx today  Type 2 DM -SSI  GERD- resume home PPI  Muscular deconditioning- PT eval  Stable for transfer to tele, appreciate TRH taking over starting 10/23/22  Remaining issues - O2 wean - PT eval - Stable H/H  Myrla Halsted MD PCCM

## 2022-10-22 NOTE — Progress Notes (Addendum)
[facility] Progress Note  Subjective: had HD again yesterday w/ another 2 L off and K+ down to 3.7 this am. Pt w/o new c/o's. For dc from ICU.   Vitals:   10/22/22 1000 10/22/22 1100 10/22/22 1125 10/22/22 1141  BP: (!) 114/52 121/60  125/60  Pulse: 78 88  87  Resp: (!) Temp:   98.8 F (37.1 C)   TempSrc:   Oral   SpO2: 97% 97%  97%  Weight:      Height:        Exam:  alert, nad , nasal O2  no jvd  Chest cta bilat  Cor reg no RG  Abd soft ntnd no ascites   Ext no sig LE edema   Alert, NF, ox3    LUA AVF+bruit      Home meds include - norvasc 2.5, nexium, synthroid, losartan 25-50mg  qd, metoprolol  xl qd, remeron, prednisone taper, renvela 800 ac tid, prns        OP HD: AF MWF 3.5h  400/1.5  62.2kg   2/2 bath  dry wt pend   AVF  Hep none - last HD 4/17, post wt 62.4kg - mircera 50 mcg IV  q2 wks, last 4/8, due 4/22     CXR - bilat vasc congestion, early edema appearance     Assessment/ Plan: Bradycardia/ complete heart block/ hyperkalemia - attempts at external pacing were not successful. Seen by cardiology and improved w/ dopamine gtt and treatment of hyperkalemia. No plans for pacemaker.  Hypotension - was briefly requiring vasopressor support in ICU, now resolved Hyperkalemia - K+ improved after temp measures and 1st HD, then spiked again to 5.9 yesterday so she had additional HD w/ low K+ bath and today K+ is down at 3.7. ESRD - on HD MWF. No missed OP HD. Had HD here 4/19 and 4/20. Resume HD tomorrow on schedule.  HTN/ volume - admit CXR showed early edema. Had HD x 2 here w/ 4 L UF in total and is down to dry wt today.  Anemia esrd - Hb down now 8-10 and esa due, will order for darbepoetin 40 mcg weekly sq on Sundays while here.  MBD ckd - CCa in range and phos a bit high. Will resume renvela as binder. Not on vdra AHRF - was on ventilator about 24 hrs. Resolved.  DM2 - per pmd     Vinson Moselle MD [facility] 10/22/2022, 1:24  PM  Recent Labs  Lab 10/20/22 1737 10/21/22 0415 10/21/22 1118 10/21/22 2141 10/22/22 0349  HGB  --  9.9*  --   --  8.4*  ALBUMIN 3.6  --   --   --   --   CALCIUM 8.2* 7.3* 7.7*  --  7.6*  PHOS 4.3 7.0*  --   --   --   CREATININE 1.99* 3.50* 4.18*  --  3.61*  K 3.3* 5.4* 5.9* 3.5 3.7    No results for input(s): "IRON", "TIBC", "FERRITIN" in the last 168 hours. Inpatient medications:  Chlorhexidine Gluconate Cloth  6 each Topical Q0600   pantoprazole  40 mg Oral Daily    sodium chloride     norepinephrine (LEVOPHED) Adult infusion Stopped (10/22/22 0458)   piperacillin-tazobactam (ZOSYN)  IV Stopped (10/22/22 0643)   sodium chloride, acetaminophen, docusate, ondansetron (ZOFRAN) IV, mouth rinse, polyethylene glycol

## 2022-10-22 NOTE — Progress Notes (Signed)
PT Cancellation Note  Patient Details Name: Stacey Chambers MRN: [id number] DOB: 12/29/1950   Cancelled Treatment:    Reason Eval/Treat Not Completed: (P) Patient not medically ready Pt has bilateral femoral lines that are scheduled to be removed this morning. PT will follow back this afternoon for Evaluation as appropriate.  Kelita Wallis B. Beverely Risen PT, DPT Acute Rehabilitation Services Please use secure chat or  Call Office [phone]    Elon Alas [facility] 10/22/2022, 7:49 AM

## 2022-10-22 NOTE — Consult Note (Signed)
Hospital Consult    Reason for Consult:  Left groin bleed Referring Physician:  Critical care MRN #:  [id number]  History of Present Illness: This is a 72 y.o. female with history of diabetes, end-stage renal disease, thrombocytopenia that vascular surgery was called for left groin bleed.  Patient was admitted with bradycardia /heart block thought to be related to hyperkalemia.  She had an A-line in the left groin.  This was removed earlier today in the ICU and she was transferred to the floor.  When she got to the floor noted to have a expanding hematoma in her left groin and fem stop was placed.  Past Medical History:  Diagnosis Date   Anemia    Diabetes mellitus without complication (HCC)    Hypertension    Vision loss     Past Surgical History:  Procedure Laterality Date   AV FISTULA PLACEMENT Left 12/23/2021   Procedure: INSERTION OF LEFT ARM BRACHIOCEPHALIC ARTERIOVENOUS FISTULA;  Surgeon: Cephus Shelling, MD;  Location: [facility] OR;  Service: Vascular;  Laterality: Left;   BACK SURGERY     COLONOSCOPY     IR FLUORO GUIDE CV LINE RIGHT  12/16/2021   IR US GUIDE VASC ACCESS RIGHT  12/16/2021   UPPER GASTROINTESTINAL ENDOSCOPY      No Known Allergies  Prior to Admission medications   Medication Sig Start Date End Date Taking? Authorizing Provider  ACCU-CHEK GUIDE test strip  02/01/21   [provider]  Accu-Chek Softclix Lancets lancets  01/31/21   [provider]  acetaminophen (TYLENOL) 500 MG tablet Take 500 mg by mouth every 8 (eight) hours as needed for moderate pain.    [provider]  albuterol (VENTOLIN HFA) 108 (90 Base) MCG/ACT inhaler Inhale 1 puff into the lungs every 4 (four) hours as needed for wheezing or shortness of breath. 09/14/22   [provider]  amLODipine (NORVASC) 2.5 MG tablet Take 2.5 mg by mouth daily. 11/25/21   [provider]  B Complex-C-Zn-Folic Acid (DIALYVITE 800 WITH ZINC) 0.8 MG TABS Take 1 tablet by  mouth at bedtime. 03/07/22   [provider]  esomeprazole (NEXIUM) 40 MG capsule Take 1 capsule (40 mg total) by mouth 2 (two) times daily. Patient not taking: Reported on 09/16/2022 09/13/21   Mansouraty, Netty Starring., MD  ferrous gluconate (FERGON) 324 MG tablet Take 1 tablet (324 mg total) by mouth daily with breakfast. Patient not taking: Reported on 09/16/2022 09/13/21   Mansouraty, Netty Starring., MD  levothyroxine (SYNTHROID) 25 MCG tablet Take 25 mcg by mouth daily. 11/17/21   [provider]  lidocaine-prilocaine (EMLA) cream Apply 1 Application topically 3 (three) times a week.    [provider]  losartan (COZAAR) 25 MG tablet Take 25 mg by mouth at bedtime. 07/06/22   [provider]  losartan (COZAAR) 50 MG tablet Take 50 mg by mouth daily.    [provider]  metoprolol succinate (TOPROL-XL) 50 MG 24 hr tablet Take 50 mg by mouth daily. 10/20/21   [provider]  mirtazapine (REMERON) 15 MG tablet Take 15 mg by mouth at bedtime. 10/10/22   [provider]  mirtazapine (REMERON) 7.5 MG tablet Take 7.5 mg by mouth at bedtime.    [provider]  predniSONE (DELTASONE) 10 MG tablet Take  daily for 3days,Take  daily for 3days,Take  daily for 3days,Take  daily for 3days, then stop 09/22/22   Rolly Salter, MD  sevelamer carbonate (RENVELA) 800 MG  tablet Take 800 mg by mouth 3 (three) times daily. 03/09/22   [provider]    Social History   Socioeconomic History   Marital status: Married    Spouse name: Not on file   Number of children: Not on file   Years of education: Not on file   Highest education level: Not on file  Occupational History   Not on file  Tobacco Use   Smoking status: Never   Smokeless tobacco: Never  Vaping Use   Vaping Use: Never used  Substance and Sexual Activity   Alcohol use: Never    Comment: once in awhile   Drug use: Not Currently    Types: Marijuana    Comment:  Use of THC tea 1-2 times per week   Sexual activity: Not on file  Other Topics Concern   Not on file  Social History Narrative   Not on file   Social Determinants of Health   Financial Resource Strain: Not on file  Food Insecurity: Not on file  Transportation Needs: Not on file  Physical Activity: Not on file  Stress: Not on file  Social Connections: Not on file  Intimate Partner Violence: Not on file     Family History  Problem Relation Age of Onset   Diabetes Brother    Colon cancer Neg Hx    Esophageal cancer Neg Hx    Stomach cancer Neg Hx    Pancreatic cancer Neg Hx    Inflammatory bowel disease Neg Hx    Liver disease Neg Hx    Rectal cancer Neg Hx    Colon polyps Neg Hx     ROS: [x]  Positive   [ ]  Negative   [ ]  All sytems reviewed and are negative  Cardiovascular: []  chest pain/pressure []  palpitations []  SOB lying flat []  DOE []  pain in legs while walking []  pain in legs at rest []  pain in legs at night []  non-healing ulcers []  hx of DVT []  swelling in legs  Pulmonary: []  productive cough []  asthma/wheezing []  home O2  Neurologic: []  weakness in []  arms []  legs []  numbness in []  arms []  legs []  hx of CVA []  mini stroke [] difficulty speaking or slurred speech []  temporary loss of vision in one eye []  dizziness  Hematologic: []  hx of cancer []  bleeding problems []  problems with blood clotting easily  Endocrine:   []  diabetes []  thyroid disease  GI []  vomiting blood []  blood in stool  GU: []  CKD/renal failure []  HD--[]  M/W/F or []  T/T/S []  burning with urination []  blood in urine  Psychiatric: []  anxiety []  depression  Musculoskeletal: []  arthritis []  joint pain  Integumentary: []  rashes []  ulcers  Constitutional: []  fever []  chills   Physical Examination  Vitals:   10/22/22 1454 10/22/22 1522  BP: (!) 147/79 (!) 110/50  Pulse: 83 79  Resp: 16 16  Temp: 98.6 F (37 C) 98.6 F (37 C)  SpO2: 100% 100%   Body  mass index is 26.95 kg/m.  General:  in bed Gait: Not observed HENT: WNL, normocephalic Pulmonary: normal non-labored breathing Cardiac: regular, without  Murmurs, rubs or gallops Abdomen:  soft, NT/ND Vascular Exam/Pulses: Left femoral pulse palpable Left DP palpable Left groin with notable hematoma and ecchymosis into the thigh Extremities: without ischemic changes Musculoskeletal: no muscle wasting or atrophy  Neurologic: A&O X 3; Appropriate Affect ; SENSATION: normal; MOTOR FUNCTION:  moving all extremities equally. Speech is fluent/normal   CBC  Component Value Date/Time   WBC 7.7 10/22/2022 0349   RBC 2.42 (L) 10/22/2022 0349   HGB 8.4 (L) 10/22/2022 0349   HCT 26.2 (L) 10/22/2022 0349   PLT 58 (L) 10/22/2022 0349   MCV 108.3 (H) 10/22/2022 0349   MCH 34.7 (H) 10/22/2022 0349   MCHC 32.1 10/22/2022 0349   RDW 15.1 10/22/2022 0349   LYMPHSABS 0.9 09/21/2022 0434   MONOABS 0.7 09/21/2022 0434   EOSABS 0.0 09/21/2022 0434   BASOSABS 0.0 09/21/2022 0434    BMET    Component Value Date/Time   NA 132 (L) 10/22/2022 0349   K 3.7 10/22/2022 0349   CL 93 (L) 10/22/2022 0349   CO2 29 10/22/2022 0349   GLUCOSE 120 (H) 10/22/2022 0349   BUN 19 10/22/2022 0349   CREATININE 3.61 (H) 10/22/2022 0349   CALCIUM 7.6 (L) 10/22/2022 0349   GFRNONAA 13 (L) 10/22/2022 0349    COAGS: Lab Results  Component Value Date   INR 1.4 (H) 09/15/2022   INR 1.3 (H) 12/10/2021   INR 1.4 (H) 12/08/2021     Non-Invasive Vascular Imaging:   N/a  ASSESSMENT/PLAN: This is a 72 y.o. female with history of diabetes, end-stage renal disease, thrombocytopenia that vascular surgery was called for left groin bleed.  Patient was admitted with bradycardia/heart block thought to be related to hyperkalemia.  She had an A-line in the left groin.  This was removed earlier today in the ICU and she was transferred to the floor.  We held pressure for over 1 hour at the bedside upstairs and a  pressure dressing has been applied.  Will continue to monitor.  I have discussed with the Myrla Halsted with critical care and will give her one unit of platelets for her thrombocytopenia.  I discussed she needs to be on bedrest for the rest of the evening with her leg straight.  I will check on her later.  Please keep NPO.  If this continues to expand there is some chance she would need operative intervention which I discussed with the family.  Cephus Shelling, MD [facility] Office: [phone]  Cephus Shelling

## 2022-10-23 ENCOUNTER — Other Ambulatory Visit: Payer: Self-pay

## 2022-10-23 ENCOUNTER — Inpatient Hospital Stay (HOSPITAL_COMMUNITY): Payer: 59

## 2022-10-23 DIAGNOSIS — E611 Iron deficiency: Secondary | ICD-10-CM

## 2022-10-23 DIAGNOSIS — N185 Chronic kidney disease, stage 5: Secondary | ICD-10-CM | POA: Diagnosis not present

## 2022-10-23 DIAGNOSIS — I442 Atrioventricular block, complete: Secondary | ICD-10-CM | POA: Diagnosis not present

## 2022-10-23 DIAGNOSIS — E875 Hyperkalemia: Secondary | ICD-10-CM | POA: Diagnosis not present

## 2022-10-23 DIAGNOSIS — R001 Bradycardia, unspecified: Secondary | ICD-10-CM | POA: Diagnosis not present

## 2022-10-23 LAB — CBC
HCT: 22.5 % — ABNORMAL LOW (ref 36.0–46.0)
Hemoglobin: 7.5 g/dL — ABNORMAL LOW (ref 12.0–15.0)
MCH: 35 pg — ABNORMAL HIGH (ref 26.0–34.0)
MCHC: 33.3 g/dL (ref 30.0–36.0)
MCV: 105.1 fL — ABNORMAL HIGH (ref 80.0–100.0)
Platelets: 69 10*3/uL — ABNORMAL LOW (ref 150–400)
RBC: 2.14 MIL/uL — ABNORMAL LOW (ref 3.87–5.11)
RDW: 14.8 % (ref 11.5–15.5)
WBC: 6.5 10*3/uL (ref 4.0–10.5)
nRBC: 0 % (ref 0.0–0.2)

## 2022-10-23 LAB — BPAM PLATELET PHERESIS
Blood Product Expiration Date: 202404222359
ISSUE DATE / TIME: 202404211429

## 2022-10-23 LAB — RENAL FUNCTION PANEL
Albumin: 2.6 g/dL — ABNORMAL LOW (ref 3.5–5.0)
Anion gap: 15 (ref 5–15)
BUN: 43 mg/dL — ABNORMAL HIGH (ref 8–23)
CO2: 25 mmol/L (ref 22–32)
Calcium: 8.1 mg/dL — ABNORMAL LOW (ref 8.9–10.3)
Chloride: 97 mmol/L — ABNORMAL LOW (ref 98–111)
Creatinine, Ser: 6.65 mg/dL — ABNORMAL HIGH (ref 0.44–1.00)
GFR, Estimated: 6 mL/min — ABNORMAL LOW (ref >60)
Glucose, Bld: 89 mg/dL (ref 70–99)
Phosphorus: 6.8 mg/dL — ABNORMAL HIGH (ref 2.5–4.6)
Potassium: 4 mmol/L (ref 3.5–5.1)
Sodium: 137 mmol/L (ref 135–145)

## 2022-10-23 LAB — PREPARE PLATELET PHERESIS

## 2022-10-23 MED ORDER — PIPERACILLIN-TAZOBACTAM IN DEX 2-0.25 GM/50ML IV SOLN
2.2500 g | Freq: Three times a day (TID) | INTRAVENOUS | Status: DC
  Administered 2022-10-23 – 2022-10-24 (×4): 2.25 g via INTRAVENOUS
  Filled 2022-10-23 (×7): qty 50

## 2022-10-23 MED ORDER — BENZONATATE 100 MG PO CAPS
200.0000 mg | ORAL_CAPSULE | Freq: Three times a day (TID) | ORAL | Status: DC | PRN
  Administered 2022-10-23 – 2022-10-25 (×6): 200 mg via ORAL
  Filled 2022-10-23 (×6): qty 2

## 2022-10-23 MED ORDER — GUAIFENESIN 100 MG/5ML PO LIQD
5.0000 mL | ORAL | Status: DC | PRN
  Administered 2022-10-23 (×2): 5 mL via ORAL
  Filled 2022-10-23 (×2): qty 10

## 2022-10-23 MED ORDER — DM-GUAIFENESIN ER 30-600 MG PO TB12
1.0000 | ORAL_TABLET | Freq: Two times a day (BID) | ORAL | Status: DC
  Administered 2022-10-23 – 2022-10-27 (×9): 1 via ORAL
  Filled 2022-10-23 (×11): qty 1

## 2022-10-23 MED ORDER — TRAZODONE HCL 50 MG PO TABS
50.0000 mg | ORAL_TABLET | Freq: Once | ORAL | Status: AC
  Administered 2022-10-23: 50 mg via ORAL
  Filled 2022-10-23: qty 1

## 2022-10-23 NOTE — Evaluation (Signed)
Physical Therapy Evaluation Patient Details Name: Stacey Chambers MRN: [id number] DOB: 1950/11/27 Today's Date: 10/23/2022  History of Present Illness  Stacey Chambers is a 72 y.o. F who was admitted for seizure-like activity before HD on 10/20/22 in addition to bradycardia and 20 sec of asystole. Femoral line was placed in L groin and upon removal pt with extensive bleeding and large hematoma. WUJ:WJXB 2 DM, ESRD, thrombocytopenia and iron deficiency anemia, PUD, HFpEF, back surgery, and cirrhosis   Clinical Impression  Pt admitted with above. Pt with questionable living situation and support as pt with no contacts however reports living with a female roommate in which RN reports came to visit her yesterday. Per pt she was indep with amb and ADLs with exception of stockings. At baseline roommate does cooking, cleaning, driving, and grocery shopping. Roommate does work but pt reports he can come home at any time. Suspect once pt feeling better pt with progress back to mod I level of function. Pt to benefit from RW at this time due to impaired balance and work of breathing during ambulation. Acute PT to cont to follow.       Recommendations for follow up therapy are one component of a multi-disciplinary discharge planning process, led by the attending physician.  Recommendations may be updated based on patient status, additional functional criteria and insurance authorization.  Follow Up Recommendations       Assistance Recommended at Discharge Intermittent Supervision/Assistance  Patient can return home with the following  A little help with bathing/dressing/bathroom;A little help with walking and/or transfers;Assistance with cooking/housework;Direct supervision/assist for medications management;Direct supervision/assist for financial management;Assist for transportation;Help with stairs or ramp for entrance    Equipment Recommendations BSC/3in1;Rolling walker (2 wheels)  Recommendations for  Other Services       Functional Status Assessment Patient has had a recent decline in their functional status and demonstrates the ability to make significant improvements in function in a reasonable and predictable amount of time.     Precautions / Restrictions Precautions Precautions: Fall Precaution Comments: L groin bruising Restrictions Weight Bearing Restrictions: No      Mobility  Bed Mobility               General bed mobility comments: pt received sitting up on EOB    Transfers Overall transfer level: Needs assistance Equipment used: 1 person hand held assist Transfers: Sit to/from Stand Sit to Stand: Min guard           General transfer comment: pt reports "hold onto me. I'm lightheaded." BP stable at 121/46.    Ambulation/Gait Ambulation/Gait assistance: Min assist Gait Distance (Feet): 10 Feet (x1, 20x1) Assistive device: 1 person hand held assist Gait Pattern/deviations: Step-to pattern, Decreased stride length, Wide base of support Gait velocity: dec Gait velocity interpretation: <1.31 ft/sec, indicative of household ambulator   General Gait Details: pt with decreased step height and length and wide base of support. Pt reports typically walking with bigger steps, pt with noted increased work of breathing, SpO2 >95% on RA  Stairs            Wheelchair Mobility    Modified Rankin (Stroke Patients Only)       Balance Overall balance assessment: Mild deficits observed, not formally tested (pt sat EOB and ate breakfast without difficulty, pt requesting assist from PT during ambulation due to "I just feel weak")  Pertinent Vitals/Pain Pain Assessment Pain Assessment: No/denies pain    Home Living Family/patient expects to be discharged to:: Private residence Living Arrangements: Non-relatives/Friends (female roommate) Available Help at Discharge: Friend(s);Available 24  hours/day Type of Home: House Home Access: Stairs to enter Entrance Stairs-Rails: Can reach both Entrance Stairs-Number of Steps: 2   Home Layout: One level Home Equipment: None      Prior Function Prior Level of Function : Needs assist             Mobility Comments: amb without AD ADLs Comments: pt typical can dress/bath self, needs assist with hosiery sometimes. roommate does the  cooking, cleaning, driving     Hand Dominance   Dominant Hand: Right    Extremity/Trunk Assessment   Upper Extremity Assessment Upper Extremity Assessment: Generalized weakness    Lower Extremity Assessment Lower Extremity Assessment: Generalized weakness    Cervical / Trunk Assessment Cervical / Trunk Assessment: Other exceptions Cervical / Trunk Exceptions: h/o back surgery  Communication   Communication: Prefers language other than English (spanish interpreter used [phone])  Cognition Arousal/Alertness: Awake/alert Behavior During Therapy: Anxious Overall Cognitive Status: No family/caregiver present to determine baseline cognitive functioning                                 General Comments: pt with no contacts in chart and reports her 2 daughters don't speak to her. Unsure of patients baseline level of cognition. Pt is oriented x4. Pt emotional on phone with friend but unable to decifer what she was saying as it was in spanish. Pt able to follow all commands however becomes very anxious over cough        General Comments General comments (skin integrity, edema, etc.): VSS, PT had to assist pt with donning of L sock due to inability to bend at L hip    Exercises     Assessment/Plan    PT Assessment Patient needs continued PT services  PT Problem List Decreased strength;Decreased activity tolerance;Decreased balance;Decreased mobility;Decreased safety awareness       PT Treatment Interventions DME instruction;Gait training;Stair training;Functional  mobility training;Therapeutic activities;Therapeutic exercise;Balance training;Neuromuscular re-education    PT Goals (Current goals can be found in the Care Plan section)  Acute Rehab PT Goals Patient Stated Goal: home PT Goal Formulation: With patient Time For Goal Achievement: 11/06/22 Potential to Achieve Goals: Good    Frequency Min 1X/week     Co-evaluation               AM-PAC PT "6 Clicks" Mobility  Outcome Measure Help needed turning from your back to your side while in a flat bed without using bedrails?: None Help needed moving from lying on your back to sitting on the side of a flat bed without using bedrails?: None Help needed moving to and from a bed to a chair (including a wheelchair)?: A Little Help needed standing up from a chair using your arms (e.g., wheelchair or bedside chair)?: A Little Help needed to walk in hospital room?: A Little Help needed climbing 3-5 steps with a railing? : A Lot 6 Click Score: 19    End of Session   Activity Tolerance: Other (comment) (lightheadedness) Patient left: in bed;with call bell/phone within reach;with bed alarm set (sitting EOB) Nurse Communication: Mobility status;Other (comment) (desire for cough medicine and a bath) PT Visit Diagnosis: Unsteadiness on feet (R26.81);Muscle weakness (generalized) (M62.81);Difficulty in walking, not elsewhere classified (R26.2)  Time: 0981-1914 PT Time Calculation (min) (ACUTE ONLY): 37 min   Charges:   PT Evaluation $PT Eval Moderate Complexity: 1 Mod PT Treatments $Gait Training: 8-22 mins        Lewis Shock, PT, DPT Acute Rehabilitation Services Secure chat preferred Office #: [phone]   Iona Hansen 10/23/2022, 1:23 PM

## 2022-10-23 NOTE — Progress Notes (Signed)
Patient has a congested cough.Her oxygen is 100% on room air. She is afebrile and vitals are WDL. She denies chest pain or ShOB.  Patient is requesting a medication that will assist with the cough. Margo Aye MD notified. See new orders in [facility]. I will continue to monitor patient for changes.

## 2022-10-23 NOTE — Progress Notes (Signed)
Pharmacy Antibiotic Note  Stacey Chambers is a 72 y.o. female admitted on 10/20/2022 with pneumonia secondary to aspiration event during arrest.  Pharmacy has been consulted for vancomycin and cefepime dosing. MRSA PCR negative.  Plan: Zosyn 2.25g IV q8h  Height: 5' (152.4 cm) Weight: 62.6 kg (138 lb 0.1 oz) IBW/kg (Calculated) : 45.5  Temp (24hrs), Avg:98.5 F (36.9 C), Min:98.3 F (36.8 C), Max:98.8 F (37.1 C)  Recent Labs  Lab 10/20/22 0752 10/20/22 0755 10/20/22 1737 10/21/22 0415 10/21/22 1118 10/22/22 0349 10/22/22 2146 10/23/22 0225  WBC 7.0  --   --  11.3*  --  7.7 6.6 6.5  CREATININE 4.87* 5.60* 1.99* 3.50* 4.18* 3.61*  --   --      Estimated Creatinine Clearance: 12.1 mL/min (A) (by C-G formula based on SCr of 3.61 mg/dL (H)).    No Known Allergies  Antimicrobials this admission: Vancomycin 4/20 >> 4/21 Zosyn 4/20 >>     Microbiology results: 4/20 MRSA PCR: negative  Thank you for involving pharmacy in this patient's care.  Fredonia Highland, PharmD, BCPS, [facility] Clinical Pharmacist [phone] Please check AMION for all [facility] numbers 10/23/2022

## 2022-10-23 NOTE — Progress Notes (Signed)
Patient complains of dizziness when changing positions and presents with ShOB on exertion. Orthostatic vitals taken. See chart.

## 2022-10-23 NOTE — Progress Notes (Addendum)
Subjective: And examined in room, for dialysis today, complains of cough but no shortness of breath..  Reports some chronic back pain with history of back surgery in the past  Objective Vital signs in last 24 hours: Vitals:   10/23/22 0011 10/23/22 0020 10/23/22 0050 10/23/22 0421  BP:  (!) 146/72 (!) 143/64 (!) 129/59  Pulse:  88 85 73  Resp:    16  Temp:    98.3 F (36.8 C)  TempSrc:    Oral  SpO2: 100% 100% 99% 100%  Weight:      Height:       Weight change:   Physical Exam: General: Alert adult female NAD Heart: RRR no MRG Lungs: Bilateral coarse breath sounds nonlabored breathing Abdomen: NABS soft NTND no ascites Extremities: No pedal edema, left groin hematoma no active bleed noted dialysis Access: LUA AV aVF +bruit   Home meds include - norvasc 2.5, nexium, synthroid, losartan 25-50mg  qd, metoprolol  xl qd, remeron, prednisone taper, renvela 800 ac tid, prns    OP HD : AF MWF 3.5h  400/1.5  62.2kg   2/2 bath  dry wt pend   AVF  Hep none - last HD 4/17, post wt 62.4kg - mircera 50 mcg IV  q2 wks, last 4/8, due 4/22  Problem/Plan: Bradycardia/ complete heart block/ hyperkalemia -resolved after^ K treated, attempts at external pacing were not successful. Seen by cardiology and improved w/ dopamine gtt and treatment of hyperkalemia. No plans for pacemaker.  Hypotension - was briefly requiring vasopressor support in ICU, transferred to floor this weekend now resolved, Hyperkalemia - K+ improved after temp measures and 1st HD, then spiked again to 5.9 4/20  so she had additional HD w/ low K+ bath = 4/21=K+ =3.7./Reported eating a lot of potatoes as an outpatient ESRD - on HD MWF. No missed OP HD. Had HD here 4/19 and 4/20.now HD on schedule, HD today HTN/volume -admit C XR= early edema HD x 2 =4 L UF total down to dry wt (? Bed wt accurate) UF today 2 to 3 L as tolerated with current cough Anemia -Hgb 7.5 this a.m. yesterday 8.4 , 40 mcg Aranesp yesterday given, transfuse  for Hgb under 7/VVS following groin hematoma noted platelets given by CCM yesterday= PLT 69 this a.m.(chronically low as outpatient) Secondary hyperparathyroidism -phosphorus 7.0 corrected calcium at goal stressed Renvela compliance Back pain history of back surgery in the past= plan per admit  Lenny Pastel, PA-C [facility] [phone] 10/23/2022,8:22 AM  LOS: 3 days   Patient seen and examined, agree with above note with above modifications. Seen on HD-  always seems to be in a little distress even as OP-  hgb falling seems to be most pressing issue at this point-  k is better -  routine hd today  Annie Sable, MD 10/23/2022    Labs: Basic Metabolic Panel: Recent Labs  Lab 10/20/22 1737 10/21/22 0415 10/21/22 1118 10/21/22 2141 10/22/22 0349  NA 131* 128* 130*  --  132*  K 3.3* 5.4* 5.9* 3.5 3.7  CL 91* 92* 89*  --  93*  CO2 --  29  GLUCOSE 129* 139* 106*  --  120*  BUN --  19  CREATININE 1.99* 3.50* 4.18*  --  3.61*  CALCIUM 8.2* 7.3* 7.7*  --  7.6*  PHOS 4.3 7.0*  --   --   --    Liver Function Tests: Recent Labs  Lab 10/20/22 1737  AST 43*  ALT 22  ALKPHOS 118  BILITOT 1.2  PROT 8.8*  ALBUMIN 3.6   No results for input(s): "LIPASE", "AMYLASE" in the last 168 hours. No results for input(s): "AMMONIA" in the last 168 hours. CBC: Recent Labs  Lab 10/20/22 0752 10/20/22 0755 10/21/22 0415 10/22/22 0349 10/22/22 2146 10/23/22 0225  WBC 7.0  --  11.3* 7.7 6.6 6.5  NEUTROABS  --   --   --   --  4.8  --   HGB 11.3*   < > 9.9* 8.4* 7.5* 7.5*  HCT 33.9*   < > 30.1* 26.2* 22.4* 22.5*  MCV 106.3*  --  107.1* 108.3* 105.2* 105.1*  PLT 91*  --  95* 58* 66* 69*   < > = values in this interval not displayed.   Cardiac Enzymes: No results for input(s): "CKTOTAL", "CKMB", "CKMBINDEX", "TROPONINI" in the last 168 hours. CBG: Recent Labs  Lab 10/21/22 1654 10/21/22 1954 10/21/22 2357 10/22/22 0348 10/22/22 0757   GLUCAP 114* 220* 123* 115* 137*    Studies/Results: No results found. Medications:  sodium chloride     norepinephrine (LEVOPHED) Adult infusion Stopped (10/22/22 0458)   piperacillin-tazobactam (ZOSYN)  IV 2.25 g (10/23/22 0547)    darbepoetin (ARANESP) injection - DIALYSIS  40 mcg Subcutaneous Q Sun-1800   pantoprazole  40 mg Oral Daily   sevelamer carbonate  800 mg Oral TID WC

## 2022-10-23 NOTE — Progress Notes (Signed)
[facility]                                                                               Stacey Chambers, is a 72 y.o. female, DOB - 02/01/1951, AVW:[id number] Admit date - 10/20/2022    Outpatient Primary MD for the patient is Sagardia, Stacey Kempf, MD  LOS - 3  days    Brief summary   Stacey Chambers is a 72 y.o. F with PMH significant for Type 2 DM, ESRD, thrombocytopenia and iron deficiency anemia, PUD, HFpEF and cirrhosis who was at dialysis on the day of admission and had seizure-like activity before HD was initiated. After sz activity she went unresponsive but had a pulse and was breathing, en route with EMS had about 20 seconds of asystole, does not sounds like chest compressions were started, HR returned and she was bradycardic in the 30's after atropine. In the ED she was initially mentating, through remained bradycardic and became increasingly short of breath, so was intubated and transcutaneously paced. Cardiology saw patient, as external pacing was not capturing, this was discontinued. Her HR and blood pressure responded well to dopamine and she was seen by nephrology.  Sh was admitted by [facility] and transferred to [facility] on 4/22.     Assessment & Plan    Assessment and Plan:  Bradycardia, CHB ? From hyperkalemia:  Resolved.     Hypotension:  Briefly required vasopressors,  Off any pressors today.   Left groin hematoma at the site of the arterial line which was removed.  Vascular surgery on board. Appreciate recommendations.  Ambulate as tolerated.    New on set seizures? From hyperkalemia/ bradycardia.  EEG and CT neg.    Acute hypoxemic respiratory failure secondary to aspiration pneumonia.  On zosyn, possible transition to Augmentin to complete the course.  Currently on RA.  Patient currently on mucinex and tessalon.    ESRD ON HD  On renvela 800 mg tid.    H/o PUD;  Resume PPI.    Chronic diastolic heart failure:  Fluid management with HD.     Iron deficiency anemia;  On aranesp q weekly.   Chronic Thrombocytopenia:   Platelets Stabilized.  S/p 1 unit of platelets transfused.     Estimated body mass index is 26.95 kg/m as calculated from the following:   Height as of this encounter: 5' (1.524 m).   Weight as of this encounter: 62.6 kg.  Code Status: full code.  DVT Prophylaxis:  SCDs Start: 10/20/22 0847   Level of Care: Level of care: Telemetry Medical Family Communication: none at bedside.   Disposition Plan:     Remains inpatient appropriate:  pending PT evaluation.   Procedures:  None.   Consultants:   Nephrology Vascular surgery.  Cardiology.  PCCM.   Antimicrobials:   Anti-infectives (From admission, onward)    Start     Dose/Rate Route Frequency Ordered Stop   10/23/22 1200  vancomycin (VANCOREADY) IVPB 750 mg/150 mL  Status:  Discontinued        750 mg 150 mL/hr over 60 Minutes Intravenous Every M-W-F (Hemodialysis) 10/21/22 8295 10/22/22 0739   10/21/22 0915  piperacillin-tazobactam (ZOSYN) IVPB 2.25  g        2.25 g 100 mL/hr over 30 Minutes Intravenous Every 8 hours 10/21/22 0822     10/21/22 0915  vancomycin (VANCOREADY) IVPB 1250 mg/250 mL        1,250 mg 166.7 mL/hr over 90 Minutes Intravenous  Once 10/21/22 0822 10/21/22 1038        Medications  Scheduled Meds:  darbepoetin (ARANESP) injection - DIALYSIS  40 mcg Subcutaneous Q Sun-1800   pantoprazole  40 mg Oral Daily   sevelamer carbonate  800 mg Oral TID WC   Continuous Infusions:  sodium chloride     norepinephrine (LEVOPHED) Adult infusion Stopped (10/22/22 0458)   piperacillin-tazobactam (ZOSYN)  IV 2.25 g (10/23/22 0547)   PRN Meds:.sodium chloride, acetaminophen, docusate, guaiFENesin, ondansetron (ZOFRAN) IV, mouth rinse, polyethylene glycol    Subjective:   [name] was seen and examined today.  Some back pain.   Objective:   Vitals:   10/23/22 0011 10/23/22 0020 10/23/22 0050 10/23/22 0421  BP:   (!) 146/72 (!) 143/64 (!) 129/59  Pulse:  88 85 73  Resp:    16  Temp:    98.3 F (36.8 C)  TempSrc:    Oral  SpO2: 100% 100% 99% 100%  Weight:      Height:        Intake/Output Summary (Last 24 hours) at 10/23/2022 0850 Last data filed at 10/23/2022 0547 Gross per 24 hour  Intake 541.28 ml  Output --  Net 541.28 ml   Filed Weights   10/21/22 1436 10/21/22 1837 10/22/22 0500  Weight: 65.4 kg 63.5 kg 62.6 kg     Exam General: Alert and oriented x 3, NAD Cardiovascular: S1 S2 auscultated, no murmurs, RRR Respiratory: Clear to auscultation bilaterally, no wheezing, rales or rhonchi Gastrointestinal: Soft, nontender, nondistended, + bowel sounds Ext: no pedal edema bilaterally Neuro: AAOx3, Cr N's II- XII. Strength 5/5 upper and lower extremities bilaterally Skin: No rashes Psych: Normal affect and demeanor, alert and oriented x3    Data Reviewed:  I have personally reviewed following labs and imaging studies   CBC Lab Results  Component Value Date   WBC 6.5 10/23/2022   RBC 2.14 (L) 10/23/2022   HGB 7.5 (L) 10/23/2022   HCT 22.5 (L) 10/23/2022   MCV 105.1 (H) 10/23/2022   MCH 35.0 (H) 10/23/2022   PLT 69 (L) 10/23/2022   MCHC 33.3 10/23/2022   RDW 14.8 10/23/2022   LYMPHSABS 0.9 10/22/2022   MONOABS 0.7 10/22/2022   EOSABS 0.2 10/22/2022   BASOSABS 0.1 10/22/2022     Last metabolic panel Lab Results  Component Value Date   NA 132 (L) 10/22/2022   K 3.7 10/22/2022   CL 93 (L) 10/22/2022   CO2 29 10/22/2022   BUN 19 10/22/2022   CREATININE 3.61 (H) 10/22/2022   GLUCOSE 120 (H) 10/22/2022   GFRNONAA 13 (L) 10/22/2022   CALCIUM 7.6 (L) 10/22/2022   PHOS 7.0 (H) 10/21/2022   PROT 8.8 (H) 10/20/2022   ALBUMIN 3.6 10/20/2022   BILITOT 1.2 10/20/2022   ALKPHOS 118 10/20/2022   AST 43 (H) 10/20/2022   ALT 22 10/20/2022   ANIONGAP 10 10/22/2022    CBG (last 3)  Recent Labs    10/21/22 2357 10/22/22 0348 10/22/22 0757  GLUCAP 123* 115* 137*       Coagulation Profile: No results for input(s): "INR", "PROTIME" in the last 168 hours.   Radiology Studies: No results found.  Kathlen Mody M.D. [facility] 10/23/2022, 8:50 AM  Available via Epic secure chat 7am-7pm After 7 pm, please refer to night coverage provider listed on amion.

## 2022-10-23 NOTE — Plan of Care (Signed)
  Problem: Activity: Goal: Ability to tolerate increased activity will improve Outcome: Progressing   Problem: Respiratory: Goal: Ability to maintain a clear airway and adequate ventilation will improve Outcome: Progressing   Problem: Role Relationship: Goal: Method of communication will improve Outcome: Progressing   Problem: Education: Goal: Ability to describe self-care measures that may prevent or decrease complications (Diabetes Survival Skills Education) will improve Outcome: Progressing Goal: Individualized Educational Video(s) Outcome: Progressing   Problem: Coping: Goal: Ability to adjust to condition or change in health will improve Outcome: Progressing   Problem: Fluid Volume: Goal: Ability to maintain a balanced intake and output will improve Outcome: Progressing   Problem: Health Behavior/Discharge Planning: Goal: Ability to identify and utilize available resources and services will improve Outcome: Progressing Goal: Ability to manage health-related needs will improve Outcome: Progressing   Problem: Metabolic: Goal: Ability to maintain appropriate glucose levels will improve Outcome: Progressing   Problem: Nutritional: Goal: Maintenance of adequate nutrition will improve Outcome: Progressing Goal: Progress toward achieving an optimal weight will improve Outcome: Progressing   Problem: Skin Integrity: Goal: Risk for impaired skin integrity will decrease Outcome: Progressing   Problem: Tissue Perfusion: Goal: Adequacy of tissue perfusion will improve Outcome: Progressing   Problem: Education: Goal: Knowledge of General Education information will improve Description: Including pain rating scale, medication(s)/side effects and non-pharmacologic comfort measures Outcome: Progressing   Problem: Health Behavior/Discharge Planning: Goal: Ability to manage health-related needs will improve Outcome: Progressing   Problem: Clinical Measurements: Goal:  Ability to maintain clinical measurements within normal limits will improve Outcome: Progressing Goal: Will remain free from infection Outcome: Progressing Goal: Diagnostic test results will improve Outcome: Progressing Goal: Respiratory complications will improve Outcome: Progressing Goal: Cardiovascular complication will be avoided Outcome: Progressing   Problem: Activity: Goal: Risk for activity intolerance will decrease Outcome: Progressing   Problem: Nutrition: Goal: Adequate nutrition will be maintained Outcome: Progressing   Problem: Coping: Goal: Level of anxiety will decrease Outcome: Progressing   Problem: Elimination: Goal: Will not experience complications related to bowel motility Outcome: Progressing Goal: Will not experience complications related to urinary retention Outcome: Progressing   Problem: Pain Managment: Goal: General experience of comfort will improve Outcome: Progressing   Problem: Safety: Goal: Ability to remain free from injury will improve Outcome: Progressing   Problem: Skin Integrity: Goal: Risk for impaired skin integrity will decrease Outcome: Progressing   

## 2022-10-23 NOTE — Progress Notes (Addendum)
[facility]  Subjective  - tender left groin, worried about cough.  Positive COVID 1 month ago.   Objective (!) 129/59 73 98.3 F (36.8 C) (Oral) 16 100%  Intake/Output Summary (Last 24 hours) at 10/23/2022 0740 Last data filed at 10/23/2022 0547 Gross per 24 hour  Intake 592.19 ml  Output --  Net 592.19 ml    Left groin soft around the hematoma, does not seem to be expanding.  Hmg 7.5 x 2 draws  post transfusion. Palpable DP pulses B LE Productive cough, O2 SAT 100% on 2L O2 Speech clear  Assessment/Planning: Left groin hematoma  Left groin soft around the hematoma, does not seem to be expanding.  Hmg 7.5 x 2 draws  post transfusion.  We will follow.  She is NPO pending Dr. Ophelia Charter exam.   Robitussin for cough post COVID    Stacey Chambers 10/23/2022 7:40 AM --  Laboratory Lab Results: Recent Labs    10/22/22 2146 10/23/22 0225  WBC 6.6 6.5  HGB 7.5* 7.5*  HCT 22.4* 22.5*  PLT 66* 69*   BMET Recent Labs    10/21/22 1118 10/21/22 2141 10/22/22 0349  NA 130*  --  132*  K 5.9* 3.5 3.7  CL 89*  --  93*  CO2 27  --  29  GLUCOSE 106*  --  120*  BUN 23  --  19  CREATININE 4.18*  --  3.61*  CALCIUM 7.7*  --  7.6*    COAG Lab Results  Component Value Date   INR 1.4 (H) 09/15/2022   INR 1.3 (H) 12/10/2021   INR 1.4 (H) 12/08/2021   No results found for: "PTT"  I have seen and evaluated the patient. I agree with the PA note as documented above.  Vascular surgery was called yesterday for left groin bleed after arterial line was removed prior to transfer out of the ICU.  We held pressure on the floor for over 1 hour.  She did get a unit of platelets.  I placed a pressure dressing overnight.  I removed her pressure dressing and her groin today and this is nice and soft.  She has expected ecchymosis and some hematoma in the thigh.  Her femoral pulse easily palpable palpable with no pulsatile mass to suggest pseudoaneurysm.   I think she can get out of bed today and mobilize.  Advance diet is fine.  Cephus Shelling, MD [facility] [place] Office: [phone]

## 2022-10-23 NOTE — Progress Notes (Signed)
Received patient in bed to unit.  Alert and oriented.  Informed consent signed and in chart.   TX duration: 3hrs  Patient tolerated well.  Alert, without acute distress.  Hand-off given to patient's nurse.   Access used: L AVF Access issues: None  Total UF removed: Medication(s) given: None   10/23/22 1741  Vitals  Temp 97.7 F (36.5 C)  Temp Source Oral  BP (!) 99/37  MAP (mmHg) (!) 57  BP Location Right Arm  BP Method Automatic  Patient Position (if appropriate) Lying  Pulse Rate 89  Pulse Rate Source Monitor  ECG Heart Rate 92  Resp 14  Oxygen Therapy  SpO2 97 %  O2 Device Room Air  Pulse Oximetry Type Continuous  During Treatment Monitoring  Intra-Hemodialysis Comments Tx completed;Tolerated well  Post Treatment  Dialyzer Clearance Lightly streaked  Duration of HD Treatment -hour(s) 3 hour(s)  Liters Processed 78  Fluid Removed (mL) 2000 mL  Tolerated HD Treatment Yes  AVG/AVF Arterial Site Held (minutes) 8 minutes  AVG/AVF Venous Site Held (minutes) 8 minutes  Fistula / Graft Left Upper arm Arteriovenous fistula  Placement Date/Time: 12/23/21 0816   Orientation: Left  Access Location: (c) Upper arm  Access Type: Arteriovenous fistula  Site Condition No complications  Fistula / Graft Assessment Present;Bruit;Thrill  Status Deaccessed     [facility]

## 2022-10-24 ENCOUNTER — Inpatient Hospital Stay: Payer: 59 | Attending: Internal Medicine | Admitting: Internal Medicine

## 2022-10-24 ENCOUNTER — Inpatient Hospital Stay: Payer: 59

## 2022-10-24 DIAGNOSIS — I442 Atrioventricular block, complete: Secondary | ICD-10-CM | POA: Diagnosis not present

## 2022-10-24 DIAGNOSIS — N185 Chronic kidney disease, stage 5: Secondary | ICD-10-CM | POA: Diagnosis not present

## 2022-10-24 DIAGNOSIS — R001 Bradycardia, unspecified: Secondary | ICD-10-CM | POA: Diagnosis not present

## 2022-10-24 DIAGNOSIS — E875 Hyperkalemia: Secondary | ICD-10-CM | POA: Diagnosis not present

## 2022-10-24 LAB — TRIGLYCERIDES: Triglycerides: 126 mg/dL (ref <150)

## 2022-10-24 LAB — CBC
HCT: 23.7 % — ABNORMAL LOW (ref 36.0–46.0)
Hemoglobin: 7.8 g/dL — ABNORMAL LOW (ref 12.0–15.0)
MCH: 35 pg — ABNORMAL HIGH (ref 26.0–34.0)
MCHC: 32.9 g/dL (ref 30.0–36.0)
MCV: 106.3 fL — ABNORMAL HIGH (ref 80.0–100.0)
Platelets: 86 10*3/uL — ABNORMAL LOW (ref 150–400)
RBC: 2.23 MIL/uL — ABNORMAL LOW (ref 3.87–5.11)
RDW: 14.6 % (ref 11.5–15.5)
WBC: 5.1 10*3/uL (ref 4.0–10.5)
nRBC: 0 % (ref 0.0–0.2)

## 2022-10-24 MED ORDER — IPRATROPIUM-ALBUTEROL 0.5-2.5 (3) MG/3ML IN SOLN
3.0000 mL | Freq: Once | RESPIRATORY_TRACT | Status: AC
  Administered 2022-10-24: 3 mL via RESPIRATORY_TRACT
  Filled 2022-10-24: qty 3

## 2022-10-24 MED ORDER — TRAZODONE HCL 50 MG PO TABS
50.0000 mg | ORAL_TABLET | Freq: Once | ORAL | Status: AC
  Administered 2022-10-24: 50 mg via ORAL
  Filled 2022-10-24: qty 1

## 2022-10-24 NOTE — Progress Notes (Signed)
Mobility Specialist Progress Note:   10/24/22 1400  Mobility  Activity Ambulated with assistance to bathroom  Level of Assistance Standby assist, set-up cues, supervision of patient - no hands on  Assistive Device None  Distance Ambulated (ft) 30 ft  Activity Response Tolerated well  Mobility Referral Yes  $Mobility charge 1 Mobility   Pt requesting to go to BR. Required no physical assistance, only supervision for safety. BM successful. Pt back sitting EOB with all needs met.   Addison Lank Mobility Specialist Please contact via SecureChat or  Rehab office at [phone]

## 2022-10-24 NOTE — Care Management Important Message (Signed)
Important Message  Patient Details  Name: Stacey Chambers MRN: [id number] Date of Birth: 06/17/1951   Medicare Important Message Given:  Yes     Renie Ora 10/24/2022, 9:20 AM

## 2022-10-24 NOTE — Progress Notes (Addendum)
[facility] [place]  Subjective  - no new complaints   Objective (!) 114/53 71 98.3 F (36.8 C) (Oral) 16 97%  Intake/Output Summary (Last 24 hours) at 10/24/2022 0655 Last data filed at 10/24/2022 0400 Gross per 24 hour  Intake 55.67 ml  Output 2000 ml  Net -1944.33 ml    Left groin soft, + ecchymosis.  No re current hematoma.  No ischemic skin changes. Palpable DP pedal pulses B  M/S intact B LE Lungs productive cough  Assessment/Planning: Vascular surgery was called yesterday for left groin bleed after arterial line was removed   Left groin soft, + ecchymosis.  No re current hematoma.  No ischemic skin changes.  HGB stable 7.8 Stable disposition f/u with [facility] as needed.  Call with questions or concerns.  Stacey Chambers 10/24/2022 6:55 AM --  Laboratory Lab Results: Recent Labs    10/23/22 0225 10/24/22 0247  WBC 6.5 5.1  HGB 7.5* 7.8*  HCT 22.5* 23.7*  PLT 69* 86*   BMET Recent Labs    10/22/22 0349 10/23/22 1429  NA 132* 137  K 3.7 4.0  CL 93* 97*  CO2 29 25  GLUCOSE 120* 89  BUN 19 43*  CREATININE 3.61* 6.65*  CALCIUM 7.6* 8.1*    COAG Lab Results  Component Value Date   INR 1.4 (H) 09/15/2022   INR 1.3 (H) 12/10/2021   INR 1.4 (H) 12/08/2021   No results found for: "PTT"  I have seen and evaluated the patient. I agree with the PA note as documented above. Left groin has remained stable.  Manual pressure held on Sunday evening >1 hour after aline removed and she developed hematoma.  Palpable femoral and DP pulse.  Call with any changes.  We will sign off.  Cephus Shelling, MD [facility] [place] Office: [phone]

## 2022-10-24 NOTE — Plan of Care (Signed)
  Problem: Activity: Goal: Ability to tolerate increased activity will improve Outcome: Progressing   Problem: Respiratory: Goal: Ability to maintain a clear airway and adequate ventilation will improve Outcome: Progressing   Problem: Role Relationship: Goal: Method of communication will improve Outcome: Progressing   Problem: Education: Goal: Ability to describe self-care measures that may prevent or decrease complications (Diabetes Survival Skills Education) will improve Outcome: Progressing Goal: Individualized Educational Video(s) Outcome: Progressing   Problem: Coping: Goal: Ability to adjust to condition or change in health will improve Outcome: Progressing   Problem: Fluid Volume: Goal: Ability to maintain a balanced intake and output will improve Outcome: Progressing   Problem: Health Behavior/Discharge Planning: Goal: Ability to identify and utilize available resources and services will improve Outcome: Progressing Goal: Ability to manage health-related needs will improve Outcome: Progressing   Problem: Metabolic: Goal: Ability to maintain appropriate glucose levels will improve Outcome: Progressing   Problem: Nutritional: Goal: Maintenance of adequate nutrition will improve Outcome: Progressing Goal: Progress toward achieving an optimal weight will improve Outcome: Progressing   Problem: Skin Integrity: Goal: Risk for impaired skin integrity will decrease Outcome: Progressing   Problem: Tissue Perfusion: Goal: Adequacy of tissue perfusion will improve Outcome: Progressing   Problem: Education: Goal: Knowledge of General Education information will improve Description: Including pain rating scale, medication(s)/side effects and non-pharmacologic comfort measures Outcome: Progressing   Problem: Health Behavior/Discharge Planning: Goal: Ability to manage health-related needs will improve Outcome: Progressing   Problem: Clinical Measurements: Goal:  Ability to maintain clinical measurements within normal limits will improve Outcome: Progressing Goal: Will remain free from infection Outcome: Progressing Goal: Diagnostic test results will improve Outcome: Progressing Goal: Respiratory complications will improve Outcome: Progressing Goal: Cardiovascular complication will be avoided Outcome: Progressing   Problem: Activity: Goal: Risk for activity intolerance will decrease Outcome: Progressing   Problem: Nutrition: Goal: Adequate nutrition will be maintained Outcome: Progressing   Problem: Coping: Goal: Level of anxiety will decrease Outcome: Progressing   Problem: Elimination: Goal: Will not experience complications related to bowel motility Outcome: Progressing Goal: Will not experience complications related to urinary retention Outcome: Progressing   Problem: Pain Managment: Goal: General experience of comfort will improve Outcome: Progressing   Problem: Safety: Goal: Ability to remain free from injury will improve Outcome: Progressing   Problem: Skin Integrity: Goal: Risk for impaired skin integrity will decrease Outcome: Progressing   

## 2022-10-24 NOTE — Progress Notes (Addendum)
Subjective: Seen and examined in room, tolerated 2 L UF yesterday on dialysis cough better.  On room air-  feels weak and still coughing   Objective Vital signs in last 24 hours: Vitals:   10/23/22 1747 10/23/22 1837 10/23/22 2046 10/24/22 0521  BP: (!) 103/44 (!) 119/34 (!) 111/54 (!) 114/53  Pulse: 86  84 71  Resp: Temp:   98.6 F (37 C) 98.3 F (36.8 C)  TempSrc:   Oral Oral  SpO2: 98% 99% 99% 97%  Weight:      Height:       Weight change:    Physical Exam: General: Alert adult female NAD Heart: RRR no MRG Lungs: Bilateral coarse breath sounds nonlabored breathing Abdomen: NABS soft NTND no ascites Extremities: No pedal edema, left groin ecchymosis, no bleed , sominimal swelling  dialysis Access: LUA AV aVF +bruit   Home meds include - norvasc 2.5, nexium, synthroid, losartan 25-50mg  qd, metoprolol  xl qd, remeron, prednisone taper, renvela 800 ac tid, prns    OP HD : AF MWF 3.5h  400/1.5  62.2kg   2/2 bath  dry wt pend   AVF  Hep none - last HD 4/17, post wt 62.4kg - mircera 50 mcg IV  q2 wks, last 4/8, due 4/22   Problem/Plan: Bradycardia/ complete heart block/ hyperkalemia -resolved after^ K treated, attempts at external pacing were not successful. Seen by card.and improved w/ dopamine gtt and tx of hyperkalemia. No plans for pacemaker.  Hypotension - was briefly requiring vasopressor support in ICU, transferred to floor this weekend now resolved, slightly orthostatic-  need to watch as OP-  no BP meds-  maybe some deconditioning Hyperkalemia -pre hd dialysis yest.K4.0 admit ^ K+ improved after temp measures and 1st HD, Pt .Reported eating a lot of potatoes as an outpatient ESRD - on HD MWF. No missed OP HD. Had HD here 4/19 and 4/20.now HD on schedule, HD next tomorrow HTN/volume -admit C XR= early edema HD x 2 =4 L UF total down to dry wt (? Bed wt accurate) UF 2 L yesterday tolerated currently at dry weight Anemia -Hgb 7.8 this a.m. <7.5 <t 8.4 , 40  mcg Aranesp 4/21 given, transfuse for Hgb under 7/VVS following groin hematoma noted platelets given by CCM yesterday= PLT 86<69 (chronically low as outpatient) Secondary hyperparathyroidism -phosphorus 6.8 corrected calcium at goal stressed Renvela compliance Back pain history of back surgery in the past=  no cos this am /plan per admit  Lenny Pastel, PA-C [facility] [phone] 10/24/2022,9:06 AM  LOS: 4 days   Patient seen and examined, agree with above note with above modifications. No issues with HD.  K is fine.  Now issue with anemia and groin hematoma-  seen by VVS - no intervention needed.  Still weak bur possibly close to discharge.  Plan for routine HD MWF and appropriate titration of HD related medications  Annie Sable, MD 10/24/2022    Labs: Basic Metabolic Panel: Recent Labs  Lab 10/20/22 1737 10/21/22 0415 10/21/22 1118 10/21/22 2141 10/22/22 0349 10/23/22 1429  NA 131* 128* 130*  --  132* 137  K 3.3* 5.4* 5.9* 3.5 3.7 4.0  CL 91* 92* 89*  --  93* 97*  CO2 --  29 25  GLUCOSE 129* 139* 106*  --  120* 89  BUN --  19 43*  CREATININE 1.99* 3.50* 4.18*  --  3.61* 6.65*  CALCIUM 8.2* 7.3* 7.7*  --  7.6* 8.1*  PHOS 4.3 7.0*  --   --   --  6.8*   Liver Function Tests: Recent Labs  Lab 10/20/22 1737 10/23/22 1429  AST 43*  --   ALT 22  --   ALKPHOS 118  --   BILITOT 1.2  --   PROT 8.8*  --   ALBUMIN 3.6 2.6*   No results for input(s): "LIPASE", "AMYLASE" in the last 168 hours. No results for input(s): "AMMONIA" in the last 168 hours. CBC: Recent Labs  Lab 10/21/22 0415 10/22/22 0349 10/22/22 2146 10/23/22 0225 10/24/22 0247  WBC 11.3* 7.7 6.6 6.5 5.1  NEUTROABS  --   --  4.8  --   --   HGB 9.9* 8.4* 7.5* 7.5* 7.8*  HCT 30.1* 26.2* 22.4* 22.5* 23.7*  MCV 107.1* 108.3* 105.2* 105.1* 106.3*  PLT 95* 58* 66* 69* 86*   Cardiac Enzymes: No results for input(s): "CKTOTAL", "CKMB", "CKMBINDEX", "TROPONINI"  in the last 168 hours. CBG: Recent Labs  Lab 10/21/22 1654 10/21/22 1954 10/21/22 2357 10/22/22 0348 10/22/22 0757  GLUCAP 114* 220* 123* 115* 137*    Studies/Results: DG Lumbar Spine 2-3 Views  Result Date: 10/23/2022 CLINICAL DATA:  Back pain EXAM: LUMBAR SPINE - 2-3 VIEW COMPARISON:  07/10/2018 FINDINGS: Posterior fusion changes from T12-L5, crossing a severe L3 compression fracture. No change in alignment. No hardware complicating feature. No acute fracture or subluxation. SI joints symmetric and unremarkable. IMPRESSION: Postoperative changes as above.  No acute bony abnormality. Electronically Signed   By: Charlett Nose M.D.   On: 10/23/2022 20:11   DG Chest 2 View  Result Date: 10/23/2022 CLINICAL DATA:  Back pain EXAM: CHEST - 2 VIEW COMPARISON:  Chest x-ray dated October 20, 2022 FINDINGS: Interval extubation and removal of enteric tube. Small bilateral pleural effusions. Mild right lower lobe and right middle lobe opacities. No evidence of pneumothorax. IMPRESSION: 1. Interval extubation and removal of enteric tube. 2. Small bilateral pleural effusions. 3. Mild right lower lobe and right middle lobe opacities, differential considerations include atelectasis, infection or aspiration. Electronically Signed   By: Allegra Lai M.D.   On: 10/23/2022 20:10   Medications:  sodium chloride     piperacillin-tazobactam (ZOSYN)  IV 100 mL/hr at 10/24/22 0400    darbepoetin (ARANESP) injection - DIALYSIS  40 mcg Subcutaneous Q Sun-1800   dextromethorphan-guaiFENesin  1 tablet Oral BID   pantoprazole  40 mg Oral Daily   sevelamer carbonate  800 mg Oral TID WC

## 2022-10-24 NOTE — Progress Notes (Signed)
Physical Therapy Treatment Patient Details Name: Stacey Chambers MRN: [id number] DOB: 1950/08/11 Today's Date: 10/24/2022   History of Present Illness Stacey Chambers is a 72 y.o. F who was admitted for seizure-like activity before HD on 10/20/22 in addition to bradycardia and 20 sec of asystole. Femoral line was placed in L groin and upon removal pt with extensive bleeding and large hematoma. HYQ:MVHQ 2 DM, ESRD, thrombocytopenia and iron deficiency anemia, PUD, HFpEF, back surgery, and cirrhosis    PT Comments    Pt received in supine, agreeable to therapy session with encouragement, with emphasis on activity pacing, gait safety, energy conservation and use of AD for safety given c/o lightheadedness, and benefits of mobility/OOB to chair for improved pulmonary clearance. BP stable 129/53 up in chair post-exertion. Pt would benefit from rollator upon DC given decreased activity tolerance and for fall risk prevention. Could trial TED hose/compression stockings also if MD amenable to see if this improved hemodynamics for better standing tolerance. Pt continues to benefit from PT services to progress toward functional mobility goals.    Recommendations for follow up therapy are one component of a multi-disciplinary discharge planning process, led by the attending physician.  Recommendations may be updated based on patient status, additional functional criteria and insurance authorization.  Follow Up Recommendations       Assistance Recommended at Discharge Intermittent Supervision/Assistance  Patient can return home with the following A little help with bathing/dressing/bathroom;A little help with walking and/or transfers;Assistance with cooking/housework;Direct supervision/assist for medications management;Direct supervision/assist for financial management;Assist for transportation;Help with stairs or ramp for entrance   Equipment Recommendations  BSC/3in1;Rollator (4 wheels) (a bench is  necessary for pt's activity pacing)    Recommendations for Other Services       Precautions / Restrictions Precautions Precautions: Fall Precaution Comments: L groin bruising Restrictions Weight Bearing Restrictions: No     Mobility  Bed Mobility Overal bed mobility: Modified Independent             General bed mobility comments: pt using rail, no assist needed    Transfers Overall transfer level: Needs assistance Equipment used: None Transfers: Sit to/from Stand Sit to Stand: Supervision           General transfer comment: from EOB and to recliner surfaces    Ambulation/Gait Ambulation/Gait assistance: Min guard Gait Distance (Feet): 50 Feet Assistive device: None, 1 person hand held assist Gait Pattern/deviations: Decreased stride length, Step-through pattern Gait velocity: dec     General Gait Details: pt with decreased step height and length and slightly wider base of support. Pt reports lightheadedness after ~35ft and requesting return to room. Pt offered RW for support but defers. BP/HR stable once pt seated in chair. (no pulse ox sensor in room but pt not dyspneic)   Stairs Stairs:  (pt too dizzy to walk to stairwell so defer for her safety)           Wheelchair Mobility    Modified Rankin (Stroke Patients Only)       Balance Overall balance assessment: Mild deficits observed, not formally tested (reaching for furniture at times)                                          Cognition Arousal/Alertness: Awake/alert Behavior During Therapy: WFL for tasks assessed/performed Overall Cognitive Status: No family/caregiver present to determine baseline cognitive functioning  General Comments: Pt is oriented x4. Pt able to follow all commands however becomes very anxious over cough and reports lightheadedness during gait trial, but refusing to trial RW and states she doesn't want one  for home. Pt states "I can't cough anything up". Encouraged her to sit up in chair, pt asking to speak with MD, RN notified.        Exercises Other Exercises Other Exercises: Encouraged ankle pumps x10 reps and use of IS hourly Other Exercises: Pt pulling [id number] on IS x10 reps, encouraged 10x/hr    General Comments General comments (skin integrity, edema, etc.): BP 129/53 HR 80's bpm post-exertion. Pt set up in chair and reviewed importance of OOB to chair for improved pulmonary clearance.      Pertinent Vitals/Pain Pain Assessment Pain Assessment: No/denies pain Pain Intervention(s): Monitored during session, Repositioned    Home Living                          Prior Function            PT Goals (current goals can now be found in the care plan section) Acute Rehab PT Goals Patient Stated Goal: home PT Goal Formulation: With patient Time For Goal Achievement: 11/06/22 Progress towards PT goals: Progressing toward goals    Frequency    Min 1X/week      PT Plan Current plan remains appropriate    Co-evaluation              AM-PAC PT "6 Clicks" Mobility   Outcome Measure  Help needed turning from your back to your side while in a flat bed without using bedrails?: None Help needed moving from lying on your back to sitting on the side of a flat bed without using bedrails?: None Help needed moving to and from a bed to a chair (including a wheelchair)?: A Little Help needed standing up from a chair using your arms (e.g., wheelchair or bedside chair)?: A Little Help needed to walk in hospital room?: A Little Help needed climbing 3-5 steps with a railing? : A Little 6 Click Score: 20    End of Session Equipment Utilized During Treatment: Gait belt Activity Tolerance: Other (comment) (lightheadedness) Patient left: with call bell/phone within reach;in chair (friend entering room) Nurse Communication: Mobility status;Other (comment) (dizzy with  ambulation, pt reluctant to trial RW) PT Visit Diagnosis: Unsteadiness on feet (R26.81);Muscle weakness (generalized) (M62.81);Difficulty in walking, not elsewhere classified (R26.2)     Time: 4098-1191 PT Time Calculation (min) (ACUTE ONLY): 17 min  Charges:  $Gait Training: 8-22 mins                     Cheikh Bramble P., PTA Acute Rehabilitation Services Secure Chat Preferred 9a-5:30pm Office: [phone]    [place] 10/24/2022, 6:38 PM

## 2022-10-24 NOTE — Progress Notes (Signed)
[facility]                                                                               Stacey Chambers, is a 72 y.o. female, DOB - 10-Dec-1950, ZOX:[id number] Admit date - 10/20/2022    Outpatient Primary MD for the patient is Stacey, [name], MD  LOS - 4  days    Brief summary   Stacey Chambers is a 72 y.o. F with PMH significant for Type 2 DM, ESRD, thrombocytopenia and iron deficiency anemia, PUD, HFpEF and cirrhosis who was at dialysis on the day of admission and had seizure-like activity before HD was initiated. After sz activity she went unresponsive but had a pulse and was breathing, en route with EMS had about 20 seconds of asystole, does not sounds like chest compressions were started, HR returned and she was bradycardic in the 30's after atropine. In the ED she was initially mentating, through remained bradycardic and became increasingly short of breath, so was intubated and transcutaneously paced. Cardiology saw patient, as external pacing was not capturing, this was discontinued. Her HR and blood pressure responded well to dopamine and she was seen by nephrology.  Sh was admitted by [facility] and transferred to [facility] on 4/22. Seen this morning, reports feeling weak and tired , coughing and still sob on ambulation. But she remains on RA.  Therapy evaluations ordered and pending.      Assessment & Plan    Assessment and Plan:  Bradycardia, CHB ? From hyperkalemia:  Resolved.     Hypotension:  Briefly required vasopressors,  BP parameters are optimal this morning.  At home she is on losartan and metoprolol succinate which are on hold for now.    Left groin hematoma at the site of the arterial line which was removed.  Vascular surgery have signed off.  Appreciate recommendations.  Ambulate as tolerated.    New on set seizures? From hyperkalemia/ bradycardia.  EEG and CT neg.    Acute hypoxemic respiratory failure secondary to aspiration pneumonia.  On  zosyn, possible transition to Augmentin in the next 24 hours to complete the course.  Currently on RA.  Patient currently on mucinex and tessalon.  Some wheezing earlier this am, duonebs ordered.  Repeat CXR last night showed  persistent Mild right lower lobe and right middle lobe opacities,. Will order IS.      ESRD ON HD  Nephrology on board. ON MWF scheduled.  On renvela 800 mg tid.    H/o PUD;  Resume PPI.    Chronic diastolic heart failure:  Fluid management with HD.    Iron deficiency anemia;  On aranesp q weekly. Hemoglobin around 7 and stable.   Chronic Thrombocytopenia:   Platelets Stabilized.  S/p 1 unit of platelets transfused.     Estimated body mass index is 26.82 kg/m as calculated from the following:   Height as of this encounter: 5' (1.524 m).   Weight as of this encounter: 62.3 kg.  Code Status: full code.  DVT Prophylaxis:  SCDs Start: 10/20/22 0847   Level of Care: Level of care: Telemetry Medical Family Communication: none at bedside.   Disposition Plan:  Remains inpatient appropriate:  pending PT evaluation. Reports feeling sob and weak.  Possible d.c in am.   Procedures:  None.   Consultants:   Nephrology Vascular surgery.  Cardiology.  PCCM.   Antimicrobials:   Anti-infectives (From admission, onward)    Start     Dose/Rate Route Frequency Ordered Stop   10/23/22 1745  piperacillin-tazobactam (ZOSYN) IVPB 2.25 g        2.25 g 100 mL/hr over 30 Minutes Intravenous Every 8 hours 10/23/22 1736     10/23/22 1200  vancomycin (VANCOREADY) IVPB 750 mg/150 mL  Status:  Discontinued        750 mg 150 mL/hr over 60 Minutes Intravenous Every M-W-F (Hemodialysis) 10/21/22 0822 10/22/22 0739   10/21/22 0915  piperacillin-tazobactam (ZOSYN) IVPB 2.25 g  Status:  Discontinued        2.25 g 100 mL/hr over 30 Minutes Intravenous Every 8 hours 10/21/22 0822 10/23/22 1736   10/21/22 0915  vancomycin (VANCOREADY) IVPB 1250 mg/250 mL         1,250 mg 166.7 mL/hr over 90 Minutes Intravenous  Once 10/21/22 0822 10/21/22 1038        Medications  Scheduled Meds:  darbepoetin (ARANESP) injection - DIALYSIS  40 mcg Subcutaneous Q Sun-1800   dextromethorphan-guaiFENesin  1 tablet Oral BID   ipratropium-albuterol  3 mL Nebulization Once   pantoprazole  40 mg Oral Daily   sevelamer carbonate  800 mg Oral TID WC   Continuous Infusions:  sodium chloride     piperacillin-tazobactam (ZOSYN)  IV 100 mL/hr at 10/24/22 0400   PRN Meds:.sodium chloride, acetaminophen, benzonatate, docusate, ondansetron (ZOFRAN) IV, mouth rinse, polyethylene glycol    Subjective:   Stacey Chambers was seen and examined today.  Feels weak, and sob, .  Objective:   Vitals:   10/23/22 1747 10/23/22 1837 10/23/22 2046 10/24/22 0521  BP: (!) 103/44 (!) 119/34 (!) 111/54 (!) 114/53  Pulse: 86  84 71  Resp: Temp:   98.6 F (37 C) 98.3 F (36.8 C)  TempSrc:   Oral Oral  SpO2: 98% 99% 99% 97%  Weight:      Height:        Intake/Output Summary (Last 24 hours) at 10/24/2022 1156 Last data filed at 10/24/2022 0400 Gross per 24 hour  Intake 55.67 ml  Output 2000 ml  Net -1944.33 ml    Filed Weights   10/22/22 0500 10/23/22 1411 10/23/22 1745  Weight: 62.6 kg 64.3 kg 62.3 kg     Exam General exam: Appears calm and comfortable  Respiratory system: Clear to auscultation. Respiratory effort normal. Cardiovascular system: S1 & S2 heard, RRR. No JVD, Gastrointestinal system: Abdomen is nondistended, soft and nontender. Central nervous system: Alert and oriented. No focal neurological deficits. Extremities: Symmetric 5 x 5 power. Skin: No rashes, lesions or ulcers Psychiatry: mood is appropriate.    Data Reviewed:  I have personally reviewed following labs and imaging studies   CBC Lab Results  Component Value Date   WBC 5.1 10/24/2022   RBC 2.23 (L) 10/24/2022   HGB 7.8 (L) 10/24/2022   HCT 23.7 (L) 10/24/2022   MCV 106.3  (H) 10/24/2022   MCH 35.0 (H) 10/24/2022   PLT 86 (L) 10/24/2022   MCHC 32.9 10/24/2022   RDW 14.6 10/24/2022   LYMPHSABS 0.9 10/22/2022   MONOABS 0.7 10/22/2022   EOSABS 0.2 10/22/2022   BASOSABS 0.1 10/22/2022     Last metabolic panel Lab Results  Component Value Date   NA 137 10/23/2022   K 4.0 10/23/2022   CL 97 (L) 10/23/2022   CO2 25 10/23/2022   BUN 43 (H) 10/23/2022   CREATININE 6.65 (H) 10/23/2022   GLUCOSE 89 10/23/2022   GFRNONAA 6 (L) 10/23/2022   CALCIUM 8.1 (L) 10/23/2022   PHOS 6.8 (H) 10/23/2022   PROT 8.8 (H) 10/20/2022   ALBUMIN 2.6 (L) 10/23/2022   BILITOT 1.2 10/20/2022   ALKPHOS 118 10/20/2022   AST 43 (H) 10/20/2022   ALT 22 10/20/2022   ANIONGAP 15 10/23/2022    CBG (last 3)  Recent Labs    10/21/22 2357 10/22/22 0348 10/22/22 0757  GLUCAP 123* 115* 137*       Coagulation Profile: No results for input(s): "INR", "PROTIME" in the last 168 hours.   Radiology Studies: DG Lumbar Spine 2-3 Views  Result Date: 10/23/2022 CLINICAL DATA:  Back pain EXAM: LUMBAR SPINE - 2-3 VIEW COMPARISON:  07/10/2018 FINDINGS: Posterior fusion changes from T12-L5, crossing a severe L3 compression fracture. No change in alignment. No hardware complicating feature. No acute fracture or subluxation. SI joints symmetric and unremarkable. IMPRESSION: Postoperative changes as above.  No acute bony abnormality. Electronically Signed   By: Charlett Nose M.D.   On: 10/23/2022 20:11   DG Chest 2 View  Result Date: 10/23/2022 CLINICAL DATA:  Back pain EXAM: CHEST - 2 VIEW COMPARISON:  Chest x-ray dated October 20, 2022 FINDINGS: Interval extubation and removal of enteric tube. Small bilateral pleural effusions. Mild right lower lobe and right middle lobe opacities. No evidence of pneumothorax. IMPRESSION: 1. Interval extubation and removal of enteric tube. 2. Small bilateral pleural effusions. 3. Mild right lower lobe and right middle lobe opacities, differential  considerations include atelectasis, infection or aspiration. Electronically Signed   By: Allegra Lai M.D.   On: 10/23/2022 20:10       Kathlen Mody M.D. [facility] 10/24/2022, 11:56 AM  Available via Epic secure chat 7am-7pm After 7 pm, please refer to night coverage provider listed on amion.

## 2022-10-25 DIAGNOSIS — R001 Bradycardia, unspecified: Secondary | ICD-10-CM | POA: Diagnosis not present

## 2022-10-25 LAB — CBC
HCT: 22.6 % — ABNORMAL LOW (ref 36.0–46.0)
Hemoglobin: 7.5 g/dL — ABNORMAL LOW (ref 12.0–15.0)
MCH: 35.2 pg — ABNORMAL HIGH (ref 26.0–34.0)
MCHC: 33.2 g/dL (ref 30.0–36.0)
MCV: 106.1 fL — ABNORMAL HIGH (ref 80.0–100.0)
Platelets: 73 10*3/uL — ABNORMAL LOW (ref 150–400)
RBC: 2.13 MIL/uL — ABNORMAL LOW (ref 3.87–5.11)
RDW: 14.7 % (ref 11.5–15.5)
WBC: 4.7 10*3/uL (ref 4.0–10.5)
nRBC: 0 % (ref 0.0–0.2)

## 2022-10-25 MED ORDER — MELATONIN 3 MG PO TABS
3.0000 mg | ORAL_TABLET | Freq: Every day | ORAL | Status: DC
  Administered 2022-10-25 – 2022-10-26 (×2): 3 mg via ORAL
  Filled 2022-10-25 (×2): qty 1

## 2022-10-25 MED ORDER — GUAIFENESIN 100 MG/5ML PO LIQD
5.0000 mL | ORAL | Status: DC | PRN
  Administered 2022-10-25 – 2022-10-26 (×2): 5 mL via ORAL
  Filled 2022-10-25 (×2): qty 10

## 2022-10-25 MED ORDER — AMOXICILLIN-POT CLAVULANATE 500-125 MG PO TABS
1.0000 | ORAL_TABLET | ORAL | Status: DC
  Administered 2022-10-25 – 2022-10-26 (×2): 1 via ORAL
  Filled 2022-10-25 (×3): qty 1

## 2022-10-25 MED ORDER — DARBEPOETIN ALFA 200 MCG/0.4ML IJ SOSY
200.0000 ug | PREFILLED_SYRINGE | INTRAMUSCULAR | Status: DC
  Filled 2022-10-25: qty 0.4

## 2022-10-25 NOTE — Progress Notes (Signed)
   10/25/22 1235  Vitals  Temp 97.7 F (36.5 C)  Pulse Rate 91  Resp (!) 22  BP (!) 97/44  SpO2 100 %  Post Treatment  Dialyzer Clearance Clear  Duration of HD Treatment -hour(s) 3.25 hour(s)  Hemodialysis Intake (mL) 0 mL  Liters Processed 67  Fluid Removed (mL) 2400 mL  Tolerated HD Treatment Yes  AVG/AVF Arterial Site Held (minutes) 6 minutes  AVG/AVF Venous Site Held (minutes) 6 minutes   Received patient in bed to unit.  Alert and oriented.  Informed consent signed and in chart.   TX duration:3.25hrs  Patient tolerated well.  Transported back to the room  Alert, without acute distress.  Hand-off given to patient's nurse.   Access used: LAVF Access issues: none  Total UF removed: 2.4L Medication(s) given: none    [facility]

## 2022-10-25 NOTE — Progress Notes (Signed)
Subjective: Seen on HD-  looks well-  got up with mobility last night -  not hypoxic but still c/o coughing-  trying for 2 liters with HD today -  there was mention of going home but pt sounds hesitant  Objective Vital signs in last 24 hours: Vitals:   10/25/22 0900 10/25/22 0908 10/25/22 0930 10/25/22 1000  BP: (!) 141/63 (!) 132/54 (!) 124/58 (!) 119/50  Pulse: 82 87 85 91  Resp: Temp:      TempSrc:      SpO2:  99% 98%   Weight:      Height:       Weight change:    Physical Exam: General: Alert adult female NAD Heart: RRR no MRG Lungs: Bilateral coarse breath sounds - nonlabored breathing Abdomen: NABS soft NTND no ascites Extremities: No pedal edema, left groin ecchymosis, no bleed , minimal swelling  dialysis Access: LUA AV aVF +bruit   Home meds include - norvasc 2.5, nexium, synthroid, losartan 25-50mg  qd, metoprolol  xl qd, remeron, prednisone taper, renvela 800 ac tid, prns    OP HD : AF MWF 3.5h  400/1.5  62.2kg   2/2 bath  dry wt pend   AVF  Hep none - last HD 4/17, post wt 62.4kg - mircera 50 mcg IV  q2 wks, last 4/8, due 4/22   Problem/Plan: Bradycardia/ complete heart block/ hyperkalemia -resolved after^ K treated, attempts at external pacing were not successful. Seen by card.and improved w/ dopamine gtt and tx of hyperkalemia. No plans for pacemaker.  Hypotension - was briefly requiring vasopressor support in ICU, transferred to floor this weekend now resolved, slightly orthostatic-  need to watch as OP-  no BP meds-  maybe some deconditioning Hyperkalemia -pre hd dialysis yest.K4.0 admit ^ K+ improved after temp measures and 1st HD, Pt .Reported eating a lot of potatoes as an outpatient ESRD - on HD MWF. No missed OP HD. Had HD here 4/19 and 4/20.now HD on schedule, running today HTN/volume -admit C XR= early edema HD x 2 =4 L UF total down to dry wt (? Bed wt accurate) UF 2 L yesterday tolerated currently at dry weight Anemia -Hgb has been low  but stable Secondary hyperparathyroidism -phosphorus 6.8 corrected calcium at goal stressed Renvela compliance Back pain history of back surgery in the past=  no cos this am /plan per admit Dispo-  I agree could be discharged-  she will need to be pushed a little-  I told her that I thought HD would help  Annie Sable, MD 10/25/2022    Labs: Basic Metabolic Panel: Recent Labs  Lab 10/20/22 1737 10/21/22 0415 10/21/22 1118 10/21/22 2141 10/22/22 0349 10/23/22 1429  NA 131* 128* 130*  --  132* 137  K 3.3* 5.4* 5.9* 3.5 3.7 4.0  CL 91* 92* 89*  --  93* 97*  CO2 --  29 25  GLUCOSE 129* 139* 106*  --  120* 89  BUN --  19 43*  CREATININE 1.99* 3.50* 4.18*  --  3.61* 6.65*  CALCIUM 8.2* 7.3* 7.7*  --  7.6* 8.1*  PHOS 4.3 7.0*  --   --   --  6.8*   Liver Function Tests: Recent Labs  Lab 10/20/22 1737 10/23/22 1429  AST 43*  --   ALT 22  --   ALKPHOS 118  --   BILITOT 1.2  --   PROT 8.8*  --  ALBUMIN 3.6 2.6*   No results for input(s): "LIPASE", "AMYLASE" in the last 168 hours. No results for input(s): "AMMONIA" in the last 168 hours. CBC: Recent Labs  Lab 10/22/22 0349 10/22/22 2146 10/23/22 0225 10/24/22 0247 10/25/22 0156  WBC 7.7 6.6 6.5 5.1 4.7  NEUTROABS  --  4.8  --   --   --   HGB 8.4* 7.5* 7.5* 7.8* 7.5*  HCT 26.2* 22.4* 22.5* 23.7* 22.6*  MCV 108.3* 105.2* 105.1* 106.3* 106.1*  PLT 58* 66* 69* 86* 73*   Cardiac Enzymes: No results for input(s): "CKTOTAL", "CKMB", "CKMBINDEX", "TROPONINI" in the last 168 hours. CBG: Recent Labs  Lab 10/21/22 1654 10/21/22 1954 10/21/22 2357 10/22/22 0348 10/22/22 0757  GLUCAP 114* 220* 123* 115* 137*    Studies/Results: DG Lumbar Spine 2-3 Views  Result Date: 10/23/2022 CLINICAL DATA:  Back pain EXAM: LUMBAR SPINE - 2-3 VIEW COMPARISON:  07/10/2018 FINDINGS: Posterior fusion changes from T12-L5, crossing a severe L3 compression fracture. No change in alignment. No hardware  complicating feature. No acute fracture or subluxation. SI joints symmetric and unremarkable. IMPRESSION: Postoperative changes as above.  No acute bony abnormality. Electronically Signed   By: Charlett Nose M.D.   On: 10/23/2022 20:11   DG Chest 2 View  Result Date: 10/23/2022 CLINICAL DATA:  Back pain EXAM: CHEST - 2 VIEW COMPARISON:  Chest x-ray dated October 20, 2022 FINDINGS: Interval extubation and removal of enteric tube. Small bilateral pleural effusions. Mild right lower lobe and right middle lobe opacities. No evidence of pneumothorax. IMPRESSION: 1. Interval extubation and removal of enteric tube. 2. Small bilateral pleural effusions. 3. Mild right lower lobe and right middle lobe opacities, differential considerations include atelectasis, infection or aspiration. Electronically Signed   By: Allegra Lai M.D.   On: 10/23/2022 20:10   Medications:  sodium chloride     piperacillin-tazobactam (ZOSYN)  IV 2.25 g (10/24/22 2059)    darbepoetin (ARANESP) injection - DIALYSIS  40 mcg Subcutaneous Q Sun-1800   dextromethorphan-guaiFENesin  1 tablet Oral BID   pantoprazole  40 mg Oral Daily   sevelamer carbonate  800 mg Oral TID WC

## 2022-10-25 NOTE — Procedures (Signed)
Patient was seen on dialysis and the procedure was supervised.  BFR 400  Via AVF BP is  119/50.   Patient appears to be tolerating treatment well  Cecille Aver 10/25/2022

## 2022-10-25 NOTE — Progress Notes (Signed)
Pt refused bed alarm. Falls education done, verbalizes understanding and says she will call for help when needed

## 2022-10-25 NOTE — Progress Notes (Signed)
PROGRESS NOTE    Stacey Chambers  ZOX:[id number] DOB: March 04, 1951 DOA: 10/20/2022.  PCP: Georgina Quint, MD   Brief Narrative:  This 72 yrs old Female with PMH significant for Type 2 DM, ESRD, thrombocytopenia and iron deficiency anemia, PUD, HFpEF and cirrhosis who was at dialysis on the day of admission and had seizure-like activity before HD was initiated. After sz activity she went unresponsive but had a pulse and was breathing, en route with EMS had about 20 seconds of asystole, does not sounds like chest compressions were started, HR returned and she was bradycardic in the 30's after atropine. In the ED she was initially mentating, throughout remained bradycardic and became increasingly short of breath, so was intubated and transcutaneously paced. Cardiology evaluated the patient, as external pacing was not capturing, this was discontinued. Her HR and blood pressure responded well to dopamine and She was seen by nephrology.  Sh was admitted by [facility] and transferred to [facility] on 10/23/22.  Assessment & Plan:   Principal Problem:   Bradycardia Active Problems:   Shock  Bradycardia > CHB > From Hyperkalemia. Resolved with hemodialysis.   Hypotension:  She briefly required vasopressors, Off pressors now. BP parameters are optimal this morning.  At home she is on losartan and metoprolol succinate which are on hold for now.    Left groin hematoma at the site of the arterial line which was removed.  Vascular surgery have signed off.  Appreciate recommendations.  Ambulate as tolerated.    New on set seizures? Likely from hyperkalemia / bradycardia.  EEG and CT neg.   Acute hypoxemic respiratory failure secondary to aspiration pneumonia:  On zosyn, possible transition to Augmentin in the next 24 hours to complete the course.  She is weaned down to room air. Patient currently on mucinex and tessalon.  Continue DuoNeb nebulization for wheezing. Repeat CXR last night showed   persistent Mild right lower lobe and right middle lobe opacities,. Continue incentive spirometry.   ESRD ON HD  Nephrology on board. ON MWF scheduled.  On renvela 800 mg tid.    H/o PUD;  Resume PPI.    Chronic diastolic heart failure:  Fluid management with HD.    Iron deficiency anemia;  On aranesp q weekly. Hemoglobin around 7 and stable.    Chronic Thrombocytopenia:  Platelets Stabilized.  S/p 1 unit of platelets transfused.      Estimated body mass index is 26.82 kg/m as calculated from the following:   Height as of this encounter: 5' (1.524 m).   Weight as of this encounter: 62.3 kg.    DVT prophylaxis:SCDs Code Status:Full code Family Communication: No family at bed side.  Disposition Plan:   Status is: Inpatient Remains inpatient appropriate because: Reports feeling very weak and tired.  Stable discharge 10/26/2022   Consultants:  Nephrology Vascular surgery Cardiology PCCM  Procedures: None Antimicrobials:  Anti-infectives (From admission, onward)    Start     Dose/Rate Route Frequency Ordered Stop   10/23/22 1745  piperacillin-tazobactam (ZOSYN) IVPB 2.25 g        2.25 g 100 mL/hr over 30 Minutes Intravenous Every 8 hours 10/23/22 1736     10/23/22 1200  vancomycin (VANCOREADY) IVPB 750 mg/150 mL  Status:  Discontinued        750 mg 150 mL/hr over 60 Minutes Intravenous Every M-W-F (Hemodialysis) 10/21/22 0822 10/22/22 0739   10/21/22 0915  piperacillin-tazobactam (ZOSYN) IVPB 2.25 g  Status:  Discontinued  2.25 g 100 mL/hr over 30 Minutes Intravenous Every 8 hours 10/21/22 0822 10/23/22 1736   10/21/22 0915  vancomycin (VANCOREADY) IVPB 1250 mg/250 mL        1,250 mg 166.7 mL/hr over 90 Minutes Intravenous  Once 10/21/22 1610 10/21/22 1038      Subjective: Patient was seen and examined at hemodialysis suite.  Overnight events noted. She reports feeling very weak and tired, does not feel ready for discharge.  Objective: Vitals:    10/25/22 1030 10/25/22 1100 10/25/22 1130 10/25/22 1200  BP: (!) 125/58 (!) 103/50 (!) 119/51 (!) 99/48  Pulse: 90 84 87 94  Resp: Temp:      TempSrc:      SpO2:   99% 100%  Weight:      Height:        Intake/Output Summary (Last 24 hours) at 10/25/2022 1226 Last data filed at 10/24/2022 2300 Gross per 24 hour  Intake 371.04 ml  Output --  Net 371.04 ml   Filed Weights   10/23/22 1411 10/23/22 1745 10/25/22 0833  Weight: 64.3 kg 62.3 kg 62.4 kg    Examination:  General exam: Appears comfortable, not in any acute distress.  Deconditioned. Respiratory system: Clear to auscultation. Respiratory effort normal.  RR 16 Cardiovascular system: S1 & S2 heard, regular rate and rhythm, no murmur. Gastrointestinal system: Abdomen is soft, non tender, non distended, BS+ Central nervous system: Alert and oriented x 3. No focal neurological deficits. Extremities: No edema, no cyanosis, no clubbing. Skin: No rashes, lesions or ulcers Psychiatry: Judgement and insight appear normal. Mood & affect appropriate.     Data Reviewed: I have personally reviewed following labs and imaging studies  CBC: Recent Labs  Lab 10/22/22 0349 10/22/22 2146 10/23/22 0225 10/24/22 0247 10/25/22 0156  WBC 7.7 6.6 6.5 5.1 4.7  NEUTROABS  --  4.8  --   --   --   HGB 8.4* 7.5* 7.5* 7.8* 7.5*  HCT 26.2* 22.4* 22.5* 23.7* 22.6*  MCV 108.3* 105.2* 105.1* 106.3* 106.1*  PLT 58* 66* 69* 86* 73*   Basic Metabolic Panel: Recent Labs  Lab 10/20/22 0752 10/20/22 0755 10/20/22 1737 10/21/22 0415 10/21/22 1118 10/21/22 2141 10/22/22 0349 10/23/22 1429  NA 130*   < > 131* 128* 130*  --  132* 137  K 6.2*   < > 3.3* 5.4* 5.9* 3.5 3.7 4.0  CL 94*   < > 91* 92* 89*  --  93* 97*  CO2 18*  --  --  29 25  GLUCOSE 176*   < > 129* 139* 106*  --  120* 89  BUN 30*   < > --  19 43*  CREATININE 4.87*   < > 1.99* 3.50* 4.18*  --  3.61* 6.65*  CALCIUM 8.2*  --  8.2* 7.3* 7.7*  --   7.6* 8.1*  MG 2.7*  --   --  1.9  --   --  2.0  --   PHOS  --   --  4.3 7.0*  --   --   --  6.8*   < > = values in this interval not displayed.   GFR: Estimated Creatinine Clearance: 6.6 mL/min (A) (by C-G formula based on SCr of 6.65 mg/dL (H)). Liver Function Tests: Recent Labs  Lab 10/20/22 1737 10/23/22 1429  AST 43*  --   ALT 22  --   ALKPHOS 118  --  BILITOT 1.2  --   PROT 8.8*  --   ALBUMIN 3.6 2.6*   No results for input(s): "LIPASE", "AMYLASE" in the last 168 hours. No results for input(s): "AMMONIA" in the last 168 hours. Coagulation Profile: No results for input(s): "INR", "PROTIME" in the last 168 hours. Cardiac Enzymes: No results for input(s): "CKTOTAL", "CKMB", "CKMBINDEX", "TROPONINI" in the last 168 hours. BNP (last 3 results) No results for input(s): "PROBNP" in the last 8760 hours. HbA1C: No results for input(s): "HGBA1C" in the last 72 hours. CBG: Recent Labs  Lab 10/21/22 1654 10/21/22 1954 10/21/22 2357 10/22/22 0348 10/22/22 0757  GLUCAP 114* 220* 123* 115* 137*   Lipid Profile: Recent Labs    10/24/22 0247  TRIG 126   Thyroid Function Tests: No results for input(s): "TSH", "T4TOTAL", "FREET4", "T3FREE", "THYROIDAB" in the last 72 hours. Anemia Panel: No results for input(s): "VITAMINB12", "FOLATE", "FERRITIN", "TIBC", "IRON", "RETICCTPCT" in the last 72 hours. Sepsis Labs: Recent Labs  Lab 10/21/22 0415  PROCALCITON 3.13    Recent Results (from the past 240 hour(s))  MRSA Next Gen by PCR, Nasal     Status: None   Collection Time: 10/20/22  7:27 PM   Specimen: Nasal Mucosa; Nasal Swab  Result Value Ref Range Status   MRSA by PCR Next Gen NOT DETECTED NOT DETECTED Final    Comment: (NOTE) The GeneXpert MRSA Assay (FDA approved for NASAL specimens only), is one component of a comprehensive MRSA colonization surveillance program. It is not intended to diagnose MRSA infection nor to guide or monitor treatment for MRSA  infections. Test performance is not FDA approved in patients less than 25 years old. Performed at [facility], [address] [place], [place] [address]     Radiology Studies: DG Lumbar Spine 2-3 Views  Result Date: 10/23/2022 CLINICAL DATA:  Back pain EXAM: LUMBAR SPINE - 2-3 VIEW COMPARISON:  07/10/2018 FINDINGS: Posterior fusion changes from T12-L5, crossing a severe L3 compression fracture. No change in alignment. No hardware complicating feature. No acute fracture or subluxation. SI joints symmetric and unremarkable. IMPRESSION: Postoperative changes as above.  No acute bony abnormality. Electronically Signed   By: Charlett Nose M.D.   On: 10/23/2022 20:11   DG Chest 2 View  Result Date: 10/23/2022 CLINICAL DATA:  Back pain EXAM: CHEST - 2 VIEW COMPARISON:  Chest x-ray dated October 20, 2022 FINDINGS: Interval extubation and removal of enteric tube. Small bilateral pleural effusions. Mild right lower lobe and right middle lobe opacities. No evidence of pneumothorax. IMPRESSION: 1. Interval extubation and removal of enteric tube. 2. Small bilateral pleural effusions. 3. Mild right lower lobe and right middle lobe opacities, differential considerations include atelectasis, infection or aspiration. Electronically Signed   By: Allegra Lai M.D.   On: 10/23/2022 20:10    Scheduled Meds:  [START ON 10/29/2022] darbepoetin (ARANESP) injection - DIALYSIS  200 mcg Subcutaneous Q Sun-1800   dextromethorphan-guaiFENesin  1 tablet Oral BID   pantoprazole  40 mg Oral Daily   sevelamer carbonate  800 mg Oral TID WC   Continuous Infusions:  sodium chloride     piperacillin-tazobactam (ZOSYN)  IV 2.25 g (10/24/22 2059)     LOS: 5 days    Time spent: 35 mins    Willeen Niece, MD [facility]   If 7PM-7AM, please contact night-coverage

## 2022-10-25 NOTE — Plan of Care (Signed)
  Problem: Activity: Goal: Ability to tolerate increased activity will improve Outcome: Progressing   Problem: Respiratory: Goal: Ability to maintain a clear airway and adequate ventilation will improve Outcome: Progressing   Problem: Role Relationship: Goal: Method of communication will improve Outcome: Progressing   Problem: Education: Goal: Ability to describe self-care measures that may prevent or decrease complications (Diabetes Survival Skills Education) will improve Outcome: Progressing Goal: Individualized Educational Video(s) Outcome: Progressing   Problem: Coping: Goal: Ability to adjust to condition or change in health will improve Outcome: Progressing   Problem: Fluid Volume: Goal: Ability to maintain a balanced intake and output will improve Outcome: Progressing   Problem: Health Behavior/Discharge Planning: Goal: Ability to identify and utilize available resources and services will improve Outcome: Progressing Goal: Ability to manage health-related needs will improve Outcome: Progressing   Problem: Metabolic: Goal: Ability to maintain appropriate glucose levels will improve Outcome: Progressing   Problem: Nutritional: Goal: Maintenance of adequate nutrition will improve Outcome: Progressing Goal: Progress toward achieving an optimal weight will improve Outcome: Progressing   Problem: Skin Integrity: Goal: Risk for impaired skin integrity will decrease Outcome: Progressing   Problem: Tissue Perfusion: Goal: Adequacy of tissue perfusion will improve Outcome: Progressing   Problem: Education: Goal: Knowledge of General Education information will improve Description: Including pain rating scale, medication(s)/side effects and non-pharmacologic comfort measures Outcome: Progressing   Problem: Health Behavior/Discharge Planning: Goal: Ability to manage health-related needs will improve Outcome: Progressing   Problem: Clinical Measurements: Goal:  Ability to maintain clinical measurements within normal limits will improve Outcome: Progressing Goal: Will remain free from infection Outcome: Progressing Goal: Diagnostic test results will improve Outcome: Progressing Goal: Respiratory complications will improve Outcome: Progressing Goal: Cardiovascular complication will be avoided Outcome: Progressing   Problem: Activity: Goal: Risk for activity intolerance will decrease Outcome: Progressing   Problem: Nutrition: Goal: Adequate nutrition will be maintained Outcome: Progressing   Problem: Coping: Goal: Level of anxiety will decrease Outcome: Progressing   Problem: Elimination: Goal: Will not experience complications related to bowel motility Outcome: Progressing Goal: Will not experience complications related to urinary retention Outcome: Progressing   Problem: Pain Managment: Goal: General experience of comfort will improve Outcome: Progressing   Problem: Safety: Goal: Ability to remain free from injury will improve Outcome: Progressing   Problem: Skin Integrity: Goal: Risk for impaired skin integrity will decrease Outcome: Progressing   

## 2022-10-26 ENCOUNTER — Encounter (HOSPITAL_COMMUNITY): Payer: Self-pay | Admitting: Pulmonary Disease

## 2022-10-26 DIAGNOSIS — R001 Bradycardia, unspecified: Secondary | ICD-10-CM | POA: Diagnosis not present

## 2022-10-26 LAB — BASIC METABOLIC PANEL
Anion gap: 14 (ref 5–15)
BUN: 19 mg/dL (ref 8–23)
CO2: 27 mmol/L (ref 22–32)
Calcium: 9.1 mg/dL (ref 8.9–10.3)
Chloride: 95 mmol/L — ABNORMAL LOW (ref 98–111)
Creatinine, Ser: 4.3 mg/dL — ABNORMAL HIGH (ref 0.44–1.00)
GFR, Estimated: 11 mL/min — ABNORMAL LOW (ref >60)
Glucose, Bld: 125 mg/dL — ABNORMAL HIGH (ref 70–99)
Potassium: 3.8 mmol/L (ref 3.5–5.1)
Sodium: 136 mmol/L (ref 135–145)

## 2022-10-26 LAB — MAGNESIUM: Magnesium: 2 mg/dL (ref 1.7–2.4)

## 2022-10-26 LAB — CBC
HCT: 26.3 % — ABNORMAL LOW (ref 36.0–46.0)
Hemoglobin: 8.6 g/dL — ABNORMAL LOW (ref 12.0–15.0)
MCH: 34.7 pg — ABNORMAL HIGH (ref 26.0–34.0)
MCHC: 32.7 g/dL (ref 30.0–36.0)
MCV: 106 fL — ABNORMAL HIGH (ref 80.0–100.0)
Platelets: 87 10*3/uL — ABNORMAL LOW (ref 150–400)
RBC: 2.48 MIL/uL — ABNORMAL LOW (ref 3.87–5.11)
RDW: 15.1 % (ref 11.5–15.5)
WBC: 5.4 10*3/uL (ref 4.0–10.5)
nRBC: 0 % (ref 0.0–0.2)

## 2022-10-26 LAB — PHOSPHORUS: Phosphorus: 4.9 mg/dL — ABNORMAL HIGH (ref 2.5–4.6)

## 2022-10-26 MED ORDER — DARBEPOETIN ALFA 200 MCG/0.4ML IJ SOSY: 200.0000 ug | PREFILLED_SYRINGE | INTRAMUSCULAR | Status: DC

## 2022-10-26 NOTE — Progress Notes (Addendum)
Patient was complaining about something in her throat and she coughed out a yellow bloody looking fibroid material. Placed in cup for doctor to see. Pt denies SOB, O2 saturation greater than 95%. We will continue to monitor.

## 2022-10-26 NOTE — TOC Initial Note (Signed)
Transition of Care [facility]) - Initial/Assessment Note    Patient Details  Name: Stacey Chambers MRN: [id number] Date of Birth: January 22, 1951  Transition of Care [facility]) CM/SW Contact:    Gala Lewandowsky, RN Phone Number: 10/26/2022, 1:18 PM  Clinical Narrative: Patient presented for bradycardia. Hx ESRD- MWF. PTA patient was from home with a friend. Case Manager ordered DME bedside commode and rollator via [employer]. DME to be delivered to the room. No home needs identified at this time. Patient reports that her friend will transport home.                Expected Discharge Plan: Home/Self Care Barriers to Discharge: Continued Medical Work up   Patient Goals and CMS Choice Patient states their goals for this hospitalization and ongoing recovery are:: plan is to return home.   Expected Discharge Plan and Services In-house Referral: NA Discharge Planning Services: CM Consult Post Acute Care Choice: NA Living arrangements for the past 2 months: Apartment                 DME Arranged: Walker rolling with seat, Bedside commode DME Agency: [employer] Date DME Agency Contacted: 10/26/22 Time DME Agency Contacted: 1317 Representative spoke with at DME Agency: Barbara Cower HH Arranged: NA   Prior Living Arrangements/Services Living arrangements for the past 2 months: Apartment Lives with:: Friends Patient language and need for interpreter reviewed:: Yes        Need for Family Participation in Patient Care: No (Comment) Care giver support system in place?: No (comment)   Criminal Activity/Legal Involvement Pertinent to Current Situation/Hospitalization: No - Comment as needed  Activities of Daily Living Home Assistive Devices/Equipment: None ADL Screening (condition at time of admission) Patient's cognitive ability adequate to safely complete daily activities?: Yes Is the patient deaf or have difficulty hearing?: No Does the patient have difficulty seeing, even when wearing  glasses/contacts?: No Does the patient have difficulty concentrating, remembering, or making decisions?: No Patient able to express need for assistance with ADLs?: Yes Does the patient have difficulty dressing or bathing?: No Independently performs ADLs?: Yes (appropriate for developmental age) Does the patient have difficulty walking or climbing stairs?: No Weakness of Legs: None Weakness of Arms/Hands: None  Permission Sought/Granted Permission sought to share information with : Case Manager, Family Supports       Permission granted to share info w AGENCY: [employer]    Emotional Assessment Appearance:: Appears stated age Attitude/Demeanor/Rapport: Engaged Affect (typically observed): Appropriate Orientation: : Oriented to Self, Oriented to Place, Oriented to  Time, Oriented to Situation Alcohol / Substance Use: Not Applicable Psych Involvement: No (comment)  Admission diagnosis:  Complete heart block [I44.2] Hyperkalemia [E87.5] Bradycardia [R00.1] Chronic renal failure, 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] Patient Active Problem List   Diagnosis Date Noted   Bradycardia 10/20/2022   Shock 10/20/2022   Fluid overload 09/16/2022   Lumbar post-laminectomy syndrome 07/12/2022   Other chronic pain 07/12/2022   Sacrococcygeal disorders, not elsewhere classified 07/12/2022   Vertebrogenic low back pain 07/12/2022   Hypertension associated with diabetes 03/30/2022   End stage renal disease on dialysis 03/30/2022   Liver disease, chronic, with cirrhosis 12/13/2021   Acute diastolic CHF (congestive heart failure) 12/10/2021   COVID-19 virus infection 12/10/2021   Hypertensive urgency 12/10/2021   Diabetes mellitus type 2, controlled, without complications 12/10/2021   Acute renal failure superimposed on stage 3b chronic kidney disease 12/10/2021   Nephrotic syndrome 12/10/2021   Normocytic anemia 12/10/2021   Low serum albumin 12/10/2021  Thrombocytopenia 12/10/2021   Cirrhosis 12/01/2021    Swelling of lower extremity 12/01/2021   Acute kidney injury superimposed on CKD 12/01/2021   Diabetes mellitus type 2 with complications 12/01/2021   Abnormal LFTs 04/29/2021   Iron deficiency 04/29/2021   Gastric ulcer 04/29/2021   Splenic lesion 04/29/2021   History of gastric ulcer 03/14/2021   History of thrombocytopenia 03/14/2021   History of iron deficiency anemia 03/14/2021   Macrocytosis 03/14/2021   PCP:  Georgina Quint, MD Pharmacy:   [facility] [address] [place] - [address] [place] [address] Phone: [phone] Fax: [phone]  [place] [facility] [address] [place] [address] Phone: [phone] Fax: [phone]  Social Determinants of Health (SDOH) Social History: SDOH Screenings   Depression (PHQ2-9): Low Risk  (07/12/2022)  Tobacco Use: Low Risk  (10/26/2022)   Readmission Risk Interventions     No data to display

## 2022-10-26 NOTE — Progress Notes (Signed)
PROGRESS NOTE    Stacey Chambers  JYN:[id number] DOB: [birth year] DOA: 10/20/2022.  PCP: Georgina Quint, MD   Brief Narrative:  This 72 yrs old Female with PMH significant for Type 2 DM, ESRD, thrombocytopenia and iron deficiency anemia, PUD, HFpEF and cirrhosis who was at dialysis on the day of admission and had seizure-like activity before HD was initiated. After sz activity she went unresponsive but had a pulse and was breathing, en route with EMS had about 20 seconds of asystole, does not sounds like chest compressions were started, HR returned and she was bradycardic in the 30's after atropine. In the ED she was initially mentating, throughout remained bradycardic and became increasingly short of breath, so was intubated and transcutaneously paced. Cardiology evaluated the patient, as external pacing was not capturing, this was discontinued. Her HR and blood pressure responded well to dopamine and She was seen by nephrology.  Sh was admitted by [facility] and transferred to [facility] on 10/23/22.  Assessment & Plan:   Principal Problem:   Bradycardia Active Problems:   Shock  Bradycardia > CHB > From Hyperkalemia. Resolved with hemodialysis.  No pacemaker required.   Hypotension:  She briefly required vasopressors, Off pressors now. BP parameters are optimal this morning.  At home she is on losartan and metoprolol succinate which are on hold for now.    Left groin hematoma at the site of the arterial line which was removed.  Vascular surgery have signed off.  Appreciate recommendations.  Ambulate as tolerated.    New on set seizures? Likely from hyperkalemia / bradycardia.  EEG and CT neg.   Acute hypoxemic respiratory failure secondary to aspiration pneumonia:  On zosyn, possible transition to Augmentin in the next 24 hours to complete the course.  She is weaned down to room air. Patient currently on mucinex and tessalon.  Continue DuoNeb nebulization for wheezing. Repeat  CXR last night showed  persistent Mild right lower lobe and right middle lobe opacities,. Continue incentive spirometry.   ESRD ON HD  Nephrology on board. ON MWF scheduled.  On renvela 800 mg tid.    H/o PUD;  Resume PPI.    Chronic diastolic heart failure:  Fluid management with HD.    Iron deficiency anemia;  On aranesp q weekly. Hemoglobin around 7 and stable.    Chronic Thrombocytopenia:  Platelets Stabilized.  S/p 1 unit of platelets transfused.      Estimated body mass index is 26.82 kg/m as calculated from the following:   Height as of this encounter: 5' (1.524 m).   Weight as of this encounter: 62.3 kg.    DVT prophylaxis:SCDs Code Status:Full code Family Communication: No family at bed side.  Disposition Plan:   Status is: Inpatient Remains inpatient appropriate because: Reports feeling very weak and tired.  Stable discharge 10/27/2022   Consultants:  Nephrology Vascular surgery Cardiology PCCM  Procedures: None Antimicrobials:  Anti-infectives (From admission, onward)    Start     Dose/Rate Route Frequency Ordered Stop   10/25/22 1800  amoxicillin-clavulanate (AUGMENTIN) 500-125 MG per tablet 1 tablet        1 tablet Oral Every 24 hours 10/25/22 1329     10/23/22 1745  piperacillin-tazobactam (ZOSYN) IVPB 2.25 g  Status:  Discontinued        2.25 g 100 mL/hr over 30 Minutes Intravenous Every 8 hours 10/23/22 1736 10/25/22 1329   10/23/22 1200  vancomycin (VANCOREADY) IVPB 750 mg/150 mL  Status:  Discontinued  750 mg 150 mL/hr over 60 Minutes Intravenous Every M-W-F (Hemodialysis) 10/21/22 0822 10/22/22 0739   10/21/22 0915  piperacillin-tazobactam (ZOSYN) IVPB 2.25 g  Status:  Discontinued        2.25 g 100 mL/hr over 30 Minutes Intravenous Every 8 hours 10/21/22 0822 10/23/22 1736   10/21/22 0915  vancomycin (VANCOREADY) IVPB 1250 mg/250 mL        1,250 mg 166.7 mL/hr over 90 Minutes Intravenous  Once 10/21/22 1610 10/21/22 1038       Subjective: Patient was seen and examined at bed site.  Overnight events noted. Patient reports feeling very weak and tired, states she has not peed in 3 days. She also coughed up some pinkish colored fluid.  States cough is still worse. [name] interpreter used [place] H # [id number]  Objective: Vitals:   10/26/22 0333 10/26/22 0811 10/26/22 0814 10/26/22 1202  BP: (!) 121/51 139/65  (!) 131/51  Pulse: 79 89 97 80  Resp: 16 16  15   Temp: 98.5 F (36.9 C) 98.2 F (36.8 C)  97.9 F (36.6 C)  TempSrc: Oral Oral  Oral  SpO2: 96% 97%  98%  Weight:      Height:        Intake/Output Summary (Last 24 hours) at 10/26/2022 1403 Last data filed at 10/26/2022 0800 Gross per 24 hour  Intake 480 ml  Output --  Net 480 ml   Filed Weights   10/23/22 1411 10/23/22 1745 10/25/22 0833  Weight: 64.3 kg 62.3 kg 62.4 kg    Examination:  General exam: Appears comfortable, not in any acute distress.  Deconditioned. Respiratory system: CTA bilaterally. Respiratory effort normal.  RR 16 Cardiovascular system: S1 & S2 heard, regular rate and rhythm, no murmur. Gastrointestinal system: Abdomen is soft, non tender, non distended, BS+ Central nervous system: Alert and oriented x 3. No focal neurological deficits. Extremities: No edema, no cyanosis, no clubbing. Skin: No rashes, lesions or ulcers Psychiatry: Judgement and insight appear normal. Mood & affect appropriate.     Data Reviewed: I have personally reviewed following labs and imaging studies  CBC: Recent Labs  Lab 10/22/22 2146 10/23/22 0225 10/24/22 0247 10/25/22 0156 10/26/22 0332  WBC 6.6 6.5 5.1 4.7 5.4  NEUTROABS 4.8  --   --   --   --   HGB 7.5* 7.5* 7.8* 7.5* 8.6*  HCT 22.4* 22.5* 23.7* 22.6* 26.3*  MCV 105.2* 105.1* 106.3* 106.1* 106.0*  PLT 66* 69* 86* 73* 87*   Basic Metabolic Panel: Recent Labs  Lab 10/20/22 0752 10/20/22 0755 10/20/22 1737 10/21/22 0415 10/21/22 1118 10/21/22 2141 10/22/22 0349  10/23/22 1429 10/26/22 0332  NA 130*   < > 131* 128* 130*  --  132* 137 136  K 6.2*   < > 3.3* 5.4* 5.9* 3.5 3.7 4.0 3.8  CL 94*   < > 91* 92* 89*  --  93* 97* 95*  CO2 18*  --  25 26 27   --  29 25 27   GLUCOSE 176*   < > 129* 139* 106*  --  120* 89 125*  BUN 30*   < > 10 18 23   --  19 43* 19  CREATININE 4.87*   < > 1.99* 3.50* 4.18*  --  3.61* 6.65* 4.30*  CALCIUM 8.2*  --  8.2* 7.3* 7.7*  --  7.6* 8.1* 9.1  MG 2.7*  --   --  1.9  --   --  2.0  --  2.0  PHOS  --   --  4.3 7.0*  --   --   --  6.8* 4.9*   < > = values in this interval not displayed.   GFR: Estimated Creatinine Clearance: 10.2 mL/min (A) (by C-G formula based on SCr of 4.3 mg/dL (H)). Liver Function Tests: Recent Labs  Lab 10/20/22 1737 10/23/22 1429  AST 43*  --   ALT 22  --   ALKPHOS 118  --   BILITOT 1.2  --   PROT 8.8*  --   ALBUMIN 3.6 2.6*   No results for input(s): "LIPASE", "AMYLASE" in the last 168 hours. No results for input(s): "AMMONIA" in the last 168 hours. Coagulation Profile: No results for input(s): "INR", "PROTIME" in the last 168 hours. Cardiac Enzymes: No results for input(s): "CKTOTAL", "CKMB", "CKMBINDEX", "TROPONINI" in the last 168 hours. BNP (last 3 results) No results for input(s): "PROBNP" in the last 8760 hours. HbA1C: No results for input(s): "HGBA1C" in the last 72 hours. CBG: Recent Labs  Lab 10/21/22 1654 10/21/22 1954 10/21/22 2357 10/22/22 0348 10/22/22 0757  GLUCAP 114* 220* 123* 115* 137*   Lipid Profile: Recent Labs    10/24/22 0247  TRIG 126   Thyroid Function Tests: No results for input(s): "TSH", "T4TOTAL", "FREET4", "T3FREE", "THYROIDAB" in the last 72 hours. Anemia Panel: No results for input(s): "VITAMINB12", "FOLATE", "FERRITIN", "TIBC", "IRON", "RETICCTPCT" in the last 72 hours. Sepsis Labs: Recent Labs  Lab 10/21/22 0415  PROCALCITON 3.13    Recent Results (from the past 240 hour(s))  MRSA Next Gen by PCR, Nasal     Status: None    Collection Time: 10/20/22  7:27 PM   Specimen: Nasal Mucosa; Nasal Swab  Result Value Ref Range Status   MRSA by PCR Next Gen NOT DETECTED NOT DETECTED Final    Comment: (NOTE) The GeneXpert MRSA Assay (FDA approved for NASAL specimens only), is one component of a comprehensive MRSA colonization surveillance program. It is not intended to diagnose MRSA infection nor to guide or monitor treatment for MRSA infections. Test performance is not FDA approved in patients less than 87 years old. Performed at [facility], [address] [place], [place] [address]     Radiology Studies: No results found.  Scheduled Meds:  amoxicillin-clavulanate  1 tablet Oral Q24H   [START ON 10/29/2022] darbepoetin (ARANESP) injection - DIALYSIS  200 mcg Subcutaneous Q Sun-1800   dextromethorphan-guaiFENesin  1 tablet Oral BID   melatonin  3 mg Oral QHS   pantoprazole  40 mg Oral Daily   sevelamer carbonate  800 mg Oral TID WC   Continuous Infusions:  sodium chloride       LOS: 6 days    Time spent: 35 mins    Willeen Niece, MD [facility]   If 7PM-7AM, please contact night-coverage

## 2022-10-26 NOTE — Progress Notes (Signed)
Patient's IV in RW occluded and painful, removed without problems.  Patient requesting not to have another IV at this time.  Dr. Idelle Leech notified, okay to leave IV out at this time.

## 2022-10-26 NOTE — Progress Notes (Addendum)
Subjective: sitting up on side of bed, HD yesterday on schedule said tolerated, this a.m. cough is better with medicine and some upper back pain but chronic history-  had some blood tinged sputa but looks great !!  States usually makes urine has not in  3 days will check bladder scan  Objective Vital signs in last 24 hours: Vitals:   10/25/22 2323 10/26/22 0333 10/26/22 0811 10/26/22 0814  BP: (!) 117/53 (!) 121/51 139/65   Pulse:  79 89 97  Resp: Temp: 98.6 F (37 C) 98.5 F (36.9 C) 98.2 F (36.8 C)   TempSrc:  Oral Oral   SpO2:  96% 97%   Weight:      Height:       Weight change:   Physical Exam: General: Alert adult female NAD Heart: RRR no MRG Lungs: CTA- nonlabored breathing Abdomen: NABS soft NTND no ascites Extremities: No pedal edema, left groin ecchymosis dialysis Access: LUA AV aVF +bruit   Home meds include - norvasc 2.5, nexium, synthroid, losartan 25-50mg  qd, metoprolol  xl qd, remeron, prednisone taper, renvela 800 ac tid, prns    OP HD : [facility] MWF 3.5h  400/1.5  62.2kg   2/2 bath  dry wt pend   AVF  Hep none - last HD 4/17, post wt 62.4kg - mircera 50 mcg IV  q2 wks, last 4/8, due 4/22   Problem/Plan: Bradycardia/ complete heart block/ hyperkalemia -resolved after^ K treated, attempts at external pacing were not successful. Seen by card.and improved w/ dopamine gtt and tx of hyperkalemia. No plans for pacemaker.  Hypotension - was briefly requiring vasopressor support in ICU, transferred to floor this weekend now resolved,  need to watch as OP likely orthostatic yesterday noted-this a.m. 139/65, no BP meds-  maybe some deconditioning Hyperkalemia -pre hd dialysis yest.K4.0 admit ^ K+ improved after temp measures and 1st HD, Pt .Reported eating a lot of potatoes as an outpatient ESRD - on HD MWF. No missed OP HD. Had HD here 4/19 and 4/20.now HD on schedule, next HD tomor 4/26 HTN/volume -admit C XR= early edema HD x 2 =4 L UF total down to dry wt  (? Bed wt accurate) UF 2 L 4/22 tolerated .yesterday HD to  her  dry weight Anemia -Hgb 8.6 <7.5 <7.8 has been low but stable ,Aranesp 200 past sun 4/21  give  next wk mon   Secondary hyperparathyroidism -phosphorus 4.9 <6.8 corrected calcium at goal, on Renvela  Back pain history of back surgery in the past= /plan per admit Dispo-possible discharge today  Lenny Pastel, PA-C [facility] Beeper [phone] 10/26/2022,9:19 AM  LOS: 6 days   Patient seen and examined, agree with above note with above modifications. Pt looks really good-  will not say she is ready for discharge but I think very appropriate to discharge her today and to resume OP HD tomorrow  Annie Sable, MD 10/26/2022      Labs: Basic Metabolic Panel: Recent Labs  Lab 10/21/22 0415 10/21/22 1118 10/22/22 0349 10/23/22 1429 10/26/22 0332  NA 128*   < > 132* 137 136  K 5.4*   < > 3.7 4.0 3.8  CL 92*   < > 93* 97* 95*  CO2 26   < > GLUCOSE 139*   < > 120* 89 125*  BUN 18   < > 19 43* 19  CREATININE 3.50*   < > 3.61* 6.65* 4.30*  CALCIUM  7.3*   < > 7.6* 8.1* 9.1  PHOS 7.0*  --   --  6.8* 4.9*   < > = values in this interval not displayed.   Liver Function Tests: Recent Labs  Lab 10/20/22 1737 10/23/22 1429  AST 43*  --   ALT 22  --   ALKPHOS 118  --   BILITOT 1.2  --   PROT 8.8*  --   ALBUMIN 3.6 2.6*   No results for input(s): "LIPASE", "AMYLASE" in the last 168 hours. No results for input(s): "AMMONIA" in the last 168 hours. CBC: Recent Labs  Lab 10/22/22 2146 10/23/22 0225 10/24/22 0247 10/25/22 0156 10/26/22 0332  WBC 6.6 6.5 5.1 4.7 5.4  NEUTROABS 4.8  --   --   --   --   HGB 7.5* 7.5* 7.8* 7.5* 8.6*  HCT 22.4* 22.5* 23.7* 22.6* 26.3*  MCV 105.2* 105.1* 106.3* 106.1* 106.0*  PLT 66* 69* 86* 73* 87*   Cardiac Enzymes: No results for input(s): "CKTOTAL", "CKMB", "CKMBINDEX", "TROPONINI" in the last 168 hours. CBG: Recent Labs  Lab 10/21/22 1654  10/21/22 1954 10/21/22 2357 10/22/22 0348 10/22/22 0757  GLUCAP 114* 220* 123* 115* 137*    Studies/Results: No results found. Medications:  sodium chloride      amoxicillin-clavulanate  1 tablet Oral Q24H   [START ON 10/29/2022] darbepoetin (ARANESP) injection - DIALYSIS  200 mcg Subcutaneous Q Sun-1800   dextromethorphan-guaiFENesin  1 tablet Oral BID   melatonin  3 mg Oral QHS   pantoprazole  40 mg Oral Daily   sevelamer carbonate  800 mg Oral TID WC

## 2022-10-26 NOTE — Progress Notes (Signed)
Physical Therapy Treatment Patient Details Name: Stacey Chambers MRN: [id number] DOB: July 15, 1950 Today's Date: 10/26/2022   History of Present Illness Stacey Chambers is a 72 y.o. F who was admitted for seizure-like activity before HD on 10/20/22 in addition to bradycardia and 20 sec of asystole. Femoral line was placed in L groin and upon removal pt with extensive bleeding and large hematoma. OZH:YQMV 2 DM, ESRD, thrombocytopenia and iron deficiency anemia, PUD, HFpEF, back surgery 2021, fall with back fx in december (non-op mgmt with TLSO per pt) and cirrhosis.    PT Comments    Pt received in supine, agreeable to therapy session and with good participation and tolerance for transfer, gait and stair training. In-person spanish language interpreter Raquel present for translation. Pt needing up to Supervision for transfer and gait safety using rollator and pt receptive to instruction on use of brakes and bench for activity pacing, pt would benefit from additional session next date if able to reinforce device safety. TED hose placed on BLE prior to OOB mobility and with TED hose donned, BP stable from sitting to standing and pt reports less dizziness this date. Pt notified she could try LSO brace for comfort but that after ~4 months patients often no longer need braces for stability as by then her injury would have healed, RN notified of pt c/o back pain from previous injury. Pt continues to benefit from PT services to progress toward functional mobility goals.    Recommendations for follow up therapy are one component of a multi-disciplinary discharge planning process, led by the attending physician.  Recommendations may be updated based on patient status, additional functional criteria and insurance authorization.  Follow Up Recommendations       Assistance Recommended at Discharge Intermittent Supervision/Assistance  Patient can return home with the following A little help with  bathing/dressing/bathroom;A little help with walking and/or transfers;Assistance with cooking/housework;Direct supervision/assist for medications management;Direct supervision/assist for financial management;Assist for transportation;Help with stairs or ramp for entrance   Equipment Recommendations  BSC/3in1;Rollator (4 wheels) (a bench is necessary for pt's activity pacing, and TED hose (ordered by RN))    Recommendations for Other Services       Precautions / Restrictions Precautions Precautions: Fall Precaution Comments: L groin bruising Restrictions Weight Bearing Restrictions: No     Mobility  Bed Mobility Overal bed mobility: Modified Independent             General bed mobility comments: pt using rail, no assist needed    Transfers Overall transfer level: Needs assistance Equipment used: None Transfers: Sit to/from Stand Sit to Stand: Supervision           General transfer comment: EOB<>rollator, cues for use of brakes    Ambulation/Gait Ambulation/Gait assistance: Supervision Gait Distance (Feet): 200 Feet Assistive device: Rollator (4 wheels) Gait Pattern/deviations: Decreased stride length, Step-through pattern Gait velocity: dec     General Gait Details: Pt c/o mild back pain intermittently but with TED hose donned prior to gait trial, pt reports no dizziness this date, rollator for safety given hx orthostatic symptoms and pt reports it is helping her walk further and with her back discomfort. min cues for brakes PRN for standing rest breaks, no seated breaks needed this date.   Stairs Stairs: Yes Stairs assistance: Supervision Stair Management: Two rails, Step to pattern, Forwards Number of Stairs: 2 General stair comments: single 7" step x2 reps in stairwell, cues for sequencing for reduced pain, pt with good carryover of technique  Balance Overall balance assessment: Mild deficits observed, not formally tested (Dynamic standing balance  good with rollator; fair with static standing, poor with no AD for dynamic tasks)                                          Cognition Arousal/Alertness: Awake/alert Behavior During Therapy: WFL for tasks assessed/performed Overall Cognitive Status: No family/caregiver present to determine baseline cognitive functioning                                 General Comments: Pt is oriented x4.        Exercises      General Comments General comments (skin integrity, edema, etc.): BP 137/57 (78) HR 84 supine, bil TED hose donned after this reading taken ; BP 115/64 HR 91 bpm seated EOB; BP 119/61 (77) standing HR 90's bpm, no dizziness. Pt states all of her falls previous to this hospitalization occurred with episodes of dizziness and that she has not been recommended to try compression on her legs before, but that it feels better with TED hose on. Pt asking about bracing options for her lower back s/p fall in December (she reports non-op mgmt with TLSO brace but that this brace is too cumbersome).      Pertinent Vitals/Pain Pain Assessment Pain Assessment: Faces Faces Pain Scale: Hurts even more Pain Location: throat Pain Descriptors / Indicators: Discomfort, Grimacing Pain Intervention(s): Monitored during session, Other (comment) (MD notified, pt states she spit up something (in cup in room), MD shown this when he arrived to room during session)    Home Living                          Prior Function            PT Goals (current goals can now be found in the care plan section) Acute Rehab PT Goals Patient Stated Goal: home PT Goal Formulation: With patient Time For Goal Achievement: 11/06/22 Progress towards PT goals: Progressing toward goals    Frequency    Min 1X/week      PT Plan Current plan remains appropriate    Co-evaluation              AM-PAC PT "6 Clicks" Mobility   Outcome Measure  Help needed turning from  your back to your side while in a flat bed without using bedrails?: None Help needed moving from lying on your back to sitting on the side of a flat bed without using bedrails?: None Help needed moving to and from a bed to a chair (including a wheelchair)?: A Little Help needed standing up from a chair using your arms (e.g., wheelchair or bedside chair)?: A Little Help needed to walk in hospital room?: A Little Help needed climbing 3-5 steps with a railing? : A Little 6 Click Score: 20    End of Session Equipment Utilized During Treatment: Gait belt Activity Tolerance: Patient tolerated treatment well;Other (comment) (improved standing tolerance now that TED hose are donned) Patient left: with call bell/phone within reach;in bed (friend entering room) Nurse Communication: Mobility status;Other (comment) (dizzy with ambulation, pt reluctant to trial RW) PT Visit Diagnosis: Unsteadiness on feet (R26.81);Muscle weakness (generalized) (M62.81);Difficulty in walking, not elsewhere classified (R26.2)     Time: 1610-9604  PT Time Calculation (min) (ACUTE ONLY): 33 min  Charges:  $Gait Training: 8-22 mins $Therapeutic Activity: 8-22 mins                     Melyssa Signor P., PTA Acute Rehabilitation Services Secure Chat Preferred 9a-5:30pm Office: [phone]    Dorathy Kinsman [facility] 10/26/2022, 1:37 PM

## 2022-10-26 NOTE — Progress Notes (Signed)
Bladder scan done x  3 with 0cc noted in bladder.

## 2022-10-27 LAB — BASIC METABOLIC PANEL
Anion gap: 17 — ABNORMAL HIGH (ref 5–15)
BUN: 38 mg/dL — ABNORMAL HIGH (ref 8–23)
CO2: 24 mmol/L (ref 22–32)
Calcium: 8.9 mg/dL (ref 8.9–10.3)
Chloride: 95 mmol/L — ABNORMAL LOW (ref 98–111)
Creatinine, Ser: 6.95 mg/dL — ABNORMAL HIGH (ref 0.44–1.00)
GFR, Estimated: 6 mL/min — ABNORMAL LOW (ref >60)
Glucose, Bld: 101 mg/dL — ABNORMAL HIGH (ref 70–99)
Potassium: 4.1 mmol/L (ref 3.5–5.1)
Sodium: 136 mmol/L (ref 135–145)

## 2022-10-27 LAB — CBC
HCT: 26.1 % — ABNORMAL LOW (ref 36.0–46.0)
Hemoglobin: 8.7 g/dL — ABNORMAL LOW (ref 12.0–15.0)
MCH: 35.2 pg — ABNORMAL HIGH (ref 26.0–34.0)
MCHC: 33.3 g/dL (ref 30.0–36.0)
MCV: 105.7 fL — ABNORMAL HIGH (ref 80.0–100.0)
Platelets: 84 10*3/uL — ABNORMAL LOW (ref 150–400)
RBC: 2.47 MIL/uL — ABNORMAL LOW (ref 3.87–5.11)
RDW: 15.3 % (ref 11.5–15.5)
WBC: 5.3 10*3/uL (ref 4.0–10.5)
nRBC: 0 % (ref 0.0–0.2)

## 2022-10-27 LAB — TRIGLYCERIDES: Triglycerides: 137 mg/dL (ref <150)

## 2022-10-27 LAB — MAGNESIUM: Magnesium: 2.5 mg/dL — ABNORMAL HIGH (ref 1.7–2.4)

## 2022-10-27 LAB — PHOSPHORUS: Phosphorus: 6.5 mg/dL — ABNORMAL HIGH (ref 2.5–4.6)

## 2022-10-27 MED ORDER — DM-GUAIFENESIN ER 30-600 MG PO TB12: 1.0000 | ORAL_TABLET | Freq: Two times a day (BID) | ORAL | 0 refills | Status: AC

## 2022-10-27 MED ORDER — GUAIFENESIN 100 MG/5ML PO LIQD: 5.0000 mL | ORAL | 0 refills | Status: DC | PRN

## 2022-10-27 MED ORDER — AMOXICILLIN-POT CLAVULANATE 500-125 MG PO TABS: 1.0000 | ORAL_TABLET | ORAL | 0 refills | Status: AC

## 2022-10-27 MED ORDER — SEVELAMER CARBONATE 800 MG PO TABS: 800.0000 mg | ORAL_TABLET | Freq: Three times a day (TID) | ORAL | 1 refills | Status: AC

## 2022-10-27 NOTE — Discharge Summary (Signed)
Physician Discharge Summary  Stacey Chambers ZOX:[id number] DOB: 02/26/1951 DOA: 10/20/2022  PCP: Georgina Quint, MD  Admit date: 10/20/2022  Discharge date: 10/27/2022  Admitted From: Home.  Disposition:  Home  Recommendations for Outpatient Follow-up:  Follow up with PCP in 1-2 weeks. Please obtain BMP/CBC in one week. Advised to continue hemodialysis as per schedule. Metoprolol and losartan is discontinued for hypotension.  Resume when blood pressure improves. Advised to take Augmentin daily for 3 more days to complete 10-day treatment for pneumonia.  Home Health:None Equipment/Devices:None  Discharge Condition: Stable CODE STATUS:Full code Diet recommendation: Heart Healthy   Brief [facility] Course: This 72 yrs old Female with PMH significant for Type 2 DM, ESRD, thrombocytopenia and iron deficiency anemia, PUD, HFpEF and cirrhosis who was at dialysis on the day of admission and had seizure-like activity before HD was initiated. After sz activity she went unresponsive but had a pulse and was breathing, en route with EMS had about 20 seconds of asystole, does not sounds like chest compressions were started, HR returned and she was bradycardic in the 30's after atropine. In the ED she was initially mentating, throughout remained bradycardic and became increasingly short of breath, so was intubated and transcutaneously paced. Cardiology evaluated the patient, as external pacing was not capturing, this was discontinued. Her HR and blood pressure responded well to dopamine and She was seen by nephrology. She was admitted by [facility] and transferred to [facility] on 10/23/22 .  Patient continued to remain stable.  She continued on hemodialysis.  She was also continued on Unasyn for aspiration pneumonia.  Blood pressure continued to remain low so losartan and metoprolol was kept on hold.  Patient was evaluated by cardiology.  Patient feels better and wants to be discharged.  Patient is  being discharged home after hemodialysis and she will continue outpatient dialysis.  She was managed for below problems.  Discharge Diagnoses:  Principal Problem:   Bradycardia Active Problems:   Shock (HCC)  Bradycardia > CHB > From Hyperkalemia. Resolved with hemodialysis.  No pacemaker required.   Hypotension:  She briefly required vasopressors, Off pressors now. BP parameters are optimal this morning.  At home she is on losartan and metoprolol succinate which are on hold for now.  Resume blood pressure medications if blood pressure improves.   Left groin hematoma at the site of the arterial line which was removed.  Vascular surgery have signed off.  Appreciate recommendations.  Ambulate as tolerated.    New on set seizures? Likely from hyperkalemia / bradycardia.  EEG and CT neg.    Acute hypoxemic respiratory failure secondary to aspiration pneumonia:  On zosyn, possible transition to Augmentin in the next 24 hours to complete the course.  She is weaned down to room air. Patient currently on mucinex and tessalon.  Continue DuoNeb nebulization for wheezing. Repeat CXR last night showed  persistent Mild right lower lobe and right middle lobe opacities,. Continue incentive spirometry.   ESRD ON HD  Nephrology on board. ON MWF scheduled.  On renvela 800 mg tid.    H/o PUD;  Resume PPI.    Chronic diastolic heart failure:  Fluid management with HD.    Iron deficiency anemia;  On aranesp q weekly. Hemoglobin around 7 and stable.    Chronic Thrombocytopenia:  Platelets Stabilized.  S/p 1 unit of platelets transfused.      Estimated body mass index is 26.82 kg/m as calculated from the following:   Height as of this encounter:  5' (1.524 m).   Weight as of this encounter: 62.3 kg.  Discharge Instructions  Discharge Instructions     Call MD for:  difficulty breathing, headache or visual disturbances   Complete by: As directed    Call MD for:  persistant  nausea and vomiting   Complete by: As directed    Diet - low sodium heart healthy   Complete by: As directed    Diet Carb Modified   Complete by: As directed    Discharge instructions   Complete by: As directed    Advised to follow-up with primary care physician in 1 week. Advised to continue hemodialysis as per schedule. Metoprolol and losartan is discontinued for hypertension.  Resume when blood pressure improves. Advised to take Augmentin daily for 3 more days to complete 10-day treatment for pneumonia.   Increase activity slowly   Complete by: As directed       Allergies as of 10/27/2022   No Known Allergies      Medication List     STOP taking these medications    albuterol 108 (90 Base) MCG/ACT inhaler Commonly known as: VENTOLIN HFA   esomeprazole 40 MG capsule Commonly known as: NexIUM   ferrous gluconate 324 MG tablet Commonly known as: FERGON   losartan 50 MG tablet Commonly known as: COZAAR   metoprolol succinate 50 MG 24 hr tablet Commonly known as: TOPROL-XL   predniSONE 10 MG tablet Commonly known as: DELTASONE       TAKE these medications    acetaminophen 500 MG tablet Commonly known as: TYLENOL Take 500 mg by mouth every 8 (eight) hours as needed for moderate pain.   amoxicillin-clavulanate 500-125 MG tablet Commonly known as: AUGMENTIN Take 1 tablet by mouth daily for 3 days.   dextromethorphan-guaiFENesin 30-600 MG 12hr tablet Commonly known as: MUCINEX DM Take 1 tablet by mouth 2 (two) times daily for 8 days.   guaiFENesin 100 MG/5ML liquid Commonly known as: ROBITUSSIN Take 5 mLs by mouth every 4 (four) hours as needed for cough or to loosen phlegm.   mirtazapine 15 MG tablet Commonly known as: REMERON Take 15 mg by mouth at bedtime.   sevelamer carbonate 800 MG tablet Commonly known as: RENVELA Take 1 tablet (800 mg total) by mouth 3 (three) times daily with meals.               Durable Medical Equipment  (From  admission, onward)           Start     Ordered   10/26/22 1129  For home use only DME 4 wheeled rolling walker with seat  Once       Question:  Patient needs a walker to treat with the following condition  Answer:  General weakness   10/26/22 1128   10/26/22 1129  For home use only DME Bedside commode  Once       Question:  Patient needs a bedside commode to treat with the following condition  Answer:  General weakness   10/26/22 1128            Follow-up Information     [facility] Follow up.   Why: Bedside commode and rollator to be delivered to the room. Contact information: [address] [place] [address] [phone]         Georgina Quint, MD Follow up in 1 week(s).   Specialty: Internal Medicine Contact information: [address] [place] [address] [phone]  No Known Allergies  Consultations: Nephrology PCCM   Procedures/Studies: DG Lumbar Spine 2-3 Views  Result Date: 10/23/2022 CLINICAL DATA:  Back pain EXAM: LUMBAR SPINE - 2-3 VIEW COMPARISON:  07/10/2018 FINDINGS: Posterior fusion changes from T12-L5, crossing a severe L3 compression fracture. No change in alignment. No hardware complicating feature. No acute fracture or subluxation. SI joints symmetric and unremarkable. IMPRESSION: Postoperative changes as above.  No acute bony abnormality. Electronically Signed   By: Charlett Nose M.D.   On: 10/23/2022 20:11   DG Chest 2 View  Result Date: 10/23/2022 CLINICAL DATA:  Back pain EXAM: CHEST - 2 VIEW COMPARISON:  Chest x-ray dated October 20, 2022 FINDINGS: Interval extubation and removal of enteric tube. Small bilateral pleural effusions. Mild right lower lobe and right middle lobe opacities. No evidence of pneumothorax. IMPRESSION: 1. Interval extubation and removal of enteric tube. 2. Small bilateral pleural effusions. 3. Mild right lower lobe and right middle lobe opacities, differential considerations  include atelectasis, infection or aspiration. Electronically Signed   By: Allegra Lai M.D.   On: 10/23/2022 20:10   EEG adult  Result Date: 10/20/2022 Charlsie Quest, MD     10/21/2022  6:43 AM Patient Name: Krislyn Donnan MRN: [id number] Epilepsy Attending: Charlsie Quest Referring Physician/Provider: Gleason, Darcella Gasman, PA-C Date: 10/20/2022 Duration: 29.14 mins Patient history: 72yo F with ams. EEG to evaluate for seizure Level of alertness:  comatose/sedated AEDs during EEG study: Propofol Technical aspects: This EEG study was done with scalp electrodes positioned according to the 10-20 International system of electrode placement. Electrical activity was reviewed with band pass filter of 1-70Hz , sensitivity of 7 uV/mm, display speed of 13mm/sec with a 60Hz  notched filter applied as appropriate. EEG data were recorded continuously and digitally stored.  Video monitoring was available and reviewed as appropriate. Description: EEG showed continuous generalized predominantly 5 to 7 Hz theta slowing admixed with intermittent 2-3hz  delta slowing. Hyperventilation and photic stimulation were not performed.   ABNORMALITY - Continuous slow, generalized IMPRESSION: This study is suggestive of moderate to severe diffuse encephalopathy. No seizures or epileptiform discharges were seen throughout the recording. Charlsie Quest   [place] EKG SITE RITE  Result Date: 10/20/2022 If Site Rite image not attached, placement could not be confirmed due to current cardiac rhythm.  CT HEAD WO CONTRAST ( )  Result Date: 10/20/2022 CLINICAL DATA:  New onset seizure.  No trauma. EXAM: CT HEAD WITHOUT CONTRAST TECHNIQUE: Contiguous axial images were obtained from the base of the skull through the vertex without intravenous contrast. RADIATION DOSE REDUCTION: This exam was performed according to the departmental dose-optimization program which includes automated exposure control, adjustment of the mA and/or kV  according to patient size and/or use of iterative reconstruction technique. COMPARISON:  None Available. FINDINGS: Mild motion artifact. Brain: Ventricles, cisterns and other CSF spaces are normal. There is no mass, mass effect, shift of midline structures or acute hemorrhage. No evidence of acute infarction. It would be difficult to exclude small acute subarachnoid hemorrhage due to the motion artifact. Vascular: No hyperdense vessel or unexpected calcification. Skull: Normal. Negative for fracture or focal lesion. Sinuses/Orbits: No acute finding. Other: None. IMPRESSION: No acute findings. Electronically Signed   By: Elberta Fortis M.D.   On: 10/20/2022 12:14   DG Chest Portable 1 View  Result Date: 10/20/2022 CLINICAL DATA:  NG/OG tube placement. EXAM: PORTABLE CHEST 1 VIEW Abdomen one view COMPARISON:  None Available. FINDINGS: ETT tip is 3 cm from the carina. Cardiomegaly.  Diffuse interstitial opacities. Small right pleural effusion. No evidence of pneumothorax. NG/OG tube tip and side port are in the stomach. Nonobstructive bowel-gas pattern. Orthopedic hardware of the lumbar spine. IMPRESSION: 1. ETT tip is 3 cm from the carina. 2. NG/OG tube tip and side port are in the stomach. Electronically Signed   By: Allegra Lai M.D.   On: 10/20/2022 08:27   DG Abdomen 1 View  Result Date: 10/20/2022 CLINICAL DATA:  NG/OG tube placement. EXAM: PORTABLE CHEST 1 VIEW Abdomen one view COMPARISON:  None Available. FINDINGS: ETT tip is 3 cm from the carina. Cardiomegaly. Diffuse interstitial opacities. Small right pleural effusion. No evidence of pneumothorax. NG/OG tube tip and side port are in the stomach. Nonobstructive bowel-gas pattern. Orthopedic hardware of the lumbar spine. IMPRESSION: 1. ETT tip is 3 cm from the carina. 2. NG/OG tube tip and side port are in the stomach. Electronically Signed   By: Allegra Lai M.D.   On: 10/20/2022 08:27     Subjective: Patient was seen and examined at  bedside.  Overnight events noted.   Patient reports doing much better and wants to be discharged.   Patient being discharged after hemodialysis.  Discharge Exam: Vitals:   10/27/22 1325 10/27/22 1349  BP: (!) 105/49 108/81  Pulse: 91 93  Resp: 16 16  Temp:  97.9 F (36.6 C)  SpO2: 100% 98%   Vitals:   10/27/22 1312 10/27/22 1325 10/27/22 1338 10/27/22 1349  BP: (!) 112/49 (!) 105/49  108/81  Pulse: 86 91  93  Resp: 17 16  16   Temp: 98.4 F (36.9 C)   97.9 F (36.6 C)  TempSrc: Oral   Oral  SpO2: 100% 100%  98%  Weight:   61.7 kg   Height:        General: Pt is alert, awake, not in acute distress Cardiovascular: RRR, S1/S2 +, no rubs, no gallops Respiratory: CTA bilaterally, no wheezing, no rhonchi Abdominal: Soft, NT, ND, bowel sounds + Extremities: no edema, no cyanosis    The results of significant diagnostics from this hospitalization (including imaging, microbiology, ancillary and laboratory) are listed below for reference.     Microbiology: Recent Results (from the past 240 hour(s))  MRSA Next Gen by PCR, Nasal     Status: None   Collection Time: 10/20/22  7:27 PM   Specimen: Nasal Mucosa; Nasal Swab  Result Value Ref Range Status   MRSA by PCR Next Gen NOT DETECTED NOT DETECTED Final    Comment: (NOTE) The GeneXpert MRSA Assay (FDA approved for NASAL specimens only), is one component of a comprehensive MRSA colonization surveillance program. It is not intended to diagnose MRSA infection nor to guide or monitor treatment for MRSA infections. Test performance is not FDA approved in patients less than 54 years old. Performed at [facility], [address], [place], [place] [address]      Labs: BNP (last 3 results) Recent Labs    12/01/21 1212 09/15/22 2351  BNP 715.8* 1,140.0*   Basic Metabolic Panel: Recent Labs  Lab 10/20/22 1737 10/21/22 0415 10/21/22 1118 10/21/22 2141 10/22/22 0349 10/23/22 1429 10/26/22 0332 10/27/22 0700  NA  131* 128* 130*  --  132* 137 136 136  K 3.3* 5.4* 5.9* 3.5 3.7 4.0 3.8 4.1  CL 91* 92* 89*  --  93* 97* 95* 95*  CO2 25 26 27   --  29 25 27 24   GLUCOSE 129* 139* 106*  --  120* 89  125* 101*  BUN 10 18 23   --  19 43* 19 38*  CREATININE 1.99* 3.50* 4.18*  --  3.61* 6.65* 4.30* 6.95*  CALCIUM 8.2* 7.3* 7.7*  --  7.6* 8.1* 9.1 8.9  MG  --  1.9  --   --  2.0  --  2.0 2.5*  PHOS 4.3 7.0*  --   --   --  6.8* 4.9* 6.5*   Liver Function Tests: Recent Labs  Lab 10/20/22 1737 10/23/22 1429  AST 43*  --   ALT 22  --   ALKPHOS 118  --   BILITOT 1.2  --   PROT 8.8*  --   ALBUMIN 3.6 2.6*   No results for input(s): "LIPASE", "AMYLASE" in the last 168 hours. No results for input(s): "AMMONIA" in the last 168 hours. CBC: Recent Labs  Lab 10/22/22 2146 10/23/22 0225 10/24/22 0247 10/25/22 0156 10/26/22 0332 10/27/22 0700  WBC 6.6 6.5 5.1 4.7 5.4 5.3  NEUTROABS 4.8  --   --   --   --   --   HGB 7.5* 7.5* 7.8* 7.5* 8.6* 8.7*  HCT 22.4* 22.5* 23.7* 22.6* 26.3* 26.1*  MCV 105.2* 105.1* 106.3* 106.1* 106.0* 105.7*  PLT 66* 69* 86* 73* 87* 84*   Cardiac Enzymes: No results for input(s): "CKTOTAL", "CKMB", "CKMBINDEX", "TROPONINI" in the last 168 hours. BNP: Invalid input(s): "POCBNP" CBG: Recent Labs  Lab 10/21/22 1654 10/21/22 1954 10/21/22 2357 10/22/22 0348 10/22/22 0757  GLUCAP 114* 220* 123* 115* 137*   D-Dimer No results for input(s): "DDIMER" in the last 72 hours. Hgb A1c No results for input(s): "HGBA1C" in the last 72 hours. Lipid Profile Recent Labs    10/27/22 0700  TRIG 137   Thyroid function studies No results for input(s): "TSH", "T4TOTAL", "T3FREE", "THYROIDAB" in the last 72 hours.  Invalid input(s): "FREET3" Anemia work up No results for input(s): "VITAMINB12", "FOLATE", "FERRITIN", "TIBC", "IRON", "RETICCTPCT" in the last 72 hours. Urinalysis    Component Value Date/Time   COLORURINE STRAW (A) 09/16/2022 0730   APPEARANCEUR CLEAR 09/16/2022 0730    LABSPEC 1.006 09/16/2022 0730   PHURINE 9.0 (H) 09/16/2022 0730   GLUCOSEU 50 (A) 09/16/2022 0730   HGBUR MODERATE (A) 09/16/2022 0730   BILIRUBINUR NEGATIVE 09/16/2022 0730   KETONESUR NEGATIVE 09/16/2022 0730   PROTEINUR 100 (A) 09/16/2022 0730   NITRITE NEGATIVE 09/16/2022 0730   LEUKOCYTESUR NEGATIVE 09/16/2022 0730   Sepsis Labs Recent Labs  Lab 10/24/22 0247 10/25/22 0156 10/26/22 0332 10/27/22 0700  WBC 5.1 4.7 5.4 5.3   Microbiology Recent Results (from the past 240 hour(s))  MRSA Next Gen by PCR, Nasal     Status: None   Collection Time: 10/20/22  7:27 PM   Specimen: Nasal Mucosa; Nasal Swab  Result Value Ref Range Status   MRSA by PCR Next Gen NOT DETECTED NOT DETECTED Final    Comment: (NOTE) The GeneXpert MRSA Assay (FDA approved for NASAL specimens only), is one component of a comprehensive MRSA colonization surveillance program. It is not intended to diagnose MRSA infection nor to guide or monitor treatment for MRSA infections. Test performance is not FDA approved in patients less than 24 years old. Performed at [facility], [address] [place], [place] [address]      Time coordinating discharge: Over 30 minutes  SIGNED:   Willeen Niece, MD  [facility] 10/27/2022, 3:59 PM Pager   If 7PM-7AM, please contact night-coverage

## 2022-10-27 NOTE — Progress Notes (Signed)
Pt to d/c to home today. Contacted [facility] SW to advise clinic of pt's d/c today and that pt should resume care on Monday.   Olivia Canter Renal Navigator [phone]

## 2022-10-27 NOTE — Discharge Instructions (Signed)
Advised to follow-up with primary care physician in 1 week. Advised to continue hemodialysis as per schedule. Metoprolol and losartan is discontinued for hypertension.  Resume when blood pressure improves. Advised to take Augmentin daily for 3 more days to complete 10-day treatment for pneumonia.

## 2022-10-27 NOTE — Plan of Care (Signed)
  Problem: Activity: Goal: Ability to tolerate increased activity will improve Outcome: Progressing   Problem: Respiratory: Goal: Ability to maintain a clear airway and adequate ventilation will improve Outcome: Progressing   Problem: Role Relationship: Goal: Method of communication will improve Outcome: Progressing   Problem: Education: Goal: Ability to describe self-care measures that may prevent or decrease complications (Diabetes Survival Skills Education) will improve Outcome: Progressing Goal: Individualized Educational Video(s) Outcome: Progressing   Problem: Coping: Goal: Ability to adjust to condition or change in health will improve Outcome: Progressing   Problem: Fluid Volume: Goal: Ability to maintain a balanced intake and output will improve Outcome: Progressing   Problem: Health Behavior/Discharge Planning: Goal: Ability to identify and utilize available resources and services will improve Outcome: Progressing Goal: Ability to manage health-related needs will improve Outcome: Progressing   Problem: Metabolic: Goal: Ability to maintain appropriate glucose levels will improve Outcome: Progressing   Problem: Nutritional: Goal: Maintenance of adequate nutrition will improve Outcome: Progressing Goal: Progress toward achieving an optimal weight will improve Outcome: Progressing   Problem: Skin Integrity: Goal: Risk for impaired skin integrity will decrease Outcome: Progressing   Problem: Tissue Perfusion: Goal: Adequacy of tissue perfusion will improve Outcome: Progressing   Problem: Education: Goal: Knowledge of General Education information will improve Description: Including pain rating scale, medication(s)/side effects and non-pharmacologic comfort measures Outcome: Progressing   Problem: Health Behavior/Discharge Planning: Goal: Ability to manage health-related needs will improve Outcome: Progressing   Problem: Clinical Measurements: Goal:  Ability to maintain clinical measurements within normal limits will improve Outcome: Progressing Goal: Will remain free from infection Outcome: Progressing Goal: Diagnostic test results will improve Outcome: Progressing Goal: Respiratory complications will improve Outcome: Progressing Goal: Cardiovascular complication will be avoided Outcome: Progressing   Problem: Activity: Goal: Risk for activity intolerance will decrease Outcome: Progressing   Problem: Nutrition: Goal: Adequate nutrition will be maintained Outcome: Progressing   Problem: Coping: Goal: Level of anxiety will decrease Outcome: Progressing   Problem: Elimination: Goal: Will not experience complications related to bowel motility Outcome: Progressing Goal: Will not experience complications related to urinary retention Outcome: Progressing   Problem: Pain Managment: Goal: General experience of comfort will improve Outcome: Progressing   Problem: Safety: Goal: Ability to remain free from injury will improve Outcome: Progressing   Problem: Skin Integrity: Goal: Risk for impaired skin integrity will decrease Outcome: Progressing   

## 2022-10-27 NOTE — Procedures (Signed)
Patient was seen on dialysis and the procedure was supervised.  BFR 400  Via AVF BP is  126/55.   Patient appears to be tolerating treatment well  Cecille Aver 10/27/2022

## 2022-10-27 NOTE — Progress Notes (Signed)
Subjective: seen on HD smiling-  for discharge today after HD   Objective Vital signs in last 24 hours: Vitals:   10/27/22 0937 10/27/22 0956 10/27/22 1026 10/27/22 1056  BP:  136/60 (!) 144/58 (!) 126/55  Pulse:  88 87 77  Resp:  16 17 16   Temp:      TempSrc:      SpO2:  100% 100% 100%  Weight: 63.3 kg     Height:       Weight change:   Physical Exam: General: Alert adult female NAD Heart: RRR no MRG Lungs: CTA- nonlabored breathing Abdomen: NABS soft NTND no ascites Extremities: No pedal edema, left groin ecchymosis dialysis Access: LUA AV aVF +bruit   Home meds include - norvasc 2.5, nexium, synthroid, losartan 25-50mg  qd, metoprolol 50mg  xl qd, remeron, prednisone taper, renvela 800 ac tid, prns    OP HD : AF MWF 3.5h  400/1.5  62.2kg   2/2 bath  dry wt pend   AVF  Hep none - last HD 4/17, post wt 62.4kg - mircera 50 mcg IV  q2 wks, last 4/8, due 4/22   Problem/Plan: Bradycardia/ complete heart block/ hyperkalemia -resolved after^ K treated, attempts at external pacing were not successful. Seen by card.and improved w/ dopamine gtt and tx of hyperkalemia. No plans for pacemaker.  Hypotension - was briefly requiring vasopressor support in ICU, transferred to floor this weekend now resolved,  need to watch as OP likely orthostatic yesterday noted-this a.m. 139/65, no BP meds-   Hyperkalemia -pre hd dialysis yest.K4.0 admit ^ K+ improved after temp measures and 1st HD, Pt .Reported eating a lot of potatoes as an outpatient ESRD - on HD MWF. No missed OP HD. Had HD here 4/19 and 4/20.now HD on schedule HTN/volume -admit C XR= early edema HD x 2 =4 L UF total down to dry wt (? Bed wt accurate) UF 2 L 4/22 tolerated .yesterday HD to  her  dry weight Anemia -Hgb 8.6 <7.5 <7.8 has been low but stable ,Aranesp 200 past sun 4/21  give  next wk mon   Secondary hyperparathyroidism -phosphorus 4.9 <6.8 corrected calcium at goal, on Renvela  Back pain history of back surgery in the past=  /plan per admit Dispo-discharge today  Annie Sable, MD 10/27/2022      Labs: Basic Metabolic Panel: Recent Labs  Lab 10/23/22 1429 10/26/22 0332 10/27/22 0700  NA 137 136 136  K 4.0 3.8 4.1  CL 97* 95* 95*  CO2 25 27 24   GLUCOSE 89 125* 101*  BUN 43* 19 38*  CREATININE 6.65* 4.30* 6.95*  CALCIUM 8.1* 9.1 8.9  PHOS 6.8* 4.9* 6.5*   Liver Function Tests: Recent Labs  Lab 10/20/22 1737 10/23/22 1429  AST 43*  --   ALT 22  --   ALKPHOS 118  --   BILITOT 1.2  --   PROT 8.8*  --   ALBUMIN 3.6 2.6*   No results for input(s): "LIPASE", "AMYLASE" in the last 168 hours. No results for input(s): "AMMONIA" in the last 168 hours. CBC: Recent Labs  Lab 10/22/22 2146 10/23/22 0225 10/24/22 0247 10/25/22 0156 10/26/22 0332 10/27/22 0700  WBC 6.6 6.5 5.1 4.7 5.4 5.3  NEUTROABS 4.8  --   --   --   --   --   HGB 7.5* 7.5* 7.8* 7.5* 8.6* 8.7*  HCT 22.4* 22.5* 23.7* 22.6* 26.3* 26.1*  MCV 105.2* 105.1* 106.3* 106.1* 106.0* 105.7*  PLT 66* 69* 86* 73*  87* 84*   Cardiac Enzymes: No results for input(s): "CKTOTAL", "CKMB", "CKMBINDEX", "TROPONINI" in the last 168 hours. CBG: Recent Labs  Lab 10/21/22 1654 10/21/22 1954 10/21/22 2357 10/22/22 0348 10/22/22 0757  GLUCAP 114* 220* 123* 115* 137*    Studies/Results: No results found. Medications:  sodium chloride      amoxicillin-clavulanate  1 tablet Oral Q24H   [START ON 10/29/2022] darbepoetin (ARANESP) injection - DIALYSIS  200 mcg Subcutaneous Q Sun-1800   dextromethorphan-guaiFENesin  1 tablet Oral BID   melatonin  3 mg Oral QHS   pantoprazole  40 mg Oral Daily   sevelamer carbonate  800 mg Oral TID WC

## 2022-11-29 ENCOUNTER — Other Ambulatory Visit: Payer: Self-pay | Admitting: Family Medicine

## 2022-11-29 DIAGNOSIS — R55 Syncope and collapse: Secondary | ICD-10-CM

## 2022-11-30 ENCOUNTER — Ambulatory Visit
Admission: RE | Admit: 2022-11-30 | Discharge: 2022-11-30 | Disposition: A | Discharge status: Home / Self Care | Payer: 59 | Source: Ambulatory Visit | Attending: Family Medicine | Admitting: Family Medicine

## 2022-11-30 DIAGNOSIS — R55 Syncope and collapse: Secondary | ICD-10-CM

## 2022-12-11 NOTE — Progress Notes (Signed)
[facility] Telephone:([phone]) [phone]   [phone]) [phone]  INITIAL CONSULT NOTE  Patient Care Team: Lourena Simmonds, Donald Pore, MD as PCP - General (Family Medicine) Wyline Mood Alben Spittle, MD as PCP - Cardiology (Cardiology)   CHIEF COMPLAINTS/PURPOSE OF CONSULTATION:  Macroyctic anemia Thrombocytopenia  HISTORY OF PRESENTING ILLNESS:  Stacey Chambers 72 y.o. female with medical history significant for ESRD on dialysis and cirrhosis presents to the hematology clinic for evaluation of macrocytic anemia and thrombocytopenia. She is accompanied by a spanish interpretor for this visit.   On review of the previous records, there is evidence of anemia and thrombocytopenia starting 2022. Most recent labs from 10/27/2022 showed stable thrombocytopenia with plt of 84K but worsening macrocytic anemia with Hgb 8.7 and MCV 105.7.   On exam today, Stacey Chambers reports that she is overall feeling stable without any new or concerning symptoms. She does have some fatigue but is able to complete all her ADLs on her own. Her appetite and weight are unchanged. She reports hot flashes which requires her to change her clothes and sheets at night. She denies nausea, vomiting or abdominal pain. Her bowel habits are unchanged without recurrent episodes of diarrhea or constipation. She reports occasional episodes of nose bleeds but no other signs of overt bleeding. She has occasional dizziness but no syncopal episodes. She denies fevers, chills, sweats, shortness of breath, chest pain or cough. She has no other complaints. Rest of the ROS is below.   MEDICAL HISTORY:  Past Medical History:  Diagnosis Date   Anemia    Cirrhosis (HCC)    Diabetes mellitus without complication (HCC)    ESRD (end stage renal disease) on dialysis (HCC)    Hypertension    Type 2 diabetes mellitus (HCC)    Vision loss     SURGICAL HISTORY: Past Surgical History:  Procedure Laterality Date   AV FISTULA PLACEMENT Left  12/23/2021   Procedure: INSERTION OF LEFT ARM BRACHIOCEPHALIC ARTERIOVENOUS FISTULA;  Surgeon: Cephus Shelling, MD;  Location: [facility] OR;  Service: Vascular;  Laterality: Left;   BACK SURGERY     COLONOSCOPY     IR FLUORO GUIDE CV LINE RIGHT  12/16/2021   IR US GUIDE VASC ACCESS RIGHT  12/16/2021   UPPER GASTROINTESTINAL ENDOSCOPY      SOCIAL HISTORY: Social History   Socioeconomic History   Marital status: Married    Spouse name: Not on file   Number of children: Not on file   Years of education: Not on file   Highest education level: Not on file  Occupational History   Not on file  Tobacco Use   Smoking status: Never   Smokeless tobacco: Never  Vaping Use   Vaping Use: Never used  Substance and Sexual Activity   Alcohol use: Never    Comment: once in awhile   Drug use: Not Currently    Types: Marijuana    Comment: Use of THC tea 1-2 times per week   Sexual activity: Not on file  Other Topics Concern   Not on file  Social History Narrative   Not on file   Social Determinants of Health   Financial Resource Strain: Not on file  Food Insecurity: No Food Insecurity (10/26/2022)   Hunger Vital Sign    Worried About Running Out of Food in the Last Year: Never true    Ran Out of Food in the Last Year: Never true  Transportation Needs: No Transportation Needs (10/26/2022)   PRAPARE - Transportation  Lack of Transportation (Medical): No    Lack of Transportation (Non-Medical): No  Physical Activity: Not on file  Stress: Not on file  Social Connections: Not on file  Intimate Partner Violence: Not At Risk (10/26/2022)   Humiliation, Afraid, Rape, and Kick questionnaire    Fear of Current or Ex-Partner: No    Emotionally Abused: No    Physically Abused: No    Sexually Abused: No    FAMILY HISTORY: Family History  Problem Relation Age of Onset   Diabetes Brother    Colon cancer Neg Hx    Esophageal cancer Neg Hx    Stomach cancer Neg Hx    Pancreatic cancer Neg Hx     Inflammatory bowel disease Neg Hx    Liver disease Neg Hx    Rectal cancer Neg Hx    Colon polyps Neg Hx     ALLERGIES:  has No Known Allergies.  MEDICATIONS:  Current Outpatient Medications  Medication Sig Dispense Refill   acetaminophen (TYLENOL) 500 MG tablet Take 500 mg by mouth every 8 (eight) hours as needed for moderate pain.     albuterol (VENTOLIN HFA) 108 (90 Base) MCG/ACT inhaler Inhale into the lungs.     guaiFENesin (ROBITUSSIN) 100 MG/5ML liquid Take 5 mLs by mouth every 4 (four) hours as needed for cough or to loosen phlegm. 120 mL 0   losartan (COZAAR) 100 MG tablet Take 100 mg by mouth daily.     mirtazapine (REMERON) 15 MG tablet Take 15 mg by mouth at bedtime.     sevelamer carbonate (RENVELA) 800 MG tablet Take 1 tablet (800 mg total) by mouth 3 (three) times daily with meals. 90 tablet 1   hydrALAZINE (APRESOLINE) 50 MG tablet Take 1 tablet (50 mg total) by mouth 3 (three) times daily. 270 tablet 3   No current facility-administered medications for this visit.    REVIEW OF SYSTEMS:   Constitutional: ( - ) fevers, ( - )  chills , ( - ) night sweats Eyes: ( - ) blurriness of vision, ( - ) double vision, ( - ) watery eyes Ears, nose, mouth, throat, and face: ( - ) mucositis, ( - ) sore throat Respiratory: ( - ) cough, ( - ) dyspnea, ( - ) wheezes Cardiovascular: ( - ) palpitation, ( - ) chest discomfort, ( - ) lower extremity swelling Gastrointestinal:  ( - ) nausea, ( - ) heartburn, ( - ) change in bowel habits Skin: ( - ) abnormal skin rashes Lymphatics: ( - ) new lymphadenopathy, ( - ) easy bruising Neurological: ( - ) numbness, ( - ) tingling, ( - ) new weaknesses Behavioral/Psych: ( - ) mood change, ( - ) new changes  All other systems were reviewed with the patient and are negative.  PHYSICAL EXAMINATION: ECOG PERFORMANCE STATUS: 1 - Symptomatic but completely ambulatory  Vitals:   12/12/22 0903  BP: (!) 190/87  Pulse: 93  Resp: 17  Temp: (!) 97.3  F (36.3 C)  SpO2: 98%   Filed Weights   12/12/22 0903  Weight: 142 lb 8 oz (64.6 kg)    GENERAL: well appearing female in NAD  SKIN: skin color, texture, turgor are normal, no rashes or significant lesions EYES: conjunctiva are pink and non-injected, sclera clear LUNGS: clear to auscultation and percussion with normal breathing effort HEART: regular rate & rhythm and no murmurs and no lower extremity edema ABDOMEN: soft, non-tender, non-distended, normal bowel sounds Musculoskeletal: no cyanosis of digits and  no clubbing  PSYCH: alert & oriented x 3, fluent speech NEURO: no focal motor/sensory deficits  LABORATORY DATA:  I have reviewed the data as listed    Latest Ref Rng & Units 12/12/2022    9:57 AM 10/27/2022    7:00 AM 10/26/2022    3:32 AM  CBC  WBC 4.0 - 10.5 K/uL 4.7  5.3  5.4   Hemoglobin 12.0 - 15.0 g/dL 65.7  8.7  8.6   Hematocrit 36.0 - 46.0 % 30.9  26.1  26.3   Platelets 150 - 400 K/uL 83  84  87        Latest Ref Rng & Units 12/12/2022    9:57 AM 10/27/2022    7:00 AM 10/26/2022    3:32 AM  CMP  Glucose 70 - 99 mg/dL 846  962  952   BUN 8 - 23 mg/dL 25  38  19   Creatinine 0.44 - 1.00 mg/dL 8.41  3.24  4.01   Sodium 135 - 145 mmol/L 130  136  136   Potassium 3.5 - 5.1 mmol/L 4.5  4.1  3.8   Chloride 98 - 111 mmol/L 92  95  95   CO2 22 - 32 mmol/L 28  24  27    Calcium 8.9 - 10.3 mg/dL 8.9  8.9  9.1   Total Protein 6.5 - 8.1 g/dL 8.3     Total Bilirubin 0.3 - 1.2 mg/dL 1.2     Alkaline Phos 38 - 126 U/L 100     AST 15 - 41 U/L 24     ALT 0 - 44 U/L 11      RADIOGRAPHIC STUDIES: I have personally reviewed the radiological images as listed and agreed with the findings in the report. CT HEAD WO CONTRAST ( )  Result Date: 11/30/2022 CLINICAL DATA:  Provided history: Syncope, unspecified syncope type. Falls. Head trauma. EXAM: CT HEAD WITHOUT CONTRAST TECHNIQUE: Contiguous axial images were obtained from the base of the skull through the vertex without  intravenous contrast. RADIATION DOSE REDUCTION: This exam was performed according to the departmental dose-optimization program which includes automated exposure control, adjustment of the mA and/or kV according to patient size and/or use of iterative reconstruction technique. COMPARISON:  Head CT 10/20/2022. FINDINGS: Brain: Mild cerebral atrophy. Patchy and ill-defined hypoattenuation within the cerebral white matter, nonspecific but compatible with minimal chronic small vessel ischemic disease. There is no acute intracranial hemorrhage. No demarcated cortical infarct. No extra-axial fluid collection. No evidence of an intracranial mass. No midline shift. Vascular: No hyperdense vessel.  Atherosclerotic calcifications. Skull: No fracture or aggressive osseous lesion. Sinuses/Orbits: No mass or acute finding within the imaged orbits. Minimal mucosal thickening within the right maxillary sinus. Other: Posterior scalp hematoma at midline and to the right. IMPRESSION: 1.  No evidence of an acute intracranial abnormality. 2. Posterior scalp hematoma. 3. Minimal chronic small-vessel ischemic changes within the cerebral white matter. 4. Mild cerebral atrophy. Electronically Signed   By: Jackey Loge D.O.   On: 11/30/2022 10:18    ASSESSMENT & PLAN Stacey Chambers is a 72 y.o. female who presents to the clinic for evaluation of macrocytic anemia and thrombocytopenia. Patient's underlying ESRD and cirrhosis/splenomegaly are what is most likely causing her anemia and thrombocytopenia. We will evaluate for other causes including nutritional deficiencies, copper deficiency, hemolysis and paraproteinemia.Patient expressed understanding and will proceed with workup as planned.   #Macrocytic anemia #Thrombocytopenia: --Most likely secondary to ESRD and cirrhosis with splenomegaly --Labs today  to evaluate for other causes. Will check CBC, CMP, vitamin B12, folate, MMA, copper, iron panel, haptoglobin, LDH, SPEP/IFE  and sFLC.  --Strict precautions for bleeding.  --RTC based on above workup.   Orders Placed This Encounter  Procedures   CBC with Differential (Cancer Center Only)    Standing Status:   Future    Number of Occurrences:   1    Standing Expiration Date:   12/12/2023   CMP (Cancer Center only)    Standing Status:   Future    Number of Occurrences:   1    Standing Expiration Date:   12/12/2023   Ferritin    Standing Status:   Future    Number of Occurrences:   1    Standing Expiration Date:   12/12/2023   Iron and Iron Binding Capacity ([facility] only)    Standing Status:   Future    Number of Occurrences:   1    Standing Expiration Date:   12/12/2023   Retic Panel    Standing Status:   Future    Number of Occurrences:   1    Standing Expiration Date:   12/12/2023   Haptoglobin    Standing Status:   Future    Number of Occurrences:   1    Standing Expiration Date:   12/12/2023   Lactate dehydrogenase (LDH)    Standing Status:   Future    Number of Occurrences:   1    Standing Expiration Date:   12/12/2023   Methylmalonic acid, serum    Standing Status:   Future    Number of Occurrences:   1    Standing Expiration Date:   12/12/2023   Vitamin B12    Standing Status:   Future    Number of Occurrences:   1    Standing Expiration Date:   12/12/2023   Folate, Serum    Standing Status:   Future    Number of Occurrences:   1    Standing Expiration Date:   12/12/2023   Copper, serum    Standing Status:   Future    Number of Occurrences:   1    Standing Expiration Date:   12/12/2023   Multiple Myeloma Panel (SPEP&IFE w/QIG)    Standing Status:   Future    Number of Occurrences:   1    Standing Expiration Date:   12/12/2023   Kappa/lambda light chains    Standing Status:   Future    Number of Occurrences:   1    Standing Expiration Date:   12/12/2023    All questions were answered. The patient knows to call the clinic with any problems, questions or concerns.  I have spent a  total of 60 minutes minutes of face-to-face and non-face-to-face time, preparing to see the patient, obtaining and/or reviewing separately obtained history, performing a medically appropriate examination, counseling and educating the patient, ordering tests/procedures,documenting clinical information in the electronic health record, and care coordination.   Georga Kaufmann, PA-C Department of Hematology/Oncology [facility] at [facility] Phone: [phone]  Patient was seen with Dr. Leonides Schanz.   I have read the above note and personally examined the patient. I agree with the assessment and plan as noted above.  Briefly Stacey Chambers is a 72 year old female who presents for evaluation of macrocytosis and thrombocytopenia.  At this time the etiology of her findings is unclear.  We will do a full nutritional panel to  include vitamin B12, folate, and methylmalonic acid.  Additionally we will evaluate copper levels.  Will assess for hemolysis as well as monoclonal gammopathy.  At this time I do suspect her findings are most likely secondary to her liver disease and ESRD.  If are workup above did not show a clear hematological abnormality would recommend periodic monitoring of her blood counts with consideration of a bone marrow biopsy if her blood counts were to unexpectedly worsen or fail to respond to erythropoietin therapy.   Ulysees Barns, MD Department of Hematology/Oncology [facility] at [facility] Phone: [phone] Pager: [phone] Email: [email or web address]

## 2022-12-12 ENCOUNTER — Ambulatory Visit: Payer: 59 | Admitting: Vascular Surgery

## 2022-12-12 ENCOUNTER — Other Ambulatory Visit: Payer: Self-pay

## 2022-12-12 ENCOUNTER — Inpatient Hospital Stay: Payer: 59 | Attending: Internal Medicine

## 2022-12-12 ENCOUNTER — Inpatient Hospital Stay (HOSPITAL_BASED_OUTPATIENT_CLINIC_OR_DEPARTMENT_OTHER): Payer: 59 | Admitting: Physician Assistant

## 2022-12-12 ENCOUNTER — Encounter: Payer: Self-pay | Admitting: Physician Assistant

## 2022-12-12 VITALS — BP 190/87 | HR 93 | Temp 97.3°F | Resp 17 | Wt 142.5 lb

## 2022-12-12 DIAGNOSIS — R161 Splenomegaly, not elsewhere classified: Secondary | ICD-10-CM

## 2022-12-12 DIAGNOSIS — D696 Thrombocytopenia, unspecified: Secondary | ICD-10-CM | POA: Diagnosis not present

## 2022-12-12 DIAGNOSIS — K7460 Unspecified cirrhosis of liver: Secondary | ICD-10-CM | POA: Diagnosis not present

## 2022-12-12 DIAGNOSIS — N186 End stage renal disease: Secondary | ICD-10-CM | POA: Diagnosis not present

## 2022-12-12 DIAGNOSIS — D539 Nutritional anemia, unspecified: Secondary | ICD-10-CM | POA: Diagnosis present

## 2022-12-12 LAB — CMP (CANCER CENTER ONLY)
ALT: 11 U/L (ref 0–44)
AST: 24 U/L (ref 15–41)
Albumin: 4.1 g/dL (ref 3.5–5.0)
Alkaline Phosphatase: 100 U/L (ref 38–126)
Anion gap: 10 (ref 5–15)
BUN: 25 mg/dL — ABNORMAL HIGH (ref 8–23)
CO2: 28 mmol/L (ref 22–32)
Calcium: 8.9 mg/dL (ref 8.9–10.3)
Chloride: 92 mmol/L — ABNORMAL LOW (ref 98–111)
Creatinine: 4.04 mg/dL — ABNORMAL HIGH (ref 0.44–1.00)
GFR, Estimated: 11 mL/min — ABNORMAL LOW (ref >60)
Glucose, Bld: 102 mg/dL — ABNORMAL HIGH (ref 70–99)
Potassium: 4.5 mmol/L (ref 3.5–5.1)
Sodium: 130 mmol/L — ABNORMAL LOW (ref 135–145)
Total Bilirubin: 1.2 mg/dL (ref 0.3–1.2)
Total Protein: 8.3 g/dL — ABNORMAL HIGH (ref 6.5–8.1)

## 2022-12-12 LAB — IRON AND IRON BINDING CAPACITY (CC-WL,HP ONLY)
Iron: 105 ug/dL (ref 28–170)
Saturation Ratios: 41 % — ABNORMAL HIGH (ref 10.4–31.8)
TIBC: 259 ug/dL (ref 250–450)
UIBC: 154 ug/dL (ref 148–442)

## 2022-12-12 LAB — RETIC PANEL
Immature Retic Fract: 14 % (ref 2.3–15.9)
RBC.: 3.1 MIL/uL — ABNORMAL LOW (ref 3.87–5.11)
Retic Count, Absolute: 60.8 10*3/uL (ref 19.0–186.0)
Retic Ct Pct: 2 % (ref 0.4–3.1)
Reticulocyte Hemoglobin: 39.4 pg (ref >27.9)

## 2022-12-12 LAB — FERRITIN: Ferritin: 563 ng/mL — ABNORMAL HIGH (ref 11–307)

## 2022-12-12 LAB — CBC WITH DIFFERENTIAL (CANCER CENTER ONLY)
Abs Immature Granulocytes: 0.02 10*3/uL (ref 0.00–0.07)
Basophils Absolute: 0 10*3/uL (ref 0.0–0.1)
Basophils Relative: 1 %
Eosinophils Absolute: 0.1 10*3/uL (ref 0.0–0.5)
Eosinophils Relative: 1 %
HCT: 30.9 % — ABNORMAL LOW (ref 36.0–46.0)
Hemoglobin: 11.1 g/dL — ABNORMAL LOW (ref 12.0–15.0)
Immature Granulocytes: 0 %
Lymphocytes Relative: 14 %
Lymphs Abs: 0.6 10*3/uL — ABNORMAL LOW (ref 0.7–4.0)
MCH: 36.3 pg — ABNORMAL HIGH (ref 26.0–34.0)
MCHC: 35.9 g/dL (ref 30.0–36.0)
MCV: 101 fL — ABNORMAL HIGH (ref 80.0–100.0)
Monocytes Absolute: 0.6 10*3/uL (ref 0.1–1.0)
Monocytes Relative: 12 %
Neutro Abs: 3.4 10*3/uL (ref 1.7–7.7)
Neutrophils Relative %: 72 %
Platelet Count: 83 10*3/uL — ABNORMAL LOW (ref 150–400)
RBC: 3.06 MIL/uL — ABNORMAL LOW (ref 3.87–5.11)
RDW: 14 % (ref 11.5–15.5)
Smear Review: NORMAL
WBC Count: 4.7 10*3/uL (ref 4.0–10.5)
nRBC: 0 % (ref 0.0–0.2)

## 2022-12-12 LAB — VITAMIN B12: Vitamin B-12: 724 pg/mL (ref 180–914)

## 2022-12-12 LAB — FOLATE: Folate: 14.4 ng/mL (ref >5.9)

## 2022-12-12 LAB — LACTATE DEHYDROGENASE: LDH: 147 U/L (ref 98–192)

## 2022-12-13 LAB — HAPTOGLOBIN: Haptoglobin: 67 mg/dL (ref 37–355)

## 2022-12-14 LAB — KAPPA/LAMBDA LIGHT CHAINS
Kappa free light chain: 236.2 mg/L — ABNORMAL HIGH (ref 3.3–19.4)
Kappa, lambda light chain ratio: 1.31 (ref 0.26–1.65)
Lambda free light chains: 180.8 mg/L — ABNORMAL HIGH (ref 5.7–26.3)

## 2022-12-14 LAB — COPPER, SERUM: Copper: 121 ug/dL (ref 80–158)

## 2022-12-17 LAB — METHYLMALONIC ACID, SERUM: Methylmalonic Acid, Quantitative: 494 nmol/L — ABNORMAL HIGH (ref 0–378)

## 2022-12-18 LAB — MULTIPLE MYELOMA PANEL, SERUM
Albumin SerPl Elph-Mcnc: 3.9 g/dL (ref 2.9–4.4)
Albumin/Glob SerPl: 1.1 (ref 0.7–1.7)
Alpha 1: 0.3 g/dL (ref 0.0–0.4)
Alpha2 Glob SerPl Elph-Mcnc: 0.6 g/dL (ref 0.4–1.0)
B-Globulin SerPl Elph-Mcnc: 0.9 g/dL (ref 0.7–1.3)
Gamma Glob SerPl Elph-Mcnc: 2 g/dL — ABNORMAL HIGH (ref 0.4–1.8)
Globulin, Total: 3.8 g/dL (ref 2.2–3.9)
IgA: 394 mg/dL — ABNORMAL HIGH (ref 87–352)
IgG (Immunoglobin G), Serum: 2172 mg/dL — ABNORMAL HIGH (ref 586–1602)
IgM (Immunoglobulin M), Srm: 112 mg/dL (ref 26–217)
Total Protein ELP: 7.7 g/dL (ref 6.0–8.5)

## 2022-12-18 NOTE — Progress Notes (Unsigned)
Cardiology Office Note:    Date:  12/18/2022   ID:  Stacey Chambers May 15, 1951, MRN [id number]  PCP:  Stacey Quint, MD   [facility] Providers Cardiologist:  None { Click to update primary MD,subspecialty MD or APP then REFRESH:1}    Referring MD: Stacey Chambers, Stacey Chambers, *   No chief complaint on file. ***  History of Present Illness:    Stacey Chambers is a 72 y.o. female with a hx of  ESRD, Cirrhosis, referral from Dr. Alvy Chambers for HFpeF. No known coronary dx or arrhythmia.   Recent negative MM panel  Past Medical History:  Diagnosis Date   Anemia    Cirrhosis (HCC)    Diabetes mellitus without complication (HCC)    ESRD (end stage renal disease) on dialysis (HCC)    Hypertension    Type 2 diabetes mellitus (HCC)    Vision loss     Past Surgical History:  Procedure Laterality Date   AV FISTULA PLACEMENT Left 12/23/2021   Procedure: INSERTION OF LEFT ARM BRACHIOCEPHALIC ARTERIOVENOUS FISTULA;  Surgeon: Stacey Shelling, MD;  Location: [facility] OR;  Service: Vascular;  Laterality: Left;   BACK SURGERY     COLONOSCOPY     IR FLUORO GUIDE CV LINE RIGHT  12/16/2021   IR US GUIDE VASC ACCESS RIGHT  12/16/2021   UPPER GASTROINTESTINAL ENDOSCOPY      Current Medications: No outpatient medications have been marked as taking for the 12/19/22 encounter (Appointment) with Stacey Fus, MD.     Allergies:   Patient has no known allergies.   Social History   Socioeconomic History   Marital status: Married    Spouse name: Not on file   Number of children: Not on file   Years of education: Not on file   Highest education level: Not on file  Occupational History   Not on file  Tobacco Use   Smoking status: Never   Smokeless tobacco: Never  Vaping Use   Vaping Use: Never used  Substance and Sexual Activity   Alcohol use: Never    Comment: once in awhile   Drug use: Not Currently    Types: Marijuana    Comment: Use of THC tea 1-2  times per week   Sexual activity: Not on file  Other Topics Concern   Not on file  Social History Narrative   Not on file   Social Determinants of Health   Financial Resource Strain: Not on file  Food Insecurity: No Food Insecurity (10/26/2022)   Hunger Vital Sign    Worried About Running Out of Food in the Last Year: Never true    Ran Out of Food in the Last Year: Never true  Transportation Needs: No Transportation Needs (10/26/2022)   PRAPARE - Administrator, Civil Service (Medical): No    Lack of Transportation (Non-Medical): No  Physical Activity: Not on file  Stress: Not on file  Social Connections: Not on file     Family History: The patient's ***family history includes Diabetes in her brother. There is no history of Colon cancer, Esophageal cancer, Stomach cancer, Pancreatic cancer, Inflammatory bowel disease, Liver disease, Rectal cancer, or Colon polyps.  ROS:   Please see the history of present illness.    *** All other systems reviewed and are negative.  EKGs/Labs/Other Studies Reviewed:    The following studies were reviewed today:   TTE: EF 60-65%, RV fxn is normal, mild PHTN 41 mmHg, elevated  EDP, small pericardial effusion,   EKG:  EKG is *** ordered today.  The ekg ordered today demonstrates ***  Recent Labs: 07/07/2022: TSH 4.07 09/15/2022: B Natriuretic Peptide 1,140.0 10/27/2022: Magnesium 2.5 12/12/2022: ALT 11; BUN 25; Creatinine 4.04; Hemoglobin 11.1; Platelet Count 83; Potassium 4.5; Sodium 130  Recent Lipid Panel    Component Value Date/Time   CHOL 144 03/30/2022 1327   TRIG 137 10/27/2022 0700   HDL 61.60 03/30/2022 1327   CHOLHDL 2 03/30/2022 1327   VLDL 21.2 03/30/2022 1327   LDLCALC 62 03/30/2022 1327     Risk Assessment/Calculations:   {Does this patient have ATRIAL FIBRILLATION?[phone]}  No BP recorded.  {Refresh Note OR Click here to enter BP  :1}***         Physical Exam:    VS:  There were no vitals taken for  this visit.    Wt Readings from Last 3 Encounters:  12/12/22 142 lb 8 oz (64.6 kg)  10/27/22 136 lb 0.4 oz (61.7 kg)  09/21/22 138 lb 10.7 oz (62.9 kg)     GEN: *** Well nourished, well developed in no acute distress HEENT: Normal NECK: No JVD; No carotid bruits LYMPHATICS: No lymphadenopathy CARDIAC: ***RRR, no murmurs, rubs, gallops RESPIRATORY:  Clear to auscultation without rales, wheezing or rhonchi  ABDOMEN: Soft, non-tender, non-distended MUSCULOSKELETAL:  No edema; No deformity  SKIN: Warm and dry NEUROLOGIC:  Alert and oriented x 3 PSYCHIATRIC:  Normal affect   ASSESSMENT:    HFpEF/ESRD: UF per IHD HTN: per nephrology DM2?: A1c < 5.7, not on medications PLAN:    In order of problems listed above:  ***      {Are you ordering a CV Procedure (e.g. stress test, cath, DCCV, TEE, etc)?   Press F2        :[id number]}    Medication Adjustments/Labs and Tests Ordered: Current medicines are reviewed at length with the patient today.  Concerns regarding medicines are outlined above.  No orders of the defined types were placed in this encounter.  No orders of the defined types were placed in this encounter.   There are no Patient Instructions on file for this visit.   Signed, Stacey Fus, MD  12/18/2022 10:58 AM    [facility]

## 2022-12-19 ENCOUNTER — Encounter: Payer: Self-pay | Admitting: Internal Medicine

## 2022-12-19 ENCOUNTER — Ambulatory Visit: Payer: 59 | Attending: Internal Medicine | Admitting: Internal Medicine

## 2022-12-19 VITALS — BP 158/96 | HR 83 | Ht 61.0 in | Wt 139.4 lb

## 2022-12-19 DIAGNOSIS — I5031 Acute diastolic (congestive) heart failure: Secondary | ICD-10-CM | POA: Diagnosis not present

## 2022-12-19 MED ORDER — HYDRALAZINE HCL 50 MG PO TABS: 50.0000 mg | ORAL_TABLET | Freq: Three times a day (TID) | ORAL | 3 refills | Status: DC

## 2022-12-19 NOTE — Patient Instructions (Addendum)
Medication Instructions:  START HYDRALAZINE 50 MG THREE TIMES DAILY.  *If you need a refill on your cardiac medications before your next appointment, please call your pharmacy*   Lab Work: NONE  If you have labs (blood work) drawn today and your tests are completely normal, you will receive your results only by: MyChart Message (if you have MyChart) OR A paper copy in the mail If you have any lab test that is abnormal or we need to change your treatment, we will call you to review the results.   Testing/Procedures: NONE   Follow-Up: At [facility], you and your health needs are our priority.  As part of our continuing mission to provide you with exceptional heart care, we have created designated Provider Care Teams.  These Care Teams include your primary Cardiologist (physician) and Advanced Practice Providers (APPs -  Physician Assistants and Nurse Practitioners) who all work together to provide you with the care you need, when you need it.  We recommend signing up for the patient portal called "MyChart".  Sign up information is provided on this After Visit Summary.  MyChart is used to connect with patients for Virtual Visits (Telemedicine).  Patients are able to view lab/test results, encounter notes, upcoming appointments, etc.  Non-urgent messages can be sent to your provider as well.   To learn more about what you can do with MyChart, go to [email or web address].    Your next appointment:   6 month(s)  Provider:   Maisie Fus, MD

## 2022-12-20 ENCOUNTER — Telehealth: Payer: Self-pay | Admitting: Physician Assistant

## 2022-12-20 NOTE — Telephone Encounter (Signed)
I notified Stacey Chambers by phone via Interpreter line regarding her lab results from visit visit on 12/12/22 with Karena Addison PA and Dr. Leonides Schanz. The abnormalities seen in labs are consistent with her known ESRD and cirrhosis. MM panel was negative. Results were discussed with Dr. Leonides Schanz as well and recommendation is for patient to RTC for labs and MD visit in 6 months. All of patient's questions were answered and she expressed understanding of the plan provided.

## 2022-12-25 ENCOUNTER — Encounter: Payer: Self-pay | Admitting: Internal Medicine

## 2023-01-13 ENCOUNTER — Other Ambulatory Visit: Payer: Self-pay

## 2023-01-13 ENCOUNTER — Inpatient Hospital Stay (HOSPITAL_COMMUNITY): Payer: 59

## 2023-01-13 ENCOUNTER — Emergency Department (HOSPITAL_COMMUNITY): Payer: 59

## 2023-01-13 ENCOUNTER — Inpatient Hospital Stay (HOSPITAL_COMMUNITY)
Admission: EM | Admit: 2023-01-13 | Discharge: 2023-01-17 | DRG: 228 | Disposition: A | Discharge status: Home Health | Payer: 59 | Attending: Internal Medicine | Admitting: Internal Medicine

## 2023-01-13 ENCOUNTER — Encounter (HOSPITAL_COMMUNITY): Payer: Self-pay | Admitting: Cardiology

## 2023-01-13 ENCOUNTER — Encounter (HOSPITAL_COMMUNITY)
Admission: EM | Disposition: A | Discharge status: Home Health | Payer: Self-pay | Source: Home / Self Care | Attending: Internal Medicine

## 2023-01-13 DIAGNOSIS — Z833 Family history of diabetes mellitus: Secondary | ICD-10-CM

## 2023-01-13 DIAGNOSIS — G8929 Other chronic pain: Secondary | ICD-10-CM | POA: Diagnosis present

## 2023-01-13 DIAGNOSIS — M549 Dorsalgia, unspecified: Secondary | ICD-10-CM | POA: Diagnosis present

## 2023-01-13 DIAGNOSIS — E871 Hypo-osmolality and hyponatremia: Secondary | ICD-10-CM | POA: Diagnosis present

## 2023-01-13 DIAGNOSIS — I132 Hypertensive heart and chronic kidney disease with heart failure and with stage 5 chronic kidney disease, or end stage renal disease: Secondary | ICD-10-CM | POA: Diagnosis not present

## 2023-01-13 DIAGNOSIS — I5032 Chronic diastolic (congestive) heart failure: Secondary | ICD-10-CM | POA: Diagnosis present

## 2023-01-13 DIAGNOSIS — K7030 Alcoholic cirrhosis of liver without ascites: Secondary | ICD-10-CM | POA: Diagnosis present

## 2023-01-13 DIAGNOSIS — Z006 Encounter for examination for normal comparison and control in clinical research program: Secondary | ICD-10-CM | POA: Diagnosis not present

## 2023-01-13 DIAGNOSIS — Z992 Dependence on renal dialysis: Secondary | ICD-10-CM | POA: Diagnosis not present

## 2023-01-13 DIAGNOSIS — Z79899 Other long term (current) drug therapy: Secondary | ICD-10-CM

## 2023-01-13 DIAGNOSIS — M898X9 Other specified disorders of bone, unspecified site: Secondary | ICD-10-CM | POA: Diagnosis present

## 2023-01-13 DIAGNOSIS — I442 Atrioventricular block, complete: Secondary | ICD-10-CM | POA: Diagnosis not present

## 2023-01-13 DIAGNOSIS — R0600 Dyspnea, unspecified: Secondary | ICD-10-CM

## 2023-01-13 DIAGNOSIS — R570 Cardiogenic shock: Secondary | ICD-10-CM | POA: Diagnosis present

## 2023-01-13 DIAGNOSIS — I5021 Acute systolic (congestive) heart failure: Secondary | ICD-10-CM | POA: Diagnosis not present

## 2023-01-13 DIAGNOSIS — E1122 Type 2 diabetes mellitus with diabetic chronic kidney disease: Secondary | ICD-10-CM | POA: Diagnosis present

## 2023-01-13 DIAGNOSIS — R55 Syncope and collapse: Secondary | ICD-10-CM | POA: Diagnosis not present

## 2023-01-13 DIAGNOSIS — E875 Hyperkalemia: Secondary | ICD-10-CM | POA: Diagnosis present

## 2023-01-13 DIAGNOSIS — I10 Essential (primary) hypertension: Secondary | ICD-10-CM

## 2023-01-13 DIAGNOSIS — D631 Anemia in chronic kidney disease: Secondary | ICD-10-CM | POA: Diagnosis present

## 2023-01-13 DIAGNOSIS — N186 End stage renal disease: Secondary | ICD-10-CM | POA: Diagnosis not present

## 2023-01-13 DIAGNOSIS — D6959 Other secondary thrombocytopenia: Secondary | ICD-10-CM | POA: Diagnosis present

## 2023-01-13 HISTORY — PX: TEMPORARY PACEMAKER: CATH118268

## 2023-01-13 LAB — PROTIME-INR
INR: 1.4 — ABNORMAL HIGH (ref 0.8–1.2)
Prothrombin Time: 17.6 seconds — ABNORMAL HIGH (ref 11.4–15.2)

## 2023-01-13 LAB — COMPREHENSIVE METABOLIC PANEL
ALT: 19 U/L (ref 0–44)
AST: 33 U/L (ref 15–41)
Albumin: 3.3 g/dL — ABNORMAL LOW (ref 3.5–5.0)
Alkaline Phosphatase: 116 U/L (ref 38–126)
Anion gap: 16 — ABNORMAL HIGH (ref 5–15)
BUN: 26 mg/dL — ABNORMAL HIGH (ref 8–23)
CO2: 22 mmol/L (ref 22–32)
Calcium: 10.3 mg/dL (ref 8.9–10.3)
Chloride: 89 mmol/L — ABNORMAL LOW (ref 98–111)
Creatinine, Ser: 3.93 mg/dL — ABNORMAL HIGH (ref 0.44–1.00)
GFR, Estimated: 12 mL/min — ABNORMAL LOW (ref >60)
Glucose, Bld: 119 mg/dL — ABNORMAL HIGH (ref 70–99)
Potassium: 5 mmol/L (ref 3.5–5.1)
Sodium: 127 mmol/L — ABNORMAL LOW (ref 135–145)
Total Bilirubin: 1 mg/dL (ref 0.3–1.2)
Total Protein: 7.9 g/dL (ref 6.5–8.1)

## 2023-01-13 LAB — I-STAT VENOUS BLOOD GAS, ED
Acid-Base Excess: 3 mmol/L — ABNORMAL HIGH (ref 0.0–2.0)
Bicarbonate: 25.6 mmol/L (ref 20.0–28.0)
Calcium, Ion: 1.16 mmol/L (ref 1.15–1.40)
HCT: 35 % — ABNORMAL LOW (ref 36.0–46.0)
Hemoglobin: 11.9 g/dL — ABNORMAL LOW (ref 12.0–15.0)
O2 Saturation: 90 %
Potassium: 5.1 mmol/L (ref 3.5–5.1)
Sodium: 127 mmol/L — ABNORMAL LOW (ref 135–145)
TCO2: 27 mmol/L (ref 22–32)
pCO2, Ven: 32.7 mmHg — ABNORMAL LOW (ref 44–60)
pH, Ven: 7.502 — ABNORMAL HIGH (ref 7.25–7.43)
pO2, Ven: 52 mmHg — ABNORMAL HIGH (ref 32–45)

## 2023-01-13 LAB — I-STAT CHEM 8, ED
BUN: 26 mg/dL — ABNORMAL HIGH (ref 8–23)
Calcium, Ion: 1.17 mmol/L (ref 1.15–1.40)
Chloride: 92 mmol/L — ABNORMAL LOW (ref 98–111)
Creatinine, Ser: 4.1 mg/dL — ABNORMAL HIGH (ref 0.44–1.00)
Glucose, Bld: 118 mg/dL — ABNORMAL HIGH (ref 70–99)
HCT: 37 % (ref 36.0–46.0)
Hemoglobin: 12.6 g/dL (ref 12.0–15.0)
Potassium: 5 mmol/L (ref 3.5–5.1)
Sodium: 127 mmol/L — ABNORMAL LOW (ref 135–145)
TCO2: 24 mmol/L (ref 22–32)

## 2023-01-13 LAB — BASIC METABOLIC PANEL
Anion gap: 17 — ABNORMAL HIGH (ref 5–15)
BUN: 33 mg/dL — ABNORMAL HIGH (ref 8–23)
CO2: 21 mmol/L — ABNORMAL LOW (ref 22–32)
Calcium: 9.2 mg/dL (ref 8.9–10.3)
Chloride: 90 mmol/L — ABNORMAL LOW (ref 98–111)
Creatinine, Ser: 4.6 mg/dL — ABNORMAL HIGH (ref 0.44–1.00)
GFR, Estimated: 10 mL/min — ABNORMAL LOW (ref >60)
Glucose, Bld: 87 mg/dL (ref 70–99)
Potassium: 6.1 mmol/L — ABNORMAL HIGH (ref 3.5–5.1)
Sodium: 128 mmol/L — ABNORMAL LOW (ref 135–145)

## 2023-01-13 LAB — TROPONIN I (HIGH SENSITIVITY)
Troponin I (High Sensitivity): 15 ng/L (ref <18)
Troponin I (High Sensitivity): 17 ng/L (ref <18)

## 2023-01-13 LAB — MRSA NEXT GEN BY PCR, NASAL: MRSA by PCR Next Gen: NOT DETECTED

## 2023-01-13 LAB — CBC
HCT: 35.8 % — ABNORMAL LOW (ref 36.0–46.0)
Hemoglobin: 11.9 g/dL — ABNORMAL LOW (ref 12.0–15.0)
MCH: 34.2 pg — ABNORMAL HIGH (ref 26.0–34.0)
MCHC: 33.2 g/dL (ref 30.0–36.0)
MCV: 102.9 fL — ABNORMAL HIGH (ref 80.0–100.0)
Platelets: 99 10*3/uL — ABNORMAL LOW (ref 150–400)
RBC: 3.48 MIL/uL — ABNORMAL LOW (ref 3.87–5.11)
RDW: 14 % (ref 11.5–15.5)
WBC: 5.7 10*3/uL (ref 4.0–10.5)
nRBC: 0 % (ref 0.0–0.2)

## 2023-01-13 LAB — I-STAT CG4 LACTIC ACID, ED: Lactic Acid, Venous: 4.7 mmol/L (ref 0.5–1.9)

## 2023-01-13 LAB — CBG MONITORING, ED: Glucose-Capillary: 94 mg/dL (ref 70–99)

## 2023-01-13 LAB — LACTIC ACID, PLASMA
Lactic Acid, Venous: 4.2 mmol/L (ref 0.5–1.9)
Lactic Acid, Venous: 3.4 mmol/L (ref 0.5–1.9)

## 2023-01-13 LAB — HIV ANTIBODY (ROUTINE TESTING W REFLEX): HIV Screen 4th Generation wRfx: NONREACTIVE

## 2023-01-13 LAB — PROCALCITONIN: Procalcitonin: 1.03 ng/mL

## 2023-01-13 LAB — MAGNESIUM
Magnesium: 2.6 mg/dL — ABNORMAL HIGH (ref 1.7–2.4)
Magnesium: 2.7 mg/dL — ABNORMAL HIGH (ref 1.7–2.4)

## 2023-01-13 LAB — GLUCOSE, CAPILLARY: Glucose-Capillary: 88 mg/dL (ref 70–99)

## 2023-01-13 LAB — AMMONIA: Ammonia: 24 umol/L (ref 9–35)

## 2023-01-13 LAB — PHOSPHORUS: Phosphorus: 7.9 mg/dL — ABNORMAL HIGH (ref 2.5–4.6)

## 2023-01-13 LAB — BRAIN NATRIURETIC PEPTIDE: B Natriuretic Peptide: 1581.9 pg/mL — ABNORMAL HIGH (ref 0.0–100.0)

## 2023-01-13 SURGERY — TEMPORARY PACEMAKER
Anesthesia: LOCAL

## 2023-01-13 MED ORDER — POLYETHYLENE GLYCOL 3350 17 G PO PACK: 17.0000 g | PACK | Freq: Every day | ORAL | Status: DC | PRN

## 2023-01-13 MED ORDER — LIDOCAINE HCL (PF) 1 % IJ SOLN
INTRAMUSCULAR | Status: DC | PRN
  Administered 2023-01-13: 5 mL

## 2023-01-13 MED ORDER — FENTANYL CITRATE (PF) 100 MCG/2ML IJ SOLN
INTRAMUSCULAR | Status: AC
  Filled 2023-01-13: qty 2

## 2023-01-13 MED ORDER — ATROPINE SULFATE 1 MG/10ML IJ SOSY
0.5000 mg | PREFILLED_SYRINGE | INTRAMUSCULAR | Status: DC | PRN
  Administered 2023-01-13: 0.5 mg via INTRAVENOUS
  Filled 2023-01-13: qty 10

## 2023-01-13 MED ORDER — LIDOCAINE HCL (PF) 1 % IJ SOLN
INTRAMUSCULAR | Status: AC
  Filled 2023-01-13: qty 30

## 2023-01-13 MED ORDER — DEXTROSE 50 % IV SOLN
1.0000 | Freq: Once | INTRAVENOUS | Status: AC
  Administered 2023-01-14: 50 mL via INTRAVENOUS
  Filled 2023-01-13: qty 50

## 2023-01-13 MED ORDER — FENTANYL CITRATE (PF) 100 MCG/2ML IJ SOLN
INTRAMUSCULAR | Status: DC | PRN
  Administered 2023-01-13: 25 ug via INTRAVENOUS

## 2023-01-13 MED ORDER — INSULIN ASPART 100 UNIT/ML IV SOLN
5.0000 [IU] | Freq: Once | INTRAVENOUS | Status: AC
  Administered 2023-01-14: 5 [IU] via INTRAVENOUS

## 2023-01-13 MED ORDER — MORPHINE SULFATE (PF) 2 MG/ML IV SOLN
1.0000 mg | INTRAVENOUS | Status: DC | PRN
  Administered 2023-01-14: 1 mg via INTRAVENOUS
  Filled 2023-01-13: qty 1

## 2023-01-13 MED ORDER — DOPAMINE-DEXTROSE 3.2-5 MG/ML-% IV SOLN
5.0000 ug/kg/min | INTRAVENOUS | Status: DC
  Administered 2023-01-13: 5 ug/kg/min via INTRAVENOUS
  Filled 2023-01-13: qty 250

## 2023-01-13 MED ORDER — ORAL CARE MOUTH RINSE: 15.0000 mL | OROMUCOSAL | Status: DC | PRN

## 2023-01-13 MED ORDER — DOCUSATE SODIUM 100 MG PO CAPS
100.0000 mg | ORAL_CAPSULE | Freq: Two times a day (BID) | ORAL | Status: DC | PRN
  Administered 2023-01-17: 100 mg via ORAL
  Filled 2023-01-13: qty 1

## 2023-01-13 MED ORDER — ATROPINE SULFATE 1 MG/10ML IJ SOSY
PREFILLED_SYRINGE | INTRAMUSCULAR | Status: AC
  Administered 2023-01-13: 1 mg via INTRAVENOUS
  Filled 2023-01-13: qty 10

## 2023-01-13 MED ORDER — ACETAMINOPHEN 325 MG PO TABS
650.0000 mg | ORAL_TABLET | Freq: Four times a day (QID) | ORAL | Status: DC | PRN
  Administered 2023-01-13 – 2023-01-14 (×2): 650 mg via ORAL
  Filled 2023-01-13 (×2): qty 2

## 2023-01-13 MED ORDER — SODIUM BICARBONATE 8.4 % IV SOLN
50.0000 meq | Freq: Once | INTRAVENOUS | Status: DC
  Filled 2023-01-13: qty 50

## 2023-01-13 MED ORDER — CHLORHEXIDINE GLUCONATE CLOTH 2 % EX PADS
6.0000 | MEDICATED_PAD | Freq: Every day | CUTANEOUS | Status: DC
  Administered 2023-01-13 – 2023-01-14 (×2): 6 via TOPICAL

## 2023-01-13 MED ORDER — HEPARIN (PORCINE) IN NACL 1000-0.9 UT/500ML-% IV SOLN
INTRAVENOUS | Status: DC | PRN
  Administered 2023-01-13: 500 mL

## 2023-01-13 MED ORDER — MIDAZOLAM HCL 2 MG/2ML IJ SOLN
INTRAMUSCULAR | Status: AC
  Filled 2023-01-13: qty 2

## 2023-01-13 MED ORDER — LORAZEPAM 2 MG/ML IJ SOLN
1.0000 mg | Freq: Once | INTRAMUSCULAR | Status: AC
  Administered 2023-01-13: 1 mg via INTRAVENOUS
  Filled 2023-01-13: qty 1

## 2023-01-13 MED ORDER — SODIUM ZIRCONIUM CYCLOSILICATE 10 G PO PACK
10.0000 g | PACK | Freq: Once | ORAL | Status: AC
  Administered 2023-01-14: 10 g via ORAL
  Filled 2023-01-13: qty 1

## 2023-01-13 MED ORDER — CALCIUM GLUCONATE 10 % IV SOLN
1.0000 g | Freq: Once | INTRAVENOUS | Status: DC
  Filled 2023-01-13: qty 10

## 2023-01-13 MED ORDER — MIDAZOLAM HCL 2 MG/2ML IJ SOLN
INTRAMUSCULAR | Status: DC | PRN
  Administered 2023-01-13: 1 mg via INTRAVENOUS

## 2023-01-13 MED ORDER — CALCIUM GLUCONATE-NACL 1-0.675 GM/50ML-% IV SOLN
1.0000 g | Freq: Once | INTRAVENOUS | Status: AC
  Administered 2023-01-14: 1000 mg via INTRAVENOUS
  Filled 2023-01-13: qty 50

## 2023-01-13 MED ORDER — FENTANYL CITRATE PF 50 MCG/ML IJ SOSY
50.0000 ug | PREFILLED_SYRINGE | Freq: Once | INTRAMUSCULAR | Status: AC
  Administered 2023-01-13: 50 ug via INTRAVENOUS
  Filled 2023-01-13: qty 1

## 2023-01-13 SURGICAL SUPPLY — 9 items
CABLE ADAPT PACING TEMP 12FT (ADAPTER) IMPLANT
CATH-GARD ARROW CATH SHIELD (MISCELLANEOUS) ×1
PACK CARDIAC CATHETERIZATION (CUSTOM PROCEDURE TRAY) ×1 IMPLANT
SHEATH PINNACLE 6F 10CM (SHEATH) IMPLANT
SHEATH PROBE COVER 6X72 (BAG) IMPLANT
SHIELD CATHGARD ARROW (MISCELLANEOUS) IMPLANT
WIRE MICRO SET SILHO 5FR 7 (SHEATH) IMPLANT
WIRE MICROINTRODUCER 60CM (WIRE) IMPLANT
WIRE PACING TEMP ST TIP 5 (CATHETERS) IMPLANT

## 2023-01-13 NOTE — ED Triage Notes (Signed)
BIB EMS from home 830am started to feel SOB. Took Albuterol inhaler a few hours later took inhaler again with no relief and then started to feel dizzy. No history of respiratory issues. Was in Heart block with EMS with HR in 40s. Pt is on dialysis and was dialyzed yesterday. Denies any pain.   From EMS 18g R hand sodium  1 calcium Cbg 200 170/90 initially  140/72 on arrival  Pt has not had morning medication.

## 2023-01-13 NOTE — Progress Notes (Signed)
eLink Physician-Brief Progress Note Patient Name: Nilaja Hoxsie DOB: May 22, 1951 MRN: [id number]   Date of Service  01/13/2023  HPI/Events of Note  71/M with ESRD, alcoholic cirrhosis, HTN, DM, prior hx of complete heart block, presenting with acute shortness of breath, found to have complete heart block. Pt given atropine and dopamine gtt.  Pt is now with temp transvenous pacer in place.    BP 120/53, HR 60, RR 25, o2 sats 98% on room air. Pt appears comfortable, not in distress.  eICU Interventions  Follow up repeat BMP.  Continue to trend lactate. Continue to monitor with transvenous pacer.  She is paced at 60.  EP consult is pending. HD as per Renal. SCDs for DVT prophylaxis.      Intervention Category Evaluation Type: New Patient Evaluation  Larinda Buttery 01/13/2023, 7:56 PM

## 2023-01-13 NOTE — ED Notes (Signed)
Pt placed back in bed, instructed not to get out of bed. Connected back to monitor and pacer pads.

## 2023-01-13 NOTE — H&P (Signed)
NAME:  Stacey Chambers, MRN:  [id number], DOB:  07-Jan-1951, LOS: 0 ADMISSION DATE:  01/13/2023, CONSULTATION DATE:  7/13 REFERRING MD:  Rhunette Croft, CHIEF COMPLAINT:  CHB   History of Present Illness:  72 year old [name]-speaking female who presented to the emergency room with chief complaint of acute shortness of breath and presyncope.  Because of this she called emergency response, she was found to be in complete heart block.  Was given IV atropine with little effect and subsequently started on dopamine with improvement in heart rate, shortness of breath, and blood pressure.  She was seen by cardiology with plan to bring her to cath lab and place temporary pacer.  With plan to ultimately be seen by EP and likely require permanent pacemaker  Pertinent  Medical History  End-stage renal disease last dialysis 7/12 (Monday/Wednesday/Friday schedule) history of alcoholic related cirrhosis, hypertension, diabetes, anemia, prior admission for complete heart block in April of this year felt secondary to hyperkalemia and metabolic derangements.  Chronic back pain.    Significant Hospital Events: Including procedures, antibiotic start and stop dates in addition to other pertinent events   Admitted with complete heart block placed on dopamine with improved symptoms seen by cardiology with plan to place intravenous pacemaker  Interim History / Subjective:  Reports some back discomfort  Objective   Blood pressure 115/64, pulse (Abnormal) 47, temperature 98.3 F (36.8 C), temperature source Oral, resp. rate 18, height 5\' 1"  (1.549 m), weight 63.2 kg, SpO2 97%.       No intake or output data in the 24 hours ending 01/13/23 1840 Filed Weights   01/13/23 1215  Weight: 63.2 kg    Examination: General: 72 year old non-English-speaking female currently sitting up in bed she is restless but in no acute distress HENT: Normocephalic atraumatic no clear jugular venous distention her mucous membranes are  moist Lungs: Decreased bases no accessory use currently on 2 L/min nasal cannula portable chest x-ray was personally reviewed This shows cardiomegaly, bilateral volume loss with small to moderate bilateral pleural effusions as well as element of interstitial edema Cardiovascular: Regular irregular currently on 5 mics per minute per kilogram of dopamine systolic blood pressure in the 130s, holosystolic heart murmur audible Abdomen: Soft not tender Extremities: Warm dry.  Has left upper extremity AV fistula with good bruit and thrill Neuro: Awake oriented no focal deficits GU: An uric last dialysis was on 7/12  Resolved Hospital Problem list     Assessment & Plan:  Complete heart block with cardiogenic shock and resultant lactic acidosis -Previously felt to be metabolic in nature, however she has no significant acidosis, and a potassium of 5 seems unlikely to precipitate this raising the concern for primary conduction abnormality Plan Admit to the intensive care Continue dopamine for mean arterial pressure greater than 65 Cardiology consulted with plan to place transvenous pacemaker, with subsequent electrophysiology consultation to follow Keep euvolemic Continue telemetry monitoring Cycle cardiac enzymes Defer timing of echocardiogram to cardiology Repeat lactate  Acute dyspnea secondary to pulmonary edema, and mild pleural effusions.  Suspect decompensated heart failure due to CHB Plan Supplemental oxygen Keep head elevated Continue pulse oximetry Low threshold for NIPPV Will likely need early dialysis compared to typical schedule of Monday Wednesday Friday Low dose morphine   End-stage renal disease (Monday Wednesday Friday schedule) last dialysis on 7/12 Plan Will send message to [facility] Will likely need dialysis prior to Monday given borderline hyperkalemia  Fluid and electrolyte imbalance: hyponatremia, Hyperkalemia, Plan Will follow up  H/o cirrhosis  Plan Trend  LFTs  Chronic anemia w/out evidence of bleeding Plan Trend Trigger for transfusion < 7  Chronic thrombocytopenia Plan Monitor   Chronic back Pain Plan Low dose morphine then transition back to tylenol   Best Practice (right click and "Reselect all SmartList Selections" daily)   Diet/type: NPO DVT prophylaxis: SCD GI prophylaxis: N/A Lines: N/A Foley:  N/A Code Status:  full code Last date of multidisciplinary goals of care discussion [pending]  Labs   CBC: Recent Labs  Lab 01/13/23 1200 01/13/23 1226  WBC 5.7  --   HGB 11.9* 12.6  11.9*  HCT 35.8* 37.0  35.0*  MCV 102.9*  --   PLT 99*  --     Basic Metabolic Panel: Recent Labs  Lab 01/13/23 1200 01/13/23 1226  NA 127* 127*  127*  K 5.0 5.0  5.1  CL 89* 92*  CO2 22  --   GLUCOSE 119* 118*  BUN 26* 26*  CREATININE 3.93* 4.10*  CALCIUM 10.3  --   MG 2.6*  --   PHOS 7.9*  --    GFR: Estimated Creatinine Clearance: 11 mL/min (A) (by C-G formula based on SCr of 4.1 mg/dL (H)). Recent Labs  Lab 01/13/23 1200 01/13/23 1341 01/13/23 1348  WBC 5.7  --   --   LATICACIDVEN  --  4.2* 4.7*    Liver Function Tests: Recent Labs  Lab 01/13/23 1200  AST 33  ALT 19  ALKPHOS 116  BILITOT 1.0  PROT 7.9  ALBUMIN 3.3*   No results for input(s): "LIPASE", "AMYLASE" in the last 168 hours. Recent Labs  Lab 01/13/23 1352  AMMONIA 24    ABG    Component Value Date/Time   PHART 7.275 (L) 10/20/2022 1336   PCO2ART 50.7 (H) 10/20/2022 1336   PO2ART 168 (H) 10/20/2022 1336   HCO3 25.6 01/13/2023 1226   TCO2 24 01/13/2023 1226   TCO2 27 01/13/2023 1226   ACIDBASEDEF 4.0 (H) 10/20/2022 1336   O2SAT 90 01/13/2023 1226     Coagulation Profile: Recent Labs  Lab 01/13/23 1200  INR 1.4*    Cardiac Enzymes: No results for input(s): "CKTOTAL", "CKMB", "CKMBINDEX", "TROPONINI" in the last 168 hours.  HbA1C: Hemoglobin A1C  Date/Time Value Ref Range Status  07/12/2022 10:43 AM 4.7 4.0 - 5.6 %  Final   Hgb A1c MFr Bld  Date/Time Value Ref Range Status  03/30/2022 01:27 PM 5.1 4.6 - 6.5 % Final    Comment:    Glycemic Control Guidelines for People with Diabetes:Non Diabetic:  <6%Goal of Therapy: <7%Additional Action Suggested:  >8%   12/02/2021 04:58 AM 4.9 4.8 - 5.6 % Final    Comment:    (NOTE) Pre diabetes:          5.7%-6.4%  Diabetes:              >6.4%  Glycemic control for   <7.0% adults with diabetes     CBG: Recent Labs  Lab 01/13/23 1221  GLUCAP 94    Review of Systems:   Review of Systems  Constitutional: Negative.   HENT: Negative.    Eyes: Negative.   Respiratory:  Positive for shortness of breath.   Cardiovascular:  Positive for orthopnea.  Gastrointestinal: Negative.   Genitourinary: Negative.   Musculoskeletal: Negative.   Neurological:  Positive for dizziness, sensory change and weakness.  Endo/Heme/Allergies: Negative.   Psychiatric/Behavioral: Negative.       Past Medical History:  She,  has a past medical history of Anemia, Cirrhosis (HCC), Diabetes mellitus without complication (HCC), ESRD (end stage renal disease) on dialysis (HCC), Hypertension, Type 2 diabetes mellitus (HCC), and Vision loss.   Surgical History:   Past Surgical History:  Procedure Laterality Date   AV FISTULA PLACEMENT Left 12/23/2021   Procedure: INSERTION OF LEFT ARM BRACHIOCEPHALIC ARTERIOVENOUS FISTULA;  Surgeon: Cephus Shelling, MD;  Location: [facility] OR;  Service: Vascular;  Laterality: Left;   BACK SURGERY     COLONOSCOPY     IR FLUORO GUIDE CV LINE RIGHT  12/16/2021   IR US GUIDE VASC ACCESS RIGHT  12/16/2021   UPPER GASTROINTESTINAL ENDOSCOPY       Social History:   reports that she has never smoked. She has never used smokeless tobacco. She reports that she does not currently use drugs after having used the following drugs: Marijuana. She reports that she does not drink alcohol.   Family History:  Her family history includes Diabetes in her brother.  There is no history of Colon cancer, Esophageal cancer, Stomach cancer, Pancreatic cancer, Inflammatory bowel disease, Liver disease, Rectal cancer, or Colon polyps.   Allergies No Known Allergies   Home Medications  Prior to Admission medications   Medication Sig Start Date End Date Taking? Authorizing Provider  acetaminophen (TYLENOL) 500 MG tablet Take 500 mg by mouth every 8 (eight) hours as needed for moderate pain.    [provider]  albuterol (VENTOLIN HFA) 108 (90 Base) MCG/ACT inhaler Inhale into the lungs. 12/06/22   [provider]  guaiFENesin (ROBITUSSIN) 100 MG/5ML liquid Take 5 mLs by mouth every 4 (four) hours as needed for cough or to loosen phlegm. 10/27/22   Willeen Niece, MD  hydrALAZINE (APRESOLINE) 50 MG tablet Take 1 tablet (50 mg total) by mouth 3 (three) times daily. 12/19/22 03/19/23  Maisie Fus, MD  losartan (COZAAR) 100 MG tablet Take 100 mg by mouth daily. 12/06/22   [provider]  mirtazapine (REMERON) 15 MG tablet Take 15 mg by mouth at bedtime. 10/10/22   [provider]  sevelamer carbonate (RENVELA) 800 MG tablet Take 1 tablet (800 mg total) by mouth 3 (three) times daily with meals. 10/27/22   Willeen Niece, MD     Critical care time: 45 minutes      Simonne Martinet ACNP-BC [facility] Pager # [phone] OR # [phone] if no answer

## 2023-01-13 NOTE — ED Notes (Signed)
Critical I-Stat Lactic Acid given to RN Moquishia

## 2023-01-13 NOTE — Progress Notes (Signed)
eLink Physician-Brief Progress Note Patient Name: Stacey Chambers DOB: 12-Dec-1950 MRN: [id number]   Date of Service  01/13/2023  HPI/Events of Note  Pt complaining of chronic back pain.  She stated that tylenol helps.   eICU Interventions  Tylenol ordered PRN.     Intervention Category Intermediate Interventions: Pain - evaluation and management  Larinda Buttery 01/13/2023, 10:31 PM

## 2023-01-13 NOTE — ED Provider Notes (Signed)
[facility] EMERGENCY DEPARTMENT AT [facility] Provider Note   CSN: [id number] Arrival date & time: 01/13/23  1149     History  Chief Complaint  Patient presents with   Shortness of Breath   Dizziness    Stacey Chambers is a 72 y.o. female.  HPI     3F with ESRD on HD, DMII, HFpEF and cirrhosis who is brought to the emergency room via EMS with chief complaint of shortness of breath, dizziness.  Patient also has previous history of high-grade AV block thought to be because of her renal disease, this was back in April when she was admitted to the hospital and intubated for seizure and shortness of breath and found to have profound bradycardia.  Patient indicates that her last dialysis session was yesterday.  She started feeling dizzy and short of breath earlier this morning.  Symptoms are typically present with exertion.  At rest, she is currently feeling fine, but has had episodes of dizziness and shortness of breath even at rest.  EMS gave patient bolus of calcium gluconate and sodium bicarb prior to ED transfer.  Patient was started on hydralazine by cardiology team recently.  She does not appear to be on any beta-blocker or calcium channel blocker.  Home Medications Prior to Admission medications   Medication Sig Start Date End Date Taking? Authorizing Provider  acetaminophen (TYLENOL) 500 MG tablet Take 500 mg by mouth every 8 (eight) hours as needed for moderate pain.    [provider]  albuterol (VENTOLIN HFA) 108 (90 Base) MCG/ACT inhaler Inhale into the lungs. 12/06/22   [provider]  guaiFENesin (ROBITUSSIN) 100 MG/5ML liquid Take 5 mLs by mouth every 4 (four) hours as needed for cough or to loosen phlegm. 10/27/22   Willeen Niece, MD  hydrALAZINE (APRESOLINE) 50 MG tablet Take 1 tablet (50 mg total) by mouth 3 (three) times daily. 12/19/22 03/19/23  Maisie Fus, MD  losartan (COZAAR) 100 MG tablet Take 100 mg by mouth daily. 12/06/22    [provider]  mirtazapine (REMERON) 15 MG tablet Take 15 mg by mouth at bedtime. 10/10/22   [provider]  sevelamer carbonate (RENVELA) 800 MG tablet Take 1 tablet (800 mg total) by mouth 3 (three) times daily with meals. 10/27/22   Willeen Niece, MD      Allergies    Patient has no known allergies.    Review of Systems   Review of Systems  All other systems reviewed and are negative.   Physical Exam Updated Vital Signs BP (!) 119/46   Pulse 62   Temp 98.3 F (36.8 C) (Oral)   Resp 20   Ht 5\' 1"  (1.549 m)   Wt 63.2 kg   SpO2 95%   BMI 26.33 kg/m  Physical Exam Vitals and nursing note reviewed.  Constitutional:      Appearance: She is well-developed.  HENT:     Head: Atraumatic.  Cardiovascular:     Rate and Rhythm: Bradycardia present.  Pulmonary:     Effort: Pulmonary effort is normal.     Breath sounds: No decreased breath sounds, wheezing, rhonchi or rales.  Musculoskeletal:     Cervical back: Normal range of motion and neck supple.     Right lower leg: No tenderness. No edema.     Left lower leg: No tenderness. No edema.  Skin:    General: Skin is warm and dry.  Neurological:     Mental Status: She is  alert and oriented to person, place, and time.     ED Results / Procedures / Treatments   Labs (all labs ordered are listed, but only abnormal results are displayed) Labs Reviewed  CBC - Abnormal; Notable for the following components:      Result Value   RBC 3.48 (*)    Hemoglobin 11.9 (*)    HCT 35.8 (*)    MCV 102.9 (*)    MCH 34.2 (*)    Platelets 99 (*)    All other components within normal limits  MAGNESIUM - Abnormal; Notable for the following components:   Magnesium 2.6 (*)    All other components within normal limits  PHOSPHORUS - Abnormal; Notable for the following components:   Phosphorus 7.9 (*)    All other components within normal limits  COMPREHENSIVE METABOLIC PANEL - Abnormal; Notable for the following  components:   Sodium 127 (*)    Chloride 89 (*)    Glucose, Bld 119 (*)    BUN 26 (*)    Creatinine, Ser 3.93 (*)    Albumin 3.3 (*)    GFR, Estimated 12 (*)    Anion gap 16 (*)    All other components within normal limits  PROTIME-INR - Abnormal; Notable for the following components:   Prothrombin Time 17.6 (*)    INR 1.4 (*)    All other components within normal limits  I-STAT CHEM 8, ED - Abnormal; Notable for the following components:   Sodium 127 (*)    Chloride 92 (*)    BUN 26 (*)    Creatinine, Ser 4.10 (*)    Glucose, Bld 118 (*)    All other components within normal limits  I-STAT VENOUS BLOOD GAS, ED - Abnormal; Notable for the following components:   pH, Ven 7.502 (*)    pCO2, Ven 32.7 (*)    pO2, Ven 52 (*)    Acid-Base Excess 3.0 (*)    Sodium 127 (*)    HCT 35.0 (*)    Hemoglobin 11.9 (*)    All other components within normal limits  I-STAT CG4 LACTIC ACID, ED - Abnormal; Notable for the following components:   Lactic Acid, Venous 4.7 (*)    All other components within normal limits  URINALYSIS, ROUTINE W REFLEX MICROSCOPIC  LACTIC ACID, PLASMA  LACTIC ACID, PLASMA  AMMONIA  CBG MONITORING, ED  TROPONIN I (HIGH SENSITIVITY)  TROPONIN I (HIGH SENSITIVITY)    EKG EKG Interpretation Date/Time:  Saturday January 13 2023 11:59:36 EDT Ventricular Rate:  44 PR Interval:    QRS Duration:  126 QT Interval:  488 QTC Calculation: 418 R Axis:   89  Text Interpretation: AV block, complete (third degree) Right bundle branch block complete heart block is new Confirmed by Derwood Kaplan [phone]) on 01/13/2023 1:12:48 PM  Radiology No results found.  Procedures .Critical Care  Performed by: Derwood Kaplan, MD Authorized by: Derwood Kaplan, MD   Critical care provider statement:    Critical care time (minutes):  85   Critical care was necessary to treat or prevent imminent or life-threatening deterioration of the following conditions:  Circulatory failure  and metabolic crisis   Critical care was time spent personally by me on the following activities:  Development of treatment plan with patient or surrogate, discussions with consultants, evaluation of patient's response to treatment, examination of patient, ordering and review of laboratory studies, ordering and review of radiographic studies, ordering and performing treatments and interventions, pulse oximetry,  re-evaluation of patient's condition and review of old charts     Medications Ordered in ED Medications  calcium gluconate inj 10% (1 g) URGENT USE ONLY! (0 g Intravenous Hold 01/13/23 1231)  sodium bicarbonate injection 50 mEq (0 mEq Intravenous Hold 01/13/23 1225)  atropine 1 MG/10ML injection 0.5 mg (1 mg Intravenous Given 01/13/23 1314)  DOPamine (INTROPIN) 800 mg in dextrose 5 % 250 mL (3.2 mg/mL) infusion (10 mcg/kg/min  63.2 kg Intravenous Rate/Dose Change 01/13/23 1339)    ED Course/ Medical Decision Making/ A&P                             Medical Decision Making Amount and/or Complexity of Data Reviewed Labs: ordered. Radiology: ordered. ECG/medicine tests: ordered.  Risk Prescription drug management. Decision regarding hospitalization.   72 year old patient comes in with chief complaint of shortness of breath.  She also has associated dizziness and near fainting spells.  Patient has history of liver cirrhosis, ESRD on hemodialysis MWF, diastolic dysfunction function and previous history of high-grade.  AV block.  On initial assessment, patient noted to be bradycardic with heart rate in the 40s.  EKG concerning for complete heart block.  I have reviewed patient's recent discharge summary and also cardiology note.  I also reviewed patient's echocardiogram and previous EKGs.  I ordered i-STAT Chem-8 and venous blood gas, but patient's potassium came back at 5.  She did receive calcium gluconate and sodium bicarb per EMS, which could have contributed to the potassium  being falsely accurate right now.  However, patient is still in AV block -so it appears that organically she might have primary cardiac etiology.  Basic labs have been ordered in addition to i-STAT Chem-8 including troponin.  Patient does not have any chest pain right now.  Patient has been placed on cardiac pads.  She is not getting paced at this time.  Reassessment: I consulted cardiology and spoke with Dr. Neomia Glass -she will come and see the patient.  She agrees that patient likely in complete heart block.   Reassessment: Nursing staff made me aware that patient feels dyspneic again.  I have ordered atropine as needed.  Reassessment: Patient feels dyspneic.  She received atropine, heart rate jumped into the 50s but quickly returned back to the 40s.  Patient has shortness of breath that appears to be new, it was not present when I saw her.  There could be a level of anxiety, but it is justified in this case.  I discussed case with Dr. Lalla Brothers and we will start patient on dopamine drip.  I discussed with the patient and the cardiologist the need for probably transvenous pacing or permanent pacemaker.  Dr. Lalla Brothers assessed the patient and the plan is for activating Cath Lab so that they can emergently put in a transvenous pacer.  They requested admission to pulmonary critical care.   I have used translation services for the HPI, assessment, reassessment and discussing the medical plan..  Final Clinical Impression(s) / ED Diagnoses Final diagnoses:  Complete heart block (HCC)  Near syncope  Dyspnea, unspecified type    Rx / DC Orders ED Discharge Orders     None         Derwood Kaplan, MD 01/13/23 1416

## 2023-01-13 NOTE — Progress Notes (Signed)
eLink Physician-Brief Progress Note Patient Name: Stacey Chambers DOB: [birth year] MRN: [id number]   Date of Service  01/13/2023  HPI/Events of Note  Notified of hyperkalemia with K 6.1 <-- 5.0.  Pt with ESRD.   eICU Interventions  Give calcium gluconate, insulin, D50 and lokelma.  Follow K.      Intervention Category Intermediate Interventions: Electrolyte abnormality - evaluation and management  Larinda Buttery 01/13/2023, 11:33 PM  12:01 AM Pt requesting for sleep aide.   Plan> Melatonin ordered at bedtime PRN.

## 2023-01-13 NOTE — ED Notes (Signed)
Pt continuously trying to get out of bed more irritated and agitated. Pt instructed to stay in bed multiple times. Pt moved from room 27 to 29 to promote pt safety.

## 2023-01-13 NOTE — Consult Note (Signed)
Electrophysiology Consultation   Patient ID: Stacey Chambers MRN: [id number]; DOB: 05-May-1951  Admit date: 01/13/2023 Date of Consult: 01/13/2023  PCP:  Corliss Blacker, MD   [facility] Providers Cardiologist:  Maisie Fus, MD   {    History of Present Illness:   Stacey Chambers is a pleasant [phone] [name] speaking woman who I am seeing today for an evaluation of complete heart block at the request of Dr Rhunette Croft. She has a history of ESRD, EtOH cirrhosis, HTN, DM, anemia. She was actually admitted in April of this year with CHB in the setting of hyperkalemia. She experienced seizure like activity with prolonged ventricular standstill. She was intubated during that admission. With dialysis, the conduction improved and she was ultimately discharged without a permanent pacemaker.   After discharge she saw hematology for her anemia. Epo was recommended. She saw Dr Wyline Mood in clinic 12/19/2022. At that visit she was doing well. She has a RBBB at baseline. Her HR that day was 83 bpm.   Today she tells me that she began feeling bad at 8:30am. She was short of breath and felt like she was going to pass out. EMS was called who found her in CHB. Atropine was given with little benefit. In the ER she was started on a dopamine gtt with little improvement.   Today's visit was performed with the assistance of a virtual interpretor.  Past medical, surgical, family and social history has been reviewed.   ROS:  Please see the history of present illness.  All other ROS reviewed and negative.     Physical Exam/Data:   Vitals:   01/13/23 1303 01/13/23 1315 01/13/23 1330 01/13/23 1349  BP:  103/62 (!) 119/46   Pulse: (!) 36 (!) 40 (!) 41 62  Resp: (!) 24 14 15 20   Temp:      TempSrc:      SpO2: 99% 99% 100% 95%  Weight:      Height:       No intake or output data in the 24 hours ending 01/13/23 1354    01/13/2023   12:15 PM 12/19/2022    8:53 AM 12/12/2022    9:03 AM   Last 3 Weights  Weight (lbs) 139 lb 5.3 oz 139 lb 6.4 oz 142 lb 8 oz  Weight (kg) 63.2 kg 63.231 kg 64.638 kg     Body mass index is 26.33 kg/m.   General:  Well nourished, well developed, in no acute distress Cardiac:  normal S1, S2; RRR; no murmur  Lungs:  clear to auscultation bilaterally, no wheezing, rhonchi or rales  Psych:  Normal affect   EKG:  The EKG was personally reviewed and demonstrates:  sinus tachycardia with CHB and a RBBB-like escape rhythm in the upper 30s-low 40s. Her escape rhythm is different from her conducted QRS observed on 12/19/2022 ECG making this more of a ventricular escape.    Telemetry:  Telemetry was personally reviewed and demonstrates:  sinus tachycardia and CHB  Relevant CV Studies:  09/16/2022 Echo  EF 60 RV normal Small PEFf Mild MR    Assessment and Plan:   #CHB Likely due to progressive conduction system disease given preexisting RBBB and prior episodes of CHB. There may be an element of metabolic derangements contributing but this time she is not severely hyperkalemic. Despite having an escape in the upper 30s-low 40s, she is still very symptomatic. This is further supported by her underlying sinus tachycardia in the 150s. I  suspect she would benefit from a higher rate and have recommended a temporary permanent pacemaker.  I have discussed a temporary permanent pacemaker with the patient including the risks and she wishes to proceed. I have spoken with the interventional cardiologist on call, Dr Lynnette Caffey, who will place the TVP.  I suspect she will need a permanent pacemaker prior to discharge. Decision re: leadless vs transvenous to be made later.   Will need admission to medicine.  #Cirrhosis #Thrombocytopenia Platelets this admission 99k. Management per primary. Check ammonia level and lactate.   For questions or updates, please contact [facility] Please consult [email or web address] for contact info under     Signed, Lanier Prude, MD  01/13/2023 1:54 PM

## 2023-01-13 NOTE — ED Notes (Signed)
Pt transported up to Cath lab

## 2023-01-13 NOTE — ED Notes (Signed)
Pt reporting pain in her legs and back. Pt very restless with trying to get out of bed. MD notified and order for pain meds given.

## 2023-01-14 ENCOUNTER — Inpatient Hospital Stay (HOSPITAL_COMMUNITY): Payer: 59

## 2023-01-14 ENCOUNTER — Other Ambulatory Visit: Payer: Self-pay

## 2023-01-14 DIAGNOSIS — I442 Atrioventricular block, complete: Secondary | ICD-10-CM | POA: Diagnosis not present

## 2023-01-14 LAB — BASIC METABOLIC PANEL
Anion gap: 18 — ABNORMAL HIGH (ref 5–15)
BUN: 36 mg/dL — ABNORMAL HIGH (ref 8–23)
CO2: 19 mmol/L — ABNORMAL LOW (ref 22–32)
Calcium: 8.9 mg/dL (ref 8.9–10.3)
Chloride: 90 mmol/L — ABNORMAL LOW (ref 98–111)
Creatinine, Ser: 4.99 mg/dL — ABNORMAL HIGH (ref 0.44–1.00)
GFR, Estimated: 9 mL/min — ABNORMAL LOW (ref >60)
Glucose, Bld: 270 mg/dL — ABNORMAL HIGH (ref 70–99)
Potassium: 4.9 mmol/L (ref 3.5–5.1)
Sodium: 127 mmol/L — ABNORMAL LOW (ref 135–145)

## 2023-01-14 LAB — HEPATIC FUNCTION PANEL
ALT: 18 U/L (ref 0–44)
AST: 36 U/L (ref 15–41)
Albumin: 3 g/dL — ABNORMAL LOW (ref 3.5–5.0)
Alkaline Phosphatase: 89 U/L (ref 38–126)
Bilirubin, Direct: 0.2 mg/dL (ref 0.0–0.2)
Indirect Bilirubin: 1.1 mg/dL — ABNORMAL HIGH (ref 0.3–0.9)
Total Bilirubin: 1.3 mg/dL — ABNORMAL HIGH (ref 0.3–1.2)
Total Protein: 7.4 g/dL (ref 6.5–8.1)

## 2023-01-14 LAB — GLUCOSE, CAPILLARY
Glucose-Capillary: 90 mg/dL (ref 70–99)
Glucose-Capillary: 119 mg/dL — ABNORMAL HIGH (ref 70–99)
Glucose-Capillary: 104 mg/dL — ABNORMAL HIGH (ref 70–99)
Glucose-Capillary: 135 mg/dL — ABNORMAL HIGH (ref 70–99)
Glucose-Capillary: 129 mg/dL — ABNORMAL HIGH (ref 70–99)
Glucose-Capillary: 106 mg/dL — ABNORMAL HIGH (ref 70–99)

## 2023-01-14 LAB — PHOSPHORUS: Phosphorus: 9.3 mg/dL — ABNORMAL HIGH (ref 2.5–4.6)

## 2023-01-14 LAB — BRAIN NATRIURETIC PEPTIDE: B Natriuretic Peptide: 2087.1 pg/mL — ABNORMAL HIGH (ref 0.0–100.0)

## 2023-01-14 LAB — CBC
HCT: 31.2 % — ABNORMAL LOW (ref 36.0–46.0)
Hemoglobin: 10.4 g/dL — ABNORMAL LOW (ref 12.0–15.0)
MCH: 33.7 pg (ref 26.0–34.0)
MCHC: 33.3 g/dL (ref 30.0–36.0)
MCV: 101 fL — ABNORMAL HIGH (ref 80.0–100.0)
Platelets: 101 10*3/uL — ABNORMAL LOW (ref 150–400)
RBC: 3.09 MIL/uL — ABNORMAL LOW (ref 3.87–5.11)
RDW: 14.1 % (ref 11.5–15.5)
WBC: 6.1 10*3/uL (ref 4.0–10.5)
nRBC: 0 % (ref 0.0–0.2)

## 2023-01-14 LAB — MAGNESIUM: Magnesium: 2.7 mg/dL — ABNORMAL HIGH (ref 1.7–2.4)

## 2023-01-14 LAB — POTASSIUM
Potassium: 4.8 mmol/L (ref 3.5–5.1)
Potassium: 5.1 mmol/L (ref 3.5–5.1)
Potassium: 5.1 mmol/L (ref 3.5–5.1)
Potassium: 5 mmol/L (ref 3.5–5.1)

## 2023-01-14 MED ORDER — HYDRALAZINE HCL 50 MG PO TABS
50.0000 mg | ORAL_TABLET | Freq: Three times a day (TID) | ORAL | Status: DC
  Administered 2023-01-14 – 2023-01-16 (×6): 50 mg via ORAL
  Filled 2023-01-14 (×7): qty 1

## 2023-01-14 MED ORDER — DIPHENHYDRAMINE HCL 25 MG PO CAPS
25.0000 mg | ORAL_CAPSULE | Freq: Once | ORAL | Status: AC
  Administered 2023-01-14: 25 mg via ORAL
  Filled 2023-01-14: qty 1

## 2023-01-14 MED ORDER — TRAMADOL HCL 50 MG PO TABS
50.0000 mg | ORAL_TABLET | Freq: Two times a day (BID) | ORAL | Status: DC | PRN
  Administered 2023-01-14 – 2023-01-17 (×4): 50 mg via ORAL
  Filled 2023-01-14 (×6): qty 1

## 2023-01-14 MED ORDER — CHLORHEXIDINE GLUCONATE CLOTH 2 % EX PADS
6.0000 | MEDICATED_PAD | Freq: Every day | CUTANEOUS | Status: DC
  Administered 2023-01-16: 6 via TOPICAL

## 2023-01-14 MED ORDER — ACETAMINOPHEN 500 MG PO TABS
500.0000 mg | ORAL_TABLET | Freq: Four times a day (QID) | ORAL | Status: DC | PRN
  Administered 2023-01-14 – 2023-01-16 (×4): 500 mg via ORAL
  Filled 2023-01-14 (×4): qty 1

## 2023-01-14 MED ORDER — MELATONIN 3 MG PO TABS
3.0000 mg | ORAL_TABLET | Freq: Every evening | ORAL | Status: DC | PRN
  Administered 2023-01-14 – 2023-01-16 (×4): 3 mg via ORAL
  Filled 2023-01-14 (×4): qty 1

## 2023-01-14 MED ORDER — MIRTAZAPINE 15 MG PO TABS
15.0000 mg | ORAL_TABLET | Freq: Every day | ORAL | Status: DC
  Administered 2023-01-14 – 2023-01-16 (×3): 15 mg via ORAL
  Filled 2023-01-14 (×3): qty 1

## 2023-01-14 NOTE — Progress Notes (Signed)
   Rounding Note    Patient Name: Stacey Chambers Date of Encounter: 01/14/2023  [facility] Cardiologist: Maisie Fus, MD   Subjective   NAEO. S/p TVP yesterday with Dr Lynnette Caffey. This AM, rhythm has recovered.  Vital Signs    Vitals:   01/14/23 0400 01/14/23 0500 01/14/23 0700 01/14/23 0800  BP: 111/66 (!) 117/54 (!) 143/73 (!) 151/65  Pulse:  70 81 80  Resp:  18 10 (!) 22  Temp:   98.5 F (36.9 C)   TempSrc:      SpO2:  98% 96% 94%  Weight:  66.7 kg    Height:        Intake/Output Summary (Last 24 hours) at 01/14/2023 0815 Last data filed at 01/14/2023 0800 Gross per 24 hour  Intake 84.23 ml  Output 0 ml  Net 84.23 ml      01/14/2023    5:00 AM 01/13/2023    8:00 PM 01/13/2023   12:15 PM  Last 3 Weights  Weight (lbs) 147 lb 0.8 oz 145 lb 11.6 oz 139 lb 5.3 oz  Weight (kg) 66.7 kg 66.1 kg 63.2 kg      Telemetry    Sinus rhythm in the 80s. - Personally Reviewed  ECG    Personally Reviewed  Physical Exam   GEN: No acute distress.   Cardiac: RRR, no murmurs, rubs, or gallops. TVP exiting R internal jugular. Respiratory: Clear to auscultation bilaterally. Psych: Normal affect   Assessment & Plan    #CHB Likely due to progressive conduction system disease given preexisting RBBB and prior episodes of CHB. There may be an element of metabolic derangements contributing but this time she is not severely hyperkalemic.  This AM, rhythm has temporarily recovered. She will still need a permanent pacemaker. Tentatiively planning for Monday if anaesthesia available. Plan for leadless PPM (Micra).   #Cirrhosis #Thrombocytopenia Platelets this admission 99k. Management per primary.   Sheria Lang T. Lalla Brothers, MD, [facility], [address] Cardiac Electrophysiology

## 2023-01-14 NOTE — Consult Note (Signed)
Renal Service Consult Note [facility]  Stacey Chambers 01/14/2023 [name], MD Requesting Physician: Dr. Adaline Sill  Reason for Consult: ESRD pt w/ heart block and bradycardia HPI: The patient is a 72 y.o. year-old w/ PMH as below who presented to ED 7/13 c/o SOB. Took inhaler w/o relief, then started to feel dizzy. In ED was found to have heart block w/ HR in the 40s.  Pt is an ESRD pt and had her usual HD on Saturday. No chest pain. In ED she rec'd IV atropine w/o effect and then was started on dopamine gtt w/ improvement in HR, SOB and BP. Cardiology consulted and found that K+ was not high and that w/ HR in the 30s- 40s pt was sig symptomatic. Interventional cardiology was called in and placed a temporary venous pacemaker yesterday evening. Pt was admitted to cardiac ICU. We are asked to see for dialysis.   Pt seen in ICU.  She is pleasant, resting, lying down. No c/o's.  No CP or SOB. HR's are in the 80s and non-paced at this moment.    ROS - denies CP, no joint pain, no HA, no blurry vision, no rash, no diarrhea, no nausea/ vomiting, no dysuria, no difficulty voiding   Past Medical History  Past Medical History:  Diagnosis Date   Anemia    Cirrhosis (HCC)    Diabetes mellitus without complication (HCC)    ESRD (end stage renal disease) on dialysis (HCC)    Hypertension    Type 2 diabetes mellitus (HCC)    Vision loss    Past Surgical History  Past Surgical History:  Procedure Laterality Date   AV FISTULA PLACEMENT Left 12/23/2021   Procedure: INSERTION OF LEFT ARM BRACHIOCEPHALIC ARTERIOVENOUS FISTULA;  Surgeon: Cephus Shelling, MD;  Location: [facility] OR;  Service: Vascular;  Laterality: Left;   BACK SURGERY     COLONOSCOPY     IR FLUORO GUIDE CV LINE RIGHT  12/16/2021   IR US GUIDE VASC ACCESS RIGHT  12/16/2021   UPPER GASTROINTESTINAL ENDOSCOPY     Family History  Family History  Problem Relation Age of Onset   Diabetes Brother    Colon  cancer Neg Hx    Esophageal cancer Neg Hx    Stomach cancer Neg Hx    Pancreatic cancer Neg Hx    Inflammatory bowel disease Neg Hx    Liver disease Neg Hx    Rectal cancer Neg Hx    Colon polyps Neg Hx    Social History  reports that she has never smoked. She has never used smokeless tobacco. She reports that she does not currently use drugs after having used the following drugs: Marijuana. She reports that she does not drink alcohol. Allergies No Known Allergies Home medications Prior to Admission medications   Medication Sig Start Date End Date Taking? Authorizing Provider  acetaminophen (TYLENOL) 500 MG tablet Take 500 mg by mouth every 8 (eight) hours as needed for moderate pain.    [provider]  albuterol (VENTOLIN HFA) 108 (90 Base) MCG/ACT inhaler Inhale into the lungs. 12/06/22   [provider]  guaiFENesin (ROBITUSSIN) 100 MG/5ML liquid Take 5 mLs by mouth every 4 (four) hours as needed for cough or to loosen phlegm. 10/27/22   Willeen Niece, MD  hydrALAZINE (APRESOLINE) 50 MG tablet Take 1 tablet (50 mg total) by mouth 3 (three) times daily. 12/19/22 03/19/23  Maisie Fus, MD  losartan (COZAAR) 100 MG tablet Take  100 mg by mouth daily. 12/06/22   [provider]  mirtazapine (REMERON) 15 MG tablet Take 15 mg by mouth at bedtime. 10/10/22   [provider]  sevelamer carbonate (RENVELA) 800 MG tablet Take 1 tablet (800 mg total) by mouth 3 (three) times daily with meals. 10/27/22   Willeen Niece, MD     Vitals:   01/14/23 0900 01/14/23 1000 01/14/23 1018 01/14/23 1100  BP: (!) 167/78 (!) 174/77 (!) 161/78 (!) 164/76  Pulse: 84 77  82  Resp: (!) 24 (!) 22 16 (!) 21  Temp:      TempSrc:      SpO2: 99% 95%  100%  Weight:      Height:       Exam Gen alert, no distress No rash, cyanosis or gangrene Sclera anicteric, throat clear  No jvd or bruits Chest clear bilat to bases, no rales/ wheezing RRR no MRG Abd soft ntnd no mass or  ascites +bs GU defer MS no joint effusions or deformity Ext no LE or UE edema, no wounds or ulcers Neuro is alert, Ox 3 , nf    LUA AVF+bruit    Home meds include - norvasc, emla, tylenol, ventolin hfa, robitussin, hydralazine 50 tid, losartan 100 every day, remeron, renvela 1 ac tid     OP HD: SW MWF  3.5h   400/1.5    61.5kg  2/2 bath  AVF  Heparin none - last OP HD was 7/12, post wt 62.1kg - hectorol 2 mcg IV three times per week - venofer 100mg  q 2 wks, last 7/05, due 7/19   Assessment/ Plan: Complete heart block - resolved after TVP placement yesterday evening. To go for permanent PM per cardiology.  ESRD - on HD MWF. Has not missed HD. HD tomorrow.  HTN/ volume - BP's up a bit. 5kg up by wts.  UF goal 3.5 L w/ HD tomorrow.  Anemia esrd - Hb 10-12 here, no esa needs. Cont on IV Fe q 2wks w/ next date being 7/19 if still here.   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 Cont binder, do HD. Cont IV vdra Cirrhosis Chronic back pain Anemia of chronic disease      Rob Arlean Hopping  MD [facility] 01/14/2023, 11:32 AM  Recent Labs  Lab 01/13/23 1200 01/13/23 1226 01/13/23 2132 01/14/23 0031 01/14/23 0803  HGB 11.9* 12.6  11.9*  --  10.4*  --   ALBUMIN 3.3*  --   --  3.0*  --   CALCIUM 10.3  --  9.2 8.9  --   PHOS 7.9*  --   --  9.3*  --   CREATININE 3.93* 4.10* 4.60* 4.99*  --   K 5.0 5.0  5.1 6.1* 4.9 4.8   Inpatient medications:  Chlorhexidine Gluconate Cloth  6 each Topical Daily   hydrALAZINE  50 mg Oral TID   mirtazapine  15 mg Oral QHS    DOPamine 5 mcg/kg/min (01/13/23 1356)   acetaminophen, atropine, docusate sodium, melatonin, morphine injection, mouth rinse, polyethylene glycol, traMADol

## 2023-01-14 NOTE — Significant Event (Cosign Needed Addendum)
Patient is currently hemodynamically stable with an adequate heart rate response.  Status post temporary pacemaker.

## 2023-01-14 NOTE — Progress Notes (Addendum)
eLink Physician-Brief Progress Note Patient Name: Stacey Chambers DOB: 1950/12/28 MRN: [id number]   Date of Service  01/14/2023  HPI/Events of Note  Asking for benadryl for itching all over. No rash.  Breathing is fine. Going for PM for CHB for AM. Received remeron and hydralazine earlier. LFT normal.  Hemodynamically stable, HR at 82. Not on any drips.    eICU Interventions  Benadryl ordered. Qtc: 469     Intervention Category Minor Interventions: Other:  Ranee Gosselin 01/14/2023, 10:59 PM  03:33 Hydralazine parameters added to existing standing orders. For HTN.

## 2023-01-14 NOTE — Progress Notes (Signed)
NAME:  Stacey Chambers, MRN:  [id number], DOB:  [birth year], LOS: 1 ADMISSION DATE:  01/13/2023, CONSULTATION DATE:  7/13 REFERRING MD:  Rhunette Croft, CHIEF COMPLAINT:  CHB   History of Present Illness:  72 year old [name]-speaking female who presented to the emergency room with chief complaint of acute shortness of breath and presyncope.  Because of this she called emergency response, she was found to be in complete heart block.  Was given IV atropine with little effect and subsequently started on dopamine with improvement in heart rate, shortness of breath, and blood pressure.  She was seen by cardiology with plan to bring her to cath lab and place temporary pacer.  With plan to ultimately be seen by EP and likely require permanent pacemaker  Pertinent  Medical History  End-stage renal disease last dialysis 7/12 (Monday/Wednesday/Friday schedule) history of alcoholic related cirrhosis, hypertension, diabetes, anemia, prior admission for complete heart block in April of this year felt secondary to hyperkalemia and metabolic derangements.  Chronic back pain.    Significant Hospital Events: Including procedures, antibiotic start and stop dates in addition to other pertinent events   Admitted with complete heart block placed on dopamine with improved symptoms seen by cardiology with plan to place intravenous pacemaker  Interim History / Subjective:  No distress. HD stable Rhythm recovered Denies pain  Objective   Blood pressure (!) 161/78, pulse 77, temperature 98.5 F (36.9 C), resp. rate 16, height 5\' 1"  (1.549 m), weight 66.7 kg, SpO2 95%.        Intake/Output Summary (Last 24 hours) at 01/14/2023 1042 Last data filed at 01/14/2023 0800 Gross per 24 hour  Intake 202.23 ml  Output 0 ml  Net 202.23 ml   Filed Weights   01/13/23 1215 01/13/23 2000 01/14/23 0500  Weight: 63.2 kg 66.1 kg 66.7 kg    Examination: No distress MMM, trachea midline Heart sounds regular, sinus on  monitor Ext warm Moves to command RASS 0 Answers questions appropriately  Patient Lines/Drains/Airways Status     Active Line/Drains/Airways     Name Placement date Placement time Site Days   Peripheral IV 01/13/23 18 G Right Antecubital 01/13/23  1203  Antecubital  1   Peripheral IV 01/13/23 18 G Right Hand 01/13/23  1204  Hand  1   Fistula / Graft Left Upper arm Arteriovenous fistula 12/23/21  0816  Upper arm  387   Sheath 01/13/23 Right Internal Jugular 01/13/23  1935  Internal Jugular  1   External Urinary Catheter 01/13/23  2100  --  1             Resolved Hospital Problem list     Assessment & Plan:  Complete heart block with cardiogenic shock and resultant lactic acidosis: resolved after TVP; now hypertensive End-stage renal disease (Monday Wednesday Friday schedule) last dialysis on 7/12 Volume overloaded, Fluid and electrolyte imbalance: hyponatremia, Hyperkalemia. Improved. H/o cirrhosis, chronic thrombocytopenia related- compensated at present AOCD Chronic back pain- using tylenol PRN for now; limit to 2g per day Hx HTN  - [place] has seen, HD tomorrow - Start back PRN hydralazine - PPM tentatively planned for tomorrow, will make NPO MN - Tylenol PRN, limit to 2g per day - Tramadol for breakthrough pain - ? If can go home tomorrow after PPM and HD?  Best Practice (right click and "Reselect all SmartList Selections" daily)   Diet/type: NPO MN DVT prophylaxis: SCD GI prophylaxis: N/A Lines: N/A Foley:  N/A Code Status:  full code Last date  of multidisciplinary goals of care discussion [pending]  Myrla Halsted MD PCCM

## 2023-01-15 ENCOUNTER — Other Ambulatory Visit: Payer: Self-pay

## 2023-01-15 ENCOUNTER — Inpatient Hospital Stay (HOSPITAL_COMMUNITY): Payer: 59 | Admitting: Certified Registered Nurse Anesthetist

## 2023-01-15 ENCOUNTER — Encounter (HOSPITAL_COMMUNITY): Payer: Self-pay | Admitting: Internal Medicine

## 2023-01-15 ENCOUNTER — Encounter (HOSPITAL_COMMUNITY)
Admission: EM | Disposition: A | Discharge status: Home Health | Payer: Self-pay | Source: Home / Self Care | Attending: Internal Medicine

## 2023-01-15 DIAGNOSIS — I10 Essential (primary) hypertension: Secondary | ICD-10-CM

## 2023-01-15 DIAGNOSIS — I442 Atrioventricular block, complete: Secondary | ICD-10-CM

## 2023-01-15 DIAGNOSIS — I5021 Acute systolic (congestive) heart failure: Secondary | ICD-10-CM

## 2023-01-15 DIAGNOSIS — Z992 Dependence on renal dialysis: Secondary | ICD-10-CM

## 2023-01-15 DIAGNOSIS — Z006 Encounter for examination for normal comparison and control in clinical research program: Secondary | ICD-10-CM

## 2023-01-15 DIAGNOSIS — I132 Hypertensive heart and chronic kidney disease with heart failure and with stage 5 chronic kidney disease, or end stage renal disease: Secondary | ICD-10-CM

## 2023-01-15 DIAGNOSIS — N186 End stage renal disease: Secondary | ICD-10-CM

## 2023-01-15 HISTORY — PX: PACEMAKER LEADLESS INSERTION: EP1219

## 2023-01-15 LAB — POTASSIUM
Potassium: 4.9 mmol/L (ref 3.5–5.1)
Potassium: 4.9 mmol/L (ref 3.5–5.1)
Potassium: 5.7 mmol/L — ABNORMAL HIGH (ref 3.5–5.1)
Potassium: 4.5 mmol/L (ref 3.5–5.1)
Potassium: 4.7 mmol/L (ref 3.5–5.1)

## 2023-01-15 LAB — GLUCOSE, CAPILLARY
Glucose-Capillary: 101 mg/dL — ABNORMAL HIGH (ref 70–99)
Glucose-Capillary: 121 mg/dL — ABNORMAL HIGH (ref 70–99)
Glucose-Capillary: 140 mg/dL — ABNORMAL HIGH (ref 70–99)
Glucose-Capillary: 155 mg/dL — ABNORMAL HIGH (ref 70–99)
Glucose-Capillary: 213 mg/dL — ABNORMAL HIGH (ref 70–99)
Glucose-Capillary: 97 mg/dL (ref 70–99)

## 2023-01-15 LAB — POCT I-STAT, CHEM 8
BUN: 53 mg/dL — ABNORMAL HIGH (ref 8–23)
Calcium, Ion: 1.08 mmol/L — ABNORMAL LOW (ref 1.15–1.40)
Chloride: 98 mmol/L (ref 98–111)
Creatinine, Ser: 8.1 mg/dL — ABNORMAL HIGH (ref 0.44–1.00)
Glucose, Bld: 118 mg/dL — ABNORMAL HIGH (ref 70–99)
HCT: 30 % — ABNORMAL LOW (ref 36.0–46.0)
Hemoglobin: 10.2 g/dL — ABNORMAL LOW (ref 12.0–15.0)
Potassium: 5.1 mmol/L (ref 3.5–5.1)
Sodium: 130 mmol/L — ABNORMAL LOW (ref 135–145)
TCO2: 23 mmol/L (ref 22–32)

## 2023-01-15 LAB — SURGICAL PCR SCREEN
MRSA, PCR: NEGATIVE
Staphylococcus aureus: NEGATIVE

## 2023-01-15 LAB — HEPATITIS B SURFACE ANTIGEN: Hepatitis B Surface Ag: NONREACTIVE

## 2023-01-15 SURGERY — PACEMAKER LEADLESS INSERTION
Anesthesia: General

## 2023-01-15 MED ORDER — PHENYLEPHRINE HCL-NACL 20-0.9 MG/250ML-% IV SOLN
INTRAVENOUS | Status: DC | PRN
  Administered 2023-01-15: 30 ug/min via INTRAVENOUS

## 2023-01-15 MED ORDER — SODIUM CHLORIDE 0.9% FLUSH
3.0000 mL | Freq: Two times a day (BID) | INTRAVENOUS | Status: DC
  Administered 2023-01-15 – 2023-01-17 (×4): 3 mL via INTRAVENOUS

## 2023-01-15 MED ORDER — FENTANYL CITRATE (PF) 100 MCG/2ML IJ SOLN
INTRAMUSCULAR | Status: AC
  Filled 2023-01-15: qty 2

## 2023-01-15 MED ORDER — CHLORHEXIDINE GLUCONATE 0.12 % MT SOLN
15.0000 mL | Freq: Once | OROMUCOSAL | Status: AC
  Administered 2023-01-15: 15 mL via OROMUCOSAL

## 2023-01-15 MED ORDER — LIDOCAINE HCL 1 % IJ SOLN
INTRAMUSCULAR | Status: AC
  Filled 2023-01-15: qty 20

## 2023-01-15 MED ORDER — SODIUM CHLORIDE 0.9 % IV SOLN
80.0000 mg | INTRAVENOUS | Status: DC
  Filled 2023-01-15: qty 2

## 2023-01-15 MED ORDER — FENTANYL CITRATE (PF) 100 MCG/2ML IJ SOLN
INTRAMUSCULAR | Status: DC | PRN
  Administered 2023-01-15: 50 ug via INTRAVENOUS

## 2023-01-15 MED ORDER — SODIUM CHLORIDE 0.9% FLUSH: 3.0000 mL | INTRAVENOUS | Status: DC | PRN

## 2023-01-15 MED ORDER — LIDOCAINE 2% (20 MG/ML) 5 ML SYRINGE
INTRAMUSCULAR | Status: DC | PRN
  Administered 2023-01-15: 100 mg via INTRAVENOUS

## 2023-01-15 MED ORDER — SODIUM CHLORIDE 0.9 % IV SOLN: INTRAVENOUS | Status: DC

## 2023-01-15 MED ORDER — IOHEXOL 350 MG/ML SOLN
INTRAVENOUS | Status: DC | PRN
  Administered 2023-01-15: 10 mL

## 2023-01-15 MED ORDER — CEFAZOLIN SODIUM-DEXTROSE 2-4 GM/100ML-% IV SOLN
INTRAVENOUS | Status: AC
  Administered 2023-01-15: 2 g via INTRAVENOUS
  Filled 2023-01-15: qty 100

## 2023-01-15 MED ORDER — PROPOFOL 10 MG/ML IV BOLUS
INTRAVENOUS | Status: DC | PRN
  Administered 2023-01-15: 100 mg via INTRAVENOUS

## 2023-01-15 MED ORDER — INSULIN ASPART 100 UNIT/ML IJ SOLN: 0.0000 [IU] | INTRAMUSCULAR | Status: DC | PRN

## 2023-01-15 MED ORDER — PHENYLEPHRINE 80 MCG/ML (10ML) SYRINGE FOR IV PUSH (FOR BLOOD PRESSURE SUPPORT)
PREFILLED_SYRINGE | INTRAVENOUS | Status: DC | PRN
  Administered 2023-01-15: 80 ug via INTRAVENOUS
  Administered 2023-01-15: 120 ug via INTRAVENOUS
  Administered 2023-01-15 (×2): 80 ug via INTRAVENOUS

## 2023-01-15 MED ORDER — HEPARIN SODIUM (PORCINE) 1000 UNIT/ML IJ SOLN
INTRAMUSCULAR | Status: DC | PRN
  Administered 2023-01-15: 4000 [IU] via INTRAVENOUS

## 2023-01-15 MED ORDER — EPHEDRINE SULFATE-NACL 50-0.9 MG/10ML-% IV SOSY
PREFILLED_SYRINGE | INTRAVENOUS | Status: DC | PRN
  Administered 2023-01-15: 5 mg via INTRAVENOUS

## 2023-01-15 MED ORDER — SODIUM CHLORIDE 0.9 % IV SOLN: 250.0000 mL | INTRAVENOUS | Status: DC | PRN

## 2023-01-15 MED ORDER — ONDANSETRON HCL 4 MG/2ML IJ SOLN: 4.0000 mg | Freq: Four times a day (QID) | INTRAMUSCULAR | Status: DC | PRN

## 2023-01-15 MED ORDER — DEXAMETHASONE SODIUM PHOSPHATE 10 MG/ML IJ SOLN
INTRAMUSCULAR | Status: DC | PRN
  Administered 2023-01-15: 4 mg via INTRAVENOUS

## 2023-01-15 MED ORDER — CEFAZOLIN SODIUM-DEXTROSE 2-4 GM/100ML-% IV SOLN
2.0000 g | INTRAVENOUS | Status: AC
  Filled 2023-01-15: qty 100

## 2023-01-15 MED ORDER — ONDANSETRON HCL 4 MG/2ML IJ SOLN
INTRAMUSCULAR | Status: DC | PRN
  Administered 2023-01-15: 4 mg via INTRAVENOUS

## 2023-01-15 MED ORDER — AMLODIPINE BESYLATE 5 MG PO TABS: 2.5000 mg | ORAL_TABLET | Freq: Every day | ORAL | Status: DC

## 2023-01-15 MED ORDER — LIDOCAINE HCL (PF) 1 % IJ SOLN
INTRAMUSCULAR | Status: DC | PRN
  Administered 2023-01-15: 45 mL

## 2023-01-15 MED ORDER — TRAZODONE HCL 50 MG PO TABS: 50.0000 mg | ORAL_TABLET | Freq: Every evening | ORAL | Status: DC | PRN

## 2023-01-15 MED ORDER — SUGAMMADEX SODIUM 200 MG/2ML IV SOLN
INTRAVENOUS | Status: DC | PRN
  Administered 2023-01-15 (×2): 100 mg via INTRAVENOUS

## 2023-01-15 MED ORDER — HEPARIN SODIUM (PORCINE) 1000 UNIT/ML IJ SOLN
INTRAMUSCULAR | Status: AC
  Filled 2023-01-15: qty 1

## 2023-01-15 MED ORDER — ROCURONIUM BROMIDE 10 MG/ML (PF) SYRINGE
PREFILLED_SYRINGE | INTRAVENOUS | Status: DC | PRN
  Administered 2023-01-15: 60 mg via INTRAVENOUS

## 2023-01-15 MED ORDER — CHLORHEXIDINE GLUCONATE 4 % EX SOLN
60.0000 mL | Freq: Once | CUTANEOUS | Status: DC
  Filled 2023-01-15: qty 60

## 2023-01-15 MED ORDER — HEPARIN (PORCINE) IN NACL 1000-0.9 UT/500ML-% IV SOLN
INTRAVENOUS | Status: DC | PRN
  Administered 2023-01-15: 500 mL

## 2023-01-15 SURGICAL SUPPLY — 11 items
CABLE SURGICAL S-101-97-12 (CABLE) ×1 IMPLANT
CLOSURE PERCLOSE PROSTYLE (VASCULAR PRODUCTS) IMPLANT
MICRA INTRODUCER SHEATH (SHEATH) ×1
PACEMAKER LEADLESS AV2 MICRA (Pacemaker) IMPLANT
PAD DEFIB RADIO PHYSIO CONN (PAD) ×1 IMPLANT
SHEATH DILAT COONS TAPER 22F (SHEATH) IMPLANT
SHEATH INTRODUCER MICRA (SHEATH) IMPLANT
SHEATH PINNACLE 8F 10CM (SHEATH) IMPLANT
SHEATH PROBE COVER 6X72 (BAG) IMPLANT
TRAY PACEMAKER INSERTION (PACKS) ×1 IMPLANT
WIRE AMPLATZ SS-J .035X180CM (WIRE) IMPLANT

## 2023-01-15 NOTE — Progress Notes (Signed)
Rounding Note    Patient Name: Stacey Chambers Date of Encounter: 01/15/2023  [facility] Cardiologist: Maisie Fus, MD   Subjective   Feels well, no CP, SOB, didn't sleep well last night Had some mild generalized pruritus, that has resolved  Inpatient Medications    Scheduled Meds:  chlorhexidine  60 mL Topical Once   [MAR Hold] Chlorhexidine Gluconate Cloth  6 each Topical Daily   [MAR Hold] Chlorhexidine Gluconate Cloth  6 each Topical Q0600   gentamicin (GARAMYCIN) 80 mg in sodium chloride 0.9 % 500 mL irrigation  80 mg Irrigation On Call   [MAR Hold] hydrALAZINE  50 mg Oral TID   [MAR Hold] mirtazapine  15 mg Oral QHS   Continuous Infusions:  sodium chloride     sodium chloride      ceFAZolin (ANCEF) IV     [MAR Hold] DOPamine 5 mcg/kg/min (01/13/23 1356)   PRN Meds: [MAR Hold] acetaminophen, [MAR Hold] atropine, [MAR Hold] docusate sodium, [MAR Hold] melatonin, [MAR Hold]  morphine injection, [MAR Hold] mouth rinse, [MAR Hold] polyethylene glycol, [MAR Hold] traMADol   Vital Signs    Vitals:   01/15/23 0605 01/15/23 0635 01/15/23 0700 01/15/23 0802  BP: (!) 169/75 139/71 139/76 (!) 179/79  Pulse: 79 79 86 88  Resp: 15 13 19 18   Temp:    98.2 F (36.8 C)  TempSrc:    Oral  SpO2: 94% 94% 92% 93%  Weight:    65.1 kg  Height:    5\' 1"  (1.549 m)    Intake/Output Summary (Last 24 hours) at 01/15/2023 0824 Last data filed at 01/15/2023 0600 Gross per 24 hour  Intake 480 ml  Output 300 ml  Net 180 ml      01/15/2023    8:02 AM 01/15/2023    5:00 AM 01/14/2023    5:00 AM  Last 3 Weights  Weight (lbs) 143 lb 8.3 oz 143 lb 8.3 oz 147 lb 0.8 oz  Weight (kg) 65.1 kg 65.1 kg 66.7 kg      Telemetry    SR, less V pacing the last 12 hours or so, though continues to require V pacing intermittently  - Personally Reviewed  ECG    No new EKGs - Personally Reviewed  Physical Exam   GEN: No acute distress.   Neck: No JVD Cardiac: RRR, no  murmurs, rubs, or gallops.  Respiratory: CTA. GI: Soft, nontender, non-distended  MS: No edema; No deformity. Neuro:  Nonfocal  Psych: Normal affect  Skin: no rashes  Labs    High Sensitivity Troponin:   Recent Labs  Lab 01/13/23 1200 01/13/23 1341  TROPONINIHS 15 17     Chemistry Recent Labs  Lab 01/13/23 1200 01/13/23 1226 01/13/23 2132 01/14/23 0031 01/14/23 0803 01/14/23 2324 01/15/23 0400 01/15/23 0816  NA 127*   < > 128* 127*  --   --   --  130*  K 5.0   < > 6.1* 4.9   < > 4.9 4.9 5.1  CL 89*   < > 90* 90*  --   --   --  98  CO2 22  --  21* 19*  --   --   --   --   GLUCOSE 119*   < > 87 270*  --   --   --  118*  BUN 26*   < > 33* 36*  --   --   --  53*  CREATININE 3.93*   < >  4.60* 4.99*  --   --   --  8.10*  CALCIUM 10.3  --  9.2 8.9  --   --   --   --   MG 2.6*  --  2.7* 2.7*  --   --   --   --   PROT 7.9  --   --  7.4  --   --   --   --   ALBUMIN 3.3*  --   --  3.0*  --   --   --   --   AST 33  --   --  36  --   --   --   --   ALT 19  --   --  18  --   --   --   --   ALKPHOS 116  --   --  89  --   --   --   --   BILITOT 1.0  --   --  1.3*  --   --   --   --   GFRNONAA 12*  --  10* 9*  --   --   --   --   ANIONGAP 16*  --  17* 18*  --   --   --   --    < > = values in this interval not displayed.    Lipids No results for input(s): "CHOL", "TRIG", "HDL", "LABVLDL", "LDLCALC", "CHOLHDL" in the last 168 hours.  Hematology Recent Labs  Lab 01/13/23 1200 01/13/23 1226 01/14/23 0031 01/15/23 0816  WBC 5.7  --  6.1  --   RBC 3.48*  --  3.09*  --   HGB 11.9* 12.6  11.9* 10.4* 10.2*  HCT 35.8* 37.0  35.0* 31.2* 30.0*  MCV 102.9*  --  101.0*  --   MCH 34.2*  --  33.7  --   MCHC 33.2  --  33.3  --   RDW 14.0  --  14.1  --   PLT 99*  --  101*  --    Thyroid No results for input(s): "TSH", "FREET4" in the last 168 hours.  BNP Recent Labs  Lab 01/13/23 2132 01/14/23 0031  BNP 1,581.9* 2,087.1*    DDimer No results for input(s): "DDIMER" in the last  168 hours.   Radiology    DG Chest Port 1 View Result Date: 01/14/2023 CLINICAL DATA:  Pulmonary edema. EXAM: PORTABLE CHEST 1 VIEW COMPARISON:  Chest x-ray from yesterday. FINDINGS: Unchanged temporary pacing wire in the right ventricle. Stable cardiomegaly. Unchanged diffuse interstitial thickening with bibasilar atelectasis and small to moderate right pleural effusion. No pneumothorax. No acute osseous abnormality. IMPRESSION: 1. Unchanged congestive heart failure. Electronically Signed   By: Obie Dredge M.D.   On: 01/14/2023 09:02      Cardiac Studies    09/16/22: TTE 1. Left ventricular ejection fraction, by estimation, is 60 to 65%. The  left ventricle has normal function. The left ventricle has no regional  wall motion abnormalities. There is mild left ventricular hypertrophy.  Left ventricular diastolic parameters  are indeterminate.   2. Right ventricular systolic function is normal. The right ventricular  size is normal. There is mildly elevated pulmonary artery systolic  pressure. The estimated right ventricular systolic pressure is 41.9 mmHg.   3. Left atrial size was moderately dilated.   4. A small pericardial effusion is present. The pericardial effusion is  circumferential. There is no evidence of cardiac tamponade.   5.  The mitral valve is normal in structure. Mild mitral valve  regurgitation. No evidence of mitral stenosis.   6. Tricuspid valve regurgitation is moderate.   7. The aortic valve is calcified. There is mild calcification of the  aortic valve. There is mild thickening of the aortic valve. Aortic valve  regurgitation is not visualized. Mild aortic valve stenosis. Aortic valve  area, by VTI measures 2.07 cm.  Aortic valve mean gradient measures 10.0 mmHg. Aortic valve Vmax measures  2.16 m/s.   8. The inferior vena cava is normal in size with <50% respiratory  variability, suggesting right atrial pressure of 8 mmHg.   Comparison(s): No significant  change from prior study. Prior images  reviewed side by side.   Patient Profile     72 y.o. female w/PMHx of ESFR on HD, DM, HTN, ETOH/cirrosis, chronic IDA/thrombocytopenia, RBBB admitted with symptomatic bradycardia/CHB  Temp wire placed 01/13/23  Assessment & Plan    Today's visit is done with Video interpretor : Amil Amen # [id number]  CHB Baseline RBBB No reversible causes Recurrent CHB felt secondary tp progressive conduction system disease Planned for Mica AV implant today Discussed/revisited ration for PPM implant today with the patient Discussed implant procedure, potential risks/benefits She remains agreeable to proceed   Continue as per IM/attending team and nephrology  HTN DM ESRF Cirrhosis Chronic anemia thrombocytopenia   For questions or updates, please contact [facility] Please consult [email or web address] for contact info under        Signed, Sheilah Pigeon, PA-C  01/15/2023, 8:24 AM

## 2023-01-15 NOTE — Procedures (Signed)
I was present at this dialysis session. I have reviewed the session itself and made appropriate changes.   Filed Weights   01/14/23 0500 01/15/23 0500 01/15/23 0802  Weight: 66.7 kg 65.1 kg 65.1 kg    Recent Labs  Lab 01/14/23 0031 01/14/23 0803 01/15/23 0816 01/15/23 1249  NA 127*  --  130*  --   K 4.9   < > 5.1 5.7*  CL 90*  --  98  --   CO2 19*  --   --   --   GLUCOSE 270*  --  118*  --   BUN 36*  --  53*  --   CREATININE 4.99*  --  8.10*  --   CALCIUM 8.9  --   --   --   PHOS 9.3*  --   --   --    < > = values in this interval not displayed.    Recent Labs  Lab 01/13/23 1200 01/13/23 1226 01/14/23 0031 01/15/23 0816  WBC 5.7  --  6.1  --   HGB 11.9* 12.6  11.9* 10.4* 10.2*  HCT 35.8* 37.0  35.0* 31.2* 30.0*  MCV 102.9*  --  101.0*  --   PLT 99*  --  101*  --     Scheduled Meds:  amLODipine  2.5 mg Oral Daily   Chlorhexidine Gluconate Cloth  6 each Topical Daily   Chlorhexidine Gluconate Cloth  6 each Topical Q0600   hydrALAZINE  50 mg Oral TID   mirtazapine  15 mg Oral QHS   sodium chloride flush  3 mL Intravenous Q12H   Continuous Infusions:  sodium chloride     PRN Meds:.sodium chloride, acetaminophen, atropine, docusate sodium, melatonin, ondansetron (ZOFRAN) IV, mouth rinse, polyethylene glycol, sodium chloride flush, traMADol, traZODone   Anthony Sar, MD [facility] 01/15/2023, 2:40 PM

## 2023-01-15 NOTE — Progress Notes (Signed)
[facility] Progress Note    Assessment/ Plan:   Complete heart block - resolved after TVP placement yesterday evening. To go for permanent PM per cardiology today.  ESRD - on HD MWF. HD today HTN/ volume - BP's up a bit. 5kg up by wts.  UF goal 3.5 L w/ HD today Anemia esrd - Hb 10-12 here, no esa needs. Cont on IV Fe q 2wks w/ next date being 7/19 if still here.   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 Cont binder, do HD. Cont IV vdra Cirrhosis- per primary Chronic back pain-per primary   OP HD: SW MWF  3.5h   400/1.5    61.5kg  2/2 bath  AVF  Heparin none - last OP HD was 7/12, post wt 62.1kg - hectorol 2 mcg IV three times per week - venofer 100mg  q 2 wks, last 7/05, due 7/19  Subjective:   Patient seen and examined this AM in ICU. No acute events. Has some itchiness but otherwise no complaints. Going for PPM today   Objective:   BP (!) 179/79   Pulse 88   Temp 98.2 F (36.8 C) (Oral)   Resp 18   Ht 5\' 1"  (1.549 m)   Wt 65.1 kg   SpO2 93%   BMI 27.12 kg/m   Intake/Output Summary (Last 24 hours) at 01/15/2023 7829 Last data filed at 01/15/2023 0600 Gross per 24 hour  Intake 480 ml  Output 300 ml  Net 180 ml   Weight change: 1.9 kg  Physical Exam: Gen: NAD, laying flat in bed CVS: RRR, TVP in place Resp: CTA B/L Abd: soft Ext: no sig edema b/l Les Neuro: awake, alert Dialysis access: LUE AVF +b/t  Imaging: DG Chest Port 1 View  Result Date: 01/14/2023 CLINICAL DATA:  Pulmonary edema. EXAM: PORTABLE CHEST 1 VIEW COMPARISON:  Chest x-ray from yesterday. FINDINGS: Unchanged temporary pacing wire in the right ventricle. Stable cardiomegaly. Unchanged diffuse interstitial thickening with bibasilar atelectasis and small to moderate right pleural effusion. No pneumothorax. No acute osseous abnormality. IMPRESSION: 1. Unchanged congestive heart failure. Electronically Signed   By: Obie Dredge M.D.   On: 01/14/2023 09:02   DG Chest 1 View  Result  Date: 01/13/2023 CLINICAL DATA:  Central line placement EXAM: CHEST  1 VIEW COMPARISON:  01/13/2023 FINDINGS: A right IJ introducer sheath is present with tip in the vicinity of the upper margin of the SVC. Extending through this is a temporary pacer lead with tip projecting over the right ventricle. No pneumothorax or procedure early related complication. Mild enlargement of the cardiopericardial silhouette is present with indistinct pulmonary vasculature favoring pulmonary venous hypertension. There is also a moderate to large right pleural effusion. Upper lumbar posterolateral rod and pedicle screw fixator. IMPRESSION: 1. Right IJ introducer sheath with tip in the vicinity of the upper margin of the SVC. Extending through this is a temporary pacer lead with tip projecting over the right ventricle. No pneumothorax or procedure early related complication. 2. Moderate to large right pleural effusion. 3. Mild enlargement of the cardiopericardial silhouette with indistinct pulmonary vasculature favoring pulmonary venous hypertension. Electronically Signed   By: Gaylyn Rong M.D.   On: 01/13/2023 21:31   CARDIAC CATHETERIZATION  Result Date: 01/13/2023 1.  Successful temporary pacemaker placement from the right internal jugular approach with a threshold of 0.3 mA, output of 5 mA, and backup rate of 60 bpm. Recommendation: Monitoring in the ICU; obtain portable chest x-ray; the results  were reviewed with Dr. Lalla Brothers.   DG Chest Port 1 View  Result Date: 01/13/2023 CLINICAL DATA:  Dyspnea EXAM: PORTABLE CHEST 1 VIEW COMPARISON:  Previous studies including the examination of 10/23/2022 FINDINGS: Transverse diameter of heart is increased. Central pulmonary vessels are not prominent. Small to moderate bilateral pleural effusions are seen, more so on the right side. There is increase in interstitial markings in the parahilar regions. Evaluation of right lower lung field infiltrates is limited body fusion. Rest  of the lung fields show no focal infiltrates. IMPRESSION: Cardiomegaly. Central pulmonary vessels are more prominent. Increased interstitial markings are seen in parahilar regions suggesting CHF. Small to moderate bilateral pleural effusions, more so on the right side. Electronically Signed   By: Ernie Avena M.D.   On: 01/13/2023 14:43    Labs: BMET Recent Labs  Lab 01/13/23 1200 01/13/23 1226 01/13/23 2132 01/14/23 0031 01/14/23 0803 01/14/23 1122 01/14/23 1545 01/14/23 1945 01/14/23 2324 01/15/23 0400  NA 127* 127*  127* 128* 127*  --   --   --   --   --   --   K 5.0 5.0  5.1 6.1* 4.9 4.8 5.1 5.1 5.0 4.9 4.9  CL 89* 92* 90* 90*  --   --   --   --   --   --   CO2 22  --  21* 19*  --   --   --   --   --   --   GLUCOSE 119* 118* 87 270*  --   --   --   --   --   --   BUN 26* 26* 33* 36*  --   --   --   --   --   --   CREATININE 3.93* 4.10* 4.60* 4.99*  --   --   --   --   --   --   CALCIUM 10.3  --  9.2 8.9  --   --   --   --   --   --   PHOS 7.9*  --   --  9.3*  --   --   --   --   --   --    CBC Recent Labs  Lab 01/13/23 1200 01/13/23 1226 01/14/23 0031  WBC 5.7  --  6.1  HGB 11.9* 12.6  11.9* 10.4*  HCT 35.8* 37.0  35.0* 31.2*  MCV 102.9*  --  101.0*  PLT 99*  --  101*    Medications:     chlorhexidine  60 mL Topical Once   [MAR Hold] Chlorhexidine Gluconate Cloth  6 each Topical Daily   [MAR Hold] Chlorhexidine Gluconate Cloth  6 each Topical Q0600   gentamicin (GARAMYCIN) 80 mg in sodium chloride 0.9 % 500 mL irrigation  80 mg Irrigation On Call   [MAR Hold] hydrALAZINE  50 mg Oral TID   [MAR Hold] mirtazapine  15 mg Oral QHS      Anthony Sar, MD [facility] 01/15/2023, 8:11 AM

## 2023-01-15 NOTE — Progress Notes (Signed)
Pt receives out-pt HD at [facility] [place] on MWF 6:00 am chair time. Will assist as needed.   Olivia Canter Renal Navigator [phone]

## 2023-01-15 NOTE — Anesthesia Preprocedure Evaluation (Addendum)
Anesthesia Evaluation  Patient identified by MRN, date of birth, ID band Patient awake    Reviewed: Allergy & Precautions, NPO status , Patient's Chart, lab work & pertinent test results  History of Anesthesia Complications Negative for: history of anesthetic complications  Airway Mallampati: I   Neck ROM: Full    Dental no notable dental hx. (+) Dental Advisory Given   Pulmonary neg pulmonary ROS   Pulmonary exam normal        Cardiovascular hypertension, Pt. on medications and Pt. on home beta blockers +CHF  Normal cardiovascular exam+ dysrhythmias + pacemaker    Echo 12/02/21: EF 60-65%, no RWMA, g2dd, mild pulm HTN, no MR, trivial AR   Neuro/Psych negative neurological ROS     GI/Hepatic PUD,,,(+) Cirrhosis         Endo/Other  diabetesHypothyroidism    Renal/GU ESRFRenal disease  negative genitourinary   Musculoskeletal negative musculoskeletal ROS (+)    Abdominal   Peds  Hematology  (+) Blood dyscrasia, anemia   Anesthesia Other Findings   Reproductive/Obstetrics                             Anesthesia Physical Anesthesia Plan  ASA: 3  Anesthesia Plan: General   Post-op Pain Management: Tylenol PO (pre-op)*   Induction: Intravenous  PONV Risk Score and Plan: 2 and Treatment may vary due to age or medical condition, Midazolam, Ondansetron and Dexamethasone  Airway Management Planned: Oral ETT  Additional Equipment: None  Intra-op Plan:   Post-operative Plan: Extubation in OR  Informed Consent: I have reviewed the patients History and Physical, chart, labs and discussed the procedure including the risks, benefits and alternatives for the proposed anesthesia with the patient or authorized representative who has indicated his/her understanding and acceptance.     Dental advisory given  Plan Discussed with: Anesthesiologist, CRNA and Surgeon  Anesthesia Plan  Comments:         Anesthesia Quick Evaluation

## 2023-01-15 NOTE — Addendum Note (Signed)
Addendum  created 01/15/23 1155 by Heather Roberts, MD   Clinical Note Signed, SmartForm saved

## 2023-01-15 NOTE — Anesthesia Postprocedure Evaluation (Signed)
Anesthesia Post Note  Patient: Stacey Chambers  Procedure(s) Performed: PACEMAKER LEADLESS INSERTION     Patient location during evaluation: Cath Lab Anesthesia Type: General Level of consciousness: sedated Pain management: pain level controlled Vital Signs Assessment: post-procedure vital signs reviewed and stable Respiratory status: spontaneous breathing and respiratory function stable Cardiovascular status: stable Postop Assessment: no apparent nausea or vomiting Anesthetic complications: no  No notable events documented.  Last Vitals:  Vitals:   01/15/23 1140 01/15/23 1147  BP: 138/65 (!) 144/69  Pulse: 85 85  Resp: 14 14  Temp:  36.8 C  SpO2: 93% 93%    Last Pain:  Vitals:   01/15/23 1147  TempSrc: Temporal  PainSc:                  Cynthia Stainback DANIEL

## 2023-01-15 NOTE — Procedures (Signed)
HD Note:  Some information was entered later than the data was gathered due to patient care needs. The stated time with the data is accurate.  Patient treated at bedside  Alert and oriented. [name] speaking translator was utilized to ensure that the patient understood that we were going to do dialysis.  She responded that she did and did not have any questions.  Informed consent signed and in chart.   TX duration: 4 hours  Patient tolerated treatment well.   Patient remain  Access used: left upper arm fistula Access issues: None  Total UF removed: 3500 ml  Hand-off given to patient's nurse.   Ervin Rothbauer L. Dareen Piano, RN [facility] D[facility].

## 2023-01-15 NOTE — Progress Notes (Deleted)
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  Patient previously had a left brachiocephalic AV fistula on 12/23/2021 by myself.  Past Medical History:  Diagnosis Date   Anemia    Cirrhosis (HCC)    Diabetes mellitus without complication (HCC)    ESRD (end stage renal disease) on dialysis (HCC)    Hypertension    Type 2 diabetes mellitus (HCC)    Vision loss     Past Surgical History:  Procedure Laterality Date   AV FISTULA PLACEMENT Left 12/23/2021   Procedure: INSERTION OF LEFT ARM BRACHIOCEPHALIC ARTERIOVENOUS FISTULA;  Surgeon: Cephus Shelling, MD;  Location: [facility] OR;  Service: Vascular;  Laterality: Left;   BACK SURGERY     COLONOSCOPY     IR FLUORO GUIDE CV LINE RIGHT  12/16/2021   IR US GUIDE VASC ACCESS RIGHT  12/16/2021   UPPER GASTROINTESTINAL ENDOSCOPY      Family History  Problem Relation Age of Onset   Diabetes Brother    Colon cancer Neg Hx    Esophageal cancer Neg Hx    Stomach cancer Neg Hx    Pancreatic cancer Neg Hx    Inflammatory bowel disease Neg Hx    Liver disease Neg Hx    Rectal cancer Neg Hx    Colon polyps Neg Hx     SOCIAL HISTORY: Social History   Socioeconomic History   Marital status: Married    Spouse name: Not on file   Number of children: Not on file   Years of education: Not on file   Highest education level: Not on file  Occupational History   Not on file  Tobacco Use   Smoking status: Never   Smokeless tobacco: Never  Vaping Use   Vaping status: Never Used  Substance and Sexual Activity   Alcohol use: Never    Comment: once in awhile   Drug use: Not Currently    Types: Marijuana    Comment: Use of THC tea 1-2 times per  week   Sexual activity: Not on file  Other Topics Concern   Not on file  Social History Narrative   Not on file   Social Determinants of Health   Financial Resource Strain: Not on file  Food Insecurity: No Food Insecurity (10/26/2022)   Hunger Vital Sign    Worried About Running Out of Food in the Last Year: Never true    Ran Out of Food in the Last Year: Never true  Transportation Needs: No Transportation Needs (10/26/2022)   PRAPARE - Administrator, Civil Service (Medical): No    Lack of Transportation (Non-Medical): No  Physical Activity: Not on file  Stress: Not on file  Social Connections: Unknown (09/14/2022)   Received from [facility]   Social Network    Social Network: Not on file  Intimate Partner Violence: Not At Risk (10/26/2022)   Humiliation, Afraid, Rape, and Kick questionnaire    Fear of Current or Ex-Partner: No    Emotionally Abused: No    Physically Abused: No    Sexually Abused: No    No Known Allergies  No current facility-administered medications for this visit.   No current outpatient medications on  file.   Facility-Administered Medications Ordered in Other Visits  Medication Dose Route Frequency Provider Last Rate Last Admin   0.9 %  sodium chloride infusion   Intravenous Continuous Lanier Prude, MD       acetaminophen (TYLENOL) tablet 500 mg  500 mg Oral Q6H PRN Lorin Glass, MD   500 mg at 01/15/23 0012   atropine 1 MG/10ML injection 0.5 mg  0.5 mg Intravenous Q5 min PRN Simonne Martinet, NP   0.5 mg at 01/13/23 1323   ceFAZolin (ANCEF) IVPB 2g/100 mL premix  2 g Intravenous On Call Corrin Parker, PA-C       chlorhexidine (HIBICLENS) 4 % liquid 4 Application  60 mL Topical Once Corrin Parker, PA-C       Chlorhexidine Gluconate Cloth 2 % PADS 6 each  6 each Topical Daily Lynnell Catalan, MD   6 each at 01/14/23 1047   Chlorhexidine Gluconate Cloth 2 % PADS 6 each  6 each Topical Q0600 Delano Metz, MD        docusate sodium (COLACE) capsule 100 mg  100 mg Oral BID PRN Simonne Martinet, NP       DOPamine (INTROPIN) 800 mg in dextrose 5 % 250 mL (3.2 mg/mL) infusion  5-10 mcg/kg/min Intravenous Titrated Simonne Martinet, NP 5.93 mL/hr at 01/13/23 1356 5 mcg/kg/min at 01/13/23 1356   gentamicin (GARAMYCIN) 80 mg in sodium chloride 0.9 % 500 mL irrigation  80 mg Irrigation On Call Marjie Skiff E, PA-C       hydrALAZINE (APRESOLINE) tablet 50 mg  50 mg Oral TID Josiah Lobo T, MD   50 mg at 01/15/23 0604   melatonin tablet 3 mg  3 mg Oral QHS PRN Larinda Buttery, MD   3 mg at 01/14/23 1819   mirtazapine (REMERON) tablet 15 mg  15 mg Oral QHS Lorin Glass, MD   15 mg at 01/14/23 2104   morphine (PF) 2 MG/ML injection 1 mg  1 mg Intravenous Q2H PRN Simonne Martinet, NP   1 mg at 01/14/23 1648   Oral care mouth rinse  15 mL Mouth Rinse PRN Agarwala, Daleen Bo, MD       polyethylene glycol (MIRALAX / GLYCOLAX) packet 17 g  17 g Oral Daily PRN Simonne Martinet, NP       traMADol Janean Sark) tablet 50 mg  50 mg Oral Q12H PRN Lorin Glass, MD   50 mg at 01/14/23 1531    REVIEW OF SYSTEMS:  [X]  denotes positive finding, [ ]  denotes negative finding Cardiac  Comments:  Chest pain or chest pressure: ***   Shortness of breath upon exertion:    Short of breath when lying flat:    Irregular heart rhythm:        Vascular    Pain in calf, thigh, or hip brought on by ambulation:    Pain in feet at night that wakes you up from your sleep:     Blood clot in your veins:    Leg swelling:         Pulmonary    Oxygen at home:    Productive cough:     Wheezing:         Neurologic    Sudden weakness in arms or legs:     Sudden numbness in arms or legs:     Sudden onset of difficulty speaking or slurred speech:    Temporary loss of vision in one eye:  Problems with dizziness:         Gastrointestinal    Blood in stool:     Vomited blood:         Genitourinary    Burning when urinating:     Blood  in urine:        Psychiatric    Major depression:         Hematologic    Bleeding problems:    Problems with blood clotting too easily:        Skin    Rashes or ulcers:        Constitutional    Fever or chills:      PHYSICAL EXAM: There were no vitals filed for this visit.  GENERAL: The patient is a well-nourished female, in no acute distress. The vital signs are documented above. CARDIAC: There is a regular rate and rhythm.  VASCULAR: *** PULMONARY: There is good air exchange bilaterally without wheezing or rales. ABDOMEN: Soft and non-tender with normal pitched bowel sounds.  MUSCULOSKELETAL: There are no major deformities or cyanosis. NEUROLOGIC: No focal weakness or paresthesias are detected. SKIN: There are no ulcers or rashes noted. PSYCHIATRIC: The patient has a normal affect.  DATA:   ***  Assessment/Plan:  ***   Cephus Shelling, MD [facility] Office: [phone]

## 2023-01-15 NOTE — Transfer of Care (Signed)
Immediate Anesthesia Transfer of Care Note  Patient: Stacey Chambers  Procedure(s) Performed: PACEMAKER LEADLESS INSERTION  Patient Location: Cath Lab  Anesthesia Type:General  Level of Consciousness: awake and patient cooperative  Airway & Oxygen Therapy: Patient Spontanous Breathing and Patient connected to face mask oxygen  Post-op Assessment: Report given to RN and Post -op Vital signs reviewed and stable  Post vital signs: Reviewed and stable  Last Vitals:  Vitals Value Taken Time  BP 146/55 01/15/23 1120  Temp 36.8 C 01/15/23 1120  Pulse 86 01/15/23 1120  Resp 16 01/15/23 1120  SpO2 100 % 01/15/23 1120  Vitals shown include unfiled device data.  Last Pain:  Vitals:   01/15/23 1120  TempSrc: Temporal  PainSc: 0-No pain         Complications: No notable events documented.

## 2023-01-15 NOTE — Progress Notes (Signed)
NAME:  Stacey Chambers, MRN:  [id number], DOB:  04/08/1951, LOS: 2 ADMISSION DATE:  01/13/2023, CONSULTATION DATE:  7/13 REFERRING MD:  Rhunette Croft, CHIEF COMPLAINT:  CHB   History of Present Illness:  72 year old [name]-speaking female who presented to the emergency room with chief complaint of acute shortness of breath and presyncope.  Because of this she called emergency response, she was found to be in complete heart block.  Was given IV atropine with little effect and subsequently started on dopamine with improvement in heart rate, shortness of breath, and blood pressure.  She was seen by cardiology with plan to bring her to cath lab and place temporary pacer.  With plan to ultimately be seen by EP and likely require permanent pacemaker  Pertinent  Medical History  End-stage renal disease last dialysis 7/12 (Monday/Wednesday/Friday schedule) history of alcoholic related cirrhosis, hypertension, diabetes, anemia, prior admission for complete heart block in April of this year felt secondary to hyperkalemia and metabolic derangements.  Chronic back pain.    Significant Hospital Events: Including procedures, antibiotic start and stop dates in addition to other pertinent events   Admitted with complete heart block placed on dopamine with improved symptoms seen by cardiology with plan to place intravenous pacemaker 7/15 leadless pacemaker inserted.   Interim History / Subjective:  S/p PPM placement No complaints No CP, breathing comfortably.   Objective   Blood pressure (!) 144/69, pulse 85, temperature 98.2 F (36.8 C), temperature source Temporal, resp. rate 14, height 5\' 1"  (1.549 m), weight 65.1 kg, SpO2 93%.        Intake/Output Summary (Last 24 hours) at 01/15/2023 1233 Last data filed at 01/15/2023 1103 Gross per 24 hour  Intake 640 ml  Output 520 ml  Net 120 ml   Filed Weights   01/14/23 0500 01/15/23 0500 01/15/23 0802  Weight: 66.7 kg 65.1 kg 65.1 kg     Examination: General:  adult female in NAD Neuro:  Alert, oriented, non-focal HEENT:  [place]/AT, PERRL, no JVD Cardiovascular:  RRR, no MRG Lungs:  Clear bilateral breath sounds Abdomen:  Soft, NT, ND Musculoskeletal:  No acute deformity or edema.  Skin:  Intact, MMM  Patient Lines/Drains/Airways Status     Active Line/Drains/Airways     Name Placement date Placement time Site Days   Peripheral IV 01/13/23 18 G Right Antecubital 01/13/23  1203  Antecubital  1   Peripheral IV 01/13/23 18 G Right Hand 01/13/23  1204  Hand  1   Fistula / Graft Left Upper arm Arteriovenous fistula 12/23/21  0816  Upper arm  387   Sheath 01/13/23 Right Internal Jugular 01/13/23  1935  Internal Jugular  1   External Urinary Catheter 01/13/23  2100  --  1             Resolved Hospital Problem list     Assessment & Plan:  Complete heart block with cardiogenic shock and resultant lactic acidosis: resolved after TVP; now hypertensive.  - leadless pacemaker implanted 7/15 - Appreciate EP - Remove cordis - Telemetry monitoring  End-stage renal disease (Monday Wednesday Friday schedule) last dialysis on 7/12 Volume overloaded, Fluid and electrolyte imbalance: hyponatremia, Hyperkalemia. Improved. - HD today per nephrology - following renal panel and I&O  Hypertension -hydralazine - add back home amlodipine   H/o cirrhosis, chronic thrombocytopenia related- compensated at present - supportive care  AOCD Chronic back pain- - using tylenol PRN for now; limit to 2g per day - at bedtime trazodone  -  ultram for breakthrough pain  Tentatively will plan for discharge tomorrow  Best Practice (right click and "Reselect all SmartList Selections" daily)   Diet/type: renal/carb mod DVT prophylaxis: SCD GI prophylaxis: N/A Lines: N/A Foley:  N/A Code Status:  full code Last date of multidisciplinary goals of care discussion [pending]   Joneen Roach, AGACNP-BC [facility]  See Amion for personal pager PCCM on call pager [phone] until 7pm. Please call Elink 7p-7a. [phone]  01/15/2023 12:44 PM

## 2023-01-15 NOTE — Anesthesia Procedure Notes (Signed)
Procedure Name: Intubation Date/Time: 01/15/2023 10:12 AM  Performed by: Audie Pinto, CRNAPre-anesthesia Checklist: Patient identified, Emergency Drugs available, Suction available and Patient being monitored Patient Re-evaluated:Patient Re-evaluated prior to induction Oxygen Delivery Method: Circle system utilized Preoxygenation: Pre-oxygenation with 100% oxygen Induction Type: IV induction Ventilation: Mask ventilation without difficulty Laryngoscope Size: Mac and 4 Grade View: Grade I Tube type: Oral Tube size: 7.0 mm Number of attempts: 1 Airway Equipment and Method: Stylet and Oral airway Placement Confirmation: ETT inserted through vocal cords under direct vision, positive ETCO2 and breath sounds checked- equal and bilateral Secured at: 21 cm Tube secured with: Tape Dental Injury: Teeth and Oropharynx as per pre-operative assessment

## 2023-01-16 ENCOUNTER — Inpatient Hospital Stay (HOSPITAL_COMMUNITY): Payer: 59

## 2023-01-16 ENCOUNTER — Ambulatory Visit: Payer: 59 | Admitting: Vascular Surgery

## 2023-01-16 ENCOUNTER — Encounter (HOSPITAL_COMMUNITY): Payer: Self-pay | Admitting: Cardiology

## 2023-01-16 DIAGNOSIS — I442 Atrioventricular block, complete: Secondary | ICD-10-CM | POA: Diagnosis not present

## 2023-01-16 LAB — GLUCOSE, CAPILLARY
Glucose-Capillary: 110 mg/dL — ABNORMAL HIGH (ref 70–99)
Glucose-Capillary: 170 mg/dL — ABNORMAL HIGH (ref 70–99)
Glucose-Capillary: 90 mg/dL (ref 70–99)
Glucose-Capillary: 119 mg/dL — ABNORMAL HIGH (ref 70–99)
Glucose-Capillary: 123 mg/dL — ABNORMAL HIGH (ref 70–99)

## 2023-01-16 LAB — BASIC METABOLIC PANEL
Anion gap: 14 (ref 5–15)
BUN: 53 mg/dL — ABNORMAL HIGH (ref 8–23)
CO2: 24 mmol/L (ref 22–32)
Calcium: 8.9 mg/dL (ref 8.9–10.3)
Chloride: 94 mmol/L — ABNORMAL LOW (ref 98–111)
Creatinine, Ser: 5.94 mg/dL — ABNORMAL HIGH (ref 0.44–1.00)
GFR, Estimated: 7 mL/min — ABNORMAL LOW (ref >60)
Glucose, Bld: 130 mg/dL — ABNORMAL HIGH (ref 70–99)
Potassium: 5.1 mmol/L (ref 3.5–5.1)
Sodium: 132 mmol/L — ABNORMAL LOW (ref 135–145)

## 2023-01-16 LAB — CBC
HCT: 29.3 % — ABNORMAL LOW (ref 36.0–46.0)
Hemoglobin: 9.6 g/dL — ABNORMAL LOW (ref 12.0–15.0)
MCH: 34.2 pg — ABNORMAL HIGH (ref 26.0–34.0)
MCHC: 32.8 g/dL (ref 30.0–36.0)
MCV: 104.3 fL — ABNORMAL HIGH (ref 80.0–100.0)
Platelets: 94 10*3/uL — ABNORMAL LOW (ref 150–400)
RBC: 2.81 MIL/uL — ABNORMAL LOW (ref 3.87–5.11)
RDW: 14 % (ref 11.5–15.5)
WBC: 5.8 10*3/uL (ref 4.0–10.5)
nRBC: 0 % (ref 0.0–0.2)

## 2023-01-16 LAB — MAGNESIUM: Magnesium: 2.6 mg/dL — ABNORMAL HIGH (ref 1.7–2.4)

## 2023-01-16 LAB — HEPATITIS B SURFACE ANTIBODY, QUANTITATIVE: Hep B S AB Quant (Post): 604 m[IU]/mL

## 2023-01-16 LAB — HEMOGLOBIN A1C
Hgb A1c MFr Bld: 4.4 % — ABNORMAL LOW (ref 4.8–5.6)
Mean Plasma Glucose: 79.58 mg/dL

## 2023-01-16 LAB — PHOSPHORUS: Phosphorus: 6.3 mg/dL — ABNORMAL HIGH (ref 2.5–4.6)

## 2023-01-16 LAB — POTASSIUM: Potassium: 4.7 mmol/L (ref 3.5–5.1)

## 2023-01-16 MED ORDER — HYDROCORTISONE 1 % EX LOTN
TOPICAL_LOTION | Freq: Two times a day (BID) | CUTANEOUS | Status: DC | PRN
  Filled 2023-01-16: qty 118

## 2023-01-16 MED ORDER — AMLODIPINE BESYLATE 5 MG PO TABS
5.0000 mg | ORAL_TABLET | Freq: Every day | ORAL | Status: DC
  Administered 2023-01-16 – 2023-01-17 (×2): 5 mg via ORAL
  Filled 2023-01-16 (×2): qty 1

## 2023-01-16 MED ORDER — HYDROCORTISONE 1 % EX CREA
TOPICAL_CREAM | Freq: Two times a day (BID) | CUTANEOUS | Status: DC | PRN
  Filled 2023-01-16: qty 28

## 2023-01-16 MED ORDER — INSULIN ASPART 100 UNIT/ML IJ SOLN: 0.0000 [IU] | Freq: Three times a day (TID) | INTRAMUSCULAR | Status: DC

## 2023-01-16 NOTE — Discharge Instructions (Addendum)
Despus del implante de su marcapasos   Tienes un marcapasos de [employer]tronic   No levante ningn objeto que pese ms de 5 libras (2.2 kg) o que supere el lmite de peso que le hayan indicado, Teacher, adult education que el mdico le diga que puede [place].  No conduzca durante 4 das o hasta que su proveedor de Insurance risk surveyor indique que es seguro [place].  Monitoree el sitio de su marcapasos para ver si tiene enrojecimiento, hinchazn y drenaje. Llame a la [employer] colocaron el dispositivo al [phone] si tiene los sntomas anteriores, o fiebre/resfriado.   Puede ducharse 24 a 48 horas despus del procedimiento o como se lo haya indicado el mdico. Lave suavemente el lugar de la incisin con agua y jabn comn. Seque bien el rea con una toalla limpia dando golpecitos. No se frote el lugar. Esto puede ocasionarle una hemorragia.  No tome baos de inmersin, no nade ni use el jacuzzi hasta que el mdico lo autorice  Es posible su marcapasos es compatible con Lobbyist de IRM (Imagen por Resonancia Magntica). Pregunte en su prxima cita.  La vigilancia remota se [place] para monitorear el [address] desde su casa. Esta vigilancia se programa cada [address] en nuestra oficina. Esto nos permite vigilar el funcionamiento de su dispositivo para asegurarnos de que funciona correctamente. Usted ver a su electrofisilogo anualmente (si es necesario, le ver ms a menudo).    Qu puedo esperar despus del procedimiento? Despus del procedimiento, es frecuente tener moretones y Financial risk analyst a la palpacin en el lugar de la incisin. Esto suele desaparecer en el trmino de 1 o 2 semanas.  Cuidados del Environmental consultant de la incisin  Siga las instrucciones del mdico acerca cmo cuidar el lugar de la incisin. Asegrese de hacer lo siguiente: Cambie o retire Office manager se lo haya indicado el mdico. No tome baos de inmersin, no nade ni use el jacuzzi hasta que el mdico lo autorice. Puede ducharse 24 a 48  horas despus del procedimiento o como se lo haya indicado el mdico. Lave suavemente el lugar de la incisin con agua y jabn comn. Seque bien el rea con una toalla limpia dando golpecitos. No se frote el lugar. Esto puede ocasionarle una hemorragia. No se aplique talcos ni lociones en el lugar. Mantenga Immunologist limpio y Dealer. Haematologist femoral todos los das para detectar signos de infeccin. Est atento a los siguientes signos: Enrojecimiento, Optician, dispensing. Lquido o sangre. Calor. Pus o mal olor.  Solicite ayuda inmediatamente si: La zona de la incisin se hincha muy rpido. La zona de la incisin sangra y el sangrado no se detiene cuando usted ejerce presin constante en la zona. La pierna o el pie se le ponen plidos, fros o siente hormigueo o adormecimiento. Estos sntomas pueden representar un problema grave que constituye Radio broadcast assistant. No espere a ver si los sntomas desaparecen. Solicite atencin mdica de inmediato. Comunquese con el servicio de emergencias de su localidad (911 en los [place]). No conduzca por sus propios medios [employer]. Resumen Despus del procedimiento, es frecuente tener moretones que suelen y sensibilidad a la palpacin que desaparecen en el trmino de 1 a 2 semanas. Controle Immunologist femoral todos los das para detectar signos de infeccin. No levante ningn objeto que pese ms de 5 libras (2.2 kg) o que supere el lmite de peso que le hayan indicado, Teacher, adult education que el mdico le diga que puede [place]. Obtenga ayuda de inmediato  si la zona de la incisin se hincha muy rpido, si tiene sangrado en la zona de la incisin, que no se detiene o si la pierna o el pie se tornan plidos, fros o se adormecen.  Esta informacin no tiene Theme park manager el consejo del mdico. Asegrese de hacerle al mdico cualquier pregunta que tenga.

## 2023-01-16 NOTE — Progress Notes (Signed)
NAME:  Stacey Chambers, MRN:  [id number], DOB:  06/11/1951, LOS: 3 ADMISSION DATE:  01/13/2023, CONSULTATION DATE:  7/13 REFERRING MD:  Rhunette Croft, CHIEF COMPLAINT:  CHB   History of Present Illness:  72 year old [name]-speaking female who presented to the emergency room with chief complaint of acute shortness of breath and presyncope.  Because of this she called emergency response, she was found to be in complete heart block.  Was given IV atropine with little effect and subsequently started on dopamine with improvement in heart rate, shortness of breath, and blood pressure.  She was seen by cardiology with plan to bring her to cath lab and place temporary pacer.  With plan to ultimately be seen by EP and likely require permanent pacemaker  Pertinent  Medical History  End-stage renal disease last dialysis 7/12 (Monday/Wednesday/Friday schedule) history of alcoholic related cirrhosis, hypertension, diabetes, anemia, prior admission for complete heart block in April of this year felt secondary to hyperkalemia and metabolic derangements.  Chronic back pain.    Significant Hospital Events: Including procedures, antibiotic start and stop dates in addition to other pertinent events   Admitted with complete heart block placed on dopamine with improved symptoms seen by cardiology with plan to place intravenous pacemaker 7/15 leadless pacemaker inserted.   Interim History / Subjective:  Doing well this morning Still some tenderness at procedure sites, otherwise no complaints Some dizziness with ambulation this morning  Objective   Blood pressure (!) 151/77, pulse (!) 113, temperature 98 F (36.7 C), temperature source Oral, resp. rate 18, height 5\' 1"  (1.549 m), weight 61.1 kg, SpO2 100%.        Intake/Output Summary (Last 24 hours) at 01/16/2023 0900 Last data filed at 01/16/2023 0700 Gross per 24 hour  Intake 960 ml  Output 4120 ml  Net -3160 ml   Filed Weights   01/15/23 0500  01/15/23 0802 01/16/23 0400  Weight: 65.1 kg 65.1 kg 61.1 kg    Examination: General:  adult female in NAD Neuro:  Alert, oriented, non-focal HEENT:  [place]/AT, PERRL, no JVD Cardiovascular:  RRR, no MRG Lungs:  Clear, no distress Abdomen:  Soft, NT, ND Musculoskeletal:  No acute deformity or edema.  Skin:  Intact, MMM   Resolved Hospital Problem list     Assessment & Plan:  Complete heart block with cardiogenic shock and resultant lactic acidosis: resolved after TVP; now hypertensive.  - Leadless pacemaker implanted 7/15 - Appreciate EP - Telemetry monitoring - Transfer to tele floor  End-stage renal disease: (Monday Wednesday Friday schedule) last dialysis on 7/12 Volume overloaded: Fluid and electrolyte imbalance: hyponatremia, Hyperkalemia. Improved. - HD MWF, appreciate nephrology - following renal panel and I&O  Hypertension - hydralazine, amlodipine. Increase amlodipine to 5mg   H/o cirrhosis, chronic thrombocytopenia related- compensated at present - supportive care  AOCD Chronic back pain- - using tylenol PRN for now; limit to 2g per day - at bedtime trazodone  - ultram for breakthrough pain  Not quite ready for discharge. Dizzy and unstable on her feet. Will consult PT for dispo recommendations.   Best Practice (right click and "Reselect all SmartList Selections" daily)   Diet/type: renal/carb mod DVT prophylaxis: SCD GI prophylaxis: N/A Lines: N/A Foley:  N/A Code Status:  full code Last date of multidisciplinary goals of care discussion [pending]   Joneen Roach, AGACNP-BC [facility]  See Amion for personal pager PCCM on call pager [phone] until 7pm. Please call Elink 7p-7a. [phone]  01/16/2023 9:00 AM

## 2023-01-16 NOTE — Progress Notes (Signed)
[facility] Progress Note    Assessment/ Plan:   Complete heart block - resolved after TVP placement. S/p leadless PM 7/15 ESRD - on HD MWF sched HTN/ volume - UF as tolerated with HD Anemia esrd - Hb 10-12 here, no esa needs. Cont on IV Fe q 2wks w/ next date being 7/19 if still here.   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 Cont binder, do HD. Cont IV vdra Cirrhosis- per primary Chronic back pain-per primary   OP HD: SW MWF  3.5h   400/1.5    61.5kg  2/2 bath  AVF  Heparin none - last OP HD was 7/12, post wt 62.1kg - hectorol 2 mcg IV three times per week - venofer 100mg  q 2 wks, last 7/05, due 7/19  Subjective:   Patient seen and examined this AM in ICU. Tolerated HD yesterday with net UF 3.5L. leg feels shaky, she is suspecting this is residual from anesthesia   Objective:   BP (!) 151/77   Pulse (!) 113   Temp 98 F (36.7 C) (Oral)   Resp 18   Ht 5\' 1"  (1.549 m)   Wt 61.1 kg   SpO2 100%   BMI 25.45 kg/m   Intake/Output Summary (Last 24 hours) at 01/16/2023 0865 Last data filed at 01/16/2023 0700 Gross per 24 hour  Intake 960 ml  Output 4120 ml  Net -3160 ml   Weight change: 0 kg  Physical Exam: Gen: NAD, laying flat in bed CVS: RRR, TVP in place Resp: CTA B/L Abd: soft Ext: no sig edema b/l Les Neuro: awake, alert Dialysis access: LUE AVF +b/t  Imaging: DG Chest 2 View  Result Date: 01/16/2023 CLINICAL DATA:  Status post pacemaker. EXAM: CHEST - 2 VIEW COMPARISON:  None Available. FINDINGS: The heart is enlarged and the mediastinal contour is within normal limits. Pulmonary vasculature is mildly distended. There is a small to moderate right pleural effusion and a trace left pleural effusion with atelectasis, edema, or infiltrate. No pneumothorax is seen. Spinal fusion hardware is noted over the lumbar spine. A mechanical device is present over the left chest. IMPRESSION: 1. Cardiomegaly with pulmonary vascular congestion. 2. Trace pleural  effusion on the left and small to moderate pleural effusion on the right with atelectasis, edema, or infiltrate. Electronically Signed   By: Thornell Sartorius M.D.   On: 01/16/2023 04:51   EP PPM/ICD IMPLANT  Result Date: 01/15/2023 CONCLUSIONS: 1.Successful implantation of a Medtronic Micra leadless pacemaker. 2.  Successful removal of the temporary pacemaker via the right internal jugular vein. 3. No early apparent complications.    Labs: BMET Recent Labs  Lab 01/13/23 1200 01/13/23 1226 01/13/23 2132 01/14/23 0031 01/14/23 0803 01/15/23 0400 01/15/23 0816 01/15/23 1249 01/15/23 1638 01/15/23 2034 01/16/23 0122 01/16/23 0740  NA 127* 127*  127* 128* 127*  --   --  130*  --   --   --  132*  --   K 5.0 5.0  5.1 6.1* 4.9   < > 4.9 5.1 5.7* 4.5 4.7 5.1 4.7  CL 89* 92* 90* 90*  --   --  98  --   --   --  94*  --   CO2 22  --  21* 19*  --   --   --   --   --   --  24  --   GLUCOSE 119* 118* 87 270*  --   --  118*  --   --   --  130*  --   BUN 26* 26* 33* 36*  --   --  53*  --   --   --  53*  --   CREATININE 3.93* 4.10* 4.60* 4.99*  --   --  8.10*  --   --   --  5.94*  --   CALCIUM 10.3  --  9.2 8.9  --   --   --   --   --   --  8.9  --   PHOS 7.9*  --   --  9.3*  --   --   --   --   --   --  6.3*  --    < > = values in this interval not displayed.   CBC Recent Labs  Lab 01/13/23 1200 01/13/23 1226 01/14/23 0031 01/15/23 0816 01/16/23 0122  WBC 5.7  --  6.1  --  5.8  HGB 11.9* 12.6  11.9* 10.4* 10.2* 9.6*  HCT 35.8* 37.0  35.0* 31.2* 30.0* 29.3*  MCV 102.9*  --  101.0*  --  104.3*  PLT 99*  --  101*  --  94*    Medications:     amLODipine  2.5 mg Oral Daily   Chlorhexidine Gluconate Cloth  6 each Topical Daily   Chlorhexidine Gluconate Cloth  6 each Topical Q0600   hydrALAZINE  50 mg Oral TID   mirtazapine  15 mg Oral QHS   sodium chloride flush  3 mL Intravenous Q12H      Anthony Sar, MD [facility] 01/16/2023, 8:32 AM

## 2023-01-16 NOTE — Progress Notes (Signed)
Mild CO2 retention noted on pre and postextubation ABG.  Patient is asymptomatic.  Will continue to monitor and consider use of noninvasive ventilation should she get somnolent.  Do not see any role for any additional ABGs unless change in mental status.  Myrla Halsted, MD, PCCM

## 2023-01-16 NOTE — Progress Notes (Addendum)
Rounding Note    Patient Name: Stacey Chambers Date of Encounter: 01/16/2023  [facility] Cardiologist: Maisie Fus, MD   Subjective   Finally slept last night, she has some chest wall tenderness, not new, no CP, SOB otherwise Very orthostatic this AM  Inpatient Medications    Scheduled Meds:  amLODipine  5 mg Oral Daily   Chlorhexidine Gluconate Cloth  6 each Topical Daily   Chlorhexidine Gluconate Cloth  6 each Topical Q0600   hydrALAZINE  50 mg Oral TID   mirtazapine  15 mg Oral QHS   sodium chloride flush  3 mL Intravenous Q12H   Continuous Infusions:  sodium chloride     PRN Meds: sodium chloride, acetaminophen, atropine, docusate sodium, melatonin, ondansetron (ZOFRAN) IV, mouth rinse, polyethylene glycol, sodium chloride flush, traMADol, traZODone   Vital Signs    Vitals:   01/16/23 0700 01/16/23 0730 01/16/23 0800 01/16/23 0900  BP: (!) 151/77   (!) 131/59  Pulse: (!) 113  86 80  Resp: 18  15 19   Temp:  98 F (36.7 C)    TempSrc:  Oral    SpO2: 100%  97% 96%  Weight:      Height:        Intake/Output Summary (Last 24 hours) at 01/16/2023 1049 Last data filed at 01/16/2023 0700 Gross per 24 hour  Intake 960 ml  Output 3920 ml  Net -2960 ml      01/16/2023    4:00 AM 01/15/2023    8:02 AM 01/15/2023    5:00 AM  Last 3 Weights  Weight (lbs) 134 lb 11.2 oz 143 lb 8.3 oz 143 lb 8.3 oz  Weight (kg) 61.1 kg 65.1 kg 65.1 kg      Telemetry    SR, no pacing noted  - Personally Reviewed  ECG    SR, RBBB - Personally Reviewed  Physical Exam   GEN: No acute distress.   Neck: No JVD, R internal jugular site is stable, no bleeding, hematoma Cardiac: RRR, no murmurs, rubs, or gallops.  Respiratory: CTA. GI: Soft, nontender, non-distended  MS: No edema; No deformity. Neuro:  Nonfocal  Psych: Normal affect  Skin: no rashes  R groin site is stable, no bleeding, hematoma, bruit  Labs    High Sensitivity Troponin:   Recent  Labs  Lab 01/13/23 1200 01/13/23 1341  TROPONINIHS 15 17     Chemistry Recent Labs  Lab 01/13/23 1200 01/13/23 1226 01/13/23 2132 01/14/23 0031 01/14/23 0803 01/15/23 0816 01/15/23 1249 01/15/23 2034 01/16/23 0122 01/16/23 0740  NA 127*   < > 128* 127*  --  130*  --   --  132*  --   K 5.0   < > 6.1* 4.9   < > 5.1   < > 4.7 5.1 4.7  CL 89*   < > 90* 90*  --  98  --   --  94*  --   CO2 22  --  21* 19*  --   --   --   --  24  --   GLUCOSE 119*   < > 87 270*  --  118*  --   --  130*  --   BUN 26*   < > 33* 36*  --  53*  --   --  53*  --   CREATININE 3.93*   < > 4.60* 4.99*  --  8.10*  --   --  5.94*  --  CALCIUM 10.3  --  9.2 8.9  --   --   --   --  8.9  --   MG 2.6*  --  2.7* 2.7*  --   --   --   --  2.6*  --   PROT 7.9  --   --  7.4  --   --   --   --   --   --   ALBUMIN 3.3*  --   --  3.0*  --   --   --   --   --   --   AST 33  --   --  36  --   --   --   --   --   --   ALT 19  --   --  18  --   --   --   --   --   --   ALKPHOS 116  --   --  89  --   --   --   --   --   --   BILITOT 1.0  --   --  1.3*  --   --   --   --   --   --   GFRNONAA 12*  --  10* 9*  --   --   --   --  7*  --   ANIONGAP 16*  --  17* 18*  --   --   --   --  14  --    < > = values in this interval not displayed.    Lipids No results for input(s): "CHOL", "TRIG", "HDL", "LABVLDL", "LDLCALC", "CHOLHDL" in the last 168 hours.  Hematology Recent Labs  Lab 01/13/23 1200 01/13/23 1226 01/14/23 0031 01/15/23 0816 01/16/23 0122  WBC 5.7  --  6.1  --  5.8  RBC 3.48*  --  3.09*  --  2.81*  HGB 11.9*   < > 10.4* 10.2* 9.6*  HCT 35.8*   < > 31.2* 30.0* 29.3*  MCV 102.9*  --  101.0*  --  104.3*  MCH 34.2*  --  33.7  --  34.2*  MCHC 33.2  --  33.3  --  32.8  RDW 14.0  --  14.1  --  14.0  PLT 99*  --  101*  --  94*   < > = values in this interval not displayed.   Thyroid No results for input(s): "TSH", "FREET4" in the last 168 hours.  BNP Recent Labs  Lab 01/13/23 2132 01/14/23 0031  BNP 1,581.9*  2,087.1*    DDimer No results for input(s): "DDIMER" in the last 168 hours.   Radiology    DG Chest Port 1 View Result Date: 01/14/2023 CLINICAL DATA:  Pulmonary edema. EXAM: PORTABLE CHEST 1 VIEW COMPARISON:  Chest x-ray from yesterday. FINDINGS: Unchanged temporary pacing wire in the right ventricle. Stable cardiomegaly. Unchanged diffuse interstitial thickening with bibasilar atelectasis and small to moderate right pleural effusion. No pneumothorax. No acute osseous abnormality. IMPRESSION: 1. Unchanged congestive heart failure. Electronically Signed   By: Obie Dredge M.D.   On: 01/14/2023 09:02      Cardiac Studies    09/16/22: TTE 1. Left ventricular ejection fraction, by estimation, is 60 to 65%. The  left ventricle has normal function. The left ventricle has no regional  wall motion abnormalities. There is mild left ventricular hypertrophy.  Left ventricular diastolic parameters  are indeterminate.   2. Right ventricular  systolic function is normal. The right ventricular  size is normal. There is mildly elevated pulmonary artery systolic  pressure. The estimated right ventricular systolic pressure is 41.9 mmHg.   3. Left atrial size was moderately dilated.   4. A small pericardial effusion is present. The pericardial effusion is  circumferential. There is no evidence of cardiac tamponade.   5. The mitral valve is normal in structure. Mild mitral valve  regurgitation. No evidence of mitral stenosis.   6. Tricuspid valve regurgitation is moderate.   7. The aortic valve is calcified. There is mild calcification of the  aortic valve. There is mild thickening of the aortic valve. Aortic valve  regurgitation is not visualized. Mild aortic valve stenosis. Aortic valve  area, by VTI measures 2.07 cm.  Aortic valve mean gradient measures 10.0 mmHg. Aortic valve Vmax measures  2.16 m/s.   8. The inferior vena cava is normal in size with <50% respiratory  variability, suggesting  right atrial pressure of 8 mmHg.   Comparison(s): No significant change from prior study. Prior images  reviewed side by side.   Patient Profile     72 y.o. female w/PMHx of ESFR on HD, DM, HTN, ETOH/cirrosis, chronic IDA/thrombocytopenia, RBBB admitted with symptomatic bradycardia/CHB  Temp wire placed 01/13/23  Assessment & Plan    Today's visit is done with Video interpretor : Amil Amen # [id number]  CHB Baseline RBBB No reversible causes Recurrent CHB felt secondary tp progressive conduction system disease  Today's visit done with video translator Liliana # [phone] S/p PPM (leadless) implant yesterday R groin site is soft, minimal tenderness, no hematoma, bleeding Device check this morning with good measurements CXR with stable pacer position Activity restrictions reviewed with the patient EP follow up is in place  She mentions no transportation out patient, I have consulted TOC here to see her and provide any assistance information with that for her wound check/follow up appointments She has transportation for dialysis that she thinks is done via the center.   Continue as per IM/attending team and nephrology  HTN DM ESRF Cirrhosis Chronic anemia Thrombocytopenia Orthostatic dizziness this am        Planned for PT, transfer to tele and probably discharge to home tomorrow    Dr. Elberta Fortis has seen the p[atient this AM OK to discharge from an EP perspective when ready medically otherwise We Niley Helbig sign off though remain available, please recall if needed   For questions or updates, please contact [facility] Please consult [email or web address] for contact info under        Signed, Sheilah Pigeon, PA-C  01/16/2023, 10:49 AM    I have seen and examined this patient with Francis Dowse.  Agree with above, note added to reflect my findings.  On exam, RRR, no murmurs, lungs clear.  She is now status post Medtronic Micra for intermittent heart block.  Device functioning  appropriately.  Chest x-ray and interrogation without issue.  Plan for discharge today with follow-up in device clinic.  Estella Malatesta M. Abbegale Stehle MD 01/16/2023 5:03 PM

## 2023-01-16 NOTE — Evaluation (Addendum)
Occupational Therapy Evaluation Patient Details Name: Stacey Chambers MRN: [id number] DOB: 09/10/1950 Today's Date: 01/16/2023   History of Present Illness 72 year old [name]-speaking female who presented to the emergency room with chief complaint of acute shortness of breath and presyncope.  PMH includes: T2DM, ESRD, back surgery 2021. S/p PPM placement 7/15.   Clinical Impression   Patient admitted for the diagnosis above.  PTA she lives at her home, and has a renter that lives with her that assists with socks, iADL and community mobility.  Currently she is needing generalized Min A for ADL completion and in room mobility.  Biggest complaint is persistent dizziness, on orthostatic hypotension noted, SBP raised with each position change.  OT is indicated in the acute setting with no post acute OT anticipated.  Deficits are lower extremity weakness leading to poor dynamic balance.  PMM precautions reviewed, patient will need continued reinforcement.  She has a tendency to reach out for assist with her L UE.       Recommendations for follow up therapy are one component of a multi-disciplinary discharge planning process, led by the attending physician.  Recommendations may be updated based on patient status, additional functional criteria and insurance authorization.   Assistance Recommended at Discharge Intermittent Supervision/Assistance  Patient can return home with the following Assist for transportation;Assistance with cooking/housework;A little help with bathing/dressing/bathroom;A little help with walking and/or transfers    Functional Status Assessment  Patient has had a recent decline in their functional status and demonstrates the ability to make significant improvements in function in a reasonable and predictable amount of time.  Equipment Recommendations  Tub/shower seat    Recommendations for Other Services       Precautions / Restrictions Precautions Precautions:  ICD/Pacemaker Precaution Comments: Restrict AROM L UE <90 degrees shoulder flexion, limit pushing/pulling Restrictions Weight Bearing Restrictions: No      Mobility Bed Mobility Overal bed mobility: Needs Assistance Bed Mobility: Supine to Sit, Sit to Supine     Supine to sit: Min assist, HOB elevated Sit to supine: Min assist        Transfers Overall transfer level: Needs assistance   Transfers: Sit to/from Stand, Bed to chair/wheelchair/BSC Sit to Stand: Min assist     Step pivot transfers: Min assist            Balance Overall balance assessment: Needs assistance Sitting-balance support: Feet unsupported Sitting balance-Leahy Scale: Good     Standing balance support: Reliant on assistive device for balance Standing balance-Leahy Scale: Poor                             ADL either performed or assessed with clinical judgement   ADL       Grooming: Wash/dry hands;Wash/dry face;Set up;Sitting               Lower Body Dressing: Minimal assistance;Sit to/from stand   Toilet Transfer: Minimal assistance;Rolling walker (2 wheels);Regular Toilet;Ambulation                   Vision Baseline Vision/History: 2 Legally blind (R eye) Patient Visual Report: No change from baseline       Perception     Praxis      Pertinent Vitals/Pain Pain Assessment Pain Assessment: Faces Faces Pain Scale: Hurts little more Pain Location: low back Pain Descriptors / Indicators: Aching     Hand Dominance Right   Extremity/Trunk Assessment Upper Extremity Assessment Upper Extremity  Assessment: Overall WFL for tasks assessed   Lower Extremity Assessment Lower Extremity Assessment: Defer to PT evaluation   Cervical / Trunk Assessment Cervical / Trunk Assessment: Normal   Communication Communication Communication: Prefers language other than English   Cognition Arousal/Alertness: Awake/alert Behavior During Therapy: WFL for tasks  assessed/performed Overall Cognitive Status: Within Functional Limits for tasks assessed                                       General Comments   VSS on RA    Exercises     Shoulder Instructions      Home Living Family/patient expects to be discharged to:: Private residence Living Arrangements: Non-relatives/Friends Available Help at Discharge: Friend(s);Available 24 hours/day Type of Home: House Home Access: Stairs to enter [employer] of Steps: 2 Entrance Stairs-Rails: Right;Left;Can reach both Home Layout: One level     Bathroom Shower/Tub: Producer, television/film/video: Standard Bathroom Accessibility: Yes How Accessible: Accessible via walker Home Equipment: Adaptive equipment Adaptive Equipment: Reacher        Prior Functioning/Environment Prior Level of Function : Needs assist             Mobility Comments: amb without AD ADLs Comments: pt typical can dress/bath self, needs assist with hosiery sometimes. roommate does the  cooking, cleaning, driving        OT Problem List: Decreased strength;Decreased activity tolerance;Impaired balance (sitting and/or standing);Pain      OT Treatment/Interventions: Self-care/ADL training;Therapeutic activities;Patient/family education;Balance training;DME and/or AE instruction    OT Goals(Current goals can be found in the care plan section) Acute Rehab OT Goals Patient Stated Goal: Return home OT Goal Formulation: With patient Time For Goal Achievement: 01/30/23 Potential to Achieve Goals: Good ADL Goals Pt Will Perform Grooming: with modified independence;standing Pt Will Perform Lower Body Dressing: with modified independence;sit to/from stand;with adaptive equipment Pt Will Transfer to Toilet: with modified independence;ambulating;regular height toilet  OT Frequency: Min 2X/week    Co-evaluation              AM-PAC OT "6 Clicks" Daily Activity     Outcome Measure Help  from another person eating meals?: None Help from another person taking care of personal grooming?: A Little Help from another person toileting, which includes using toliet, bedpan, or urinal?: A Little Help from another person bathing (including washing, rinsing, drying)?: A Little Help from another person to put on and taking off regular upper body clothing?: A Little Help from another person to put on and taking off regular lower body clothing?: A Little 6 Click Score: 19   End of Session Equipment Utilized During Treatment: Rolling walker (2 wheels) Nurse Communication: Mobility status  Activity Tolerance: Patient tolerated treatment well Patient left: in bed;with call bell/phone within reach  OT Visit Diagnosis: Unsteadiness on feet (R26.81)                Time: 1191-4782 OT Time Calculation (min): 18 min Charges:  OT General Charges $OT Visit: 1 Visit OT Evaluation $OT Eval Moderate Complexity: 1 Mod  01/16/2023  RP, OTR/L  Acute Rehabilitation Services  Office:  [phone]   Suzanna Obey 01/16/2023, 11:00 AM

## 2023-01-17 DIAGNOSIS — N186 End stage renal disease: Secondary | ICD-10-CM

## 2023-01-17 DIAGNOSIS — R55 Syncope and collapse: Secondary | ICD-10-CM

## 2023-01-17 DIAGNOSIS — I442 Atrioventricular block, complete: Secondary | ICD-10-CM | POA: Diagnosis not present

## 2023-01-17 LAB — BASIC METABOLIC PANEL
Anion gap: 15 (ref 5–15)
BUN: 83 mg/dL — ABNORMAL HIGH (ref 8–23)
CO2: 22 mmol/L (ref 22–32)
Calcium: 9 mg/dL (ref 8.9–10.3)
Chloride: 96 mmol/L — ABNORMAL LOW (ref 98–111)
Creatinine, Ser: 7.63 mg/dL — ABNORMAL HIGH (ref 0.44–1.00)
GFR, Estimated: 5 mL/min — ABNORMAL LOW (ref >60)
Glucose, Bld: 96 mg/dL (ref 70–99)
Potassium: 5.5 mmol/L — ABNORMAL HIGH (ref 3.5–5.1)
Sodium: 133 mmol/L — ABNORMAL LOW (ref 135–145)

## 2023-01-17 LAB — GLUCOSE, CAPILLARY
Glucose-Capillary: 92 mg/dL (ref 70–99)
Glucose-Capillary: 105 mg/dL — ABNORMAL HIGH (ref 70–99)
Glucose-Capillary: 121 mg/dL — ABNORMAL HIGH (ref 70–99)

## 2023-01-17 LAB — CBC
HCT: 30.5 % — ABNORMAL LOW (ref 36.0–46.0)
Hemoglobin: 9.8 g/dL — ABNORMAL LOW (ref 12.0–15.0)
MCH: 33.4 pg (ref 26.0–34.0)
MCHC: 32.1 g/dL (ref 30.0–36.0)
MCV: 104.1 fL — ABNORMAL HIGH (ref 80.0–100.0)
Platelets: 106 10*3/uL — ABNORMAL LOW (ref 150–400)
RBC: 2.93 MIL/uL — ABNORMAL LOW (ref 3.87–5.11)
RDW: 14.1 % (ref 11.5–15.5)
WBC: 5.8 10*3/uL (ref 4.0–10.5)
nRBC: 0 % (ref 0.0–0.2)

## 2023-01-17 LAB — MAGNESIUM: Magnesium: 2.5 mg/dL — ABNORMAL HIGH (ref 1.7–2.4)

## 2023-01-17 LAB — PHOSPHORUS: Phosphorus: 9 mg/dL — ABNORMAL HIGH (ref 2.5–4.6)

## 2023-01-17 MED ORDER — AMLODIPINE BESYLATE 5 MG PO TABS: 5.0000 mg | ORAL_TABLET | Freq: Every day | ORAL | 1 refills | Status: DC

## 2023-01-17 NOTE — Progress Notes (Signed)
CSW received consult for patient. CSW spoke with patient at bedside using Language services interpretor Jose# [phone]. Patient reports she comes from home with roommate. CSW offered transportation resources. Patient accepted resources,informed CSW that she has transportation to doctor appointments and HD through [employer]. Patient reports she will have transportation when medically ready for dc.All questions answered. No further questions reported at this time.

## 2023-01-17 NOTE — Procedures (Signed)
HD Note:  Some information was entered later than the data was gathered due to patient care needs. The stated time with the data is accurate.  Patient treatment completed at bedside.  Alert and oriented.   Informed consent signed and in chart.   TX duration: 3.5 hours  Patient tolerated treatment well. Machine clotted and Dr. Thedore Mins was notified at approximately 1645.  Dr. Thedore Mins gave orders to end treatment as patient was going home.   Alert, without acute distress.   Access used: Upper left arm fistula Access issues: None  Total UF removed: 1000 ml  Hand-off given to patient's nurse.   Stacey Chambers L. Dareen Piano, RN [facility].

## 2023-01-17 NOTE — Evaluation (Signed)
Physical Therapy Evaluation Patient Details Name: Stacey Chambers MRN: [id number] DOB: 05-29-1951 Today's Date: 01/17/2023  History of Present Illness  72 year old [name]-speaking female admitted 01/13/23 with chief complaint of acute shortness of breath and presyncope.  Found to haveCHB; s/p PPM placement 7/15.  PMH includes: T2DM, ESRD, back surgery 2021.  Clinical Impression  Patient presents with mobility close to baseline.  Does not use a walker usually, but did today with S for lines.  She communicated need for shower chair, but states has a 3:1 at home so educated she can use for a shower chair.  She reports her tenant is assisting her and can continue at d/c.  Feel she will progress at home without follow up PT.  Noted VSS throughout.  Encouraged seated activities for now as stating she has fallen while cooking at home x 3 due to syncopal symptoms.  PT will sign off as planned d/c home today.        Assistance Recommended at Discharge PRN  If plan is discharge home, recommend the following:  Can travel by private vehicle  Help with stairs or ramp for entrance;Assist for transportation        Equipment Recommendations None recommended by PT  Recommendations for Other Services       Functional Status Assessment Patient has had a recent decline in their functional status and demonstrates the ability to make significant improvements in function in a reasonable and predictable amount of time.     Precautions / Restrictions Precautions Precautions: ICD/Pacemaker      Mobility  Bed Mobility Overal bed mobility: Modified Independent                  Transfers   Equipment used: Rolling walker (2 wheels) Transfers: Sit to/from Stand Sit to Stand: Supervision           General transfer comment: RW for balance up from EOB    Ambulation/Gait Ambulation/Gait assistance: Modified independent (Device/Increase time) Gait Distance (Feet): 200 Feet Assistive  device: Rolling walker (2 wheels) Gait Pattern/deviations: Step-through pattern, Decreased stride length       General Gait Details: mobilizing well with RW; no c/o dizziness throughout, VSS  Stairs            Wheelchair Mobility     Tilt Bed    Modified Rankin (Stroke Patients Only)       Balance Overall balance assessment: Needs assistance   Sitting balance-Leahy Scale: Good Sitting balance - Comments: states her tenant helps with her socks since her back surgery, she does not bend down   Standing balance support: No upper extremity supported Standing balance-Leahy Scale: Fair Standing balance comment: washing hands at sink no UE support                             Pertinent Vitals/Pain Pain Assessment Faces Pain Scale: Hurts a little bit Pain Location: chronic back issues Pain Descriptors / Indicators: Aching, Discomfort Pain Intervention(s): Monitored during session, Repositioned    Home Living Family/patient expects to be discharged to:: Private residence Living Arrangements: Non-relatives/Friends Available Help at Discharge: Friend(s);Available 24 hours/day Type of Home: House Home Access: Stairs to enter Entrance Stairs-Rails: Right;Left;Can reach both Entrance Stairs-Number of Steps: 2   Home Layout: One level Home Equipment: Agricultural consultant (2 wheels);BSC/3in1      Prior Function               Mobility Comments: amb  without AD ADLs Comments: pt typical can dress/bath self, needs assist with hosiery sometimes. roommate does the  cooking, cleaning, driving     Hand Dominance   Dominant Hand: Right    Extremity/Trunk Assessment   Upper Extremity Assessment Upper Extremity Assessment: Overall WFL for tasks assessed    Lower Extremity Assessment Lower Extremity Assessment: Overall WFL for tasks assessed       Communication   Communication: Prefers language other than English ([name] speaking utlized AMN video  interpreter)  Cognition Arousal/Alertness: Awake/alert Behavior During Therapy: WFL for tasks assessed/performed Overall Cognitive Status: Within Functional Limits for tasks assessed                                          General Comments General comments (skin integrity, edema, etc.): Educated in slow return to activity and to rest when feeling tired.  She reported she has fallen x 3 when standing to cook in kitchen and states due to dizziness/syncope but hopes with pacemaker will be better.  Once hit her head and got a big bump and MD send for imaging which was negative.  Initially asking for shower chair and I showed her a 3:1 which she reports she already has but did not know it would work in the shower.  Educated it is safe and designed to be used in shower if needed.    Exercises     Assessment/Plan    PT Assessment Patient does not need any further PT services  PT Problem List         PT Treatment Interventions      PT Goals (Current goals can be found in the Care Plan section)  Acute Rehab PT Goals PT Goal Formulation: All assessment and education complete, DC therapy    Frequency       Co-evaluation               AM-PAC PT "6 Clicks" Mobility  Outcome Measure Help needed turning from your back to your side while in a flat bed without using bedrails?: None Help needed moving from lying on your back to sitting on the side of a flat bed without using bedrails?: None Help needed moving to and from a bed to a chair (including a wheelchair)?: A Little Help needed standing up from a chair using your arms (e.g., wheelchair or bedside chair)?: A Little Help needed to walk in hospital room?: A Little Help needed climbing 3-5 steps with a railing? : A Little 6 Click Score: 20    End of Session Equipment Utilized During Treatment: Gait belt Activity Tolerance: Patient tolerated treatment well Patient left: in chair;with call bell/phone within reach    PT Visit Diagnosis: Muscle weakness (generalized) (M62.81)    Time: 1610-9604 PT Time Calculation (min) (ACUTE ONLY): 36 min   Charges:   PT Evaluation $PT Eval Moderate Complexity: 1 Mod PT Treatments $Self Care/Home Management: 8-22 PT General Charges $$ ACUTE PT VISIT: 1 Visit         Sheran Lawless, PT Acute Rehabilitation Services [phone] 01/17/2023   [name] 01/17/2023, 1:19 PM

## 2023-01-17 NOTE — Progress Notes (Signed)
PT Note  Patient seen and evaluated with full noted to follow.  Mobilizing well with RW and no DME or follow up needs at this time.  Stable for home when medically ready.   Sheran Lawless, PT Acute Rehabilitation Services Office:[phone] 01/17/2023

## 2023-01-17 NOTE — Progress Notes (Signed)
D/C order noted which states d/c after HD and PT. Contacted [facility] to advise clinic of pt's d/c today and that pt should resume care on Friday.   Olivia Canter Renal Navigator [phone]

## 2023-01-17 NOTE — Progress Notes (Signed)
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 Cont on IV Fe q 2wks w/ next date being 7/19 if still here.   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 Cont binder, do HD. Cont IV vdra Cirrhosis- per primary Chronic back pain-per primary Possible d/c after HD today.Okay with this from a nephrology perspective.   OP HD: [facility] MWF  3.5h   400/1.5    61.5kg  2/2 bath  AVF  Heparin none - last OP HD was 7/12, post wt 62.1kg - hectorol 2 mcg IV three times per week - venofer 100mg  q 2 wks, last 7/05, due 7/19  Subjective:   Patient seen and examined this AM in ICU. Some orthostatic dizziness but otherwise no new issues/complaints   Objective:   BP 135/68   Pulse 72   Temp 97.9 F (36.6 C) (Oral)   Resp 11   Ht 5\' 1"  (1.549 m)   Wt 61.3 kg   SpO2 92%   BMI 25.53 kg/m   Intake/Output Summary (Last 24 hours) at 01/17/2023 0759 Last data filed at 01/17/2023 0400 Gross per 24 hour  Intake 440 ml  Output --  Net 440 ml   Weight change: -3.8 kg  Physical Exam: Gen: NAD, laying flat in bed CVS: RRR Resp: CTA B/L Abd: soft Ext: no edema b/l Les Neuro: awake, alert Dialysis access: LUE AVF +b/t  Imaging: DG Chest 2 View  Result Date: 01/16/2023 CLINICAL DATA:  Status post pacemaker. EXAM: CHEST - 2 VIEW COMPARISON:  None Available. FINDINGS: The heart is enlarged and the mediastinal contour is within normal limits. Pulmonary vasculature is mildly distended. There is a small to moderate right pleural effusion and a trace left pleural effusion with atelectasis, edema, or infiltrate. No pneumothorax is seen. Spinal fusion hardware is noted over the lumbar spine. A mechanical device is present over the left chest. IMPRESSION: 1. Cardiomegaly with pulmonary vascular congestion. 2. Trace pleural effusion on the left  and small to moderate pleural effusion on the right with atelectasis, edema, or infiltrate. Electronically Signed   By: Thornell Sartorius M.D.   On: 01/16/2023 04:51   EP PPM/ICD IMPLANT  Result Date: 01/15/2023 CONCLUSIONS: 1.Successful implantation of a Medtronic Micra leadless pacemaker. 2.  Successful removal of the temporary pacemaker via the right internal jugular vein. 3. No early apparent complications.    Labs: BMET Recent Labs  Lab 01/13/23 1200 01/13/23 1226 01/13/23 2132 01/14/23 0031 01/14/23 0803 01/15/23 0816 01/15/23 1249 01/15/23 1638 01/15/23 2034 01/16/23 0122 01/16/23 0740 01/17/23 0243  NA 127* 127*  127* 128* 127*  --  130*  --   --   --  132*  --  133*  K 5.0 5.0  5.1 6.1* 4.9   < > 5.1 5.7* 4.5 4.7 5.1 4.7 5.5*  CL 89* 92* 90* 90*  --  98  --   --   --  94*  --  96*  CO2 22  --  21* 19*  --   --   --   --   --  24  --  22  GLUCOSE 119* 118* 87 270*  --  118*  --   --   --  130*  --  96  BUN 26* 26* 33* 36*  --  53*  --   --   --  53*  --  83*  CREATININE 3.93* 4.10* 4.60* 4.99*  --  8.10*  --   --   --  5.94*  --  7.63*  CALCIUM 10.3  --  9.2 8.9  --   --   --   --   --  8.9  --  9.0  PHOS 7.9*  --   --  9.3*  --   --   --   --   --  6.3*  --  9.0*   < > = values in this interval not displayed.   CBC Recent Labs  Lab 01/13/23 1200 01/13/23 1226 01/14/23 0031 01/15/23 0816 01/16/23 0122 01/17/23 0243  WBC 5.7  --  6.1  --  5.8 5.8  HGB 11.9*   < > 10.4* 10.2* 9.6* 9.8*  HCT 35.8*   < > 31.2* 30.0* 29.3* 30.5*  MCV 102.9*  --  101.0*  --  104.3* 104.1*  PLT 99*  --  101*  --  94* 106*   < > = values in this interval not displayed.    Medications:     amLODipine  5 mg Oral Daily   Chlorhexidine Gluconate Cloth  6 each Topical Daily   Chlorhexidine Gluconate Cloth  6 each Topical Q0600   hydrALAZINE  50 mg Oral TID   insulin aspart  0-6 Units Subcutaneous TID WC   mirtazapine  15 mg Oral QHS   sodium chloride flush  3 mL Intravenous Q12H       Anthony Sar, MD [facility] 01/17/2023, 7:59 AM

## 2023-01-17 NOTE — Discharge Summary (Addendum)
Physician Discharge Summary   Patient: Stacey Chambers MRN: [id number] DOB: 08/25/1950  Admit date:     01/13/2023  Discharge date: 01/17/23  Discharge Physician: Onalee Hua Horace Wishon   PCP: Lourena Simmonds, Donald Pore, MD   Recommendations at discharge:   Please follow up with primary care provider within 1-2 weeks  Please repeat BMP and CBC in one week   Discharge Diagnoses: 72 y/o female with history of ESRD (MWF), HTN, DM2, AOCD, alcohol liver cirrhosis, presented with sob and presyncope.  She was found in [facility]. Was given IV atropine with little effect and subsequently started on dopamine with improvement in heart rate, shortness of breath, and blood pressure. She was seen by cardiology with plan to bring her to cath lab and place temporary pacer. prior admission for complete heart block in April of this year felt secondary to hyperkalemia and metabolic derangements.  On 7/15 leadless PPM was placed by Dr. Elberta Fortis.  Hospital Course:  Complete heart block with cardiogenic shock and resultant lactic acidosis: resolved after TVP; now hypertensive.  - Leadless pacemaker implanted 7/15 - Appreciate EP - Telemetry monitoring - cleared for d/c by EP on 7/17--follow in device clinic   End-stage renal disease: (Monday Wednesday Friday schedule) last dialysis on 7/12 Volume overloaded: Fluid and electrolyte imbalance: hyponatremia, Hyperkalemia. Improved. - HD MWF, appreciate nephrology - receiving HD 7/17 prior to d/c   Hypertension - hydralazine, amlodipine. Increase amlodipine to 5mg    H/o cirrhosis, chronic thrombocytopenia related- compensated at present - supportive care   AOCD After initial drop, Hgb stable -no signs of active blood loss  Deconditioning -HHPT set up    Consultants: PCCM, renal Procedures performed: PPM  Disposition: Home Diet recommendation:  Renal diet DISCHARGE MEDICATION: Allergies as of 01/17/2023   No Known Allergies      Medication List     TAKE  these medications    acetaminophen 500 MG tablet Commonly known as: TYLENOL Take 500 mg by mouth every 8 (eight) hours as needed for moderate pain.   albuterol 108 (90 Base) MCG/ACT inhaler Commonly known as: VENTOLIN HFA Inhale into the lungs.   amLODipine 5 MG tablet Commonly known as: NORVASC Take 1 tablet (5 mg total) by mouth daily. Start taking on: January 18, 2023 What changed:  medication strength how much to take   guaiFENesin 100 MG/5ML liquid Commonly known as: ROBITUSSIN Take 5 mLs by mouth every 4 (four) hours as needed for cough or to loosen phlegm.   hydrALAZINE 50 MG tablet Commonly known as: APRESOLINE Take 1 tablet (50 mg total) by mouth 3 (three) times daily.   lidocaine-prilocaine cream Commonly known as: EMLA Apply 1 Application topically once.   losartan 100 MG tablet Commonly known as: COZAAR Take 100 mg by mouth daily.   mirtazapine 15 MG tablet Commonly known as: REMERON Take 15 mg by mouth at bedtime.   sevelamer carbonate 800 MG tablet Commonly known as: RENVELA Take 1 tablet (800 mg total) by mouth 3 (three) times daily with meals.        Discharge Exam: Filed Weights   01/15/23 0802 01/16/23 0400 01/17/23 0402  Weight: 65.1 kg 61.1 kg 61.3 kg   HEENT:  [place]/AT, No thrush, no icterus CV:  RRR, no rub, no S3, no S4 Lung:  bibasilar crackles. No wheeze Abd:  soft/+BS, NT Ext:  No edema, no lymphangitis, no synovitis, no rash   Condition at discharge: stable  The results of significant diagnostics from this hospitalization (including imaging, microbiology,  ancillary and laboratory) are listed below for reference.   Imaging Studies: DG Chest 2 View  Result Date: 01/16/2023 CLINICAL DATA:  Status post pacemaker. EXAM: CHEST - 2 VIEW COMPARISON:  None Available. FINDINGS: The heart is enlarged and the mediastinal contour is within normal limits. Pulmonary vasculature is mildly distended. There is a small to moderate right pleural  effusion and a trace left pleural effusion with atelectasis, edema, or infiltrate. No pneumothorax is seen. Spinal fusion hardware is noted over the lumbar spine. A mechanical device is present over the left chest. IMPRESSION: 1. Cardiomegaly with pulmonary vascular congestion. 2. Trace pleural effusion on the left and small to moderate pleural effusion on the right with atelectasis, edema, or infiltrate. Electronically Signed   By: Thornell Sartorius M.D.   On: 01/16/2023 04:51   EP PPM/ICD IMPLANT  Result Date: 01/15/2023 CONCLUSIONS: 1.Successful implantation of a Medtronic Micra leadless pacemaker. 2.  Successful removal of the temporary pacemaker via the right internal jugular vein. 3. No early apparent complications.   DG Chest Port 1 View  Result Date: 01/14/2023 CLINICAL DATA:  Pulmonary edema. EXAM: PORTABLE CHEST 1 VIEW COMPARISON:  Chest x-ray from yesterday. FINDINGS: Unchanged temporary pacing wire in the right ventricle. Stable cardiomegaly. Unchanged diffuse interstitial thickening with bibasilar atelectasis and small to moderate right pleural effusion. No pneumothorax. No acute osseous abnormality. IMPRESSION: 1. Unchanged congestive heart failure. Electronically Signed   By: Obie Dredge M.D.   On: 01/14/2023 09:02   DG Chest 1 View  Result Date: 01/13/2023 CLINICAL DATA:  Central line placement EXAM: CHEST  1 VIEW COMPARISON:  01/13/2023 FINDINGS: A right IJ introducer sheath is present with tip in the vicinity of the upper margin of the SVC. Extending through this is a temporary pacer lead with tip projecting over the right ventricle. No pneumothorax or procedure early related complication. Mild enlargement of the cardiopericardial silhouette is present with indistinct pulmonary vasculature favoring pulmonary venous hypertension. There is also a moderate to large right pleural effusion. Upper lumbar posterolateral rod and pedicle screw fixator. IMPRESSION: 1. Right IJ introducer sheath  with tip in the vicinity of the upper margin of the SVC. Extending through this is a temporary pacer lead with tip projecting over the right ventricle. No pneumothorax or procedure early related complication. 2. Moderate to large right pleural effusion. 3. Mild enlargement of the cardiopericardial silhouette with indistinct pulmonary vasculature favoring pulmonary venous hypertension. Electronically Signed   By: Gaylyn Rong M.D.   On: 01/13/2023 21:31   CARDIAC CATHETERIZATION  Result Date: 01/13/2023 1.  Successful temporary pacemaker placement from the right internal jugular approach with a threshold of 0.3 mA, output of 5 mA, and backup rate of 60 bpm. Recommendation: Monitoring in the ICU; obtain portable chest x-ray; the results were reviewed with Dr. Lalla Brothers.   DG Chest Port 1 View  Result Date: 01/13/2023 CLINICAL DATA:  Dyspnea EXAM: PORTABLE CHEST 1 VIEW COMPARISON:  Previous studies including the examination of 10/23/2022 FINDINGS: Transverse diameter of heart is increased. Central pulmonary vessels are not prominent. Small to moderate bilateral pleural effusions are seen, more so on the right side. There is increase in interstitial markings in the parahilar regions. Evaluation of right lower lung field infiltrates is limited body fusion. Rest of the lung fields show no focal infiltrates. IMPRESSION: Cardiomegaly. Central pulmonary vessels are more prominent. Increased interstitial markings are seen in parahilar regions suggesting CHF. Small to moderate bilateral pleural effusions, more so on the right side. Electronically Signed  By: Ernie Avena M.D.   On: 01/13/2023 14:43    Microbiology: Results for orders placed or performed during the hospital encounter of 01/13/23  MRSA Next Gen by PCR, Nasal     Status: None   Collection Time: 01/13/23  8:39 PM   Specimen: Nasal Mucosa; Nasal Swab  Result Value Ref Range Status   MRSA by PCR Next Gen NOT DETECTED NOT DETECTED Final     Comment: (NOTE) The GeneXpert MRSA Assay (FDA approved for NASAL specimens only), is one component of a comprehensive MRSA colonization surveillance program. It is not intended to diagnose MRSA infection nor to guide or monitor treatment for MRSA infections. Test performance is not FDA approved in patients less than 30 years old. Performed at [facility], [address], [place], [place] [address]   Surgical PCR screen     Status: None   Collection Time: 01/15/23  7:07 AM   Specimen: Nasal Mucosa; Nasal Swab  Result Value Ref Range Status   MRSA, PCR NEGATIVE NEGATIVE Final   Staphylococcus aureus NEGATIVE NEGATIVE Final    Comment: (NOTE) The Xpert SA Assay (FDA approved for NASAL specimens in patients 62 years of age and older), is one component of a comprehensive surveillance program. It is not intended to diagnose infection nor to guide or monitor treatment. Performed at [facility], [address] [place], [place] [address]     Labs: CBC: Recent Labs  Lab 01/13/23 1200 01/13/23 1226 01/14/23 0031 01/15/23 0816 01/16/23 0122 01/17/23 0243  WBC 5.7  --  6.1  --  5.8 5.8  HGB 11.9* 12.6  11.9* 10.4* 10.2* 9.6* 9.8*  HCT 35.8* 37.0  35.0* 31.2* 30.0* 29.3* 30.5*  MCV 102.9*  --  101.0*  --  104.3* 104.1*  PLT 99*  --  101*  --  94* 106*   Basic Metabolic Panel: Recent Labs  Lab 01/13/23 1200 01/13/23 1226 01/13/23 2132 01/14/23 0031 01/14/23 0803 01/15/23 0816 01/15/23 1249 01/15/23 1638 01/15/23 2034 01/16/23 0122 01/16/23 0740 01/17/23 0243  NA 127*   < > 128* 127*  --  130*  --   --   --  132*  --  133*  K 5.0   < > 6.1* 4.9   < > 5.1   < > 4.5 4.7 5.1 4.7 5.5*  CL 89*   < > 90* 90*  --  98  --   --   --  94*  --  96*  CO2 22  --  21* 19*  --   --   --   --   --  24  --  22  GLUCOSE 119*   < > 87 270*  --  118*  --   --   --  130*  --  96  BUN 26*   < > 33* 36*  --  53*  --   --   --  53*  --  83*  CREATININE 3.93*   < > 4.60* 4.99*   --  8.10*  --   --   --  5.94*  --  7.63*  CALCIUM 10.3  --  9.2 8.9  --   --   --   --   --  8.9  --  9.0  MG 2.6*  --  2.7* 2.7*  --   --   --   --   --  2.6*  --  2.5*  PHOS 7.9*  --   --  9.3*  --   --   --   --   --  6.3*  --  9.0*   < > = values in this interval not displayed.   Liver Function Tests: Recent Labs  Lab 01/13/23 1200 01/14/23 0031  AST 33 36  ALT 19 18  ALKPHOS 116 89  BILITOT 1.0 1.3*  PROT 7.9 7.4  ALBUMIN 3.3* 3.0*   CBG: Recent Labs  Lab 01/16/23 0747 01/16/23 1153 01/16/23 1539 01/16/23 2121 01/17/23 0656  GLUCAP 170* 90 119* 123* 92    Discharge time spent: greater than 30 minutes.  Signed: Catarina Hartshorn, MD [facility] 01/17/2023

## 2023-01-17 NOTE — Plan of Care (Signed)
Patient and family member educated on discharge instructions Including medications and follow up appointments. This RN answered all questions until no further questions. Patient placed transferred self to wheelchair and was escorted to her car.  Lauris Poag Marilouise Densmore RN 01/17/23 1625

## 2023-01-17 NOTE — Discharge Planning (Signed)
[facility] Patient Discharge Orders- [facility] CLINIC: [facility]  Patient's name: Stacey Chambers Admit/DC Dates: 01/13/2023 - 01/17/23  Discharge Diagnoses: Complete heart block w/cardiogenic shock w/resultant lactic acidosis - leadless pacemaker implanted 7/15  Volume overloaded  Aranesp: Given: no    Last Hgb: 9.8 PRBC's Given: no ESA dose for discharge: no change IV Iron dose at discharge: no change  Heparin change: no  EDW Change: no New EDW:   [place] Change: no  Access intervention/Change: no Details:  Hectorol/Calcitriol change: no  Discharge Labs: Calcium 9.0 Phosphorus 9.0 Albumin 3.0 K+ 5.5  IV Antibiotics: no Details:  On Coumadin?: no Last INR: Next INR: Managed By:   OTHER/APPTS/LAB ORDERS:    D/C Meds to be reconciled by nurse after every discharge.  Completed By:   Reviewed by: MD:______ RN_______

## 2023-01-17 NOTE — Procedures (Signed)
I was present at this dialysis session. I have reviewed the session itself and made appropriate changes.   Filed Weights   01/15/23 0802 01/16/23 0400 01/17/23 0402  Weight: 65.1 kg 61.1 kg 61.3 kg    Recent Labs  Lab 01/17/23 0243  NA 133*  K 5.5*  CL 96*  CO2 22  GLUCOSE 96  BUN 83*  CREATININE 7.63*  CALCIUM 9.0  PHOS 9.0*    Recent Labs  Lab 01/14/23 0031 01/15/23 0816 01/16/23 0122 01/17/23 0243  WBC 6.1  --  5.8 5.8  HGB 10.4* 10.2* 9.6* 9.8*  HCT 31.2* 30.0* 29.3* 30.5*  MCV 101.0*  --  104.3* 104.1*  PLT 101*  --  94* 106*    Scheduled Meds:  amLODipine  5 mg Oral Daily   Chlorhexidine Gluconate Cloth  6 each Topical Daily   Chlorhexidine Gluconate Cloth  6 each Topical Q0600   hydrALAZINE  50 mg Oral TID   insulin aspart  0-6 Units Subcutaneous TID WC   mirtazapine  15 mg Oral QHS   sodium chloride flush  3 mL Intravenous Q12H   Continuous Infusions:  sodium chloride     PRN Meds:.sodium chloride, acetaminophen, atropine, docusate sodium, hydrocortisone cream, melatonin, ondansetron (ZOFRAN) IV, mouth rinse, polyethylene glycol, sodium chloride flush, traMADol, traZODone   Anthony Sar, MD [facility] 01/17/2023, 4:13 PM

## 2023-01-25 ENCOUNTER — Ambulatory Visit: Payer: 59 | Attending: Cardiovascular Disease

## 2023-01-25 ENCOUNTER — Ambulatory Visit: Payer: 59

## 2023-01-25 DIAGNOSIS — I442 Atrioventricular block, complete: Secondary | ICD-10-CM

## 2023-01-25 LAB — CUP PACEART INCLINIC DEVICE CHECK
Date Time Interrogation Session: 20240725124332
Implantable Pulse Generator Implant Date: 20240715

## 2023-01-25 NOTE — Patient Instructions (Addendum)
Despus de Press photographer del marcapasos para Engineer, manufacturing enrojecimiento, hinchazn y drenaje. Llame a la clnica del dispositivo al [phone] si experimenta estos sntomas o fiebre/escalofros.   Su incisin se cerr con Steri-strips o grapas: Puede ducharse 7 das despus del procedimiento y lavar la incisin con agua y [place]. Evite lociones, ungentos o perfumes sobre la incisin hasta que haya sanado bien.   Puede usar un jacuzzi o una piscina despus de su cita para revisar la herida si la incisin est completamente cerrada.   No levante, empuje ni tire ms de 10 libras con el brazo afectado hasta 6 semanas despus del procedimiento. No existen otras restricciones en el movimiento del brazo despus de su cita para el control de heridas.   Puede conducir, a menos que sus proveedores de atencin Scientist, research (medical) restringido la conduccin.   La monitorizacin remota se [place] para controlar su marcapasos desde casa. Este monitoreo es programado cada [address] por nuestra oficina. Nos permite vigilar el funcionamiento de su dispositivo para garantizar que funcione correctamente. Ver a su electrofisilogo de forma rutinaria anualmente (ms a menudo si es necesario). After Your Pacemaker   Monitor your pacemaker site for redness, swelling, and drainage. Call the device clinic at [phone] if you experience these symptoms or fever/chills.  Your incision was closed with Steri-strips or staples:  You may shower 7 days after your procedure and wash your incision with soap and water. Avoid lotions, ointments, or perfumes over your incision until it is well-healed.  You may use a hot tub or a pool after your wound check appointment if the incision is completely closed.  Do not lift, push or pull greater than 10 pounds with the affected arm until 6 weeks after your procedure. There are no other restrictions in arm movement after your wound check appointment.  You may drive,  unless driving has been restricted by your healthcare providers.  Remote monitoring is used to monitor your pacemaker from home. This monitoring is scheduled every 91 days by our office. It allows [place] to keep an eye on the functioning of your device to ensure it is working properly. You will routinely see your Electrophysiologist annually (more often if necessary).

## 2023-01-25 NOTE — Progress Notes (Signed)
Leadless check in clinic. Please see attached report.    Groin site evaluated and well healed.    Follow up instructions given to Pt.  All questions answered.  In depth discussion on submitting remote checks for leadless pacemaker.

## 2023-01-29 ENCOUNTER — Ambulatory Visit: Payer: 59

## 2023-03-16 IMAGING — US US PARACENTESIS
1 series · 5 of 5 positions shown · non-contrast
Comparison: none

INDICATION: Patient with history of cirrhosis by imaging, portal hypertension,
mild splenomegaly, ascites, pleural effusions, [id number] positive,
worsening renal function; request received for diagnostic
paracentesis.

[Series 1: us paracentesis mc & wl · 5 of 5 slices shown]
[im 1/5]
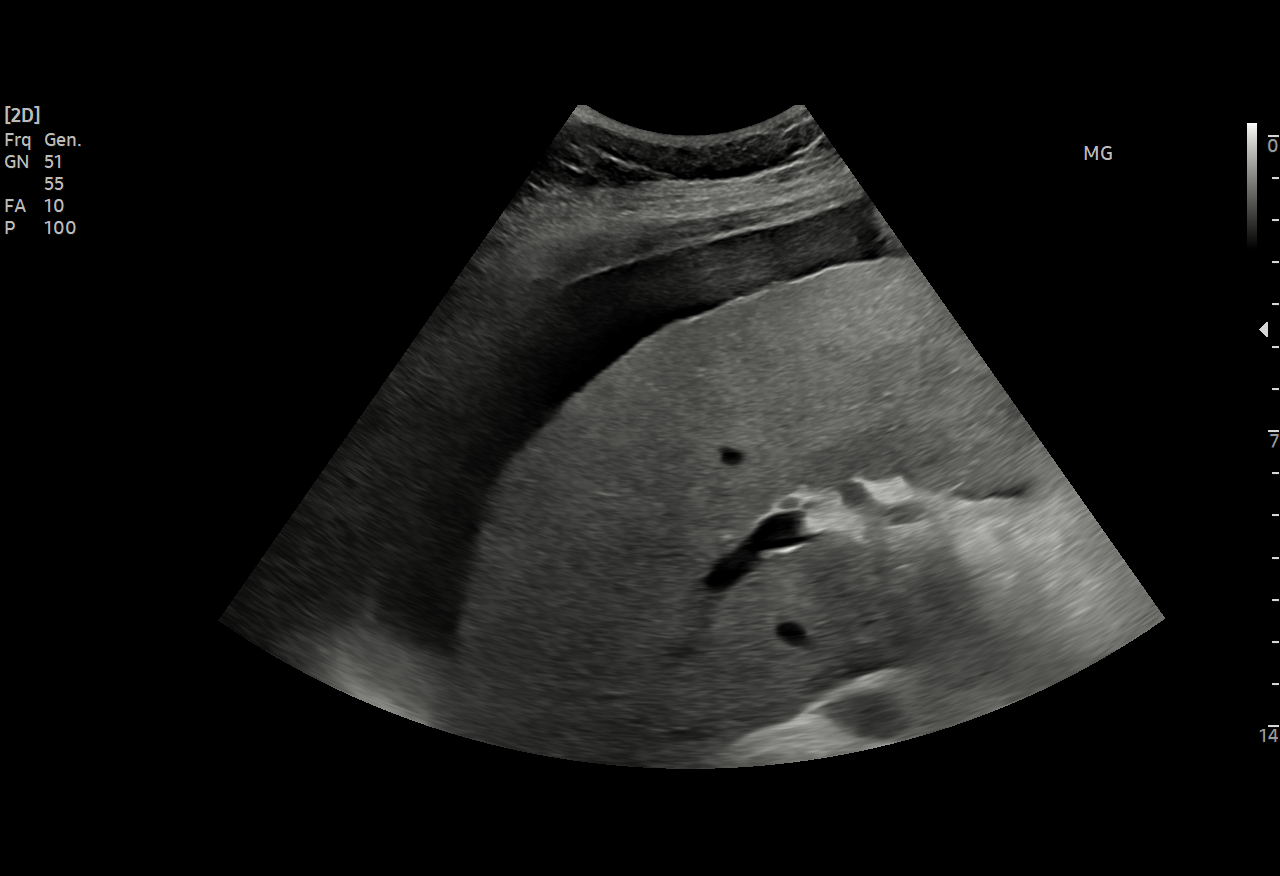
[im 2/5]
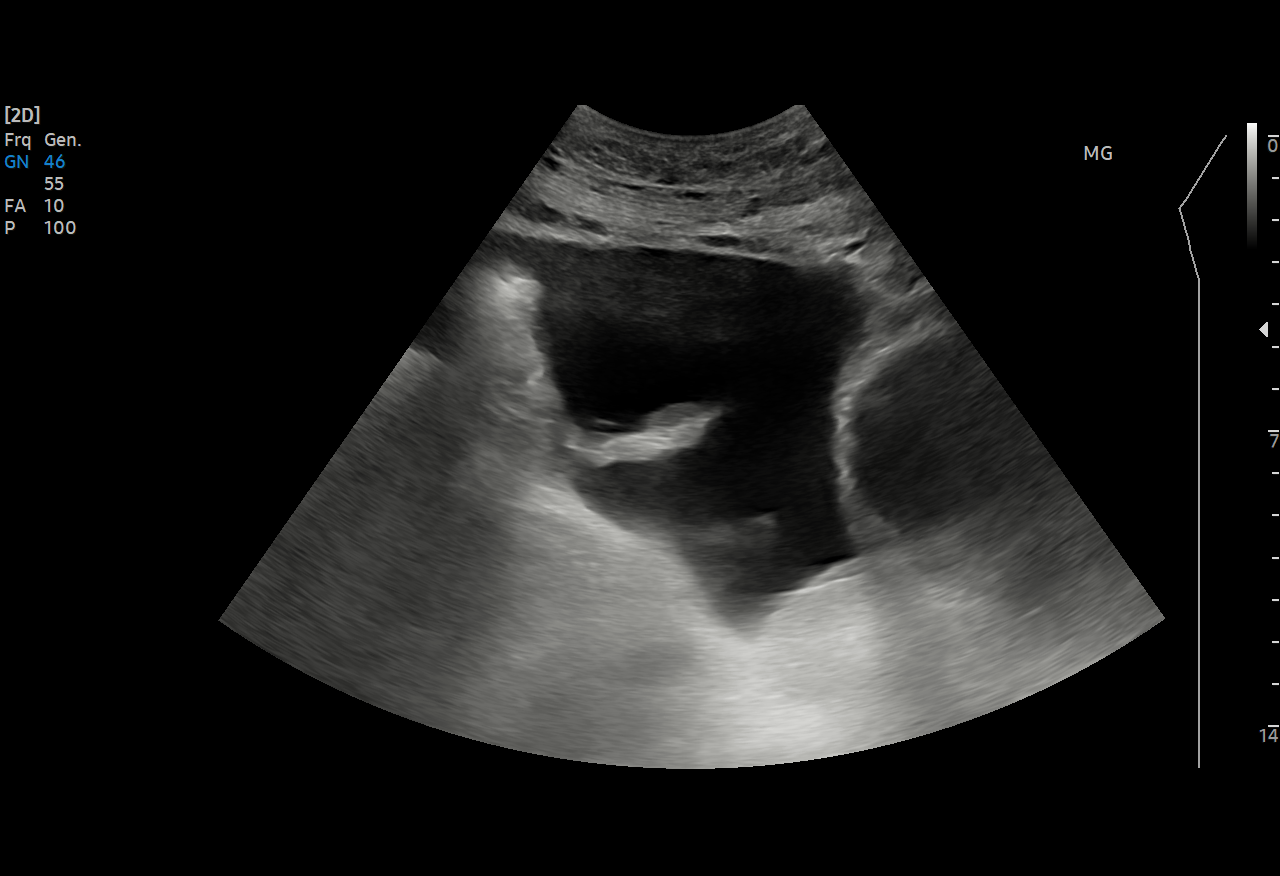
[im 3/5]
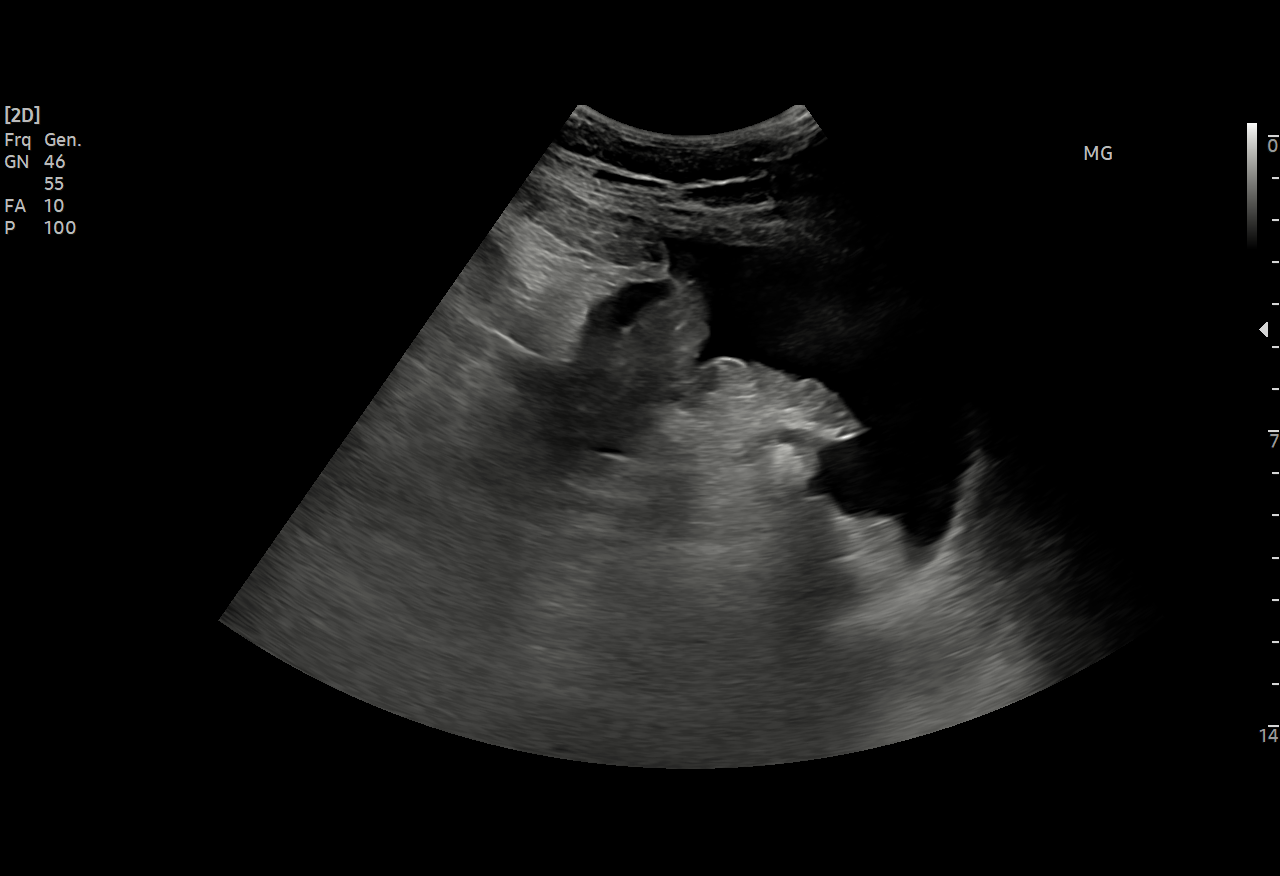
[im 4/5]
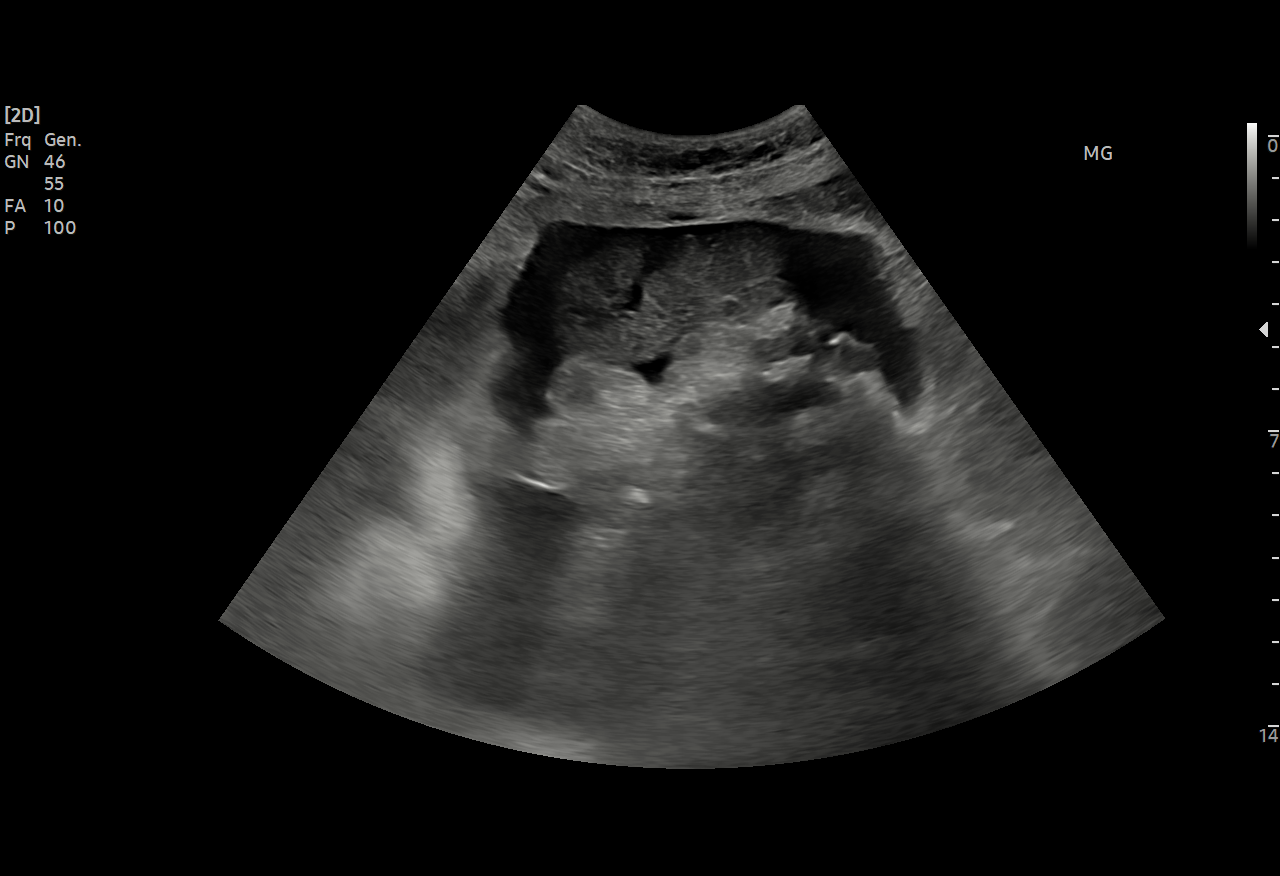
[im 5/5]
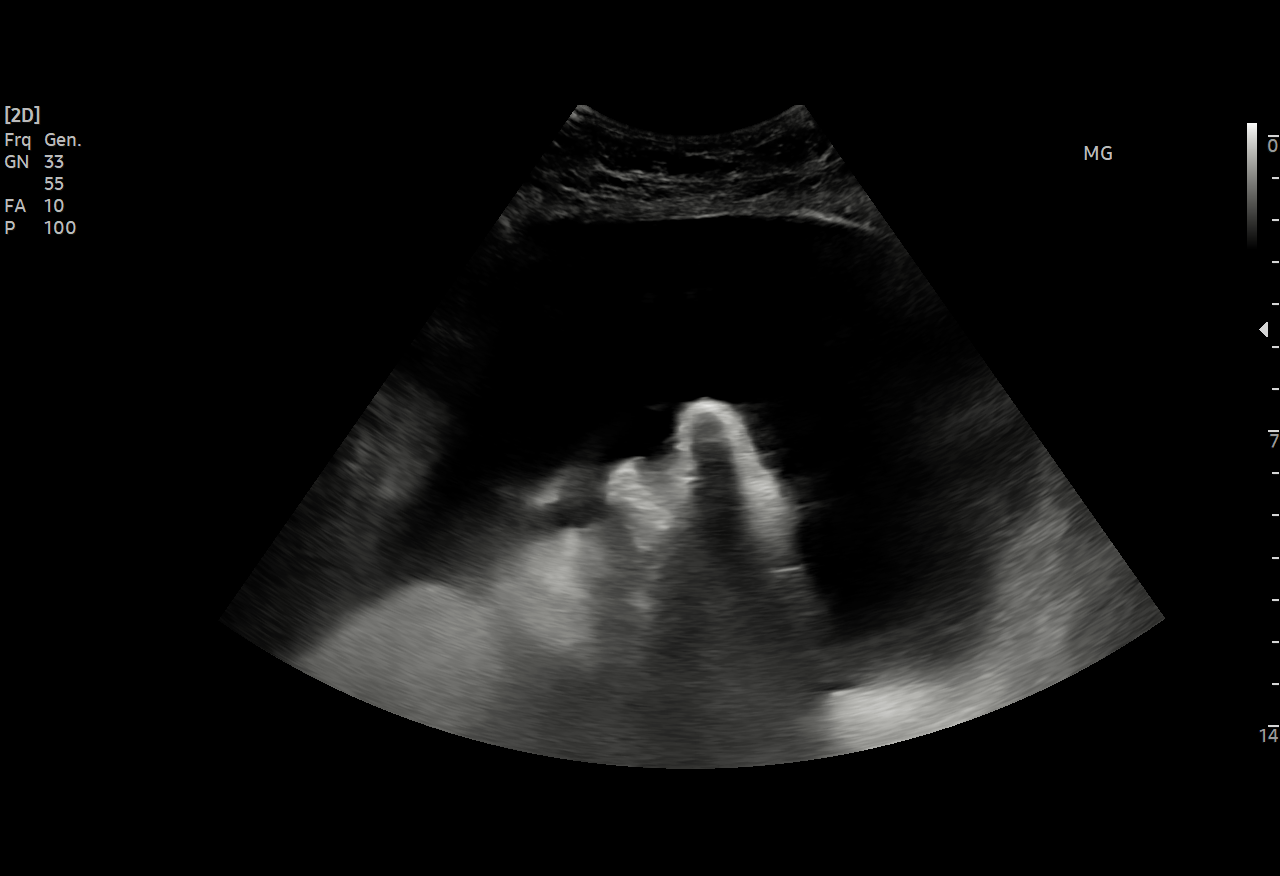

[5 of 5 positions shown; findings below may reference images not displayed]

EXAM:
ULTRASOUND GUIDED DIAGNOSTIC PARACENTESIS

MEDICATIONS:
8 mL 1% lidocaine

COMPLICATIONS:
None immediate.

PROCEDURE:
Informed written consent was obtained from the patient via
interpreter after a discussion of the risks, benefits and
alternatives to treatment. A timeout was performed prior to the
initiation of the procedure.

Initial ultrasound scanning demonstrates a moderate amount of
ascites within the right lower abdominal quadrant. The right lower
abdomen was prepped and draped in the usual sterile fashion. 1%
lidocaine was used for local anesthesia.

Following this, a 19 gauge, 10-cm, Yueh catheter was introduced. An
ultrasound image was saved for documentation purposes. The
paracentesis was performed. The catheter was removed and a dressing
was applied. The patient tolerated the procedure well without
immediate post procedural complication.
FINDINGS: A total of approximately 180 cc of hazy, yellow fluid was removed.
Samples were sent to the laboratory as requested by the clinical
team.
IMPRESSION: Successful ultrasound-guided diagnostic paracentesis yielding 180 cc
of peritoneal fluid.

## 2023-03-16 IMAGING — CT CT ABD-PELV W/O CM
2 of 4 series · 16 of 46 positions shown, 18 images · non-contrast
Comparison: MRI 06/29/2021

CLINICAL DATA: Abdominal pain, acute, nonlocalized



[Series 2: axial st · axial · 0.83mm/px · z∈[-406,-26]mm · 13 of 86 slices shown, 15 images]
[im 5/86  soft-tissue]
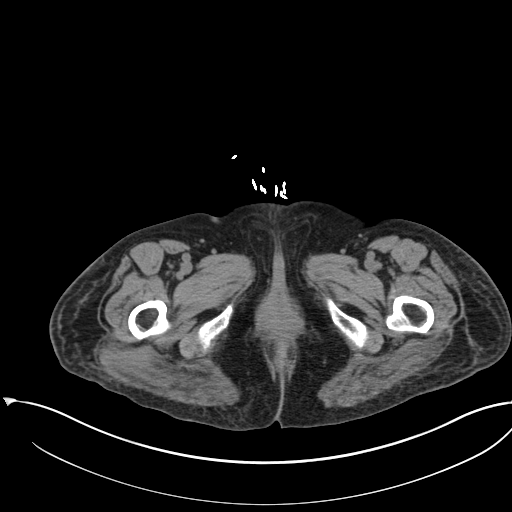
[im 5/86  bone]
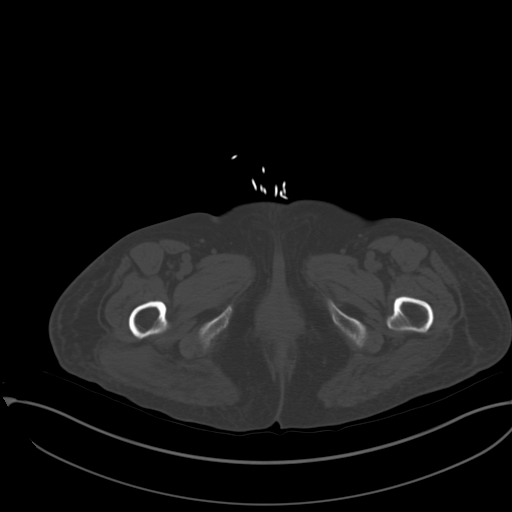
[im 13/86  soft-tissue]
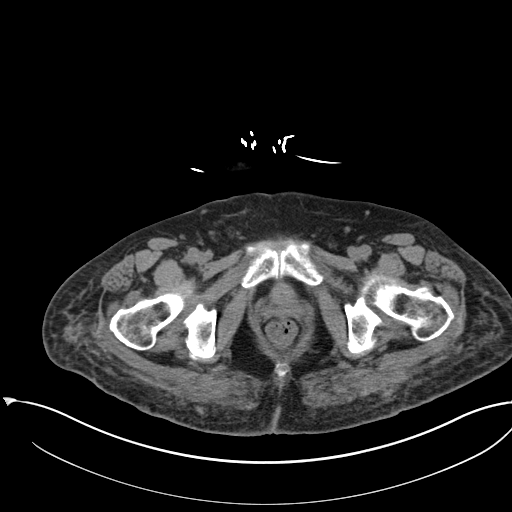
[im 18/86  soft-tissue]
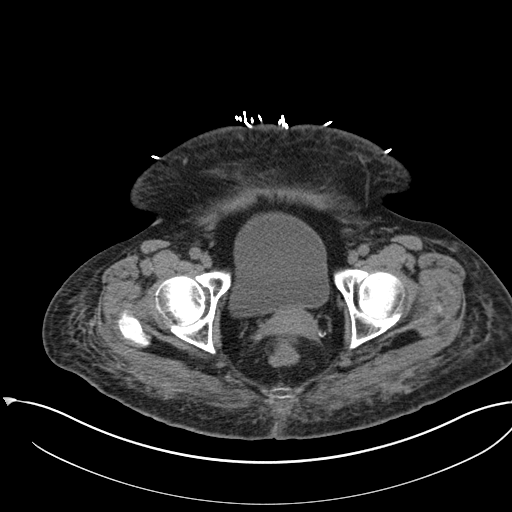
[im 26/86  soft-tissue]
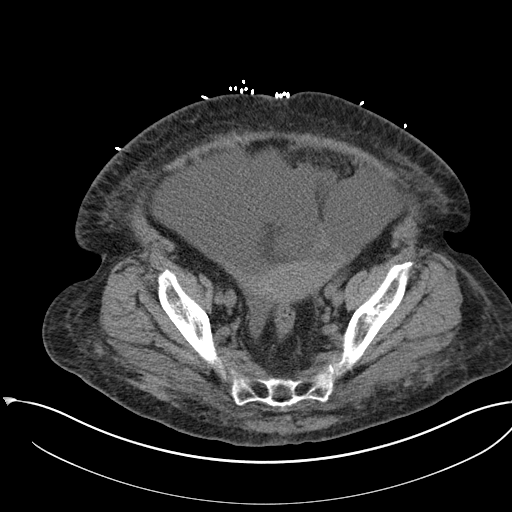
[im 30/86  soft-tissue]
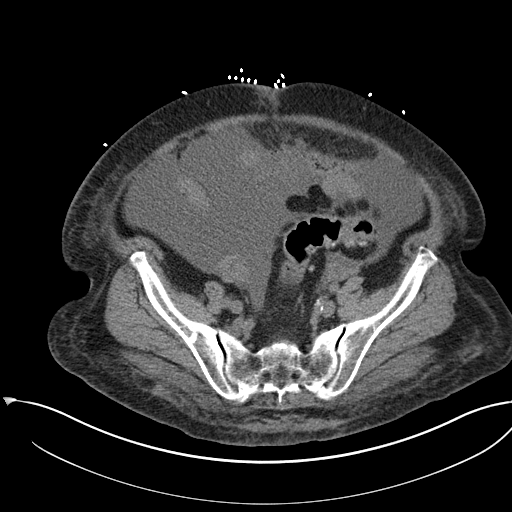
[im 39/86  soft-tissue]
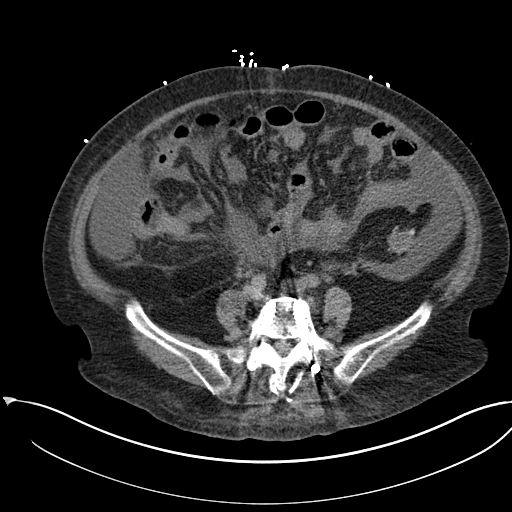
[im 43/86  soft-tissue]
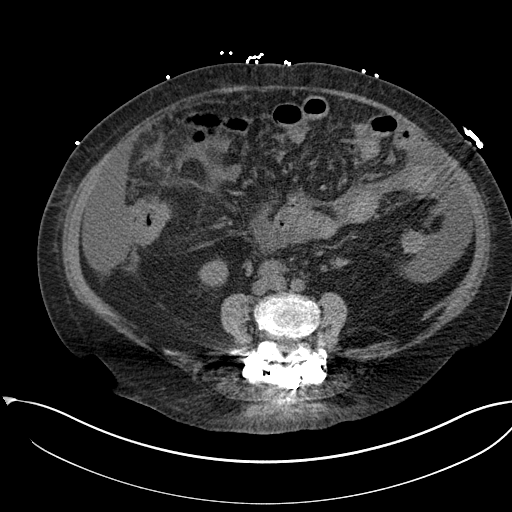
[im 47/86  soft-tissue]
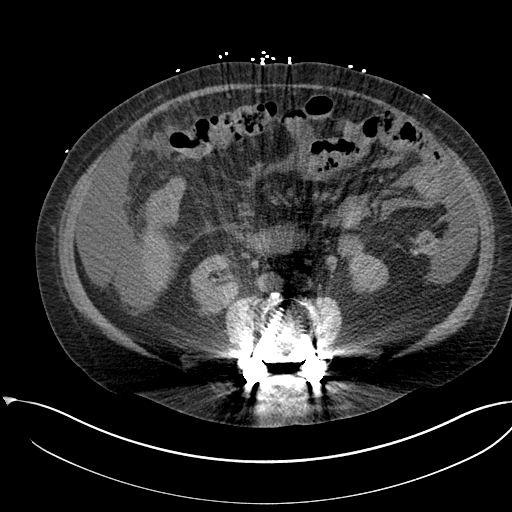
[im 56/86  soft-tissue]
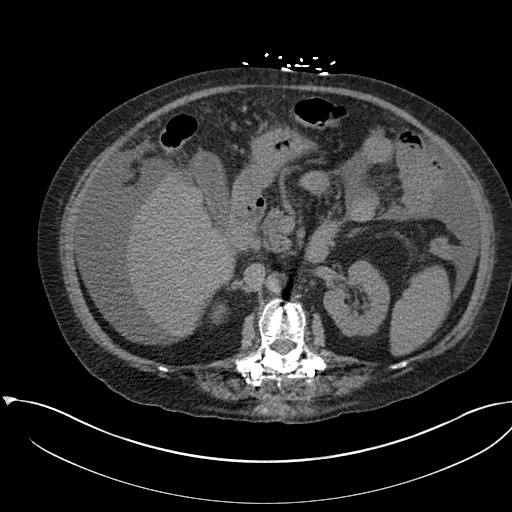
[im 56/86  bone]
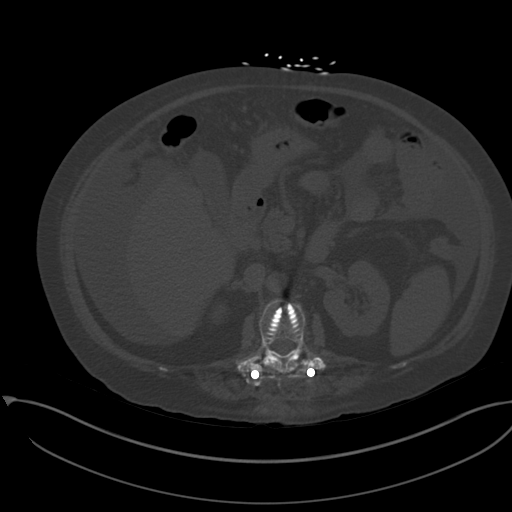
[im 60/86  soft-tissue]
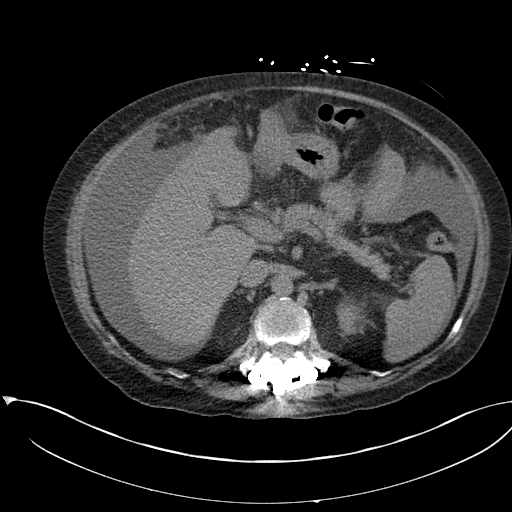
[im 69/86  soft-tissue]
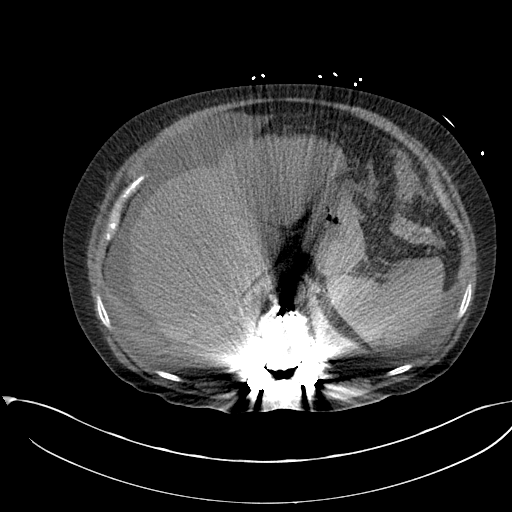
[im 73/86  soft-tissue]
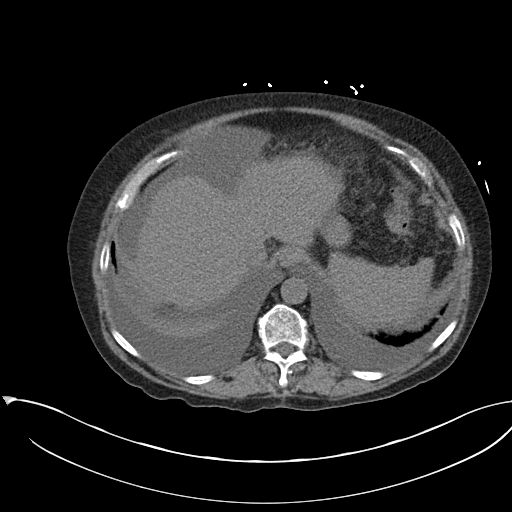
[im 81/86  soft-tissue]
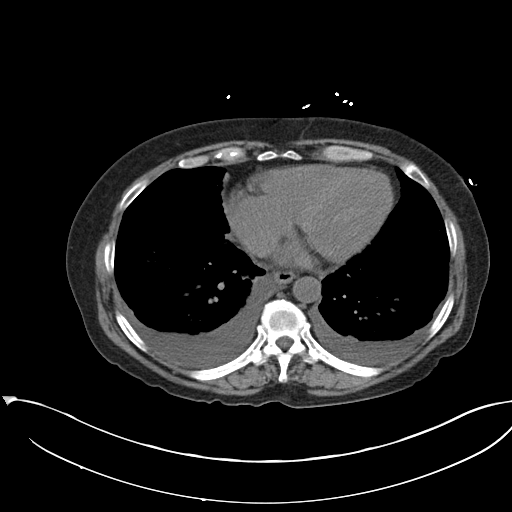

[Series 4: coronal st · coronal · 0.79mm/px · 3 of 163 slices shown]
[im 55/163  soft-tissue]
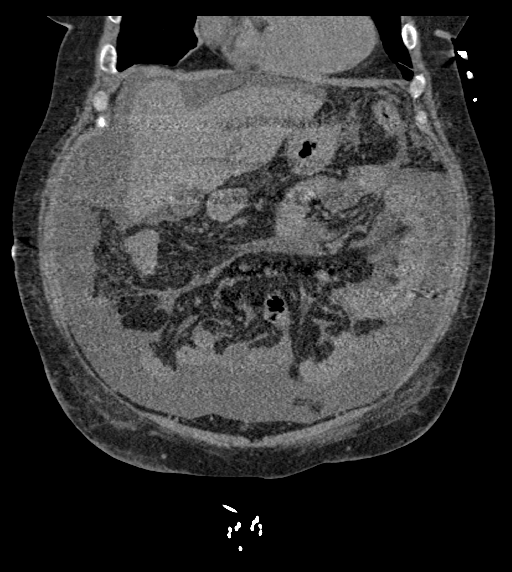
[im 73/163  soft-tissue]
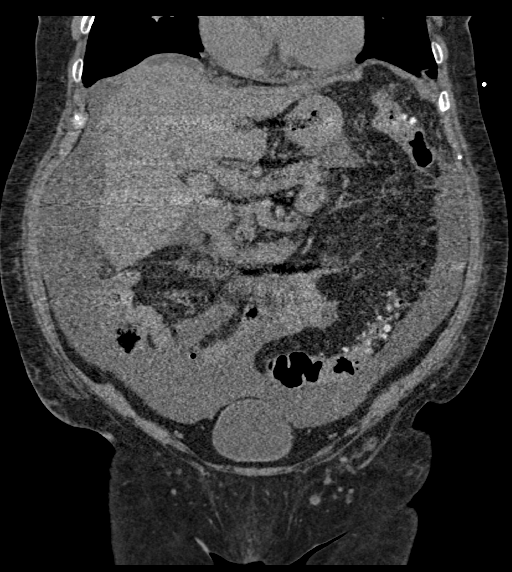
[im 91/163  soft-tissue]
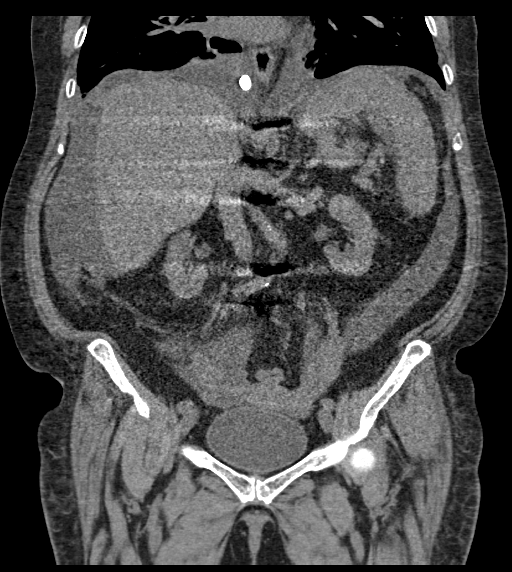

[16 of 46 positions shown; findings below may reference images not displayed]

FINDINGS: Lower chest: Small layering bilateral pleural effusions with mild
dependent bibasilar airspace opacities. Heart size within normal
limits.

Hepatobiliary: Nodular hepatic surface contour. No focal liver
lesion identified on unenhanced imaging. Probable small stone in the
region of the gallbladder neck. Gallbladder wall appears thickened.

Pancreas: Unremarkable. No pancreatic ductal dilatation or
surrounding inflammatory changes.

Spleen: Mild splenomegaly.  No focal splenic lesion identified.

Adrenals/Urinary Tract: Unremarkable adrenal glands. 2.9 cm slightly
hyperdense lesion arising anteriorly from the lower pole of the left
kidney previously seen to be a benign cyst on prior MRI. Kidneys
otherwise within normal limits. No renal lesion or hydronephrosis.
Urinary bladder is unremarkable.

Stomach/Bowel: Stomach is within normal limits. Appendix appears
normal. Left-sided colonic diverticulosis. No evidence of bowel wall
thickening, distention, or inflammatory changes.

Vascular/Lymphatic: Scattered aortoiliac atherosclerotic
calcifications without aneurysm. No abdominopelvic lymphadenopathy.

Reproductive: Uterus and bilateral adnexa are unremarkable.

Other: Moderate volume ascites.  No pneumoperitoneum.

Musculoskeletal: Anasarca. Prior T12-L5 posterior spinal fusion.
Chronic appearing severe compression fracture of the L3 vertebral
body.
IMPRESSION: 1. Cirrhotic liver with evidence of portal hypertension including
mild splenomegaly and moderate volume ascites.
2. Small layering bilateral pleural effusions with mild dependent
bibasilar airspace opacities, which may represent atelectasis and/or
pneumonia.
3. Probable small stone in the region of the gallbladder neck with
associated gallbladder wall thickening. If there is clinical concern
for acute cholecystitis, further evaluation with right upper
quadrant ultrasound is recommended.
4. Colonic diverticulosis without evidence of acute diverticulitis.
5. Chronic appearing severe compression fracture of the L3 vertebral
body.
6. Aortic Atherosclerosis ([id number]-1RD.D).

## 2023-03-17 IMAGING — US US RENAL
1 series · 14 of 25 positions shown · non-contrast
Comparison: Right upper quadrant ultrasound of 12/01/2021. CT
12/01/2021.

CLINICAL DATA: Acute renal insufficiency.

EXAM:
RENAL / URINARY TRACT ULTRASOUND COMPLETE

[Series 1: us renal · 14 of 36 slices shown]
[im 1/36]
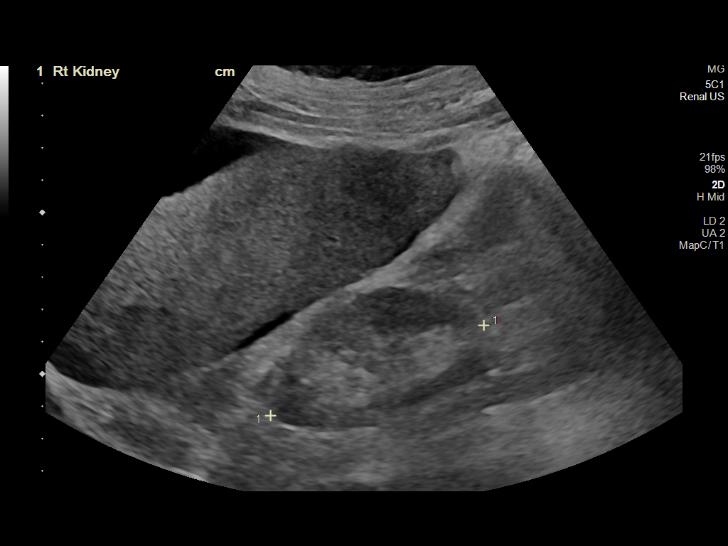
[im 3/36]
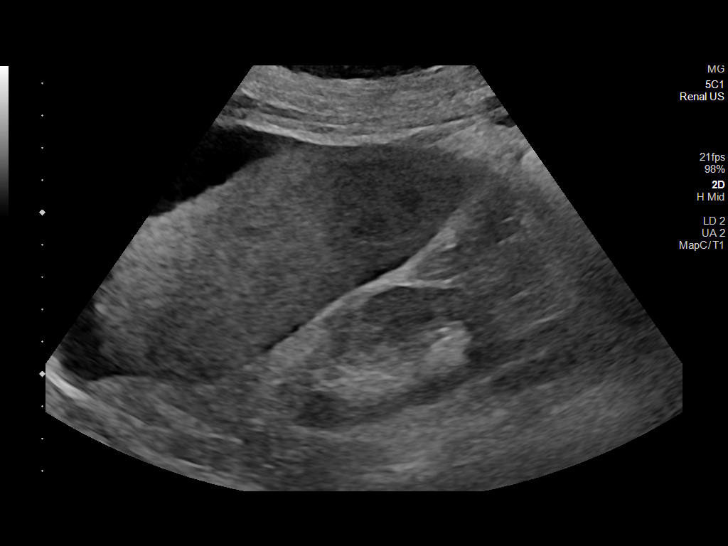
[im 6/36]
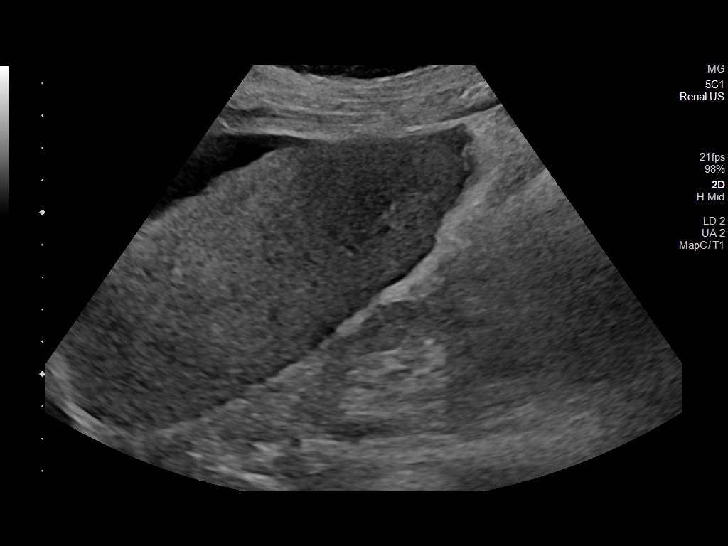
[im 9/36]
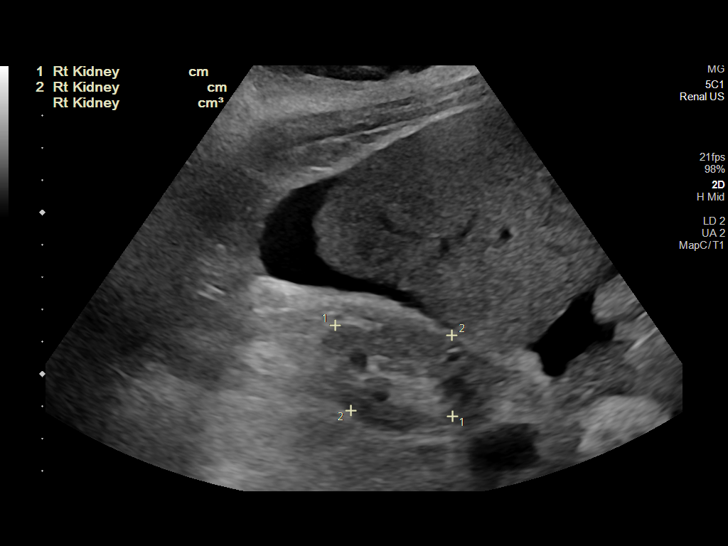
[im 12/36]
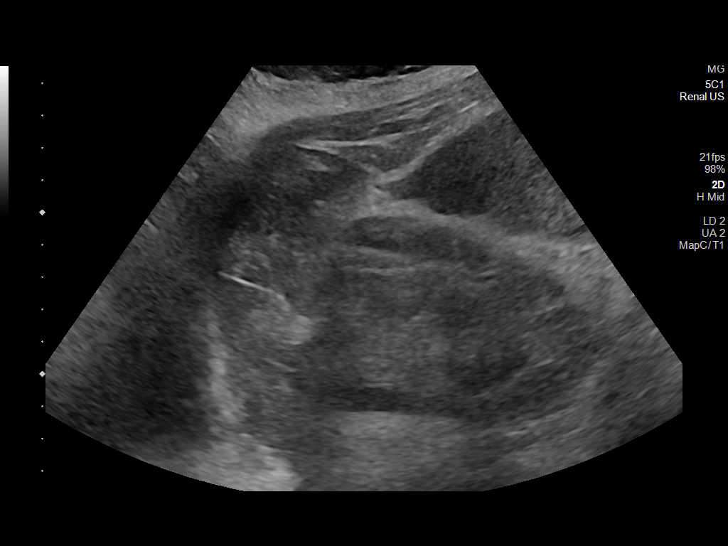
[im 14/36]
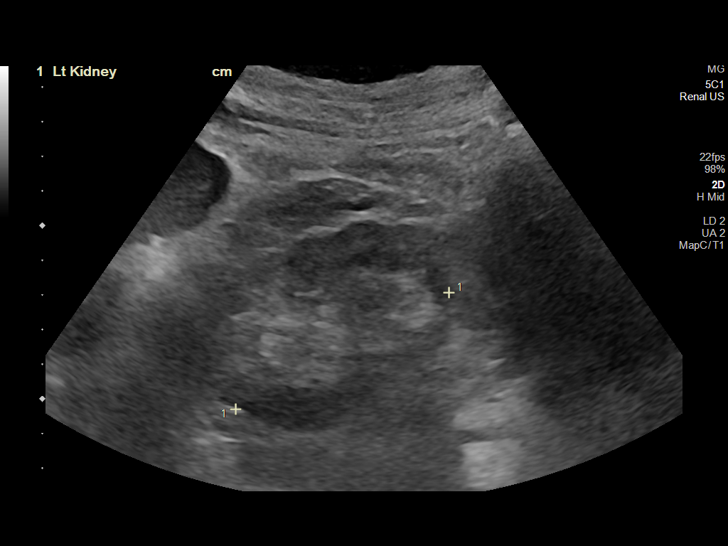
[im 17/36]
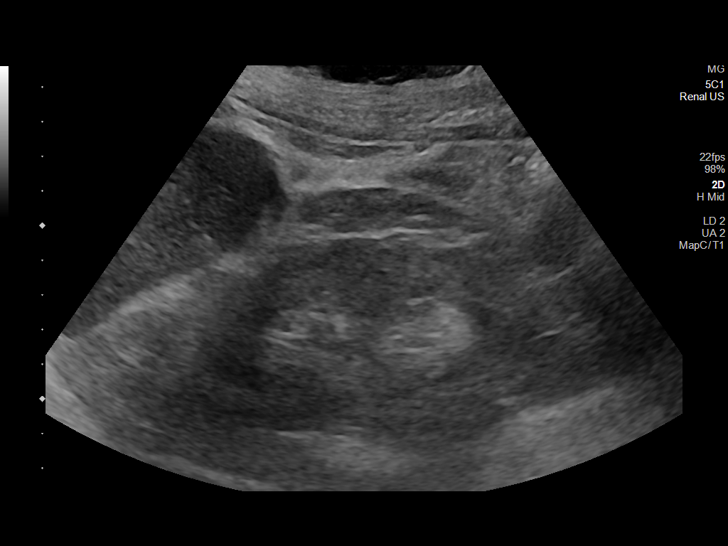
[im 19/36]
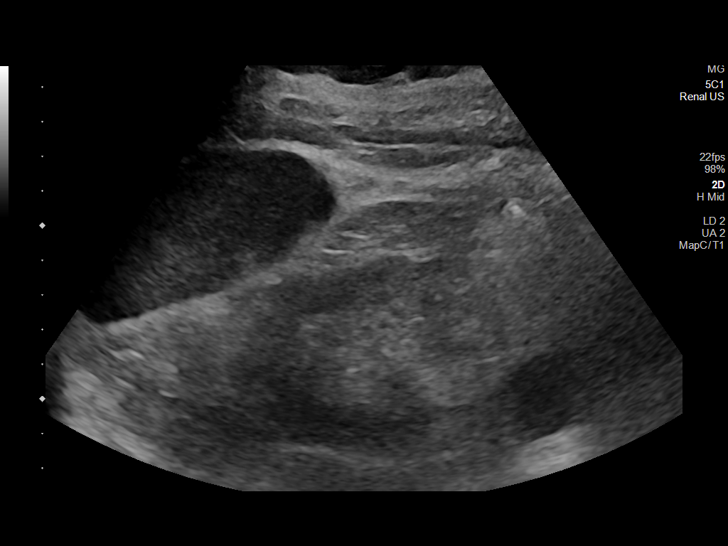
[im 22/36]
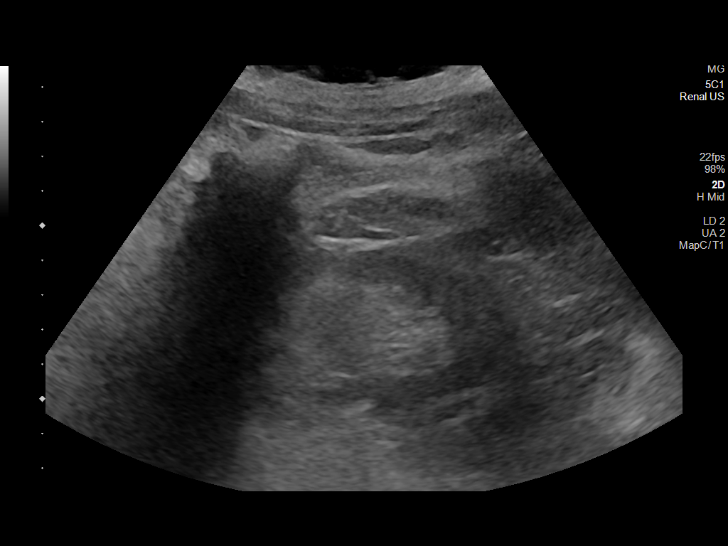
[im 24/36]
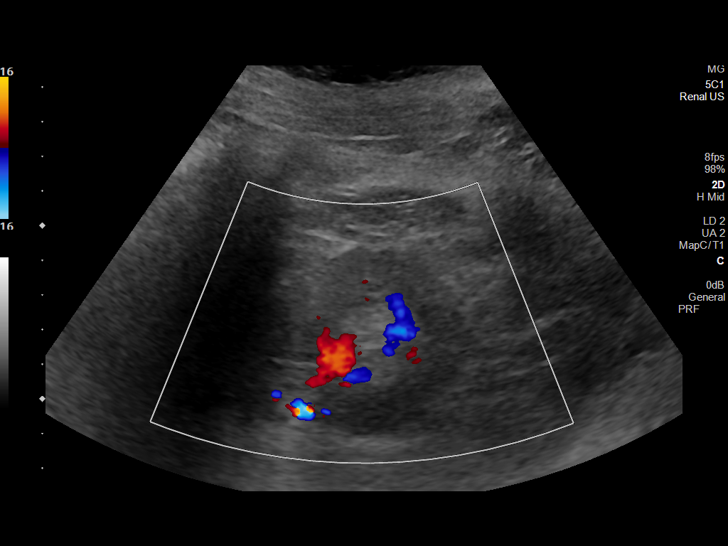
[im 27/36]
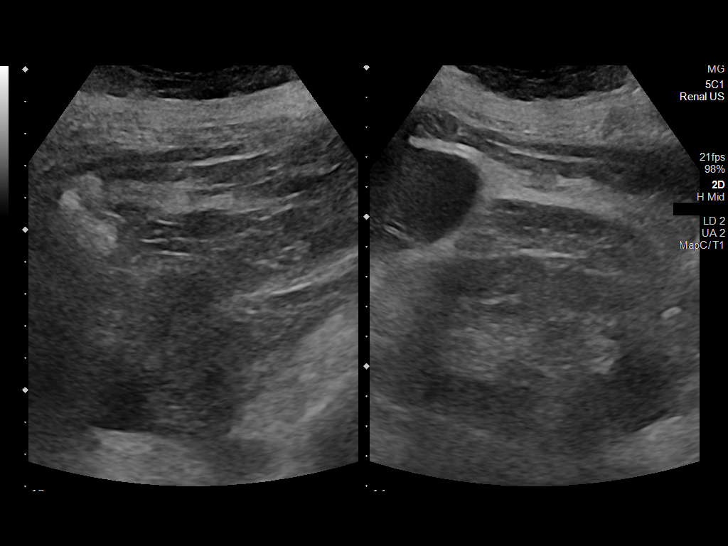
[im 30/36]
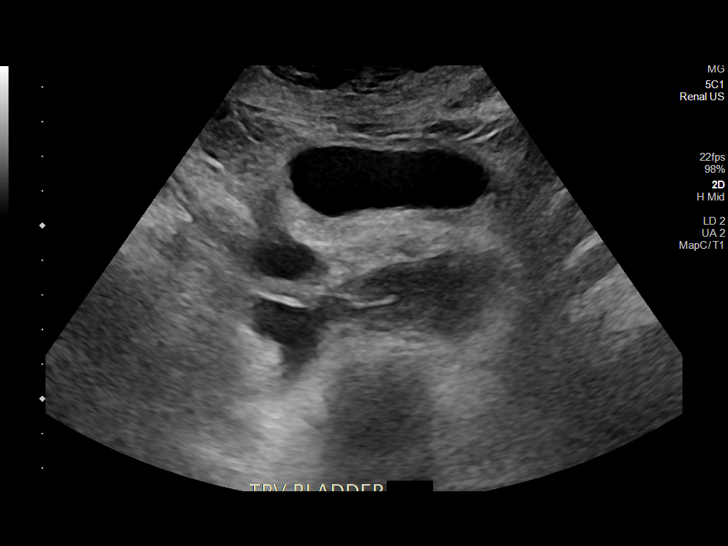
[im 33/36]
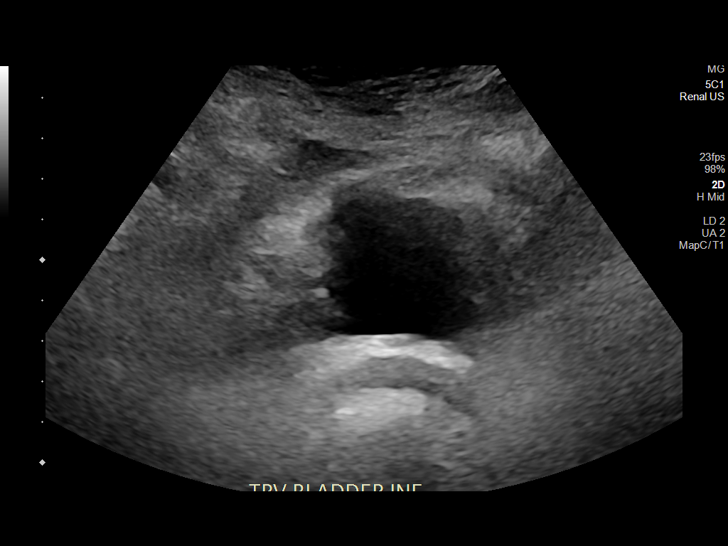
[im 36/36]
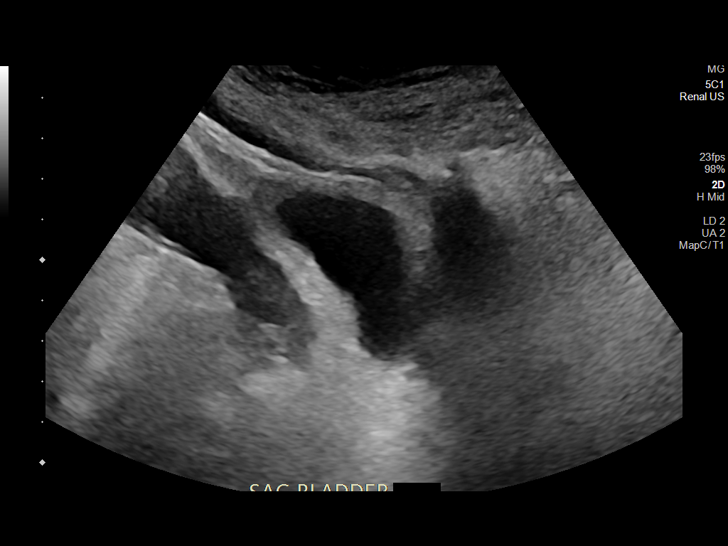

[14 of 25 positions shown; findings below may reference images not displayed]

FINDINGS: Right Kidney:

Renal measurements: 7.2 x 4.6 x 3.4 cm = volume: 67 mL. Mildly
increased renal echogenicity. Mild renal cortical thinning. No
hydronephrosis.

Left Kidney:

Renal measurements: 7.0 x 4.2 x 5.6 cm = volume: 86 mL. Mildly
increased renal echogenicity. Normal cortical thickness. No
hydronephrosis. 2.5 cm lesion within the lower pole was
characterized on the MRI of 06/29/2021 as a complex cyst.

Bladder:

Appears normal for degree of bladder distention.

Other:

Abdominal ascites and cirrhosis.
IMPRESSION: 1. No hydronephrosis.
2. Increased renal echogenicity, suggesting medical renal disease.
3. Cirrhosis and ascites.

## 2023-03-20 ENCOUNTER — Ambulatory Visit (INDEPENDENT_AMBULATORY_CARE_PROVIDER_SITE_OTHER): Payer: 59 | Admitting: Vascular Surgery

## 2023-03-20 ENCOUNTER — Encounter: Payer: Self-pay | Admitting: Vascular Surgery

## 2023-03-20 VITALS — BP 144/70 | HR 85 | Temp 98.2°F | Ht 61.0 in | Wt 142.8 lb

## 2023-03-20 DIAGNOSIS — N186 End stage renal disease: Secondary | ICD-10-CM

## 2023-03-20 NOTE — Progress Notes (Signed)
Patient name: Stacey Chambers MRN: [id number] DOB: Jan 07, 1951 Sex: female  REASON FOR CONSULT: Evaluation of aneurysm with shiny skin over AV fistula  HPI: Stacey Chambers is a 72 y.o. female, with history of cirrhosis, diabetes, ESRD, hypertension presents for evaluation of aneurysm and shiny skin on her AV fistula.  She has had a left brachiocephalic AV fistula on 12/23/2021.  She states the fistula is working well.  She has noticed some dilated segments.  No bleeding events.  No prolonged bleeding after the needles were removed.  Past Medical History:  Diagnosis Date   Anemia    Cirrhosis (HCC)    Diabetes mellitus without complication (HCC)    ESRD (end stage renal disease) on dialysis (HCC)    Hypertension    Type 2 diabetes mellitus (HCC)    Vision loss     Past Surgical History:  Procedure Laterality Date   AV FISTULA PLACEMENT Left 12/23/2021   Procedure: INSERTION OF LEFT ARM BRACHIOCEPHALIC ARTERIOVENOUS FISTULA;  Surgeon: Cephus Shelling, MD;  Location: [facility] OR;  Service: Vascular;  Laterality: Left;   BACK SURGERY     COLONOSCOPY     IR FLUORO GUIDE CV LINE RIGHT  12/16/2021   IR US GUIDE VASC ACCESS RIGHT  12/16/2021   PACEMAKER LEADLESS INSERTION N/A 01/15/2023   Procedure: PACEMAKER LEADLESS INSERTION;  Surgeon: Lanier Prude, MD;  Location: [facility] IN[facility];  Service: Cardiovascular;  Laterality: N/A;   TEMPORARY PACEMAKER N/A 01/13/2023   Procedure: TEMPORARY PACEMAKER;  Surgeon: Orbie Pyo, MD;  Location: [facility];  Service: Cardiovascular;  Laterality: N/A;   UPPER GASTROINTESTINAL ENDOSCOPY      Family History  Problem Relation Age of Onset   Diabetes Brother    Colon cancer Neg Hx    Esophageal cancer Neg Hx    Stomach cancer Neg Hx    Pancreatic cancer Neg Hx    Inflammatory bowel disease Neg Hx    Liver disease Neg Hx    Rectal cancer Neg Hx    Colon polyps Neg Hx     SOCIAL HISTORY: Social History    Socioeconomic History   Marital status: Married    Spouse name: Not on file   Number of children: Not on file   Years of education: Not on file   Highest education level: Not on file  Occupational History   Not on file  Tobacco Use   Smoking status: Never   Smokeless tobacco: Never  Vaping Use   Vaping status: Never Used  Substance and Sexual Activity   Alcohol use: Never    Comment: once in awhile   Drug use: Not Currently    Types: Marijuana    Comment: Use of THC tea 1-2 times per week   Sexual activity: Not on file  Other Topics Concern   Not on file  Social History Narrative   Not on file   Social Determinants of Health   Financial Resource Strain: Not on file  Food Insecurity: No Food Insecurity (01/16/2023)   Hunger Vital Sign    Worried About Running Out of Food in the Last Year: Never true    Ran Out of Food in the Last Year: Never true  Transportation Needs: No Transportation Needs (01/16/2023)   PRAPARE - Administrator, Civil Service (Medical): No    Lack of Transportation (Non-Medical): No  Physical Activity: Not on file  Stress: Not on file  Social Connections:  Unknown (09/14/2022)   Received from [facility], [facility]   Social Network    Social Network: Not on file  Intimate Partner Violence: Not At Risk (01/16/2023)   Humiliation, Afraid, Rape, and Kick questionnaire    Fear of Current or Ex-Partner: No    Emotionally Abused: No    Physically Abused: No    Sexually Abused: No    No Known Allergies  Current Outpatient Medications  Medication Sig Dispense Refill   acetaminophen (TYLENOL) 500 MG tablet Take 500 mg by mouth every 8 (eight) hours as needed for moderate pain.     albuterol (VENTOLIN HFA) 108 (90 Base) MCG/ACT inhaler Inhale into the lungs.     amLODipine (NORVASC) 5 MG tablet Take 1 tablet (5 mg total) by mouth daily. 30 tablet 1   guaiFENesin (ROBITUSSIN) 100 MG/5ML liquid Take 5 mLs by mouth every 4 (four) hours  as needed for cough or to loosen phlegm. 120 mL 0   hydrALAZINE (APRESOLINE) 50 MG tablet Take 1 tablet (50 mg total) by mouth 3 (three) times daily. 270 tablet 3   lidocaine-prilocaine (EMLA) cream Apply 1 Application topically once.     losartan (COZAAR) 100 MG tablet Take 100 mg by mouth daily.     mirtazapine (REMERON) 15 MG tablet Take 15 mg by mouth at bedtime.     sevelamer carbonate (RENVELA) 800 MG tablet Take 1 tablet (800 mg total) by mouth 3 (three) times daily with meals. 90 tablet 1   No current facility-administered medications for this visit.    REVIEW OF SYSTEMS:  [X]  denotes positive finding, [ ]  denotes negative finding Cardiac  Comments:  Chest pain or chest pressure:    Shortness of breath upon exertion:    Short of breath when lying flat:    Irregular heart rhythm:        Vascular    Pain in calf, thigh, or hip brought on by ambulation:    Pain in feet at night that wakes you up from your sleep:     Blood clot in your veins:    Leg swelling:         Pulmonary    Oxygen at home:    Productive cough:     Wheezing:         Neurologic    Sudden weakness in arms or legs:     Sudden numbness in arms or legs:     Sudden onset of difficulty speaking or slurred speech:    Temporary loss of vision in one eye:     Problems with dizziness:         Gastrointestinal    Blood in stool:     Vomited blood:         Genitourinary    Burning when urinating:     Blood in urine:        Psychiatric    Major depression:         Hematologic    Bleeding problems:    Problems with blood clotting too easily:        Skin    Rashes or ulcers:        Constitutional    Fever or chills:      PHYSICAL EXAM: There were no vitals filed for this visit.  GENERAL: The patient is a well-nourished female, in no acute distress. The vital signs are documented above. CARDIAC: There is a regular rate and rhythm.  VASCULAR:  Left arm AV fistula  with excellent thrill Two  aneurysmal segments as pictured with no overlying ulcer Left radial pulse palpable PULMONARY: No respiratory distress. ABDOMEN: Soft and non-tender. MUSCULOSKELETAL: There are no major deformities or cyanosis. NEUROLOGIC: No focal weakness or paresthesias are detected. SKIN: There are no ulcers or rashes noted. PSYCHIATRIC: The patient has a normal affect.    DATA:   N/A  Assessment/Plan:  72 y.o. female, with history of cirrhosis, diabetes, ESRD, hypertension presents for evaluation of aneurysm and shiny skin over her left arm AV fistula.  I discussed that her left arm brachiocephalic AV fistula does have several aneurysmal segments as pictured.  There is no ulceration and therefore I do not think she needs any surgical intervention at this time.  I discussed these often get aneurysmal over time from repeated access and that alone is not an indication to revise the fistula.  The bigger risk is ulceration, scab or thinning skin that would lead to bleeding risk.  I think the skin looks fine as pictured above.  She can follow-up with [place] as needed.   Cephus Shelling, MD [facility] Office: [phone]

## 2023-04-11 ENCOUNTER — Emergency Department (HOSPITAL_COMMUNITY): Payer: 59

## 2023-04-11 ENCOUNTER — Other Ambulatory Visit: Payer: Self-pay

## 2023-04-11 ENCOUNTER — Inpatient Hospital Stay (HOSPITAL_COMMUNITY)
Admission: EM | Admit: 2023-04-11 | Discharge: 2023-04-19 | DRG: 308 | Disposition: A | Discharge status: Home / Self Care | Payer: 59 | Attending: Internal Medicine | Admitting: Internal Medicine

## 2023-04-11 ENCOUNTER — Inpatient Hospital Stay (HOSPITAL_COMMUNITY): Payer: 59

## 2023-04-11 ENCOUNTER — Encounter (HOSPITAL_COMMUNITY): Payer: Self-pay

## 2023-04-11 DIAGNOSIS — N186 End stage renal disease: Secondary | ICD-10-CM | POA: Diagnosis present

## 2023-04-11 DIAGNOSIS — M545 Low back pain, unspecified: Secondary | ICD-10-CM | POA: Diagnosis present

## 2023-04-11 DIAGNOSIS — Z950 Presence of cardiac pacemaker: Secondary | ICD-10-CM | POA: Diagnosis not present

## 2023-04-11 DIAGNOSIS — Z9889 Other specified postprocedural states: Secondary | ICD-10-CM

## 2023-04-11 DIAGNOSIS — E876 Hypokalemia: Secondary | ICD-10-CM | POA: Diagnosis present

## 2023-04-11 DIAGNOSIS — I442 Atrioventricular block, complete: Secondary | ICD-10-CM | POA: Diagnosis present

## 2023-04-11 DIAGNOSIS — R079 Chest pain, unspecified: Secondary | ICD-10-CM | POA: Diagnosis present

## 2023-04-11 DIAGNOSIS — I10 Essential (primary) hypertension: Secondary | ICD-10-CM | POA: Diagnosis not present

## 2023-04-11 DIAGNOSIS — I5032 Chronic diastolic (congestive) heart failure: Secondary | ICD-10-CM | POA: Insufficient documentation

## 2023-04-11 DIAGNOSIS — I3139 Other pericardial effusion (noninflammatory): Secondary | ICD-10-CM | POA: Diagnosis present

## 2023-04-11 DIAGNOSIS — Z992 Dependence on renal dialysis: Secondary | ICD-10-CM | POA: Diagnosis not present

## 2023-04-11 DIAGNOSIS — R109 Unspecified abdominal pain: Secondary | ICD-10-CM

## 2023-04-11 DIAGNOSIS — D631 Anemia in chronic kidney disease: Secondary | ICD-10-CM | POA: Diagnosis present

## 2023-04-11 DIAGNOSIS — G8929 Other chronic pain: Secondary | ICD-10-CM | POA: Diagnosis present

## 2023-04-11 DIAGNOSIS — I4891 Unspecified atrial fibrillation: Principal | ICD-10-CM | POA: Diagnosis present

## 2023-04-11 DIAGNOSIS — H547 Unspecified visual loss: Secondary | ICD-10-CM | POA: Diagnosis present

## 2023-04-11 DIAGNOSIS — Z91013 Allergy to seafood: Secondary | ICD-10-CM

## 2023-04-11 DIAGNOSIS — E118 Type 2 diabetes mellitus with unspecified complications: Secondary | ICD-10-CM | POA: Diagnosis present

## 2023-04-11 DIAGNOSIS — E119 Type 2 diabetes mellitus without complications: Secondary | ICD-10-CM | POA: Diagnosis present

## 2023-04-11 DIAGNOSIS — Z1152 Encounter for screening for COVID-19: Secondary | ICD-10-CM | POA: Diagnosis not present

## 2023-04-11 DIAGNOSIS — Z7901 Long term (current) use of anticoagulants: Secondary | ICD-10-CM

## 2023-04-11 DIAGNOSIS — Z603 Acculturation difficulty: Secondary | ICD-10-CM | POA: Diagnosis present

## 2023-04-11 DIAGNOSIS — M898X9 Other specified disorders of bone, unspecified site: Secondary | ICD-10-CM | POA: Diagnosis present

## 2023-04-11 DIAGNOSIS — I314 Cardiac tamponade: Secondary | ICD-10-CM | POA: Diagnosis not present

## 2023-04-11 DIAGNOSIS — Z833 Family history of diabetes mellitus: Secondary | ICD-10-CM

## 2023-04-11 DIAGNOSIS — Z79899 Other long term (current) drug therapy: Secondary | ICD-10-CM

## 2023-04-11 DIAGNOSIS — I480 Paroxysmal atrial fibrillation: Principal | ICD-10-CM | POA: Insufficient documentation

## 2023-04-11 DIAGNOSIS — E1122 Type 2 diabetes mellitus with diabetic chronic kidney disease: Secondary | ICD-10-CM | POA: Diagnosis present

## 2023-04-11 DIAGNOSIS — D696 Thrombocytopenia, unspecified: Secondary | ICD-10-CM | POA: Diagnosis present

## 2023-04-11 DIAGNOSIS — J9601 Acute respiratory failure with hypoxia: Secondary | ICD-10-CM | POA: Diagnosis not present

## 2023-04-11 DIAGNOSIS — I132 Hypertensive heart and chronic kidney disease with heart failure and with stage 5 chronic kidney disease, or end stage renal disease: Secondary | ICD-10-CM | POA: Diagnosis present

## 2023-04-11 DIAGNOSIS — I452 Bifascicular block: Secondary | ICD-10-CM | POA: Diagnosis present

## 2023-04-11 DIAGNOSIS — K7030 Alcoholic cirrhosis of liver without ascites: Secondary | ICD-10-CM | POA: Diagnosis present

## 2023-04-11 LAB — RESPIRATORY PANEL BY PCR

## 2023-04-11 LAB — CBC
HCT: 26 % — ABNORMAL LOW (ref 36.0–46.0)
Hemoglobin: 8.8 g/dL — ABNORMAL LOW (ref 12.0–15.0)
MCH: 33.7 pg (ref 26.0–34.0)
MCHC: 33.8 g/dL (ref 30.0–36.0)
MCV: 99.6 fL (ref 80.0–100.0)
Platelets: 126 10*3/uL — ABNORMAL LOW (ref 150–400)
RBC: 2.61 MIL/uL — ABNORMAL LOW (ref 3.87–5.11)
RDW: 13.8 % (ref 11.5–15.5)
WBC: 5.4 10*3/uL (ref 4.0–10.5)
nRBC: 0 % (ref 0.0–0.2)

## 2023-04-11 LAB — COMPREHENSIVE METABOLIC PANEL
ALT: 13 U/L (ref 0–44)
AST: 23 U/L (ref 15–41)
Albumin: 3.2 g/dL — ABNORMAL LOW (ref 3.5–5.0)
Alkaline Phosphatase: 81 U/L (ref 38–126)
Anion gap: 18 — ABNORMAL HIGH (ref 5–15)
BUN: 15 mg/dL (ref 8–23)
CO2: 26 mmol/L (ref 22–32)
Calcium: 8.1 mg/dL — ABNORMAL LOW (ref 8.9–10.3)
Chloride: 90 mmol/L — ABNORMAL LOW (ref 98–111)
Creatinine, Ser: 3.06 mg/dL — ABNORMAL HIGH (ref 0.44–1.00)
GFR, Estimated: 16 mL/min — ABNORMAL LOW (ref >60)
Glucose, Bld: 108 mg/dL — ABNORMAL HIGH (ref 70–99)
Potassium: 3.3 mmol/L — ABNORMAL LOW (ref 3.5–5.1)
Sodium: 134 mmol/L — ABNORMAL LOW (ref 135–145)
Total Bilirubin: 1 mg/dL (ref 0.3–1.2)
Total Protein: 7.7 g/dL (ref 6.5–8.1)

## 2023-04-11 LAB — CBG MONITORING, ED: Glucose-Capillary: 86 mg/dL (ref 70–99)

## 2023-04-11 LAB — TROPONIN I (HIGH SENSITIVITY)
Troponin I (High Sensitivity): 9 ng/L (ref <18)
Troponin I (High Sensitivity): 11 ng/L (ref <18)

## 2023-04-11 LAB — SARS CORONAVIRUS 2 BY RT PCR: SARS Coronavirus 2 by RT PCR: NEGATIVE

## 2023-04-11 LAB — PROTIME-INR
INR: 1.5 — ABNORMAL HIGH (ref 0.8–1.2)
Prothrombin Time: 17.9 s — ABNORMAL HIGH (ref 11.4–15.2)

## 2023-04-11 LAB — LACTIC ACID, PLASMA
Lactic Acid, Venous: 3.4 mmol/L (ref 0.5–1.9)
Lactic Acid, Venous: 3.4 mmol/L (ref 0.5–1.9)

## 2023-04-11 MED ORDER — METOPROLOL TARTRATE 25 MG PO TABS
25.0000 mg | ORAL_TABLET | Freq: Two times a day (BID) | ORAL | Status: AC
  Administered 2023-04-11 – 2023-04-18 (×10): 25 mg via ORAL
  Filled 2023-04-11 (×12): qty 1

## 2023-04-11 MED ORDER — LACTATED RINGERS IV BOLUS
1000.0000 mL | Freq: Once | INTRAVENOUS | Status: AC
  Administered 2023-04-11: 1000 mL via INTRAVENOUS

## 2023-04-11 MED ORDER — HEPARIN BOLUS VIA INFUSION
3000.0000 [IU] | Freq: Once | INTRAVENOUS | Status: AC
  Administered 2023-04-11: 3000 [IU] via INTRAVENOUS
  Filled 2023-04-11: qty 3000

## 2023-04-11 MED ORDER — ACETAMINOPHEN 650 MG RE SUPP: 650.0000 mg | Freq: Four times a day (QID) | RECTAL | Status: DC | PRN

## 2023-04-11 MED ORDER — ACETAMINOPHEN 325 MG PO TABS
650.0000 mg | ORAL_TABLET | Freq: Four times a day (QID) | ORAL | Status: DC | PRN
  Administered 2023-04-11 – 2023-04-19 (×15): 650 mg via ORAL
  Filled 2023-04-11 (×15): qty 2

## 2023-04-11 MED ORDER — ONDANSETRON HCL 4 MG PO TABS: 4.0000 mg | ORAL_TABLET | Freq: Four times a day (QID) | ORAL | Status: DC | PRN

## 2023-04-11 MED ORDER — SODIUM CHLORIDE 0.9 % IV BOLUS: 500.0000 mL | Freq: Once | INTRAVENOUS | Status: DC

## 2023-04-11 MED ORDER — ACETAMINOPHEN 500 MG PO TABS
1000.0000 mg | ORAL_TABLET | Freq: Once | ORAL | Status: AC
  Administered 2023-04-11: 1000 mg via ORAL
  Filled 2023-04-11: qty 2

## 2023-04-11 MED ORDER — SODIUM CHLORIDE 0.9 % IV BOLUS
250.0000 mL | Freq: Once | INTRAVENOUS | Status: AC
  Administered 2023-04-11: 250 mL via INTRAVENOUS

## 2023-04-11 MED ORDER — HEPARIN (PORCINE) 25000 UT/250ML-% IV SOLN
1000.0000 [IU]/h | INTRAVENOUS | Status: DC
  Administered 2023-04-11: 850 [IU]/h via INTRAVENOUS
  Filled 2023-04-11: qty 250

## 2023-04-11 MED ORDER — ALBUTEROL SULFATE (2.5 MG/3ML) 0.083% IN NEBU: 2.5000 mg | INHALATION_SOLUTION | RESPIRATORY_TRACT | Status: DC | PRN

## 2023-04-11 MED ORDER — DILTIAZEM HCL 25 MG/5ML IV SOLN
10.0000 mg | Freq: Once | INTRAVENOUS | Status: AC
  Administered 2023-04-11: 10 mg via INTRAVENOUS
  Filled 2023-04-11: qty 5

## 2023-04-11 MED ORDER — INSULIN ASPART 100 UNIT/ML IJ SOLN: 0.0000 [IU] | Freq: Three times a day (TID) | INTRAMUSCULAR | Status: DC

## 2023-04-11 MED ORDER — ONDANSETRON HCL 4 MG/2ML IJ SOLN: 4.0000 mg | Freq: Four times a day (QID) | INTRAMUSCULAR | Status: DC | PRN

## 2023-04-11 NOTE — ED Notes (Signed)
Admitting MD returned page, stated she will follow up. Awaiting new orders.

## 2023-04-11 NOTE — Progress Notes (Signed)
PHARMACY - ANTICOAGULATION CONSULT NOTE  Pharmacy Consult for heparin  Indication: atrial fibrillation  No Known Allergies  Patient Measurements:   Heparin Dosing Weight: 60.1kg   Vital Signs: Temp: 98.3 F (36.8 C) (10/09 1405) Temp Source: Oral (10/09 0932) BP: 131/68 (10/09 1200) Pulse Rate: 81 (10/09 1200)  Labs: Recent Labs    04/11/23 0951 04/11/23 1200  HGB 8.8*  --   HCT 26.0*  --   PLT 126*  --   LABPROT 17.9*  --   INR 1.5*  --   CREATININE 3.06*  --   TROPONINIHS 9 11    CrCl cannot be calculated (Unknown ideal weight.).   Medical History: Past Medical History:  Diagnosis Date   Anemia    Cirrhosis (HCC)    Diabetes mellitus without complication (HCC)    ESRD (end stage renal disease) on dialysis (HCC)    Hypertension    Type 2 diabetes mellitus (HCC)    Vision loss     Assessment: Patient admitted with CC of afib with RVR. Hx of complete heart block s/p PPM placement. Not on anticoagulation PTA. HgB 8.8 and PLTs 126. Pharmacy consulted to dose heparin gtt.   Goal of Therapy:  Heparin level 0.3-0.7 units/ml Monitor platelets by anticoagulation protocol: Yes   Plan:  Give 3000 units bolus x 1 Start heparin infusion at 850 units/hr Check anti-Xa level in 8 hours and daily while on heparin Continue to monitor H&H and platelets  Estill Batten, PharmD, BCCCP  04/11/2023,3:14 PM

## 2023-04-11 NOTE — ED Notes (Signed)
Dining called again due to dinner tray not being delivered.   Reordered and should be delivered in 45 minutes.

## 2023-04-11 NOTE — H&P (View-Only) (Signed)
Cardiology Consultation   Patient ID: Stacey Chambers MRN: [id number]; DOB: 1951/01/03  Admit date: 04/11/2023 Date of Consult: 04/11/2023  PCP:  Corliss Blacker, MD   [facility] Providers Cardiologist:  Maisie Fus, MD        Patient Profile:   Stacey Chambers is a 72 y.o. female with a hx of ESRD, alcohol cirrhosis, hypertension, DM, anemia, complete heart block s/p leadless PPM who is being seen 04/11/2023 for the evaluation of paroxysmal afib at the request of Dr. Posey Rea and Evlyn Kanner PA-C.  History of Present Illness:  HPI obtained via translator assistance. Patient poor historian.   Stacey Chambers was brought to the ED from dialysis today after she developed afib with RVR. Patient reports that she had symptoms of palpitations, chest discomfort, and shortness of breath. This was the first time she noted rapid HR symptoms. Additionally today she also reports some upper abdominal discomfort/abdominal tenderness. Patient reports orthopnea and malaise over the past couple of weeks since receiving her flu shot. In the ED, portable CXR noted cardiomegaly prompting bedside echo. Per notes, ED providers noted a pericardial effusion that subjectively appeared enlarged in comparison to prior echo in March of this year. Given new arrhythmia and possible expanding pericardial effusion, cardiology consulted.   At the time of cardiology exam in the ED, patient noted to be in sinus rhythm with demand pacing. She reports significant improvement in chest discomfort, no longer having palpitations. Telemetry also appears to show a rate related RBBB. Patient continues to have epigastric discomfort and is tender to palpation.    Past Medical History:  Diagnosis Date   Anemia    Cirrhosis (HCC)    Diabetes mellitus without complication (HCC)    ESRD (end stage renal disease) on dialysis (HCC)    Hypertension    Type 2 diabetes mellitus (HCC)    Vision loss      Past Surgical History:  Procedure Laterality Date   AV FISTULA PLACEMENT Left 12/23/2021   Procedure: INSERTION OF LEFT ARM BRACHIOCEPHALIC ARTERIOVENOUS FISTULA;  Surgeon: Cephus Shelling, MD;  Location: [facility];  Service: Vascular;  Laterality: Left;   BACK SURGERY     COLONOSCOPY     IR FLUORO GUIDE CV LINE RIGHT  12/16/2021   IR US GUIDE VASC ACCESS RIGHT  12/16/2021   PACEMAKER LEADLESS INSERTION N/A 01/15/2023   Procedure: PACEMAKER LEADLESS INSERTION;  Surgeon: Lanier Prude, MD;  [facility]: [facility];  Service: Cardiovascular;  Laterality: N/A;   TEMPORARY PACEMAKER N/A 01/13/2023   Procedure: TEMPORARY PACEMAKER;  Surgeon: Orbie Pyo, MD;  [facility]: [facility];  Service: Cardiovascular;  Laterality: N/A;   UPPER GASTROINTESTINAL ENDOSCOPY       Home Medications:  Prior to Admission medications   Medication Sig Start Date End Date Taking? Authorizing Provider  acetaminophen (TYLENOL) 500 MG tablet Take 500 mg by mouth every 8 (eight) hours as needed for moderate pain.    [provider]  albuterol (VENTOLIN HFA) 108 (90 Base) MCG/ACT inhaler Inhale into the lungs. 12/06/22   [provider]  amLODipine (NORVASC) 5 MG tablet Take 1 tablet (5 mg total) by mouth daily. 01/18/23   Catarina Hartshorn, MD  guaiFENesin (ROBITUSSIN) 100 MG/5ML liquid Take 5 mLs by mouth every 4 (four) hours as needed for cough or to loosen phlegm. 10/27/22   Willeen Niece, MD  hydrALAZINE (APRESOLINE) 50 MG tablet Take 1 tablet (50 mg total) by mouth  3 (three) times daily. 12/19/22 03/19/23  Maisie Fus, MD  lidocaine-prilocaine (EMLA) cream Apply 1 Application topically once. 12/19/22   [provider]  losartan (COZAAR) 100 MG tablet Take 100 mg by mouth daily. 12/06/22   [provider]  mirtazapine (REMERON) 15 MG tablet Take 15 mg by mouth at bedtime. 10/10/22   [provider]  sevelamer carbonate (RENVELA) 800 MG tablet Take 1 tablet (800  mg total) by mouth 3 (three) times daily with meals. 10/27/22   Willeen Niece, MD    Inpatient Medications: Scheduled Meds:  metoprolol tartrate  25 mg Oral BID   Continuous Infusions:  PRN Meds:   Allergies:   No Known Allergies  Social History:   Social History   Socioeconomic History   Marital status: Married    Spouse name: Not on file   Number of children: Not on file   Years of education: Not on file   Highest education level: Not on file  Occupational History   Not on file  Tobacco Use   Smoking status: Never   Smokeless tobacco: Never  Vaping Use   Vaping status: Never Used  Substance and Sexual Activity   Alcohol use: Never    Comment: once in awhile   Drug use: Not Currently    Types: Marijuana    Comment: Use of THC tea 1-2 times per week   Sexual activity: Not on file  Other Topics Concern   Not on file  Social History Narrative   Not on file   Social Determinants of Health   Financial Resource Strain: Not on file  Food Insecurity: No Food Insecurity (01/16/2023)   Hunger Vital Sign    Worried About Running Out of Food in the Last Year: Never true    Ran Out of Food in the Last Year: Never true  Transportation Needs: No Transportation Needs (01/16/2023)   PRAPARE - Administrator, Civil Service (Medical): No    Lack of Transportation (Non-Medical): No  Physical Activity: Not on file  Stress: Not on file  Social Connections: Unknown (09/14/2022)   [facility] from [facility]   Social Network    Social Network: Not on file  Intimate Partner Violence: Not At Risk (01/16/2023)   Humiliation, Afraid, Rape, and Kick questionnaire    Fear of Current or Ex-Partner: No    Emotionally Abused: No    Physically Abused: No    Sexually Abused: No    Family History:    Family History  Problem Relation Age of Onset   Diabetes Brother    Colon cancer Neg Hx    Esophageal cancer Neg Hx    Stomach cancer Neg Hx    Pancreatic  cancer Neg Hx    Inflammatory bowel disease Neg Hx    Liver disease Neg Hx    Rectal cancer Neg Hx    Colon polyps Neg Hx      ROS:  Please see the history of present illness.   All other ROS reviewed and negative.     Physical Exam/Data:   Vitals:   04/11/23 1103 04/11/23 1104 04/11/23 1200 04/11/23 1405  BP:   131/68   Pulse: 86 85 81   Resp: 17 19 20    Temp:    98.3 F (36.8 C)  TempSrc:      SpO2: 97% 96% 94%     Intake/Output Summary (Last 24 hours) at 04/11/2023 1456 Last data filed at 04/11/2023 1024  Gross per 24 hour  Intake 100 ml  Output --  Net 100 ml      03/20/2023    9:22 AM 01/17/2023    4:02 AM 01/16/2023    4:00 AM  Last 3 Weights  Weight (lbs) 142 lb 12.8 oz 135 lb 2.3 oz 134 lb 11.2 oz  Weight (kg) 64.774 kg 61.3 kg 61.1 kg     There is no height or weight on file to calculate BMI.  General:  Well nourished, well developed, in no acute distress HEENT: normal Neck: JVD to mandible with HOB at 45 degrees. Positive hepatojugular reflex Vascular: No carotid bruits; Distal pulses 2+ bilaterally Cardiac:  normal S1, S2; RRR; no murmur  Lungs:  bibasilar crackles Abd: epigastric tenderness to palpation. No rebound tenderness Ext: no edema Musculoskeletal:  No deformities, BUE and BLE strength normal and equal Skin: warm and dry  Neuro:  CNs 2-12 intact, no focal abnormalities noted Psych:  Normal affect   EKG:  Initial ECG with afib, RVR at 130 with RBBB morphology. Subsequent tracing with NSR and RBBB Telemetry:  Telemetry was personally reviewed and demonstrates:  initial telemetry with afib/RVR. Spontaneous conversion following diltiazem bolus. Initially with demand pacing but then with sinus rhythm with RBBB morphology.  Relevant CV Studies:  09/16/22 TTE  IMPRESSIONS     1. Left ventricular ejection fraction, by estimation, is 60 to 65%. The  left ventricle has normal function. The left ventricle has no regional  wall motion  abnormalities. There is mild left ventricular hypertrophy.  Left ventricular diastolic parameters  are indeterminate.   2. Right ventricular systolic function is normal. The right ventricular  size is normal. There is mildly elevated pulmonary artery systolic  pressure. The estimated right ventricular systolic pressure is 41.9 mmHg.   3. Left atrial size was moderately dilated.   4. A small pericardial effusion is present. The pericardial effusion is  circumferential. There is no evidence of cardiac tamponade.   5. The mitral valve is normal in structure. Mild mitral valve  regurgitation. No evidence of mitral stenosis.   6. Tricuspid valve regurgitation is moderate.   7. The aortic valve is calcified. There is mild calcification of the  aortic valve. There is mild thickening of the aortic valve. Aortic valve  regurgitation is not visualized. Mild aortic valve stenosis. Aortic valve  area, by VTI measures 2.07 cm.  Aortic valve mean gradient measures 10.0 mmHg. Aortic valve Vmax measures  2.16 m/s.   8. The inferior vena cava is normal in size with <50% respiratory  variability, suggesting right atrial pressure of 8 mmHg.   Laboratory Data:  High Sensitivity Troponin:   Recent Labs  Lab 04/11/23 0951 04/11/23 1200  TROPONINIHS 9 11     Chemistry Recent Labs  Lab 04/11/23 0951  NA 134*  K 3.3*  CL 90*  CO2 26  GLUCOSE 108*  BUN 15  CREATININE 3.06*  CALCIUM 8.1*  GFRNONAA 16*  ANIONGAP 18*    Recent Labs  Lab 04/11/23 0951  PROT 7.7  ALBUMIN 3.2*  AST 23  ALT 13  ALKPHOS 81  BILITOT 1.0   Lipids No results for input(s): "CHOL", "TRIG", "HDL", "LABVLDL", "LDLCALC", "CHOLHDL" in the last 168 hours.  Hematology Recent Labs  Lab 04/11/23 0951  WBC 5.4  RBC 2.61*  HGB 8.8*  HCT 26.0*  MCV 99.6  MCH 33.7  MCHC 33.8  RDW 13.8  PLT 126*   Thyroid No results for input(s): "TSH", "  FREET4" in the last 168 hours.  BNPNo results for input(s): "BNP", "PROBNP"  in the last 168 hours.  DDimer No results for input(s): "DDIMER" in the last 168 hours.   Radiology/Studies:  DG Chest Port 1 View  Result Date: 04/11/2023 CLINICAL DATA:  Chest pain EXAM: PORTABLE CHEST 1 VIEW COMPARISON:  01/16/2023 FINDINGS: Enlarged cardiopericardial silhouette. Loop recorder. Small pleural effusions. Vascular congestion with some peribronchial thickening. No pneumothorax. Overlapping cardiac leads. IMPRESSION: Enlarged heart with some vascular congestion and peribronchial thickening. Small pleural effusions. Electronically Signed   By: Karen Kays M.D.   On: 04/11/2023 10:53     Assessment and Plan:   Atrial fibrillation Hx CHB s/p leadless PPM  Patient with history of CHB leading to placement of micra device earlier this year developed first documented occurrence of afib during dialysis session today. Fortunately now back in sinus rhythm following diltiazem bolus.   Patient with resolution of palpitations/chest discomfort following conversion. Will plan to add Metoprolol Tartrate 25mg  BID for rate control. Afib may be correlated with suspected progression of pericardial effusion. Patient will need anticoagulation, would wait on DOAC until after she has echocardiogram to assess effusion. For now, will use heparin.   Pericardial effusion  Patient with previously noted small circumferential pericardial effusion on March TTE. In the ED today CXR concerning for increased cardiomegaly. Per ED noted, bedside echo concerning for interval size increase in effusion.   On exam, patient hemodynamically stable/no signs of tamponade. Will check formal echocardiogram. Hold DOAC until effusion evaluated, will bridge with heparin for now. Management of effusion to be dictated by echo.   HFpEF  Last echocardiogram from 03/24 with LVEF 60-65%, no RWMA. Patient with evidence of significant vascular congestion on physical exam, JVP to mandible and bibasilar crackles noted. Reports  increase in orthopnea and general dyspnea over the last couple of weeks.   Would benefit from make up dialysis session. Suspect that some of her epigastric discomfort may be secondary to vascular congestion. GDMT limited by ESRD  Per primary team: DM ESRD Anemia of chronic disease Hypokalemia  Risk Assessment/Risk Scores:        New York Heart Association (NYHA) Functional Class NYHA Class III  CHA2DS2-VASc Score = 5   This indicates a 7.2% annual risk of stroke. The patient's score is based upon: CHF History: 1 HTN History: 1 Diabetes History: 1 Stroke History: 0 Vascular Disease History: 0 Age Score: 1 Gender Score: 1         For questions or updates, please contact [facility] Please consult [email or web address] for contact info under    Signed, Perlie Gold, PA-C  04/11/2023 2:56 PM   ATTENDING ATTESTATION  I have seen, examined and evaluated the patient this afternoon in the ER along with Perlie Gold, PA.  After reviewing all the available data and chart, we discussed the patients laboratory, study & physical findings as well as symptoms in detail.  I agree with his findings, examination as well as impression recommendations as per our discussion.     Interview completed using the video interpreter but was still very difficult because her hearing was not great. She was at dialysis today and was feeling poorly indicating that she felt like the nurse did not know what she was doing.  She started feeling short of breath and had some discomfort in her chest and apparently was found to be in A-fib RVR.  Upon arrival to the ER she was given IV diltiazem and spontaneously converted  from A-fib to sinus rhythm, and remains in sinus rhythm now.  Chest x-ray showed cardiomegaly and apparently bedside echo showed pericardial effusion.  We were consulted for the A-fib but also the pericardial effusion. Echo pending, but sounds do not sound distant and she is not  having any signs or symptoms to suggest epicardial tamponade.  I suspect this could be potentially uremic effusion related to ESRD and not having had full dialysis.  Until we are sure of the presence or absence of effusion, significance of it etc., would not use long-acting anticoagulation, will start off with IV heparin until echo was performed and we can see.  Would then probably consider converting to DOAC on discharge (Eliquis).  Will start low-dose beta-blocker for rate control.    She does have some notable JVD with HJR likely related to volume overload.  She does seem a little bit volume overloaded which makes sense based on the fact that she has not completed dialysis..  Previous EF of been normal. .     Marykay Lex, MD, MS Bryan Lemma, M.D., M.S. Interventional Cardiologist  [facility]  Pager # [phone] Phone # [phone] [address] [place], [place] [address]

## 2023-04-11 NOTE — ED Notes (Signed)
Patient stating that her neck feels like its swollen, RN notified admitting MD.

## 2023-04-11 NOTE — H&P (Addendum)
History and Physical    Stacey Chambers VHQ:[id number] DOB: 1951/03/17 DOA: 04/11/2023  PCP: Corliss Blacker, MD  Patient coming from: HD  I have personally briefly reviewed patient's old medical records in [facility]  Chief Complaint:  palpitations/sob/cp  HPI: Stacey Chambers is a 72 y.o. female with medical history significant of ESRD TTS, Cirrhosis, status post pacemaker due to complete heart block DMII, Hypertension  who present to ED BIB ems after while on HD patient started to have increase heart rate to 130's. Patient also has associated sob and chest pain as well.  Patient also had further complaints of flu like symptoms since getting her flu shot 2 weeks ago. She notes sore throat, fatigue and poor appetite as well as belly pain.  Patient currently states in ED she feels much improved other that she has a HA and still has feeling of sore throat and  as well as epigastric pain. She also noted  chronic lower back pain. She also noted chills and fever at home.   ED Course:  In ED on evaluation  vitalsBP 97/72  Pulse 85  Temp 98.2 F (36.8 C) (Oral)  Resp 19  SpO2 96%   Afib with RBBB and LPFB 140  Patient tx with cardizem iv bolus with conversion to sinus EKG: snr RBBB  Labs Wbc 5.4, hgb 8.8 (9.8), plt 126 INR 1.5 NA 134, K 3.3, CL90, cr 3.06 Resp panel neg CxrIMPRESSION: Enlarged heart with some vascular congestion and peribronchial thickening. Small pleural effusions.  tylenol , heparin drip  Tx cardizem 10,  LR 1L  Review of Systems: As per HPI otherwise 10 point review of systems negative.   Past Medical History:  Diagnosis Date   Anemia    Cirrhosis (HCC)    Diabetes mellitus without complication (HCC)    ESRD (end stage renal disease) on dialysis (HCC)    Hypertension    Type 2 diabetes mellitus (HCC)    Vision loss     Past Surgical History:  Procedure Laterality Date   AV FISTULA PLACEMENT Left 12/23/2021   Procedure:  INSERTION OF LEFT ARM BRACHIOCEPHALIC ARTERIOVENOUS FISTULA;  Surgeon: Cephus Shelling, MD;  Location: [facility] OR;  Service: Vascular;  Laterality: Left;   BACK SURGERY     COLONOSCOPY     IR FLUORO GUIDE CV LINE RIGHT  12/16/2021   IR US GUIDE VASC ACCESS RIGHT  12/16/2021   PACEMAKER LEADLESS INSERTION N/A 01/15/2023   Procedure: PACEMAKER LEADLESS INSERTION;  Surgeon: Lanier Prude, MD;  Location: [facility] INVASIVE CV LAB;  Service: Cardiovascular;  Laterality: N/A;   TEMPORARY PACEMAKER N/A 01/13/2023   Procedure: TEMPORARY PACEMAKER;  Surgeon: Orbie Pyo, MD;  Location: [facility] INVASIVE CV LAB;  Service: Cardiovascular;  Laterality: N/A;   UPPER GASTROINTESTINAL ENDOSCOPY       reports that she has never smoked. She has never used smokeless tobacco. She reports that she does not currently use drugs after having used the following drugs: Marijuana. She reports that she does not drink alcohol.  No Known Allergies  Family History  Problem Relation Age of Onset   Diabetes Brother    Colon cancer Neg Hx    Esophageal cancer Neg Hx    Stomach cancer Neg Hx    Pancreatic cancer Neg Hx    Inflammatory bowel disease Neg Hx    Liver disease Neg Hx    Rectal cancer Neg Hx    Colon polyps Neg Hx  Prior to Admission medications   Medication Sig Start Date End Date Taking? Authorizing Provider  acetaminophen (TYLENOL) 500 MG tablet Take 500 mg by mouth every 8 (eight) hours as needed for moderate pain.    [provider]  albuterol (VENTOLIN HFA) 108 (90 Base) MCG/ACT inhaler Inhale into the lungs. 12/06/22   [provider]  amLODipine (NORVASC) 5 MG tablet Take 1 tablet (5 mg total) by mouth daily. 01/18/23   Catarina Hartshorn, MD  guaiFENesin (ROBITUSSIN) 100 MG/5ML liquid Take 5 mLs by mouth every 4 (four) hours as needed for cough or to loosen phlegm. 10/27/22   Willeen Niece, MD  hydrALAZINE (APRESOLINE) 50 MG tablet Take 1 tablet (50 mg total) by mouth 3 (three) times daily.  12/19/22 03/19/23  Maisie Fus, MD  lidocaine-prilocaine (EMLA) cream Apply 1 Application topically once. 12/19/22   [provider]  losartan (COZAAR) 100 MG tablet Take 100 mg by mouth daily. 12/06/22   [provider]  mirtazapine (REMERON) 15 MG tablet Take 15 mg by mouth at bedtime. 10/10/22   [provider]  sevelamer carbonate (RENVELA) 800 MG tablet Take 1 tablet (800 mg total) by mouth 3 (three) times daily with meals. 10/27/22   Willeen Niece, MD    Physical Exam: Vitals:   04/11/23 1730 04/11/23 1800 04/11/23 1830 04/11/23 1850  BP: 113/65 113/75 115/67   Pulse: 69 66 66   Resp:   18   Temp:    98.5 F (36.9 C)  TempSrc:      SpO2: 97% 100% 98%   Weight:      Height:        Constitutional: NAD, calm, comfortable Vitals:   04/11/23 1730 04/11/23 1800 04/11/23 1830 04/11/23 1850  BP: 113/65 113/75 115/67   Pulse: 69 66 66   Resp:   18   Temp:    98.5 F (36.9 C)  TempSrc:      SpO2: 97% 100% 98%   Weight:      Height:       Eyes: PERRL, lids and conjunctivae normal ENMT: Mucous membranes are moist.  Respiratory: clear to auscultation bilaterally, no wheezing, no crackles. Normal respiratory effort. No accessory muscle use.  Cardiovascular: Regular rate and rhythm, no murmurs / rubs / gallops. No extremity edema. 2+ pedal pulses.  Abdomen: no tenderness, no masses palpated. No hepatosplenomegaly. Bowel sounds positive.  Musculoskeletal: no clubbing / cyanosis. No joint deformity upper and lower extremities. Good ROM, no contractures. Normal muscle tone.  Skin: no rashes, lesions, ulcers. No induration Neurologic: CN 2-12 grossly intact. Sensation intact. Strength 5/5 in all 4.  Psychiatric: Normal judgment and insight. Alert and oriented x 3. Normal mood.    Labs on Admission: I have personally reviewed following labs and imaging studies  CBC: Recent Labs  Lab 04/11/23 0951  WBC 5.4  HGB 8.8*  HCT 26.0*  MCV 99.6  PLT 126*    Basic Metabolic Panel: Recent Labs  Lab 04/11/23 0951  NA 134*  K 3.3*  CL 90*  CO2 26  GLUCOSE 108*  BUN 15  CREATININE 3.06*  CALCIUM 8.1*   GFR: Estimated Creatinine Clearance: 14.5 mL/min (A) (by C-G formula based on SCr of 3.06 mg/dL (H)). Liver Function Tests: Recent Labs  Lab 04/11/23 0951  AST 23  ALT 13  ALKPHOS 81  BILITOT 1.0  PROT 7.7  ALBUMIN 3.2*   No results for input(s): "LIPASE", "AMYLASE" in the last 168 hours. No results for  input(s): "AMMONIA" in the last 168 hours. Coagulation Profile: Recent Labs  Lab 04/11/23 0951  INR 1.5*   Cardiac Enzymes: No results for input(s): "CKTOTAL", "CKMB", "CKMBINDEX", "TROPONINI" in the last 168 hours. BNP (last 3 results) No results for input(s): "PROBNP" in the last 8760 hours. HbA1C: No results for input(s): "HGBA1C" in the last 72 hours. CBG: Recent Labs  Lab 04/11/23 1710  GLUCAP 86   Lipid Profile: No results for input(s): "CHOL", "HDL", "LDLCALC", "TRIG", "CHOLHDL", "LDLDIRECT" in the last 72 hours. Thyroid Function Tests: No results for input(s): "TSH", "T4TOTAL", "FREET4", "T3FREE", "THYROIDAB" in the last 72 hours. Anemia Panel: No results for input(s): "VITAMINB12", "FOLATE", "FERRITIN", "TIBC", "IRON", "RETICCTPCT" in the last 72 hours. Urine analysis:    Component Value Date/Time   COLORURINE STRAW (A) 09/16/2022 0730   APPEARANCEUR CLEAR 09/16/2022 0730   LABSPEC 1.006 09/16/2022 0730   PHURINE 9.0 (H) 09/16/2022 0730   GLUCOSEU 50 (A) 09/16/2022 0730   HGBUR MODERATE (A) 09/16/2022 0730   BILIRUBINUR NEGATIVE 09/16/2022 0730   KETONESUR NEGATIVE 09/16/2022 0730   PROTEINUR 100 (A) 09/16/2022 0730   NITRITE NEGATIVE 09/16/2022 0730   LEUKOCYTESUR NEGATIVE 09/16/2022 0730    Radiological Exams on Admission: DG Chest Port 1 View  Result Date: 04/11/2023 CLINICAL DATA:  Chest pain EXAM: PORTABLE CHEST 1 VIEW COMPARISON:  01/16/2023 FINDINGS: Enlarged cardiopericardial  silhouette. Loop recorder. Small pleural effusions. Vascular congestion with some peribronchial thickening. No pneumothorax. Overlapping cardiac leads. IMPRESSION: Enlarged heart with some vascular congestion and peribronchial thickening. Small pleural effusions. Electronically Signed   By: Karen Kays M.D.   On: 04/11/2023 10:53    EKG: Independently reviewed. See above  Assessment/Plan  Atrial fibrillation with RVR -now converted back to sinus s/p 10 of iv cardizem  -started on heparin / and metoprolol bb  by cardiology  -echo currently pending. -concern afib related to increasing pericardial effusion  Pericardial Effusion -echo pending - await further cardiac recs   Hfpef -fluid management with HD -renal will follow in am   Hypertension -stable  -resume home regimen with  cozaar,hydralazine and amlodipine  DMII -iss/fs   Anemia  -continue to monitor , slight drop  -anemia labs   Hypokalemia -repeat labs  -replete judiciously   Flu like symptoms  -s/p vaccine - will do full resp panel -cxr unrevealing /CT chest  -f/u on inflammatory markers/lactic acid -supportive care   DVT prophylaxis: heparin Code Status: full/ as discussed per patient wishes in event of cardiac arrest  Family Communication: none at bedside Disposition Plan: patient  expected to be admitted greater than 2 midnights  Consults called: Cardiology Dr Herbie [place] Admission status: progressive care   Lurline Del MD [facility]   If 7PM-7AM, please contact night-coverage [email or web address] Password [facility]  04/11/2023, 7:35 PM

## 2023-04-11 NOTE — ED Notes (Signed)
Dining services called to confirm dinner tray for patient. Dining staff stated that the tray was ordered and will be delivered shortly.

## 2023-04-11 NOTE — ED Triage Notes (Signed)
Pt bib ems from dialysis. During dialysis pts HR jumped up to 130bpm. Pt also c.o epigastric pain, cough, dry throat and poor appetite since getting her flu shot about 2 weeks ago. Pt received 2 hours of her dialysis treatment  Pt HR ranging from 130-175 BP 110/80 CBG 86 Pt on 2L [place] at baseline Pt arrives to ED a/o HR 130s

## 2023-04-11 NOTE — ED Provider Notes (Signed)
[place] EMERGENCY DEPARTMENT AT [facility] Provider Note   CSN: [id number] Arrival date & time: 04/11/23  6578     History  Chief Complaint  Patient presents with   Tachycardia   Chest Pain    Houston Surges is a 72 y.o. 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  Patient was at dialysis when she began to have tachycardia in the 140s with chest pain and epigastric pain.  Patient is she is mildly short of breath as well.  Patient denies history of A-fib but was noted to be in A-fib at dialysis.  Patient is on blood thinners and denies being on beta-blockers or calcium channel blockers.  Patient denies her pacemaker going off.  Patient denies hematemesis, jaundice, nausea vomiting, dysuria, constipation, diarrhea, left arm pain, back pain, vision changes.  Last echo: 09/16/2022 LVEF 60-65%, small pericardial effusion without tamponade, moderate tricuspid regurgitation, mildly elevated pulmonary arterial pressure, mild aortic stenosis  [name] interpreter [phone]  Home Medications Prior to Admission medications   Medication Sig Start Date End Date Taking? Authorizing Provider  acetaminophen (TYLENOL) 500 MG tablet Take 500 mg by mouth every 8 (eight) hours as needed for moderate pain.    [provider]  albuterol (VENTOLIN HFA) 108 (90 Base) MCG/ACT inhaler Inhale into the lungs. 12/06/22   [provider]  amLODipine (NORVASC) 5 MG tablet Take 1 tablet (5 mg total) by mouth daily. 01/18/23   Catarina Hartshorn, MD  guaiFENesin (ROBITUSSIN) 100 MG/5ML liquid Take 5 mLs by mouth every 4 (four) hours as needed for cough or to loosen phlegm. 10/27/22   Willeen Niece, MD  hydrALAZINE (APRESOLINE) 50 MG tablet Take 1 tablet (50 mg total) by mouth 3 (three) times daily. 12/19/22 03/19/23  Maisie Fus, MD   lidocaine-prilocaine (EMLA) cream Apply 1 Application topically once. 12/19/22   [provider]  losartan (COZAAR) 100 MG tablet Take 100 mg by mouth daily. 12/06/22   [provider]  mirtazapine (REMERON) 15 MG tablet Take 15 mg by mouth at bedtime. 10/10/22   [provider]  sevelamer carbonate (RENVELA) 800 MG tablet Take 1 tablet (800 mg total) by mouth 3 (three) times daily with meals. 10/27/22   Willeen Niece, MD      Allergies    Patient has no known allergies.    Review of Systems   Review of Systems  Physical Exam Updated Vital Signs BP 97/72   Pulse 85   Temp 98.2 F (36.8 C) (Oral)   Resp 19   SpO2 96%  Physical Exam Vitals reviewed.  Constitutional:      General: She is in acute distress.  HENT:     Head: Normocephalic and atraumatic.  Eyes:     Extraocular Movements: Extraocular movements intact.     Conjunctiva/sclera: Conjunctivae normal.     Pupils: Pupils are equal, round, and reactive to light.  Neck:     Comments: No JVD Cardiovascular:     Rate and Rhythm: Tachycardia present. Rhythm regularly irregular.     Pulses: Normal pulses.     Heart sounds: Normal heart sounds.     Comments: 2+ bilateral radial/dorsalis pedis pulses with increased, irregular rate Pulmonary:     Effort: Pulmonary effort is normal. No respiratory distress.     Breath sounds: Normal breath sounds.  Abdominal:     Palpations:  Abdomen is soft.     Tenderness: There is abdominal tenderness (Epigastric). There is no guarding or rebound.  Musculoskeletal:        General: Normal range of motion.     Cervical back: Normal range of motion and neck supple.     Right lower leg: No edema.     Left lower leg: No edema.     Comments: 5 out of 5 bilateral grip/leg extension strength  Skin:    General: Skin is warm and dry.     Capillary Refill: Capillary refill takes less than 2 seconds.  Neurological:     General: No focal deficit present.     Mental  Status: She is alert and oriented to person, place, and time.     Comments: Sensation intact in all 4 limbs  Psychiatric:        Mood and Affect: Mood normal.     ED Results / Procedures / Treatments   Labs (all labs ordered are listed, but only abnormal results are displayed) Labs Reviewed  CBC - Abnormal; Notable for the following components:      Result Value   RBC 2.61 (*)    Hemoglobin 8.8 (*)    HCT 26.0 (*)    Platelets 126 (*)    All other components within normal limits  PROTIME-INR - Abnormal; Notable for the following components:   Prothrombin Time 17.9 (*)    INR 1.5 (*)    All other components within normal limits  COMPREHENSIVE METABOLIC PANEL - Abnormal; Notable for the following components:   Sodium 134 (*)    Potassium 3.3 (*)    Chloride 90 (*)    Glucose, Bld 108 (*)    Creatinine, Ser 3.06 (*)    Calcium 8.1 (*)    Albumin 3.2 (*)    GFR, Estimated 16 (*)    Anion gap 18 (*)    All other components within normal limits  SARS CORONAVIRUS 2 BY RT PCR  TROPONIN I (HIGH SENSITIVITY)  TROPONIN I (HIGH SENSITIVITY)    EKG None  Radiology DG Chest Port 1 View  Result Date: 04/11/2023 CLINICAL DATA:  Chest pain EXAM: PORTABLE CHEST 1 VIEW COMPARISON:  01/16/2023 FINDINGS: Enlarged cardiopericardial silhouette. Loop recorder. Small pleural effusions. Vascular congestion with some peribronchial thickening. No pneumothorax. Overlapping cardiac leads. IMPRESSION: Enlarged heart with some vascular congestion and peribronchial thickening. Small pleural effusions. Electronically Signed   By: Karen Kays M.D.   On: 04/11/2023 10:53    Procedures .Critical Care  Performed by: Netta Corrigan, PA-C Authorized by: Netta Corrigan, PA-C   Critical care provider statement:    Critical care time (minutes):  40   Critical care time was exclusive of:  Separately billable procedures and treating other patients   Critical care was necessary to treat or prevent  imminent or life-threatening deterioration of the following conditions: New onset A-fib, A-fib with RVR.   Critical care was time spent personally by me on the following activities:  Development of treatment plan with patient or surrogate, discussions with consultants, evaluation of patient's response to treatment, examination of patient, ordering and review of laboratory studies, ordering and review of radiographic studies, ordering and performing treatments and interventions, pulse oximetry, re-evaluation of patient's condition, review of old charts, blood draw for specimens and obtaining history from patient or surrogate   I assumed direction of critical care for this patient from another provider in my specialty: no     Care  discussed with: admitting provider   Ultrasound ED Echo  Date/Time: 04/11/2023 10:42 AM  Performed by: Netta Corrigan, PA-C Authorized by: Netta Corrigan, PA-C   Procedure details:    Indications: chest pain     Views: subxiphoid and parasternal short axis view     Images: archived   Findings:    Pericardium: moderate pericardial effusion     RV Diameter: normal   Impression:    Impression: pericardial effusion present       Medications Ordered in ED Medications  diltiazem (CARDIZEM) injection 10 mg (10 mg Intravenous Given 04/11/23 1003)  lactated ringers bolus 1,000 mL (0 mLs Intravenous Stopped 04/11/23 1024)  acetaminophen (TYLENOL) tablet 1,000 mg (1,000 mg Oral Given 04/11/23 1200)    ED Course/ Medical Decision Making/ A&P                                 Medical Decision Making  Jissell Trafton 72 y.o. presented today for chest pain. Working DDx that I considered at this time includes, but not limited to, ACS, GERD, pulmonary embolism, community-acquired pneumonia, aortic dissection, pneumothorax, underlying bony abnormality, anemia, thyrotoxicosis, esophageal rupture, CHF exacerbation, valvular disorder, myocarditis, pericarditis,  endocarditis, pericardial effusion/cardiac tamponade, pulmonary edema, gastritis/PUD, esophagitis.  R/o Dx: ACS, GERD, pulmonary embolism, community-acquired pneumonia, aortic dissection, pneumothorax, underlying bony abnormality, anemia, thyrotoxicosis, esophageal rupture, CHF exacerbation, valvular disorder, myocarditis, pericarditis, endocarditis, cardiac tamponade, pulmonary edema, gastritis/PUD, esophagitis: These are considered less likely due to history of present illness and physical exam findings. Aortic Dissection: less likely based on the location, quality, onset, and severity of symptoms in this case. Patient also has a lack of underlying history of AD or TAA.   Review of prior external notes: 01/13/2023 discharge summary  Unique Tests and My Interpretation:  CBC: Hemoglobin 8.8 however patient just had dialysis so this would be expected PT/INR: 17.9/1.5 however this is chronic COVID: Negative CMP: Hypokalemia 3.3, creatinine is down from baseline and GFR is mildly improved however patient just had dialysis, anion gap present however this is most likely due to uremia that would have been present before dialysis Troponin: 9 Chest x-ray: Enlarged heart with effusion EKG: A-fib with RVR 131 bpm, right bundle branch block, left anterior fascicular block Repeat EKG: Sinus 82 with right bundle branch block,  Discussion with Independent Historian:  EMS  Discussion of Management of Tests:  Daun Peacock , Cardiology; Maisie Fus, MD Hospitalist  Risk: High: hospitalization or escalation of hospital-level care  Risk Stratification Score: none  Staffed with Kommor, MD  Plan: On exam patient was in distress and noted to be in A-fib RVR in the 140s. Patient's physical was remarkable for mild epigastric tenderness along with the A-fib. Labs and CXR will be ordered.  Patient given diltiazem and anticipate admission for new onset A-fib with RVR.  Patient stable at this time.  Chest x-ray came  back with my independent interpretation does appear to have a pericardial effusion most likely growing from previous echo back in March of earlier this year.  With the attending at bedside echo was performed that does show moderate pericardial effusion without RV collapse.  At this time patient does not require pericardiocentesis and still waiting on the rest of her labs.  Still anticipate admission.  Patient did convert with the diltiazem is now out of A-fib and is in sinus.  Unsure whether or not if pericardial effusion is contributing to  patient's symptoms but once labs are back we will consult cardiology and then admit to the hospitalist.  Pacemaker report came back not showing any activity.  Pacemaker also does not have any leads on it so unable to assess when patient went into A-fib today.  Labs normal.  Will consult cardiology.  I talked to cardio or GI and they stated they will be available for consult and patient is admitted as patient will need formal echo.  Spoke to the hospitalist and patient was accepted by the hospitalist.  Patient stable for admission at this time.  This chart was dictated using voice recognition software.  Despite best efforts to proofread,  errors can occur which can change the documentation meaning.         Final Clinical Impression(s) / ED Diagnoses Final diagnoses:  New onset a-fib (HCC)  Pericardial effusion  Rapid atrial fibrillation [facility])    Rx / DC Orders ED Discharge Orders     None         Remi Deter 04/11/23 1211    Glendora Score, MD 04/11/23 2143

## 2023-04-11 NOTE — ED Notes (Signed)
Admitting MD paged for critical lab values.

## 2023-04-11 NOTE — Consult Note (Addendum)
Cardiology Consultation   Patient ID: Stacey Chambers MRN: [id number]; DOB: 1951/01/03  Admit date: 04/11/2023 Date of Consult: 04/11/2023  PCP:  Corliss Blacker, MD   [facility] Providers Cardiologist:  Maisie Fus, MD        Patient Profile:   Stacey Chambers is a 72 y.o. female with a hx of ESRD, alcohol cirrhosis, hypertension, DM, anemia, complete heart block s/p leadless PPM who is being seen 04/11/2023 for the evaluation of paroxysmal afib at the request of Dr. Posey Rea and Evlyn Kanner PA-C.  History of Present Illness:  HPI obtained via translator assistance. Patient poor historian.   [name] was brought to the ED from dialysis today after she developed afib with RVR. Patient reports that she had symptoms of palpitations, chest discomfort, and shortness of breath. This was the first time she noted rapid HR symptoms. Additionally today she also reports some upper abdominal discomfort/abdominal tenderness. Patient reports orthopnea and malaise over the past couple of weeks since receiving her flu shot. In the ED, portable CXR noted cardiomegaly prompting bedside echo. Per notes, ED providers noted a pericardial effusion that subjectively appeared enlarged in comparison to prior echo in March of this year. Given new arrhythmia and possible expanding pericardial effusion, cardiology consulted.   At the time of cardiology exam in the ED, patient noted to be in sinus rhythm with demand pacing. She reports significant improvement in chest discomfort, no longer having palpitations. Telemetry also appears to show a rate related RBBB. Patient continues to have epigastric discomfort and is tender to palpation.    Past Medical History:  Diagnosis Date   Anemia    Cirrhosis (HCC)    Diabetes mellitus without complication (HCC)    ESRD (end stage renal disease) on dialysis (HCC)    Hypertension    Type 2 diabetes mellitus (HCC)    Vision loss      Past Surgical History:  Procedure Laterality Date   AV FISTULA PLACEMENT Left 12/23/2021   Procedure: INSERTION OF LEFT ARM BRACHIOCEPHALIC ARTERIOVENOUS FISTULA;  Surgeon: Cephus Shelling, MD;  Location: [facility] OR;  Service: Vascular;  Laterality: Left;   BACK SURGERY     COLONOSCOPY     IR FLUORO GUIDE CV LINE RIGHT  12/16/2021   IR US GUIDE VASC ACCESS RIGHT  12/16/2021   PACEMAKER LEADLESS INSERTION N/A 01/15/2023   Procedure: PACEMAKER LEADLESS INSERTION;  Surgeon: Lanier Prude, MD;  Location: [facility] INVASIVE CV LAB;  Service: Cardiovascular;  Laterality: N/A;   TEMPORARY PACEMAKER N/A 01/13/2023   Procedure: TEMPORARY PACEMAKER;  Surgeon: Orbie Pyo, MD;  Location: [facility] INVASIVE CV LAB;  Service: Cardiovascular;  Laterality: N/A;   UPPER GASTROINTESTINAL ENDOSCOPY       Home Medications:  Prior to Admission medications   Medication Sig Start Date End Date Taking? Authorizing Provider  acetaminophen (TYLENOL) 500 MG tablet Take 500 mg by mouth every 8 (eight) hours as needed for moderate pain.    [provider]  albuterol (VENTOLIN HFA) 108 (90 Base) MCG/ACT inhaler Inhale into the lungs. 12/06/22   [provider]  amLODipine (NORVASC) 5 MG tablet Take 1 tablet (5 mg total) by mouth daily. 01/18/23   Catarina Hartshorn, MD  guaiFENesin (ROBITUSSIN) 100 MG/5ML liquid Take 5 mLs by mouth every 4 (four) hours as needed for cough or to loosen phlegm. 10/27/22   Willeen Niece, MD  hydrALAZINE (APRESOLINE) 50 MG tablet Take 1 tablet (50 mg total) by mouth  3 (three) times daily. 12/19/22 03/19/23  Maisie Fus, MD  lidocaine-prilocaine (EMLA) cream Apply 1 Application topically once. 12/19/22   [provider]  losartan (COZAAR) 100 MG tablet Take 100 mg by mouth daily. 12/06/22   [provider]  mirtazapine (REMERON) 15 MG tablet Take 15 mg by mouth at bedtime. 10/10/22   [provider]  sevelamer carbonate (RENVELA) 800 MG tablet Take 1 tablet (800  mg total) by mouth 3 (three) times daily with meals. 10/27/22   Willeen Niece, MD    Inpatient Medications: Scheduled Meds:  metoprolol tartrate  25 mg Oral BID   Continuous Infusions:  PRN Meds:   Allergies:   No Known Allergies  Social History:   Social History   Socioeconomic History   Marital status: Married    Spouse name: Not on file   Number of children: Not on file   Years of education: Not on file   Highest education level: Not on file  Occupational History   Not on file  Tobacco Use   Smoking status: Never   Smokeless tobacco: Never  Vaping Use   Vaping status: Never Used  Substance and Sexual Activity   Alcohol use: Never    Comment: once in awhile   Drug use: Not Currently    Types: Marijuana    Comment: Use of THC tea 1-2 times per week   Sexual activity: Not on file  Other Topics Concern   Not on file  Social History Narrative   Not on file   Social Determinants of Health   Financial Resource Strain: Not on file  Food Insecurity: No Food Insecurity (01/16/2023)   Hunger Vital Sign    Worried About Running Out of Food in the Last Year: Never true    Ran Out of Food in the Last Year: Never true  Transportation Needs: No Transportation Needs (01/16/2023)   PRAPARE - Administrator, Civil Service (Medical): No    Lack of Transportation (Non-Medical): No  Physical Activity: Not on file  Stress: Not on file  Social Connections: Unknown (09/14/2022)   [facility]   Social Network    Social Network: Not on file  Intimate Partner Violence: Not At Risk (01/16/2023)   Humiliation, Afraid, Rape, and Kick questionnaire    Fear of Current or Ex-Partner: No    Emotionally Abused: No    Physically Abused: No    Sexually Abused: No    Family History:    Family History  Problem Relation Age of Onset   Diabetes Brother    Colon cancer Neg Hx    Esophageal cancer Neg Hx    Stomach cancer Neg Hx    Pancreatic  cancer Neg Hx    Inflammatory bowel disease Neg Hx    Liver disease Neg Hx    Rectal cancer Neg Hx    Colon polyps Neg Hx      ROS:  Please see the history of present illness.   All other ROS reviewed and negative.     Physical Exam/Data:   Vitals:   04/11/23 1103 04/11/23 1104 04/11/23 1200 04/11/23 1405  BP:   131/68   Pulse: 86 85 81   Resp: 17 19 20    Temp:    98.3 F (36.8 C)  TempSrc:      SpO2: 97% 96% 94%     Intake/Output Summary (Last 24 hours) at 04/11/2023 1456 Last data filed at 04/11/2023 1024  Gross per 24 hour  Intake 100 ml  Output --  Net 100 ml      03/20/2023    9:22 AM 01/17/2023    4:02 AM 01/16/2023    4:00 AM  Last 3 Weights  Weight (lbs) 142 lb 12.8 oz 135 lb 2.3 oz 134 lb 11.2 oz  Weight (kg) 64.774 kg 61.3 kg 61.1 kg     There is no height or weight on file to calculate BMI.  General:  Well nourished, well developed, in no acute distress HEENT: normal Neck: JVD to mandible with HOB at 45 degrees. Positive hepatojugular reflex Vascular: No carotid bruits; Distal pulses 2+ bilaterally Cardiac:  normal S1, S2; RRR; no murmur  Lungs:  bibasilar crackles Abd: epigastric tenderness to palpation. No rebound tenderness Ext: no edema Musculoskeletal:  No deformities, BUE and BLE strength normal and equal Skin: warm and dry  Neuro:  CNs 2-12 intact, no focal abnormalities noted Psych:  Normal affect   EKG:  Initial ECG with afib, RVR at 130 with RBBB morphology. Subsequent tracing with NSR and RBBB Telemetry:  Telemetry was personally reviewed and demonstrates:  initial telemetry with afib/RVR. Spontaneous conversion following diltiazem bolus. Initially with demand pacing but then with sinus rhythm with RBBB morphology.  Relevant CV Studies:  09/16/22 TTE  IMPRESSIONS     1. Left ventricular ejection fraction, by estimation, is 60 to 65%. The  left ventricle has normal function. The left ventricle has no regional  wall motion  abnormalities. There is mild left ventricular hypertrophy.  Left ventricular diastolic parameters  are indeterminate.   2. Right ventricular systolic function is normal. The right ventricular  size is normal. There is mildly elevated pulmonary artery systolic  pressure. The estimated right ventricular systolic pressure is 41.9 mmHg.   3. Left atrial size was moderately dilated.   4. A small pericardial effusion is present. The pericardial effusion is  circumferential. There is no evidence of cardiac tamponade.   5. The mitral valve is normal in structure. Mild mitral valve  regurgitation. No evidence of mitral stenosis.   6. Tricuspid valve regurgitation is moderate.   7. The aortic valve is calcified. There is mild calcification of the  aortic valve. There is mild thickening of the aortic valve. Aortic valve  regurgitation is not visualized. Mild aortic valve stenosis. Aortic valve  area, by VTI measures 2.07 cm.  Aortic valve mean gradient measures 10.0 mmHg. Aortic valve Vmax measures  2.16 m/s.   8. The inferior vena cava is normal in size with <50% respiratory  variability, suggesting right atrial pressure of 8 mmHg.   Laboratory Data:  High Sensitivity Troponin:   Recent Labs  Lab 04/11/23 0951 04/11/23 1200  TROPONINIHS 9 11     Chemistry Recent Labs  Lab 04/11/23 0951  NA 134*  K 3.3*  CL 90*  CO2 26  GLUCOSE 108*  BUN 15  CREATININE 3.06*  CALCIUM 8.1*  GFRNONAA 16*  ANIONGAP 18*    Recent Labs  Lab 04/11/23 0951  PROT 7.7  ALBUMIN 3.2*  AST 23  ALT 13  ALKPHOS 81  BILITOT 1.0   Lipids No results for input(s): "CHOL", "TRIG", "HDL", "LABVLDL", "LDLCALC", "CHOLHDL" in the last 168 hours.  Hematology Recent Labs  Lab 04/11/23 0951  WBC 5.4  RBC 2.61*  HGB 8.8*  HCT 26.0*  MCV 99.6  MCH 33.7  MCHC 33.8  RDW 13.8  PLT 126*   Thyroid No results for input(s): "TSH", "  FREET4" in the last 168 hours.  BNPNo results for input(s): "BNP", "PROBNP"  in the last 168 hours.  DDimer No results for input(s): "DDIMER" in the last 168 hours.   Radiology/Studies:  DG Chest Port 1 View  Result Date: 04/11/2023 CLINICAL DATA:  Chest pain EXAM: PORTABLE CHEST 1 VIEW COMPARISON:  01/16/2023 FINDINGS: Enlarged cardiopericardial silhouette. Loop recorder. Small pleural effusions. Vascular congestion with some peribronchial thickening. No pneumothorax. Overlapping cardiac leads. IMPRESSION: Enlarged heart with some vascular congestion and peribronchial thickening. Small pleural effusions. Electronically Signed   By: Karen Kays M.D.   On: 04/11/2023 10:53     Assessment and Plan:   Atrial fibrillation Hx CHB s/p leadless PPM  Patient with history of CHB leading to placement of micra device earlier this year developed first documented occurrence of afib during dialysis session today. Fortunately now back in sinus rhythm following diltiazem bolus.   Patient with resolution of palpitations/chest discomfort following conversion. Will plan to add Metoprolol Tartrate 25mg  BID for rate control. Afib may be correlated with suspected progression of pericardial effusion. Patient will need anticoagulation, would wait on DOAC until after she has echocardiogram to assess effusion. For now, will use heparin.   Pericardial effusion  Patient with previously noted small circumferential pericardial effusion on March TTE. In the ED today CXR concerning for increased cardiomegaly. Per ED noted, bedside echo concerning for interval size increase in effusion.   On exam, patient hemodynamically stable/no signs of tamponade. Will check formal echocardiogram. Hold DOAC until effusion evaluated, will bridge with heparin for now. Management of effusion to be dictated by echo.   HFpEF  Last echocardiogram from 03/24 with LVEF 60-65%, no RWMA. Patient with evidence of significant vascular congestion on physical exam, JVP to mandible and bibasilar crackles noted. Reports  increase in orthopnea and general dyspnea over the last couple of weeks.   Would benefit from make up dialysis session. Suspect that some of her epigastric discomfort may be secondary to vascular congestion. GDMT limited by ESRD  Per primary team: DM ESRD Anemia of chronic disease Hypokalemia  Risk Assessment/Risk Scores:        New York Heart Association (NYHA) Functional Class NYHA Class III  CHA2DS2-VASc Score = 5   This indicates a 7.2% annual risk of stroke. The patient's score is based upon: CHF History: 1 HTN History: 1 Diabetes History: 1 Stroke History: 0 Vascular Disease History: 0 Age Score: 1 Gender Score: 1         For questions or updates, please contact [facility] Please consult [email or web address] for contact info under    Signed, Perlie Gold, PA-C  04/11/2023 2:56 PM   ATTENDING ATTESTATION  I have seen, examined and evaluated the patient this afternoon in the ER along with Perlie Gold, PA.  After reviewing all the available data and chart, we discussed the patients laboratory, study & physical findings as well as symptoms in detail.  I agree with his findings, examination as well as impression recommendations as per our discussion.     Interview completed using the video interpreter but was still very difficult because her hearing was not great. She was at dialysis today and was feeling poorly indicating that she felt like the nurse did not know what she was doing.  She started feeling short of breath and had some discomfort in her chest and apparently was found to be in A-fib RVR.  Upon arrival to the ER she was given IV diltiazem and spontaneously converted  from A-fib to sinus rhythm, and remains in sinus rhythm now.  Chest x-ray showed cardiomegaly and apparently bedside echo showed pericardial effusion.  We were consulted for the A-fib but also the pericardial effusion. Echo pending, but sounds do not sound distant and she is not  having any signs or symptoms to suggest epicardial tamponade.  I suspect this could be potentially uremic effusion related to ESRD and not having had full dialysis.  Until we are sure of the presence or absence of effusion, significance of it etc., would not use long-acting anticoagulation, will start off with IV heparin until echo was performed and we can see.  Would then probably consider converting to DOAC on discharge (Eliquis).  Will start low-dose beta-blocker for rate control.    She does have some notable JVD with HJR likely related to volume overload.  She does seem a little bit volume overloaded which makes sense based on the fact that she has not completed dialysis..  Previous EF of been normal. .     Marykay Lex, MD, MS Bryan Lemma, M.D., M.S. Interventional Cardiologist  [facility]  Pager # [phone] Phone # [phone] [address] [place], [place] [address]

## 2023-04-11 NOTE — ED Notes (Addendum)
Error

## 2023-04-12 ENCOUNTER — Other Ambulatory Visit (HOSPITAL_COMMUNITY): Payer: 59

## 2023-04-12 ENCOUNTER — Encounter (HOSPITAL_COMMUNITY)
Admission: EM | Disposition: A | Discharge status: Home / Self Care | Payer: Self-pay | Source: Home / Self Care | Attending: Internal Medicine

## 2023-04-12 ENCOUNTER — Inpatient Hospital Stay (HOSPITAL_COMMUNITY): Payer: 59

## 2023-04-12 DIAGNOSIS — I480 Paroxysmal atrial fibrillation: Secondary | ICD-10-CM | POA: Insufficient documentation

## 2023-04-12 DIAGNOSIS — I5032 Chronic diastolic (congestive) heart failure: Secondary | ICD-10-CM | POA: Insufficient documentation

## 2023-04-12 DIAGNOSIS — I3139 Other pericardial effusion (noninflammatory): Secondary | ICD-10-CM | POA: Insufficient documentation

## 2023-04-12 DIAGNOSIS — E118 Type 2 diabetes mellitus with unspecified complications: Secondary | ICD-10-CM

## 2023-04-12 DIAGNOSIS — I10 Essential (primary) hypertension: Secondary | ICD-10-CM | POA: Diagnosis not present

## 2023-04-12 DIAGNOSIS — I4891 Unspecified atrial fibrillation: Secondary | ICD-10-CM | POA: Diagnosis not present

## 2023-04-12 DIAGNOSIS — I314 Cardiac tamponade: Secondary | ICD-10-CM | POA: Diagnosis not present

## 2023-04-12 HISTORY — PX: PERICARDIOCENTESIS: CATH118255

## 2023-04-12 LAB — BASIC METABOLIC PANEL
Anion gap: 15 (ref 5–15)
BUN: 25 mg/dL — ABNORMAL HIGH (ref 8–23)
CO2: 25 mmol/L (ref 22–32)
Calcium: 8.4 mg/dL — ABNORMAL LOW (ref 8.9–10.3)
Chloride: 93 mmol/L — ABNORMAL LOW (ref 98–111)
Creatinine, Ser: 4.5 mg/dL — ABNORMAL HIGH (ref 0.44–1.00)
GFR, Estimated: 10 mL/min — ABNORMAL LOW (ref >60)
Glucose, Bld: 142 mg/dL — ABNORMAL HIGH (ref 70–99)
Potassium: 4 mmol/L (ref 3.5–5.1)
Sodium: 133 mmol/L — ABNORMAL LOW (ref 135–145)

## 2023-04-12 LAB — CBC
HCT: 26.2 % — ABNORMAL LOW (ref 36.0–46.0)
Hemoglobin: 8.9 g/dL — ABNORMAL LOW (ref 12.0–15.0)
MCH: 34.8 pg — ABNORMAL HIGH (ref 26.0–34.0)
MCHC: 34 g/dL (ref 30.0–36.0)
MCV: 102.3 fL — ABNORMAL HIGH (ref 80.0–100.0)
Platelets: 119 10*3/uL — ABNORMAL LOW (ref 150–400)
RBC: 2.56 MIL/uL — ABNORMAL LOW (ref 3.87–5.11)
RDW: 14.6 % (ref 11.5–15.5)
WBC: 6.9 10*3/uL (ref 4.0–10.5)
nRBC: 0.3 % — ABNORMAL HIGH (ref 0.0–0.2)

## 2023-04-12 LAB — I-STAT VENOUS BLOOD GAS, ED
Acid-Base Excess: 7 mmol/L — ABNORMAL HIGH (ref 0.0–2.0)
Bicarbonate: 29.9 mmol/L — ABNORMAL HIGH (ref 20.0–28.0)
Calcium, Ion: 0.96 mmol/L — ABNORMAL LOW (ref 1.15–1.40)
HCT: 27 % — ABNORMAL LOW (ref 36.0–46.0)
Hemoglobin: 9.2 g/dL — ABNORMAL LOW (ref 12.0–15.0)
O2 Saturation: 96 %
Potassium: 4.6 mmol/L (ref 3.5–5.1)
Sodium: 132 mmol/L — ABNORMAL LOW (ref 135–145)
TCO2: 31 mmol/L (ref 22–32)
pCO2, Ven: 35.5 mm[Hg] — ABNORMAL LOW (ref 44–60)
pH, Ven: 7.533 — ABNORMAL HIGH (ref 7.25–7.43)
pO2, Ven: 69 mm[Hg] — ABNORMAL HIGH (ref 32–45)

## 2023-04-12 LAB — COMPREHENSIVE METABOLIC PANEL
ALT: 14 U/L (ref 0–44)
AST: 21 U/L (ref 15–41)
Albumin: 3.2 g/dL — ABNORMAL LOW (ref 3.5–5.0)
Alkaline Phosphatase: 86 U/L (ref 38–126)
Anion gap: 16 — ABNORMAL HIGH (ref 5–15)
BUN: 25 mg/dL — ABNORMAL HIGH (ref 8–23)
CO2: 26 mmol/L (ref 22–32)
Calcium: 8.9 mg/dL (ref 8.9–10.3)
Chloride: 92 mmol/L — ABNORMAL LOW (ref 98–111)
Creatinine, Ser: 5.11 mg/dL — ABNORMAL HIGH (ref 0.44–1.00)
GFR, Estimated: 9 mL/min — ABNORMAL LOW (ref >60)
Glucose, Bld: 117 mg/dL — ABNORMAL HIGH (ref 70–99)
Potassium: 3.7 mmol/L (ref 3.5–5.1)
Sodium: 134 mmol/L — ABNORMAL LOW (ref 135–145)
Total Bilirubin: 0.9 mg/dL (ref 0.3–1.2)
Total Protein: 7.5 g/dL (ref 6.5–8.1)

## 2023-04-12 LAB — GLUCOSE, CAPILLARY
Glucose-Capillary: 101 mg/dL — ABNORMAL HIGH (ref 70–99)
Glucose-Capillary: 93 mg/dL (ref 70–99)
Glucose-Capillary: 184 mg/dL — ABNORMAL HIGH (ref 70–99)

## 2023-04-12 LAB — BODY FLUID CELL COUNT WITH DIFFERENTIAL
Eos, Fluid: 1 %
Lymphs, Fluid: 9 %
Monocyte-Macrophage-Serous Fluid: 6 % — ABNORMAL LOW (ref 50–90)
Neutrophil Count, Fluid: 84 % — ABNORMAL HIGH (ref 0–25)
Total Nucleated Cell Count, Fluid: 1050 uL — ABNORMAL HIGH (ref 0–1000)

## 2023-04-12 LAB — MRSA NEXT GEN BY PCR, NASAL: MRSA by PCR Next Gen: NOT DETECTED

## 2023-04-12 LAB — C-REACTIVE PROTEIN: CRP: 10.2 mg/dL — ABNORMAL HIGH (ref <1.0)

## 2023-04-12 LAB — SEDIMENTATION RATE: Sed Rate: 108 mm/h — ABNORMAL HIGH (ref 0–22)

## 2023-04-12 LAB — ECHOCARDIOGRAM COMPLETE
Height: 61 in
S' Lateral: 2.9 cm
Weight: 2151.69 [oz_av]

## 2023-04-12 LAB — LACTIC ACID, PLASMA
Lactic Acid, Venous: 1.4 mmol/L (ref 0.5–1.9)
Lactic Acid, Venous: 1.6 mmol/L (ref 0.5–1.9)

## 2023-04-12 LAB — PROCALCITONIN: Procalcitonin: 0.65 ng/mL

## 2023-04-12 LAB — HEPATITIS B SURFACE ANTIGEN: Hepatitis B Surface Ag: NONREACTIVE

## 2023-04-12 LAB — HEPARIN LEVEL (UNFRACTIONATED): Heparin Unfractionated: <0.1 [IU]/mL — ABNORMAL LOW (ref 0.30–0.70)

## 2023-04-12 LAB — CBG MONITORING, ED: Glucose-Capillary: 121 mg/dL — ABNORMAL HIGH (ref 70–99)

## 2023-04-12 LAB — PHOSPHORUS: Phosphorus: 6.3 mg/dL — ABNORMAL HIGH (ref 2.5–4.6)

## 2023-04-12 SURGERY — PERICARDIOCENTESIS
Anesthesia: LOCAL

## 2023-04-12 MED ORDER — LIDOCAINE HCL (PF) 1 % IJ SOLN: 5.0000 mL | INTRAMUSCULAR | Status: DC | PRN

## 2023-04-12 MED ORDER — MIDAZOLAM HCL 2 MG/2ML IJ SOLN
INTRAMUSCULAR | Status: AC
  Filled 2023-04-12: qty 2

## 2023-04-12 MED ORDER — ALTEPLASE 2 MG IJ SOLR: 2.0000 mg | Freq: Once | INTRAMUSCULAR | Status: DC | PRN

## 2023-04-12 MED ORDER — LIDOCAINE HCL (PF) 1 % IJ SOLN
INTRAMUSCULAR | Status: AC
  Filled 2023-04-12: qty 30

## 2023-04-12 MED ORDER — FENTANYL CITRATE (PF) 100 MCG/2ML IJ SOLN
INTRAMUSCULAR | Status: DC | PRN
  Administered 2023-04-12 (×2): 25 ug via INTRAVENOUS

## 2023-04-12 MED ORDER — SEVELAMER CARBONATE 800 MG PO TABS
1600.0000 mg | ORAL_TABLET | Freq: Three times a day (TID) | ORAL | Status: DC
  Administered 2023-04-12 – 2023-04-19 (×19): 1600 mg via ORAL
  Filled 2023-04-12 (×16): qty 2

## 2023-04-12 MED ORDER — NEPRO/CARBSTEADY PO LIQD
237.0000 mL | ORAL | Status: DC | PRN
  Filled 2023-04-12: qty 237

## 2023-04-12 MED ORDER — DOXERCALCIFEROL 4 MCG/2ML IV SOLN
2.0000 ug | INTRAVENOUS | Status: DC
  Administered 2023-04-16: 2 ug via INTRAVENOUS
  Filled 2023-04-12 (×2): qty 2

## 2023-04-12 MED ORDER — LIDOCAINE HCL (PF) 1 % IJ SOLN
INTRAMUSCULAR | Status: DC | PRN
  Administered 2023-04-12: 17 mL
  Administered 2023-04-12: 10 mL
  Administered 2023-04-12: 13 mL

## 2023-04-12 MED ORDER — HEPARIN BOLUS VIA INFUSION
1000.0000 [IU] | Freq: Once | INTRAVENOUS | Status: AC
  Administered 2023-04-12: 1000 [IU] via INTRAVENOUS
  Filled 2023-04-12: qty 1000

## 2023-04-12 MED ORDER — CHLORHEXIDINE GLUCONATE CLOTH 2 % EX PADS
6.0000 | MEDICATED_PAD | Freq: Every day | CUTANEOUS | Status: DC
  Administered 2023-04-13: 6 via TOPICAL

## 2023-04-12 MED ORDER — HEPARIN (PORCINE) IN NACL 1000-0.9 UT/500ML-% IV SOLN
INTRAVENOUS | Status: DC | PRN
  Administered 2023-04-12: 1000 mL

## 2023-04-12 MED ORDER — HEPARIN SODIUM (PORCINE) 1000 UNIT/ML DIALYSIS: 1000.0000 [IU] | INTRAMUSCULAR | Status: DC | PRN

## 2023-04-12 MED ORDER — CHLORHEXIDINE GLUCONATE CLOTH 2 % EX PADS
6.0000 | MEDICATED_PAD | Freq: Every day | CUTANEOUS | Status: DC
  Administered 2023-04-13 – 2023-04-14 (×2): 6 via TOPICAL

## 2023-04-12 MED ORDER — MIDAZOLAM HCL 2 MG/2ML IJ SOLN
INTRAMUSCULAR | Status: DC | PRN
  Administered 2023-04-12: 1 mg via INTRAVENOUS

## 2023-04-12 MED ORDER — IOHEXOL 350 MG/ML SOLN
INTRAVENOUS | Status: DC | PRN
  Administered 2023-04-12: 3 mL

## 2023-04-12 MED ORDER — ANTICOAGULANT SODIUM CITRATE 4% (200MG/5ML) IV SOLN
5.0000 mL | Status: DC | PRN
  Filled 2023-04-12: qty 5

## 2023-04-12 MED ORDER — LIDOCAINE-PRILOCAINE 2.5-2.5 % EX CREA: 1.0000 | TOPICAL_CREAM | CUTANEOUS | Status: DC | PRN

## 2023-04-12 MED ORDER — MELATONIN 5 MG PO TABS
5.0000 mg | ORAL_TABLET | Freq: Every evening | ORAL | Status: DC | PRN
  Administered 2023-04-12 – 2023-04-18 (×7): 5 mg via ORAL
  Filled 2023-04-12 (×7): qty 1

## 2023-04-12 MED ORDER — FENTANYL CITRATE (PF) 100 MCG/2ML IJ SOLN
INTRAMUSCULAR | Status: AC
  Filled 2023-04-12: qty 2

## 2023-04-12 MED ORDER — MIDAZOLAM HCL 2 MG/2ML IJ SOLN
INTRAMUSCULAR | Status: DC | PRN
  Administered 2023-04-12 (×2): 1 mg via INTRAVENOUS

## 2023-04-12 MED ORDER — FENTANYL CITRATE (PF) 100 MCG/2ML IJ SOLN
INTRAMUSCULAR | Status: DC | PRN
  Administered 2023-04-12: 25 ug via INTRAVENOUS

## 2023-04-12 MED ORDER — PENTAFLUOROPROP-TETRAFLUOROETH EX AERO: 1.0000 | INHALATION_SPRAY | CUTANEOUS | Status: DC | PRN

## 2023-04-12 SURGICAL SUPPLY — 6 items
PACK CARDIAC CATHETERIZATION (CUSTOM PROCEDURE TRAY) IMPLANT
SET ATX-X65L (MISCELLANEOUS) IMPLANT
TRAY PERICARDIOCENTESIS 6FX60 (TRAY / TRAY PROCEDURE) IMPLANT
TUBING CIL FLEX 10 FLL-RA (TUBING) IMPLANT
WIRE EMERALD 3MM-J .035X150CM (WIRE) IMPLANT
WIRE MICRO SET SILHO 5FR 7 (SHEATH) IMPLANT

## 2023-04-12 NOTE — ED Notes (Signed)
ED TO INPATIENT HANDOFF REPORT  ED Nurse Name and Phone #: Einar Grad [phone]  S Name/Age/Gender Stacey Chambers 72 y.o. female Room/Bed: 046C/046C  Code Status   Code Status: Full Code  Home/SNF/Other Home Patient oriented to: self, place, time, and situation Is this baseline? Yes   Triage Complete: Triage complete  Chief Complaint Atrial fibrillation [facility] [I48.91]  Triage Note Pt bib ems from dialysis. During dialysis pts HR jumped up to 130bpm. Pt also c.o epigastric pain, cough, dry throat and poor appetite since getting her flu shot about 2 weeks ago. Pt received 2 hours of her dialysis treatment  Pt HR ranging from 130-175 BP 110/80 CBG 86 Pt on 2L [place] at baseline Pt arrives to ED a/o HR 130s   Allergies No Known Allergies  Level of Care/Admitting Diagnosis ED Disposition     ED Disposition  Admit   Condition  --   Comment  Hospital Area: [facility] [[id number]]  Level of Care: Progressive [102]  Admit to Progressive based on following criteria: CARDIOVASCULAR & THORACIC of moderate stability with acute coronary syndrome symptoms/low risk myocardial infarction/hypertensive urgency/arrhythmias/heart failure potentially compromising stability and stable post cardiovascular intervention patients.  May admit patient to [facility] or [facility] if equivalent level of care is available:: No  Covid Evaluation: Asymptomatic - no recent exposure (last 10 days) testing not required  Diagnosis: Atrial fibrillation (HCC) [427.31.ICD-9-CM]  Admitting Physician: Lurline Del [[id number]]  Attending Physician: Lurline Del [[id number]]  Certification:: I certify this patient will need inpatient services for at least 2 midnights  Expected Medical Readiness: 04/13/2023          B Medical/Surgery History Past Medical History:  Diagnosis Date   Anemia    Cirrhosis (HCC)    Diabetes mellitus without complication (HCC)    ESRD (end stage  renal disease) on dialysis (HCC)    Hypertension    Type 2 diabetes mellitus (HCC)    Vision loss    Past Surgical History:  Procedure Laterality Date   AV FISTULA PLACEMENT Left 12/23/2021   Procedure: INSERTION OF LEFT ARM BRACHIOCEPHALIC ARTERIOVENOUS FISTULA;  Surgeon: Cephus Shelling, MD;  Location: [facility] OR;  Service: Vascular;  Laterality: Left;   BACK SURGERY     COLONOSCOPY     IR FLUORO GUIDE CV LINE RIGHT  12/16/2021   IR US GUIDE VASC ACCESS RIGHT  12/16/2021   PACEMAKER LEADLESS INSERTION N/A 01/15/2023   Procedure: PACEMAKER LEADLESS INSERTION;  Surgeon: Lanier Prude, MD;  Location: [facility] INVASIVE CV LAB;  Service: Cardiovascular;  Laterality: N/A;   TEMPORARY PACEMAKER N/A 01/13/2023   Procedure: TEMPORARY PACEMAKER;  Surgeon: Orbie Pyo, MD;  Location: [facility] INVASIVE CV LAB;  Service: Cardiovascular;  Laterality: N/A;   UPPER GASTROINTESTINAL ENDOSCOPY       A IV Location/Drains/Wounds Patient Lines/Drains/Airways Status     Active Line/Drains/Airways     Name Placement date Placement time Site Days   Peripheral IV 04/11/23 20 G Right Hand 04/11/23  0934  Hand  1   Peripheral IV 04/11/23 20 G Anterior;Right Forearm 04/11/23  0950  Forearm  1   Fistula / Graft Left Upper arm Arteriovenous fistula 12/23/21  0816  Upper arm  475            Intake/Output Last 24 hours  Intake/Output Summary (Last 24 hours) at 04/12/2023 0952 Last data filed at 04/11/2023 1024 Gross per 24 hour  Intake 100 ml  Output --  Net 100 ml    Labs/Imaging Results for orders placed or performed during the hospital encounter of 04/11/23 (from the past 48 hour(s))  CBC     Status: Abnormal   Collection Time: 04/11/23  9:51 AM  Result Value Ref Range   WBC 5.4 4.0 - 10.5 K/uL   RBC 2.61 (L) 3.87 - 5.11 MIL/uL   Hemoglobin 8.8 (L) 12.0 - 15.0 g/dL   HCT 16.1 (L) 09.6 - 04.5 %   MCV 99.6 80.0 - 100.0 fL   MCH 33.7 26.0 - 34.0 pg   MCHC 33.8 30.0 - 36.0 g/dL   RDW 40.9 81.1 -  91.4 %   Platelets 126 (L) 150 - 400 K/uL   nRBC 0.0 0.0 - 0.2 %    Comment: Performed at [facility], [address], [place], [place] [address]  Troponin I (High Sensitivity)     Status: None   Collection Time: 04/11/23  9:51 AM  Result Value Ref Range   Troponin I (High Sensitivity) 9 <18 ng/L    Comment: (NOTE) Elevated high sensitivity troponin I (hsTnI) values and significant  changes across serial measurements may suggest ACS but many other  chronic and acute conditions are known to elevate hsTnI results.  Refer to the "Links" section for chest pain algorithms and additional  guidance. Performed at [facility], [address] [place], [place] [address]   Protime-INR     Status: Abnormal   Collection Time: 04/11/23  9:51 AM  Result Value Ref Range   Prothrombin Time 17.9 (H) 11.4 - 15.2 seconds   INR 1.5 (H) 0.8 - 1.2    Comment: (NOTE) INR goal varies based on device and disease states. Performed at [facility], [address], [place], [place] [address]   Comprehensive metabolic panel     Status: Abnormal   Collection Time: 04/11/23  9:51 AM  Result Value Ref Range   Sodium 134 (L) 135 - 145 mmol/L   Potassium 3.3 (L) 3.5 - 5.1 mmol/L   Chloride 90 (L) 98 - 111 mmol/L   CO2 26 22 - 32 mmol/L   Glucose, Bld 108 (H) 70 - 99 mg/dL    Comment: Glucose reference range applies only to samples taken after fasting for at least 8 hours.   BUN 15 8 - 23 mg/dL   Creatinine, Ser 4.69 (H) 0.44 - 1.00 mg/dL   Calcium 8.1 (L) 8.9 - 10.3 mg/dL   Total Protein 7.7 6.5 - 8.1 g/dL   Albumin 3.2 (L) 3.5 - 5.0 g/dL   AST 23 15 - 41 U/L   ALT 13 0 - 44 U/L   Alkaline Phosphatase 81 38 - 126 U/L   Total Bilirubin 1.0 0.3 - 1.2 mg/dL   GFR, Estimated 16 (L) >60 mL/min    Comment: (NOTE) Calculated using the CKD-EPI Creatinine Equation (2021)    Anion gap 18 (H) 5 - 15    Comment: Performed at [facility], [address], [place], [place] [address]  SARS  Coronavirus 2 by RT PCR (hospital order, performed in [facility] hospital lab) *cepheid single result test* Anterior Nasal Swab     Status: None   Collection Time: 04/11/23  9:58 AM   Specimen: Anterior Nasal Swab  Result Value Ref Range   SARS Coronavirus 2 by RT PCR NEGATIVE NEGATIVE    Comment: Performed at [facility], [address] [place], [place] [address]  Troponin I (High Sensitivity)     Status: None   Collection Time: 04/11/23 12:00 PM  Result Value Ref Range   Troponin I (High Sensitivity) 11 <18 ng/L    Comment: (NOTE) Elevated high sensitivity troponin I (hsTnI) values and significant  changes across serial measurements may suggest ACS but many other  chronic and acute conditions are known to elevate hsTnI results.  Refer to the "Links" section for chest pain algorithms and additional  guidance. Performed at [facility], [address] [place], [place] [address]   CBG monitoring, ED     Status: None   Collection Time: 04/11/23  5:10 PM  Result Value Ref Range   Glucose-Capillary 86 70 - 99 mg/dL    Comment: Glucose reference range applies only to samples taken after fasting for at least 8 hours.  C-reactive protein     Status: Abnormal   Collection Time: 04/11/23  7:57 PM  Result Value Ref Range   CRP 10.2 (H) <1.0 mg/dL    Comment: Performed at [facility], [address] [place], [place] [address]  Respiratory (~20 pathogens) panel by PCR     Status: None   Collection Time: 04/11/23  7:58 PM   Specimen: Nasopharyngeal Swab; Respiratory  Result Value Ref Range   Adenovirus NOT DETECTED NOT DETECTED   Coronavirus 229E NOT DETECTED NOT DETECTED    Comment: (NOTE) The Coronavirus on the Respiratory Panel, DOES NOT test for the novel  Coronavirus (2019 nCoV)    Coronavirus HKU1 NOT DETECTED NOT DETECTED   Coronavirus NL63 NOT DETECTED NOT DETECTED   Coronavirus OC43 NOT DETECTED NOT DETECTED   Metapneumovirus NOT DETECTED NOT DETECTED    Rhinovirus / Enterovirus NOT DETECTED NOT DETECTED   Influenza A NOT DETECTED NOT DETECTED   Influenza B NOT DETECTED NOT DETECTED   Parainfluenza Virus 1 NOT DETECTED NOT DETECTED   Parainfluenza Virus 2 NOT DETECTED NOT DETECTED   Parainfluenza Virus 3 NOT DETECTED NOT DETECTED   Parainfluenza Virus 4 NOT DETECTED NOT DETECTED   Respiratory Syncytial Virus NOT DETECTED NOT DETECTED   Bordetella pertussis NOT DETECTED NOT DETECTED   Bordetella Parapertussis NOT DETECTED NOT DETECTED   Chlamydophila pneumoniae NOT DETECTED NOT DETECTED   Mycoplasma pneumoniae NOT DETECTED NOT DETECTED    Comment: Performed at [facility], [address] [place], [place] [address]  Lactic acid, plasma     Status: Abnormal   Collection Time: 04/11/23  8:10 PM  Result Value Ref Range   Lactic Acid, Venous 3.4 (HH) 0.5 - 1.9 mmol/L    Comment: RESULT CALLED TO, READ BACK BY AND VERIFIED WITH Fredia Sorrow RN @ 2100 04/11/23 JBUTLER Performed at [facility], [address] [place], [place] [address]   Lactic acid, plasma     Status: Abnormal   Collection Time: 04/11/23 10:58 PM  Result Value Ref Range   Lactic Acid, Venous 3.4 (HH) 0.5 - 1.9 mmol/L    Comment: CRITICAL VALUE NOTED. VALUE IS CONSISTENT WITH PREVIOUSLY REPORTED/CALLED VALUE Performed at [facility], [address] [place], [place] [address]   Sedimentation rate     Status: Abnormal   Collection Time: 04/11/23 11:00 PM  Result Value Ref Range   Sed Rate 108 (H) 0 - 22 mm/hr    Comment: Performed at [facility], [address] [place], [place] [address]  Basic metabolic panel     Status: Abnormal  Collection Time: 04/11/23 11:00 PM  Result Value Ref Range   Sodium 133 (L) 135 - 145 mmol/L   Potassium 4.0 3.5 - 5.1 mmol/L    Comment: HEMOLYSIS AT THIS LEVEL MAY AFFECT RESULT   Chloride 93 (L) 98 - 111 mmol/L   CO2 25 22 - 32 mmol/L   Glucose, Bld 142 (H) 70 - 99 mg/dL    Comment: Glucose reference range  applies only to samples taken after fasting for at least 8 hours.   BUN 25 (H) 8 - 23 mg/dL   Creatinine, Ser 1.61 (H) 0.44 - 1.00 mg/dL   Calcium 8.4 (L) 8.9 - 10.3 mg/dL   GFR, Estimated 10 (L) >60 mL/min    Comment: (NOTE) Calculated using the CKD-EPI Creatinine Equation (2021)    Anion gap 15 5 - 15    Comment: Performed at [facility], [address] [place], [place] [address]  Procalcitonin     Status: None   Collection Time: 04/11/23 11:00 PM  Result Value Ref Range   Procalcitonin 0.65 ng/mL    Comment:        Interpretation: PCT > 0.5 ng/mL and <= 2 ng/mL: Systemic infection (sepsis) is possible, but other conditions are known to elevate PCT as well. (NOTE)       Sepsis PCT Algorithm           Lower Respiratory Tract                                      Infection PCT Algorithm    ----------------------------     ----------------------------         PCT < 0.25 ng/mL                PCT < 0.10 ng/mL          Strongly encourage             Strongly discourage   discontinuation of antibiotics    initiation of antibiotics    ----------------------------     -----------------------------       PCT 0.25 - 0.50 ng/mL            PCT 0.10 - 0.25 ng/mL               OR       >80% decrease in PCT            Discourage initiation of                                            antibiotics      Encourage discontinuation           of antibiotics    ----------------------------     -----------------------------         PCT >= 0.50 ng/mL              PCT 0.26 - 0.50 ng/mL                AND       <80% decrease in PCT             Encourage initiation of  antibiotics       Encourage continuation           of antibiotics    ----------------------------     -----------------------------        PCT >= 0.50 ng/mL                  PCT > 0.50 ng/mL               AND         increase in PCT                  Strongly encourage                                       initiation of antibiotics    Strongly encourage escalation           of antibiotics                                     -----------------------------                                           PCT <= 0.25 ng/mL                                                 OR                                        > 80% decrease in PCT                                      Discontinue / Do not initiate                                             antibiotics  Performed at [facility], [address] [place], [place] [address]   Heparin level (unfractionated)     Status: Abnormal   Collection Time: 04/12/23 12:08 AM  Result Value Ref Range   Heparin Unfractionated <0.10 (L) 0.30 - 0.70 IU/mL    Comment: (NOTE) The clinical reportable range upper limit is being lowered to >1.10 to align with the FDA approved guidance for the current laboratory assay.  If heparin results are below expected values, and patient dosage has  been confirmed, suggest follow up testing of antithrombin III levels. Performed at [facility], [address], [place], [place] [address]   I-Stat venous blood gas, [facility] ED, [facility], [facility])     Status: Abnormal   Collection Time: 04/12/23 12:13 AM  Result Value Ref Range   pH, Ven 7.533 (H) 7.25 - 7.43   pCO2, Ven 35.5 (L) 44 - 60 mmHg   pO2, Ven 69 (H) 32 - 45 mmHg   Bicarbonate  29.9 (H) 20.0 - 28.0 mmol/L   TCO2 31 22 - 32 mmol/L   O2 Saturation 96 %   Acid-Base Excess 7.0 (H) 0.0 - 2.0 mmol/L   Sodium 132 (L) 135 - 145 mmol/L   Potassium 4.6 3.5 - 5.1 mmol/L   Calcium, Ion 0.96 (L) 1.15 - 1.40 mmol/L   HCT 27.0 (L) 36.0 - 46.0 %   Hemoglobin 9.2 (L) 12.0 - 15.0 g/dL   Sample type VENOUS   CBC     Status: Abnormal   Collection Time: 04/12/23  4:16 AM  Result Value Ref Range   WBC 6.9 4.0 - 10.5 K/uL   RBC 2.56 (L) 3.87 - 5.11 MIL/uL   Hemoglobin 8.9 (L) 12.0 - 15.0 g/dL   HCT 29.5 (L) 28.4 - 13.2 %   MCV 102.3 (H) 80.0 - 100.0 fL   MCH  34.8 (H) 26.0 - 34.0 pg   MCHC 34.0 30.0 - 36.0 g/dL   RDW 44.0 10.2 - 72.5 %   Platelets 119 (L) 150 - 400 K/uL   nRBC 0.3 (H) 0.0 - 0.2 %    Comment: Performed at [facility], [address], [place], [place] [address]  Lactic acid, plasma     Status: None   Collection Time: 04/12/23  4:16 AM  Result Value Ref Range   Lactic Acid, Venous 1.4 0.5 - 1.9 mmol/L    Comment: Performed at [facility], [address], [place], [place] [address]  Comprehensive metabolic panel     Status: Abnormal   Collection Time: 04/12/23  5:10 AM  Result Value Ref Range   Sodium 134 (L) 135 - 145 mmol/L   Potassium 3.7 3.5 - 5.1 mmol/L   Chloride 92 (L) 98 - 111 mmol/L   CO2 26 22 - 32 mmol/L   Glucose, Bld 117 (H) 70 - 99 mg/dL    Comment: Glucose reference range applies only to samples taken after fasting for at least 8 hours.   BUN 25 (H) 8 - 23 mg/dL   Creatinine, Ser 2.59 (H) 0.44 - 1.00 mg/dL   Calcium 8.9 8.9 - 56.3 mg/dL   Total Protein 7.5 6.5 - 8.1 g/dL   Albumin 3.2 (L) 3.5 - 5.0 g/dL   AST 21 15 - 41 U/L   ALT 14 0 - 44 U/L   Alkaline Phosphatase 86 38 - 126 U/L   Total Bilirubin 0.9 0.3 - 1.2 mg/dL   GFR, Estimated 9 (L) >60 mL/min    Comment: (NOTE) Calculated using the CKD-EPI Creatinine Equation (2021)    Anion gap 16 (H) 5 - 15    Comment: Performed at [facility], [address] [place], [place] [address]  CBG monitoring, ED     Status: Abnormal   Collection Time: 04/12/23  8:14 AM  Result Value Ref Range   Glucose-Capillary 121 (H) 70 - 99 mg/dL    Comment: Glucose reference range applies only to samples taken after fasting for at least 8 hours.  Lactic acid, plasma     Status: None   Collection Time: 04/12/23  8:16 AM  Result Value Ref Range   Lactic Acid, Venous 1.6 0.5 - 1.9 mmol/L    Comment: Performed at [facility], [address], [place], [place] [address]   CT CHEST WO CONTRAST  Result Date: 04/12/2023 CLINICAL DATA:  Pneumonia,  tachycardia, chest pain EXAM: CT CHEST WITHOUT CONTRAST TECHNIQUE: Multidetector CT imaging of the chest  was performed following the standard protocol without IV contrast. RADIATION DOSE REDUCTION: This exam was performed according to the departmental dose-optimization program which includes automated exposure control, adjustment of the mA and/or kV according to patient size and/or use of iterative reconstruction technique. COMPARISON:  12/01/2001 FINDINGS: Cardiovascular: Large pericardial effusion with relatively high attenuation fluid which may represent a hemopericardium or proteinaceous fluid. Lead less pacemaker noted within the right ventricular apex. Cardiac size within normal limits. No significant coronary artery calcification. Central pulmonary arteries are of normal caliber. Moderate atherosclerotic calcification within the thoracic aorta. No aortic aneurysm. Mediastinum/Nodes: No enlarged mediastinal or axillary lymph nodes. Thyroid gland, trachea, and esophagus demonstrate no significant findings. Lungs/Pleura: Moderate right and small left pleural effusions are present with associated bibasilar dependent atelectasis. Asymmetric interstitial pulmonary infiltrate within the perihilar region more severe within the left lung is favored to represent asymmetric perihilar interstitial pulmonary edema. No pneumothorax. No central obstructing lesion. Upper Abdomen: Cirrhosis. Mild perihepatic and perisplenic ascites. No acute abnormality. Musculoskeletal: No acute bone abnormality. No lytic or blastic bone lesion. IMPRESSION: 1. Large pericardial effusion with relatively high attenuation fluid which may represent a hemopericardium or proteinaceous fluid. This is not well assessed on this examination, however, given the evidence of cardiogenic failure, echocardiography may be helpful to assess for cardiac tamponade. 2. Mild cardiogenic failure with asymmetric interstitial pulmonary edema and bilateral pleural  effusions, right greater than left. 3. Cirrhosis. Mild perihepatic and perisplenic ascites. Electronically Signed   By: Helyn Numbers M.D.   On: 04/12/2023 00:36   DG Chest Port 1 View  Result Date: 04/11/2023 CLINICAL DATA:  Chest pain EXAM: PORTABLE CHEST 1 VIEW COMPARISON:  01/16/2023 FINDINGS: Enlarged cardiopericardial silhouette. Loop recorder. Small pleural effusions. Vascular congestion with some peribronchial thickening. No pneumothorax. Overlapping cardiac leads. IMPRESSION: Enlarged heart with some vascular congestion and peribronchial thickening. Small pleural effusions. Electronically Signed   By: Karen Kays M.D.   On: 04/11/2023 10:53    Pending Labs Unresulted Labs (From admission, onward)     Start     Ordered   04/12/23 0500  CBC  Daily,   R     Placed in "And" Linked Group   04/12/23 0047   04/11/23 2234  High sensitivity CRP  Once,   R        04/11/23 2233            Vitals/Pain Today's Vitals   04/12/23 0300 04/12/23 0556 04/12/23 0634 04/12/23 0917  BP: 100/84  125/69 124/77  Pulse: 63  73 74  Resp: (!) 31  18 20   Temp:  98.3 F (36.8 C)  98.4 F (36.9 C)  TempSrc:  Oral  Oral  SpO2: 92%  90% 95%  Weight:      Height:      PainSc:    9     Isolation Precautions Droplet precaution  Medications Medications  metoprolol tartrate (LOPRESSOR) tablet 25 mg (25 mg Oral Given 04/12/23 0918)  insulin aspart (novoLOG) injection 0-6 Units ( Subcutaneous Not Given 04/12/23 0817)  acetaminophen (TYLENOL) tablet 650 mg (650 mg Oral Given 04/12/23 0919)    Or  acetaminophen (TYLENOL) suppository 650 mg ( Rectal See Alternative 04/12/23 0919)  ondansetron (ZOFRAN) tablet 4 mg (has no administration in time range)    Or  ondansetron (ZOFRAN) injection 4 mg (has no administration in time range)  albuterol (PROVENTIL) (2.5 MG/3ML) 0.083% nebulizer solution 2.5 mg (has no administration in time range)  diltiazem (CARDIZEM) injection 10  mg (10 mg Intravenous  Given 04/11/23 1003)  lactated ringers bolus 1,000 mL (0 mLs Intravenous Stopped 04/11/23 1024)  acetaminophen (TYLENOL) tablet 1,000 mg (1,000 mg Oral Given 04/11/23 1200)  heparin bolus via infusion 3,000 Units (3,000 Units Intravenous Bolus from Bag 04/11/23 1545)  sodium chloride 0.9 % bolus 250 mL (250 mLs Intravenous Bolus 04/11/23 2238)  heparin bolus via infusion 1,000 Units (1,000 Units Intravenous Bolus from Bag 04/12/23 0048)    Mobility walks     Focused Assessments Cardiac Assessment Handoff:  Cardiac Rhythm: Normal sinus rhythm No results found for: "CKTOTAL", "CKMB", "CKMBINDEX", "TROPONINI" Lab Results  Component Value Date   DDIMER 0.87 (H) 09/17/2022   Does the Patient currently have chest pain? No    R Recommendations: See Admitting Provider Note  Report given to:   Additional Notes:

## 2023-04-12 NOTE — Progress Notes (Addendum)
[facility] PROGRESS NOTE    Progress Note  Stacey Chambers  WUJ:[id number] DOB: Oct 05, 1950 DOA: 04/11/2023 PCP: Corliss Blacker, MD     Brief Narrative:   Stacey Chambers is an 72 y.o. 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  Accompanied with shortness of breath and chest pain, sore throat poor appetite and abdominal pain.   Assessment/Plan:   New onset atrial fibrillation in a patient with a history of complete heart block with leadless pacemaker: Was started on IV Cardizem and now converted back to sinus rhythm. Started on IV heparin and metoprolol by cardiology.  Diltiazem discontinued. 2D echo is pending. Cardiology is on board was concerned that her A-fib might be related to her progression of her pericardial effusion.  Pericardial effusion: Small in March cardiology is concerned this has progressed as chest x-ray showed concerning increases in size. No signs of tamponade. They recommended IV heparin and follow-up.  Chronic diastolic heart failure: Last EF of 60% in March 2024 no wall motion abnormality.  Has evidence of vascular congestion with JVD on physical exam crackles at bases, she reports orthopnea. Probably some of her epigastric discomfort is due to vascular congestion. GDMT limited by ARDS. Nephrology has been consulted for dialysis  Essential hypertension: Resume Cozaar, hydralazine and amlodipine.  Blood pressure is improved this morning.  Diabetes mellitus type 2 with complications (HCC): Started on sliding scale insulin blood glucose fairly controlled.  End stage renal disease on dialysis [facility]) Renal has been notified.  Usually dialyzes Tuesday Thursdays and  Saturdays.  Hypokalemia: Per-renal.  Flulike symptoms: She status post vaccine, respiratory panel is negative CT chest showed large pleural effusion with high attenuation.  With asymmetric interstitial pulmonary edema and bilateral effusions right greater than left, has mild cirrhosis.  Cirrhosis: Noted.  LFTs unremarkable, albumin 3.2.    DVT prophylaxis: lovenox Family Communication:none Status is: Inpatient 80Remains inpatient appropriate because: A-fib with RVR and pericardial effusion    Code Status:     Code Status Orders  (From admission, onward)           Start     Ordered   04/11/23 1628  Full code  Continuous       Question:  By:  Answer:  Default: patient does not have capacity for decision making, no surrogate or prior directive available   04/11/23 1628           Code Status History     Date Active Date Inactive Code Status Order ID Comments User Context   01/13/2023 1910 01/17/2023 2356 Full Code [id number]  Simonne Martinet, NP Inpatient   10/20/2022 0847 10/27/2022 2041 Full Code [id number]  Gleason, Markus Jarvis ED   09/16/2022 0422 09/22/2022 0105 Full Code [id number]  Lurline Del, MD ED   12/01/2021 1825 12/28/2021 0014 Full Code [id number]  Merlene Laughter, DO Inpatient         IV Access:   Peripheral IV   Procedures and diagnostic studies:   CT CHEST WO CONTRAST  Result Date: 04/12/2023 CLINICAL DATA:  Pneumonia, tachycardia, chest pain EXAM: CT CHEST WITHOUT CONTRAST TECHNIQUE: Multidetector CT imaging of the chest was performed following the standard protocol without IV contrast. RADIATION DOSE REDUCTION: This exam was performed according to the departmental dose-optimization program which  includes automated exposure control, adjustment of the mA and/or kV according to patient size and/or use of iterative reconstruction technique. COMPARISON:  12/01/2001 FINDINGS: Cardiovascular: Large pericardial effusion with relatively high  attenuation fluid which may represent a hemopericardium or proteinaceous fluid. Lead less pacemaker noted within the right ventricular apex. Cardiac size within normal limits. No significant coronary artery calcification. Central pulmonary arteries are of normal caliber. Moderate atherosclerotic calcification within the thoracic aorta. No aortic aneurysm. Mediastinum/Nodes: No enlarged mediastinal or axillary lymph nodes. Thyroid gland, trachea, and esophagus demonstrate no significant findings. Lungs/Pleura: Moderate right and small left pleural effusions are present with associated bibasilar dependent atelectasis. Asymmetric interstitial pulmonary infiltrate within the perihilar region more severe within the left lung is favored to represent asymmetric perihilar interstitial pulmonary edema. No pneumothorax. No central obstructing lesion. Upper Abdomen: Cirrhosis. Mild perihepatic and perisplenic ascites. No acute abnormality. Musculoskeletal: No acute bone abnormality. No lytic or blastic bone lesion. IMPRESSION: 1. Large pericardial effusion with relatively high attenuation fluid which may represent a hemopericardium or proteinaceous fluid. This is not well assessed on this examination, however, given the evidence of cardiogenic failure, echocardiography may be helpful to assess for cardiac tamponade. 2. Mild cardiogenic failure with asymmetric interstitial pulmonary edema and bilateral pleural effusions, right greater than left. 3. Cirrhosis. Mild perihepatic and perisplenic ascites. Electronically Signed   By: Helyn Numbers M.D.   On: 04/12/2023 00:36   DG Chest Port 1 View  Result Date: 04/11/2023 CLINICAL DATA:  Chest pain EXAM: PORTABLE CHEST 1 VIEW COMPARISON:  01/16/2023 FINDINGS: Enlarged cardiopericardial silhouette. Loop recorder. Small pleural effusions. Vascular congestion with some peribronchial thickening. No pneumothorax. Overlapping cardiac leads. IMPRESSION: Enlarged heart with some  vascular congestion and peribronchial thickening. Small pleural effusions. Electronically Signed   By: Karen Kays M.D.   On: 04/11/2023 10:53     Medical Consultants:   None.   Subjective:    Stacey Chambers she has shortness of breath some epigastric pressure still orthopneic  Objective:    Vitals:   04/12/23 0215 04/12/23 0300 04/12/23 0556 04/12/23 0634  BP:  100/84  125/69  Pulse:  63  73  Resp:  (!) 31  18  Temp: 98.2 F (36.8 C)  98.3 F (36.8 C)   TempSrc: Oral  Oral   SpO2:  92%  90%  Weight:      Height:       SpO2: 90 % O2 Flow Rate (L/min): 2 L/min   Intake/Output Summary (Last 24 hours) at 04/12/2023 0718 Last data filed at 04/11/2023 1024 Gross per 24 hour  Intake 100 ml  Output --  Net 100 ml   Filed Weights   04/11/23 1500  Weight: 61 kg    Exam: General exam: In no acute distress. Respiratory system: Good air movement and clear to auscultation. Cardiovascular system: S1 & S2 heard, RRR. + JVD. Gastrointestinal system: Abdomen is nondistended, soft and nontender.  Extremities: No pedal edema. Skin: No rashes, lesions or ulcers Psychiatry: Judgement and insight appear normal. Mood & affect appropriate.    Data Reviewed:    Labs: Basic Metabolic Panel: Recent Labs  Lab 04/11/23 0951 04/11/23 2300 04/12/23 0013 04/12/23 0510  NA 134* 133* 132* 134*  K 3.3* 4.0 4.6 3.7  CL 90* 93*  --  92*  CO2 26 25  --  26  GLUCOSE 108* 142*  --  117*  BUN 15 25*  --  25*  CREATININE 3.06* 4.50*  --  5.11*  CALCIUM 8.1* 8.4*  --  8.9   GFR Estimated Creatinine Clearance: 8.7 mL/min (A) (by C-G formula based on SCr of 5.11 mg/dL (H)). Liver Function Tests: Recent Labs  Lab 04/11/23 0951 04/12/23 0510  AST 23 21  ALT 13 14  ALKPHOS 81 86  BILITOT 1.0 0.9  PROT 7.7 7.5  ALBUMIN 3.2* 3.2*   No results for input(s): "LIPASE", "AMYLASE" in the last 168 hours. No results for input(s): "AMMONIA" in the last 168 hours. Coagulation  profile Recent Labs  Lab 04/11/23 0951  INR 1.5*   COVID-19 Labs  Recent Labs    04/11/23 1957  CRP 10.2*    Lab Results  Component Value Date   SARSCOV2NAA NEGATIVE 04/11/2023   SARSCOV2NAA POSITIVE (A) 09/21/2022   SARSCOV2NAA POSITIVE (A) 09/15/2022   SARSCOV2NAA POSITIVE (A) 12/01/2021    CBC: Recent Labs  Lab 04/11/23 0951 04/12/23 0013 04/12/23 0416  WBC 5.4  --  6.9  HGB 8.8* 9.2* 8.9*  HCT 26.0* 27.0* 26.2*  MCV 99.6  --  102.3*  PLT 126*  --  119*   Cardiac Enzymes: No results for input(s): "CKTOTAL", "CKMB", "CKMBINDEX", "TROPONINI" in the last 168 hours. BNP (last 3 results) No results for input(s): "PROBNP" in the last 8760 hours. CBG: Recent Labs  Lab 04/11/23 1710  GLUCAP 86   D-Dimer: No results for input(s): "DDIMER" in the last 72 hours. Hgb A1c: No results for input(s): "HGBA1C" in the last 72 hours. Lipid Profile: No results for input(s): "CHOL", "HDL", "LDLCALC", "TRIG", "CHOLHDL", "LDLDIRECT" in the last 72 hours. Thyroid function studies: No results for input(s): "TSH", "T4TOTAL", "T3FREE", "THYROIDAB" in the last 72 hours.  Invalid input(s): "FREET3" Anemia work up: No results for input(s): "VITAMINB12", "FOLATE", "FERRITIN", "TIBC", "IRON", "RETICCTPCT" in the last 72 hours. Sepsis Labs: Recent Labs  Lab 04/11/23 0951 04/11/23 2010 04/11/23 2258 04/11/23 2300 04/12/23 0416  PROCALCITON  --   --   --  0.65  --   WBC 5.4  --   --   --  6.9  LATICACIDVEN  --  3.4* 3.4*  --  1.4   Microbiology Recent Results (from the past 240 hour(s))  SARS Coronavirus 2 by RT PCR (hospital order, performed in [facility] hospital lab) *cepheid single result test* Anterior Nasal Swab     Status: None   Collection Time: 04/11/23  9:58 AM   Specimen: Anterior Nasal Swab  Result Value Ref Range Status   SARS Coronavirus 2 by RT PCR NEGATIVE NEGATIVE Final    Comment: Performed at [facility] Lab, [address], [place], [place] [address]   Respiratory (~20 pathogens) panel by PCR     Status: None   Collection Time: 04/11/23  7:58 PM   Specimen: Nasopharyngeal Swab; Respiratory  Result Value Ref Range Status   Adenovirus NOT DETECTED NOT DETECTED Final   Coronavirus 229E NOT DETECTED NOT DETECTED Final    Comment: (NOTE) The Coronavirus on the Respiratory Panel, DOES NOT test for the novel  Coronavirus (2019 nCoV)    Coronavirus HKU1 NOT DETECTED NOT DETECTED Final   Coronavirus NL63 NOT DETECTED NOT DETECTED Final   Coronavirus OC43 NOT DETECTED NOT DETECTED Final   Metapneumovirus NOT DETECTED NOT DETECTED Final   Rhinovirus / Enterovirus NOT DETECTED NOT DETECTED Final   Influenza A NOT DETECTED NOT DETECTED Final   Influenza B NOT DETECTED NOT DETECTED Final   Parainfluenza Virus 1 NOT DETECTED NOT DETECTED Final   Parainfluenza Virus  2 NOT DETECTED NOT DETECTED Final   Parainfluenza Virus 3 NOT DETECTED NOT DETECTED Final   Parainfluenza Virus 4 NOT DETECTED NOT DETECTED Final   Respiratory Syncytial Virus NOT DETECTED NOT DETECTED Final   Bordetella pertussis NOT DETECTED NOT DETECTED Final   Bordetella Parapertussis NOT DETECTED NOT DETECTED Final   Chlamydophila pneumoniae NOT DETECTED NOT DETECTED Final   Mycoplasma pneumoniae NOT DETECTED NOT DETECTED Final    Comment: Performed at [facility], [address], [place], [place] [address]     Medications:    insulin aspart  0-6 Units Subcutaneous TID WC   metoprolol tartrate  25 mg Oral BID   Continuous Infusions:    LOS: 1 day   Marinda Elk  [facility]  04/12/2023, 7:18 AM

## 2023-04-12 NOTE — Progress Notes (Signed)
PHARMACY - ANTICOAGULATION CONSULT NOTE  Pharmacy Consult for heparin  Indication: atrial fibrillation  No Known Allergies  Patient Measurements: Height: 5\' 1"  (154.9 cm) Weight: 61 kg (134 lb 7.7 oz) IBW/kg (Calculated) : 47.8 Heparin Dosing Weight: 60.1kg   Vital Signs: Temp: 98.3 F (36.8 C) (10/09 2233) Temp Source: Oral (10/09 2233) BP: 111/55 (10/09 2230) Pulse Rate: 66 (10/09 2230)  Labs: Recent Labs    04/11/23 0951 04/11/23 1200 04/11/23 2300 04/12/23 0008 04/12/23 0013  HGB 8.8*  --   --   --  9.2*  HCT 26.0*  --   --   --  27.0*  PLT 126*  --   --   --   --   LABPROT 17.9*  --   --   --   --   INR 1.5*  --   --   --   --   HEPARINUNFRC  --   --   --  <0.10*  --   CREATININE 3.06*  --  4.50*  --   --   TROPONINIHS 9 11  --   --   --     Estimated Creatinine Clearance: 9.9 mL/min (A) (by C-G formula based on SCr of 4.5 mg/dL (H)).   Medical History: Past Medical History:  Diagnosis Date   Anemia    Cirrhosis (HCC)    Diabetes mellitus without complication (HCC)    ESRD (end stage renal disease) on dialysis (HCC)    Hypertension    Type 2 diabetes mellitus (HCC)    Vision loss     Assessment: Patient admitted with CC of afib with RVR. Hx of complete heart block s/p PPM placement. Not on anticoagulation PTA. HgB 8.8 and PLTs 126. Pharmacy consulted to dose heparin gtt.   10/10 AM: heparin level returned at <0.1 on 850 units/hr. Per RN, no issues with the heparin infusion running continuously or pauses. Heparin level drawn appropriately. Last CBC shows Hgb low, stable, plts at 126.  Goal of Therapy:  Heparin level 0.3-0.7 units/ml Monitor platelets by anticoagulation protocol: Yes   Plan:  Give 1000 units bolus x 1 Start heparin infusion at 1000 units/hr Check anti-Xa level in 8 hours and daily while on heparin Continue to monitor H&H and platelets  Arabella Merles, PharmD. Clinical Pharmacist 04/12/2023 12:44 AM

## 2023-04-12 NOTE — Progress Notes (Signed)
Called to get report for dialysis nephrologist wants patient to run today and tomorrow  spoke with Sinclair Ship, RN said pt wont be able to come now due to cardiologist in the room planning possible procedure. Dr Arlean Hopping notified

## 2023-04-12 NOTE — Progress Notes (Signed)
Patient off floor to cath lab.  

## 2023-04-12 NOTE — ED Notes (Signed)
Pt placed on hospital bed

## 2023-04-12 NOTE — Progress Notes (Addendum)
Patient Name: Stacey Chambers Date of Encounter: 04/12/2023 [facility] Cardiologist: Maisie Fus, MD   Interval Summary  .    Sitting up in bed, seen with MD. No chest pain, but continues to feel short of breath  Vital Signs .    Vitals:   04/12/23 0300 04/12/23 0556 04/12/23 0634 04/12/23 0917  BP: 100/84  125/69 124/77  Pulse: 63  73 74  Resp: (!) 31  18 20   Temp:  98.3 F (36.8 C)  98.4 F (36.9 C)  TempSrc:  Oral  Oral  SpO2: 92%  90% 95%  Weight:      Height:        Intake/Output Summary (Last 24 hours) at 04/12/2023 0937 Last data filed at 04/11/2023 1024 Gross per 24 hour  Intake 100 ml  Output --  Net 100 ml      04/11/2023    3:00 PM 03/20/2023    9:22 AM 01/17/2023    4:02 AM  Last 3 Weights  Weight (lbs) 134 lb 7.7 oz 142 lb 12.8 oz 135 lb 2.3 oz  Weight (kg) 61 kg 64.774 kg 61.3 kg      Telemetry/ECG    Sinus rhythm  - Personally Reviewed  Relevant CV Studies TTE 04/12/2023:  1. Left ventricular ejection fraction, by estimation, is 60 to 65%. The left ventricle has normal function. The left ventricle has no regional wall motion abnormalities. Left ventricular diastolic parameters are  consistent with Grade I diastolic dysfunction (impaired relaxation).   2. Right ventricular systolic function is normal. The right ventricular size is normal. There is normal pulmonary artery systolic pressure.   3. Pericardial effusion measures up to 3.2 cm. Comapred with the echo 09/2022, pericardial effusion is much larger. Echo findings of diastolic RA and RV collapse as well as >25% respiratory inflow varation are suggestive  of cardiac tamponade. The IVC is not dilated, but doesn't collapse, indicating RA pressure 8 mmHg. Suggest clinical correlation as patient has several features suggesting that she is at risk for tamponade. Large pericardial effusion. The pericardial  effusion is circumferential.   4. The mitral valve is normal in structure.  Mild mitral valve regurgitation. No evidence of mitral stenosis.   5. The aortic valve was not well visualized. There is mild calcification of the aortic valve. There is mild thickening of the aortic valve. Aortic valve regurgitation is not visualized. No aortic stenosis is present.   6. The inferior vena cava is normal in size with <50% respiratory variability, suggesting right atrial pressure of 8 mmHg.    Physical Exam .   GEN: No acute distress.   Neck: + JVD Cardiac: RRR, no murmurs, rubs, or gallops.  Respiratory: Diminished in bases GI: Soft, nontender, non-distended  MS: No edema  Assessment & Plan .     72 y.o. female with a hx of ESRD, alcohol cirrhosis, hypertension, DM, anemia, complete heart block s/p leadless PPM who is being seen 04/11/2023 for the evaluation of paroxysmal afib at the request of Dr. Posey Rea and Evlyn Kanner PA-C.   Atrial fibrillation Hx CHB s/p leadless PPM -- history of CHB leading to placement of micra device earlier this year developed first documented occurrence of afib during dialysis session 10/9.  -- converted with IV cardizem. Remains in NSR -- continue Metoprolol Tartrate 25mg  BID  -- will need anticoagulation, will wait on DOAC until after she has echocardiogram to assess effusion. IV heparin stopped overnight with concerns of hemopericardium  Pericardial effusion -- Patient with previously noted small circumferential pericardial effusion on March TTE. -- Chest CT last evening reported large pericardial effusion with high attenuation fluid concerning for hemopericardium or proteinaceous fluid -- Remains hemodynamically stable with no signs of tamponade  -- Echo pending (have asked that her echo be prioritized) Echo personally reviewed shows severe 3.2 cm circumferential pericardial effusion that is significantly larger than last visit.  There are some borderline echocardiac graphic features that would suggest potential tamponade physiology.   However clinically she is not in tamponade she just has significant dyspnea.  I wonder if some of the reason for lack of RV collapse is because of significant volume overload from under diuresis.  As such I do think that is pertinent for [place] to proceed with pericardiocentesis prior to having her go for dialysis.  I suspect that her effusion is most likely uremic in nature, however with her recent Micra pacemaker placement it is possible that there could have been a perforation that caused this.  This was part of my concern for initiation of heparin versus a full DOAC for her A-fib that she presented with.  Heparin has ultimately been discontinued and would hold off on DOAC for the A-fib until we are stable without significant pericardial effusion.  Long discussion with the patient about the diagnosis and then the pericardial centesis procedure using [name] interpreter (telephone interpreter named [place], [phone])  Informed Consent   Shared Decision Making/Informed Consent The risks [bleeding, infection, cardiac arrest, damage to the heart or blood vessels requiring surgery, pneumothorax requiring chest tube placement, arrhythmias, changes in blood pressure, organ injury, and rarely death], benefits [therapeutic relief of fluid collection around the heart, diagnostic support], and alternatives of a pericardiocentesis were discussed in detail with Stacey Chambers and she agrees to proceed.   .  She probably will be transferred to the CVICU postprocedure with pericardial drain in place and we will reassess.  She should be fine for dialysis after the pericardiocentesis but would probably best to be done in the CVICU.   HFpEF -- Last echocardiogram from 03/24 with LVEF 60-65%, no RWMA.  No JVD on exam with crackles.  => I suspect this is probably related to the pericardial effusion.  She also has significant pleural effusions. -- Pending nephrology consult for HD -- GDMT limited by ESRD   Per primary  team: DM ESRD Anemia of chronic disease Hypokalemia  For questions or updates, please contact [facility] Please consult [email or web address] for contact info under   Signed, Laverda Page, NP    ATTENDING ATTESTATION  I have seen, examined and evaluated the patient this morning along with Laverda Page, NP.Marland Kitchen  After reviewing all the available data and chart, we discussed the patients laboratory, study & physical findings as well as symptoms in detail.  I agree with her findings, examination as well as impression recommendations as per our discussion.  I then return to see the patient after the echocardiogram results were reviewed to and discussed the results and discussed pericardiocentesis with informed consent and noted above.  Attending adjustments noted in italics.   Will await pericardiocentesis.  Total time with patient 40 minutes including time with interpreter and discussion of results.  Additional time discussing pericardiocentesis procedure with colleagues and hospitalist-10 minutes.  20 minutes in charting.  Overall time with patient 70 minutes.   Marykay Lex, MD, MS Bryan Lemma, M.D., M.S. Interventional Cardiologist  [facility]  Pager # [phone] Phone # [phone] [place], [place] [address]

## 2023-04-12 NOTE — Interval H&P Note (Signed)
History and Physical Interval Note:  04/12/2023 1:04 PM  Stacey Chambers  has presented today for surgery, with the diagnosis of pericardial effusion.  The various methods of treatment have been discussed with the patient and family. After consideration of risks, benefits and other options for treatment, the patient has consented to  Procedure(s): PERICARDIOCENTESIS (N/A) as a surgical intervention.  The patient's history has been reviewed, patient examined, no change in status, stable for surgery.  I have reviewed the patient's chart and labs.  Questions were answered to the patient's satisfaction.     Orbie Pyo

## 2023-04-12 NOTE — Progress Notes (Signed)
Patient arrived to [id number]. VS stable, otiented to room. Patient has phone, purse, glasses, clothes and shoes at bedside.

## 2023-04-12 NOTE — Progress Notes (Addendum)
Pericardial effusion:  CT chest without contrast IMPRESSION: 1. Large pericardial effusion with relatively high attenuation fluid which may represent a hemopericardium or proteinaceous fluid. This is not well assessed on this examination, however, given the evidence of cardiogenic failure, echocardiography may be helpful to assess for cardiac tamponade. 2. Mild cardiogenic failure with asymmetric interstitial pulmonary edema and bilateral pleural effusions, right greater than left. 3. Cirrhosis. Mild perihepatic and perisplenic ascites.  -Spoke with on-call radiology Dr. Ramiro Harvest who recommended to obtain an echocardiogram to to assess for cardiac tamponade. -Admitting provider already has been worried echocardiogram will follow-up with that.  Cardiology is on board and following the patient.  +++ Spoke with on-call cardiology Dr. Maximino Sarin, based on the CT findings which showing possible hemopericardium, cardiology is recommending to hold the heparin drip for now.  Also need to figure out with on-call cardiology in the a.m. about pericardiocentesis as well either need to to consult intervention cardiology versus IR for pericardiocentesis.  Tereasa Coop, MD [facility] 04/12/2023, 12:52 AM

## 2023-04-12 NOTE — Consult Note (Addendum)
Renal Service Consult Note [facility]  Stacey Chambers 04/12/2023 Stacey Krabbe, MD Requesting Physician: Dr. David Stall  Reason for Consult: ESRD pt w/ afib / RVR HPI: The patient is a 72 y.o. year-old w/ PMH as below who presented to ED sent from dialysis for high heart rates. Also c/o SOB and chest pain, abd pain and sore throat. Pt was dx'd w/ new onset afib. Hx of pacemaker. Started on IV cardizem and IV heparin and metoprolol. Pt admitted. We are asked to see for dialysis.   Pt seen in ED. She is worried about her breathing. Not in distress but states she can't lie down due to SOB. No CP at this time.   ROS - denies CP, no joint pain, no HA, no blurry vision, no rash, no diarrhea, no nausea/ vomiting, no dysuria, no difficulty voiding   Past Medical History  Past Medical History:  Diagnosis Date   Anemia    Cirrhosis (HCC)    Diabetes mellitus without complication (HCC)    ESRD (end stage renal disease) on dialysis (HCC)    Hypertension    Type 2 diabetes mellitus (HCC)    Vision loss    Past Surgical History  Past Surgical History:  Procedure Laterality Date   AV FISTULA PLACEMENT Left 12/23/2021   Procedure: INSERTION OF LEFT ARM BRACHIOCEPHALIC ARTERIOVENOUS FISTULA;  Surgeon: Cephus Shelling, MD;  Location: [facility] OR;  Service: Vascular;  Laterality: Left;   BACK SURGERY     COLONOSCOPY     IR FLUORO GUIDE CV LINE RIGHT  12/16/2021   IR US GUIDE VASC ACCESS RIGHT  12/16/2021   PACEMAKER LEADLESS INSERTION N/A 01/15/2023   Procedure: PACEMAKER LEADLESS INSERTION;  Surgeon: Lanier Prude, MD;  Location: [facility] INVASIVE CV LAB;  Service: Cardiovascular;  Laterality: N/A;   TEMPORARY PACEMAKER N/A 01/13/2023   Procedure: TEMPORARY PACEMAKER;  Surgeon: Orbie Pyo, MD;  Location: [facility] INVASIVE CV LAB;  Service: Cardiovascular;  Laterality: N/A;   UPPER GASTROINTESTINAL ENDOSCOPY     Family History  Family History  Problem Relation Age of  Onset   Diabetes Brother    Colon cancer Neg Hx    Esophageal cancer Neg Hx    Stomach cancer Neg Hx    Pancreatic cancer Neg Hx    Inflammatory bowel disease Neg Hx    Liver disease Neg Hx    Rectal cancer Neg Hx    Colon polyps Neg Hx    Social History  reports that she has never smoked. She has never used smokeless tobacco. She reports that she does not currently use drugs after having used the following drugs: Marijuana. She reports that she does not drink alcohol. Allergies No Known Allergies Home medications Prior to Admission medications   Medication Sig Start Date End Date Taking? Authorizing Provider  acetaminophen (TYLENOL) 500 MG tablet Take 500 mg by mouth every 8 (eight) hours as needed for moderate pain.   Yes [provider]  albuterol (VENTOLIN HFA) 108 (90 Base) MCG/ACT inhaler Inhale 1-2 puffs into the lungs every 6 (six) hours as needed for wheezing or shortness of breath. 12/06/22  Yes [provider]  amLODipine (NORVASC) 5 MG tablet Take 1 tablet (5 mg total) by mouth daily. Patient taking differently: Take 10 mg by mouth daily. 01/18/23  Yes Tat, Onalee Hua, MD  guaiFENesin (ROBITUSSIN) 100 MG/5ML liquid Take 5 mLs by mouth every 4 (four) hours as needed for cough or to loosen phlegm. 10/27/22  Yes Willeen Niece, MD  hydrALAZINE (APRESOLINE) 50 MG tablet Take 1 tablet (50 mg total) by mouth 3 (three) times daily. 12/19/22 04/12/23 Yes BranchAlben Spittle, MD  lidocaine-prilocaine (EMLA) cream Apply 1 Application topically once. 12/19/22  Yes [provider]  mirtazapine (REMERON) 15 MG tablet Take 15 mg by mouth See admin instructions. Take 1 tablet by mouth nightly on non-dialysis days 10/10/22  Yes [provider]  sevelamer carbonate (RENVELA) 800 MG tablet Take 1 tablet (800 mg total) by mouth 3 (three) times daily with meals. Patient taking differently: Take 1,600 mg by mouth 3 (three) times daily with meals. 10/27/22  Yes Willeen Niece, MD   B Complex-C-Zn-Folic Acid (DIALYVITE 800 WITH ZINC) 0.8 MG TABS Take 1 tablet by mouth at bedtime. Patient not taking: Reported on 04/12/2023 03/17/23   [provider]  losartan (COZAAR) 100 MG tablet Take 100 mg by mouth daily. Patient not taking: Reported on 04/12/2023 12/06/22   [provider]     Vitals:   04/12/23 0215 04/12/23 0300 04/12/23 0556 04/12/23 0634  BP:  100/84  125/69  Pulse:  63  73  Resp:  (!) 31  18  Temp: 98.2 F (36.8 C)  98.3 F (36.8 C)   TempSrc: Oral  Oral   SpO2:  92%  90%  Weight:      Height:       Exam Gen alert, no distress, [place] O2 No rash, cyanosis or gangrene Sclera anicteric, throat clear  No jvd or bruits Chest bilat crackles 1/3 up post, no wheezing RRR no MRG Abd soft ntnd no mass or ascites +bs GU defer MS no joint effusions or deformity Ext no LE or UE edema, no wounds or ulcers Neuro is alert, Ox 3 , nf    LUA AVF+bruit       Renal-related home meds: - norvasc 10  - hydralazine 50 bid - renvela 2 ac - losartan 100 every day - dialyvite every day   OP HD: SW MWF 3.5h  400/1.5   61.5kg   2/2 bath  AVF   Heparin none - last OP HD 10/9, post wt 65.5kg - comes 1-2kg over usually  - good compliance - mircera 75 q 2, last 10/7, due 10/21  - venofer 100 three times per week thru 10/28 - hectorol 2 ug10/7   Assessment/ Plan: Afib w/ RVR - rx'd w/ IV diltiazem, IV heparin and metoprolol. Per pmd/ cards.  ESRD - on HD MWF. Missed most of her HD yesterday. HD today and tomorrow.  HTN - BP 100- 125 here, hold BP lowering meds until we can get the volume down w/ HD.  Volume - having resp issues, CXR c/w vasc congestion. She will be the next patient upstairs for dialysis. Max UF w/ HD.  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 Next esa due 10/21. Follow.  MBD ckd - CCa in range, add on phos. Cont IV vdra and renvela as binder.      Vinson Moselle  MD [facility] 04/12/2023, 8:30 AM  Recent Labs  Lab 04/11/23 0951  04/11/23 2300 04/12/23 0013 04/12/23 0416 04/12/23 0510  HGB 8.8*  --  9.2* 8.9*  --   ALBUMIN 3.2*  --   --   --  3.2*  CALCIUM 8.1* 8.4*  --   --  8.9  CREATININE 3.06* 4.50*  --   --  5.11*  K 3.3* 4.0 4.6  --  3.7   Inpatient medications:  insulin aspart  0-6 Units Subcutaneous TID WC   metoprolol tartrate  25 mg Oral BID    acetaminophen **OR** acetaminophen, albuterol, ondansetron **OR** ondansetron (ZOFRAN) IV

## 2023-04-13 ENCOUNTER — Inpatient Hospital Stay (HOSPITAL_COMMUNITY): Payer: 59

## 2023-04-13 ENCOUNTER — Other Ambulatory Visit (HOSPITAL_COMMUNITY): Payer: Self-pay

## 2023-04-13 ENCOUNTER — Encounter (HOSPITAL_COMMUNITY): Payer: Self-pay | Admitting: Internal Medicine

## 2023-04-13 DIAGNOSIS — I3139 Other pericardial effusion (noninflammatory): Secondary | ICD-10-CM

## 2023-04-13 DIAGNOSIS — N186 End stage renal disease: Secondary | ICD-10-CM | POA: Diagnosis not present

## 2023-04-13 DIAGNOSIS — I4891 Unspecified atrial fibrillation: Secondary | ICD-10-CM | POA: Diagnosis not present

## 2023-04-13 DIAGNOSIS — I5032 Chronic diastolic (congestive) heart failure: Secondary | ICD-10-CM | POA: Diagnosis not present

## 2023-04-13 LAB — CBC
HCT: 27 % — ABNORMAL LOW (ref 36.0–46.0)
Hemoglobin: 9.2 g/dL — ABNORMAL LOW (ref 12.0–15.0)
MCH: 35.2 pg — ABNORMAL HIGH (ref 26.0–34.0)
MCHC: 34.1 g/dL (ref 30.0–36.0)
MCV: 103.4 fL — ABNORMAL HIGH (ref 80.0–100.0)
Platelets: 127 10*3/uL — ABNORMAL LOW (ref 150–400)
RBC: 2.61 MIL/uL — ABNORMAL LOW (ref 3.87–5.11)
RDW: 14.4 % (ref 11.5–15.5)
WBC: 6.7 10*3/uL (ref 4.0–10.5)
nRBC: 0 % (ref 0.0–0.2)

## 2023-04-13 LAB — HIGH SENSITIVITY CRP: CRP, High Sensitivity: 116.89 mg/L — ABNORMAL HIGH (ref 0.00–3.00)

## 2023-04-13 LAB — GLUCOSE, BODY FLUID OTHER: Glucose, Body Fluid Other: 91 mg/dL

## 2023-04-13 LAB — ECHOCARDIOGRAM LIMITED
Height: 61 in
Weight: 2335.11 [oz_av]

## 2023-04-13 LAB — GLUCOSE, CAPILLARY
Glucose-Capillary: 105 mg/dL — ABNORMAL HIGH (ref 70–99)
Glucose-Capillary: 114 mg/dL — ABNORMAL HIGH (ref 70–99)
Glucose-Capillary: 114 mg/dL — ABNORMAL HIGH (ref 70–99)
Glucose-Capillary: 142 mg/dL — ABNORMAL HIGH (ref 70–99)
Glucose-Capillary: 136 mg/dL — ABNORMAL HIGH (ref 70–99)

## 2023-04-13 LAB — BASIC METABOLIC PANEL
Anion gap: 14 (ref 5–15)
BUN: 15 mg/dL (ref 8–23)
CO2: 28 mmol/L (ref 22–32)
Calcium: 8.7 mg/dL — ABNORMAL LOW (ref 8.9–10.3)
Chloride: 91 mmol/L — ABNORMAL LOW (ref 98–111)
Creatinine, Ser: 3.52 mg/dL — ABNORMAL HIGH (ref 0.44–1.00)
GFR, Estimated: 13 mL/min — ABNORMAL LOW (ref >60)
Glucose, Bld: 165 mg/dL — ABNORMAL HIGH (ref 70–99)
Potassium: 3.3 mmol/L — ABNORMAL LOW (ref 3.5–5.1)
Sodium: 133 mmol/L — ABNORMAL LOW (ref 135–145)

## 2023-04-13 LAB — HEPATITIS B SURFACE ANTIBODY, QUANTITATIVE: Hep B S AB Quant (Post): 441 m[IU]/mL

## 2023-04-13 LAB — CYTOLOGY - NON PAP

## 2023-04-13 LAB — MAGNESIUM: Magnesium: 2 mg/dL (ref 1.7–2.4)

## 2023-04-13 MED ORDER — POLYETHYLENE GLYCOL 3350 17 G PO PACK
17.0000 g | PACK | Freq: Two times a day (BID) | ORAL | Status: AC
  Administered 2023-04-13 – 2023-04-15 (×4): 17 g via ORAL
  Filled 2023-04-13 (×4): qty 1

## 2023-04-13 MED ORDER — POTASSIUM CHLORIDE CRYS ER 20 MEQ PO TBCR: 40.0000 meq | EXTENDED_RELEASE_TABLET | Freq: Once | ORAL | Status: DC

## 2023-04-13 MED ORDER — DIPHENHYDRAMINE HCL 25 MG PO CAPS
25.0000 mg | ORAL_CAPSULE | Freq: Four times a day (QID) | ORAL | Status: DC | PRN
  Administered 2023-04-13 – 2023-04-19 (×5): 25 mg via ORAL
  Filled 2023-04-13 (×6): qty 1

## 2023-04-13 MED ORDER — GUAIFENESIN ER 600 MG PO TB12
600.0000 mg | ORAL_TABLET | Freq: Two times a day (BID) | ORAL | Status: DC | PRN
  Administered 2023-04-13: 600 mg via ORAL
  Filled 2023-04-13: qty 1

## 2023-04-13 MED ORDER — HYDROCODONE-ACETAMINOPHEN 5-325 MG PO TABS
1.0000 | ORAL_TABLET | Freq: Four times a day (QID) | ORAL | Status: DC | PRN
  Administered 2023-04-13 – 2023-04-19 (×8): 1 via ORAL
  Filled 2023-04-13 (×8): qty 1

## 2023-04-13 NOTE — TOC Benefit Eligibility Note (Signed)
Patient Product/process development scientist completed.    The patient is insured through [facility]. Patient has Medicare and is not eligible for a copay card, but may be able to apply for patient assistance, if available.    Ran test claim for Eliquis 5 mg and the current 30 day co-pay is $0.00.  Ran test claim for Xarelto 20 mg and the current 30 day co-pay is $0.00.  This test claim was processed through [facility]- copay amounts may vary at other pharmacies due to pharmacy/plan contracts, or as the patient moves through the different stages of their insurance plan.     Roland Earl, CPHT Pharmacy Technician III Certified Patient Advocate [facility] Patient Advocate Team Direct Number: [phone]  Fax: [phone]

## 2023-04-13 NOTE — Progress Notes (Signed)
Patient Name: Stacey Chambers Date of Encounter: 04/13/2023 [facility] Cardiologist: Maisie Fus, MD   Interval Summary  .    Breathing much better - feels better. Less WOB. + soreness @ drain site & abdominal fullness.  BPs initially low o/n - but better this AM.   Vital Signs .    Vitals:   04/13/23 0500 04/13/23 0530 04/13/23 0600 04/13/23 0655  BP: (!) 106/58  (!) 104/59   Pulse: 79  78   Resp: (!) 21  17   Temp:    98.4 F (36.9 C)  TempSrc:    Oral  SpO2: 93%  93%   Weight:  66.2 kg    Height:        Intake/Output Summary (Last 24 hours) at 04/13/2023 0828 Last data filed at 04/13/2023 0800 Gross per 24 hour  Intake 535 ml  Output 2246 ml  Net -1711 ml      04/13/2023    5:30 AM 04/11/2023    3:00 PM 03/20/2023    9:22 AM  Last 3 Weights  Weight (lbs) 145 lb 15.1 oz 134 lb 7.7 oz 142 lb 12.8 oz  Weight (kg) 66.2 kg 61 kg 64.774 kg      Telemetry/ECG    Sinus rhythm  - Personally Reviewed  Relevant CV Studies TTE 04/12/2023:  1. Left ventricular ejection fraction, by estimation, is 60 to 65%. The left ventricle has normal function. The left ventricle has no regional wall motion abnormalities. Left ventricular diastolic parameters are  consistent with Grade I diastolic dysfunction (impaired relaxation).   2. Right ventricular systolic function is normal. The right ventricular size is normal. There is normal pulmonary artery systolic pressure.   3. Pericardial effusion measures up to 3.2 cm. Comapred with the echo 09/2022, pericardial effusion is much larger. Echo findings of diastolic RA and RV collapse as well as >25% respiratory inflow varation are suggestive  of cardiac tamponade. The IVC is not dilated, but doesn't collapse, indicating RA pressure 8 mmHg. Suggest clinical correlation as patient has several features suggesting that she is at risk for tamponade. Large pericardial effusion. The pericardial  effusion is circumferential.    4. The mitral valve is normal in structure. Mild mitral valve regurgitation. No evidence of mitral stenosis.   5. The aortic valve was not well visualized. There is mild calcification of the aortic valve. There is mild thickening of the aortic valve. Aortic valve regurgitation is not visualized. No aortic stenosis is present.   6. The inferior vena cava is normal in size with <50% respiratory variability, suggesting right atrial pressure of 8 mmHg.   Pericardiocentesis 04/12/23: Successful pericardial drain placement with decrease in pericardial pressure from 28 mmHg to 11 mmHg after evacuation of 400 cc of dark bloody fluid. The drain was secured in place and the patient was transported 2H unit for further evaluation and treatment.    Physical Exam .   GEN: No acute distress.  Looks much more comfortable, but profoundly tender @ Pericardial Drain insertion, chest wall & epigastric.  Neck: + JVD Cardiac: Mildly muffled S1 & S2, RRR, no murmurs, or gallops. Soft Rub Respiratory: Diminished in bases GI: Soft, + epigastric & LUQ tenderness, mildly distended abdomen  MS: No edema  Assessment & Plan .     72 y.o. female with a hx of ESRD, alcohol cirrhosis, hypertension, DM, anemia, complete heart block s/p leadless PPM who is being seen 04/11/2023 for the evaluation of paroxysmal  afib at the request of Dr. Posey Rea and Evlyn Kanner PA-C.   Atrial fibrillation Hx CHB s/p leadless PPM -- history of CHB leading to placement of micra device earlier this year developed first documented occurrence of afib during dialysis session 10/9.  -- converted with IV cardizem. Remains in NSR => converted to BB, -- continue Metoprolol Tartrate 25mg  BID => consolidate to Toprol on d/c -- will need anticoagulation, but will wait at least 2-3 week post pericardiocentesis to ensure no re-accumulation prior to initiation of DOAC.    Pericardial effusion Large pericardial effusion with features concerning for  tamponade physiology -  ? No RV collapse b/ volume overload from lack of HD.  ? Related to  Pericardiocentesis yesterday - ~450+ ml bloody fluid with drain to suction canister -> continues to drain, significant drop in pericardial pressures => Continues to draining pretty steady drainage o/n She will remain in CVICU with pericardial drain in place and we will reassess output on a daily basis.  => LImited 2D Echo for Saturday to reassess.  Would want to see minimal drainage prior to removal of drain -> has notable pain/tenderness in chest (likely pericarditic & also from drain) -> reluctant to use NSAID with concern for bleed -> with her having cough as well, will use Hydrocodone-Guaifenesin for combined pain & antitussive Rx.     HFpEF - EF stable. -- GDMT limited by ESRD => low dose BB for now => consolidate to Toprolon d/c.    Per primary team: DM -per primary team ESRD - per Nephrology.  She should be fine for dialysis after the pericardiocentesis but would probably best to be done in the CVICU. Anemia of chronic disease - per Nephrology Hypokalemia - per Nephrology  For questions or updates, please contact [facility] Please consult [email or web address] for contact info under   Signed, Bryan Lemma, MD    Marykay Lex, MD, MS Bryan Lemma, M.D., M.S. Interventional Cardiologist  [facility]  Pager # [phone] Phone # [phone] [address] [place], [place] [address]

## 2023-04-13 NOTE — Progress Notes (Addendum)
   04/13/23 0456  Vitals  Temp 98.4 F (36.9 C)  Temp Source Oral  BP 99/70  MAP (mmHg) 80  BP Location Right Arm  BP Method Automatic  Patient Position (if appropriate) Lying  Pulse Rate 74  ECG Heart Rate 74  Resp 20  Oxygen Therapy  SpO2 94 %  O2 Device Room Air  During Treatment Monitoring  Blood Flow Rate (mL/min) 199 mL/min  Arterial Pressure (mmHg) -94.54 mmHg  Venous Pressure (mmHg) 11.11 mmHg  TMP (mmHg) -108.48 mmHg  Ultrafiltration Rate (mL/min) 1219 mL/min  Dialysate Flow Rate (mL/min) 300 ml/min  Dialysate Potassium Concentration 3  Dialysate Calcium Concentration 2.5  Duration of HD Treatment -hour(s) 2.9 hour(s)  Cumulative Fluid Removed (mL) per Treatment  2075.05  HD Safety Checks Performed Yes  Intra-Hemodialysis Comments Tx completed  Post Treatment  Dialyzer Clearance Clear  Liters Processed 60.4  Fluid Removed (mL) 2100 mL  Tolerated HD Treatment Yes  Post-Hemodialysis Comments  (Blood return 20 minutes prior to end due to high TMP( clotting venous chamber))  AVG/AVF Arterial Site Held (minutes) 8 minutes  AVG/AVF Venous Site Held (minutes) 8 minutes   Pt request 16 gauge needles for H.D. Blood flow set to 359ml/hr. Net UF 2100 Hand off to [employer].

## 2023-04-13 NOTE — Progress Notes (Signed)
[facility] Progress Note  Subjective: had pericardiocentesis yest w/ 400 cc fluid removed. Drain in place. 2.1 L removed w/ HD yest.   Vitals:   04/13/23 1000 04/13/23 1100 04/13/23 1107 04/13/23 1200  BP:  (!) 105/54    Pulse: 77 66 64 67  Resp: 19 17 20 17   Temp:   98.6 F (37 C)   TempSrc:   Oral   SpO2: 95% 95% 95% 94%  Weight:      Height:        Exam: Gen alert, no distress, [place] O2 No jvd or bruits Chest mild rales L base, R clear, better RRR no MRG Abd soft ntnd no mass or ascites +bs Ext no LE or UE edema Neuro is alert, Ox 3 , nf    LUA AVF+bruit      Renal-related home meds: - norvasc 10  - hydralazine 50 bid - renvela 2 ac - losartan 100 every day - dialyvite every day    OP HD: SW MWF 3.5h  400/1.5   61.5kg   2/2 bath  AVF   Heparin none - last OP HD 10/9, post wt 65.5kg - comes 1-2kg over usually  - good compliance - mircera 75 q 2, last 10/7, due 10/21  - venofer 100 three times per week thru 10/28 - hectorol 2 ug10/7    CXR 10/09 - vasc congestion, CM   Assessment/ Plan: Pericardial effusion - w/ suspected tamponade by echo reading. Underwent pericardiocentesis yesterday w/ 400 cc fluid removal. Drain in place. Per cards.  Afib w/ RVR - getting metoprolol. Anticoagulation on hold due to #1.  ESRD - on HD MWF. Had HD last night, HD again tonight to get back on schedule.  HTN - BP's low-normal, on metoprolol. Continue.  Volume - breathing better. 4kg up today. UF goal 2.5- 3 L tonight.  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 Next esa due 10/21. Follow.  MBD ckd - CCa in range, phos a bit high. Cont IV vdra and renvela as binder.             Vinson Moselle MD  [facility] 04/13/2023, 1:43 PM  Recent Labs  Lab 04/11/23 0951 04/11/23 2300 04/12/23 0416 04/12/23 0510 04/13/23 0908  HGB 8.8*   < > 8.9*  --  9.2*  ALBUMIN 3.2*  --   --  3.2*  --   CALCIUM 8.1*   < >  --  8.9 8.7*  PHOS  --   --   --  6.3*  --   CREATININE 3.06*   < >  --   5.11* 3.52*  K 3.3*   < >  --  3.7 3.3*   < > = values in this interval not displayed.   No results for input(s): "IRON", "TIBC", "FERRITIN" in the last 168 hours. Inpatient medications:  Chlorhexidine Gluconate Cloth  6 each Topical Q0600   Chlorhexidine Gluconate Cloth  6 each Topical Q0600   doxercalciferol  2 mcg Intravenous Q T,Th,Sa-HD   insulin aspart  0-6 Units Subcutaneous TID WC   metoprolol tartrate  25 mg Oral BID   sevelamer carbonate  1,600 mg Oral TID WC    anticoagulant sodium citrate     acetaminophen **OR** acetaminophen, albuterol, alteplase, anticoagulant sodium citrate, diphenhydrAMINE, feeding supplement (NEPRO CARB STEADY), guaiFENesin, heparin, HYDROcodone-acetaminophen, lidocaine (PF), lidocaine-prilocaine, melatonin, ondansetron **OR** ondansetron (ZOFRAN) IV, pentafluoroprop-tetrafluoroeth

## 2023-04-13 NOTE — Progress Notes (Signed)
IV Access:   Peripheral IV   Procedures and diagnostic studies:   CARDIAC CATHETERIZATION  Result Date: 04/12/2023 1.  Successful pericardial drain placement with decrease in pericardial pressure from 28 mmHg to 11 mmHg after evacuation of 400 cc of  dark bloody fluid.  The drain was secured in place and the patient was transported 2H unit for further evaluation and treatment.   ECHOCARDIOGRAM COMPLETE  Result Date: 04/12/2023    ECHOCARDIOGRAM REPORT   Patient Name:   Stacey Chambers Date of Exam: 04/12/2023 Medical Rec #:  [id number]             Height:       61.0 in Accession #:    [id number]            Weight:       134.5 lb Date of Birth:  December 01, 1950            BSA:          1.596 m Patient Age:    72 years              BP:           124/77 mmHg Patient Gender: F                     HR:           67 bpm. Exam Location:  Inpatient Procedure: 2D Echo, Cardiac Doppler and Color Doppler Indications:    I31.3 Pericardial effusion (noninflammatory)  History:        Patient has prior history of Echocardiogram examinations, most                 recent 09/16/2022. Risk Factors:Non-Smoker and Hypertension.  Sonographer:    Dondra Prader RVT RCS Referring Phys: [phone] LINDSAY B ROBERTS IMPRESSIONS  1. Left ventricular ejection fraction, by estimation, is 60 to 65%. The left ventricle has normal function. The left ventricle has no regional wall motion abnormalities. Left ventricular diastolic parameters are consistent with Grade I diastolic dysfunction (impaired relaxation).  2. Right ventricular systolic function is normal. The right ventricular size is normal. There is normal pulmonary artery systolic pressure.  3. Pericardial effusion measures up to 3.2 cm. Comapred with the echo 09/2022, pericardial effusion is much larger. Echo findings of diastolic RA and RV collapse as well as >25% respiratory inflow varation are suggestive of cardiac tamponade. The IVC is not dilated, but doesn't collapse, indicating RA pressure 8 mmHg. Suggest clinical correlation as patient has several features suggesting that she is at risk for tamponade. Large pericardial effusion. The pericardial effusion is circumferential.  4. The mitral valve is normal in structure. Mild mitral  valve regurgitation. No evidence of mitral stenosis.  5. The aortic valve was not well visualized. There is mild calcification of the aortic valve. There is mild thickening of the aortic valve. Aortic valve regurgitation is not visualized. No aortic stenosis is present.  6. The inferior vena cava is normal in size with <50% respiratory variability, suggesting right atrial pressure of 8 mmHg. FINDINGS  Left Ventricle: Left ventricular ejection fraction, by estimation, is 60 to 65%. The left ventricle has normal function. The left ventricle has no regional wall motion abnormalities. The left ventricular internal cavity size was normal in size. There is  no left ventricular hypertrophy. Left ventricular diastolic parameters are consistent with Grade I diastolic dysfunction (impaired relaxation). Right Ventricle: The right ventricular size is normal. No increase in right  IV Access:   Peripheral IV   Procedures and diagnostic studies:   CARDIAC CATHETERIZATION  Result Date: 04/12/2023 1.  Successful pericardial drain placement with decrease in pericardial pressure from 28 mmHg to 11 mmHg after evacuation of 400 cc of  dark bloody fluid.  The drain was secured in place and the patient was transported 2H unit for further evaluation and treatment.   ECHOCARDIOGRAM COMPLETE  Result Date: 04/12/2023    ECHOCARDIOGRAM REPORT   Patient Name:   Stacey Chambers Date of Exam: 04/12/2023 Medical Rec #:  [id number]             Height:       61.0 in Accession #:    [id number]            Weight:       134.5 lb Date of Birth:  December 01, 1950            BSA:          1.596 m Patient Age:    72 years              BP:           124/77 mmHg Patient Gender: F                     HR:           67 bpm. Exam Location:  Inpatient Procedure: 2D Echo, Cardiac Doppler and Color Doppler Indications:    I31.3 Pericardial effusion (noninflammatory)  History:        Patient has prior history of Echocardiogram examinations, most                 recent 09/16/2022. Risk Factors:Non-Smoker and Hypertension.  Sonographer:    Dondra Prader RVT RCS Referring Phys: [phone] LINDSAY B ROBERTS IMPRESSIONS  1. Left ventricular ejection fraction, by estimation, is 60 to 65%. The left ventricle has normal function. The left ventricle has no regional wall motion abnormalities. Left ventricular diastolic parameters are consistent with Grade I diastolic dysfunction (impaired relaxation).  2. Right ventricular systolic function is normal. The right ventricular size is normal. There is normal pulmonary artery systolic pressure.  3. Pericardial effusion measures up to 3.2 cm. Comapred with the echo 09/2022, pericardial effusion is much larger. Echo findings of diastolic RA and RV collapse as well as >25% respiratory inflow varation are suggestive of cardiac tamponade. The IVC is not dilated, but doesn't collapse, indicating RA pressure 8 mmHg. Suggest clinical correlation as patient has several features suggesting that she is at risk for tamponade. Large pericardial effusion. The pericardial effusion is circumferential.  4. The mitral valve is normal in structure. Mild mitral  valve regurgitation. No evidence of mitral stenosis.  5. The aortic valve was not well visualized. There is mild calcification of the aortic valve. There is mild thickening of the aortic valve. Aortic valve regurgitation is not visualized. No aortic stenosis is present.  6. The inferior vena cava is normal in size with <50% respiratory variability, suggesting right atrial pressure of 8 mmHg. FINDINGS  Left Ventricle: Left ventricular ejection fraction, by estimation, is 60 to 65%. The left ventricle has normal function. The left ventricle has no regional wall motion abnormalities. The left ventricular internal cavity size was normal in size. There is  no left ventricular hypertrophy. Left ventricular diastolic parameters are consistent with Grade I diastolic dysfunction (impaired relaxation). Right Ventricle: The right ventricular size is normal. No increase in right  [facility] PROGRESS NOTE    Progress Note  Stacey Chambers  ZOX:[id number] DOB: Sep 24, 1950 DOA: 04/11/2023 PCP: Corliss Blacker, MD     Brief Narrative:   Stacey Chambers is an 72 y.o. 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  Accompanied with shortness of breath and chest pain, sore throat poor appetite and abdominal pain.   Assessment/Plan:   New onset atrial fibrillation in a patient with a history of complete heart block with leadless pacemaker: Her on diltiazem now has been discontinued. Continue metoprolol and only heparin for DVT prophylaxis, anticoagulation per cardiology. Cardiology is on board was concerned that her A-fib might be related to her progression of her pericardial effusion.  Pericardial effusion: Small in March cardiology is concerned this has progressed as chest x-ray showed concerning increases in size. 2D echo showed an EF of 50% grade 1 diastolic dysfunction but a significantly larger effusion compared to previous with diastolic right atrium and RV collapse with respiration greater than 25%.  IVC is not dilated. No physiologic tamponade blood pressure was stable not tachycardic 145 cc of bloody fluid overnight. 400 cc serosanguinous fluid on initial pericardiocentesis. Further management per cards. Cytology sent.  Chronic diastolic heart failure: Last EF of 60% in March 2024 no wall motion abnormality Probably some of her epigastric discomfort is due to vascular congestion. GDMT limited by ESRD. Renal was consulted she status post HD on 04/12/2023. Currently on 2 L of oxygen satting greater 93%. Wean to RA. Hopefully dialysis will continue to help with this. No further orthopnea.  Essential  hypertension: Resume Cozaar, hydralazine and amlodipine.  Blood pressure is improved this morning.  Diabetes mellitus type 2 with complications (HCC): Started on sliding scale insulin blood glucose fairly controlled.  End stage renal disease on dialysis [facility]) Renal has been notified.  Usually dialyzes Tuesday Thursdays and Saturdays. HD on 04/12/2023 about 1200 cc negative. Renal recommended to dialyze today again.  Hypokalemia: Per-renal.  Flulike symptoms: She status post vaccine, respiratory panel is negative CT chest showed large pleural effusion with high attenuation.  With asymmetric interstitial pulmonary edema and bilateral effusions right greater than left, has mild cirrhosis.  Cirrhosis: Noted.  LFTs unremarkable, albumin 3.2.    DVT prophylaxis: lovenox Family Communication:none Status is: Inpatient 80Remains inpatient appropriate because: A-fib with RVR and pericardial effusion    Code Status:     Code Status Orders  (From admission, onward)           Start     Ordered   04/11/23 1628  Full code  Continuous       Question:  By:  Answer:  Default: patient does not have capacity for decision making, no surrogate or prior directive available   04/11/23 1628           Code Status History     Date Active Date Inactive Code Status Order ID Comments User Context   01/13/2023 1910 01/17/2023 2356 Full Code [id number]  Simonne Martinet, NP Inpatient   10/20/2022 0847 10/27/2022 2041 Full Code [id number]  Gleason, Markus Jarvis ED   09/16/2022 0422 09/22/2022 0105 Full Code [id number]  Lurline Del, MD ED   12/01/2021 1825 12/28/2021 0014 Full Code [id number]  Merlene Laughter, DO Inpatient  [facility] PROGRESS NOTE    Progress Note  Stacey Chambers  ZOX:[id number] DOB: Sep 24, 1950 DOA: 04/11/2023 PCP: Corliss Blacker, MD     Brief Narrative:   Stacey Chambers is an 72 y.o. 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  Accompanied with shortness of breath and chest pain, sore throat poor appetite and abdominal pain.   Assessment/Plan:   New onset atrial fibrillation in a patient with a history of complete heart block with leadless pacemaker: Her on diltiazem now has been discontinued. Continue metoprolol and only heparin for DVT prophylaxis, anticoagulation per cardiology. Cardiology is on board was concerned that her A-fib might be related to her progression of her pericardial effusion.  Pericardial effusion: Small in March cardiology is concerned this has progressed as chest x-ray showed concerning increases in size. 2D echo showed an EF of 50% grade 1 diastolic dysfunction but a significantly larger effusion compared to previous with diastolic right atrium and RV collapse with respiration greater than 25%.  IVC is not dilated. No physiologic tamponade blood pressure was stable not tachycardic 145 cc of bloody fluid overnight. 400 cc serosanguinous fluid on initial pericardiocentesis. Further management per cards. Cytology sent.  Chronic diastolic heart failure: Last EF of 60% in March 2024 no wall motion abnormality Probably some of her epigastric discomfort is due to vascular congestion. GDMT limited by ESRD. Renal was consulted she status post HD on 04/12/2023. Currently on 2 L of oxygen satting greater 93%. Wean to RA. Hopefully dialysis will continue to help with this. No further orthopnea.  Essential  hypertension: Resume Cozaar, hydralazine and amlodipine.  Blood pressure is improved this morning.  Diabetes mellitus type 2 with complications (HCC): Started on sliding scale insulin blood glucose fairly controlled.  End stage renal disease on dialysis [facility]) Renal has been notified.  Usually dialyzes Tuesday Thursdays and Saturdays. HD on 04/12/2023 about 1200 cc negative. Renal recommended to dialyze today again.  Hypokalemia: Per-renal.  Flulike symptoms: She status post vaccine, respiratory panel is negative CT chest showed large pleural effusion with high attenuation.  With asymmetric interstitial pulmonary edema and bilateral effusions right greater than left, has mild cirrhosis.  Cirrhosis: Noted.  LFTs unremarkable, albumin 3.2.    DVT prophylaxis: lovenox Family Communication:none Status is: Inpatient 80Remains inpatient appropriate because: A-fib with RVR and pericardial effusion    Code Status:     Code Status Orders  (From admission, onward)           Start     Ordered   04/11/23 1628  Full code  Continuous       Question:  By:  Answer:  Default: patient does not have capacity for decision making, no surrogate or prior directive available   04/11/23 1628           Code Status History     Date Active Date Inactive Code Status Order ID Comments User Context   01/13/2023 1910 01/17/2023 2356 Full Code [id number]  Simonne Martinet, NP Inpatient   10/20/2022 0847 10/27/2022 2041 Full Code [id number]  Gleason, Markus Jarvis ED   09/16/2022 0422 09/22/2022 0105 Full Code [id number]  Lurline Del, MD ED   12/01/2021 1825 12/28/2021 0014 Full Code [id number]  Merlene Laughter, DO Inpatient  IV Access:   Peripheral IV   Procedures and diagnostic studies:   CARDIAC CATHETERIZATION  Result Date: 04/12/2023 1.  Successful pericardial drain placement with decrease in pericardial pressure from 28 mmHg to 11 mmHg after evacuation of 400 cc of  dark bloody fluid.  The drain was secured in place and the patient was transported 2H unit for further evaluation and treatment.   ECHOCARDIOGRAM COMPLETE  Result Date: 04/12/2023    ECHOCARDIOGRAM REPORT   Patient Name:   Stacey Chambers Date of Exam: 04/12/2023 Medical Rec #:  [id number]             Height:       61.0 in Accession #:    [id number]            Weight:       134.5 lb Date of Birth:  December 01, 1950            BSA:          1.596 m Patient Age:    72 years              BP:           124/77 mmHg Patient Gender: F                     HR:           67 bpm. Exam Location:  Inpatient Procedure: 2D Echo, Cardiac Doppler and Color Doppler Indications:    I31.3 Pericardial effusion (noninflammatory)  History:        Patient has prior history of Echocardiogram examinations, most                 recent 09/16/2022. Risk Factors:Non-Smoker and Hypertension.  Sonographer:    Dondra Prader RVT RCS Referring Phys: [phone] LINDSAY B ROBERTS IMPRESSIONS  1. Left ventricular ejection fraction, by estimation, is 60 to 65%. The left ventricle has normal function. The left ventricle has no regional wall motion abnormalities. Left ventricular diastolic parameters are consistent with Grade I diastolic dysfunction (impaired relaxation).  2. Right ventricular systolic function is normal. The right ventricular size is normal. There is normal pulmonary artery systolic pressure.  3. Pericardial effusion measures up to 3.2 cm. Comapred with the echo 09/2022, pericardial effusion is much larger. Echo findings of diastolic RA and RV collapse as well as >25% respiratory inflow varation are suggestive of cardiac tamponade. The IVC is not dilated, but doesn't collapse, indicating RA pressure 8 mmHg. Suggest clinical correlation as patient has several features suggesting that she is at risk for tamponade. Large pericardial effusion. The pericardial effusion is circumferential.  4. The mitral valve is normal in structure. Mild mitral  valve regurgitation. No evidence of mitral stenosis.  5. The aortic valve was not well visualized. There is mild calcification of the aortic valve. There is mild thickening of the aortic valve. Aortic valve regurgitation is not visualized. No aortic stenosis is present.  6. The inferior vena cava is normal in size with <50% respiratory variability, suggesting right atrial pressure of 8 mmHg. FINDINGS  Left Ventricle: Left ventricular ejection fraction, by estimation, is 60 to 65%. The left ventricle has normal function. The left ventricle has no regional wall motion abnormalities. The left ventricular internal cavity size was normal in size. There is  no left ventricular hypertrophy. Left ventricular diastolic parameters are consistent with Grade I diastolic dysfunction (impaired relaxation). Right Ventricle: The right ventricular size is normal. No increase in right  [facility] PROGRESS NOTE    Progress Note  Stacey Chambers  ZOX:[id number] DOB: Sep 24, 1950 DOA: 04/11/2023 PCP: Corliss Blacker, MD     Brief Narrative:   Stacey Chambers is an 72 y.o. 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  Accompanied with shortness of breath and chest pain, sore throat poor appetite and abdominal pain.   Assessment/Plan:   New onset atrial fibrillation in a patient with a history of complete heart block with leadless pacemaker: Her on diltiazem now has been discontinued. Continue metoprolol and only heparin for DVT prophylaxis, anticoagulation per cardiology. Cardiology is on board was concerned that her A-fib might be related to her progression of her pericardial effusion.  Pericardial effusion: Small in March cardiology is concerned this has progressed as chest x-ray showed concerning increases in size. 2D echo showed an EF of 50% grade 1 diastolic dysfunction but a significantly larger effusion compared to previous with diastolic right atrium and RV collapse with respiration greater than 25%.  IVC is not dilated. No physiologic tamponade blood pressure was stable not tachycardic 145 cc of bloody fluid overnight. 400 cc serosanguinous fluid on initial pericardiocentesis. Further management per cards. Cytology sent.  Chronic diastolic heart failure: Last EF of 60% in March 2024 no wall motion abnormality Probably some of her epigastric discomfort is due to vascular congestion. GDMT limited by ESRD. Renal was consulted she status post HD on 04/12/2023. Currently on 2 L of oxygen satting greater 93%. Wean to RA. Hopefully dialysis will continue to help with this. No further orthopnea.  Essential  hypertension: Resume Cozaar, hydralazine and amlodipine.  Blood pressure is improved this morning.  Diabetes mellitus type 2 with complications (HCC): Started on sliding scale insulin blood glucose fairly controlled.  End stage renal disease on dialysis [facility]) Renal has been notified.  Usually dialyzes Tuesday Thursdays and Saturdays. HD on 04/12/2023 about 1200 cc negative. Renal recommended to dialyze today again.  Hypokalemia: Per-renal.  Flulike symptoms: She status post vaccine, respiratory panel is negative CT chest showed large pleural effusion with high attenuation.  With asymmetric interstitial pulmonary edema and bilateral effusions right greater than left, has mild cirrhosis.  Cirrhosis: Noted.  LFTs unremarkable, albumin 3.2.    DVT prophylaxis: lovenox Family Communication:none Status is: Inpatient 80Remains inpatient appropriate because: A-fib with RVR and pericardial effusion    Code Status:     Code Status Orders  (From admission, onward)           Start     Ordered   04/11/23 1628  Full code  Continuous       Question:  By:  Answer:  Default: patient does not have capacity for decision making, no surrogate or prior directive available   04/11/23 1628           Code Status History     Date Active Date Inactive Code Status Order ID Comments User Context   01/13/2023 1910 01/17/2023 2356 Full Code [id number]  Simonne Martinet, NP Inpatient   10/20/2022 0847 10/27/2022 2041 Full Code [id number]  Gleason, Markus Jarvis ED   09/16/2022 0422 09/22/2022 0105 Full Code [id number]  Lurline Del, MD ED   12/01/2021 1825 12/28/2021 0014 Full Code [id number]  Merlene Laughter, DO Inpatient

## 2023-04-14 ENCOUNTER — Inpatient Hospital Stay (HOSPITAL_COMMUNITY): Payer: 59

## 2023-04-14 DIAGNOSIS — I480 Paroxysmal atrial fibrillation: Secondary | ICD-10-CM

## 2023-04-14 DIAGNOSIS — Z950 Presence of cardiac pacemaker: Secondary | ICD-10-CM | POA: Diagnosis not present

## 2023-04-14 DIAGNOSIS — I3139 Other pericardial effusion (noninflammatory): Secondary | ICD-10-CM

## 2023-04-14 DIAGNOSIS — Z9889 Other specified postprocedural states: Secondary | ICD-10-CM | POA: Diagnosis not present

## 2023-04-14 DIAGNOSIS — I4891 Unspecified atrial fibrillation: Secondary | ICD-10-CM | POA: Diagnosis not present

## 2023-04-14 LAB — GLUCOSE, CAPILLARY
Glucose-Capillary: 114 mg/dL — ABNORMAL HIGH (ref 70–99)
Glucose-Capillary: 95 mg/dL (ref 70–99)
Glucose-Capillary: 136 mg/dL — ABNORMAL HIGH (ref 70–99)
Glucose-Capillary: 113 mg/dL — ABNORMAL HIGH (ref 70–99)

## 2023-04-14 LAB — BASIC METABOLIC PANEL
Anion gap: 13 (ref 5–15)
BUN: 8 mg/dL (ref 8–23)
CO2: 27 mmol/L (ref 22–32)
Calcium: 9.1 mg/dL (ref 8.9–10.3)
Chloride: 95 mmol/L — ABNORMAL LOW (ref 98–111)
Creatinine, Ser: 2.59 mg/dL — ABNORMAL HIGH (ref 0.44–1.00)
GFR, Estimated: 19 mL/min — ABNORMAL LOW (ref >60)
Glucose, Bld: 113 mg/dL — ABNORMAL HIGH (ref 70–99)
Potassium: 3.3 mmol/L — ABNORMAL LOW (ref 3.5–5.1)
Sodium: 135 mmol/L (ref 135–145)

## 2023-04-14 LAB — CBC
HCT: 27.2 % — ABNORMAL LOW (ref 36.0–46.0)
Hemoglobin: 9 g/dL — ABNORMAL LOW (ref 12.0–15.0)
MCH: 34 pg (ref 26.0–34.0)
MCHC: 33.1 g/dL (ref 30.0–36.0)
MCV: 102.6 fL — ABNORMAL HIGH (ref 80.0–100.0)
Platelets: 127 10*3/uL — ABNORMAL LOW (ref 150–400)
RBC: 2.65 MIL/uL — ABNORMAL LOW (ref 3.87–5.11)
RDW: 14.9 % (ref 11.5–15.5)
WBC: 7.1 10*3/uL (ref 4.0–10.5)
nRBC: 0.3 % — ABNORMAL HIGH (ref 0.0–0.2)

## 2023-04-14 LAB — ECHOCARDIOGRAM LIMITED
Height: 61 in
S' Lateral: 3 cm
Weight: 2211.65 [oz_av]

## 2023-04-14 LAB — MAGNESIUM: Magnesium: 2 mg/dL (ref 1.7–2.4)

## 2023-04-14 NOTE — Consult Note (Signed)
[address]       [place] [address]             [phone]           [facility] Medical Record #[id number] Date of Birth: [birth year]  No ref. provider found Stacey Chambers, Donald Pore, MD  Chief Complaint:    Chief Complaint  Patient presents with   Tachycardia   Chest Pain    History of Present Illness:     Asked to see this 72 yo female admitted with RVR atrial fibrillation. 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 Was taken for pericardial drain for 400cc dark blood 2 days ago. Echo yesterday and today with ongoing moderate effusion with little drainage from pericardial drain. Pt has no specific complaint currently and hemodynamically stable. On hemodyalisis and also has history of cirrhosis      Past Medical History:  Diagnosis Date   Anemia    Cirrhosis (HCC)    Diabetes mellitus without complication (HCC)    ESRD (end stage renal disease) on dialysis (HCC)    Hypertension    Type 2 diabetes mellitus (HCC)    Vision loss     Past Surgical History:  Procedure Laterality Date   AV FISTULA PLACEMENT Left 12/23/2021   Procedure: INSERTION OF LEFT ARM BRACHIOCEPHALIC ARTERIOVENOUS FISTULA;  Surgeon: Cephus Shelling, MD;  Location: [facility] OR;  Service: Vascular;  Laterality: Left;   BACK SURGERY     COLONOSCOPY     IR FLUORO GUIDE CV LINE RIGHT  12/16/2021   IR US GUIDE VASC ACCESS RIGHT  12/16/2021   PACEMAKER LEADLESS INSERTION N/A 01/15/2023   Procedure: PACEMAKER LEADLESS INSERTION;  Surgeon: Lanier Prude, MD;  Location: [facility] INVASIVE CV LAB;  Service: Cardiovascular;  Laterality: N/A;   PERICARDIOCENTESIS N/A 04/12/2023   Procedure: PERICARDIOCENTESIS;  Surgeon: Orbie Pyo, MD;  Location: [facility] INVASIVE CV LAB;  Service: Cardiovascular;  Laterality: N/A;   TEMPORARY PACEMAKER N/A 01/13/2023   Procedure: TEMPORARY  PACEMAKER;  Surgeon: Orbie Pyo, MD;  Location: [facility] INVASIVE CV LAB;  Service: Cardiovascular;  Laterality: N/A;   UPPER GASTROINTESTINAL ENDOSCOPY      Social History   Tobacco Use  Smoking Status Never  Smokeless Tobacco Never    Social History   Substance and Sexual Activity  Alcohol Use Never   Comment: once in awhile    Social History   Socioeconomic History   Marital status: Married    Spouse name: Not on file   Number of children: Not on file   Years of education: Not on file   Highest education level: Not on file  Occupational History   Not on file  Tobacco Use   Smoking status: Never   Smokeless tobacco: Never  Vaping Use   Vaping status: Never Used  Substance and Sexual Activity   Alcohol use: Never    Comment: once in awhile   Drug use: Not Currently    Types: Marijuana    Comment: Use of THC tea 1-2 times per week   Sexual activity: Not on file  Other Topics Concern   Not on file  Social History Narrative   Not on file   Social Determinants of Health   Financial Resource Strain: Not on file  Food Insecurity: No Food Insecurity (04/12/2023)   Hunger Vital Sign  4 mg  4 mg Intravenous Q6H PRN Lurline Del, MD       pentafluoroprop-tetrafluoroeth Peggye Pitt) aerosol 1 Application  1 Application Topical PRN Delano Metz, MD       polyethylene glycol (MIRALAX / Ethelene Hal) packet 17 g  17 g Oral BID Marinda Elk, MD   17 g at 04/14/23 0919   sevelamer carbonate (RENVELA) tablet 1,600 mg  1,600 mg Oral TID WC Delano Metz, MD   1,600 mg at 04/14/23 1131     Family History  Problem Relation Age of Onset   Diabetes Brother    Colon cancer Neg Hx    Esophageal cancer Neg Hx    Stomach cancer Neg Hx    Pancreatic cancer Neg Hx    Inflammatory bowel disease Neg Hx    Liver disease Neg Hx    Rectal cancer Neg Hx    Colon polyps Neg Hx        Physical Exam: BP 98/85   Pulse 85   Temp 99 F (37.2 C) (Oral)   Resp 20   Ht 5\' 1"  (1.549 m)   Wt 62.7 kg   SpO2 94%   BMI 26.12 kg/m  Comfortable in bed Is hungry Abd soft Pericardial drain clogged   Diagnostic Studies & Laboratory data: I have personally reviewed the following studies and agree with the findings     Recent Radiology Findings:   ECHOCARDIOGRAM LIMITED  Result Date: 04/13/2023    ECHOCARDIOGRAM LIMITED REPORT   Patient Name:   Stacey Chambers Date of Exam: 04/13/2023 Medical Rec #:  [id number]             Height:       61.0 in Accession #:    [id number]            Weight:       145.9 lb Date of Birth:  [birth year]            BSA:          1.652 m Patient Age:    71 years              BP:           117/57 mmHg Patient Gender: F                     HR:           70 bpm. Exam Location:  Inpatient Procedure: Limited Echo Indications:    Pericardial Effusion I31.3  History:        Patient has prior history of Echocardiogram examinations, most                 recent 04/12/2023.   Sonographer:    Harriette Bouillon RDCS Referring Phys: [id number] Orbie Pyo IMPRESSIONS  1. LIMITED ECHO - See report 04/12/2023 for details.  2. Left ventricular ejection fraction, by estimation, is 60 to 65%. The left ventricle has normal function.  3. Moderate pericardial effusion. The pericardial effusion is circumferential. There is no evidence of cardiac tamponade.  4. The inferior vena cava is normal in size with <50% respiratory variability, suggesting right atrial pressure of 8 mmHg. Comparison(s): A prior study was performed on 04/12/23. Reported large pericardial effusion with hemodynamic features suggestive of tamponade. FINDINGS  Left Ventricle: Left ventricular ejection fraction, by estimation, is 60 to 65%. The left ventricle has normal function. Pericardium: A moderately sized pericardial effusion is present. The pericardial effusion is  [address]       [place] [address]             [phone]           [facility] Medical Record #[id number] Date of Birth: [birth year]  No ref. provider found Stacey Chambers, Donald Pore, MD  Chief Complaint:    Chief Complaint  Patient presents with   Tachycardia   Chest Pain    History of Present Illness:     Asked to see this 72 yo female admitted with RVR atrial fibrillation. 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 Was taken for pericardial drain for 400cc dark blood 2 days ago. Echo yesterday and today with ongoing moderate effusion with little drainage from pericardial drain. Pt has no specific complaint currently and hemodynamically stable. On hemodyalisis and also has history of cirrhosis      Past Medical History:  Diagnosis Date   Anemia    Cirrhosis (HCC)    Diabetes mellitus without complication (HCC)    ESRD (end stage renal disease) on dialysis (HCC)    Hypertension    Type 2 diabetes mellitus (HCC)    Vision loss     Past Surgical History:  Procedure Laterality Date   AV FISTULA PLACEMENT Left 12/23/2021   Procedure: INSERTION OF LEFT ARM BRACHIOCEPHALIC ARTERIOVENOUS FISTULA;  Surgeon: Cephus Shelling, MD;  Location: [facility] OR;  Service: Vascular;  Laterality: Left;   BACK SURGERY     COLONOSCOPY     IR FLUORO GUIDE CV LINE RIGHT  12/16/2021   IR US GUIDE VASC ACCESS RIGHT  12/16/2021   PACEMAKER LEADLESS INSERTION N/A 01/15/2023   Procedure: PACEMAKER LEADLESS INSERTION;  Surgeon: Lanier Prude, MD;  Location: [facility] INVASIVE CV LAB;  Service: Cardiovascular;  Laterality: N/A;   PERICARDIOCENTESIS N/A 04/12/2023   Procedure: PERICARDIOCENTESIS;  Surgeon: Orbie Pyo, MD;  Location: [facility] INVASIVE CV LAB;  Service: Cardiovascular;  Laterality: N/A;   TEMPORARY PACEMAKER N/A 01/13/2023   Procedure: TEMPORARY  PACEMAKER;  Surgeon: Orbie Pyo, MD;  Location: [facility] INVASIVE CV [facility];  Service: Cardiovascular;  Laterality: N/A;   UPPER GASTROINTESTINAL ENDOSCOPY      Social History   Tobacco Use  Smoking Status Never  Smokeless Tobacco Never    Social History   Substance and Sexual Activity  Alcohol Use Never   Comment: once in awhile    Social History   Socioeconomic History   Marital status: Married    Spouse name: Not on file   Number of children: Not on file   Years of education: Not on file   Highest education level: Not on file  Occupational History   Not on file  Tobacco Use   Smoking status: Never   Smokeless tobacco: Never  Vaping Use   Vaping status: Never Used  Substance and Sexual Activity   Alcohol use: Never    Comment: once in awhile   Drug use: Not Currently    Types: Marijuana    Comment: Use of THC tea 1-2 times per week   Sexual activity: Not on file  Other Topics Concern   Not on file  Social History Narrative   Not on file   Social Determinants of Health   Financial Resource Strain: Not on file  Food Insecurity: No Food Insecurity (04/12/2023)   Hunger Vital Sign  [address]       [place] [address]             [phone]           [facility] Medical Record #[id number] Date of Birth: [birth year]  No ref. provider found Stacey Chambers, Donald Pore, MD  Chief Complaint:    Chief Complaint  Patient presents with   Tachycardia   Chest Pain    History of Present Illness:     Asked to see this 72 yo female admitted with RVR atrial fibrillation. 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 Was taken for pericardial drain for 400cc dark blood 2 days ago. Echo yesterday and today with ongoing moderate effusion with little drainage from pericardial drain. Pt has no specific complaint currently and hemodynamically stable. On hemodyalisis and also has history of cirrhosis      Past Medical History:  Diagnosis Date   Anemia    Cirrhosis (HCC)    Diabetes mellitus without complication (HCC)    ESRD (end stage renal disease) on dialysis (HCC)    Hypertension    Type 2 diabetes mellitus (HCC)    Vision loss     Past Surgical History:  Procedure Laterality Date   AV FISTULA PLACEMENT Left 12/23/2021   Procedure: INSERTION OF LEFT ARM BRACHIOCEPHALIC ARTERIOVENOUS FISTULA;  Surgeon: Cephus Shelling, MD;  Location: [facility] OR;  Service: Vascular;  Laterality: Left;   BACK SURGERY     COLONOSCOPY     IR FLUORO GUIDE CV LINE RIGHT  12/16/2021   IR US GUIDE VASC ACCESS RIGHT  12/16/2021   PACEMAKER LEADLESS INSERTION N/A 01/15/2023   Procedure: PACEMAKER LEADLESS INSERTION;  Surgeon: Lanier Prude, MD;  Location: [facility] INVASIVE CV [facility];  Service: Cardiovascular;  Laterality: N/A;   PERICARDIOCENTESIS N/A 04/12/2023   Procedure: PERICARDIOCENTESIS;  Surgeon: Orbie Pyo, MD;  Location: [facility] INVASIVE CV LAB;  Service: Cardiovascular;  Laterality: N/A;   TEMPORARY PACEMAKER N/A 01/13/2023   Procedure: TEMPORARY  PACEMAKER;  Surgeon: Orbie Pyo, MD;  Location: [facility] INVASIVE CV [facility];  Service: Cardiovascular;  Laterality: N/A;   UPPER GASTROINTESTINAL ENDOSCOPY      Social History   Tobacco Use  Smoking Status Never  Smokeless Tobacco Never    Social History   Substance and Sexual Activity  Alcohol Use Never   Comment: once in awhile    Social History   Socioeconomic History   Marital status: Married    Spouse name: Not on file   Number of children: Not on file   Years of education: Not on file   Highest education level: Not on file  Occupational History   Not on file  Tobacco Use   Smoking status: Never   Smokeless tobacco: Never  Vaping Use   Vaping status: Never Used  Substance and Sexual Activity   Alcohol use: Never    Comment: once in awhile   Drug use: Not Currently    Types: Marijuana    Comment: Use of THC tea 1-2 times per week   Sexual activity: Not on file  Other Topics Concern   Not on file  Social History Narrative   Not on file   Social Determinants of Health   Financial Resource Strain: Not on file  Food Insecurity: No Food Insecurity (04/12/2023)   Hunger Vital Sign

## 2023-04-14 NOTE — Plan of Care (Signed)
  Problem: Education: Goal: Knowledge of General Education information will improve Description Including pain rating scale, medication(s)/side effects and non-pharmacologic comfort measures Outcome: Progressing   

## 2023-04-14 NOTE — Progress Notes (Signed)
Date: 04/13/2023    ECHOCARDIOGRAM LIMITED REPORT   Patient Name:   Stacey Chambers Date of Exam: 04/13/2023 Medical Rec #:  [id number]             Height:       61.0 in Accession #:    [id number]            Weight:       145.9 lb Date of Birth:  04/08/1951            BSA:           1.652 m Patient Age:    72 years              BP:           117/57 mmHg Patient Gender: F                     HR:           70 bpm. Exam Location:  Inpatient Procedure: Limited Echo Indications:    Pericardial Effusion I31.3  History:        Patient has prior history of Echocardiogram examinations, most                 recent 04/12/2023.  Sonographer:    Harriette Bouillon RDCS Referring Phys: [id number] Orbie Pyo IMPRESSIONS  1. LIMITED ECHO - See report 04/12/2023 for details.  2. Left ventricular ejection fraction, by estimation, is 60 to 65%. The left ventricle has normal function.  3. Moderate pericardial effusion. The pericardial effusion is circumferential. There is no evidence of cardiac tamponade.  4. The inferior vena cava is normal in size with <50% respiratory variability, suggesting right atrial pressure of 8 mmHg. Comparison(s): A prior study was performed on 04/12/23. Reported large pericardial effusion with hemodynamic features suggestive of tamponade. FINDINGS  Left Ventricle: Left ventricular ejection fraction, by estimation, is 60 to 65%. The left ventricle has normal function. Pericardium: A moderately sized pericardial effusion is present. The pericardial effusion is circumferential. There is no evidence of cardiac tamponade. Venous: The inferior vena cava is normal in size with less than 50% respiratory variability, suggesting right atrial pressure of 8 mmHg. [name] Electronically signed by Tessa Lerner Signature Date/Time: 04/13/2023/2:45:43 PM    Final    CARDIAC CATHETERIZATION  Result Date: 04/12/2023 1.  Successful pericardial drain placement with decrease in pericardial pressure from 28 mmHg to 11 mmHg after evacuation of 400 cc of dark bloody fluid.  The drain was secured in place and the patient was transported 2H unit for further evaluation and treatment.   ECHOCARDIOGRAM COMPLETE  Result Date: 04/12/2023    ECHOCARDIOGRAM REPORT   Patient Name:   Stacey Chambers  Date of Exam: 04/12/2023 Medical Rec #:  [id number]             Height:       61.0 in Accession #:    [id number]            Weight:       134.5 lb Date of Birth:  04-21-1951            BSA:          1.596 m Patient Age:    71 years              BP:           124/77 mmHg Patient Gender: F  [facility] PROGRESS NOTE    Progress Note  Stacey Chambers  ZOX:[id number] DOB: 1951-02-24 DOA: 04/11/2023 PCP: Corliss Blacker, MD     Brief Narrative:   Stacey Chambers is an 73 y.o. female past medical history significant for end-stage renal disease on hemodialysis Tuesday Thursday and Saturdays, cirrhosis ,status post pacemaker due to third-degree block, essential hypertension presents to the ED as she was having an increasing heart rate of 130s with dialysis.  Accompanied with shortness of breath and chest pain, sore throat poor appetite and abdominal pain.   Assessment/Plan:   New onset atrial fibrillation in a patient with a history of complete heart block with leadless pacemaker: Her on diltiazem now has been discontinued. Continue metoprolol and only heparin for DVT prophylaxis, anticoagulation per cardiology. Cardiology is on board was concerned that her A-fib might be related to her progression of her pericardial effusion.  Pericardial effusion: 2D echo showed an EF of 50% grade 1 diastolic dysfunction but a significantly larger effusion compared to previous. Status post pericardiocentesis on 04/12/2023 she has put out about 800 cc of bloody serosanguineous fluid. Drain is significantly decreased only 75 cc in the last 24 hours.  Cytologies were sent. Further management per cards. Hemoglobin stable continue to hold anticoagulation.  Chronic diastolic heart failure: Last EF of 60% in March 2024 no wall motion abnormality Probably some of her epigastric discomfort is due to vascular congestion. GDMT limited by ESRD. Renal was consulted she status post HD on 04/12/2023 and then again on 04/13/2023. Currently on 2 L of oxygen satting greater 93%. She has been weaned to room air.  She has no further orthopnea.  Essential hypertension: Blood pressure is well-controlled antihypertensive medications were held hydralazine Norvasc and  losartan.  Diabetes mellitus type 2 with complications (HCC): Started on sliding scale insulin blood glucose fairly controlled.  End stage renal disease on dialysis [facility]) Renal has been notified.  Usually dialyze Monday Wednesdays and Fridays she has been dialyzed twice on this admission.  Hypokalemia: Per-renal.  Flulike symptoms: She status post vaccine, respiratory panel is negative CT chest showed large pleural effusion with high attenuation.  With asymmetric interstitial pulmonary edema and bilateral effusions right greater than left, has mild cirrhosis.  Cirrhosis: Noted.  LFTs unremarkable, albumin 3.2.    DVT prophylaxis: lovenox Family Communication:none Status is: Inpatient 80Remains inpatient appropriate because: A-fib with RVR and pericardial effusion    Code Status:     Code Status Orders  (From admission, onward)           Start     Ordered   04/11/23 1628  Full code  Continuous       Question:  By:  Answer:  Default: patient does not have capacity for decision making, no surrogate or prior directive available   04/11/23 1628           Code Status History     Date Active Date Inactive Code Status Order ID Comments User Context   01/13/2023 1910 01/17/2023 2356 Full Code [id number]  Simonne Martinet, NP Inpatient   10/20/2022 0847 10/27/2022 2041 Full Code [id number]  Gleason, Markus Jarvis ED   09/16/2022 0422 09/22/2022 0105 Full Code [id number]  Lurline Del, MD ED   12/01/2021 1825 12/28/2021 0014 Full Code [id number]  Merlene Laughter, DO Inpatient         IV Access:   Peripheral IV   Procedures and diagnostic studies:   ECHOCARDIOGRAM LIMITED  Result  Date: 04/13/2023    ECHOCARDIOGRAM LIMITED REPORT   Patient Name:   Stacey Chambers Date of Exam: 04/13/2023 Medical Rec #:  [id number]             Height:       61.0 in Accession #:    [id number]            Weight:       145.9 lb Date of Birth:  04/08/1951            BSA:           1.652 m Patient Age:    72 years              BP:           117/57 mmHg Patient Gender: F                     HR:           70 bpm. Exam Location:  Inpatient Procedure: Limited Echo Indications:    Pericardial Effusion I31.3  History:        Patient has prior history of Echocardiogram examinations, most                 recent 04/12/2023.  Sonographer:    Harriette Bouillon RDCS Referring Phys: [id number] Orbie Pyo IMPRESSIONS  1. LIMITED ECHO - See report 04/12/2023 for details.  2. Left ventricular ejection fraction, by estimation, is 60 to 65%. The left ventricle has normal function.  3. Moderate pericardial effusion. The pericardial effusion is circumferential. There is no evidence of cardiac tamponade.  4. The inferior vena cava is normal in size with <50% respiratory variability, suggesting right atrial pressure of 8 mmHg. Comparison(s): A prior study was performed on 04/12/23. Reported large pericardial effusion with hemodynamic features suggestive of tamponade. FINDINGS  Left Ventricle: Left ventricular ejection fraction, by estimation, is 60 to 65%. The left ventricle has normal function. Pericardium: A moderately sized pericardial effusion is present. The pericardial effusion is circumferential. There is no evidence of cardiac tamponade. Venous: The inferior vena cava is normal in size with less than 50% respiratory variability, suggesting right atrial pressure of 8 mmHg. [name] Electronically signed by Tessa Lerner Signature Date/Time: 04/13/2023/2:45:43 PM    Final    CARDIAC CATHETERIZATION  Result Date: 04/12/2023 1.  Successful pericardial drain placement with decrease in pericardial pressure from 28 mmHg to 11 mmHg after evacuation of 400 cc of dark bloody fluid.  The drain was secured in place and the patient was transported 2H unit for further evaluation and treatment.   ECHOCARDIOGRAM COMPLETE  Result Date: 04/12/2023    ECHOCARDIOGRAM REPORT   Patient Name:   Stacey Chambers  Date of Exam: 04/12/2023 Medical Rec #:  [id number]             Height:       61.0 in Accession #:    [id number]            Weight:       134.5 lb Date of Birth:  04-21-1951            BSA:          1.596 m Patient Age:    71 years              BP:           124/77 mmHg Patient Gender: F  [facility] PROGRESS NOTE    Progress Note  Stacey Chambers  ZOX:[id number] DOB: 1951-02-24 DOA: 04/11/2023 PCP: Corliss Blacker, MD     Brief Narrative:   Stacey Chambers is an 73 y.o. female past medical history significant for end-stage renal disease on hemodialysis Tuesday Thursday and Saturdays, cirrhosis ,status post pacemaker due to third-degree block, essential hypertension presents to the ED as she was having an increasing heart rate of 130s with dialysis.  Accompanied with shortness of breath and chest pain, sore throat poor appetite and abdominal pain.   Assessment/Plan:   New onset atrial fibrillation in a patient with a history of complete heart block with leadless pacemaker: Her on diltiazem now has been discontinued. Continue metoprolol and only heparin for DVT prophylaxis, anticoagulation per cardiology. Cardiology is on board was concerned that her A-fib might be related to her progression of her pericardial effusion.  Pericardial effusion: 2D echo showed an EF of 50% grade 1 diastolic dysfunction but a significantly larger effusion compared to previous. Status post pericardiocentesis on 04/12/2023 she has put out about 800 cc of bloody serosanguineous fluid. Drain is significantly decreased only 75 cc in the last 24 hours.  Cytologies were sent. Further management per cards. Hemoglobin stable continue to hold anticoagulation.  Chronic diastolic heart failure: Last EF of 60% in March 2024 no wall motion abnormality Probably some of her epigastric discomfort is due to vascular congestion. GDMT limited by ESRD. Renal was consulted she status post HD on 04/12/2023 and then again on 04/13/2023. Currently on 2 L of oxygen satting greater 93%. She has been weaned to room air.  She has no further orthopnea.  Essential hypertension: Blood pressure is well-controlled antihypertensive medications were held hydralazine Norvasc and  losartan.  Diabetes mellitus type 2 with complications (HCC): Started on sliding scale insulin blood glucose fairly controlled.  End stage renal disease on dialysis [facility]) Renal has been notified.  Usually dialyze Monday Wednesdays and Fridays she has been dialyzed twice on this admission.  Hypokalemia: Per-renal.  Flulike symptoms: She status post vaccine, respiratory panel is negative CT chest showed large pleural effusion with high attenuation.  With asymmetric interstitial pulmonary edema and bilateral effusions right greater than left, has mild cirrhosis.  Cirrhosis: Noted.  LFTs unremarkable, albumin 3.2.    DVT prophylaxis: lovenox Family Communication:none Status is: Inpatient 80Remains inpatient appropriate because: A-fib with RVR and pericardial effusion    Code Status:     Code Status Orders  (From admission, onward)           Start     Ordered   04/11/23 1628  Full code  Continuous       Question:  By:  Answer:  Default: patient does not have capacity for decision making, no surrogate or prior directive available   04/11/23 1628           Code Status History     Date Active Date Inactive Code Status Order ID Comments User Context   01/13/2023 1910 01/17/2023 2356 Full Code [id number]  Simonne Martinet, NP Inpatient   10/20/2022 0847 10/27/2022 2041 Full Code [id number]  Gleason, Markus Jarvis ED   09/16/2022 0422 09/22/2022 0105 Full Code [id number]  Lurline Del, MD ED   12/01/2021 1825 12/28/2021 0014 Full Code [id number]  Merlene Laughter, DO Inpatient         IV Access:   Peripheral IV   Procedures and diagnostic studies:   ECHOCARDIOGRAM LIMITED  Result  Date: 04/13/2023    ECHOCARDIOGRAM LIMITED REPORT   Patient Name:   Stacey Chambers Date of Exam: 04/13/2023 Medical Rec #:  [id number]             Height:       61.0 in Accession #:    [id number]            Weight:       145.9 lb Date of Birth:  04/08/1951            BSA:           1.652 m Patient Age:    72 years              BP:           117/57 mmHg Patient Gender: F                     HR:           70 bpm. Exam Location:  Inpatient Procedure: Limited Echo Indications:    Pericardial Effusion I31.3  History:        Patient has prior history of Echocardiogram examinations, most                 recent 04/12/2023.  Sonographer:    Harriette Bouillon RDCS Referring Phys: [id number] Orbie Pyo IMPRESSIONS  1. LIMITED ECHO - See report 04/12/2023 for details.  2. Left ventricular ejection fraction, by estimation, is 60 to 65%. The left ventricle has normal function.  3. Moderate pericardial effusion. The pericardial effusion is circumferential. There is no evidence of cardiac tamponade.  4. The inferior vena cava is normal in size with <50% respiratory variability, suggesting right atrial pressure of 8 mmHg. Comparison(s): A prior study was performed on 04/12/23. Reported large pericardial effusion with hemodynamic features suggestive of tamponade. FINDINGS  Left Ventricle: Left ventricular ejection fraction, by estimation, is 60 to 65%. The left ventricle has normal function. Pericardium: A moderately sized pericardial effusion is present. The pericardial effusion is circumferential. There is no evidence of cardiac tamponade. Venous: The inferior vena cava is normal in size with less than 50% respiratory variability, suggesting right atrial pressure of 8 mmHg. [name] Electronically signed by Tessa Lerner Signature Date/Time: 04/13/2023/2:45:43 PM    Final    CARDIAC CATHETERIZATION  Result Date: 04/12/2023 1.  Successful pericardial drain placement with decrease in pericardial pressure from 28 mmHg to 11 mmHg after evacuation of 400 cc of dark bloody fluid.  The drain was secured in place and the patient was transported 2H unit for further evaluation and treatment.   ECHOCARDIOGRAM COMPLETE  Result Date: 04/12/2023    ECHOCARDIOGRAM REPORT   Patient Name:   Stacey Chambers  Date of Exam: 04/12/2023 Medical Rec #:  [id number]             Height:       61.0 in Accession #:    [id number]            Weight:       134.5 lb Date of Birth:  04-21-1951            BSA:          1.596 m Patient Age:    71 years              BP:           124/77 mmHg Patient Gender: F  [facility] PROGRESS NOTE    Progress Note  Stacey Chambers  ZOX:[id number] DOB: 1951-02-24 DOA: 04/11/2023 PCP: Corliss Blacker, MD     Brief Narrative:   Stacey Chambers is an 73 y.o. female past medical history significant for end-stage renal disease on hemodialysis Tuesday Thursday and Saturdays, cirrhosis ,status post pacemaker due to third-degree block, essential hypertension presents to the ED as she was having an increasing heart rate of 130s with dialysis.  Accompanied with shortness of breath and chest pain, sore throat poor appetite and abdominal pain.   Assessment/Plan:   New onset atrial fibrillation in a patient with a history of complete heart block with leadless pacemaker: Her on diltiazem now has been discontinued. Continue metoprolol and only heparin for DVT prophylaxis, anticoagulation per cardiology. Cardiology is on board was concerned that her A-fib might be related to her progression of her pericardial effusion.  Pericardial effusion: 2D echo showed an EF of 50% grade 1 diastolic dysfunction but a significantly larger effusion compared to previous. Status post pericardiocentesis on 04/12/2023 she has put out about 800 cc of bloody serosanguineous fluid. Drain is significantly decreased only 75 cc in the last 24 hours.  Cytologies were sent. Further management per cards. Hemoglobin stable continue to hold anticoagulation.  Chronic diastolic heart failure: Last EF of 60% in March 2024 no wall motion abnormality Probably some of her epigastric discomfort is due to vascular congestion. GDMT limited by ESRD. Renal was consulted she status post HD on 04/12/2023 and then again on 04/13/2023. Currently on 2 L of oxygen satting greater 93%. She has been weaned to room air.  She has no further orthopnea.  Essential hypertension: Blood pressure is well-controlled antihypertensive medications were held hydralazine Norvasc and  losartan.  Diabetes mellitus type 2 with complications (HCC): Started on sliding scale insulin blood glucose fairly controlled.  End stage renal disease on dialysis [facility]) Renal has been notified.  Usually dialyze Monday Wednesdays and Fridays she has been dialyzed twice on this admission.  Hypokalemia: Per-renal.  Flulike symptoms: She status post vaccine, respiratory panel is negative CT chest showed large pleural effusion with high attenuation.  With asymmetric interstitial pulmonary edema and bilateral effusions right greater than left, has mild cirrhosis.  Cirrhosis: Noted.  LFTs unremarkable, albumin 3.2.    DVT prophylaxis: lovenox Family Communication:none Status is: Inpatient 80Remains inpatient appropriate because: A-fib with RVR and pericardial effusion    Code Status:     Code Status Orders  (From admission, onward)           Start     Ordered   04/11/23 1628  Full code  Continuous       Question:  By:  Answer:  Default: patient does not have capacity for decision making, no surrogate or prior directive available   04/11/23 1628           Code Status History     Date Active Date Inactive Code Status Order ID Comments User Context   01/13/2023 1910 01/17/2023 2356 Full Code [id number]  Simonne Martinet, NP Inpatient   10/20/2022 0847 10/27/2022 2041 Full Code [id number]  Gleason, Markus Jarvis ED   09/16/2022 0422 09/22/2022 0105 Full Code [id number]  Lurline Del, MD ED   12/01/2021 1825 12/28/2021 0014 Full Code [id number]  Merlene Laughter, DO Inpatient         IV Access:   Peripheral IV   Procedures and diagnostic studies:   ECHOCARDIOGRAM LIMITED  Result  Date: 04/13/2023    ECHOCARDIOGRAM LIMITED REPORT   Patient Name:   Stacey Chambers Date of Exam: 04/13/2023 Medical Rec #:  [id number]             Height:       61.0 in Accession #:    [id number]            Weight:       145.9 lb Date of Birth:  04/08/1951            BSA:           1.652 m Patient Age:    72 years              BP:           117/57 mmHg Patient Gender: F                     HR:           70 bpm. Exam Location:  Inpatient Procedure: Limited Echo Indications:    Pericardial Effusion I31.3  History:        Patient has prior history of Echocardiogram examinations, most                 recent 04/12/2023.  Sonographer:    Harriette Bouillon RDCS Referring Phys: [id number] Orbie Pyo IMPRESSIONS  1. LIMITED ECHO - See report 04/12/2023 for details.  2. Left ventricular ejection fraction, by estimation, is 60 to 65%. The left ventricle has normal function.  3. Moderate pericardial effusion. The pericardial effusion is circumferential. There is no evidence of cardiac tamponade.  4. The inferior vena cava is normal in size with <50% respiratory variability, suggesting right atrial pressure of 8 mmHg. Comparison(s): A prior study was performed on 04/12/23. Reported large pericardial effusion with hemodynamic features suggestive of tamponade. FINDINGS  Left Ventricle: Left ventricular ejection fraction, by estimation, is 60 to 65%. The left ventricle has normal function. Pericardium: A moderately sized pericardial effusion is present. The pericardial effusion is circumferential. There is no evidence of cardiac tamponade. Venous: The inferior vena cava is normal in size with less than 50% respiratory variability, suggesting right atrial pressure of 8 mmHg. [name] Electronically signed by Tessa Lerner Signature Date/Time: 04/13/2023/2:45:43 PM    Final    CARDIAC CATHETERIZATION  Result Date: 04/12/2023 1.  Successful pericardial drain placement with decrease in pericardial pressure from 28 mmHg to 11 mmHg after evacuation of 400 cc of dark bloody fluid.  The drain was secured in place and the patient was transported 2H unit for further evaluation and treatment.   ECHOCARDIOGRAM COMPLETE  Result Date: 04/12/2023    ECHOCARDIOGRAM REPORT   Patient Name:   Stacey Chambers  Date of Exam: 04/12/2023 Medical Rec #:  [id number]             Height:       61.0 in Accession #:    [id number]            Weight:       134.5 lb Date of Birth:  04-21-1951            BSA:          1.596 m Patient Age:    71 years              BP:           124/77 mmHg Patient Gender: F

## 2023-04-14 NOTE — Progress Notes (Addendum)
Patient Name: Stacey Chambers Date of Encounter: 04/14/2023 [facility] Cardiologist: Maisie Fus, MD   Interval Summary  .    CP - tenderness around the drain  No shortness of breath  Completed HD earlier this morning.   Vital Signs .    Vitals:   04/14/23 0740 04/14/23 0800 04/14/23 0900 04/14/23 0919  BP: (!) 102/52 (!) 104/51 (!) 98/47 (!) 98/47  Pulse: 89 86 85 83  Resp: 18 15 18    Temp: 97.9 F (36.6 C)     TempSrc: Oral     SpO2: 94% 93% 96%   Weight:      Height:        Intake/Output Summary (Last 24 hours) at 04/14/2023 1048 Last data filed at 04/14/2023 0941 Gross per 24 hour  Intake 620 ml  Output 3065 ml  Net -2445 ml      04/14/2023    4:00 AM 04/13/2023    5:30 AM 04/11/2023    3:00 PM  Last 3 Weights  Weight (lbs) 138 lb 3.7 oz 145 lb 15.1 oz 134 lb 7.7 oz  Weight (kg) 62.7 kg 66.2 kg 61 kg      Telemetry/ECG    Sinus rhythm  - Personally Reviewed  Relevant CV Studies TTE 04/12/2023:  1. Left ventricular ejection fraction, by estimation, is 60 to 65%. The left ventricle has normal function. The left ventricle has no regional wall motion abnormalities. Left ventricular diastolic parameters are  consistent with Grade I diastolic dysfunction (impaired relaxation).   2. Right ventricular systolic function is normal. The right ventricular size is normal. There is normal pulmonary artery systolic pressure.   3. Pericardial effusion measures up to 3.2 cm. Comapred with the echo 09/2022, pericardial effusion is much larger. Echo findings of diastolic RA and RV collapse as well as >25% respiratory inflow varation are suggestive  of cardiac tamponade. The IVC is not dilated, but doesn't collapse, indicating RA pressure 8 mmHg. Suggest clinical correlation as patient has several features suggesting that she is at risk for tamponade. Large pericardial effusion. The pericardial  effusion is circumferential.   4. The mitral valve is normal  in structure. Mild mitral valve regurgitation. No evidence of mitral stenosis.   5. The aortic valve was not well visualized. There is mild calcification of the aortic valve. There is mild thickening of the aortic valve. Aortic valve regurgitation is not visualized. No aortic stenosis is present.   6. The inferior vena cava is normal in size with <50% respiratory variability, suggesting right atrial pressure of 8 mmHg.   Pericardiocentesis 04/12/23: Successful pericardial drain placement with decrease in pericardial pressure from 28 mmHg to 11 mmHg after evacuation of 400 cc of dark bloody fluid. The drain was secured in place and the patient was transported 2H unit for further evaluation and treatment.   Limited Echo 04/13/2023:  1. LIMITED ECHO - See report 04/12/2023 for details.   2. Left ventricular ejection fraction, by estimation, is 60 to 65%. The  left ventricle has normal function.   3. Moderate pericardial effusion. The pericardial effusion is  circumferential. There is no evidence of cardiac tamponade.   4. The inferior vena cava is normal in size with <50% respiratory  variability, suggesting right atrial pressure of 8 mmHg.   Limited Echo 04/14/2023: Moderate pericardial effusion present.  Final report pending.   Physical Exam .   GEN: No acute distress.  Age appropriate, tender @ Pericardial Drain insertion, chest  wall & epigastric.  Neck: + JVD Cardiac: Mildly muffled S1 & S2, RRR, no murmurs, or gallops. Soft Rub Respiratory: Diminished in bases GI: Soft, + epigastric & LUQ tenderness, mildly distended abdomen  MS: No edema  Assessment & Plan .     72 y.o. female with a hx of ESRD, alcohol cirrhosis, hypertension, DM, anemia, complete heart block s/p leadless PPM who is being seen 04/11/2023 for the evaluation of paroxysmal afib at the request of Dr. Posey Rea and Evlyn Kanner PA-C.    Pericardial effusion Large pericardial effusion with features concerning for tamponade  physiology -   Pericardiocentesis 04/12/2023  She has drained about 75cc over 24 hour; however, echo this morning notes at least moderate size pericardial effusion. Will await final read.  Will keep the drain in place as the size of effusion as not gone down.  Due to decreased drain output concerned for ? Possible drain being clogged. Will consult CT surgery consult for window (reduced output, hypotension). NPO for now. Spoke to CT sx APP (Mr. Hedwig Morton) who is aware of the consult.  Also spoke to Dr. Lalla Brothers from EP this morning regarding her care and hx of leadless pacemaker. Not on NSAIDS due to concern for bleeding  Pain at the drain site remains the same.   Pleural effusion:  Concern for large left pleural effusion on echocardiogram.  Will check CXR.   Atrial fibrillation Hx CHB s/p leadless PPM -- history of CHB leading to placement of micra device earlier this year developed first documented occurrence of afib during dialysis session 10/9.  -- converted with IV cardizem. Remains in NSR => converted to BB, -- continue Metoprolol Tartrate 25mg  BID => consolidate to Toprol on d/c -- will need anticoagulation, but will wait at least 2-3 week post pericardiocentesis to ensure no re-accumulation prior to initiation of DOAC.    HFpEF - EF stable. GDMT limited by ESRD and soft blood pressures => low dose BB for now => consolidate to Toprolon d/c.    Per primary team: DM -per primary team ESRD - per Nephrology.  She should be fine for dialysis after the pericardiocentesis but would probably best to be done in the CVICU. Anemia of chronic disease - per Nephrology Hypokalemia - per Nephrology  For questions or updates, please contact [facility] Please consult [email or web address] for contact info under   Tessa Lerner, DO, [facility]  [facility]  [address] [place], [place] [address] Pager: [phone] Office: [phone] 04/14/2023 10:49 AM

## 2023-04-14 NOTE — Progress Notes (Signed)
[facility] Progress Note  Subjective: had HD yest w/ 3000 cc UF, in good spirits. Lying flat.   Vitals:   04/14/23 0900 04/14/23 0919 04/14/23 1100 04/14/23 1109  BP: (!) 98/47 (!) 98/47 98/85   Pulse: 85 83 86 85  Resp: 18  (!) 23 20  Temp:    99 F (37.2 C)  TempSrc:    Oral  SpO2: 96%  96% 94%  Weight:      Height:        Exam: Gen alert, no distress, [place] O2 No jvd or bruits Chest mild rales L base, R clear, better RRR no MRG Abd soft ntnd no mass or ascites +bs Ext no LE or UE edema Neuro is alert, Ox 3 , nf    LUA AVF+bruit      Renal-related home meds: - norvasc 10  - hydralazine 50 bid - renvela 2 ac - losartan 100 every day - dialyvite every day    OP HD: SW MWF 3.5h  400/1.5   61.5kg   2/2 bath  AVF   Heparin none - last OP HD 10/9, post wt 65.5kg - comes 1-2kg over usually  - good compliance - mircera 75 q 2, last 10/7, due 10/21  - venofer 100 three times per week thru 10/28 - hectorol 2 ug10/7    CXR 10/09 - vasc congestion, CM   Assessment/ Plan: Pericardial effusion - w/ suspected tamponade by echo reading. Underwent pericardiocentesis 10/10 w/ 400 cc fluid removal. Drain in place. Per cards.  Afib w/ RVR - getting metoprolol. Anticoagulation on hold due to #1.  ESRD - on HD MWF. Had HD Thursday and Friday. Next HD Monday.  HTN - BP's low-normal, on metoprolol. Continue.  Volume - breathing better. Had HD thurs and Friday for total UF 5.1 L. Wt's down, now is 1kg over. Euvolemic on exam.  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 Next esa due 10/21. Follow.  MBD ckd - CCa in range, phos a bit high. Cont IV vdra and renvela as binder.             Vinson Moselle MD  [facility] 04/14/2023, 12:06 PM  Recent Labs  Lab 04/11/23 0951 04/11/23 2300 04/12/23 0510 04/13/23 0908 04/14/23 0334  HGB 8.8*   < >  --  9.2* 9.0*  ALBUMIN 3.2*  --  3.2*  --   --   CALCIUM 8.1*   < > 8.9 8.7* 9.1  PHOS  --   --  6.3*  --   --   CREATININE 3.06*   < >  5.11* 3.52* 2.59*  K 3.3*   < > 3.7 3.3* 3.3*   < > = values in this interval not displayed.   No results for input(s): "IRON", "TIBC", "FERRITIN" in the last 168 hours. Inpatient medications:  Chlorhexidine Gluconate Cloth  6 each Topical Q0600   Chlorhexidine Gluconate Cloth  6 each Topical Q0600   doxercalciferol  2 mcg Intravenous Q T,Th,Sa-HD   insulin aspart  0-6 Units Subcutaneous TID WC   metoprolol tartrate  25 mg Oral BID   polyethylene glycol  17 g Oral BID   sevelamer carbonate  1,600 mg Oral TID WC    anticoagulant sodium citrate     acetaminophen **OR** acetaminophen, albuterol, alteplase, anticoagulant sodium citrate, diphenhydrAMINE, feeding supplement (NEPRO CARB STEADY), guaiFENesin, heparin, HYDROcodone-acetaminophen, lidocaine (PF), lidocaine-prilocaine, melatonin, ondansetron **OR** ondansetron (ZOFRAN) IV, pentafluoroprop-tetrafluoroeth

## 2023-04-14 NOTE — Progress Notes (Signed)
PT Cancellation Note  Patient Details Name: Stacey Chambers MRN: [id number] DOB: 22-Aug-1950   Cancelled Treatment:    Reason Eval/Treat Not Completed: Medical issues which prohibited therapy. Pt with clogged pericardial drain and for subxyphoid drainage tomorrow.    Angelina Ok [facility] 04/14/2023, 1:53 PM Skip Mayer PT Acute [employer] [phone]

## 2023-04-14 NOTE — Progress Notes (Signed)
Echocardiogram 2D Echocardiogram has been performed.  Ramzey Petrovic N Giovan Pinsky,RDCS 04/14/2023, 9:13 AM

## 2023-04-15 ENCOUNTER — Encounter (HOSPITAL_COMMUNITY)
Admission: EM | Disposition: A | Discharge status: Home / Self Care | Payer: Self-pay | Source: Home / Self Care | Attending: Internal Medicine

## 2023-04-15 ENCOUNTER — Inpatient Hospital Stay (HOSPITAL_COMMUNITY): Payer: 59

## 2023-04-15 DIAGNOSIS — I3139 Other pericardial effusion (noninflammatory): Secondary | ICD-10-CM | POA: Diagnosis not present

## 2023-04-15 DIAGNOSIS — I4891 Unspecified atrial fibrillation: Secondary | ICD-10-CM | POA: Diagnosis not present

## 2023-04-15 LAB — GLUCOSE, CAPILLARY
Glucose-Capillary: 97 mg/dL (ref 70–99)
Glucose-Capillary: 104 mg/dL — ABNORMAL HIGH (ref 70–99)
Glucose-Capillary: 124 mg/dL — ABNORMAL HIGH (ref 70–99)
Glucose-Capillary: 140 mg/dL — ABNORMAL HIGH (ref 70–99)

## 2023-04-15 LAB — CBC
HCT: 25.1 % — ABNORMAL LOW (ref 36.0–46.0)
Hemoglobin: 8.3 g/dL — ABNORMAL LOW (ref 12.0–15.0)
MCH: 34.2 pg — ABNORMAL HIGH (ref 26.0–34.0)
MCHC: 33.1 g/dL (ref 30.0–36.0)
MCV: 103.3 fL — ABNORMAL HIGH (ref 80.0–100.0)
Platelets: 119 10*3/uL — ABNORMAL LOW (ref 150–400)
RBC: 2.43 MIL/uL — ABNORMAL LOW (ref 3.87–5.11)
RDW: 15.3 % (ref 11.5–15.5)
WBC: 7.4 10*3/uL (ref 4.0–10.5)
nRBC: 0.3 % — ABNORMAL HIGH (ref 0.0–0.2)

## 2023-04-15 LAB — BASIC METABOLIC PANEL
Anion gap: 13 (ref 5–15)
BUN: 21 mg/dL (ref 8–23)
CO2: 26 mmol/L (ref 22–32)
Calcium: 8.7 mg/dL — ABNORMAL LOW (ref 8.9–10.3)
Chloride: 95 mmol/L — ABNORMAL LOW (ref 98–111)
Creatinine, Ser: 4.7 mg/dL — ABNORMAL HIGH (ref 0.44–1.00)
GFR, Estimated: 10 mL/min — ABNORMAL LOW (ref >60)
Glucose, Bld: 107 mg/dL — ABNORMAL HIGH (ref 70–99)
Potassium: 3.7 mmol/L (ref 3.5–5.1)
Sodium: 134 mmol/L — ABNORMAL LOW (ref 135–145)

## 2023-04-15 LAB — TYPE AND SCREEN
ABO/RH(D): O POS
Antibody Screen: NEGATIVE

## 2023-04-15 LAB — SURGICAL PCR SCREEN
MRSA, PCR: NEGATIVE
Staphylococcus aureus: NEGATIVE

## 2023-04-15 LAB — ECHOCARDIOGRAM LIMITED
Height: 61 in
Weight: 2179.91 [oz_av]

## 2023-04-15 LAB — MAGNESIUM: Magnesium: 2.4 mg/dL (ref 1.7–2.4)

## 2023-04-15 SURGERY — CREATION, PERICARDIAL WINDOW, ANTERIOR THORACOTOMY APPROACH
Anesthesia: General

## 2023-04-15 MED ORDER — VANCOMYCIN HCL 1250 MG/250ML IV SOLN
1250.0000 mg | INTRAVENOUS | Status: DC
  Filled 2023-04-15: qty 250

## 2023-04-15 MED ORDER — EPINEPHRINE HCL 5 MG/250ML IV SOLN IN NS
0.0000 ug/min | INTRAVENOUS | Status: DC
  Filled 2023-04-15: qty 250

## 2023-04-15 MED ORDER — MAGNESIUM SULFATE 50 % IJ SOLN
40.0000 meq | INTRAMUSCULAR | Status: DC
  Filled 2023-04-15: qty 9.85

## 2023-04-15 MED ORDER — CEFAZOLIN SODIUM-DEXTROSE 2-4 GM/100ML-% IV SOLN
2.0000 g | INTRAVENOUS | Status: DC
  Filled 2023-04-15: qty 100

## 2023-04-15 MED ORDER — CHLORHEXIDINE GLUCONATE CLOTH 2 % EX PADS
6.0000 | MEDICATED_PAD | Freq: Every day | CUTANEOUS | Status: DC
  Administered 2023-04-16 – 2023-04-19 (×3): 6 via TOPICAL

## 2023-04-15 MED ORDER — INSULIN REGULAR(HUMAN) IN NACL 100-0.9 UT/100ML-% IV SOLN
INTRAVENOUS | Status: DC
  Filled 2023-04-15: qty 100

## 2023-04-15 MED ORDER — TEMAZEPAM 15 MG PO CAPS: 15.0000 mg | ORAL_CAPSULE | Freq: Once | ORAL | Status: DC | PRN

## 2023-04-15 MED ORDER — TRANEXAMIC ACID 1000 MG/10ML IV SOLN
1.5000 mg/kg/h | INTRAVENOUS | Status: DC
  Filled 2023-04-15: qty 25

## 2023-04-15 MED ORDER — BISACODYL 5 MG PO TBEC
5.0000 mg | DELAYED_RELEASE_TABLET | Freq: Once | ORAL | Status: AC
  Administered 2023-04-15: 5 mg via ORAL
  Filled 2023-04-15: qty 1

## 2023-04-15 MED ORDER — HEPARIN 30,000 UNITS/1000 ML (OHS) CELLSAVER SOLUTION
Status: DC
  Filled 2023-04-15: qty 1000

## 2023-04-15 MED ORDER — NITROGLYCERIN IN D5W 200-5 MCG/ML-% IV SOLN
2.0000 ug/min | INTRAVENOUS | Status: DC
  Filled 2023-04-15: qty 250

## 2023-04-15 MED ORDER — TRANEXAMIC ACID (OHS) BOLUS VIA INFUSION
15.0000 mg/kg | INTRAVENOUS | Status: DC
  Filled 2023-04-15: qty 941

## 2023-04-15 MED ORDER — DIPHENHYDRAMINE HCL 12.5 MG/5ML PO ELIX: 12.5000 mg | ORAL_SOLUTION | Freq: Every day | ORAL | Status: DC | PRN

## 2023-04-15 MED ORDER — DIPHENHYDRAMINE HCL 25 MG PO CAPS
25.0000 mg | ORAL_CAPSULE | Freq: Four times a day (QID) | ORAL | Status: DC | PRN
  Administered 2023-04-15 – 2023-04-16 (×3): 25 mg via ORAL
  Filled 2023-04-15 (×3): qty 1

## 2023-04-15 MED ORDER — CHLORHEXIDINE GLUCONATE 0.12 % MT SOLN: 15.0000 mL | Freq: Once | OROMUCOSAL | Status: DC

## 2023-04-15 MED ORDER — PLASMA-LYTE A IV SOLN
INTRAVENOUS | Status: DC
  Filled 2023-04-15: qty 2.5

## 2023-04-15 MED ORDER — TRANEXAMIC ACID (OHS) PUMP PRIME SOLUTION
2.0000 mg/kg | INTRAVENOUS | Status: DC
  Filled 2023-04-15: qty 1.25

## 2023-04-15 MED ORDER — MILRINONE LACTATE IN DEXTROSE 20-5 MG/100ML-% IV SOLN
0.3000 ug/kg/min | INTRAVENOUS | Status: DC
  Filled 2023-04-15: qty 100

## 2023-04-15 MED ORDER — CHLORHEXIDINE GLUCONATE CLOTH 2 % EX PADS: 6.0000 | MEDICATED_PAD | Freq: Once | CUTANEOUS | Status: DC

## 2023-04-15 MED ORDER — CHLORHEXIDINE GLUCONATE CLOTH 2 % EX PADS: 6.0000 | MEDICATED_PAD | Freq: Every day | CUTANEOUS | Status: DC

## 2023-04-15 MED ORDER — POTASSIUM CHLORIDE 2 MEQ/ML IV SOLN
80.0000 meq | INTRAVENOUS | Status: DC
  Filled 2023-04-15 (×2): qty 40

## 2023-04-15 MED ORDER — SORBITOL 70 % SOLN: 60.0000 mL | Freq: Every day | Status: DC | PRN

## 2023-04-15 MED ORDER — PHENYLEPHRINE HCL-NACL 20-0.9 MG/250ML-% IV SOLN
30.0000 ug/min | INTRAVENOUS | Status: DC
  Filled 2023-04-15: qty 250

## 2023-04-15 MED ORDER — CHLORHEXIDINE GLUCONATE CLOTH 2 % EX PADS
6.0000 | MEDICATED_PAD | Freq: Once | CUTANEOUS | Status: AC
  Administered 2023-04-15: 6 via TOPICAL

## 2023-04-15 MED ORDER — METOPROLOL TARTRATE 12.5 MG HALF TABLET: 12.5000 mg | ORAL_TABLET | Freq: Once | ORAL | Status: DC

## 2023-04-15 MED ORDER — DEXMEDETOMIDINE HCL IN NACL 400 MCG/100ML IV SOLN
0.1000 ug/kg/h | INTRAVENOUS | Status: DC
  Filled 2023-04-15: qty 100

## 2023-04-15 MED ORDER — NOREPINEPHRINE 4 MG/250ML-% IV SOLN
0.0000 ug/min | INTRAVENOUS | Status: DC
  Filled 2023-04-15: qty 250

## 2023-04-15 MED ORDER — CHLORHEXIDINE GLUCONATE CLOTH 2 % EX PADS
6.0000 | MEDICATED_PAD | Freq: Every day | CUTANEOUS | Status: DC
  Administered 2023-04-15: 6 via TOPICAL

## 2023-04-15 NOTE — Evaluation (Addendum)
Physical Therapy Evaluation Patient Details Name: Stacey Chambers MRN: [id number] DOB: Nov 16, 1950 Today's Date: 04/15/2023  History of Present Illness  Pt is 72 year old presented to [facility] on  04/11/23 for new onset afib and pericardial effusion. Underwent pericardicentesis on 10/10.  Noted decreased output and echo positive for mod effusion w/dilated IVC, had drain readjustment and output improved with IVC size normal. PMH - pacer, esrd on HD, HTN, DM, alcoholic cirrhosis.  Clinical Impression  Patient presents with decreased mobility due to generalized weakness and decreased activity tolerance.  Currently CGA to S for hallway ambulation with RW.  [name]  Feel she will benefit from skilled PT in the acute setting to progress mobility and allow d/c home with intermittent family support from her cousin.  May benefit from HHPT upon d/c.         If plan is discharge home, recommend the following: A little help with walking and/or transfers;A little help with bathing/dressing/bathroom;Help with stairs or ramp for entrance;Assist for transportation   Can travel by private vehicle        Equipment Recommendations Rolling walker (2 wheels) (youth RW)  Recommendations for Other Services       Functional Status Assessment Patient has had a recent decline in their functional status and demonstrates the ability to make significant improvements in function in a reasonable and predictable amount of time.     Precautions / Restrictions Precautions Precautions: Fall Precaution Comments: pericardial drain Restrictions Weight Bearing Restrictions: No      Mobility  Bed Mobility               General bed mobility comments: seated EOB    Transfers Overall transfer level: Needs assistance Equipment used: Rolling walker (2 wheels) Transfers: Sit to/from Stand Sit to Stand: Contact guard assist           General transfer  comment: for lines/safety    Ambulation/Gait Ambulation/Gait assistance: Contact guard assist, Supervision Gait Distance (Feet): 150 Feet Assistive device: Rolling walker (2 wheels) Gait Pattern/deviations: Step-through pattern, Decreased stride length       General Gait Details: assist for safety/balance  Stairs            Wheelchair Mobility     Tilt Bed    Modified Rankin (Stroke Patients Only)       Balance Overall balance assessment: Needs assistance   Sitting balance-Leahy Scale: Good     Standing balance support: No upper extremity supported Standing balance-Leahy Scale: Fair Standing balance comment: initial standing no walker, though felt legs were weak so using walker for hallway ambulation                             Pertinent Vitals/Pain Pain Assessment Pain Assessment: Faces Faces Pain Scale: Hurts even more Pain Location: back Pain Descriptors / Indicators: Aching Pain Intervention(s): Monitored during session    Home Living Family/patient expects to be discharged to:: Private residence Living Arrangements: Alone   Type of Home: House Home Access: Level entry       Home Layout: One level Home Equipment: None      Prior Function               Mobility Comments: managing on her own, though reports multiple medical issues (spinal fractures, blurry vision-no longer driving, etc; states daughters and grandchildren have not spoken with her in years, her cousin visiting  and she states he is the only one who checks on her       Extremity/Trunk Assessment   Upper Extremity Assessment Upper Extremity Assessment: Generalized weakness    Lower Extremity Assessment Lower Extremity Assessment: Generalized weakness    Cervical / Trunk Assessment Cervical / Trunk Assessment: Kyphotic  Communication   Communication Communication: Other (comment) ([name] speaking though communicating today in [place])  Cognition Arousal:  Alert Behavior During Therapy: WFL for tasks assessed/performed Overall Cognitive Status: Within Functional Limits for tasks assessed                                          General Comments General comments (skin integrity, edema, etc.): VSS on RA; cousin visiting during session    Exercises     Assessment/Plan    PT Assessment Patient needs continued PT services  PT Problem List Decreased strength;Decreased activity tolerance;Decreased balance;Decreased mobility;Cardiopulmonary status limiting activity       PT Treatment Interventions DME instruction;Gait training;Patient/family education;Functional mobility training;Therapeutic activities;Therapeutic exercise;Balance training    PT Goals (Current goals can be found in the Care Plan section)  Acute Rehab PT Goals Patient Stated Goal: return to independent PT Goal Formulation: With patient Time For Goal Achievement: 04/29/23 Potential to Achieve Goals: Good    Frequency Min 1X/week     Co-evaluation               AM-PAC PT "6 Clicks" Mobility  Outcome Measure Help needed turning from your back to your side while in a flat bed without using bedrails?: A Little Help needed moving from lying on your back to sitting on the side of a flat bed without using bedrails?: A Little Help needed moving to and from a bed to a chair (including a wheelchair)?: A Little Help needed standing up from a chair using your arms (e.g., wheelchair or bedside chair)?: A Little Help needed to walk in hospital room?: A Little Help needed climbing 3-5 steps with a railing? : Total 6 Click Score: 16    End of Session Equipment Utilized During Treatment: Gait belt Activity Tolerance: Patient tolerated treatment well Patient left: in bed;with family/visitor present;with call bell/phone within reach   PT Visit Diagnosis: Muscle weakness (generalized) (M62.81)    Time: 4782-9562 PT Time Calculation (min) (ACUTE ONLY): 23  min   Charges:   PT Evaluation $PT Eval Moderate Complexity: 1 Mod PT Treatments $Gait Training: 8-22 mins PT General Charges $$ ACUTE PT VISIT: 1 Visit         Sheran Lawless, PT Acute Rehabilitation Services [phone] 04/15/2023   [name] 04/15/2023, 5:21 PM

## 2023-04-15 NOTE — Progress Notes (Signed)
Echocardiogram 2D Echocardiogram has been performed.  Warren Lacy Jazier Mcglamery RDCS 04/15/2023, 7:46 AM

## 2023-04-15 NOTE — Progress Notes (Signed)
ECHOCARDIOGRAM LIMITED  Result Date: 04/13/2023    ECHOCARDIOGRAM LIMITED REPORT   Patient Name:   Stacey Chambers Date of Exam: 04/13/2023 Medical Rec #:  [id number]             Height:       61.0 in Accession #:    [id number]            Weight:       145.9 lb Date of Birth:  12-31-1950            BSA:          1.652 m Patient Age:    72 years              BP:           117/57 mmHg Patient Gender: F                     HR:           70 bpm. Exam Location:  Inpatient Procedure: Limited Echo Indications:    Pericardial Effusion I31.3  History:        Patient has prior history of Echocardiogram examinations, most                 recent 04/12/2023.  Sonographer:    Harriette Bouillon RDCS Referring Phys: [id number] Orbie Pyo IMPRESSIONS  1. LIMITED ECHO - See report 04/12/2023 for details.  2. Left ventricular ejection fraction, by estimation, is 60 to 65%. The left ventricle has normal function.  3. Moderate pericardial effusion. The pericardial effusion is circumferential. There is no evidence of cardiac tamponade.  4. The inferior vena cava is normal in size with <50% respiratory variability, suggesting right atrial pressure of 8 mmHg. Comparison(s): A prior study was performed on 04/12/23. Reported large pericardial effusion with hemodynamic features suggestive of tamponade. FINDINGS  Left Ventricle: Left ventricular ejection fraction, by estimation, is 60 to 65%. The left ventricle has normal function. Pericardium: A moderately sized pericardial effusion is present. The pericardial effusion is circumferential. There is no evidence of cardiac tamponade. Venous:  The inferior vena cava is normal in size with less than 50% respiratory variability, suggesting right atrial pressure of 8 mmHg. [name] Electronically signed by Tessa Lerner Signature Date/Time: 04/13/2023/2:45:43 PM    Final      Medical Consultants:   None.   Subjective:    Stacey Chambers orthopnea has resolved no new complaints this morning.  Objective:    Vitals:   04/15/23 0300 04/15/23 0400 04/15/23 0500 04/15/23 0600  BP: 119/60 120/60 (!) 126/55 137/61  Pulse: 79 83 80 85  Resp: 18 (!) 26 20 20   Temp:      TempSrc:      SpO2: 95% 93% 94% 94%  Weight:      Height:       SpO2: 94 % O2 Flow Rate (L/min): 1 L/min   Intake/Output Summary (Last 24 hours) at 04/15/2023 0629 Last data filed at 04/15/2023 0100 Gross per 24 hour  Intake 580 ml  Output 690 ml  Net -110 ml   Filed Weights   04/11/23 1500 04/13/23 0530 04/14/23 0400  Weight: 61 kg 66.2 kg 62.7 kg    Exam: General exam: In no acute distress. Respiratory system: Good air movement and clear to auscultation. Cardiovascular system: S1 & S2 heard, RRR. No JVD. Gastrointestinal system: Abdomen is nondistended, soft and nontender.  Extremities: No pedal edema. Skin: No rashes,  [facility] PROGRESS NOTE    Progress Note  Stacey Chambers  WUJ:[id number] DOB: 03/28/1951 DOA: 04/11/2023 PCP: Corliss Blacker, MD     Brief Narrative:   Stacey Chambers is an 72 y.o. female past medical history significant for end-stage renal disease on hemodialysis Tuesday Thursday and Saturdays, cirrhosis ,status post pacemaker due to third-degree block, essential hypertension presents to the ED as she was having an increasing heart rate of 130s with dialysis.  Accompanied with shortness of breath and chest pain, sore throat poor appetite and abdominal pain.   Assessment/Plan:   New onset atrial fibrillation in a patient with a history of complete heart block with leadless pacemaker: Diltiazem now has been discontinued. Continue metoprolol and only heparin for DVT prophylaxis, anticoagulation per cardiology. Cardiology is on board was concerned that her A-fib might be related to her progression of her pericardial effusion.  Pericardial effusion: 2D echo showed an EF of 50% grade 1 diastolic dysfunction but a significantly larger effusion compared to previous. Status post pericardiocentesis on 04/12/2023 for drainage has decreased there is a concern that is clotted, CT surgery was consulted and will perform a site for on 04/15/2023. Hemoglobin has drifted down continue to monitor continue to hold anticoagulation. Currently NPO.  Chronic diastolic heart failure: Last EF of 60% in March 2024 no wall motion abnormality Probably some of her epigastric discomfort is due to vascular congestion. GDMT limited by ESRD. Renal was consulted she status post HD on 04/12/2023 and then again on 04/13/2023. Currently on 2 L of oxygen satting greater 93%. She has been weaned to room air.  She has no further orthopnea.  Essential hypertension: Blood pressure is well-controlled antihypertensive medications were held hydralazine Norvasc and losartan.  Diabetes mellitus  type 2 with complications (HCC): Started on sliding scale insulin blood glucose fairly controlled.  End stage renal disease on dialysis [facility]) Renal has been notified.  Usually dialyze Monday Wednesdays and Fridays she has been dialyzed twice on this admission.  Hypokalemia: Per-renal.  Flulike symptoms: She status post vaccine, respiratory panel is negative CT chest showed large pleural effusion with high attenuation.  With asymmetric interstitial pulmonary edema and bilateral effusions right greater than left, has mild cirrhosis.  Cirrhosis: Noted.  LFTs unremarkable, albumin 3.2.    DVT prophylaxis: lovenox Family Communication:none Status is: Inpatient 80Remains inpatient appropriate because: A-fib with RVR and pericardial effusion    Code Status:     Code Status Orders  (From admission, onward)           Start     Ordered   04/11/23 1628  Full code  Continuous       Question:  By:  Answer:  Default: patient does not have capacity for decision making, no surrogate or prior directive available   04/11/23 1628           Code Status History     Date Active Date Inactive Code Status Order ID Comments User Context   01/13/2023 1910 01/17/2023 2356 Full Code [id number]  Simonne Martinet, NP Inpatient   10/20/2022 0847 10/27/2022 2041 Full Code [id number]  Gleason, Markus Jarvis ED   09/16/2022 0422 09/22/2022 0105 Full Code [id number]  Lurline Del, MD ED   12/01/2021 1825 12/28/2021 0014 Full Code [id number]  Merlene Laughter, DO Inpatient         IV Access:   Peripheral IV   Procedures and diagnostic studies:   DG CHEST PORT 1 VIEW  Result  [facility] PROGRESS NOTE    Progress Note  Stacey Chambers  WUJ:[id number] DOB: 03/28/1951 DOA: 04/11/2023 PCP: Corliss Blacker, MD     Brief Narrative:   Stacey Chambers is an 72 y.o. female past medical history significant for end-stage renal disease on hemodialysis Tuesday Thursday and Saturdays, cirrhosis ,status post pacemaker due to third-degree block, essential hypertension presents to the ED as she was having an increasing heart rate of 130s with dialysis.  Accompanied with shortness of breath and chest pain, sore throat poor appetite and abdominal pain.   Assessment/Plan:   New onset atrial fibrillation in a patient with a history of complete heart block with leadless pacemaker: Diltiazem now has been discontinued. Continue metoprolol and only heparin for DVT prophylaxis, anticoagulation per cardiology. Cardiology is on board was concerned that her A-fib might be related to her progression of her pericardial effusion.  Pericardial effusion: 2D echo showed an EF of 50% grade 1 diastolic dysfunction but a significantly larger effusion compared to previous. Status post pericardiocentesis on 04/12/2023 for drainage has decreased there is a concern that is clotted, CT surgery was consulted and will perform a site for on 04/15/2023. Hemoglobin has drifted down continue to monitor continue to hold anticoagulation. Currently NPO.  Chronic diastolic heart failure: Last EF of 60% in March 2024 no wall motion abnormality Probably some of her epigastric discomfort is due to vascular congestion. GDMT limited by ESRD. Renal was consulted she status post HD on 04/12/2023 and then again on 04/13/2023. Currently on 2 L of oxygen satting greater 93%. She has been weaned to room air.  She has no further orthopnea.  Essential hypertension: Blood pressure is well-controlled antihypertensive medications were held hydralazine Norvasc and losartan.  Diabetes mellitus  type 2 with complications (HCC): Started on sliding scale insulin blood glucose fairly controlled.  End stage renal disease on dialysis [facility]) Renal has been notified.  Usually dialyze Monday Wednesdays and Fridays she has been dialyzed twice on this admission.  Hypokalemia: Per-renal.  Flulike symptoms: She status post vaccine, respiratory panel is negative CT chest showed large pleural effusion with high attenuation.  With asymmetric interstitial pulmonary edema and bilateral effusions right greater than left, has mild cirrhosis.  Cirrhosis: Noted.  LFTs unremarkable, albumin 3.2.    DVT prophylaxis: lovenox Family Communication:none Status is: Inpatient 80Remains inpatient appropriate because: A-fib with RVR and pericardial effusion    Code Status:     Code Status Orders  (From admission, onward)           Start     Ordered   04/11/23 1628  Full code  Continuous       Question:  By:  Answer:  Default: patient does not have capacity for decision making, no surrogate or prior directive available   04/11/23 1628           Code Status History     Date Active Date Inactive Code Status Order ID Comments User Context   01/13/2023 1910 01/17/2023 2356 Full Code [id number]  Simonne Martinet, NP Inpatient   10/20/2022 0847 10/27/2022 2041 Full Code [id number]  Gleason, Markus Jarvis ED   09/16/2022 0422 09/22/2022 0105 Full Code [id number]  Lurline Del, MD ED   12/01/2021 1825 12/28/2021 0014 Full Code [id number]  Merlene Laughter, DO Inpatient         IV Access:   Peripheral IV   Procedures and diagnostic studies:   DG CHEST PORT 1 VIEW  Result  [facility] PROGRESS NOTE    Progress Note  Stacey Chambers  WUJ:[id number] DOB: 03/28/1951 DOA: 04/11/2023 PCP: Corliss Blacker, MD     Brief Narrative:   Stacey Chambers is an 72 y.o. female past medical history significant for end-stage renal disease on hemodialysis Tuesday Thursday and Saturdays, cirrhosis ,status post pacemaker due to third-degree block, essential hypertension presents to the ED as she was having an increasing heart rate of 130s with dialysis.  Accompanied with shortness of breath and chest pain, sore throat poor appetite and abdominal pain.   Assessment/Plan:   New onset atrial fibrillation in a patient with a history of complete heart block with leadless pacemaker: Diltiazem now has been discontinued. Continue metoprolol and only heparin for DVT prophylaxis, anticoagulation per cardiology. Cardiology is on board was concerned that her A-fib might be related to her progression of her pericardial effusion.  Pericardial effusion: 2D echo showed an EF of 50% grade 1 diastolic dysfunction but a significantly larger effusion compared to previous. Status post pericardiocentesis on 04/12/2023 for drainage has decreased there is a concern that is clotted, CT surgery was consulted and will perform a site for on 04/15/2023. Hemoglobin has drifted down continue to monitor continue to hold anticoagulation. Currently NPO.  Chronic diastolic heart failure: Last EF of 60% in March 2024 no wall motion abnormality Probably some of her epigastric discomfort is due to vascular congestion. GDMT limited by ESRD. Renal was consulted she status post HD on 04/12/2023 and then again on 04/13/2023. Currently on 2 L of oxygen satting greater 93%. She has been weaned to room air.  She has no further orthopnea.  Essential hypertension: Blood pressure is well-controlled antihypertensive medications were held hydralazine Norvasc and losartan.  Diabetes mellitus  type 2 with complications (HCC): Started on sliding scale insulin blood glucose fairly controlled.  End stage renal disease on dialysis [facility]) Renal has been notified.  Usually dialyze Monday Wednesdays and Fridays she has been dialyzed twice on this admission.  Hypokalemia: Per-renal.  Flulike symptoms: She status post vaccine, respiratory panel is negative CT chest showed large pleural effusion with high attenuation.  With asymmetric interstitial pulmonary edema and bilateral effusions right greater than left, has mild cirrhosis.  Cirrhosis: Noted.  LFTs unremarkable, albumin 3.2.    DVT prophylaxis: lovenox Family Communication:none Status is: Inpatient 80Remains inpatient appropriate because: A-fib with RVR and pericardial effusion    Code Status:     Code Status Orders  (From admission, onward)           Start     Ordered   04/11/23 1628  Full code  Continuous       Question:  By:  Answer:  Default: patient does not have capacity for decision making, no surrogate or prior directive available   04/11/23 1628           Code Status History     Date Active Date Inactive Code Status Order ID Comments User Context   01/13/2023 1910 01/17/2023 2356 Full Code [id number]  Simonne Martinet, NP Inpatient   10/20/2022 0847 10/27/2022 2041 Full Code [id number]  Gleason, Markus Jarvis ED   09/16/2022 0422 09/22/2022 0105 Full Code [id number]  Lurline Del, MD ED   12/01/2021 1825 12/28/2021 0014 Full Code [id number]  Merlene Laughter, DO Inpatient         IV Access:   Peripheral IV   Procedures and diagnostic studies:   DG CHEST PORT 1 VIEW  Result  [facility] PROGRESS NOTE    Progress Note  Stacey Chambers  WUJ:[id number] DOB: 03/28/1951 DOA: 04/11/2023 PCP: Corliss Blacker, MD     Brief Narrative:   Stacey Chambers is an 72 y.o. female past medical history significant for end-stage renal disease on hemodialysis Tuesday Thursday and Saturdays, cirrhosis ,status post pacemaker due to third-degree block, essential hypertension presents to the ED as she was having an increasing heart rate of 130s with dialysis.  Accompanied with shortness of breath and chest pain, sore throat poor appetite and abdominal pain.   Assessment/Plan:   New onset atrial fibrillation in a patient with a history of complete heart block with leadless pacemaker: Diltiazem now has been discontinued. Continue metoprolol and only heparin for DVT prophylaxis, anticoagulation per cardiology. Cardiology is on board was concerned that her A-fib might be related to her progression of her pericardial effusion.  Pericardial effusion: 2D echo showed an EF of 50% grade 1 diastolic dysfunction but a significantly larger effusion compared to previous. Status post pericardiocentesis on 04/12/2023 for drainage has decreased there is a concern that is clotted, CT surgery was consulted and will perform a site for on 04/15/2023. Hemoglobin has drifted down continue to monitor continue to hold anticoagulation. Currently NPO.  Chronic diastolic heart failure: Last EF of 60% in March 2024 no wall motion abnormality Probably some of her epigastric discomfort is due to vascular congestion. GDMT limited by ESRD. Renal was consulted she status post HD on 04/12/2023 and then again on 04/13/2023. Currently on 2 L of oxygen satting greater 93%. She has been weaned to room air.  She has no further orthopnea.  Essential hypertension: Blood pressure is well-controlled antihypertensive medications were held hydralazine Norvasc and losartan.  Diabetes mellitus  type 2 with complications (HCC): Started on sliding scale insulin blood glucose fairly controlled.  End stage renal disease on dialysis [facility]) Renal has been notified.  Usually dialyze Monday Wednesdays and Fridays she has been dialyzed twice on this admission.  Hypokalemia: Per-renal.  Flulike symptoms: She status post vaccine, respiratory panel is negative CT chest showed large pleural effusion with high attenuation.  With asymmetric interstitial pulmonary edema and bilateral effusions right greater than left, has mild cirrhosis.  Cirrhosis: Noted.  LFTs unremarkable, albumin 3.2.    DVT prophylaxis: lovenox Family Communication:none Status is: Inpatient 80Remains inpatient appropriate because: A-fib with RVR and pericardial effusion    Code Status:     Code Status Orders  (From admission, onward)           Start     Ordered   04/11/23 1628  Full code  Continuous       Question:  By:  Answer:  Default: patient does not have capacity for decision making, no surrogate or prior directive available   04/11/23 1628           Code Status History     Date Active Date Inactive Code Status Order ID Comments User Context   01/13/2023 1910 01/17/2023 2356 Full Code [id number]  Simonne Martinet, NP Inpatient   10/20/2022 0847 10/27/2022 2041 Full Code [id number]  Gleason, Markus Jarvis ED   09/16/2022 0422 09/22/2022 0105 Full Code [id number]  Lurline Del, MD ED   12/01/2021 1825 12/28/2021 0014 Full Code [id number]  Merlene Laughter, DO Inpatient         IV Access:   Peripheral IV   Procedures and diagnostic studies:   DG CHEST PORT 1 VIEW  Result  Date: 04/14/2023 CLINICAL DATA:  Chest pain EXAM: PORTABLE CHEST 1 VIEW COMPARISON:  04/11/2023 FINDINGS: Gross cardiomegaly with implantable loop recorder. Diffuse interstitial opacity. No acute osseous findings. IMPRESSION: Gross cardiomegaly with diffuse interstitial opacity, consistent with edema or atypical/viral  infection. No focal airspace opacity. Electronically Signed   By: Jearld Lesch M.D.   On: 04/14/2023 14:33   ECHOCARDIOGRAM LIMITED  Result Date: 04/14/2023    ECHOCARDIOGRAM LIMITED REPORT   Patient Name:   Stacey Chambers Date of Exam: 04/14/2023 Medical Rec #:  [id number]             Height:       61.0 in Accession #:    [id number]            Weight:       138.2 lb Date of Birth:  1950/11/27            BSA:          1.614 m Patient Age:    71 years              BP:           122/59 mmHg Patient Gender: F                     HR:           88 bpm. Exam Location:  Inpatient Procedure: Limited Echo, Color Doppler and Cardiac Doppler Indications:    Pericaridal Effusion  History:        Patient has prior history of Echocardiogram examinations, most                 recent 04/13/2023. Risk Factors:Diabetes and Hypertension.  Sonographer:    Raeford Razor RDCS Referring Phys: [phone] LINDSAY B ROBERTS IMPRESSIONS  1. Left ventricular ejection fraction, by estimation, is 60 to 65%. The left ventricle has normal function.  2. Right ventricular systolic function is normal. The right ventricular size is normal.  3. Moderate pericardial effusion. Large pleural effusion. Comparison(s): No significant change from prior study. Conclusion(s)/Recommendation(s): Does have some RV collapse in early diastole. Inflows are borderline. The IVC is collapsing. Not consistent with cardiac tamponade. FINDINGS  Left Ventricle: Left ventricular ejection fraction, by estimation, is 60 to 65%. The left ventricle has normal function. Right Ventricle: The right ventricular size is normal. Right ventricular systolic function is normal. Pericardium: A moderately sized pericardial effusion is present. Additional Comments: There is a large pleural effusion.  LEFT VENTRICLE PLAX 2D LVIDd:         4.40 cm LVIDs:         3.00 cm LV PW:         0.90 cm LV IVS:        0.90 cm  IVC IVC diam: 2.10 cm LEFT ATRIUM             Index        RIGHT ATRIUM           Index LA diam:        4.30 cm 2.66 cm/m   RA Area:     9.92 cm LA Vol (A2C):   67.2 ml 41.63 ml/m  RA Volume:   19.20 ml 11.89 ml/m LA Vol (A4C):   48.8 ml 30.23 ml/m LA Biplane Vol: 61.7 ml 38.22 ml/m   AORTA Ao Root diam: 1.90 cm Carolan Clines Electronically signed by Carolan Clines Signature Date/Time: 04/14/2023/1:41:57 PM    Final

## 2023-04-15 NOTE — Progress Notes (Signed)
1530:  Dr Jacques Navy notified of pt's increase in bright red bld from pericardial drain from insertion site.  Two saturated dsgs changed since 0800.  Pericardial drain flushed with 5ml NS via stopcock per Dr Jacques Navy.  Order received to flush pericardial drain 5ml q6.  Pericardial drain flushed without difficulty.

## 2023-04-15 NOTE — Progress Notes (Signed)
[facility] Progress Note  Subjective: seen in room, no c/o's today  Vitals:   04/15/23 0400 04/15/23 0500 04/15/23 0600 04/15/23 0819  BP: 120/60 (!) 126/55 137/61   Pulse: 83 80 85   Resp: (!) 26 20 20    Temp: 97.9 F (36.6 C)   97.8 F (36.6 C)  TempSrc: Oral   Oral  SpO2: 93% 94% 94%   Weight: 61.8 kg     Height:        Exam: Gen alert, no distress, [place] O2 No jvd or bruits Chest clear bilaterally RRR no RG, pericardial drain in place R lower chest Abd soft ntnd no mass or ascites +bs Ext no LE or UE edema Neuro is alert, Ox 3 , nf    LUA AVF+bruit      Renal-related home meds: - norvasc 10  - hydralazine 50 bid - renvela 2 ac - losartan 100 every day - dialyvite every day    OP HD: SW MWF 3.5h  400/1.5   61.5kg   2/2 bath  AVF   Heparin none - last OP HD 10/9, post wt 65.5kg - comes 1-2kg over usually  - good compliance - mircera 75 q 2, last 10/7, due 10/21  - venofer 100 three times per week thru 10/28 - hectorol 2 ug10/7    CXR 10/09 - vasc congestion, CM   Assessment/ Plan: Pericardial effusion - w/ suspected tamponade by echo. Underwent pericardiocentesis 10/10 w/ 400 cc fluid removal. Drain in place. May need surgery. Per cardiology.  Afib w/ RVR - getting metoprolol. Anticoagulation on hold due to #1.  ESRD - on HD MWF. Had HD Thursday and Friday here. Next HD Monday.  HTN - BP's low-normal, on metoprolol. Continue.  Volume - breathing better. Had HD thurs and Friday for total UF 5.1 L. At dry wt. UF 1-2 L w/ HD tomorrow.  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 Next esa due 10/21. Follow.  MBD ckd - CCa in range, phos a bit high. Cont IV vdra and renvela as binder.             Vinson Moselle MD  [facility] 04/15/2023, 9:00 AM  Recent Labs  Lab 04/11/23 0951 04/11/23 2300 04/12/23 0510 04/13/23 0908 04/14/23 0334 04/15/23 0334  HGB 8.8*   < >  --    < > 9.0* 8.3*  ALBUMIN 3.2*  --  3.2*  --   --   --   CALCIUM 8.1*   < > 8.9   < > 9.1  8.7*  PHOS  --   --  6.3*  --   --   --   CREATININE 3.06*   < > 5.11*   < > 2.59* 4.70*  K 3.3*   < > 3.7   < > 3.3* 3.7   < > = values in this interval not displayed.   No results for input(s): "IRON", "TIBC", "FERRITIN" in the last 168 hours. Inpatient medications:  Chlorhexidine Gluconate Cloth  6 each Topical Daily   doxercalciferol  2 mcg Intravenous Q T,Th,Sa-HD   epinephrine  0-10 mcg/min Intravenous To OR   heparin sodium (porcine) 2,500 Units, papaverine 30 mg in electrolyte-A (PLASMALYTE-A PH 7.4) 500 mL irrigation   Irrigation To OR   insulin aspart  0-6 Units Subcutaneous TID WC   insulin   Intravenous To OR   magnesium sulfate  40 mEq Other To OR   metoprolol tartrate  25 mg Oral BID  phenylephrine  30-200 mcg/min Intravenous To OR   polyethylene glycol  17 g Oral BID   potassium chloride  80 mEq Other To OR   sevelamer carbonate  1,600 mg Oral TID WC   tranexamic acid  15 mg/kg Intravenous To OR   tranexamic acid  2 mg/kg Intracatheter To OR    anticoagulant sodium citrate      ceFAZolin (ANCEF) IV      ceFAZolin (ANCEF) IV     dexmedetomidine     heparin 30,000 units/NS 1000 mL solution for CELLSAVER     milrinone     nitroGLYCERIN     norepinephrine     tranexamic acid (CYKLOKAPRON) 2,500 mg in sodium chloride 0.9 % 250 mL (10 mg/mL) infusion     vancomycin     acetaminophen **OR** acetaminophen, albuterol, alteplase, anticoagulant sodium citrate, diphenhydrAMINE, feeding supplement (NEPRO CARB STEADY), guaiFENesin, heparin, HYDROcodone-acetaminophen, lidocaine (PF), lidocaine-prilocaine, melatonin, ondansetron **OR** ondansetron (ZOFRAN) IV, pentafluoroprop-tetrafluoroeth

## 2023-04-15 NOTE — Progress Notes (Signed)
[address]       [place] [address]             [phone]      3 Days Post-Op  Procedure(s) (LRB): PERICARDIOCENTESIS (N/A)   Total Length of Stay:  LOS: 4 days    SUBJECTIVE: No complaints  Vitals:   04/15/23 0500 04/15/23 0600  BP: (!) 126/55 137/61  Pulse: 80 85  Resp: 20 20  Temp:    SpO2: 94% 94%    Intake/Output      10/12 0701 10/13 0700 10/13 0701 10/14 0700   P.O. 580    Total Intake(mL/kg) 580 (9.4)    Urine (mL/kg/hr) 0 (0)    Drains 700    Other     Total Output 700    Net -120         Urine Occurrence 2 x        anticoagulant sodium citrate      ceFAZolin (ANCEF) IV      ceFAZolin (ANCEF) IV     dexmedetomidine     heparin 30,000 units/NS 1000 mL solution for CELLSAVER     milrinone     nitroGLYCERIN     norepinephrine     tranexamic acid (CYKLOKAPRON) 2,500 mg in sodium chloride 0.9 % 250 mL (10 mg/mL) infusion     vancomycin      CBC    Component Value Date/Time   WBC 7.4 04/15/2023 0334   RBC 2.43 (L) 04/15/2023 0334   HGB 8.3 (L) 04/15/2023 0334   HGB 11.1 (L) 12/12/2022 0957   HCT 25.1 (L) 04/15/2023 0334   PLT 119 (L) 04/15/2023 0334   PLT 83 (L) 12/12/2022 0957   MCV 103.3 (H) 04/15/2023 0334   MCH 34.2 (H) 04/15/2023 0334   MCHC 33.1 04/15/2023 0334   RDW 15.3 04/15/2023 0334   LYMPHSABS 0.6 (L) 12/12/2022 0957   MONOABS 0.6 12/12/2022 0957   EOSABS 0.1 12/12/2022 0957   BASOSABS 0.0 12/12/2022 0957   CMP     Component Value Date/Time   NA 134 (L) 04/15/2023 0334   K 3.7 04/15/2023 0334   CL 95 (L) 04/15/2023 0334   CO2 26 04/15/2023 0334   GLUCOSE 107 (H) 04/15/2023 0334   BUN 21 04/15/2023 0334   CREATININE 4.70 (H) 04/15/2023 0334   CREATININE 4.04 (H) 12/12/2022 0957   CALCIUM 8.7 (L) 04/15/2023 0334   PROT 7.5 04/12/2023 0510   ALBUMIN 3.2 (L) 04/12/2023 0510   AST 21 04/12/2023 0510   AST 24 12/12/2022 0957   ALT 14 04/12/2023 0510   ALT 11 12/12/2022 0957   ALKPHOS 86  04/12/2023 0510   BILITOT 0.9 04/12/2023 0510   BILITOT 1.2 12/12/2022 0957   GFRNONAA 10 (L) 04/15/2023 0334   GFRNONAA 11 (L) 12/12/2022 0957   ABG    Component Value Date/Time   PHART 7.275 (L) 10/20/2022 1336   PCO2ART 50.7 (H) 10/20/2022 1336   PO2ART 168 (H) 10/20/2022 1336   HCO3 29.9 (H) 04/12/2023 0013   TCO2 31 04/12/2023 0013   ACIDBASEDEF 4.0 (H) 10/20/2022 1336   O2SAT 96 04/12/2023 0013   CBG (last 3)  Recent Labs    04/14/23 1605 04/14/23 2006 04/15/23 0614  GLUCAP 136* 113* 97     ASSESSMENT: Pericardial effusion: large amount out of drain after manipulation yesterday Echo with very small remaining effusion No need for pericardial window Will sign off   Eugenio Hoes,  MD 04/15/2023

## 2023-04-15 NOTE — Progress Notes (Addendum)
Patient Name: Stacey Chambers Date of Encounter: 04/15/2023 [facility] Cardiologist: Maisie Fus, MD   Interval Summary  .    Patient denies chest pain. Some itching at the pericardial drain site. No events overnight. Output via the pericardial drain increased over the last 24 hours.  Vital Signs .    Vitals:   04/15/23 0500 04/15/23 0600 04/15/23 0819 04/15/23 0931  BP: (!) 126/55 137/61  (!) 105/56  Pulse: 80 85  77  Resp: 20 20    Temp:   97.8 F (36.6 C)   TempSrc:   Oral   SpO2: 94% 94%    Weight:      Height:        Intake/Output Summary (Last 24 hours) at 04/15/2023 0956 Last data filed at 04/15/2023 0600 Gross per 24 hour  Intake 480 ml  Output 690 ml  Net -210 ml      04/15/2023    4:00 AM 04/14/2023    4:00 AM 04/13/2023    5:30 AM  Last 3 Weights  Weight (lbs) 136 lb 3.9 oz 138 lb 3.7 oz 145 lb 15.1 oz  Weight (kg) 61.8 kg 62.7 kg 66.2 kg      Telemetry/ECG    Sinus rhythm  - Personally Reviewed  Relevant CV Studies TTE 04/12/2023:  1. Left ventricular ejection fraction, by estimation, is 60 to 65%. The left ventricle has normal function. The left ventricle has no regional wall motion abnormalities. Left ventricular diastolic parameters are  consistent with Grade I diastolic dysfunction (impaired relaxation).   2. Right ventricular systolic function is normal. The right ventricular size is normal. There is normal pulmonary artery systolic pressure.   3. Pericardial effusion measures up to 3.2 cm. Comapred with the echo 09/2022, pericardial effusion is much larger. Echo findings of diastolic RA and RV collapse as well as >25% respiratory inflow varation are suggestive  of cardiac tamponade. The IVC is not dilated, but doesn't collapse, indicating RA pressure 8 mmHg. Suggest clinical correlation as patient has several features suggesting that she is at risk for tamponade. Large pericardial effusion. The pericardial  effusion is  circumferential.   4. The mitral valve is normal in structure. Mild mitral valve regurgitation. No evidence of mitral stenosis.   5. The aortic valve was not well visualized. There is mild calcification of the aortic valve. There is mild thickening of the aortic valve. Aortic valve regurgitation is not visualized. No aortic stenosis is present.   6. The inferior vena cava is normal in size with <50% respiratory variability, suggesting right atrial pressure of 8 mmHg.   Pericardiocentesis 04/12/23: Successful pericardial drain placement with decrease in pericardial pressure from 28 mmHg to 11 mmHg after evacuation of 400 cc of dark bloody fluid. The drain was secured in place and the patient was transported 2H unit for further evaluation and treatment.   Limited Echo 04/13/2023:  1. LIMITED ECHO - See report 04/12/2023 for details.   2. Left ventricular ejection fraction, by estimation, is 60 to 65%. The  left ventricle has normal function.   3. Moderate pericardial effusion. The pericardial effusion is  circumferential. There is no evidence of cardiac tamponade.   4. The inferior vena cava is normal in size with <50% respiratory  variability, suggesting right atrial pressure of 8 mmHg.   Limited Echo 04/14/2023: Moderate pericardial effusion present.  Final report pending.   Limited Echo 04/15/2023: Predominantly small pericardial effusion present. IVC size is now within  normal limits. LVEF remains preserved  Physical Exam .   GEN: No acute distress.  Age appropriate, Neck: - JVD Cardiac: Mildly muffled S1 & S2, RRR, no murmurs, or gallops. LUE AVF + bruit Respiratory: Diminished in bases GI: Soft, + epigastric & LUQ tenderness, mildly distended abdomen  MS: No edema  Assessment & Plan .     72 y.o. female with a hx of ESRD, alcohol cirrhosis, hypertension, DM, anemia, complete heart block s/p leadless PPM who is being seen 04/11/2023 for the evaluation of paroxysmal afib at the  request of Dr. Posey Rea and Evlyn Kanner PA-C.    Pericardial effusion Large pericardial effusion with features concerning for tamponade physiology -   Pericardiocentesis 04/12/2023  Drain output between 10/11 - 10/12 was 75cc over 24 hour. Limited echo in 04/14/2023 still noted moderate-sized pericardial effusion with a dilated IVC.  Due to reduced drainage from the drain CT surgery was consulted for possible surgical intervention.  Dr. Leafy Ro saw the patient in consult and was tentatively planned for a window 04/15/2023. The pericardial drain was readjusted by CT surgery. Drain output between 10/12 -10/13 was 700 cc  Limited echocardiogram this morning notes predominantly small sized pericardial effusion and IVC size is now normal.   Given the amount of pericardial drain output over the last 24 hours we will continue to keep the drain in place. She will need a repeat limited echocardiogram prior to removal.   Follow up on fluid cultures  Not on NSAIDS due to concern for bleeding  Pain at the drain site has improved per patient.   Atrial fibrillation, paroxysmal Hx CHB s/p leadless PPM -- history of CHB leading to placement of micra device earlier this year developed first documented occurrence of afib during dialysis session 10/9.  -- converted with IV cardizem. Remains in NSR => converted to BB, -- continue Metoprolol Tartrate 25mg  BID => consolidate to Toprol on d/c --Normal sinus rhythm on telemetry. -- will need anticoagulation, but will wait at least 2-3 week post pericardiocentesis to ensure no re-accumulation prior to initiation of DOAC.  Will need to be readdressed based on how she does clinically during this hospitalization.   HFpEF - EF stable. GDMT limited by ESRD and soft blood pressures => low dose BB for now => consolidate to Toprol xl  d/c.  With improvement in pericardial drain output patient's blood pressure and heart rate have improved. Will still hold off on up  titration of GDMT at this time.   Per primary team: DM -per primary team ESRD - per Nephrology.  She should be fine for dialysis but would probably best to be done in the CVICU. Anemia of chronic disease - per Nephrology  Will continue to follow the patient with you during this hospitalization.  For questions or updates, please contact [facility] Please consult [email or web address] for contact info under   Tessa Lerner, DO, [facility]  [facility]  [address] [place], [place] [address] Pager: [phone] Office: [phone] 04/15/2023 9:56 AM

## 2023-04-15 NOTE — Plan of Care (Signed)
Pt ambulating in hall with PT.  Continues to bleed at pericardial drain site.

## 2023-04-16 DIAGNOSIS — I3139 Other pericardial effusion (noninflammatory): Secondary | ICD-10-CM | POA: Diagnosis not present

## 2023-04-16 DIAGNOSIS — N186 End stage renal disease: Secondary | ICD-10-CM | POA: Diagnosis not present

## 2023-04-16 DIAGNOSIS — I4891 Unspecified atrial fibrillation: Secondary | ICD-10-CM | POA: Diagnosis not present

## 2023-04-16 DIAGNOSIS — I5032 Chronic diastolic (congestive) heart failure: Secondary | ICD-10-CM | POA: Diagnosis not present

## 2023-04-16 LAB — BASIC METABOLIC PANEL
Anion gap: 13 (ref 5–15)
BUN: 33 mg/dL — ABNORMAL HIGH (ref 8–23)
CO2: 27 mmol/L (ref 22–32)
Calcium: 8.8 mg/dL — ABNORMAL LOW (ref 8.9–10.3)
Chloride: 93 mmol/L — ABNORMAL LOW (ref 98–111)
Creatinine, Ser: 6.72 mg/dL — ABNORMAL HIGH (ref 0.44–1.00)
GFR, Estimated: 6 mL/min — ABNORMAL LOW (ref >60)
Glucose, Bld: 108 mg/dL — ABNORMAL HIGH (ref 70–99)
Potassium: 4.4 mmol/L (ref 3.5–5.1)
Sodium: 133 mmol/L — ABNORMAL LOW (ref 135–145)

## 2023-04-16 LAB — CBC
HCT: 26.6 % — ABNORMAL LOW (ref 36.0–46.0)
Hemoglobin: 8.8 g/dL — ABNORMAL LOW (ref 12.0–15.0)
MCH: 34.9 pg — ABNORMAL HIGH (ref 26.0–34.0)
MCHC: 33.1 g/dL (ref 30.0–36.0)
MCV: 105.6 fL — ABNORMAL HIGH (ref 80.0–100.0)
Platelets: 146 10*3/uL — ABNORMAL LOW (ref 150–400)
RBC: 2.52 MIL/uL — ABNORMAL LOW (ref 3.87–5.11)
RDW: 15.7 % — ABNORMAL HIGH (ref 11.5–15.5)
WBC: 5.9 10*3/uL (ref 4.0–10.5)
nRBC: 0.3 % — ABNORMAL HIGH (ref 0.0–0.2)

## 2023-04-16 LAB — MAGNESIUM: Magnesium: 2.6 mg/dL — ABNORMAL HIGH (ref 1.7–2.4)

## 2023-04-16 LAB — GLUCOSE, CAPILLARY
Glucose-Capillary: 98 mg/dL (ref 70–99)
Glucose-Capillary: 109 mg/dL — ABNORMAL HIGH (ref 70–99)

## 2023-04-16 LAB — BODY FLUID CULTURE W GRAM STAIN: Culture: NO GROWTH

## 2023-04-16 LAB — VITAMIN B12: Vitamin B-12: 1270 pg/mL — ABNORMAL HIGH (ref 180–914)

## 2023-04-16 LAB — FOLATE: Folate: 8.2 ng/mL (ref >5.9)

## 2023-04-16 MED ORDER — ORAL CARE MOUTH RINSE: 15.0000 mL | OROMUCOSAL | Status: DC | PRN

## 2023-04-16 MED ORDER — LIDOCAINE-PRILOCAINE 2.5-2.5 % EX CREA: 1.0000 | TOPICAL_CREAM | CUTANEOUS | Status: DC | PRN

## 2023-04-16 MED ORDER — LIDOCAINE HCL (PF) 1 % IJ SOLN: 5.0000 mL | INTRAMUSCULAR | Status: DC | PRN

## 2023-04-16 MED ORDER — ALTEPLASE 2 MG IJ SOLR: 2.0000 mg | Freq: Once | INTRAMUSCULAR | Status: DC | PRN

## 2023-04-16 MED ORDER — HEPARIN SODIUM (PORCINE) 1000 UNIT/ML DIALYSIS: 1000.0000 [IU] | INTRAMUSCULAR | Status: DC | PRN

## 2023-04-16 MED ORDER — ANTICOAGULANT SODIUM CITRATE 4% (200MG/5ML) IV SOLN: 5.0000 mL | Status: DC | PRN

## 2023-04-16 MED ORDER — NEPRO/CARBSTEADY PO LIQD: 237.0000 mL | ORAL | Status: DC | PRN

## 2023-04-16 MED ORDER — PENTAFLUOROPROP-TETRAFLUOROETH EX AERO: 1.0000 | INHALATION_SPRAY | CUTANEOUS | Status: DC | PRN

## 2023-04-16 NOTE — Procedures (Signed)
HD Note:  Some information was entered later than the data was gathered due to patient care needs. The stated time with the data is accurate.  Received patient in bed to unit.   Alert and oriented.   Informed consent signed and in chart.   Access used: Left upper arm fistula, heavily bruised around the fistula and upper arm Access issues: No issues.  Patient tolerated treatment well.   TX duration: 3hours  Alert, without acute distress.  Total UF removed: 800 ml  Hand-off given to patient's nurse.   Transported back to the room   Jaycub Noorani L. Dareen Piano, RN [facility].

## 2023-04-16 NOTE — Progress Notes (Addendum)
Pt arrived to [facility] w/ transport no complications.

## 2023-04-16 NOTE — Progress Notes (Signed)
[facility] PROGRESS NOTE    Progress Note  Stacey Chambers  YNW:[id number] DOB: 24-Sep-1950 DOA: 04/11/2023 PCP: Corliss Blacker, MD     Brief Narrative:   Stacey Chambers is an 72 y.o. female past medical history significant for end-stage renal disease on hemodialysis Tuesday Thursday and Saturdays, cirrhosis ,status post pacemaker due to third-degree block, essential hypertension presents to the ED as she was having an increasing heart rate of 130s with dialysis.  Accompanied with shortness of breath and chest pain, sore throat poor appetite and abdominal pain.  Started on IV diltiazem then discontinued and changed to oral metoprolol. 2D echo showed an EF of 50% grade 1 diastolic dysfunction but a significantly larger effusion compared to previous.   Assessment/Plan:   New onset atrial fibrillation in a patient with a history of complete heart block with leadless pacemaker: Continue metoprolol and only heparin for DVT prophylaxis, anticoagulation per cardiology. Cardiology is on board was concerned that her A-fib might be related to her progression of her pericardial effusion. She remains in sinus rhythm.  Pericardial effusion: Status post pericardiocentesis on 04/12/2023 for drainage has decreased there is a concern that is clotted, CT surgery drain was manipulated yesterday large amount of fluid came out repeated echo showed small effusion, CT surgery recommended no pericardial window needed.  They have signed off. Continue to hold anticoagulation. Currently NPO.  Chronic diastolic heart failure: Last EF of 60% in March 2024 no wall motion abnormality Probably some of her epigastric discomfort is due to vascular congestion. GDMT limited by ESRD. Renal was consulted she status post HD on 04/12/2023 and then again on 04/13/2023. Currently on 2 L of oxygen satting greater 93%. She has been weaned to room air.  She has no further orthopnea.  Essential  hypertension: Blood pressure is borderline continue to hold Norvasc, hydralazine and losartan.  Diabetes mellitus type 2 with complications (HCC): Started on sliding scale insulin blood glucose fairly controlled.  Chronic thrombocytopenia: Platelets are pending this morning, continue to monitor intermittently.  End stage renal disease on dialysis [facility]) Renal has been notified.  Usually dialyze Monday Wednesdays and Fridays she has been dialyzed twice on this admission.  Hypokalemia: Per-renal.  Flulike symptoms: She status post vaccine, respiratory panel is negative CT chest showed large pleural effusion with high attenuation.  With asymmetric interstitial pulmonary edema and bilateral effusions right greater than left, has mild cirrhosis.  Cirrhosis: Noted.  LFTs unremarkable, albumin 3.2.    DVT prophylaxis: lovenox Family Communication:none Status is: Inpatient 80Remains inpatient appropriate because: A-fib with RVR and pericardial effusion    Code Status:     Code Status Orders  (From admission, onward)           Start     Ordered   04/11/23 1628  Full code  Continuous       Question:  By:  Answer:  Default: patient does not have capacity for decision making, no surrogate or prior directive available   04/11/23 1628           Code Status History     Date Active Date Inactive Code Status Order ID Comments User Context   01/13/2023 1910 01/17/2023 2356 Full Code [id number]  Simonne Martinet, NP Inpatient   10/20/2022 0847 10/27/2022 2041 Full Code [id number]  Gleason, Markus Jarvis ED   09/16/2022 0422 09/22/2022 0105 Full Code [id number]  Lurline Del, MD ED   12/01/2021 1825 12/28/2021 0014 Full Code [id number]  Marland Mcalpine,  Heloise Beecham, DO Inpatient         IV Access:   Peripheral IV   Procedures and diagnostic studies:   ECHOCARDIOGRAM LIMITED  Result Date: 04/15/2023    ECHOCARDIOGRAM LIMITED REPORT   Patient Name:   Stacey Chambers Date of  Exam: 04/15/2023 Medical Rec #:  [id number]             Height:       61.0 in Accession #:    [id number]            Weight:       136.2 lb Date of Birth:  Mar 02, 1951            BSA:          1.604 m Patient Age:    71 years              BP:           102/53 mmHg Patient Gender: F                     HR:           82 bpm. Exam Location:  Inpatient Procedure: Limited Echo Indications:    I31.3 Pericardial effusion  History:        Patient has prior history of Echocardiogram examinations, most                 recent 04/14/2023. Arrythmias:Atrial Fibrillation; Risk                 Factors:Hypertension, Diabetes and ESRD.  Sonographer:    Irving Burton Senior RDCS Referring Phys: [id number] Eugenio Hoes  Sonographer Comments: Limited to check effusion for possible window, Dr. Leafy Ro at bedside. IMPRESSIONS  1. LIMITED ECHO: Full echo was done on 04/12/2023.  2. Left ventricular ejection fraction, by estimation, is 60 to 65%. The left ventricle has normal function.  3. Right ventricular systolic function grossly normal.  4. Predominantly small. The pericardial effusion is circumferential and stranding noted in the pericardial space. There is no evidence of cardiac tamponade.  5. The mitral valve is degenerative.  6. The aortic valve was not well visualized.  7. The inferior vena cava is normal in size with <50% respiratory variability, suggesting right atrial pressure of 8 mmHg. Comparison(s): A prior study was performed on 04/14/2023. Size of the pericardial effusion has reduced from moderate to small. IVC is now normal in size. Conclusion(s)/Recommendation(s): Repeat limited echo prior to drain removal. FINDINGS  Left Ventricle: Left ventricular ejection fraction, by estimation, is 60 to 65%. The left ventricle has normal function. Right Ventricle: Right ventricular systolic function grossly normal. Pericardium: Predominantly small. The pericardial effusion is circumferential and stranding noted in the pericardial space. There is  no evidence of cardiac tamponade. Mitral Valve: The mitral valve is degenerative in appearance. Mild mitral annular calcification. Tricuspid Valve: The tricuspid valve is grossly normal. Aortic Valve: The aortic valve was not well visualized. Venous: The inferior vena cava is normal in size with less than 50% respiratory variability, suggesting right atrial pressure of 8 mmHg. [name] Electronically signed by Tessa Lerner Signature Date/Time: 04/15/2023/9:34:27 AM    Final    DG CHEST PORT 1 VIEW  Result Date: 04/14/2023 CLINICAL DATA:  Chest pain EXAM: PORTABLE CHEST 1 VIEW COMPARISON:  04/11/2023 FINDINGS: Gross cardiomegaly with implantable loop recorder. Diffuse interstitial opacity. No acute osseous findings. IMPRESSION: Gross cardiomegaly with diffuse interstitial opacity, consistent with edema or atypical/viral infection.  [facility] PROGRESS NOTE    Progress Note  Stacey Chambers  YNW:[id number] DOB: 24-Sep-1950 DOA: 04/11/2023 PCP: Corliss Blacker, MD     Brief Narrative:   Stacey Chambers is an 72 y.o. female past medical history significant for end-stage renal disease on hemodialysis Tuesday Thursday and Saturdays, cirrhosis ,status post pacemaker due to third-degree block, essential hypertension presents to the ED as she was having an increasing heart rate of 130s with dialysis.  Accompanied with shortness of breath and chest pain, sore throat poor appetite and abdominal pain.  Started on IV diltiazem then discontinued and changed to oral metoprolol. 2D echo showed an EF of 50% grade 1 diastolic dysfunction but a significantly larger effusion compared to previous.   Assessment/Plan:   New onset atrial fibrillation in a patient with a history of complete heart block with leadless pacemaker: Continue metoprolol and only heparin for DVT prophylaxis, anticoagulation per cardiology. Cardiology is on board was concerned that her A-fib might be related to her progression of her pericardial effusion. She remains in sinus rhythm.  Pericardial effusion: Status post pericardiocentesis on 04/12/2023 for drainage has decreased there is a concern that is clotted, CT surgery drain was manipulated yesterday large amount of fluid came out repeated echo showed small effusion, CT surgery recommended no pericardial window needed.  They have signed off. Continue to hold anticoagulation. Currently NPO.  Chronic diastolic heart failure: Last EF of 60% in March 2024 no wall motion abnormality Probably some of her epigastric discomfort is due to vascular congestion. GDMT limited by ESRD. Renal was consulted she status post HD on 04/12/2023 and then again on 04/13/2023. Currently on 2 L of oxygen satting greater 93%. She has been weaned to room air.  She has no further orthopnea.  Essential  hypertension: Blood pressure is borderline continue to hold Norvasc, hydralazine and losartan.  Diabetes mellitus type 2 with complications (HCC): Started on sliding scale insulin blood glucose fairly controlled.  Chronic thrombocytopenia: Platelets are pending this morning, continue to monitor intermittently.  End stage renal disease on dialysis [facility]) Renal has been notified.  Usually dialyze Monday Wednesdays and Fridays she has been dialyzed twice on this admission.  Hypokalemia: Per-renal.  Flulike symptoms: She status post vaccine, respiratory panel is negative CT chest showed large pleural effusion with high attenuation.  With asymmetric interstitial pulmonary edema and bilateral effusions right greater than left, has mild cirrhosis.  Cirrhosis: Noted.  LFTs unremarkable, albumin 3.2.    DVT prophylaxis: lovenox Family Communication:none Status is: Inpatient 80Remains inpatient appropriate because: A-fib with RVR and pericardial effusion    Code Status:     Code Status Orders  (From admission, onward)           Start     Ordered   04/11/23 1628  Full code  Continuous       Question:  By:  Answer:  Default: patient does not have capacity for decision making, no surrogate or prior directive available   04/11/23 1628           Code Status History     Date Active Date Inactive Code Status Order ID Comments User Context   01/13/2023 1910 01/17/2023 2356 Full Code [id number]  Simonne Martinet, NP Inpatient   10/20/2022 0847 10/27/2022 2041 Full Code [id number]  Gleason, Markus Jarvis ED   09/16/2022 0422 09/22/2022 0105 Full Code [id number]  Lurline Del, MD ED   12/01/2021 1825 12/28/2021 0014 Full Code [id number]  Marland Mcalpine,  [facility] PROGRESS NOTE    Progress Note  Stacey Chambers  YNW:[id number] DOB: 24-Sep-1950 DOA: 04/11/2023 PCP: Corliss Blacker, MD     Brief Narrative:   Stacey Chambers is an 72 y.o. female past medical history significant for end-stage renal disease on hemodialysis Tuesday Thursday and Saturdays, cirrhosis ,status post pacemaker due to third-degree block, essential hypertension presents to the ED as she was having an increasing heart rate of 130s with dialysis.  Accompanied with shortness of breath and chest pain, sore throat poor appetite and abdominal pain.  Started on IV diltiazem then discontinued and changed to oral metoprolol. 2D echo showed an EF of 50% grade 1 diastolic dysfunction but a significantly larger effusion compared to previous.   Assessment/Plan:   New onset atrial fibrillation in a patient with a history of complete heart block with leadless pacemaker: Continue metoprolol and only heparin for DVT prophylaxis, anticoagulation per cardiology. Cardiology is on board was concerned that her A-fib might be related to her progression of her pericardial effusion. She remains in sinus rhythm.  Pericardial effusion: Status post pericardiocentesis on 04/12/2023 for drainage has decreased there is a concern that is clotted, CT surgery drain was manipulated yesterday large amount of fluid came out repeated echo showed small effusion, CT surgery recommended no pericardial window needed.  They have signed off. Continue to hold anticoagulation. Currently NPO.  Chronic diastolic heart failure: Last EF of 60% in March 2024 no wall motion abnormality Probably some of her epigastric discomfort is due to vascular congestion. GDMT limited by ESRD. Renal was consulted she status post HD on 04/12/2023 and then again on 04/13/2023. Currently on 2 L of oxygen satting greater 93%. She has been weaned to room air.  She has no further orthopnea.  Essential  hypertension: Blood pressure is borderline continue to hold Norvasc, hydralazine and losartan.  Diabetes mellitus type 2 with complications (HCC): Started on sliding scale insulin blood glucose fairly controlled.  Chronic thrombocytopenia: Platelets are pending this morning, continue to monitor intermittently.  End stage renal disease on dialysis [facility]) Renal has been notified.  Usually dialyze Monday Wednesdays and Fridays she has been dialyzed twice on this admission.  Hypokalemia: Per-renal.  Flulike symptoms: She status post vaccine, respiratory panel is negative CT chest showed large pleural effusion with high attenuation.  With asymmetric interstitial pulmonary edema and bilateral effusions right greater than left, has mild cirrhosis.  Cirrhosis: Noted.  LFTs unremarkable, albumin 3.2.    DVT prophylaxis: lovenox Family Communication:none Status is: Inpatient 80Remains inpatient appropriate because: A-fib with RVR and pericardial effusion    Code Status:     Code Status Orders  (From admission, onward)           Start     Ordered   04/11/23 1628  Full code  Continuous       Question:  By:  Answer:  Default: patient does not have capacity for decision making, no surrogate or prior directive available   04/11/23 1628           Code Status History     Date Active Date Inactive Code Status Order ID Comments User Context   01/13/2023 1910 01/17/2023 2356 Full Code [id number]  Simonne Martinet, NP Inpatient   10/20/2022 0847 10/27/2022 2041 Full Code [id number]  Gleason, Markus Jarvis ED   09/16/2022 0422 09/22/2022 0105 Full Code [id number]  Lurline Del, MD ED   12/01/2021 1825 12/28/2021 0014 Full Code [id number]  Marland Mcalpine,  [facility] PROGRESS NOTE    Progress Note  Stacey Chambers  YNW:[id number] DOB: 24-Sep-1950 DOA: 04/11/2023 PCP: Corliss Blacker, MD     Brief Narrative:   Stacey Chambers is an 72 y.o. female past medical history significant for end-stage renal disease on hemodialysis Tuesday Thursday and Saturdays, cirrhosis ,status post pacemaker due to third-degree block, essential hypertension presents to the ED as she was having an increasing heart rate of 130s with dialysis.  Accompanied with shortness of breath and chest pain, sore throat poor appetite and abdominal pain.  Started on IV diltiazem then discontinued and changed to oral metoprolol. 2D echo showed an EF of 50% grade 1 diastolic dysfunction but a significantly larger effusion compared to previous.   Assessment/Plan:   New onset atrial fibrillation in a patient with a history of complete heart block with leadless pacemaker: Continue metoprolol and only heparin for DVT prophylaxis, anticoagulation per cardiology. Cardiology is on board was concerned that her A-fib might be related to her progression of her pericardial effusion. She remains in sinus rhythm.  Pericardial effusion: Status post pericardiocentesis on 04/12/2023 for drainage has decreased there is a concern that is clotted, CT surgery drain was manipulated yesterday large amount of fluid came out repeated echo showed small effusion, CT surgery recommended no pericardial window needed.  They have signed off. Continue to hold anticoagulation. Currently NPO.  Chronic diastolic heart failure: Last EF of 60% in March 2024 no wall motion abnormality Probably some of her epigastric discomfort is due to vascular congestion. GDMT limited by ESRD. Renal was consulted she status post HD on 04/12/2023 and then again on 04/13/2023. Currently on 2 L of oxygen satting greater 93%. She has been weaned to room air.  She has no further orthopnea.  Essential  hypertension: Blood pressure is borderline continue to hold Norvasc, hydralazine and losartan.  Diabetes mellitus type 2 with complications (HCC): Started on sliding scale insulin blood glucose fairly controlled.  Chronic thrombocytopenia: Platelets are pending this morning, continue to monitor intermittently.  End stage renal disease on dialysis [facility]) Renal has been notified.  Usually dialyze Monday Wednesdays and Fridays she has been dialyzed twice on this admission.  Hypokalemia: Per-renal.  Flulike symptoms: She status post vaccine, respiratory panel is negative CT chest showed large pleural effusion with high attenuation.  With asymmetric interstitial pulmonary edema and bilateral effusions right greater than left, has mild cirrhosis.  Cirrhosis: Noted.  LFTs unremarkable, albumin 3.2.    DVT prophylaxis: lovenox Family Communication:none Status is: Inpatient 80Remains inpatient appropriate because: A-fib with RVR and pericardial effusion    Code Status:     Code Status Orders  (From admission, onward)           Start     Ordered   04/11/23 1628  Full code  Continuous       Question:  By:  Answer:  Default: patient does not have capacity for decision making, no surrogate or prior directive available   04/11/23 1628           Code Status History     Date Active Date Inactive Code Status Order ID Comments User Context   01/13/2023 1910 01/17/2023 2356 Full Code [id number]  Simonne Martinet, NP Inpatient   10/20/2022 0847 10/27/2022 2041 Full Code [id number]  Gleason, Markus Jarvis ED   09/16/2022 0422 09/22/2022 0105 Full Code [id number]  Lurline Del, MD ED   12/01/2021 1825 12/28/2021 0014 Full Code [id number]  Marland Mcalpine,  Heloise Beecham, DO Inpatient         IV Access:   Peripheral IV   Procedures and diagnostic studies:   ECHOCARDIOGRAM LIMITED  Result Date: 04/15/2023    ECHOCARDIOGRAM LIMITED REPORT   Patient Name:   Stacey Chambers Date of  Exam: 04/15/2023 Medical Rec #:  [id number]             Height:       61.0 in Accession #:    [id number]            Weight:       136.2 lb Date of Birth:  Mar 02, 1951            BSA:          1.604 m Patient Age:    71 years              BP:           102/53 mmHg Patient Gender: F                     HR:           82 bpm. Exam Location:  Inpatient Procedure: Limited Echo Indications:    I31.3 Pericardial effusion  History:        Patient has prior history of Echocardiogram examinations, most                 recent 04/14/2023. Arrythmias:Atrial Fibrillation; Risk                 Factors:Hypertension, Diabetes and ESRD.  Sonographer:    Irving Burton Senior RDCS Referring Phys: [id number] Eugenio Hoes  Sonographer Comments: Limited to check effusion for possible window, Dr. Leafy Ro at bedside. IMPRESSIONS  1. LIMITED ECHO: Full echo was done on 04/12/2023.  2. Left ventricular ejection fraction, by estimation, is 60 to 65%. The left ventricle has normal function.  3. Right ventricular systolic function grossly normal.  4. Predominantly small. The pericardial effusion is circumferential and stranding noted in the pericardial space. There is no evidence of cardiac tamponade.  5. The mitral valve is degenerative.  6. The aortic valve was not well visualized.  7. The inferior vena cava is normal in size with <50% respiratory variability, suggesting right atrial pressure of 8 mmHg. Comparison(s): A prior study was performed on 04/14/2023. Size of the pericardial effusion has reduced from moderate to small. IVC is now normal in size. Conclusion(s)/Recommendation(s): Repeat limited echo prior to drain removal. FINDINGS  Left Ventricle: Left ventricular ejection fraction, by estimation, is 60 to 65%. The left ventricle has normal function. Right Ventricle: Right ventricular systolic function grossly normal. Pericardium: Predominantly small. The pericardial effusion is circumferential and stranding noted in the pericardial space. There is  no evidence of cardiac tamponade. Mitral Valve: The mitral valve is degenerative in appearance. Mild mitral annular calcification. Tricuspid Valve: The tricuspid valve is grossly normal. Aortic Valve: The aortic valve was not well visualized. Venous: The inferior vena cava is normal in size with less than 50% respiratory variability, suggesting right atrial pressure of 8 mmHg. [name] Electronically signed by Tessa Lerner Signature Date/Time: 04/15/2023/9:34:27 AM    Final    DG CHEST PORT 1 VIEW  Result Date: 04/14/2023 CLINICAL DATA:  Chest pain EXAM: PORTABLE CHEST 1 VIEW COMPARISON:  04/11/2023 FINDINGS: Gross cardiomegaly with implantable loop recorder. Diffuse interstitial opacity. No acute osseous findings. IMPRESSION: Gross cardiomegaly with diffuse interstitial opacity, consistent with edema or atypical/viral infection.

## 2023-04-16 NOTE — Progress Notes (Signed)
[facility] Progress Note  Subjective: seen in room, no c/o's today.  For HD.  Drain to remain in 1 more day, TTE tomorrow and likely out.   Vitals:   04/16/23 0600 04/16/23 0700 04/16/23 0731 04/16/23 0800  BP: (!) 85/36   (!) 114/58  Pulse: 71 79  77  Resp: 13 12  14   Temp:   98.3 F (36.8 C)   TempSrc:   Oral   SpO2: 95% 96%  97%  Weight:      Height:        Exam: Gen alert, no distress, [place] O2 No jvd or bruits Chest clear bilaterally RRR no RG, pericardial drain in place R lower chest Abd soft ntnd no mass or ascites +bs Ext no LE or UE edema Neuro is alert, Ox 3 , nf    LUA AVF+bruit  - ecchymosis laterally, no erythema, fluctuance or drainage     Renal-related home meds: - norvasc 10  - hydralazine 50 bid - renvela 2 ac - losartan 100 every day - dialyvite every day    OP HD: [facility] MWF 3.5h  400/1.5   61.5kg   2/2 bath  AVF   Heparin none - last OP HD 10/9, post wt 65.5kg - comes 1-2kg over usually  - good compliance - mircera 75 q 2, last 10/7, due 10/21  - venofer 100 three times per week thru 10/28 - hectorol 2 ug10/7    CXR 10/09 - vasc congestion, CM   Assessment/ Plan: Pericardial effusion - w/ suspected tamponade by echo. Underwent pericardiocentesis 10/10 w/ 400 cc fluid removal. Drain in place. No window needed per CT surgery.  Plan TTE tomorrow and poss drain out tomorrow.   Afib w/ RVR - getting metoprolol. Anticoagulation on hold due to #1.  ESRD - on HD MWF. Had HD Thursday and Friday here. Next HD today - per dialysis manager ok to come with pericardial drain in place. Marland Kitchen  HTN - BP's low-normal, on metoprolol. Continue.  Volume - breathing better. Had HD thurs and Friday for total UF 5.1 L. At dry wt. UF 1-2 L w/ HD today.  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 Next esa due 10/21. Follow.  MBD ckd - CCa in range, phos a bit high. Cont IV vdra and renvela as binder. AVF infiltration - occurred Friday treatment - good thrill and bruit.  Use  today and if issue will arrange angio.  APAP and ice for symptomatic treatment for now.      Estill Bakes MD [facility] Pager [phone]   Recent Labs  Lab 04/11/23 [phone] 04/11/23 2300 04/12/23 0510 04/13/23 0908 04/14/23 0334 04/15/23 0334 04/16/23 0308  HGB 8.8*   < >  --    < > 9.0* 8.3*  --   ALBUMIN 3.2*  --  3.2*  --   --   --   --   CALCIUM 8.1*   < > 8.9   < > 9.1 8.7* 8.8*  PHOS  --   --  6.3*  --   --   --   --   CREATININE 3.06*   < > 5.11*   < > 2.59* 4.70* 6.72*  K 3.3*   < > 3.7   < > 3.3* 3.7 4.4   < > = values in this interval not displayed.   No results for input(s): "IRON", "TIBC", "FERRITIN" in the last 168 hours. Inpatient medications:  Chlorhexidine Gluconate Cloth  6  each Topical Daily   doxercalciferol  2 mcg Intravenous Q T,Th,Sa-HD   insulin aspart  0-6 Units Subcutaneous TID WC   metoprolol tartrate  25 mg Oral BID   sevelamer carbonate  1,600 mg Oral TID WC    anticoagulant sodium citrate     acetaminophen **OR** acetaminophen, albuterol, alteplase, anticoagulant sodium citrate, diphenhydrAMINE, diphenhydrAMINE, feeding supplement (NEPRO CARB STEADY), guaiFENesin, heparin, HYDROcodone-acetaminophen, lidocaine (PF), lidocaine-prilocaine, melatonin, ondansetron **OR** ondansetron (ZOFRAN) IV, mouth rinse, pentafluoroprop-tetrafluoroeth, sorbitol

## 2023-04-16 NOTE — Progress Notes (Signed)
Pt receives out-pt HD at [facility] [place] on MWF. Will assist as needed.   Olivia Canter Renal Navigator [phone]

## 2023-04-16 NOTE — Progress Notes (Addendum)
3.2*  --   --   --   --   AST 23  --  21  --   --   --   --   ALT 13  --  14  --   --   --   --   ALKPHOS 81  --  86  --   --   --   --   BILITOT 1.0  --  0.9  --   --   --   --   GFRNONAA 16*   < > 9*   < > 19* 10* 6*  ANIONGAP 18*   < > 16*   < > 13 13 13    < > = values in this interval not displayed.    Lipids No results for input(s): "CHOL", "TRIG", "HDL", "LABVLDL", "LDLCALC", "CHOLHDL" in the last 168 hours.  Hematology Recent Labs  Lab 04/13/23 0908 04/14/23 0334 04/15/23 0334  WBC 6.7 7.1 7.4  RBC 2.61* 2.65* 2.43*  HGB 9.2* 9.0* 8.3*  HCT 27.0* 27.2* 25.1*  MCV 103.4* 102.6* 103.3*  MCH 35.2* 34.0 34.2*  MCHC 34.1 33.1 33.1  RDW 14.4 14.9 15.3  PLT 127* 127* 119*   Thyroid No results for input(s): "TSH", "FREET4" in the last 168 hours.  BNPNo results for input(s): "BNP", "PROBNP" in the last 168 hours.  DDimer No results for input(s): "DDIMER" in the last 168 hours.   Radiology    ECHOCARDIOGRAM LIMITED  Result Date: 04/15/2023    ECHOCARDIOGRAM LIMITED REPORT   Patient Name:   Stacey Chambers Date of Exam: 04/15/2023 Medical Rec #:  [id number]             Height:        61.0 in Accession #:    [id number]            Weight:       136.2 lb Date of Birth:  1950-10-06            BSA:          1.604 m Patient Age:    71 years              BP:           102/53 mmHg Patient Gender: F                     HR:           82 bpm. Exam Location:  Inpatient Procedure: Limited Echo Indications:    I31.3 Pericardial effusion  History:        Patient has prior history of Echocardiogram examinations, most                 recent 04/14/2023. Arrythmias:Atrial Fibrillation; Risk                 Factors:Hypertension, Diabetes and ESRD.  Sonographer:    Irving Burton Senior RDCS Referring Phys: [id number] Eugenio Hoes  Sonographer Comments: Limited to check effusion for possible window, Dr. Leafy Ro at bedside. IMPRESSIONS  1. LIMITED ECHO: Full echo was done on 04/12/2023.  2. Left ventricular ejection fraction, by estimation, is 60 to 65%. The left ventricle has normal function.  3. Right ventricular systolic function grossly normal.  4. Predominantly small. The pericardial effusion is circumferential and stranding noted in the pericardial space. There is no evidence of cardiac tamponade.  5. The mitral valve is degenerative.  6. The aortic valve was not well visualized.  3.2*  --   --   --   --   AST 23  --  21  --   --   --   --   ALT 13  --  14  --   --   --   --   ALKPHOS 81  --  86  --   --   --   --   BILITOT 1.0  --  0.9  --   --   --   --   GFRNONAA 16*   < > 9*   < > 19* 10* 6*  ANIONGAP 18*   < > 16*   < > 13 13 13    < > = values in this interval not displayed.    Lipids No results for input(s): "CHOL", "TRIG", "HDL", "LABVLDL", "LDLCALC", "CHOLHDL" in the last 168 hours.  Hematology Recent Labs  Lab 04/13/23 0908 04/14/23 0334 04/15/23 0334  WBC 6.7 7.1 7.4  RBC 2.61* 2.65* 2.43*  HGB 9.2* 9.0* 8.3*  HCT 27.0* 27.2* 25.1*  MCV 103.4* 102.6* 103.3*  MCH 35.2* 34.0 34.2*  MCHC 34.1 33.1 33.1  RDW 14.4 14.9 15.3  PLT 127* 127* 119*   Thyroid No results for input(s): "TSH", "FREET4" in the last 168 hours.  BNPNo results for input(s): "BNP", "PROBNP" in the last 168 hours.  DDimer No results for input(s): "DDIMER" in the last 168 hours.   Radiology    ECHOCARDIOGRAM LIMITED  Result Date: 04/15/2023    ECHOCARDIOGRAM LIMITED REPORT   Patient Name:   Stacey Chambers Date of Exam: 04/15/2023 Medical Rec #:  [id number]             Height:        61.0 in Accession #:    [id number]            Weight:       136.2 lb Date of Birth:  1950-10-06            BSA:          1.604 m Patient Age:    71 years              BP:           102/53 mmHg Patient Gender: F                     HR:           82 bpm. Exam Location:  Inpatient Procedure: Limited Echo Indications:    I31.3 Pericardial effusion  History:        Patient has prior history of Echocardiogram examinations, most                 recent 04/14/2023. Arrythmias:Atrial Fibrillation; Risk                 Factors:Hypertension, Diabetes and ESRD.  Sonographer:    Irving Burton Senior RDCS Referring Phys: [id number] Eugenio Hoes  Sonographer Comments: Limited to check effusion for possible window, Dr. Leafy Ro at bedside. IMPRESSIONS  1. LIMITED ECHO: Full echo was done on 04/12/2023.  2. Left ventricular ejection fraction, by estimation, is 60 to 65%. The left ventricle has normal function.  3. Right ventricular systolic function grossly normal.  4. Predominantly small. The pericardial effusion is circumferential and stranding noted in the pericardial space. There is no evidence of cardiac tamponade.  5. The mitral valve is degenerative.  6. The aortic valve was not well visualized.  7. The inferior vena cava is normal in size with <50% respiratory variability, suggesting right atrial pressure of 8 mmHg. Comparison(s): A prior study was performed on 04/14/2023. Size of the pericardial effusion has reduced from moderate to small. IVC is now normal in size. Conclusion(s)/Recommendation(s): Repeat limited echo prior to drain removal. FINDINGS  Left Ventricle: Left ventricular ejection fraction, by estimation, is 60 to 65%. The left ventricle has normal function. Right Ventricle: Right ventricular systolic function grossly normal. Pericardium: Predominantly small. The pericardial effusion is circumferential and stranding noted in the pericardial space. There is no evidence of cardiac tamponade. Mitral Valve: The mitral valve is  degenerative in appearance. Mild mitral annular calcification. Tricuspid Valve: The tricuspid valve is grossly normal. Aortic Valve: The aortic valve was not well visualized. Venous: The inferior vena cava is normal in size with less than 50% respiratory variability, suggesting right atrial pressure of 8 mmHg. [name] Electronically signed by Tessa Lerner Signature Date/Time: 04/15/2023/9:34:27 AM    Final    DG CHEST PORT 1 VIEW  Result Date: 04/14/2023 CLINICAL DATA:  Chest pain EXAM: PORTABLE CHEST 1 VIEW COMPARISON:  04/11/2023 FINDINGS: Gross cardiomegaly with implantable loop recorder. Diffuse interstitial opacity. No acute osseous findings. IMPRESSION: Gross cardiomegaly with diffuse interstitial opacity, consistent with edema or atypical/viral infection. No focal airspace opacity. Electronically Signed   By: Jearld Lesch M.D.   On: 04/14/2023 14:33   ECHOCARDIOGRAM LIMITED  Result Date: 04/14/2023    ECHOCARDIOGRAM LIMITED REPORT   Patient Name:   Stacey Chambers Date of Exam: 04/14/2023 Medical Rec #:  [id number]             Height:       61.0 in Accession #:    [id number]            Weight:       138.2 lb Date of Birth:  June 01, 1951            BSA:          1.614 m Patient Age:    71 years              BP:           122/59 mmHg Patient Gender: F                     HR:           88 bpm. Exam Location:  Inpatient Procedure: Limited Echo, Color Doppler and Cardiac Doppler Indications:    Pericaridal Effusion  History:        Patient has prior history of Echocardiogram examinations, most                 recent 04/13/2023. Risk Factors:Diabetes and Hypertension.  Sonographer:    Raeford Razor RDCS Referring Phys: [phone] LINDSAY B ROBERTS IMPRESSIONS  1. Left ventricular ejection fraction, by estimation, is 60 to 65%. The left ventricle has normal function.  2. Right ventricular systolic function is normal. The right ventricular size is normal.  3. Moderate pericardial effusion. Large pleural  effusion. Comparison(s): No significant change from prior study. Conclusion(s)/Recommendation(s): Does have some RV collapse in early diastole. Inflows are borderline. The IVC is collapsing. Not consistent with cardiac tamponade. FINDINGS  Left Ventricle: Left ventricular ejection fraction, by estimation, is 60 to 65%. The left ventricle has normal function. Right Ventricle: The right ventricular size is normal. Right ventricular systolic function is normal. Pericardium: A moderately sized pericardial effusion is present.  3.2*  --   --   --   --   AST 23  --  21  --   --   --   --   ALT 13  --  14  --   --   --   --   ALKPHOS 81  --  86  --   --   --   --   BILITOT 1.0  --  0.9  --   --   --   --   GFRNONAA 16*   < > 9*   < > 19* 10* 6*  ANIONGAP 18*   < > 16*   < > 13 13 13    < > = values in this interval not displayed.    Lipids No results for input(s): "CHOL", "TRIG", "HDL", "LABVLDL", "LDLCALC", "CHOLHDL" in the last 168 hours.  Hematology Recent Labs  Lab 04/13/23 0908 04/14/23 0334 04/15/23 0334  WBC 6.7 7.1 7.4  RBC 2.61* 2.65* 2.43*  HGB 9.2* 9.0* 8.3*  HCT 27.0* 27.2* 25.1*  MCV 103.4* 102.6* 103.3*  MCH 35.2* 34.0 34.2*  MCHC 34.1 33.1 33.1  RDW 14.4 14.9 15.3  PLT 127* 127* 119*   Thyroid No results for input(s): "TSH", "FREET4" in the last 168 hours.  BNPNo results for input(s): "BNP", "PROBNP" in the last 168 hours.  DDimer No results for input(s): "DDIMER" in the last 168 hours.   Radiology    ECHOCARDIOGRAM LIMITED  Result Date: 04/15/2023    ECHOCARDIOGRAM LIMITED REPORT   Patient Name:   Stacey Chambers Date of Exam: 04/15/2023 Medical Rec #:  [id number]             Height:        61.0 in Accession #:    [id number]            Weight:       136.2 lb Date of Birth:  1950-10-06            BSA:          1.604 m Patient Age:    71 years              BP:           102/53 mmHg Patient Gender: F                     HR:           82 bpm. Exam Location:  Inpatient Procedure: Limited Echo Indications:    I31.3 Pericardial effusion  History:        Patient has prior history of Echocardiogram examinations, most                 recent 04/14/2023. Arrythmias:Atrial Fibrillation; Risk                 Factors:Hypertension, Diabetes and ESRD.  Sonographer:    Irving Burton Senior RDCS Referring Phys: [id number] Eugenio Hoes  Sonographer Comments: Limited to check effusion for possible window, Dr. Leafy Ro at bedside. IMPRESSIONS  1. LIMITED ECHO: Full echo was done on 04/12/2023.  2. Left ventricular ejection fraction, by estimation, is 60 to 65%. The left ventricle has normal function.  3. Right ventricular systolic function grossly normal.  4. Predominantly small. The pericardial effusion is circumferential and stranding noted in the pericardial space. There is no evidence of cardiac tamponade.  5. The mitral valve is degenerative.  6. The aortic valve was not well visualized.  7. The inferior vena cava is normal in size with <50% respiratory variability, suggesting right atrial pressure of 8 mmHg. Comparison(s): A prior study was performed on 04/14/2023. Size of the pericardial effusion has reduced from moderate to small. IVC is now normal in size. Conclusion(s)/Recommendation(s): Repeat limited echo prior to drain removal. FINDINGS  Left Ventricle: Left ventricular ejection fraction, by estimation, is 60 to 65%. The left ventricle has normal function. Right Ventricle: Right ventricular systolic function grossly normal. Pericardium: Predominantly small. The pericardial effusion is circumferential and stranding noted in the pericardial space. There is no evidence of cardiac tamponade. Mitral Valve: The mitral valve is  degenerative in appearance. Mild mitral annular calcification. Tricuspid Valve: The tricuspid valve is grossly normal. Aortic Valve: The aortic valve was not well visualized. Venous: The inferior vena cava is normal in size with less than 50% respiratory variability, suggesting right atrial pressure of 8 mmHg. [name] Electronically signed by Tessa Lerner Signature Date/Time: 04/15/2023/9:34:27 AM    Final    DG CHEST PORT 1 VIEW  Result Date: 04/14/2023 CLINICAL DATA:  Chest pain EXAM: PORTABLE CHEST 1 VIEW COMPARISON:  04/11/2023 FINDINGS: Gross cardiomegaly with implantable loop recorder. Diffuse interstitial opacity. No acute osseous findings. IMPRESSION: Gross cardiomegaly with diffuse interstitial opacity, consistent with edema or atypical/viral infection. No focal airspace opacity. Electronically Signed   By: Jearld Lesch M.D.   On: 04/14/2023 14:33   ECHOCARDIOGRAM LIMITED  Result Date: 04/14/2023    ECHOCARDIOGRAM LIMITED REPORT   Patient Name:   Stacey Chambers Date of Exam: 04/14/2023 Medical Rec #:  [id number]             Height:       61.0 in Accession #:    [id number]            Weight:       138.2 lb Date of Birth:  June 01, 1951            BSA:          1.614 m Patient Age:    71 years              BP:           122/59 mmHg Patient Gender: F                     HR:           88 bpm. Exam Location:  Inpatient Procedure: Limited Echo, Color Doppler and Cardiac Doppler Indications:    Pericaridal Effusion  History:        Patient has prior history of Echocardiogram examinations, most                 recent 04/13/2023. Risk Factors:Diabetes and Hypertension.  Sonographer:    Raeford Razor RDCS Referring Phys: [phone] LINDSAY B ROBERTS IMPRESSIONS  1. Left ventricular ejection fraction, by estimation, is 60 to 65%. The left ventricle has normal function.  2. Right ventricular systolic function is normal. The right ventricular size is normal.  3. Moderate pericardial effusion. Large pleural  effusion. Comparison(s): No significant change from prior study. Conclusion(s)/Recommendation(s): Does have some RV collapse in early diastole. Inflows are borderline. The IVC is collapsing. Not consistent with cardiac tamponade. FINDINGS  Left Ventricle: Left ventricular ejection fraction, by estimation, is 60 to 65%. The left ventricle has normal function. Right Ventricle: The right ventricular size is normal. Right ventricular systolic function is normal. Pericardium: A moderately sized pericardial effusion is present.

## 2023-04-17 ENCOUNTER — Inpatient Hospital Stay (HOSPITAL_COMMUNITY): Payer: 59

## 2023-04-17 DIAGNOSIS — Z9889 Other specified postprocedural states: Secondary | ICD-10-CM | POA: Diagnosis not present

## 2023-04-17 DIAGNOSIS — I4891 Unspecified atrial fibrillation: Secondary | ICD-10-CM | POA: Diagnosis not present

## 2023-04-17 DIAGNOSIS — I3139 Other pericardial effusion (noninflammatory): Secondary | ICD-10-CM | POA: Diagnosis not present

## 2023-04-17 DIAGNOSIS — N186 End stage renal disease: Secondary | ICD-10-CM | POA: Diagnosis not present

## 2023-04-17 LAB — BASIC METABOLIC PANEL
Anion gap: 13 (ref 5–15)
BUN: 16 mg/dL (ref 8–23)
CO2: 27 mmol/L (ref 22–32)
Calcium: 9 mg/dL (ref 8.9–10.3)
Chloride: 94 mmol/L — ABNORMAL LOW (ref 98–111)
Creatinine, Ser: 4.2 mg/dL — ABNORMAL HIGH (ref 0.44–1.00)
GFR, Estimated: 11 mL/min — ABNORMAL LOW (ref >60)
Glucose, Bld: 96 mg/dL (ref 70–99)
Potassium: 4.1 mmol/L (ref 3.5–5.1)
Sodium: 134 mmol/L — ABNORMAL LOW (ref 135–145)

## 2023-04-17 LAB — ECHOCARDIOGRAM LIMITED
Height: 61 in
S' Lateral: 2.7 cm
Weight: 2116.42 [oz_av]

## 2023-04-17 LAB — GLUCOSE, CAPILLARY
Glucose-Capillary: 92 mg/dL (ref 70–99)
Glucose-Capillary: 97 mg/dL (ref 70–99)
Glucose-Capillary: 115 mg/dL — ABNORMAL HIGH (ref 70–99)
Glucose-Capillary: 104 mg/dL — ABNORMAL HIGH (ref 70–99)

## 2023-04-17 LAB — MAGNESIUM: Magnesium: 2.3 mg/dL (ref 1.7–2.4)

## 2023-04-17 MED ORDER — SENNOSIDES-DOCUSATE SODIUM 8.6-50 MG PO TABS
1.0000 | ORAL_TABLET | Freq: Every day | ORAL | Status: DC
  Filled 2023-04-17: qty 1

## 2023-04-17 MED ORDER — POLYETHYLENE GLYCOL 3350 17 G PO PACK: 17.0000 g | PACK | Freq: Every day | ORAL | Status: DC | PRN

## 2023-04-17 MED ORDER — POLYETHYLENE GLYCOL 3350 17 G PO PACK: 17.0000 g | PACK | Freq: Every day | ORAL | Status: DC

## 2023-04-17 NOTE — Progress Notes (Signed)
  Echocardiogram 2D Echocardiogram has been performed.  Stacey Chambers 04/17/2023, 9:55 AM

## 2023-04-17 NOTE — Progress Notes (Signed)
Physical Therapy Treatment Patient Details Name: Stacey Chambers MRN: [id number] DOB: 04/16/1951 Today's Date: 04/17/2023   History of Present Illness Pt is 72 year old presented to [facility] on  04/11/23 for new onset afib and pericardial effusion. Underwent pericardicentesis on 10/10.  Noted decreased output and echo positive for mod effusion w/dilated IVC, had drain readjustment and output improved with IVC size normal. PMH - pacer, esrd on HD, HTN, DM, alcoholic cirrhosis.    PT Comments  Pt progressing well towards all goals. Pt with improved ambulation tolerance this date however c/o of bilat LE pain. Pt reports living alone however in initial eval pt's cousin was present and reports they can provided intermittent assist. PT to continue to follow acutely. Pt to potentially benefit from HHPT services upon d/c for safety and energy conservation as pt does live alone.     If plan is discharge home, recommend the following: A little help with walking and/or transfers;A little help with bathing/dressing/bathroom;Help with stairs or ramp for entrance;Assist for transportation   Can travel by private vehicle        Equipment Recommendations  Rolling walker (2 wheels) (youth RW)    Recommendations for Other Services       Precautions / Restrictions Precautions Precautions: Fall Precaution Comments: pericardial drain- hemovac Restrictions Weight Bearing Restrictions: No     Mobility  Bed Mobility Overal bed mobility: Needs Assistance Bed Mobility: Sidelying to Sit   Sidelying to sit: Mod assist       General bed mobility comments: pt received in R sidelying, modA for trunk elevation to complete sitting EOB, pt c/o back pain    Transfers Overall transfer level: Needs assistance Equipment used: Rolling walker (2 wheels) Transfers: Sit to/from Stand Sit to Stand: Contact guard assist           General transfer comment: for lines/safety, verbal cues to push up from bed     Ambulation/Gait Ambulation/Gait assistance: Contact guard assist, Supervision Gait Distance (Feet): 370 Feet Assistive device: Rolling walker (2 wheels) Gait Pattern/deviations: Step-through pattern, Decreased stride length Gait velocity: wfl     General Gait Details: pt with good walker management, pt with c/o bilat LE pain and progressive increased dependence on bilat UEs   Stairs             Wheelchair Mobility     Tilt Bed    Modified Rankin (Stroke Patients Only)       Balance Overall balance assessment: Needs assistance   Sitting balance-Leahy Scale: Good     Standing balance support: No upper extremity supported Standing balance-Leahy Scale: Fair                              Cognition Arousal: Alert Behavior During Therapy: WFL for tasks assessed/performed Overall Cognitive Status: Within Functional Limits for tasks assessed                                 General Comments: can be quick to move with noted decreased insight to lines/drains, however suspect this to be baseline        Exercises      General Comments General comments (skin integrity, edema, etc.): VSS on RA      Pertinent Vitals/Pain Pain Assessment Pain Assessment: Faces Faces Pain Scale: Hurts little more Pain Location: bilat knees and legs Pain Descriptors / Indicators: Aching  Home Living                          Prior Function            PT Goals (current goals can now be found in the care plan section) Acute Rehab PT Goals Patient Stated Goal: return to independent PT Goal Formulation: With patient Time For Goal Achievement: 04/29/23 Potential to Achieve Goals: Good Progress towards PT goals: Progressing toward goals    Frequency    Min 1X/week      PT Plan      Co-evaluation              AM-PAC PT "6 Clicks" Mobility   Outcome Measure  Help needed turning from your back to your side while in a flat  bed without using bedrails?: A Little Help needed moving from lying on your back to sitting on the side of a flat bed without using bedrails?: A Little Help needed moving to and from a bed to a chair (including a wheelchair)?: A Little Help needed standing up from a chair using your arms (e.g., wheelchair or bedside chair)?: A Little Help needed to walk in hospital room?: A Little Help needed climbing 3-5 steps with a railing? : Total 6 Click Score: 16    End of Session Equipment Utilized During Treatment: Gait belt Activity Tolerance: Patient tolerated treatment well Patient left: with call bell/phone within reach;in chair   PT Visit Diagnosis: Muscle weakness (generalized) (M62.81)     Time: 1478-2956 PT Time Calculation (min) (ACUTE ONLY): 20 min  Charges:    $Gait Training: 8-22 mins PT General Charges $$ ACUTE PT VISIT: 1 Visit                     Lewis Shock, PT, DPT Acute Rehabilitation Services Secure chat preferred Office #: [phone]    Iona Hansen 04/17/2023, 11:51 AM

## 2023-04-17 NOTE — Progress Notes (Signed)
Rounding Note    Patient Name: Stacey Chambers Date of Encounter: 04/17/2023  [facility] Cardiologist: Maisie Fus, MD   Subjective   No chest pain; pericardial drain still draining bloody pericardial fluid  Inpatient Medications    Scheduled Meds:  Chlorhexidine Gluconate Cloth  6 each Topical Daily   doxercalciferol  2 mcg Intravenous Q T,Th,Sa-HD   insulin aspart  0-6 Units Subcutaneous TID WC   metoprolol tartrate  25 mg Oral BID   polyethylene glycol  17 g Oral Daily   senna-docusate  1 tablet Oral QHS   sevelamer carbonate  1,600 mg Oral TID WC   Continuous Infusions:   PRN Meds: acetaminophen **OR** acetaminophen, albuterol, diphenhydrAMINE, guaiFENesin, HYDROcodone-acetaminophen, melatonin, ondansetron **OR** ondansetron (ZOFRAN) IV, mouth rinse, sorbitol   Vital Signs    Vitals:   04/17/23 0400 04/17/23 0420 04/17/23 0600 04/17/23 0650  BP: (!) 86/46 (!) 107/48 (!) 99/49   Pulse: 64 76 73   Resp: (!) 29 16 15    Temp:    97.8 F (36.6 C)  TempSrc:    Oral  SpO2: 97% 98% 97%   Weight:   60 kg   Height:   5\' 1"  (1.549 m)     Intake/Output Summary (Last 24 hours) at 04/17/2023 0819 Last data filed at 04/17/2023 0244 Gross per 24 hour  Intake 280 ml  Output 1025 ml  Net -745 ml   I/O since admission: -4711      04/17/2023    6:00 AM 04/16/2023    5:00 AM 04/15/2023    4:00 AM  Last 3 Weights  Weight (lbs) 132 lb 4.4 oz 134 lb 0.6 oz 136 lb 3.9 oz  Weight (kg) 60 kg 60.8 kg 61.8 kg      Telemetry    Sinus at 72 - Personally Reviewed  ECG    04/12/2023 ECG - Sinus at 68, RBBB, Personally Reviewed  Physical Exam   BP (!) 99/49   Pulse 73   Temp 97.8 F (36.6 C) (Oral)   Resp 15   Ht 5\' 1"  (1.549 m)   Wt 60 kg   SpO2 97%   BMI 24.99 kg/m  General: Alert, oriented, no distress.  Skin: normal turgor, no rashes, warm and dry; new bandage over pericardial drain site HEENT: Normocephalic, atraumatic. Pupils  equal round and reactive to light; sclera anicteric; extraocular muscles intact;  Nose without nasal septal hypertrophy Mouth/Parynx benign; Mallinpatti scale 3 Neck: No JVD, no carotid bruits; normal carotid upstroke Lungs: clear to ausculatation and percussion; no wheezing or rales Chest wall: without tenderness to palpitation Heart: PMI not displaced, RRR, s1 s2 normal, 1/6 systolic murmur, no diastolic murmur, no rubs, gallops, thrills, or heaves Abdomen: soft, nontender; no hepatosplenomehaly, BS+; abdominal aorta nontender and not dilated by palpation. Back: no CVA tenderness Pulses 2+ Musculoskeletal: full range of motion, normal strength, no joint deformities Extremities: ecchymosis at AV fistula site L upper arm Neurologic: grossly nonfocal; Cranial nerves grossly wnl Psychologic: Normal mood and affect    Labs    High Sensitivity Troponin:   Recent Labs  Lab 04/11/23 0951 04/11/23 1200  TROPONINIHS 9 11     Chemistry Recent Labs  Lab 04/11/23 0951 04/11/23 2300 04/12/23 0510 04/13/23 0907 04/15/23 0334 04/16/23 0308 04/17/23 0248  NA 134*   < > 134*   < > 134* 133* 134*  K 3.3*   < > 3.7   < > 3.7 4.4 4.1  CL 90*   < >  92*   < > 95* 93* 94*  CO2 26   < > 26   < > 26 27 27   GLUCOSE 108*   < > 117*   < > 107* 108* 96  BUN 15   < > 25*   < > 21 33* 16  CREATININE 3.06*   < > 5.11*   < > 4.70* 6.72* 4.20*  CALCIUM 8.1*   < > 8.9   < > 8.7* 8.8* 9.0  MG  --   --   --    < > 2.4 2.6* 2.3  PROT 7.7  --  7.5  --   --   --   --   ALBUMIN 3.2*  --  3.2*  --   --   --   --   AST 23  --  21  --   --   --   --   ALT 13  --  14  --   --   --   --   ALKPHOS 81  --  86  --   --   --   --   BILITOT 1.0  --  0.9  --   --   --   --   GFRNONAA 16*   < > 9*   < > 10* 6* 11*  ANIONGAP 18*   < > 16*   < > 13 13 13    < > = values in this interval not displayed.    Lipids No results for input(s): "CHOL", "TRIG", "HDL", "LABVLDL", "LDLCALC", "CHOLHDL" in the last 168 hours.   Hematology Recent Labs  Lab 04/14/23 0334 04/15/23 0334 04/16/23 1002  WBC 7.1 7.4 5.9  RBC 2.65* 2.43* 2.52*  HGB 9.0* 8.3* 8.8*  HCT 27.2* 25.1* 26.6*  MCV 102.6* 103.3* 105.6*  MCH 34.0 34.2* 34.9*  MCHC 33.1 33.1 33.1  RDW 14.9 15.3 15.7*  PLT 127* 119* 146*   Thyroid No results for input(s): "TSH", "FREET4" in the last 168 hours.  BNPNo results for input(s): "BNP", "PROBNP" in the last 168 hours.  DDimer No results for input(s): "DDIMER" in the last 168 hours.   Radiology    No results found.  Cardiac Studies   Pericardiocentesis: 04/12/2023 1.  Successful pericardial drain placement with decrease in pericardial pressure from 28 mmHg to 11 mmHg after evacuation of 400 cc of dark bloody fluid.  The drain was secured in place and the patient was transported 2H unit for further evaluation and treatment.  ECHO: 04/12/2023  1. Left ventricular ejection fraction, by estimation, is 60 to 65%. The  left ventricle has normal function. The left ventricle has no regional  wall motion abnormalities. Left ventricular diastolic parameters are  consistent with Grade I diastolic  dysfunction (impaired relaxation).   2. Right ventricular systolic function is normal. The right ventricular  size is normal. There is normal pulmonary artery systolic pressure.   3. Pericardial effusion measures up to 3.2 cm. Comapred with the echo  09/2022, pericardial effusion is much larger. Echo findings of diastolic RA  and RV collapse as well as >25% respiratory inflow varation are suggestive  of cardiac tamponade. The IVC is  not dilated, but doesn't collapse, indicating RA pressure 8 mmHg. Suggest  clinical correlation as patient has several features suggesting that she  is at risk for tamponade. Large pericardial effusion. The pericardial  effusion is circumferential.   4. The mitral valve is normal in structure. Mild mitral valve  regurgitation. No evidence of mitral stenosis.   5. The aortic  valve was not well visualized. There is mild calcification  of the aortic valve. There is mild thickening of the aortic valve. Aortic  valve regurgitation is not visualized. No aortic stenosis is present.   6. The inferior vena cava is normal in size with <50% respiratory  variability, suggesting right atrial pressure of 8 mmHg.    1. LIMITED ECHO: 04/13/2023 - See report 04/12/2023 for details.   2. Left ventricular ejection fraction, by estimation, is 60 to 65%. The  left ventricle has normal function.   3. Moderate pericardial effusion. The pericardial effusion is  circumferential. There is no evidence of cardiac tamponade.   4. The inferior vena cava is normal in size with <50% respiratory  variability, suggesting right atrial pressure of 8 mmHg.   Comparison(s): A prior study was performed on 04/12/23. Reported large  pericardial effusion with hemodynamic features suggestive of tamponade.     LIMITED ECHO: 10/122024  1. Left ventricular ejection fraction, by estimation, is 60 to 65%. The  left ventricle has normal function.   2. Right ventricular systolic function is normal. The right ventricular  size is normal.   3. Moderate pericardial effusion. Large pleural effusion.   Comparison(s): No significant change from prior study.   Patient Profile     72 y.o. female  with a hx of ESRD on hemodialysis, alcohol cirrhosis, hypertension, DM, anemia, complete heart block s/p leadless PPM who is being seen 04/11/2023 for the evaluation of paroxysmal afib at the request of Dr. Posey Rea and Evlyn Kanner PA-C.   Assessment & Plan    Large pericardial effusion effusion with initial tamponade, patient underwent thoracentesis on April 12, 2023 with removal of 400 cc of dark bloody fluid with improvement in pericardial pressure from 28 to 11 mmHg.  Patient has had continued drainage. Subsequent limited echo continued to show residual moderate effusion. Drain was manipulated on 10/12 with  improved output.  Total pericardial fluid removed 04/15/23 450 cc and patient had an additional 70 cc removal overnight.  10/14 drainage continued but decreased to 30cc.  Plan echo and probably dc drain today post echo if no significant residual effusion. PAF: On presentation.  No further AF; maintaining sinus rhythm in the 70s.  History of complete heart block, status post leadless permanent pacemaker.  Currently she is on metoprolol tartrate 25 mg twice a day plans for consolidation to succinate prior to discharge. End-stage renal disease.  Dialysis yesterday 10/14; total UF removed 800 ml. Chronic anemia.  Most recent hemoglobin 9.0, hematocrit 27.2.  MCV is increased at 102.  B12 1270  Folate 8.2.   For questions or updates, please contact [facility] Please consult [email or web address] for contact info under        Signed, Nicki Gu[place], MD  04/17/2023, 8:19 AM

## 2023-04-17 NOTE — Progress Notes (Signed)
Procedures and diagnostic studies:   ECHOCARDIOGRAM LIMITED  Result Date: 04/15/2023    ECHOCARDIOGRAM LIMITED REPORT   Patient Name:   Stacey Chambers Date of Exam: 04/15/2023 Medical Rec #:  [id number]             Height:       61.0 in Accession  #:    [id number]            Weight:       136.2 lb Date of Birth:  1950/12/08            BSA:          1.604 m Patient Age:    71 years              BP:           102/53 mmHg Patient Gender: F                     HR:           82 bpm. Exam Location:  Inpatient Procedure: Limited Echo Indications:    I31.3 Pericardial effusion  History:        Patient has prior history of Echocardiogram examinations, most                 recent 04/14/2023. Arrythmias:Atrial Fibrillation; Risk                 Factors:Hypertension, Diabetes and ESRD.  Sonographer:    Irving Burton Senior RDCS Referring Phys: [id number] Eugenio Hoes  Sonographer Comments: Limited to check effusion for possible window, Dr. Leafy Ro at bedside. IMPRESSIONS  1. LIMITED ECHO: Full echo was done on 04/12/2023.  2. Left ventricular ejection fraction, by estimation, is 60 to 65%. The left ventricle has normal function.  3. Right ventricular systolic function grossly normal.  4. Predominantly small. The pericardial effusion is circumferential and stranding noted in the pericardial space. There is no evidence of cardiac tamponade.  5. The mitral valve is degenerative.  6. The aortic valve was not well visualized.  7. The inferior vena cava is normal in size with <50% respiratory variability, suggesting right atrial pressure of 8 mmHg. Comparison(s): A prior study was performed on 04/14/2023. Size of the pericardial effusion has reduced from moderate to small. IVC is now normal in size. Conclusion(s)/Recommendation(s): Repeat limited echo prior to drain removal. FINDINGS  Left Ventricle: Left ventricular ejection fraction, by estimation, is 60 to 65%. The left ventricle has normal function. Right Ventricle: Right ventricular systolic function grossly normal. Pericardium: Predominantly small. The pericardial effusion is circumferential and stranding noted in the pericardial space. There is no evidence of cardiac tamponade. Mitral Valve: The mitral valve is degenerative in  appearance. Mild mitral annular calcification. Tricuspid Valve: The tricuspid valve is grossly normal. Aortic Valve: The aortic valve was not well visualized. Venous: The inferior vena cava is normal in size with less than 50% respiratory variability, suggesting right atrial pressure of 8 mmHg. [name] Electronically signed by Tessa Lerner Signature Date/Time: 04/15/2023/9:34:27 AM    Final      Medical Consultants:   None.   Subjective:    Stacey Chambers complaints today.  Objective:    Vitals:   04/17/23 0400 04/17/23 0420 04/17/23 0600 04/17/23 0650  BP: (!) 86/46 (!) 107/48 (!) 99/49   Pulse: 64 76 73   Resp: (!) 29 16 15    Temp:    97.8 F (36.6 C)  TempSrc:  [facility] PROGRESS NOTE    Progress Note  Stacey Chambers  GMW:[id number] DOB: December 12, 1950 DOA: 04/11/2023 PCP: Corliss Blacker, MD     Brief Narrative:   Stacey Chambers is an 72 y.o. female past medical history significant for end-stage renal disease on hemodialysis Tuesday Thursday and Saturdays, cirrhosis ,status post pacemaker due to third-degree block, essential hypertension presents to the ED as she was having an increasing heart rate of 130s with dialysis.  Accompanied with shortness of breath and chest pain, sore throat poor appetite and abdominal pain.  Started on IV diltiazem then discontinued and changed to oral metoprolol. 2D echo showed an EF of 50% grade 1 diastolic dysfunction but a significantly larger effusion compared to previous.   Assessment/Plan:   New onset atrial fibrillation in a patient with a history of complete heart block with leadless pacemaker: Continue metoprolol and only heparin for DVT prophylaxis, anticoagulation per cardiology. Cardiology is on board was concerned that her A-fib might be related to her progression of her pericardial effusion. She remains in sinus rhythm.  Large Pericardial effusion: Status post pericardiocentesis on 04/12/2023. CT surgery drain was manipulated on 04/15/2023 large amount of fluid came out repeated echo showed small effusion. CT surgery They have signed off. Drain output has significantly decreased Limited 2D echo to evaluate effusion today. Continue to hold anticoagulation. Currently on a diet. Further management per cardiology.  Chronic diastolic heart failure: Last EF of 60% in March 2024 no wall motion abnormality GDMT limited by ESRD. Renal was consulted she status post HD on 04/12/2023 and then again on 04/13/2023. She has been wean to RA. She has no further orthopnea.  Essential hypertension: Blood pressure is borderline continue to hold Norvasc, hydralazine and  losartan.  Diabetes mellitus type 2 with complications (HCC): Started on sliding scale insulin blood glucose fairly controlled.  Chronic thrombocytopenia: Platelets are pending this morning, continue to monitor intermittently.  End stage renal disease on dialysis [facility]) Renal has been notified.  Usually dialyze Monday Wednesdays and Fridays she has been dialyzed twice on this admission.  Hypokalemia: Per-renal.  Flulike symptoms: She status post vaccine, respiratory panel is negative CT chest showed large pleural effusion with high attenuation.  With asymmetric interstitial pulmonary edema and bilateral effusions right greater than left, has mild cirrhosis.  Cirrhosis: Noted.  LFTs unremarkable, albumin 3.2.    DVT prophylaxis: lovenox Family Communication:none Status is: Inpatient 80Remains inpatient appropriate because: A-fib with RVR and pericardial effusion    Code Status:     Code Status Orders  (From admission, onward)           Start     Ordered   04/11/23 1628  Full code  Continuous       Question:  By:  Answer:  Default: patient does not have capacity for decision making, no surrogate or prior directive available   04/11/23 1628           Code Status History     Date Active Date Inactive Code Status Order ID Comments User Context   01/13/2023 1910 01/17/2023 2356 Full Code [id number]  Simonne Martinet, NP Inpatient   10/20/2022 0847 10/27/2022 2041 Full Code [id number]  Gleason, Markus Jarvis ED   09/16/2022 0422 09/22/2022 0105 Full Code [id number]  Lurline Del, MD ED   12/01/2021 1825 12/28/2021 0014 Full Code [id number]  Merlene Laughter, DO Inpatient         IV Access:   Peripheral IV  [facility] PROGRESS NOTE    Progress Note  Stacey Chambers  GMW:[id number] DOB: December 12, 1950 DOA: 04/11/2023 PCP: Corliss Blacker, MD     Brief Narrative:   Stacey Chambers is an 72 y.o. female past medical history significant for end-stage renal disease on hemodialysis Tuesday Thursday and Saturdays, cirrhosis ,status post pacemaker due to third-degree block, essential hypertension presents to the ED as she was having an increasing heart rate of 130s with dialysis.  Accompanied with shortness of breath and chest pain, sore throat poor appetite and abdominal pain.  Started on IV diltiazem then discontinued and changed to oral metoprolol. 2D echo showed an EF of 50% grade 1 diastolic dysfunction but a significantly larger effusion compared to previous.   Assessment/Plan:   New onset atrial fibrillation in a patient with a history of complete heart block with leadless pacemaker: Continue metoprolol and only heparin for DVT prophylaxis, anticoagulation per cardiology. Cardiology is on board was concerned that her A-fib might be related to her progression of her pericardial effusion. She remains in sinus rhythm.  Large Pericardial effusion: Status post pericardiocentesis on 04/12/2023. CT surgery drain was manipulated on 04/15/2023 large amount of fluid came out repeated echo showed small effusion. CT surgery They have signed off. Drain output has significantly decreased Limited 2D echo to evaluate effusion today. Continue to hold anticoagulation. Currently on a diet. Further management per cardiology.  Chronic diastolic heart failure: Last EF of 60% in March 2024 no wall motion abnormality GDMT limited by ESRD. Renal was consulted she status post HD on 04/12/2023 and then again on 04/13/2023. She has been wean to RA. She has no further orthopnea.  Essential hypertension: Blood pressure is borderline continue to hold Norvasc, hydralazine and  losartan.  Diabetes mellitus type 2 with complications (HCC): Started on sliding scale insulin blood glucose fairly controlled.  Chronic thrombocytopenia: Platelets are pending this morning, continue to monitor intermittently.  End stage renal disease on dialysis [facility]) Renal has been notified.  Usually dialyze Monday Wednesdays and Fridays she has been dialyzed twice on this admission.  Hypokalemia: Per-renal.  Flulike symptoms: She status post vaccine, respiratory panel is negative CT chest showed large pleural effusion with high attenuation.  With asymmetric interstitial pulmonary edema and bilateral effusions right greater than left, has mild cirrhosis.  Cirrhosis: Noted.  LFTs unremarkable, albumin 3.2.    DVT prophylaxis: lovenox Family Communication:none Status is: Inpatient 80Remains inpatient appropriate because: A-fib with RVR and pericardial effusion    Code Status:     Code Status Orders  (From admission, onward)           Start     Ordered   04/11/23 1628  Full code  Continuous       Question:  By:  Answer:  Default: patient does not have capacity for decision making, no surrogate or prior directive available   04/11/23 1628           Code Status History     Date Active Date Inactive Code Status Order ID Comments User Context   01/13/2023 1910 01/17/2023 2356 Full Code [id number]  Simonne Martinet, NP Inpatient   10/20/2022 0847 10/27/2022 2041 Full Code [id number]  Gleason, Markus Jarvis ED   09/16/2022 0422 09/22/2022 0105 Full Code [id number]  Lurline Del, MD ED   12/01/2021 1825 12/28/2021 0014 Full Code [id number]  Merlene Laughter, DO Inpatient         IV Access:   Peripheral IV  [facility] PROGRESS NOTE    Progress Note  Stacey Chambers  GMW:[id number] DOB: December 12, 1950 DOA: 04/11/2023 PCP: Corliss Blacker, MD     Brief Narrative:   Stacey Chambers is an 72 y.o. female past medical history significant for end-stage renal disease on hemodialysis Tuesday Thursday and Saturdays, cirrhosis ,status post pacemaker due to third-degree block, essential hypertension presents to the ED as she was having an increasing heart rate of 130s with dialysis.  Accompanied with shortness of breath and chest pain, sore throat poor appetite and abdominal pain.  Started on IV diltiazem then discontinued and changed to oral metoprolol. 2D echo showed an EF of 50% grade 1 diastolic dysfunction but a significantly larger effusion compared to previous.   Assessment/Plan:   New onset atrial fibrillation in a patient with a history of complete heart block with leadless pacemaker: Continue metoprolol and only heparin for DVT prophylaxis, anticoagulation per cardiology. Cardiology is on board was concerned that her A-fib might be related to her progression of her pericardial effusion. She remains in sinus rhythm.  Large Pericardial effusion: Status post pericardiocentesis on 04/12/2023. CT surgery drain was manipulated on 04/15/2023 large amount of fluid came out repeated echo showed small effusion. CT surgery They have signed off. Drain output has significantly decreased Limited 2D echo to evaluate effusion today. Continue to hold anticoagulation. Currently on a diet. Further management per cardiology.  Chronic diastolic heart failure: Last EF of 60% in March 2024 no wall motion abnormality GDMT limited by ESRD. Renal was consulted she status post HD on 04/12/2023 and then again on 04/13/2023. She has been wean to RA. She has no further orthopnea.  Essential hypertension: Blood pressure is borderline continue to hold Norvasc, hydralazine and  losartan.  Diabetes mellitus type 2 with complications (HCC): Started on sliding scale insulin blood glucose fairly controlled.  Chronic thrombocytopenia: Platelets are pending this morning, continue to monitor intermittently.  End stage renal disease on dialysis [facility]) Renal has been notified.  Usually dialyze Monday Wednesdays and Fridays she has been dialyzed twice on this admission.  Hypokalemia: Per-renal.  Flulike symptoms: She status post vaccine, respiratory panel is negative CT chest showed large pleural effusion with high attenuation.  With asymmetric interstitial pulmonary edema and bilateral effusions right greater than left, has mild cirrhosis.  Cirrhosis: Noted.  LFTs unremarkable, albumin 3.2.    DVT prophylaxis: lovenox Family Communication:none Status is: Inpatient 80Remains inpatient appropriate because: A-fib with RVR and pericardial effusion    Code Status:     Code Status Orders  (From admission, onward)           Start     Ordered   04/11/23 1628  Full code  Continuous       Question:  By:  Answer:  Default: patient does not have capacity for decision making, no surrogate or prior directive available   04/11/23 1628           Code Status History     Date Active Date Inactive Code Status Order ID Comments User Context   01/13/2023 1910 01/17/2023 2356 Full Code [id number]  Simonne Martinet, NP Inpatient   10/20/2022 0847 10/27/2022 2041 Full Code [id number]  Gleason, Markus Jarvis ED   09/16/2022 0422 09/22/2022 0105 Full Code [id number]  Lurline Del, MD ED   12/01/2021 1825 12/28/2021 0014 Full Code [id number]  Merlene Laughter, DO Inpatient         IV Access:   Peripheral IV  [facility] PROGRESS NOTE    Progress Note  Stacey Chambers  GMW:[id number] DOB: December 12, 1950 DOA: 04/11/2023 PCP: Corliss Blacker, MD     Brief Narrative:   Stacey Chambers is an 72 y.o. female past medical history significant for end-stage renal disease on hemodialysis Tuesday Thursday and Saturdays, cirrhosis ,status post pacemaker due to third-degree block, essential hypertension presents to the ED as she was having an increasing heart rate of 130s with dialysis.  Accompanied with shortness of breath and chest pain, sore throat poor appetite and abdominal pain.  Started on IV diltiazem then discontinued and changed to oral metoprolol. 2D echo showed an EF of 50% grade 1 diastolic dysfunction but a significantly larger effusion compared to previous.   Assessment/Plan:   New onset atrial fibrillation in a patient with a history of complete heart block with leadless pacemaker: Continue metoprolol and only heparin for DVT prophylaxis, anticoagulation per cardiology. Cardiology is on board was concerned that her A-fib might be related to her progression of her pericardial effusion. She remains in sinus rhythm.  Large Pericardial effusion: Status post pericardiocentesis on 04/12/2023. CT surgery drain was manipulated on 04/15/2023 large amount of fluid came out repeated echo showed small effusion. CT surgery They have signed off. Drain output has significantly decreased Limited 2D echo to evaluate effusion today. Continue to hold anticoagulation. Currently on a diet. Further management per cardiology.  Chronic diastolic heart failure: Last EF of 60% in March 2024 no wall motion abnormality GDMT limited by ESRD. Renal was consulted she status post HD on 04/12/2023 and then again on 04/13/2023. She has been wean to RA. She has no further orthopnea.  Essential hypertension: Blood pressure is borderline continue to hold Norvasc, hydralazine and  losartan.  Diabetes mellitus type 2 with complications (HCC): Started on sliding scale insulin blood glucose fairly controlled.  Chronic thrombocytopenia: Platelets are pending this morning, continue to monitor intermittently.  End stage renal disease on dialysis [facility]) Renal has been notified.  Usually dialyze Monday Wednesdays and Fridays she has been dialyzed twice on this admission.  Hypokalemia: Per-renal.  Flulike symptoms: She status post vaccine, respiratory panel is negative CT chest showed large pleural effusion with high attenuation.  With asymmetric interstitial pulmonary edema and bilateral effusions right greater than left, has mild cirrhosis.  Cirrhosis: Noted.  LFTs unremarkable, albumin 3.2.    DVT prophylaxis: lovenox Family Communication:none Status is: Inpatient 80Remains inpatient appropriate because: A-fib with RVR and pericardial effusion    Code Status:     Code Status Orders  (From admission, onward)           Start     Ordered   04/11/23 1628  Full code  Continuous       Question:  By:  Answer:  Default: patient does not have capacity for decision making, no surrogate or prior directive available   04/11/23 1628           Code Status History     Date Active Date Inactive Code Status Order ID Comments User Context   01/13/2023 1910 01/17/2023 2356 Full Code [id number]  Simonne Martinet, NP Inpatient   10/20/2022 0847 10/27/2022 2041 Full Code [id number]  Gleason, Markus Jarvis ED   09/16/2022 0422 09/22/2022 0105 Full Code [id number]  Lurline Del, MD ED   12/01/2021 1825 12/28/2021 0014 Full Code [id number]  Merlene Laughter, DO Inpatient         IV Access:   Peripheral IV

## 2023-04-17 NOTE — Progress Notes (Addendum)
15: 30 Progress Note.  Echo today showed small to small-moderate residual circumferential pericardial effusion, No tamponade findings.  Drain output today only 14 cc.  Pericardial drain removed at bedside BP stable at 64. Stable sinus rhythm at 83, no ectopy. Pt tolerated well. Will keep in 2H tonight.  Nicki Guadalajara, MD, [facility] 04/17/2023  3:35 PM

## 2023-04-17 NOTE — Progress Notes (Signed)
[facility] Progress Note  Subjective: seen in room, no c/o's today.  HD yest with 1L UF.  TTE today pending - if no residual effusion plan for drain removal.    Vitals:   04/17/23 0800 04/17/23 0815 04/17/23 0830 04/17/23 0900  BP: (!) 117/57   (!) 111/51  Pulse: 88 73 75 77  Resp: 15 19 18  (!) 26  Temp:      TempSrc:      SpO2: 96% 100% 99% 99%  Weight:      Height:        Exam: Gen alert, no distress, [place] O2 No jvd or bruits Chest clear bilaterally RRR no RG, pericardial drain in place R lower chest Abd soft ntnd no mass or ascites +bs Ext no LE or UE edema Neuro is alert, Ox 3 , nf    LUA AVF+bruit  - ecchymosis laterally, no erythema, fluctuance or drainage      Renal-related home meds: - norvasc 10  - hydralazine 50 bid - renvela 2 ac - losartan 100 every day - dialyvite every day    OP HD: SW MWF 3.5h  400/1.5   61.5kg   2/2 bath  AVF   Heparin none - last OP HD 10/9, post wt 65.5kg - comes 1-2kg over usually  - good compliance - mircera 75 q 2, last 10/7, due 10/21  - venofer 100 three times per week thru 10/28 - hectorol 2 ug10/7    CXR 10/09 - vasc congestion, CM   Assessment/ Plan: Pericardial effusion - w/ suspected tamponade by echo. Underwent pericardiocentesis 10/10 w/ 400 cc fluid removal. Drain in place. No window needed per CT surgery.  Plan TTE tomorrow and poss drain out tomorrow.   Afib w/ RVR - now in NSR on BB.   Anticoagulation on hold due to #1.  ESRD - on HD MWF. Last HD Mon, next tomorrow.  HTN - BP's low-normal, on metoprolol. Continue.  Volume - improved - appearing euvolemic.  UF with HD tomorrow.  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 Next esa due 10/21. Follow.  MBD ckd - CCa in range, phos a bit high. Cont IV vdra and renvela as binder. AVF infiltration - occurred Friday treatment - good thrill and bruit.  Tolerated 16g needles yesterday HD, plan same tomorrow.      Estill Bakes MD [facility] Pager  [phone]   Recent Labs  Lab 04/11/23 [phone] 04/11/23 2300 04/12/23 0510 04/13/23 0908 04/15/23 0334 04/16/23 0308 04/16/23 1002 04/17/23 0248  HGB 8.8*   < >  --    < > 8.3*  --  8.8*  --   ALBUMIN 3.2*  --  3.2*  --   --   --   --   --   CALCIUM 8.1*   < > 8.9   < > 8.7* 8.8*  --  9.0  PHOS  --   --  6.3*  --   --   --   --   --   CREATININE 3.06*   < > 5.11*   < > 4.70* 6.72*  --  4.20*  K 3.3*   < > 3.7   < > 3.7 4.4  --  4.1   < > = values in this interval not displayed.   No results for input(s): "IRON", "TIBC", "FERRITIN" in the last 168 hours. Inpatient medications:  Chlorhexidine Gluconate Cloth  6 each Topical Daily   doxercalciferol  2 mcg Intravenous  Q T,Th,Sa-HD   insulin aspart  0-6 Units Subcutaneous TID WC   metoprolol tartrate  25 mg Oral BID   senna-docusate  1 tablet Oral QHS   sevelamer carbonate  1,600 mg Oral TID WC     acetaminophen **OR** acetaminophen, albuterol, diphenhydrAMINE, guaiFENesin, HYDROcodone-acetaminophen, melatonin, ondansetron **OR** ondansetron (ZOFRAN) IV, mouth rinse, polyethylene glycol, sorbitol

## 2023-04-17 NOTE — TOC Initial Note (Signed)
Transition of Care [facility]) - Initial/Assessment Note    Patient Details  Name: Stacey Chambers MRN: [id number] Date of Birth: [birth year]  Transition of Care [facility]) CM/SW Contact:    Lawerance Sabal, RN Phone Number: 04/17/2023, 2:21 PM  Clinical Narrative:                  Spoke w patient at bedside to discuss DC planning, via stratus interpreter.    Patient states that she is from. Has roommate that is not available during the day as he works, but is available on Sunday, and they go grocery shopping and helps cook. Otherwise she states that she is ambulatory without aid, although has rollator that she does not use explains that it is difficult to use. She states that she does her own laundry and household chores.  She states that she goes to HD MWF, and has transportation set up for that.  We discussed DME and HH. She does not feel like she needs HH (was walking 370 ft w RW w PT). CM recommended RW to her, she is agreeable to try RW with 2 wheels, and a youth size has been requested from [employer].  She confirms PCP listed Corliss Blacker, MD [facility] as pharmacy of choice, but agreeable to [facility] pharmacy for DC meds.   Expected Discharge Plan: Home/Self Care Barriers to Discharge: Continued Medical Work up   Patient Goals and CMS Choice Patient states their goals for this hospitalization and ongoing recovery are:: to go home CMS Medicare.gov Compare Post Acute Care list provided to:: Patient Choice offered to / list presented to : Patient      Expected Discharge Plan and Services   Discharge Planning Services: CM Consult Post Acute Care Choice: Durable Medical Equipment Living arrangements for the past 2 months: Single Family Home (address verified with patient)                 DME Arranged: Walker rolling (youth size) DME Agency: [employer] Date DME Agency Contacted: 04/17/23 Time DME Agency Contacted: 1421 Representative spoke with at DME Agency: [facility]  Arranged: Refused HH          Prior Living Arrangements/Services Living arrangements for the past 2 months: Single Family Home (address verified with patient) Lives with:: Roommate   Do you feel safe going back to the place where you live?: Yes          Current home services: DME (rollator)    Activities of Daily Living   ADL Screening (condition at time of admission) Independently performs ADLs?: Yes (appropriate for developmental age) Is the patient deaf or have difficulty hearing?: No Does the patient have difficulty seeing, even when wearing glasses/contacts?: No Does the patient have difficulty concentrating, remembering, or making decisions?: No  Permission Sought/Granted                  Emotional Assessment              Admission diagnosis:  Atrial fibrillation (HCC) [I48.91] Pericardial effusion [I31.39] New onset a-fib (HCC) [I48.91] Rapid atrial fibrillation (HCC) [I48.91] Patient Active Problem List   Diagnosis Date Noted   Status post pericardiocentesis 04/14/2023   S/P placement of leadless cardiac pacemaker 04/14/2023   Pericardial effusion 04/12/2023   Chronic diastolic CHF (congestive heart failure) (HCC) 04/12/2023   Rapid atrial fibrillation (HCC) 04/12/2023   Paroxysmal atrial fibrillation (HCC) 04/12/2023   New onset atrial fibrillation (HCC) 04/11/2023   Near syncope 01/17/2023  Hypertension 01/15/2023   ESRD (end stage renal disease) (HCC) 01/15/2023   CHB (complete heart block) (HCC) 01/13/2023   Bradycardia 10/20/2022   Shock (HCC) 10/20/2022   Fluid overload 09/16/2022   Lumbar post-laminectomy syndrome 07/12/2022   Other chronic pain 07/12/2022   Sacrococcygeal disorders, not elsewhere classified 07/12/2022   Vertebrogenic low back pain 07/12/2022   Hypertension associated with diabetes (HCC) 03/30/2022   End stage renal disease on dialysis (HCC) 03/30/2022   Liver disease, chronic, with cirrhosis (HCC) 12/13/2021   Acute  diastolic CHF (congestive heart failure) (HCC) 12/10/2021   COVID-19 virus infection 12/10/2021   Hypertensive urgency 12/10/2021   Diabetes mellitus type 2, controlled, without complications (HCC) 12/10/2021   Acute renal failure superimposed on stage 3b chronic kidney disease (HCC) 12/10/2021   Nephrotic syndrome 12/10/2021   Normocytic anemia 12/10/2021   Low serum albumin 12/10/2021   Thrombocytopenia (HCC) 12/10/2021   Cirrhosis (HCC) 12/01/2021   Swelling of lower extremity 12/01/2021   Acute kidney injury superimposed on CKD (HCC) 12/01/2021   Diabetes mellitus type 2 with complications (HCC) 12/01/2021   Abnormal LFTs 04/29/2021   Iron deficiency 04/29/2021   Gastric ulcer 04/29/2021   Splenic lesion 04/29/2021   History of gastric ulcer 03/14/2021   History of thrombocytopenia 03/14/2021   History of iron deficiency anemia 03/14/2021   Macrocytosis 03/14/2021   PCP:  Corliss Blacker, MD Pharmacy:   [id number], [place] - [address] [place] [address] Phone: [phone] Fax: [phone]  [place] - [facility] [address] [place] [address] Phone: [phone] Fax: [phone]     Social Determinants of Health (SDOH) Social History: SDOH Screenings   Food Insecurity: No Food Insecurity (04/12/2023)  Housing: Low Risk  (04/12/2023)  Transportation Needs: No Transportation Needs (04/12/2023)  Utilities: Not At Risk (04/12/2023)  Depression (PHQ2-9): Low Risk  (07/12/2022)  Social Connections: Unknown (09/14/2022)   Received from [facility]  Tobacco Use: Low Risk  (04/11/2023)   SDOH Interventions:     Readmission Risk Interventions     No data to display

## 2023-04-18 DIAGNOSIS — I4891 Unspecified atrial fibrillation: Secondary | ICD-10-CM | POA: Diagnosis not present

## 2023-04-18 DIAGNOSIS — Z9889 Other specified postprocedural states: Secondary | ICD-10-CM | POA: Diagnosis not present

## 2023-04-18 DIAGNOSIS — I3139 Other pericardial effusion (noninflammatory): Secondary | ICD-10-CM | POA: Diagnosis not present

## 2023-04-18 DIAGNOSIS — N186 End stage renal disease: Secondary | ICD-10-CM | POA: Diagnosis not present

## 2023-04-18 LAB — BASIC METABOLIC PANEL
Anion gap: 12 (ref 5–15)
BUN: 27 mg/dL — ABNORMAL HIGH (ref 8–23)
CO2: 26 mmol/L (ref 22–32)
Calcium: 8.6 mg/dL — ABNORMAL LOW (ref 8.9–10.3)
Chloride: 94 mmol/L — ABNORMAL LOW (ref 98–111)
Creatinine, Ser: 5.76 mg/dL — ABNORMAL HIGH (ref 0.44–1.00)
GFR, Estimated: 7 mL/min — ABNORMAL LOW (ref >60)
Glucose, Bld: 123 mg/dL — ABNORMAL HIGH (ref 70–99)
Potassium: 4 mmol/L (ref 3.5–5.1)
Sodium: 132 mmol/L — ABNORMAL LOW (ref 135–145)

## 2023-04-18 LAB — CBC
HCT: 23.8 % — ABNORMAL LOW (ref 36.0–46.0)
Hemoglobin: 7.7 g/dL — ABNORMAL LOW (ref 12.0–15.0)
MCH: 33.8 pg (ref 26.0–34.0)
MCHC: 32.4 g/dL (ref 30.0–36.0)
MCV: 104.4 fL — ABNORMAL HIGH (ref 80.0–100.0)
Platelets: 124 10*3/uL — ABNORMAL LOW (ref 150–400)
RBC: 2.28 MIL/uL — ABNORMAL LOW (ref 3.87–5.11)
RDW: 15.3 % (ref 11.5–15.5)
WBC: 5.5 10*3/uL (ref 4.0–10.5)
nRBC: 0 % (ref 0.0–0.2)

## 2023-04-18 LAB — COMPREHENSIVE METABOLIC PANEL
ALT: 18 U/L (ref 0–44)
AST: 28 U/L (ref 15–41)
Albumin: 3 g/dL — ABNORMAL LOW (ref 3.5–5.0)
Alkaline Phosphatase: 87 U/L (ref 38–126)
Anion gap: 14 (ref 5–15)
BUN: 31 mg/dL — ABNORMAL HIGH (ref 8–23)
CO2: 25 mmol/L (ref 22–32)
Calcium: 9 mg/dL (ref 8.9–10.3)
Chloride: 93 mmol/L — ABNORMAL LOW (ref 98–111)
Creatinine, Ser: 6.45 mg/dL — ABNORMAL HIGH (ref 0.44–1.00)
GFR, Estimated: 7 mL/min — ABNORMAL LOW (ref >60)
Glucose, Bld: 92 mg/dL (ref 70–99)
Potassium: 4.2 mmol/L (ref 3.5–5.1)
Sodium: 132 mmol/L — ABNORMAL LOW (ref 135–145)
Total Bilirubin: 0.7 mg/dL (ref 0.3–1.2)
Total Protein: 7.2 g/dL (ref 6.5–8.1)

## 2023-04-18 LAB — GLUCOSE, CAPILLARY
Glucose-Capillary: 89 mg/dL (ref 70–99)
Glucose-Capillary: 90 mg/dL (ref 70–99)
Glucose-Capillary: 92 mg/dL (ref 70–99)
Glucose-Capillary: 111 mg/dL — ABNORMAL HIGH (ref 70–99)

## 2023-04-18 LAB — MAGNESIUM: Magnesium: 2.5 mg/dL — ABNORMAL HIGH (ref 1.7–2.4)

## 2023-04-18 MED ORDER — METOPROLOL SUCCINATE ER 50 MG PO TB24
50.0000 mg | ORAL_TABLET | Freq: Every day | ORAL | Status: DC
  Administered 2023-04-19: 50 mg via ORAL
  Filled 2023-04-18 (×2): qty 1

## 2023-04-18 NOTE — Progress Notes (Signed)
Pt Hgb 7.7 at 1251 04/18/2023 pt asymptomatic, Dr Natale Milch aware.

## 2023-04-18 NOTE — Progress Notes (Signed)
Pt back returned to room from HD, vital signs stable, no sign symptoms of distress noted at this time. Fresh water provided and other requests met.

## 2023-04-18 NOTE — Progress Notes (Signed)
PROGRESS NOTE    Stacey Chambers  WUJ:[id number] DOB: Aug 06, 1950 DOA: 04/11/2023 PCP: Corliss Blacker, MD   Brief Narrative:  Stacey Chambers is an 72 y.o. female past medical history significant for end-stage renal disease on hemodialysis Tuesday Thursday and Saturdays, cirrhosis ,status post pacemaker due to third-degree block, essential hypertension presents to the ED as she was having an increasing heart rate of 130s with dialysis.  Accompanied with shortness of breath and chest pain, sore throat poor appetite and abdominal pain.  Started on IV diltiazem then discontinued and changed to oral metoprolol. 2D echo showed an EF of 50% grade 1 diastolic dysfunction but a significantly larger effusion compared to previous.  Assessment & Plan:   Principal Problem:   New onset atrial fibrillation (HCC) Active Problems:   Diabetes mellitus type 2 with complications (HCC)   Diabetes mellitus type 2, controlled, without complications (HCC)   End stage renal disease on dialysis [facility])   Hypertension   Pericardial effusion   Chronic diastolic CHF (congestive heart failure) (HCC)   Rapid atrial fibrillation (HCC)   Paroxysmal atrial fibrillation (HCC)   Status post pericardiocentesis   S/P placement of leadless cardiac pacemaker   New onset atrial fibrillation in a patient with a history of complete heart block with leadless pacemaker, resolved: Continue metoprolol - now in sinus rhythm Holding anticoagulation per cardiology   Large Pericardial effusion: Status post pericardiocentesis on 04/12/2023. CT surgery drain was manipulated on 04/15/2023 large amount of fluid came out repeated echo showed small effusion - CT surgery signed off - drain removed 10/15/ Continue to hold anticoagulation. Currently on a diet. Further management per cardiology.   Chronic diastolic heart failure, improving Acute hypoxic respiratory failure, resolved Last EF of 60% in March 2024 no wall  motion abnormality GDMT limited by ESRD. Renal was consulted she status post HD on 04/12/2023 and then again on 04/13/2023. She has been wean to RA. She has no further orthopnea.   Essential hypertension: Blood pressure is borderline continue to hold amlodipine, hydralazine and losartan.   Diabetes mellitus type 2 with complications (HCC): Started on sliding scale insulin blood glucose fairly controlled.   Chronic thrombocytopenia: Platelets are pending this morning, continue to monitor intermittently.   End stage renal disease on dialysis (HCC) Nephro following - continue HD per Palpable thrill LUE AV fistula   Hypokalemia: Per-renal.   Flulike symptoms, resolved: She status post vaccine, respiratory panel is negative Pleural effusion with asymmetric interstitial pulmonary edema and bilateral effusions right greater than left secondary to above   Cirrhosis: Noted.  LFTs unremarkable, albumin 3.2.   DVT prophylaxis:    Code Status:   Code Status: Full Code  Family Communication: None present  Status is: Inpt  Dispo: The patient is from: home              Anticipated d/c is to: home              Anticipated d/c date is: 24-48h              Patient currently NOT medically stable for discharge  Consultants:  Cardiology, CT Sx  Procedures:  Pericardial drain placement  Subjective: No acute issues or events overnight, denies nausea vomiting diarrhea constipation headache fevers chills or shortness of breath  Objective: Vitals:   04/18/23 0400 04/18/23 0435 04/18/23 0500 04/18/23 0615  BP:  (!) 96/54 (!) 108/56 (!) 91/53  Pulse: 67 67 69 69  Resp:  PROGRESS NOTE    Stacey Chambers  WUJ:[id number] DOB: Aug 06, 1950 DOA: 04/11/2023 PCP: Corliss Blacker, MD   Brief Narrative:  Stacey Chambers is an 72 y.o. female past medical history significant for end-stage renal disease on hemodialysis Tuesday Thursday and Saturdays, cirrhosis ,status post pacemaker due to third-degree block, essential hypertension presents to the ED as she was having an increasing heart rate of 130s with dialysis.  Accompanied with shortness of breath and chest pain, sore throat poor appetite and abdominal pain.  Started on IV diltiazem then discontinued and changed to oral metoprolol. 2D echo showed an EF of 50% grade 1 diastolic dysfunction but a significantly larger effusion compared to previous.  Assessment & Plan:   Principal Problem:   New onset atrial fibrillation (HCC) Active Problems:   Diabetes mellitus type 2 with complications (HCC)   Diabetes mellitus type 2, controlled, without complications (HCC)   End stage renal disease on dialysis [facility])   Hypertension   Pericardial effusion   Chronic diastolic CHF (congestive heart failure) (HCC)   Rapid atrial fibrillation (HCC)   Paroxysmal atrial fibrillation (HCC)   Status post pericardiocentesis   S/P placement of leadless cardiac pacemaker   New onset atrial fibrillation in a patient with a history of complete heart block with leadless pacemaker, resolved: Continue metoprolol - now in sinus rhythm Holding anticoagulation per cardiology   Large Pericardial effusion: Status post pericardiocentesis on 04/12/2023. CT surgery drain was manipulated on 04/15/2023 large amount of fluid came out repeated echo showed small effusion - CT surgery signed off - drain removed 10/15/ Continue to hold anticoagulation. Currently on a diet. Further management per cardiology.   Chronic diastolic heart failure, improving Acute hypoxic respiratory failure, resolved Last EF of 60% in March 2024 no wall  motion abnormality GDMT limited by ESRD. Renal was consulted she status post HD on 04/12/2023 and then again on 04/13/2023. She has been wean to RA. She has no further orthopnea.   Essential hypertension: Blood pressure is borderline continue to hold amlodipine, hydralazine and losartan.   Diabetes mellitus type 2 with complications (HCC): Started on sliding scale insulin blood glucose fairly controlled.   Chronic thrombocytopenia: Platelets are pending this morning, continue to monitor intermittently.   End stage renal disease on dialysis (HCC) Nephro following - continue HD per Palpable thrill LUE AV fistula   Hypokalemia: Per-renal.   Flulike symptoms, resolved: She status post vaccine, respiratory panel is negative Pleural effusion with asymmetric interstitial pulmonary edema and bilateral effusions right greater than left secondary to above   Cirrhosis: Noted.  LFTs unremarkable, albumin 3.2.   DVT prophylaxis:    Code Status:   Code Status: Full Code  Family Communication: None present  Status is: Inpt  Dispo: The patient is from: home              Anticipated d/c is to: home              Anticipated d/c date is: 24-48h              Patient currently NOT medically stable for discharge  Consultants:  Cardiology, CT Sx  Procedures:  Pericardial drain placement  Subjective: No acute issues or events overnight, denies nausea vomiting diarrhea constipation headache fevers chills or shortness of breath  Objective: Vitals:   04/18/23 0400 04/18/23 0435 04/18/23 0500 04/18/23 0615  BP:  (!) 96/54 (!) 108/56 (!) 91/53  Pulse: 67 67 69 69  Resp:  Radiology Studies: ECHOCARDIOGRAM  LIMITED  Result Date: 04/17/2023    ECHOCARDIOGRAM LIMITED REPORT   Patient Name:   Tamirra AMAVILIA Demary Date of Exam: 04/17/2023 Medical Rec #:  [id number]             Height:       61.0 in Accession #:    [id number]            Weight:       132.3 lb Date of Birth:  [birth year]            BSA:          1.584 m Patient Age:    71 years              BP:           111/51 mmHg Patient Gender: F                     HR:           51 bpm. Exam Location:  Inpatient Procedure: Limited Echo, Cardiac Doppler and Color Doppler Indications:    I31.3 Pericardial effusion (noninflammatory)  History:        Patient has prior history of Echocardiogram examinations, most                 recent 04/15/2023. Pericardial Disease, Abnormal ECG and                 Pacemaker, Arrythmias:Atrial Fibrillation and Bradycardia; Risk                 Factors:Hypertension and Diabetes. ESRD. SHock. Pericardial                 effusion.  Sonographer:    Sheralyn Boatman RDCS Referring Phys: [phone] THOMAS A KELLY IMPRESSIONS  1. Limted echo: Small to moderate pericardial effusion. The pericardial effusion is circumferential. There is no evidence of cardiac tamponade. Moderate pleural effusion in the left lateral region.  2. Left ventricular ejection fraction, by estimation, is 60 to 65%. The left ventricle has normal function. The left ventricle has no regional wall motion abnormalities. There is mild concentric left ventricular hypertrophy.  3. Right ventricular systolic function is normal. The right ventricular size is normal.  4. The aortic valve was not well visualized. Aortic valve regurgitation is not visualized. Aortic valve sclerosis is present, with no evidence of aortic valve stenosis.  5. The inferior vena cava is normal in size with <50% respiratory variability, suggesting right atrial pressure of 8 mmHg. Comparison(s): Prior images reviewed side by side. Pericardial effusion size has slightly decreased. FINDINGS  Left Ventricle: Left  ventricular ejection fraction, by estimation, is 60 to 65%. The left ventricle has normal function. The left ventricle has no regional wall motion abnormalities. The left ventricular internal cavity size was normal in size. There is  mild concentric left ventricular hypertrophy. Right Ventricle: The right ventricular size is normal. Right ventricular systolic function is normal. Pericardium: A moderately sized pericardial effusion is present. The pericardial effusion is circumferential. There is no evidence of cardiac tamponade. Aortic Valve: The aortic valve was not well visualized. Aortic valve regurgitation is not visualized. Aortic valve sclerosis is present, with no evidence of aortic valve stenosis. Venous: The inferior vena cava is normal in size with less than 50% respiratory variability, suggesting right atrial pressure of 8 mmHg. Additional Comments: There is a moderate pleural effusion in the left lateral region. Spectral Doppler performed. Color Doppler  Radiology Studies: ECHOCARDIOGRAM  LIMITED  Result Date: 04/17/2023    ECHOCARDIOGRAM LIMITED REPORT   Patient Name:   Tamirra AMAVILIA Demary Date of Exam: 04/17/2023 Medical Rec #:  [id number]             Height:       61.0 in Accession #:    [id number]            Weight:       132.3 lb Date of Birth:  [birth year]            BSA:          1.584 m Patient Age:    71 years              BP:           111/51 mmHg Patient Gender: F                     HR:           51 bpm. Exam Location:  Inpatient Procedure: Limited Echo, Cardiac Doppler and Color Doppler Indications:    I31.3 Pericardial effusion (noninflammatory)  History:        Patient has prior history of Echocardiogram examinations, most                 recent 04/15/2023. Pericardial Disease, Abnormal ECG and                 Pacemaker, Arrythmias:Atrial Fibrillation and Bradycardia; Risk                 Factors:Hypertension and Diabetes. ESRD. SHock. Pericardial                 effusion.  Sonographer:    Sheralyn Boatman RDCS Referring Phys: [phone] THOMAS A KELLY IMPRESSIONS  1. Limted echo: Small to moderate pericardial effusion. The pericardial effusion is circumferential. There is no evidence of cardiac tamponade. Moderate pleural effusion in the left lateral region.  2. Left ventricular ejection fraction, by estimation, is 60 to 65%. The left ventricle has normal function. The left ventricle has no regional wall motion abnormalities. There is mild concentric left ventricular hypertrophy.  3. Right ventricular systolic function is normal. The right ventricular size is normal.  4. The aortic valve was not well visualized. Aortic valve regurgitation is not visualized. Aortic valve sclerosis is present, with no evidence of aortic valve stenosis.  5. The inferior vena cava is normal in size with <50% respiratory variability, suggesting right atrial pressure of 8 mmHg. Comparison(s): Prior images reviewed side by side. Pericardial effusion size has slightly decreased. FINDINGS  Left Ventricle: Left  ventricular ejection fraction, by estimation, is 60 to 65%. The left ventricle has normal function. The left ventricle has no regional wall motion abnormalities. The left ventricular internal cavity size was normal in size. There is  mild concentric left ventricular hypertrophy. Right Ventricle: The right ventricular size is normal. Right ventricular systolic function is normal. Pericardium: A moderately sized pericardial effusion is present. The pericardial effusion is circumferential. There is no evidence of cardiac tamponade. Aortic Valve: The aortic valve was not well visualized. Aortic valve regurgitation is not visualized. Aortic valve sclerosis is present, with no evidence of aortic valve stenosis. Venous: The inferior vena cava is normal in size with less than 50% respiratory variability, suggesting right atrial pressure of 8 mmHg. Additional Comments: There is a moderate pleural effusion in the left lateral region. Spectral Doppler performed. Color Doppler  Radiology Studies: ECHOCARDIOGRAM  LIMITED  Result Date: 04/17/2023    ECHOCARDIOGRAM LIMITED REPORT   Patient Name:   Tamirra AMAVILIA Demary Date of Exam: 04/17/2023 Medical Rec #:  [id number]             Height:       61.0 in Accession #:    [id number]            Weight:       132.3 lb Date of Birth:  [birth year]            BSA:          1.584 m Patient Age:    71 years              BP:           111/51 mmHg Patient Gender: F                     HR:           51 bpm. Exam Location:  Inpatient Procedure: Limited Echo, Cardiac Doppler and Color Doppler Indications:    I31.3 Pericardial effusion (noninflammatory)  History:        Patient has prior history of Echocardiogram examinations, most                 recent 04/15/2023. Pericardial Disease, Abnormal ECG and                 Pacemaker, Arrythmias:Atrial Fibrillation and Bradycardia; Risk                 Factors:Hypertension and Diabetes. ESRD. SHock. Pericardial                 effusion.  Sonographer:    Sheralyn Boatman RDCS Referring Phys: [phone] THOMAS A KELLY IMPRESSIONS  1. Limted echo: Small to moderate pericardial effusion. The pericardial effusion is circumferential. There is no evidence of cardiac tamponade. Moderate pleural effusion in the left lateral region.  2. Left ventricular ejection fraction, by estimation, is 60 to 65%. The left ventricle has normal function. The left ventricle has no regional wall motion abnormalities. There is mild concentric left ventricular hypertrophy.  3. Right ventricular systolic function is normal. The right ventricular size is normal.  4. The aortic valve was not well visualized. Aortic valve regurgitation is not visualized. Aortic valve sclerosis is present, with no evidence of aortic valve stenosis.  5. The inferior vena cava is normal in size with <50% respiratory variability, suggesting right atrial pressure of 8 mmHg. Comparison(s): Prior images reviewed side by side. Pericardial effusion size has slightly decreased. FINDINGS  Left Ventricle: Left  ventricular ejection fraction, by estimation, is 60 to 65%. The left ventricle has normal function. The left ventricle has no regional wall motion abnormalities. The left ventricular internal cavity size was normal in size. There is  mild concentric left ventricular hypertrophy. Right Ventricle: The right ventricular size is normal. Right ventricular systolic function is normal. Pericardium: A moderately sized pericardial effusion is present. The pericardial effusion is circumferential. There is no evidence of cardiac tamponade. Aortic Valve: The aortic valve was not well visualized. Aortic valve regurgitation is not visualized. Aortic valve sclerosis is present, with no evidence of aortic valve stenosis. Venous: The inferior vena cava is normal in size with less than 50% respiratory variability, suggesting right atrial pressure of 8 mmHg. Additional Comments: There is a moderate pleural effusion in the left lateral region. Spectral Doppler performed. Color Doppler

## 2023-04-18 NOTE — Progress Notes (Signed)
Received patient in bed to unit.  Alert and oriented.  Informed consent signed and in chart.   TX duration:3 hours  Patient tolerated well.  Transported back to the room  Alert, without acute distress.  Hand-off given to patient's nurse.   Access used: Left Upper Arm Fistula Access issues: none  Total UF removed: 2L Medication(s) given: Tylenol and Benedryl, see MAR   04/18/23 1600  Vitals  Temp 98.4 F (36.9 C)  Temp Source Oral  BP (!) 103/48  MAP (mmHg) (!) 63  BP Location Right Arm  BP Method Automatic  Patient Position (if appropriate) Lying  Pulse Rate 80  Pulse Rate Source Monitor  ECG Heart Rate 84  Resp (!) 21  Oxygen Therapy  SpO2 100 %  O2 Device Room Air  During Treatment Monitoring  Blood Flow Rate (mL/min) 349 mL/min  Arterial Pressure (mmHg) -203.83 mmHg  Venous Pressure (mmHg) 210.09 mmHg  TMP (mmHg) 18.99 mmHg  Ultrafiltration Rate (mL/min) 773 mL/min  Dialysate Flow Rate (mL/min) 299 ml/min  Duration of HD Treatment -hour(s) 2.97 hour(s)  Cumulative Fluid Removed (mL) per Treatment  1977.21  HD Safety Checks Performed Yes  Intra-Hemodialysis Comments Tx completed  Fistula / Graft Left Upper arm Arteriovenous fistula  Placement Date/Time: 12/23/21 0816   Orientation: Left  Access Location: (c) Upper arm  Access Type: Arteriovenous fistula  Status Deaccessed     Stacie Glaze LPN [facility]

## 2023-04-18 NOTE — Progress Notes (Signed)
[facility] Progress Note  Subjective: seen in room, no c/o's today.  Pericardial drain out.  For HD today in HD unit.   Vitals:   04/18/23 0435 04/18/23 0500 04/18/23 0615 04/18/23 0700  BP: (!) 96/54 (!) 108/56 (!) 91/53 114/60  Pulse: 67 69 69 69  Resp:      Temp: 97.6 F (36.4 C)   98 F (36.7 C)  TempSrc: Oral   Oral  SpO2: 99% 98% 97% 98%  Weight:      Height:        Exam: Gen alert, no distress, [place] O2 No jvd or bruits Chest clear bilaterally RRR no RG, pericardial drain in place R lower chest Abd soft ntnd no mass or ascites +bs Ext no LE or UE edema Neuro is alert, Ox 3 , nf    LUA AVF+bruit  - ecchymosis laterally, no erythema, fluctuance or drainage      Renal-related home meds: - norvasc 10  - hydralazine 50 bid - renvela 2 ac - losartan 100 every day - dialyvite every day    OP HD: SW MWF 3.5h  400/1.5   61.5kg   2/2 bath  AVF   Heparin none - last OP HD 10/9, post wt 65.5kg - comes 1-2kg over usually  - good compliance - mircera 75 q 2, last 10/7, due 10/21  - venofer 100 three times per week thru 10/28 - hectorol 2 ug10/7    CXR 10/09 - vasc congestion, CM   Assessment/ Plan: Pericardial effusion - w/ suspected tamponade by echo. Underwent pericardiocentesis 10/10 w/ 400 cc fluid removal followed by drain placement which was removed 10/15. Afib w/ RVR - now in NSR on BB.   Anticoagulation on hold due to #1.  ESRD - on HD MWF. Last HD Mon, next today.  HTN - BP's low-normal, on metoprolol. Continue.  Volume - improved - appearing euvolemic.  UF with HD today, 2L goal.  Below EDW at this point.  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 Next esa due 10/21. Follow.  MBD ckd - CCa in range, phos a bit high. Cont IV vdra and renvela as binder. AVF infiltration - occurred Friday treatment - good thrill and bruit.  Tolerated 16g needles Mon, plan same today.      Estill Bakes MD [facility] Pager [phone]   Recent Labs  Lab  04/11/23 [phone] 04/11/23 2300 04/12/23 0510 04/13/23 0908 04/15/23 0334 04/16/23 0308 04/16/23 1002 04/17/23 0248 04/18/23 0227  HGB 8.8*   < >  --    < > 8.3*  --  8.8*  --   --   ALBUMIN 3.2*  --  3.2*  --   --   --   --   --   --   CALCIUM 8.1*   < > 8.9   < > 8.7*   < >  --  9.0 8.6*  PHOS  --   --  6.3*  --   --   --   --   --   --   CREATININE 3.06*   < > 5.11*   < > 4.70*   < >  --  4.20* 5.76*  K 3.3*   < > 3.7   < > 3.7   < >  --  4.1 4.0   < > = values in this interval not displayed.   No results for input(s): "IRON", "TIBC", "FERRITIN" in the last 168 hours. Inpatient medications:  Chlorhexidine Gluconate Cloth  6 each Topical Daily   doxercalciferol  2 mcg Intravenous Q T,Th,Sa-HD   insulin aspart  0-6 Units Subcutaneous TID WC   metoprolol tartrate  25 mg Oral BID   senna-docusate  1 tablet Oral QHS   sevelamer carbonate  1,600 mg Oral TID WC     acetaminophen **OR** acetaminophen, albuterol, diphenhydrAMINE, guaiFENesin, HYDROcodone-acetaminophen, melatonin, ondansetron **OR** ondansetron (ZOFRAN) IV, mouth rinse, polyethylene glycol, sorbitol

## 2023-04-18 NOTE — Progress Notes (Signed)
Rounding Note    Patient Name: Stacey Chambers Date of Encounter: 04/18/2023  [facility] Cardiologist: Maisie Fus, MD   Subjective   No chest pain; pericardial drain removed 10/15  Inpatient Medications    Scheduled Meds:  Chlorhexidine Gluconate Cloth  6 each Topical Daily   doxercalciferol  2 mcg Intravenous Q T,Th,Sa-HD   insulin aspart  0-6 Units Subcutaneous TID WC   metoprolol tartrate  25 mg Oral BID   senna-docusate  1 tablet Oral QHS   sevelamer carbonate  1,600 mg Oral TID WC   Continuous Infusions:   PRN Meds: acetaminophen **OR** acetaminophen, albuterol, diphenhydrAMINE, guaiFENesin, HYDROcodone-acetaminophen, melatonin, ondansetron **OR** ondansetron (ZOFRAN) IV, mouth rinse, polyethylene glycol, sorbitol   Vital Signs    Vitals:   04/18/23 0435 04/18/23 0500 04/18/23 0615 04/18/23 0700  BP: (!) 96/54 (!) 108/56 (!) 91/53 114/60  Pulse: 67 69 69 69  Resp:      Temp: 97.6 F (36.4 C)   98 F (36.7 C)  TempSrc: Oral   Oral  SpO2: 99% 98% 97% 98%  Weight:      Height:        Intake/Output Summary (Last 24 hours) at 04/18/2023 0901 Last data filed at 04/17/2023 2000 Gross per 24 hour  Intake 140 ml  Output 13 ml  Net 127 ml   I/O since admission: - 4584      04/17/2023    6:00 AM 04/16/2023    5:00 AM 04/15/2023    4:00 AM  Last 3 Weights  Weight (lbs) 132 lb 4.4 oz 134 lb 0.6 oz 136 lb 3.9 oz  Weight (kg) 60 kg 60.8 kg 61.8 kg      Telemetry    Sinus at 66 - Personally Reviewed  ECG    04/12/2023 ECG - Sinus at 68, RBBB, Personally Reviewed  Physical Exam   BP 114/60   Pulse 69   Temp 98 F (36.7 C) (Oral)   Resp 17   Ht 5\' 1"  (1.549 m)   Wt 60 kg   SpO2 98%   BMI 24.99 kg/m  General: Alert, oriented, no distress.  Skin: normal turgor, no rashes, warm and dry HEENT: Normocephalic, atraumatic. Pupils equal round and reactive to light; sclera anicteric; extraocular muscles intact;  Nose without  nasal septal hypertrophy Mouth/Parynx benign; Mallinpatti scale 3 Neck: No JVD, no carotid bruits; normal carotid upstroke Lungs: clear to ausculatation and percussion; no wheezing or rales Chest wall: without tenderness to palpitation Heart: PMI not displaced, RRR, s1 s2 normal, 1/6 systolic murmur, no diastolic murmur, no rubs, gallops, thrills, or heaves Abdomen: soft, nontender; no hepatosplenomehaly, BS+; abdominal aorta nontender and not dilated by palpation. Back: no CVA tenderness Pulses 2+ Musculoskeletal: full range of motion, normal strength, no joint deformities Extremities: L upper arm AV fistula with ecchymosis;  dialysis no clubbing cyanosis or edema, Homan's sign negative  Neurologic: grossly nonfocal; Cranial nerves grossly wnl Psychologic: Normal mood and affect    Labs    High Sensitivity Troponin:   Recent Labs  Lab 04/11/23 0951 04/11/23 1200  TROPONINIHS 9 11     Chemistry Recent Labs  Lab 04/11/23 0951 04/11/23 2300 04/12/23 0510 04/13/23 0907 04/16/23 0308 04/17/23 0248 04/18/23 0227  NA 134*   < > 134*   < > 133* 134* 132*  K 3.3*   < > 3.7   < > 4.4 4.1 4.0  CL 90*   < > 92*   < >  93* 94* 94*  CO2 26   < > 26   < > 27 27 26   GLUCOSE 108*   < > 117*   < > 108* 96 123*  BUN 15   < > 25*   < > 33* 16 27*  CREATININE 3.06*   < > 5.11*   < > 6.72* 4.20* 5.76*  CALCIUM 8.1*   < > 8.9   < > 8.8* 9.0 8.6*  MG  --   --   --    < > 2.6* 2.3 2.5*  PROT 7.7  --  7.5  --   --   --   --   ALBUMIN 3.2*  --  3.2*  --   --   --   --   AST 23  --  21  --   --   --   --   ALT 13  --  14  --   --   --   --   ALKPHOS 81  --  86  --   --   --   --   BILITOT 1.0  --  0.9  --   --   --   --   GFRNONAA 16*   < > 9*   < > 6* 11* 7*  ANIONGAP 18*   < > 16*   < > 13 13 12    < > = values in this interval not displayed.    Lipids No results for input(s): "CHOL", "TRIG", "HDL", "LABVLDL", "LDLCALC", "CHOLHDL" in the last 168 hours.  Hematology Recent Labs  Lab  04/14/23 0334 04/15/23 0334 04/16/23 1002  WBC 7.1 7.4 5.9  RBC 2.65* 2.43* 2.52*  HGB 9.0* 8.3* 8.8*  HCT 27.2* 25.1* 26.6*  MCV 102.6* 103.3* 105.6*  MCH 34.0 34.2* 34.9*  MCHC 33.1 33.1 33.1  RDW 14.9 15.3 15.7*  PLT 127* 119* 146*   Thyroid No results for input(s): "TSH", "FREET4" in the last 168 hours.  BNPNo results for input(s): "BNP", "PROBNP" in the last 168 hours.  DDimer No results for input(s): "DDIMER" in the last 168 hours.   Radiology    ECHOCARDIOGRAM LIMITED  Result Date: 04/17/2023    ECHOCARDIOGRAM LIMITED REPORT   Patient Name:   Stacey Chambers Date of Exam: 04/17/2023 Medical Rec #:  [id number]             Height:       61.0 in Accession #:    [id number]            Weight:       132.3 lb Date of Birth:  02-Jul-1951            BSA:          1.584 m Patient Age:    3 years              BP:           111/51 mmHg Patient Gender: F                     HR:           51 bpm. Exam Location:  Inpatient Procedure: Limited Echo, Cardiac Doppler and Color Doppler Indications:    I31.3 Pericardial effusion (noninflammatory)  History:        Patient has prior history of Echocardiogram examinations, most                 recent  04/15/2023. Pericardial Disease, Abnormal ECG and                 Pacemaker, Arrythmias:Atrial Fibrillation and Bradycardia; Risk                 Factors:Hypertension and Diabetes. ESRD. SHock. Pericardial                 effusion.  Sonographer:    Sheralyn Boatman RDCS Referring Phys: [phone] Dreama Kuna A Mckenzie Toruno IMPRESSIONS  1. Limted echo: Small to moderate pericardial effusion. The pericardial effusion is circumferential. There is no evidence of cardiac tamponade. Moderate pleural effusion in the left lateral region.  2. Left ventricular ejection fraction, by estimation, is 60 to 65%. The left ventricle has normal function. The left ventricle has no regional wall motion abnormalities. There is mild concentric left ventricular hypertrophy.  3. Right ventricular systolic  function is normal. The right ventricular size is normal.  4. The aortic valve was not well visualized. Aortic valve regurgitation is not visualized. Aortic valve sclerosis is present, with no evidence of aortic valve stenosis.  5. The inferior vena cava is normal in size with <50% respiratory variability, suggesting right atrial pressure of 8 mmHg. Comparison(s): Prior images reviewed side by side. Pericardial effusion size has slightly decreased. FINDINGS  Left Ventricle: Left ventricular ejection fraction, by estimation, is 60 to 65%. The left ventricle has normal function. The left ventricle has no regional wall motion abnormalities. The left ventricular internal cavity size was normal in size. There is  mild concentric left ventricular hypertrophy. Right Ventricle: The right ventricular size is normal. Right ventricular systolic function is normal. Pericardium: A moderately sized pericardial effusion is present. The pericardial effusion is circumferential. There is no evidence of cardiac tamponade. Aortic Valve: The aortic valve was not well visualized. Aortic valve regurgitation is not visualized. Aortic valve sclerosis is present, with no evidence of aortic valve stenosis. Venous: The inferior vena cava is normal in size with less than 50% respiratory variability, suggesting right atrial pressure of 8 mmHg. Additional Comments: There is a moderate pleural effusion in the left lateral region. Spectral Doppler performed. Color Doppler performed.  LEFT VENTRICLE PLAX 2D LVIDd:         4.40 cm LVIDs:         2.70 cm LV PW:         1.00 cm LV IVS:        1.10 cm  IVC IVC diam: 1.80 cm Riley Lam MD Electronically signed by Riley Lam MD Signature Date/Time: 04/17/2023/10:02:58 AM    Final     Cardiac Studies   Pericardiocentesis: 04/12/2023 1.  Successful pericardial drain placement with decrease in pericardial pressure from 28 mmHg to 11 mmHg after evacuation of 400 cc of dark bloody  fluid.  The drain was secured in place and the patient was transported 2H unit for further evaluation and treatment.  ECHO: 04/12/2023  1. Left ventricular ejection fraction, by estimation, is 60 to 65%. The  left ventricle has normal function. The left ventricle has no regional  wall motion abnormalities. Left ventricular diastolic parameters are  consistent with Grade I diastolic  dysfunction (impaired relaxation).   2. Right ventricular systolic function is normal. The right ventricular  size is normal. There is normal pulmonary artery systolic pressure.   3. Pericardial effusion measures up to 3.2 cm. Comapred with the echo  09/2022, pericardial effusion is much larger. Echo findings of diastolic RA  and RV collapse as  well as >25% respiratory inflow varation are suggestive  of cardiac tamponade. The IVC is  not dilated, but doesn't collapse, indicating RA pressure 8 mmHg. Suggest  clinical correlation as patient has several features suggesting that she  is at risk for tamponade. Large pericardial effusion. The pericardial  effusion is circumferential.   4. The mitral valve is normal in structure. Mild mitral valve  regurgitation. No evidence of mitral stenosis.   5. The aortic valve was not well visualized. There is mild calcification  of the aortic valve. There is mild thickening of the aortic valve. Aortic  valve regurgitation is not visualized. No aortic stenosis is present.   6. The inferior vena cava is normal in size with <50% respiratory  variability, suggesting right atrial pressure of 8 mmHg.    1. LIMITED ECHO: 04/13/2023 - See report 04/12/2023 for details.   2. Left ventricular ejection fraction, by estimation, is 60 to 65%. The  left ventricle has normal function.   3. Moderate pericardial effusion. The pericardial effusion is  circumferential. There is no evidence of cardiac tamponade.   4. The inferior vena cava is normal in size with <50% respiratory  variability,  suggesting right atrial pressure of 8 mmHg.   Comparison(s): A prior study was performed on 04/12/23. Reported large  pericardial effusion with hemodynamic features suggestive of tamponade.     LIMITED ECHO: 04/14/2023  1. Left ventricular ejection fraction, by estimation, is 60 to 65%. The  left ventricle has normal function.   2. Right ventricular systolic function is normal. The right ventricular  size is normal.   3. Moderate pericardial effusion. Large pleural effusion.   Comparison(s): No significant change from prior study.    04/16/2023  1. Limted echo: Small to moderate pericardial effusion. The pericardial  effusion is circumferential. There is no evidence of cardiac tamponade.  Moderate pleural effusion in the left lateral region.   2. Left ventricular ejection fraction, by estimation, is 60 to 65%. The  left ventricle has normal function. The left ventricle has no regional  wall motion abnormalities. There is mild concentric left ventricular  hypertrophy.   3. Right ventricular systolic function is normal. The right ventricular  size is normal.   4. The aortic valve was not well visualized. Aortic valve regurgitation  is not visualized. Aortic valve sclerosis is present, with no evidence of  aortic valve stenosis.   5. The inferior vena cava is normal in size with <50% respiratory  variability, suggesting right atrial pressure of 8 mmHg.   Comparison(s): Prior images reviewed side by side. Pericardial effusion  size has slightly decreased.    Patient Profile     72 y.o. female  with a hx of ESRD on hemodialysis, alcohol cirrhosis, hypertension, DM, anemia, complete heart block s/p leadless PPM who is being seen 04/11/2023 for the evaluation of paroxysmal afib at the request of Dr. Posey Rea and Evlyn Kanner PA-C.   Assessment & Plan    Large pericardial effusion effusion with initial tamponade, patient underwent thoracentesis on April 12, 2023 with removal of 400  cc of dark bloody fluid with improvement in pericardial pressure from 28 to 11 mmHg.  Patient has had continued drainage. Subsequent limited echo continued to show residual moderate effusion. Drain was manipulated on 10/12 with improved output.  Total pericardial fluid removed 04/15/23 450 cc and patient had an additional 70 cc removal overnight.  10/14 drainage continued but decreased to 30cc  and 14 cc 10/15. Drain pulled 10/15.  PAF: On presentation.  No further AF; Maintaining NSR at 66 History of complete heart block, status post leadless permanent pacemaker.  Currently she is on metoprolol tartrate 25 mg twice a day plans for consolidation to succinate prior to discharge. End-stage renal disease.  For hemodialyaia today in HD unit Chronic anemia.  Most recent hemoglobin 9.0, hematocrit 27.2.  MCV is increased at 102.  B12 1270  Folate 8.2.  AV fistula infiltration from dialysis, good thrill and bruit  Will transfer out of 2 H today to 6e telemetry. Aim for DC tomorrow if remains stable. Maintaining sinus rhythm; not on anticoagulation with recent bloody pericardial effusion.  For questions or updates, please contact [facility] Please consult [email or web address] for contact info under        Signed, Nicki Gu[place], MD  04/18/2023, 9:01 AM

## 2023-04-18 NOTE — Progress Notes (Signed)
Lab called, pericardial fluid sample too viscous to analyze LD and protein levels. Will pass on to day team.

## 2023-04-18 NOTE — Progress Notes (Signed)
Pt transferred to HD via bed on monitor with RN transport. Hand off to HD LPN no signs symptoms of distress at this time.

## 2023-04-19 ENCOUNTER — Inpatient Hospital Stay (HOSPITAL_COMMUNITY): Payer: 59

## 2023-04-19 DIAGNOSIS — R109 Unspecified abdominal pain: Secondary | ICD-10-CM

## 2023-04-19 DIAGNOSIS — N186 End stage renal disease: Secondary | ICD-10-CM | POA: Diagnosis not present

## 2023-04-19 DIAGNOSIS — I3139 Other pericardial effusion (noninflammatory): Secondary | ICD-10-CM | POA: Diagnosis not present

## 2023-04-19 DIAGNOSIS — Z9889 Other specified postprocedural states: Secondary | ICD-10-CM | POA: Diagnosis not present

## 2023-04-19 DIAGNOSIS — I4891 Unspecified atrial fibrillation: Secondary | ICD-10-CM | POA: Diagnosis not present

## 2023-04-19 LAB — BASIC METABOLIC PANEL
Anion gap: 11 (ref 5–15)
Anion gap: 14 (ref 5–15)
BUN: 16 mg/dL (ref 8–23)
BUN: 22 mg/dL (ref 8–23)
CO2: 29 mmol/L (ref 22–32)
CO2: 25 mmol/L (ref 22–32)
Calcium: 8.7 mg/dL — ABNORMAL LOW (ref 8.9–10.3)
Calcium: 9.1 mg/dL (ref 8.9–10.3)
Chloride: 92 mmol/L — ABNORMAL LOW (ref 98–111)
Chloride: 93 mmol/L — ABNORMAL LOW (ref 98–111)
Creatinine, Ser: 4.65 mg/dL — ABNORMAL HIGH (ref 0.44–1.00)
Creatinine, Ser: 5.39 mg/dL — ABNORMAL HIGH (ref 0.44–1.00)
GFR, Estimated: 10 mL/min — ABNORMAL LOW (ref >60)
GFR, Estimated: 8 mL/min — ABNORMAL LOW (ref >60)
Glucose, Bld: 98 mg/dL (ref 70–99)
Glucose, Bld: 127 mg/dL — ABNORMAL HIGH (ref 70–99)
Potassium: 4.1 mmol/L (ref 3.5–5.1)
Potassium: 4 mmol/L (ref 3.5–5.1)
Sodium: 132 mmol/L — ABNORMAL LOW (ref 135–145)
Sodium: 132 mmol/L — ABNORMAL LOW (ref 135–145)

## 2023-04-19 LAB — CBC
HCT: 24.1 % — ABNORMAL LOW (ref 36.0–46.0)
Hemoglobin: 7.9 g/dL — ABNORMAL LOW (ref 12.0–15.0)
MCH: 33.6 pg (ref 26.0–34.0)
MCHC: 32.8 g/dL (ref 30.0–36.0)
MCV: 102.6 fL — ABNORMAL HIGH (ref 80.0–100.0)
Platelets: 127 10*3/uL — ABNORMAL LOW (ref 150–400)
RBC: 2.35 MIL/uL — ABNORMAL LOW (ref 3.87–5.11)
RDW: 15.2 % (ref 11.5–15.5)
WBC: 4.8 10*3/uL (ref 4.0–10.5)
nRBC: 0 % (ref 0.0–0.2)

## 2023-04-19 LAB — GLUCOSE, CAPILLARY
Glucose-Capillary: 100 mg/dL — ABNORMAL HIGH (ref 70–99)
Glucose-Capillary: 114 mg/dL — ABNORMAL HIGH (ref 70–99)

## 2023-04-19 MED ORDER — METOPROLOL SUCCINATE ER 50 MG PO TB24: 50.0000 mg | ORAL_TABLET | Freq: Every day | ORAL | 0 refills | Status: DC

## 2023-04-19 NOTE — Progress Notes (Signed)
[facility] Progress Note  Subjective: seen in room, c/o L flank pain and Xrays pending per cardiology.  Tolerated HD fine yesterday - UF 2L, no issues with AVF. Likely d/c today  Vitals:   04/19/23 0400 04/19/23 0657 04/19/23 0700 04/19/23 0804  BP:   (!) 103/57 107/64  Pulse: 75  78 78  Resp: 16   16  Temp:   98.1 F (36.7 C) 98.1 F (36.7 C)  TempSrc:   Oral Oral  SpO2: 100%  99% 98%  Weight:  59.1 kg    Height:  5\' 1"  (1.549 m)      Exam: Gen alert, no distress, on RA No jvd or bruits Chest clear bilaterally RRR no RG Abd soft ntnd no mass or ascites +bs Ext no LE or UE edema Neuro is alert, Ox 3 , nf    LUA AVF+bruit  - ecchymosis, no erythema, fluctuance or drainage      Renal-related home meds: - norvasc 10  - hydralazine 50 bid - renvela 2 ac - losartan 100 every day - dialyvite every day    OP HD: SW MWF 3.5h  400/1.5   61.5kg   2/2 bath  AVF   Heparin none - last OP HD 10/9, post wt 65.5kg - comes 1-2kg over usually  - good compliance - mircera 75 q 2, last 10/7, due 10/21  - venofer 100 three times per week thru 10/28 - hectorol 2 ug10/7    CXR 10/09 - vasc congestion, CM   Assessment/ Plan: Pericardial effusion - w/ suspected tamponade by echo. Underwent pericardiocentesis 10/10 w/ 400 cc fluid removal followed by drain placement which was removed 10/15.  Plans for outpt f/u.  Afib w/ RVR - now in NSR on BB.   Anticoagulation on hold due to #1.  ESRD - on HD MWF. Last HD wed, next tomorrow. HTN - BP's low-normal, on metoprolol for a fib. Continue.  Volume - improved - appearing euvolemic.   Below EDW at this point.  UF has been ~2L/tx recently. 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 Next esa due 10/21. Follow.  MBD ckd - CCa in range, phos a bit high. Cont IV vdra and renvela as binder. AVF infiltration - occurred last week - good thrill and bruit.  Tolerating cannulation fine since.  9.  Flank pain - thought to be rib related, X rays pending  per primary.   OK for d/c from nephrology perspective.  If remains in will do her HD inpt tomorrow.    Estill Bakes MD [facility] Pager [phone]   Recent Labs  Lab 04/18/23 1059 04/18/23 1251 04/19/23 0222  HGB  --  7.7* 7.9*  ALBUMIN 3.0*  --   --   CALCIUM 9.0  --  8.7*  CREATININE 6.45*  --  4.65*  K 4.2  --  4.1   No results for input(s): "IRON", "TIBC", "FERRITIN" in the last 168 hours. Inpatient medications:  Chlorhexidine Gluconate Cloth  6 each Topical Daily   doxercalciferol  2 mcg Intravenous Q T,Th,Sa-HD   insulin aspart  0-6 Units Subcutaneous TID WC   metoprolol succinate  50 mg Oral Daily   metoprolol tartrate  25 mg Oral BID   senna-docusate  1 tablet Oral QHS   sevelamer carbonate  1,600 mg Oral TID WC     acetaminophen **OR** acetaminophen, albuterol, diphenhydrAMINE, guaiFENesin, HYDROcodone-acetaminophen, melatonin, ondansetron **OR** ondansetron (ZOFRAN) IV, mouth rinse, polyethylene glycol, sorbitol

## 2023-04-19 NOTE — Progress Notes (Signed)
Physical Therapy Treatment Patient Details Name: Stacey Chambers MRN: [id number] DOB: 1951-01-25 Today's Date: 04/19/2023   History of Present Illness Pt is 72 year old presented to [facility] on  04/11/23 for new onset afib and pericardial effusion. Underwent pericardicentesis on 10/10.  Noted decreased output and echo positive for mod effusion w/dilated IVC, had drain readjustment and output improved with IVC size normal. PMH - pacer, esrd on HD, HTN, DM, alcoholic cirrhosis.    PT Comments  Pt tolerated increased ambulation distance today but this was followed by 10/10 back pain which she reports happens at home with increased ambulation distance as well.  Recommend youth height rollator so she can sit before pain peaks (she reports she has had a rollator but the seat is too high for her to  get on). She would also benefit from a shower chair as the 3-in-1 does not fit into her tub. Tried different positions with pt in supine to ease back pain but sitting upright and pain meds were the only thing that helped. PT will continue to follow.    If plan is discharge home, recommend the following: A little help with walking and/or transfers;A little help with bathing/dressing/bathroom;Help with stairs or ramp for entrance;Assist for transportation   Can travel by private vehicle        Equipment Recommendations  Rollator (4 wheels);Other (comment) (youth height rollator and shower seat (3-in-1 does not fit in tub))    Recommendations for Other Services       Precautions / Restrictions Precautions Precautions: Fall Precaution Comments: pericardial drain- hemovac Restrictions Weight Bearing Restrictions: No     Mobility  Bed Mobility               General bed mobility comments: pt in chair    Transfers Overall transfer level: Needs assistance Equipment used: Rolling walker (2 wheels) Transfers: Sit to/from Stand Sit to Stand: Supervision           General transfer  comment: no lines today, pt steady to and from chair    Ambulation/Gait Ambulation/Gait assistance: Supervision Gait Distance (Feet): 350 Feet Assistive device: Rolling walker (2 wheels) Gait Pattern/deviations: Step-through pattern, Decreased stride length, Trunk flexed Gait velocity: decreased Gait velocity interpretation: >2.62 ft/sec, indicative of community ambulatory   General Gait Details: pt with good walker management, pt with c/o BLE shaking at 250' and then back pain beginning at 300'. Took 5 mins in sitting for back pain to ease off. Pt would benefit from rollator that is correct height for her (hers is too tall to sit on)   Stairs             Wheelchair Mobility     Tilt Bed    Modified Rankin (Stroke Patients Only)       Balance Overall balance assessment: Needs assistance   Sitting balance-Leahy Scale: Good     Standing balance support: No upper extremity supported Standing balance-Leahy Scale: Fair Standing balance comment: 5-10' in room without RW                            Cognition Arousal: Alert Behavior During Therapy: WFL for tasks assessed/performed Overall Cognitive Status: Within Functional Limits for tasks assessed                                 General Comments: pt tearful with back pain  after ambulation, reports back pain starts with >5 mins of ambulation at home        Exercises      General Comments General comments (skin integrity, edema, etc.): tried different positions to help ease pt's back pain but nothing helped and pt 10/10 before receiving pain meds.      Pertinent Vitals/Pain Pain Assessment Pain Assessment: 0-10 Pain Score: 10-Worst pain ever Pain Location: back Pain Descriptors / Indicators: Cramping, Crying Pain Intervention(s): Patient requesting pain meds-RN notified, RN gave pain meds during session, Monitored during session    Home Living                           Prior Function            PT Goals (current goals can now be found in the care plan section) Acute Rehab PT Goals Patient Stated Goal: return to independent PT Goal Formulation: With patient Time For Goal Achievement: 04/29/23 Potential to Achieve Goals: Good Progress towards PT goals: Progressing toward goals    Frequency    Min 1X/week      PT Plan      Co-evaluation              AM-PAC PT "6 Clicks" Mobility   Outcome Measure  Help needed turning from your back to your side while in a flat bed without using bedrails?: A Little Help needed moving from lying on your back to sitting on the side of a flat bed without using bedrails?: A Little Help needed moving to and from a bed to a chair (including a wheelchair)?: A Little Help needed standing up from a chair using your arms (e.g., wheelchair or bedside chair)?: A Little Help needed to walk in hospital room?: A Little Help needed climbing 3-5 steps with a railing? : Total 6 Click Score: 16    End of Session Equipment Utilized During Treatment: Gait belt Activity Tolerance: Patient limited by pain Patient left: with call bell/phone within reach;in chair Nurse Communication: Mobility status PT Visit Diagnosis: Muscle weakness (generalized) (M62.81)     Time: 7564-3329 PT Time Calculation (min) (ACUTE ONLY): 23 min  Charges:    $Gait Training: 23-37 mins PT General Charges $$ ACUTE PT VISIT: 1 Visit                     Lyanne Co, PT  Acute Rehab Services Secure chat preferred Office [phone]    Benetta Spar L Harvel Meskill 04/19/2023, 2:00 PM

## 2023-04-19 NOTE — Progress Notes (Signed)
Rounding Note    Patient Name: Stacey Chambers Date of Encounter: 04/19/2023  [facility] Cardiologist: Maisie Fus, MD   Subjective   C/o left flank tenderness; had hemodialysis yesterday.  Inpatient Medications    Scheduled Meds:  Chlorhexidine Gluconate Cloth  6 each Topical Daily   doxercalciferol  2 mcg Intravenous Q T,Th,Sa-HD   insulin aspart  0-6 Units Subcutaneous TID WC   metoprolol succinate  50 mg Oral Daily   metoprolol tartrate  25 mg Oral BID   senna-docusate  1 tablet Oral QHS   sevelamer carbonate  1,600 mg Oral TID WC   Continuous Infusions:   PRN Meds: acetaminophen **OR** acetaminophen, albuterol, diphenhydrAMINE, guaiFENesin, HYDROcodone-acetaminophen, melatonin, ondansetron **OR** ondansetron (ZOFRAN) IV, mouth rinse, polyethylene glycol, sorbitol   Vital Signs    Vitals:   04/19/23 0400 04/19/23 0657 04/19/23 0700 04/19/23 0804  BP:   (!) 103/57 107/64  Pulse: 75  78 78  Resp: 16   16  Temp:   98.1 F (36.7 C)   TempSrc:   Oral Oral  SpO2: 100%  99% 98%  Weight:  59.1 kg    Height:  5\' 1"  (1.549 m)      Intake/Output Summary (Last 24 hours) at 04/19/2023 0812 Last data filed at 04/18/2023 2000 Gross per 24 hour  Intake 120 ml  Output 2000 ml  Net -1880 ml   I/O since admission: - 6464      04/19/2023    6:57 AM 04/18/2023    4:17 PM 04/18/2023   12:36 PM  Last 3 Weights  Weight (lbs) 130 lb 4.8 oz 130 lb 11.7 oz 135 lb 2.3 oz  Weight (kg) 59.104 kg 59.3 kg 61.3 kg      Telemetry    Sinus at 75    - Personally Reviewed  ECG    04/12/2023 ECG - Sinus at 68, RBBB, Personally Reviewed  Physical Exam   BP 107/64 (BP Location: Right Arm)   Pulse 78   Temp 98.1 F (36.7 C) (Oral)   Resp 16   Ht 5\' 1"  (1.549 m)   Wt 59.1 kg   SpO2 98%   BMI 24.62 kg/m  General: Alert, oriented, no distress.  Skin: normal turgor, no rashes, warm and dry HEENT: Normocephalic, atraumatic. Pupils equal round and  reactive to light; sclera anicteric; extraocular muscles intact;  Nose without nasal septal hypertrophy Mouth/Parynx benign; Mallinpatti scale 3 Neck: No JVD, no carotid bruits; normal carotid upstroke Lungs: clear to ausculatation and percussion; no wheezing or rales Chest wall: without tenderness to palpitation Heart: PMI not displaced, RRR, s1 s2 normal, 1/6 systolic murmur, no diastolic murmur, no rubs, gallops, thrills, or heaves Abdomen: soft, nontender; no hepatosplenomehaly, BS+; abdominal aorta nontender and not dilated by palpation. Site of prior pericardial drain stable Back: tenderness over left flank ribs with ? Palpable small knnot Pulses 2+ Musculoskeletal: full range of motion, normal strength, no joint deformities Extremities: L upper extremity ecchymosis at AV fistula site from infiltration at prior dialysis,  no clubbing cyanosis or edema, Homan's sign negative  Neurologic: grossly nonfocal; Cranial nerves grossly wnl Psychologic: Normal mood and affect     Labs    High Sensitivity Troponin:   Recent Labs  Lab 04/11/23 0951 04/11/23 1200  TROPONINIHS 9 11     Chemistry Recent Labs  Lab 04/16/23 0308 04/17/23 0248 04/18/23 0227 04/18/23 1059 04/19/23 0222  NA 133* 134* 132* 132* 132*  K 4.4 4.1 4.0  4.2 4.1  CL 93* 94* 94* 93* 92*  CO2 27 27 26 25 29   GLUCOSE 108* 96 123* 92 98  BUN 33* 16 27* 31* 16  CREATININE 6.72* 4.20* 5.76* 6.45* 4.65*  CALCIUM 8.8* 9.0 8.6* 9.0 8.7*  MG 2.6* 2.3 2.5*  --   --   PROT  --   --   --  7.2  --   ALBUMIN  --   --   --  3.0*  --   AST  --   --   --  28  --   ALT  --   --   --  18  --   ALKPHOS  --   --   --  87  --   BILITOT  --   --   --  0.7  --   GFRNONAA 6* 11* 7* 7* 10*  ANIONGAP 13 13 12 14 11     Lipids No results for input(s): "CHOL", "TRIG", "HDL", "LABVLDL", "LDLCALC", "CHOLHDL" in the last 168 hours.  Hematology Recent Labs  Lab 04/16/23 1002 04/18/23 1251 04/19/23 0222  WBC 5.9 5.5 4.8  RBC 2.52*  2.28* 2.35*  HGB 8.8* 7.7* 7.9*  HCT 26.6* 23.8* 24.1*  MCV 105.6* 104.4* 102.6*  MCH 34.9* 33.8 33.6  MCHC 33.1 32.4 32.8  RDW 15.7* 15.3 15.2  PLT 146* 124* 127*   Thyroid No results for input(s): "TSH", "FREET4" in the last 168 hours.  BNPNo results for input(s): "BNP", "PROBNP" in the last 168 hours.  DDimer No results for input(s): "DDIMER" in the last 168 hours.   Radiology    ECHOCARDIOGRAM LIMITED  Result Date: 04/17/2023    ECHOCARDIOGRAM LIMITED REPORT   Patient Name:   Stacey Chambers Date of Exam: 04/17/2023 Medical Rec #:  [id number]             Height:       61.0 in Accession #:    [id number]            Weight:       132.3 lb Date of Birth:  08-26-1950            BSA:          1.584 m Patient Age:    23 years              BP:           111/51 mmHg Patient Gender: F                     HR:           51 bpm. Exam Location:  Inpatient Procedure: Limited Echo, Cardiac Doppler and Color Doppler Indications:    I31.3 Pericardial effusion (noninflammatory)  History:        Patient has prior history of Echocardiogram examinations, most                 recent 04/15/2023. Pericardial Disease, Abnormal ECG and                 Pacemaker, Arrythmias:Atrial Fibrillation and Bradycardia; Risk                 Factors:Hypertension and Diabetes. ESRD. SHock. Pericardial                 effusion.  Sonographer:    Sheralyn Boatman RDCS Referring Phys: [id number] Dawaun Brancato A Traven Davids IMPRESSIONS  1. Limted echo: Small to moderate pericardial effusion. The  pericardial effusion is circumferential. There is no evidence of cardiac tamponade. Moderate pleural effusion in the left lateral region.  2. Left ventricular ejection fraction, by estimation, is 60 to 65%. The left ventricle has normal function. The left ventricle has no regional wall motion abnormalities. There is mild concentric left ventricular hypertrophy.  3. Right ventricular systolic function is normal. The right ventricular size is normal.  4. The aortic  valve was not well visualized. Aortic valve regurgitation is not visualized. Aortic valve sclerosis is present, with no evidence of aortic valve stenosis.  5. The inferior vena cava is normal in size with <50% respiratory variability, suggesting right atrial pressure of 8 mmHg. Comparison(s): Prior images reviewed side by side. Pericardial effusion size has slightly decreased. FINDINGS  Left Ventricle: Left ventricular ejection fraction, by estimation, is 60 to 65%. The left ventricle has normal function. The left ventricle has no regional wall motion abnormalities. The left ventricular internal cavity size was normal in size. There is  mild concentric left ventricular hypertrophy. Right Ventricle: The right ventricular size is normal. Right ventricular systolic function is normal. Pericardium: A moderately sized pericardial effusion is present. The pericardial effusion is circumferential. There is no evidence of cardiac tamponade. Aortic Valve: The aortic valve was not well visualized. Aortic valve regurgitation is not visualized. Aortic valve sclerosis is present, with no evidence of aortic valve stenosis. Venous: The inferior vena cava is normal in size with less than 50% respiratory variability, suggesting right atrial pressure of 8 mmHg. Additional Comments: There is a moderate pleural effusion in the left lateral region. Spectral Doppler performed. Color Doppler performed.  LEFT VENTRICLE PLAX 2D LVIDd:         4.40 cm LVIDs:         2.70 cm LV PW:         1.00 cm LV IVS:        1.10 cm  IVC IVC diam: 1.80 cm Riley Lam MD Electronically signed by Riley Lam MD Signature Date/Time: 04/17/2023/10:02:58 AM    Final     Cardiac Studies   Pericardiocentesis: 04/12/2023 1.  Successful pericardial drain placement with decrease in pericardial pressure from 28 mmHg to 11 mmHg after evacuation of 400 cc of dark bloody fluid.  The drain was secured in place and the patient was transported 2H  unit for further evaluation and treatment.  ECHO: 04/12/2023  1. Left ventricular ejection fraction, by estimation, is 60 to 65%. The  left ventricle has normal function. The left ventricle has no regional  wall motion abnormalities. Left ventricular diastolic parameters are  consistent with Grade I diastolic  dysfunction (impaired relaxation).   2. Right ventricular systolic function is normal. The right ventricular  size is normal. There is normal pulmonary artery systolic pressure.   3. Pericardial effusion measures up to 3.2 cm. Comapred with the echo  09/2022, pericardial effusion is much larger. Echo findings of diastolic RA  and RV collapse as well as >25% respiratory inflow varation are suggestive  of cardiac tamponade. The IVC is  not dilated, but doesn't collapse, indicating RA pressure 8 mmHg. Suggest  clinical correlation as patient has several features suggesting that she  is at risk for tamponade. Large pericardial effusion. The pericardial  effusion is circumferential.   4. The mitral valve is normal in structure. Mild mitral valve  regurgitation. No evidence of mitral stenosis.   5. The aortic valve was not well visualized. There is mild calcification  of the aortic valve.  There is mild thickening of the aortic valve. Aortic  valve regurgitation is not visualized. No aortic stenosis is present.   6. The inferior vena cava is normal in size with <50% respiratory  variability, suggesting right atrial pressure of 8 mmHg.    1. LIMITED ECHO: 04/13/2023 - See report 04/12/2023 for details.   2. Left ventricular ejection fraction, by estimation, is 60 to 65%. The  left ventricle has normal function.   3. Moderate pericardial effusion. The pericardial effusion is  circumferential. There is no evidence of cardiac tamponade.   4. The inferior vena cava is normal in size with <50% respiratory  variability, suggesting right atrial pressure of 8 mmHg.   Comparison(s): A prior  study was performed on 04/12/23. Reported large  pericardial effusion with hemodynamic features suggestive of tamponade.     LIMITED ECHO: 04/14/2023  1. Left ventricular ejection fraction, by estimation, is 60 to 65%. The  left ventricle has normal function.   2. Right ventricular systolic function is normal. The right ventricular  size is normal.   3. Moderate pericardial effusion. Large pleural effusion.   Comparison(s): No significant change from prior study.    04/16/2023  1. Limted echo: Small to moderate pericardial effusion. The pericardial  effusion is circumferential. There is no evidence of cardiac tamponade.  Moderate pleural effusion in the left lateral region.   2. Left ventricular ejection fraction, by estimation, is 60 to 65%. The  left ventricle has normal function. The left ventricle has no regional  wall motion abnormalities. There is mild concentric left ventricular  hypertrophy.   3. Right ventricular systolic function is normal. The right ventricular  size is normal.   4. The aortic valve was not well visualized. Aortic valve regurgitation  is not visualized. Aortic valve sclerosis is present, with no evidence of  aortic valve stenosis.   5. The inferior vena cava is normal in size with <50% respiratory  variability, suggesting right atrial pressure of 8 mmHg.   Comparison(s): Prior images reviewed side by side. Pericardial effusion  size has slightly decreased.    Patient Profile     72 y.o. female  with a hx of ESRD on hemodialysis, alcohol cirrhosis, hypertension, DM, anemia, complete heart block s/p leadless PPM who is being seen 04/11/2023 for the evaluation of paroxysmal afib at the request of Dr. Posey Rea and Evlyn Kanner PA-C.   Assessment & Plan    Large pericardial effusion effusion with initial tamponade, patient underwent thoracentesis on April 12, 2023 with removal of 400 cc of dark bloody fluid with improvement in pericardial pressure from  28 to 11 mmHg.  Patient has had continued drainage. Subsequent limited echo continued to show residual moderate effusion. Drain was manipulated on 10/12 with improved output.  Total pericardial fluid removed 04/15/23 450 cc and patient had an additional 70 cc removal overnight.  10/14 drainage continued but decreased to 30cc  and 14 cc 10/15. Drain pulled 10/15.   No SOB, stable BP.  Consider f/u echo as outpatient PAF: On presentation.  No further AF; Maintaining NSR  in 60 - 70; with recent very short lived PAF in setting of cardiogenic tamponade and bloody pericardial fluid, and anemia will hold off on resuming anticoagulation. History of complete heart block, status post leadless permanent pacemaker.  Currently she is on metoprolol tartrate 25 mg twice a day plans for consolidation to succinate prior to discharge. End-stage renal disease.  For hemodialyaia today in HD unit Chronic anemia.  Most recent hemoglobin 9.0, hematocrit 27.2.  MCV is increased at 102.  B12 1270  Folate 8.2.  Hb decreased to 7.7/ HCT 23.8. yesterday, today H? 7.9/24.1 AV fistula infiltration from dialysis, good thrill and bruit L flank tenderness over rib; will check PA/lat CXR  Pt now on hospitalist service.     For questions or updates, please contact [facility] Please consult [email or web address] for contact info under        Signed, Nicki Guadalajara, MD  04/19/2023, 8:12 AM

## 2023-04-19 NOTE — Discharge Summary (Signed)
Height:       61.0 in Accession #:    [id number]            Weight:       136.2 lb Date of Birth:  [birth year]            BSA:          1.604 m Patient Age:    71 years              BP:           102/53 mmHg Patient Gender: F                     HR:           82 bpm. Exam Location:  Inpatient Procedure: Limited Echo Indications:    I31.3 Pericardial effusion  History:        Patient has prior history of Echocardiogram examinations, most                 recent 04/14/2023. Arrythmias:Atrial Fibrillation; Risk                 Factors:Hypertension, Diabetes and ESRD.  Sonographer:    Irving Burton Senior RDCS Referring Phys: [id number] Eugenio Hoes  Sonographer Comments: Limited to check effusion for possible window, Dr. Leafy Ro at bedside. IMPRESSIONS  1. LIMITED ECHO: Full echo was done on 04/12/2023.  2. Left ventricular ejection fraction, by estimation, is 60 to 65%. The left ventricle has normal function.  3. Right ventricular systolic function grossly normal.  4. Predominantly small. The pericardial effusion is circumferential and stranding noted in the pericardial space. There is no evidence of cardiac tamponade.  5. The mitral valve is degenerative.  6. The aortic valve was not well visualized.  7. The inferior vena cava is normal in size with <50% respiratory variability, suggesting right atrial pressure of 8 mmHg. Comparison(s): A prior study was performed on 04/14/2023. Size of the pericardial effusion has reduced from moderate to small. IVC is now normal in size. Conclusion(s)/Recommendation(s): Repeat limited echo prior to drain removal. FINDINGS  Left Ventricle: Left ventricular ejection fraction, by estimation, is 60 to 65%. The left ventricle has normal function. Right Ventricle:  Right ventricular systolic function grossly normal. Pericardium: Predominantly small. The pericardial effusion is circumferential and stranding noted in the pericardial space. There is no evidence of cardiac tamponade. Mitral Valve: The mitral valve is degenerative in appearance. Mild mitral annular calcification. Tricuspid Valve: The tricuspid valve is grossly normal. Aortic Valve: The aortic valve was not well visualized. Venous: The inferior vena cava is normal in size with less than 50% respiratory variability, suggesting right atrial pressure of 8 mmHg. [name] Electronically signed by Tessa Lerner Signature Date/Time: 04/15/2023/9:34:27 AM    Final    DG CHEST PORT 1 VIEW  Result Date: 04/14/2023 CLINICAL DATA:  Chest pain EXAM: PORTABLE CHEST 1 VIEW COMPARISON:  04/11/2023 FINDINGS: Gross cardiomegaly with implantable loop recorder. Diffuse interstitial opacity. No acute osseous findings. IMPRESSION: Gross cardiomegaly with diffuse interstitial opacity, consistent with edema or atypical/viral infection. No focal airspace opacity. Electronically Signed   By: Jearld Lesch M.D.   On: 04/14/2023 14:33   ECHOCARDIOGRAM LIMITED  Result Date: 04/14/2023    ECHOCARDIOGRAM LIMITED REPORT   Patient Name:   Stacey Chambers Date of Exam: 04/14/2023 Medical Rec #:  [id number]             Height:  Height:       61.0 in Accession #:    [id number]            Weight:       136.2 lb Date of Birth:  [birth year]            BSA:          1.604 m Patient Age:    71 years              BP:           102/53 mmHg Patient Gender: F                     HR:           82 bpm. Exam Location:  Inpatient Procedure: Limited Echo Indications:    I31.3 Pericardial effusion  History:        Patient has prior history of Echocardiogram examinations, most                 recent 04/14/2023. Arrythmias:Atrial Fibrillation; Risk                 Factors:Hypertension, Diabetes and ESRD.  Sonographer:    Irving Burton Senior RDCS Referring Phys: [id number] Eugenio Hoes  Sonographer Comments: Limited to check effusion for possible window, Dr. Leafy Ro at bedside. IMPRESSIONS  1. LIMITED ECHO: Full echo was done on 04/12/2023.  2. Left ventricular ejection fraction, by estimation, is 60 to 65%. The left ventricle has normal function.  3. Right ventricular systolic function grossly normal.  4. Predominantly small. The pericardial effusion is circumferential and stranding noted in the pericardial space. There is no evidence of cardiac tamponade.  5. The mitral valve is degenerative.  6. The aortic valve was not well visualized.  7. The inferior vena cava is normal in size with <50% respiratory variability, suggesting right atrial pressure of 8 mmHg. Comparison(s): A prior study was performed on 04/14/2023. Size of the pericardial effusion has reduced from moderate to small. IVC is now normal in size. Conclusion(s)/Recommendation(s): Repeat limited echo prior to drain removal. FINDINGS  Left Ventricle: Left ventricular ejection fraction, by estimation, is 60 to 65%. The left ventricle has normal function. Right Ventricle:  Right ventricular systolic function grossly normal. Pericardium: Predominantly small. The pericardial effusion is circumferential and stranding noted in the pericardial space. There is no evidence of cardiac tamponade. Mitral Valve: The mitral valve is degenerative in appearance. Mild mitral annular calcification. Tricuspid Valve: The tricuspid valve is grossly normal. Aortic Valve: The aortic valve was not well visualized. Venous: The inferior vena cava is normal in size with less than 50% respiratory variability, suggesting right atrial pressure of 8 mmHg. [name] Electronically signed by Tessa Lerner Signature Date/Time: 04/15/2023/9:34:27 AM    Final    DG CHEST PORT 1 VIEW  Result Date: 04/14/2023 CLINICAL DATA:  Chest pain EXAM: PORTABLE CHEST 1 VIEW COMPARISON:  04/11/2023 FINDINGS: Gross cardiomegaly with implantable loop recorder. Diffuse interstitial opacity. No acute osseous findings. IMPRESSION: Gross cardiomegaly with diffuse interstitial opacity, consistent with edema or atypical/viral infection. No focal airspace opacity. Electronically Signed   By: Jearld Lesch M.D.   On: 04/14/2023 14:33   ECHOCARDIOGRAM LIMITED  Result Date: 04/14/2023    ECHOCARDIOGRAM LIMITED REPORT   Patient Name:   Stacey Chambers Date of Exam: 04/14/2023 Medical Rec #:  [id number]             Height:  Physician Discharge Summary  Cymone Yeske UEA:[id number] DOB: 04-26-1951 DOA: 04/11/2023  PCP: Corliss Blacker, MD  Admit date: 04/11/2023 Discharge date: 04/19/2023  Admitted From: Home Disposition: Home  Recommendations for Outpatient Follow-up:  Follow up with PCP in 1-2 weeks Follow-up with cardiology as scheduled  Home Health: None Equipment/Devices: None  Discharge Condition: Stable CODE STATUS: Full Diet recommendation: Low-salt low-fat renal diet  Brief/Interim Summary: Orie Cuttino is an 72 y.o. female past medical history significant for end-stage renal disease on hemodialysis Tuesday Thursday and Saturdays, cirrhosis ,status post pacemaker due to third-degree block, essential hypertension presents to the ED as she was having an increasing heart rate of 130s with dialysis.  Accompanied with shortness of breath and chest pain, sore throat poor appetite and abdominal pain.  Started on IV diltiazem then discontinued and changed to oral metoprolol. 2D echo showed an EF of 50% grade 1 diastolic dysfunction but a significantly larger effusion compared to previous.  Patient admitted as above with worsening pericardial effusion initially requiring pericardiocentesis as well as drain, ultimately removed on 04/17/2023 without any further complications.  Patient's new onset A-fib with RVR resolved after improvement in pericardial effusion, patiently comorbid conditions as below remain stable, volume status downtrending with diuretics in the setting of volume overload, hypertension diabetes ESRD on stable on dialysis.  Patient otherwise stable and agreeable for discharge home.  Discharge Diagnoses:  Principal Problem:   New onset atrial fibrillation (HCC) Active Problems:   Diabetes mellitus type 2 with complications (HCC)   Diabetes mellitus type 2, controlled, without complications (HCC)   End stage renal disease on dialysis [facility])   Hypertension   Pericardial  effusion   Chronic diastolic CHF (congestive heart failure) (HCC)   Rapid atrial fibrillation (HCC)   Paroxysmal atrial fibrillation (HCC)   Status post pericardiocentesis   S/P placement of leadless cardiac pacemaker   Acute left flank pain   New onset atrial fibrillation in a patient with a history of complete heart block with leadless pacemaker, resolved: Continue metoprolol - now in sinus rhythm Holding anticoagulation per cardiology   Large Pericardial effusion: Status post pericardiocentesis on 04/12/2023. CT surgery drain was manipulated on 04/15/2023 large amount of fluid came out repeated echo showed small effusion - CT surgery signed off - drain removed 10/15/ Further management per cardiology.   Chronic diastolic heart failure, improving Acute hypoxic respiratory failure, resolved Last EF of 60% in March 2024 no wall motion abnormality GDMT limited by ESRD.   Essential hypertension: Blood pressure is borderline continue to hold amlodipine, hydralazine and losartan.   Diabetes mellitus type 2 with complications [facility]): Resume home regimen   Chronic thrombocytopenia: Stable, repeat labs with PCP   End stage renal disease on dialysis [facility]) Continue dialysis, follow-up with nephrology as scheduled Palpable thrill LUE AV fistula   Hypokalemia: Per-renal.   Flulike symptoms, resolved: She status post vaccine, respiratory panel is negative Pleural effusion with asymmetric interstitial pulmonary edema and bilateral effusions right greater than left secondary to above   Cirrhosis: Noted.  LFTs unremarkable, albumin 3.2.   Discharge Instructions   Allergies as of 04/19/2023       Reactions   Fish Allergy Itching        Medication List     TAKE these medications    acetaminophen 500 MG tablet Commonly known as: TYLENOL Take 500 mg by mouth every 8 (eight) hours as needed for moderate pain.   albuterol 108 (90 Base) MCG/ACT inhaler Commonly known  as:  VENTOLIN HFA Inhale 1-2 puffs into the lungs every 6 (six) hours as needed for wheezing or shortness of breath.   amLODipine 5 MG tablet Commonly known as: NORVASC Take 1 tablet (5 mg total) by mouth daily. What changed: how much to take   DIALYVITE 800 WITH ZINC 0.8 MG Tabs Take 1 tablet by mouth at bedtime.   guaiFENesin 100 MG/5ML liquid Commonly known as: ROBITUSSIN Take 5 mLs by mouth every 4 (four) hours as needed for cough or to loosen phlegm.   hydrALAZINE 50 MG tablet Commonly known as: APRESOLINE Take 1 tablet (50 mg total) by mouth 3 (three) times daily.   lidocaine-prilocaine cream Commonly known as: EMLA Apply 1 Application topically once.   losartan 100 MG tablet Commonly known as: COZAAR Take 100 mg by mouth daily.   metoprolol succinate 50 MG 24 hr tablet Commonly known as: TOPROL-XL Take 1 tablet (50 mg total) by mouth daily. Take with or immediately following a meal. Start taking on: April 20, 2023   mirtazapine 15 MG tablet Commonly known as: REMERON Take 15 mg by mouth See admin instructions. Take 1 tablet by mouth nightly on non-dialysis days   sevelamer carbonate 800 MG tablet Commonly known as: RENVELA Take 1 tablet (800 mg total) by mouth 3 (three) times daily with meals. What changed: how much to take               Durable Medical Equipment  (From admission, onward)           Start     Ordered   04/17/23 1428  For home use only DME Walker rolling  Once       Comments: Youth size please Height 5'1" weight approx 120 pounds  Question Answer Comment  Walker: With 5 Inch Wheels   Patient needs a walker to treat with the following condition Weakness      04/17/23 1428            Allergies  Allergen Reactions   Fish Allergy Itching    Consultations: Nephrology, cardiology, CT surgery  Procedures/Studies: DG Chest 2 View  Result Date: 04/19/2023 CLINICAL DATA:  Left flank pain, tachycardia, chest pain EXAM: CHEST  - 2 VIEW COMPARISON:  Chest radiograph 04/14/2023 FINDINGS: The heart is enlarged, likely unchanged allowing for differences in technique. The upper mediastinal contours are stable. A loop recorder is again noted. Aeration of the lung bases is improved compared to the prior study with decreased size of the right pleural effusion. Interstitial markings have also decreased. Small bilateral pleural effusions are decreased. There is no pneumothorax. There is no acute osseous abnormality. IMPRESSION: Improved aeration of the lung bases with decreased interstitial markings likely reflecting improved pulmonary edema, and decreased pleural effusions. Electronically Signed   By: Lesia Hausen M.D.   On: 04/19/2023 13:01   ECHOCARDIOGRAM LIMITED  Result Date: 04/17/2023    ECHOCARDIOGRAM LIMITED REPORT   Patient Name:   Stacey Chambers Date of Exam: 04/17/2023 Medical Rec #:  [id number]             Height:       61.0 in Accession #:    [id number]            Weight:       132.3 lb Date of Birth:  1950/12/20            BSA:          1.584 m Patient Age:  years              BP:           117/57 mmHg Patient Gender: F                     HR:           70 bpm. Exam Location:  Inpatient Procedure: Limited Echo Indications:    Pericardial Effusion I31.3  History:         Patient has prior history of Echocardiogram examinations, most                 recent 04/12/2023.  Sonographer:    Harriette Bouillon RDCS Referring Phys: [id number] Orbie Pyo IMPRESSIONS  1. LIMITED ECHO - See report 04/12/2023 for details.  2. Left ventricular ejection fraction, by estimation, is 60 to 65%. The left ventricle has normal function.  3. Moderate pericardial effusion. The pericardial effusion is circumferential. There is no evidence of cardiac tamponade.  4. The inferior vena cava is normal in size with <50% respiratory variability, suggesting right atrial pressure of 8 mmHg. Comparison(s): A prior study was performed on 04/12/23. Reported large pericardial effusion with hemodynamic features suggestive of tamponade. FINDINGS  Left Ventricle: Left ventricular ejection fraction, by estimation, is 60 to 65%. The left ventricle has normal function. Pericardium: A moderately sized pericardial effusion is present. The pericardial effusion is circumferential. There is no evidence of cardiac tamponade. Venous: The inferior vena cava is normal in size with less than 50% respiratory variability, suggesting right atrial pressure of 8 mmHg. [name] Electronically signed by Tessa Lerner Signature Date/Time: 04/13/2023/2:45:43 PM    Final    CARDIAC CATHETERIZATION  Result Date: 04/12/2023 1.  Successful pericardial drain placement with decrease in pericardial pressure from 28 mmHg to 11 mmHg after evacuation of 400 cc of dark bloody fluid.  The drain was secured in place and the patient was transported 2H unit for further evaluation and treatment.   ECHOCARDIOGRAM COMPLETE  Result Date: 04/12/2023    ECHOCARDIOGRAM REPORT   Patient Name:   Janiyha AMAVILIA Oelke Date of Exam: 04/12/2023 Medical Rec #:  [id number]             Height:       61.0 in Accession #:    [id number]            Weight:       134.5 lb Date of Birth:  10-12-1950            BSA:          1.596 m Patient Age:    70 years               BP:           124/77 mmHg Patient Gender: F                     HR:           67 bpm. Exam Location:  Inpatient Procedure: 2D Echo, Cardiac Doppler and Color Doppler Indications:    I31.3 Pericardial effusion (noninflammatory)  History:        Patient has prior history of Echocardiogram examinations, most                 recent 09/16/2022. Risk Factors:Non-Smoker and Hypertension.  Sonographer:    Dondra Prader RVT RCS Referring Phys: [phone] LINDSAY B ROBERTS IMPRESSIONS  1. Left ventricular ejection fraction,  as:  VENTOLIN HFA Inhale 1-2 puffs into the lungs every 6 (six) hours as needed for wheezing or shortness of breath.   amLODipine 5 MG tablet Commonly known as: NORVASC Take 1 tablet (5 mg total) by mouth daily. What changed: how much to take   DIALYVITE 800 WITH ZINC 0.8 MG Tabs Take 1 tablet by mouth at bedtime.   guaiFENesin 100 MG/5ML liquid Commonly known as: ROBITUSSIN Take 5 mLs by mouth every 4 (four) hours as needed for cough or to loosen phlegm.   hydrALAZINE 50 MG tablet Commonly known as: APRESOLINE Take 1 tablet (50 mg total) by mouth 3 (three) times daily.   lidocaine-prilocaine cream Commonly known as: EMLA Apply 1 Application topically once.   losartan 100 MG tablet Commonly known as: COZAAR Take 100 mg by mouth daily.   metoprolol succinate 50 MG 24 hr tablet Commonly known as: TOPROL-XL Take 1 tablet (50 mg total) by mouth daily. Take with or immediately following a meal. Start taking on: April 20, 2023   mirtazapine 15 MG tablet Commonly known as: REMERON Take 15 mg by mouth See admin instructions. Take 1 tablet by mouth nightly on non-dialysis days   sevelamer carbonate 800 MG tablet Commonly known as: RENVELA Take 1 tablet (800 mg total) by mouth 3 (three) times daily with meals. What changed: how much to take               Durable Medical Equipment  (From admission, onward)           Start     Ordered   04/17/23 1428  For home use only DME Walker rolling  Once       Comments: Youth size please Height 5'1" weight approx 120 pounds  Question Answer Comment  Walker: With 5 Inch Wheels   Patient needs a walker to treat with the following condition Weakness      04/17/23 1428            Allergies  Allergen Reactions   Fish Allergy Itching    Consultations: Nephrology, cardiology, CT surgery  Procedures/Studies: DG Chest 2 View  Result Date: 04/19/2023 CLINICAL DATA:  Left flank pain, tachycardia, chest pain EXAM: CHEST  - 2 VIEW COMPARISON:  Chest radiograph 04/14/2023 FINDINGS: The heart is enlarged, likely unchanged allowing for differences in technique. The upper mediastinal contours are stable. A loop recorder is again noted. Aeration of the lung bases is improved compared to the prior study with decreased size of the right pleural effusion. Interstitial markings have also decreased. Small bilateral pleural effusions are decreased. There is no pneumothorax. There is no acute osseous abnormality. IMPRESSION: Improved aeration of the lung bases with decreased interstitial markings likely reflecting improved pulmonary edema, and decreased pleural effusions. Electronically Signed   By: Lesia Hausen M.D.   On: 04/19/2023 13:01   ECHOCARDIOGRAM LIMITED  Result Date: 04/17/2023    ECHOCARDIOGRAM LIMITED REPORT   Patient Name:   Stacey Chambers Date of Exam: 04/17/2023 Medical Rec #:  [id number]             Height:       61.0 in Accession #:    [id number]            Weight:       132.3 lb Date of Birth:  1950/12/20            BSA:          1.584 m Patient Age:  as:  VENTOLIN HFA Inhale 1-2 puffs into the lungs every 6 (six) hours as needed for wheezing or shortness of breath.   amLODipine 5 MG tablet Commonly known as: NORVASC Take 1 tablet (5 mg total) by mouth daily. What changed: how much to take   DIALYVITE 800 WITH ZINC 0.8 MG Tabs Take 1 tablet by mouth at bedtime.   guaiFENesin 100 MG/5ML liquid Commonly known as: ROBITUSSIN Take 5 mLs by mouth every 4 (four) hours as needed for cough or to loosen phlegm.   hydrALAZINE 50 MG tablet Commonly known as: APRESOLINE Take 1 tablet (50 mg total) by mouth 3 (three) times daily.   lidocaine-prilocaine cream Commonly known as: EMLA Apply 1 Application topically once.   losartan 100 MG tablet Commonly known as: COZAAR Take 100 mg by mouth daily.   metoprolol succinate 50 MG 24 hr tablet Commonly known as: TOPROL-XL Take 1 tablet (50 mg total) by mouth daily. Take with or immediately following a meal. Start taking on: April 20, 2023   mirtazapine 15 MG tablet Commonly known as: REMERON Take 15 mg by mouth See admin instructions. Take 1 tablet by mouth nightly on non-dialysis days   sevelamer carbonate 800 MG tablet Commonly known as: RENVELA Take 1 tablet (800 mg total) by mouth 3 (three) times daily with meals. What changed: how much to take               Durable Medical Equipment  (From admission, onward)           Start     Ordered   04/17/23 1428  For home use only DME Walker rolling  Once       Comments: Youth size please Height 5'1" weight approx 120 pounds  Question Answer Comment  Walker: With 5 Inch Wheels   Patient needs a walker to treat with the following condition Weakness      04/17/23 1428            Allergies  Allergen Reactions   Fish Allergy Itching    Consultations: Nephrology, cardiology, CT surgery  Procedures/Studies: DG Chest 2 View  Result Date: 04/19/2023 CLINICAL DATA:  Left flank pain, tachycardia, chest pain EXAM: CHEST  - 2 VIEW COMPARISON:  Chest radiograph 04/14/2023 FINDINGS: The heart is enlarged, likely unchanged allowing for differences in technique. The upper mediastinal contours are stable. A loop recorder is again noted. Aeration of the lung bases is improved compared to the prior study with decreased size of the right pleural effusion. Interstitial markings have also decreased. Small bilateral pleural effusions are decreased. There is no pneumothorax. There is no acute osseous abnormality. IMPRESSION: Improved aeration of the lung bases with decreased interstitial markings likely reflecting improved pulmonary edema, and decreased pleural effusions. Electronically Signed   By: Lesia Hausen M.D.   On: 04/19/2023 13:01   ECHOCARDIOGRAM LIMITED  Result Date: 04/17/2023    ECHOCARDIOGRAM LIMITED REPORT   Patient Name:   Stacey Chambers Date of Exam: 04/17/2023 Medical Rec #:  [id number]             Height:       61.0 in Accession #:    [id number]            Weight:       132.3 lb Date of Birth:  1950/12/20            BSA:          1.584 m Patient Age:  as:  VENTOLIN HFA Inhale 1-2 puffs into the lungs every 6 (six) hours as needed for wheezing or shortness of breath.   amLODipine 5 MG tablet Commonly known as: NORVASC Take 1 tablet (5 mg total) by mouth daily. What changed: how much to take   DIALYVITE 800 WITH ZINC 0.8 MG Tabs Take 1 tablet by mouth at bedtime.   guaiFENesin 100 MG/5ML liquid Commonly known as: ROBITUSSIN Take 5 mLs by mouth every 4 (four) hours as needed for cough or to loosen phlegm.   hydrALAZINE 50 MG tablet Commonly known as: APRESOLINE Take 1 tablet (50 mg total) by mouth 3 (three) times daily.   lidocaine-prilocaine cream Commonly known as: EMLA Apply 1 Application topically once.   losartan 100 MG tablet Commonly known as: COZAAR Take 100 mg by mouth daily.   metoprolol succinate 50 MG 24 hr tablet Commonly known as: TOPROL-XL Take 1 tablet (50 mg total) by mouth daily. Take with or immediately following a meal. Start taking on: April 20, 2023   mirtazapine 15 MG tablet Commonly known as: REMERON Take 15 mg by mouth See admin instructions. Take 1 tablet by mouth nightly on non-dialysis days   sevelamer carbonate 800 MG tablet Commonly known as: RENVELA Take 1 tablet (800 mg total) by mouth 3 (three) times daily with meals. What changed: how much to take               Durable Medical Equipment  (From admission, onward)           Start     Ordered   04/17/23 1428  For home use only DME Walker rolling  Once       Comments: Youth size please Height 5'1" weight approx 120 pounds  Question Answer Comment  Walker: With 5 Inch Wheels   Patient needs a walker to treat with the following condition Weakness      04/17/23 1428            Allergies  Allergen Reactions   Fish Allergy Itching    Consultations: Nephrology, cardiology, CT surgery  Procedures/Studies: DG Chest 2 View  Result Date: 04/19/2023 CLINICAL DATA:  Left flank pain, tachycardia, chest pain EXAM: CHEST  - 2 VIEW COMPARISON:  Chest radiograph 04/14/2023 FINDINGS: The heart is enlarged, likely unchanged allowing for differences in technique. The upper mediastinal contours are stable. A loop recorder is again noted. Aeration of the lung bases is improved compared to the prior study with decreased size of the right pleural effusion. Interstitial markings have also decreased. Small bilateral pleural effusions are decreased. There is no pneumothorax. There is no acute osseous abnormality. IMPRESSION: Improved aeration of the lung bases with decreased interstitial markings likely reflecting improved pulmonary edema, and decreased pleural effusions. Electronically Signed   By: Lesia Hausen M.D.   On: 04/19/2023 13:01   ECHOCARDIOGRAM LIMITED  Result Date: 04/17/2023    ECHOCARDIOGRAM LIMITED REPORT   Patient Name:   Stacey Chambers Date of Exam: 04/17/2023 Medical Rec #:  [id number]             Height:       61.0 in Accession #:    [id number]            Weight:       132.3 lb Date of Birth:  1950/12/20            BSA:          1.584 m Patient Age:  63.0 in Accession #:    [id number]            Weight:       138.2 lb Date of Birth:  Mar 05, 1951            BSA:          1.614 m Patient Age:    71 years              BP:           122/59 mmHg Patient Gender: F                     HR:           88 bpm. Exam Location:  Inpatient Procedure: Limited Echo, Color Doppler and Cardiac Doppler Indications:    Pericaridal Effusion  History:        Patient has prior history of Echocardiogram examinations, most                 recent 04/13/2023. Risk Factors:Diabetes and Hypertension.  Sonographer:    Raeford Razor RDCS Referring Phys: [phone] LINDSAY B ROBERTS  IMPRESSIONS  1. Left ventricular ejection fraction, by estimation, is 60 to 65%. The left ventricle has normal function.  2. Right ventricular systolic function is normal. The right ventricular size is normal.  3. Moderate pericardial effusion. Large pleural effusion. Comparison(s): No significant change from prior study. Conclusion(s)/Recommendation(s): Does have some RV collapse in early diastole. Inflows are borderline. The IVC is collapsing. Not consistent with cardiac tamponade. FINDINGS  Left Ventricle: Left ventricular ejection fraction, by estimation, is 60 to 65%. The left ventricle has normal function. Right Ventricle: The right ventricular size is normal. Right ventricular systolic function is normal. Pericardium: A moderately sized pericardial effusion is present. Additional Comments: There is a large pleural effusion.  LEFT VENTRICLE PLAX 2D LVIDd:         4.40 cm LVIDs:         3.00 cm LV PW:         0.90 cm LV IVS:        0.90 cm  IVC IVC diam: 2.10 cm LEFT ATRIUM             Index        RIGHT ATRIUM          Index LA diam:        4.30 cm 2.66 cm/m   RA Area:     9.92 cm LA Vol (A2C):   67.2 ml 41.63 ml/m  RA Volume:   19.20 ml 11.89 ml/m LA Vol (A4C):   48.8 ml 30.23 ml/m LA Biplane Vol: 61.7 ml 38.22 ml/m   AORTA Ao Root diam: 1.90 cm Carolan Clines Electronically signed by Carolan Clines Signature Date/Time: 04/14/2023/1:41:57 PM    Final    ECHOCARDIOGRAM LIMITED  Result Date: 04/13/2023    ECHOCARDIOGRAM LIMITED REPORT   Patient Name:   Stacey Chambers Date of Exam: 04/13/2023 Medical Rec #:  [id number]             Height:       61.0 in Accession #:    [id number]            Weight:       145.9 lb Date of Birth:  Nov 01, 1950            BSA:          1.652 m Patient Age:    57  Physician Discharge Summary  Cymone Yeske UEA:[id number] DOB: 04-26-1951 DOA: 04/11/2023  PCP: Corliss Blacker, MD  Admit date: 04/11/2023 Discharge date: 04/19/2023  Admitted From: Home Disposition: Home  Recommendations for Outpatient Follow-up:  Follow up with PCP in 1-2 weeks Follow-up with cardiology as scheduled  Home Health: None Equipment/Devices: None  Discharge Condition: Stable CODE STATUS: Full Diet recommendation: Low-salt low-fat renal diet  Brief/Interim Summary: Orie Cuttino is an 72 y.o. female past medical history significant for end-stage renal disease on hemodialysis Tuesday Thursday and Saturdays, cirrhosis ,status post pacemaker due to third-degree block, essential hypertension presents to the ED as she was having an increasing heart rate of 130s with dialysis.  Accompanied with shortness of breath and chest pain, sore throat poor appetite and abdominal pain.  Started on IV diltiazem then discontinued and changed to oral metoprolol. 2D echo showed an EF of 50% grade 1 diastolic dysfunction but a significantly larger effusion compared to previous.  Patient admitted as above with worsening pericardial effusion initially requiring pericardiocentesis as well as drain, ultimately removed on 04/17/2023 without any further complications.  Patient's new onset A-fib with RVR resolved after improvement in pericardial effusion, patiently comorbid conditions as below remain stable, volume status downtrending with diuretics in the setting of volume overload, hypertension diabetes ESRD on stable on dialysis.  Patient otherwise stable and agreeable for discharge home.  Discharge Diagnoses:  Principal Problem:   New onset atrial fibrillation (HCC) Active Problems:   Diabetes mellitus type 2 with complications (HCC)   Diabetes mellitus type 2, controlled, without complications (HCC)   End stage renal disease on dialysis [facility])   Hypertension   Pericardial  effusion   Chronic diastolic CHF (congestive heart failure) (HCC)   Rapid atrial fibrillation (HCC)   Paroxysmal atrial fibrillation (HCC)   Status post pericardiocentesis   S/P placement of leadless cardiac pacemaker   Acute left flank pain   New onset atrial fibrillation in a patient with a history of complete heart block with leadless pacemaker, resolved: Continue metoprolol - now in sinus rhythm Holding anticoagulation per cardiology   Large Pericardial effusion: Status post pericardiocentesis on 04/12/2023. CT surgery drain was manipulated on 04/15/2023 large amount of fluid came out repeated echo showed small effusion - CT surgery signed off - drain removed 10/15/ Further management per cardiology.   Chronic diastolic heart failure, improving Acute hypoxic respiratory failure, resolved Last EF of 60% in March 2024 no wall motion abnormality GDMT limited by ESRD.   Essential hypertension: Blood pressure is borderline continue to hold amlodipine, hydralazine and losartan.   Diabetes mellitus type 2 with complications [facility]): Resume home regimen   Chronic thrombocytopenia: Stable, repeat labs with PCP   End stage renal disease on dialysis [facility]) Continue dialysis, follow-up with nephrology as scheduled Palpable thrill LUE AV fistula   Hypokalemia: Per-renal.   Flulike symptoms, resolved: She status post vaccine, respiratory panel is negative Pleural effusion with asymmetric interstitial pulmonary edema and bilateral effusions right greater than left secondary to above   Cirrhosis: Noted.  LFTs unremarkable, albumin 3.2.   Discharge Instructions   Allergies as of 04/19/2023       Reactions   Fish Allergy Itching        Medication List     TAKE these medications    acetaminophen 500 MG tablet Commonly known as: TYLENOL Take 500 mg by mouth every 8 (eight) hours as needed for moderate pain.   albuterol 108 (90 Base) MCG/ACT inhaler Commonly known  Physician Discharge Summary  Cymone Yeske UEA:[id number] DOB: 04-26-1951 DOA: 04/11/2023  PCP: Corliss Blacker, MD  Admit date: 04/11/2023 Discharge date: 04/19/2023  Admitted From: Home Disposition: Home  Recommendations for Outpatient Follow-up:  Follow up with PCP in 1-2 weeks Follow-up with cardiology as scheduled  Home Health: None Equipment/Devices: None  Discharge Condition: Stable CODE STATUS: Full Diet recommendation: Low-salt low-fat renal diet  Brief/Interim Summary: Orie Cuttino is an 72 y.o. female past medical history significant for end-stage renal disease on hemodialysis Tuesday Thursday and Saturdays, cirrhosis ,status post pacemaker due to third-degree block, essential hypertension presents to the ED as she was having an increasing heart rate of 130s with dialysis.  Accompanied with shortness of breath and chest pain, sore throat poor appetite and abdominal pain.  Started on IV diltiazem then discontinued and changed to oral metoprolol. 2D echo showed an EF of 50% grade 1 diastolic dysfunction but a significantly larger effusion compared to previous.  Patient admitted as above with worsening pericardial effusion initially requiring pericardiocentesis as well as drain, ultimately removed on 04/17/2023 without any further complications.  Patient's new onset A-fib with RVR resolved after improvement in pericardial effusion, patiently comorbid conditions as below remain stable, volume status downtrending with diuretics in the setting of volume overload, hypertension diabetes ESRD on stable on dialysis.  Patient otherwise stable and agreeable for discharge home.  Discharge Diagnoses:  Principal Problem:   New onset atrial fibrillation (HCC) Active Problems:   Diabetes mellitus type 2 with complications (HCC)   Diabetes mellitus type 2, controlled, without complications (HCC)   End stage renal disease on dialysis [facility])   Hypertension   Pericardial  effusion   Chronic diastolic CHF (congestive heart failure) (HCC)   Rapid atrial fibrillation (HCC)   Paroxysmal atrial fibrillation (HCC)   Status post pericardiocentesis   S/P placement of leadless cardiac pacemaker   Acute left flank pain   New onset atrial fibrillation in a patient with a history of complete heart block with leadless pacemaker, resolved: Continue metoprolol - now in sinus rhythm Holding anticoagulation per cardiology   Large Pericardial effusion: Status post pericardiocentesis on 04/12/2023. CT surgery drain was manipulated on 04/15/2023 large amount of fluid came out repeated echo showed small effusion - CT surgery signed off - drain removed 10/15/ Further management per cardiology.   Chronic diastolic heart failure, improving Acute hypoxic respiratory failure, resolved Last EF of 60% in March 2024 no wall motion abnormality GDMT limited by ESRD.   Essential hypertension: Blood pressure is borderline continue to hold amlodipine, hydralazine and losartan.   Diabetes mellitus type 2 with complications [facility]): Resume home regimen   Chronic thrombocytopenia: Stable, repeat labs with PCP   End stage renal disease on dialysis [facility]) Continue dialysis, follow-up with nephrology as scheduled Palpable thrill LUE AV fistula   Hypokalemia: Per-renal.   Flulike symptoms, resolved: She status post vaccine, respiratory panel is negative Pleural effusion with asymmetric interstitial pulmonary edema and bilateral effusions right greater than left secondary to above   Cirrhosis: Noted.  LFTs unremarkable, albumin 3.2.   Discharge Instructions   Allergies as of 04/19/2023       Reactions   Fish Allergy Itching        Medication List     TAKE these medications    acetaminophen 500 MG tablet Commonly known as: TYLENOL Take 500 mg by mouth every 8 (eight) hours as needed for moderate pain.   albuterol 108 (90 Base) MCG/ACT inhaler Commonly known  as:  VENTOLIN HFA Inhale 1-2 puffs into the lungs every 6 (six) hours as needed for wheezing or shortness of breath.   amLODipine 5 MG tablet Commonly known as: NORVASC Take 1 tablet (5 mg total) by mouth daily. What changed: how much to take   DIALYVITE 800 WITH ZINC 0.8 MG Tabs Take 1 tablet by mouth at bedtime.   guaiFENesin 100 MG/5ML liquid Commonly known as: ROBITUSSIN Take 5 mLs by mouth every 4 (four) hours as needed for cough or to loosen phlegm.   hydrALAZINE 50 MG tablet Commonly known as: APRESOLINE Take 1 tablet (50 mg total) by mouth 3 (three) times daily.   lidocaine-prilocaine cream Commonly known as: EMLA Apply 1 Application topically once.   losartan 100 MG tablet Commonly known as: COZAAR Take 100 mg by mouth daily.   metoprolol succinate 50 MG 24 hr tablet Commonly known as: TOPROL-XL Take 1 tablet (50 mg total) by mouth daily. Take with or immediately following a meal. Start taking on: April 20, 2023   mirtazapine 15 MG tablet Commonly known as: REMERON Take 15 mg by mouth See admin instructions. Take 1 tablet by mouth nightly on non-dialysis days   sevelamer carbonate 800 MG tablet Commonly known as: RENVELA Take 1 tablet (800 mg total) by mouth 3 (three) times daily with meals. What changed: how much to take               Durable Medical Equipment  (From admission, onward)           Start     Ordered   04/17/23 1428  For home use only DME Walker rolling  Once       Comments: Youth size please Height 5'1" weight approx 120 pounds  Question Answer Comment  Walker: With 5 Inch Wheels   Patient needs a walker to treat with the following condition Weakness      04/17/23 1428            Allergies  Allergen Reactions   Fish Allergy Itching    Consultations: Nephrology, cardiology, CT surgery  Procedures/Studies: DG Chest 2 View  Result Date: 04/19/2023 CLINICAL DATA:  Left flank pain, tachycardia, chest pain EXAM: CHEST  - 2 VIEW COMPARISON:  Chest radiograph 04/14/2023 FINDINGS: The heart is enlarged, likely unchanged allowing for differences in technique. The upper mediastinal contours are stable. A loop recorder is again noted. Aeration of the lung bases is improved compared to the prior study with decreased size of the right pleural effusion. Interstitial markings have also decreased. Small bilateral pleural effusions are decreased. There is no pneumothorax. There is no acute osseous abnormality. IMPRESSION: Improved aeration of the lung bases with decreased interstitial markings likely reflecting improved pulmonary edema, and decreased pleural effusions. Electronically Signed   By: Lesia Hausen M.D.   On: 04/19/2023 13:01   ECHOCARDIOGRAM LIMITED  Result Date: 04/17/2023    ECHOCARDIOGRAM LIMITED REPORT   Patient Name:   Stacey Chambers Date of Exam: 04/17/2023 Medical Rec #:  [id number]             Height:       61.0 in Accession #:    [id number]            Weight:       132.3 lb Date of Birth:  1950/12/20            BSA:          1.584 m Patient Age:

## 2023-04-19 NOTE — Progress Notes (Addendum)
CSW received consult for patient. CSW reached out to [facility] Renal Navigator to see if he could reach out to patients HD center to confirm transportation information for patient.  Update- CSW was informed by [facility] Renal Navigator that patients HD center informed him that patients transportation expired in September of this year. HD center informed [facility] that patient has telephone number and information needed to call and set transportation back up for HD. CSW called patient using pacific interpreter Hanley Hays [phone]. CSW informed patient of this information. Patient confirmed she does have telephone number and information needed and will give transportation a call now to set up transportation for HD. Patient informed CSW if she is unable to set up HD for tomorrow that she plans on taking a taxi to HD to receive treatment. All questions answered. No further questions reported at this time.

## 2023-04-19 NOTE — Discharge Planning (Signed)
[facility] Patient Discharge Orders - [facility] CLINIC: [facility]  Patient's name: Stacey Chambers Admit/DC Dates: 04/11/2023 - 04/19/23   DISCHARGE DIAGNOSES: Pericardial effusion with tamponade -> s/p pericardiocentesis 10/10 (drain removed 10/15), Cx neg  A-fib with RVR ESRD  HD ORDER CHANGES: Heparin change: no <still none> EDW Change: yes New EDW: 59kg Bath Change: no  ANEMIA MANAGEMENT: Aranesp: Given: no   ESA dose for discharge: mircera 150 mcg IV q 2 weeks, to start on 10/21 IV Iron dose at discharge: per protocol Transfusion: Given: no  BONE/MINERAL MEDICATIONS: Hectorol/Calcitriol change: no Sensipar/Parsabiv change: no  ACCESS INTERVENTION/CHANGE: no   RECENT LABS: Recent Labs  Lab 04/18/23 1059 04/18/23 1251 04/19/23 0222 04/19/23 0918  HGB  --    < > 7.9*  --   NA 132*  --  132* 132*  K 4.2  --  4.1 4.0  CALCIUM 9.0  --  8.7* 9.1  ALBUMIN 3.0*  --   --   --    < > = values in this interval not displayed.    IV ANTIBIOTICS: no Details:  OTHER ANTICOAGULATION: On Coumadin?: no   OTHER/APPTS/LAB ORDERS:  - Pls check K weekly for 1 mo  - Needs cardiology f/u - make sure has arranged  D/C Meds to be reconciled by nurse after every discharge.  Completed By: Ozzie Hoyle, PA-C [facility] Pager [phone]   Reviewed by: MD:______ RN_______

## 2023-04-19 NOTE — Plan of Care (Signed)
Pt ready for discharge

## 2023-04-19 NOTE — Progress Notes (Signed)
Out Patient Arrangements:  Pt is established @ [facility] on their MWF shift.   Andree Elk [facility] [phone]

## 2023-04-19 NOTE — Plan of Care (Signed)
  Problem: Education: Goal: Knowledge of risk factors and measures for prevention of condition will improve Outcome: Progressing   Problem: Coping: Goal: Psychosocial and spiritual needs will be supported Outcome: Progressing   Problem: Respiratory: Goal: Will maintain a patent airway Outcome: Progressing Goal: Complications related to the disease process, condition or treatment will be avoided or minimized Outcome: Progressing   Problem: Activity: Goal: Ability to tolerate increased activity will improve Outcome: Progressing   Problem: Respiratory: Goal: Ability to maintain a clear airway and adequate ventilation will improve Outcome: Progressing   Problem: Role Relationship: Goal: Method of communication will improve Outcome: Progressing   Problem: Education: Goal: Knowledge of cardiac device and self-care will improve Outcome: Progressing Goal: Ability to safely manage health related needs after discharge will improve Outcome: Progressing Goal: Individualized Educational Video(s) Outcome: Progressing   Problem: Cardiac: Goal: Ability to achieve and maintain adequate cardiopulmonary perfusion will improve Outcome: Progressing   Problem: Education: Goal: Ability to describe self-care measures that may prevent or decrease complications (Diabetes Survival Skills Education) will improve Outcome: Progressing Goal: Individualized Educational Video(s) Outcome: Progressing   Problem: Coping: Goal: Ability to adjust to condition or change in health will improve Outcome: Progressing   Problem: Fluid Volume: Goal: Ability to maintain a balanced intake and output will improve Outcome: Progressing   Problem: Health Behavior/Discharge Planning: Goal: Ability to identify and utilize available resources and services will improve Outcome: Progressing Goal: Ability to manage health-related needs will improve Outcome: Progressing   Problem: Metabolic: Goal: Ability to  maintain appropriate glucose levels will improve Outcome: Progressing   Problem: Nutritional: Goal: Maintenance of adequate nutrition will improve Outcome: Progressing Goal: Progress toward achieving an optimal weight will improve Outcome: Progressing   Problem: Skin Integrity: Goal: Risk for impaired skin integrity will decrease Outcome: Progressing   Problem: Tissue Perfusion: Goal: Adequacy of tissue perfusion will improve Outcome: Progressing   Problem: Education: Goal: Knowledge of General Education information will improve Description: Including pain rating scale, medication(s)/side effects and non-pharmacologic comfort measures Outcome: Progressing   Problem: Health Behavior/Discharge Planning: Goal: Ability to manage health-related needs will improve Outcome: Progressing   Problem: Clinical Measurements: Goal: Ability to maintain clinical measurements within normal limits will improve Outcome: Progressing Goal: Will remain free from infection Outcome: Progressing Goal: Diagnostic test results will improve Outcome: Progressing Goal: Respiratory complications will improve Outcome: Progressing Goal: Cardiovascular complication will be avoided Outcome: Progressing   Problem: Activity: Goal: Risk for activity intolerance will decrease Outcome: Progressing   Problem: Nutrition: Goal: Adequate nutrition will be maintained Outcome: Progressing   Problem: Coping: Goal: Level of anxiety will decrease Outcome: Progressing   Problem: Elimination: Goal: Will not experience complications related to bowel motility Outcome: Progressing Goal: Will not experience complications related to urinary retention Outcome: Progressing   Problem: Pain Managment: Goal: General experience of comfort will improve Outcome: Progressing   Problem: Safety: Goal: Ability to remain free from injury will improve Outcome: Progressing   Problem: Skin Integrity: Goal: Risk for  impaired skin integrity will decrease Outcome: Progressing

## 2023-04-19 NOTE — Progress Notes (Signed)
Pt discharged home dc instructions given to pt in spanish by [facility]. Pt to go to dialysis in AM attempting to line up transport with company. States she will take cab if unable to arrange transportation. All questions answered pt satisfied and transported to discharge lounge where her ride is to pick her up at 5:30 pm.

## 2023-04-22 ENCOUNTER — Inpatient Hospital Stay (HOSPITAL_COMMUNITY)
Admission: EM | Admit: 2023-04-22 | Discharge: 2023-04-24 | DRG: 193 | Disposition: A | Discharge status: Home Health | Payer: 59 | Attending: Internal Medicine | Admitting: Internal Medicine

## 2023-04-22 ENCOUNTER — Encounter (HOSPITAL_COMMUNITY): Payer: Self-pay

## 2023-04-22 ENCOUNTER — Emergency Department (HOSPITAL_COMMUNITY): Payer: 59

## 2023-04-22 ENCOUNTER — Other Ambulatory Visit: Payer: Self-pay

## 2023-04-22 DIAGNOSIS — D638 Anemia in other chronic diseases classified elsewhere: Secondary | ICD-10-CM | POA: Diagnosis present

## 2023-04-22 DIAGNOSIS — R0600 Dyspnea, unspecified: Principal | ICD-10-CM

## 2023-04-22 DIAGNOSIS — K7030 Alcoholic cirrhosis of liver without ascites: Secondary | ICD-10-CM | POA: Diagnosis present

## 2023-04-22 DIAGNOSIS — Z950 Presence of cardiac pacemaker: Secondary | ICD-10-CM

## 2023-04-22 DIAGNOSIS — N186 End stage renal disease: Secondary | ICD-10-CM

## 2023-04-22 DIAGNOSIS — D631 Anemia in chronic kidney disease: Secondary | ICD-10-CM | POA: Diagnosis present

## 2023-04-22 DIAGNOSIS — J189 Pneumonia, unspecified organism: Secondary | ICD-10-CM | POA: Diagnosis not present

## 2023-04-22 DIAGNOSIS — R531 Weakness: Secondary | ICD-10-CM

## 2023-04-22 DIAGNOSIS — I5032 Chronic diastolic (congestive) heart failure: Secondary | ICD-10-CM | POA: Diagnosis not present

## 2023-04-22 DIAGNOSIS — H547 Unspecified visual loss: Secondary | ICD-10-CM | POA: Diagnosis present

## 2023-04-22 DIAGNOSIS — Z91013 Allergy to seafood: Secondary | ICD-10-CM

## 2023-04-22 DIAGNOSIS — E871 Hypo-osmolality and hyponatremia: Secondary | ICD-10-CM | POA: Diagnosis present

## 2023-04-22 DIAGNOSIS — I3139 Other pericardial effusion (noninflammatory): Secondary | ICD-10-CM | POA: Diagnosis present

## 2023-04-22 DIAGNOSIS — I480 Paroxysmal atrial fibrillation: Secondary | ICD-10-CM | POA: Diagnosis present

## 2023-04-22 DIAGNOSIS — I10 Essential (primary) hypertension: Secondary | ICD-10-CM | POA: Diagnosis present

## 2023-04-22 DIAGNOSIS — N2581 Secondary hyperparathyroidism of renal origin: Secondary | ICD-10-CM | POA: Diagnosis present

## 2023-04-22 DIAGNOSIS — I132 Hypertensive heart and chronic kidney disease with heart failure and with stage 5 chronic kidney disease, or end stage renal disease: Secondary | ICD-10-CM | POA: Diagnosis present

## 2023-04-22 DIAGNOSIS — E119 Type 2 diabetes mellitus without complications: Secondary | ICD-10-CM

## 2023-04-22 DIAGNOSIS — Z992 Dependence on renal dialysis: Secondary | ICD-10-CM

## 2023-04-22 DIAGNOSIS — Z79899 Other long term (current) drug therapy: Secondary | ICD-10-CM

## 2023-04-22 DIAGNOSIS — E875 Hyperkalemia: Secondary | ICD-10-CM | POA: Diagnosis present

## 2023-04-22 DIAGNOSIS — E1122 Type 2 diabetes mellitus with diabetic chronic kidney disease: Secondary | ICD-10-CM | POA: Diagnosis present

## 2023-04-22 DIAGNOSIS — Z833 Family history of diabetes mellitus: Secondary | ICD-10-CM

## 2023-04-22 HISTORY — DX: Paroxysmal atrial fibrillation: I48.0

## 2023-04-22 LAB — BASIC METABOLIC PANEL
Anion gap: 16 — ABNORMAL HIGH (ref 5–15)
BUN: 33 mg/dL — ABNORMAL HIGH (ref 8–23)
CO2: 25 mmol/L (ref 22–32)
Calcium: 8.5 mg/dL — ABNORMAL LOW (ref 8.9–10.3)
Chloride: 90 mmol/L — ABNORMAL LOW (ref 98–111)
Creatinine, Ser: 5.98 mg/dL — ABNORMAL HIGH (ref 0.44–1.00)
GFR, Estimated: 7 mL/min — ABNORMAL LOW (ref >60)
Glucose, Bld: 109 mg/dL — ABNORMAL HIGH (ref 70–99)
Potassium: 4.3 mmol/L (ref 3.5–5.1)
Sodium: 131 mmol/L — ABNORMAL LOW (ref 135–145)

## 2023-04-22 LAB — CBC
HCT: 24.1 % — ABNORMAL LOW (ref 36.0–46.0)
Hemoglobin: 7.8 g/dL — ABNORMAL LOW (ref 12.0–15.0)
MCH: 33.5 pg (ref 26.0–34.0)
MCHC: 32.4 g/dL (ref 30.0–36.0)
MCV: 103.4 fL — ABNORMAL HIGH (ref 80.0–100.0)
Platelets: 127 10*3/uL — ABNORMAL LOW (ref 150–400)
RBC: 2.33 MIL/uL — ABNORMAL LOW (ref 3.87–5.11)
RDW: 14.6 % (ref 11.5–15.5)
WBC: 5.2 10*3/uL (ref 4.0–10.5)
nRBC: 0 % (ref 0.0–0.2)

## 2023-04-22 LAB — BRAIN NATRIURETIC PEPTIDE: B Natriuretic Peptide: 2066.2 pg/mL — ABNORMAL HIGH (ref 0.0–100.0)

## 2023-04-22 LAB — TROPONIN I (HIGH SENSITIVITY)
Troponin I (High Sensitivity): 12 ng/L (ref <18)
Troponin I (High Sensitivity): 12 ng/L (ref <18)

## 2023-04-22 LAB — MAGNESIUM: Magnesium: 2.6 mg/dL — ABNORMAL HIGH (ref 1.7–2.4)

## 2023-04-22 MED ORDER — MELATONIN 3 MG PO TABS
3.0000 mg | ORAL_TABLET | Freq: Every evening | ORAL | Status: DC | PRN
  Administered 2023-04-23: 3 mg via ORAL
  Filled 2023-04-22: qty 1

## 2023-04-22 MED ORDER — ACETAMINOPHEN 325 MG PO TABS
650.0000 mg | ORAL_TABLET | Freq: Four times a day (QID) | ORAL | Status: DC | PRN
  Administered 2023-04-23: 650 mg via ORAL
  Filled 2023-04-22 (×2): qty 2

## 2023-04-22 MED ORDER — ACETAMINOPHEN 650 MG RE SUPP: 650.0000 mg | Freq: Four times a day (QID) | RECTAL | Status: DC | PRN

## 2023-04-22 MED ORDER — ONDANSETRON HCL 4 MG/2ML IJ SOLN: 4.0000 mg | Freq: Four times a day (QID) | INTRAMUSCULAR | Status: DC | PRN

## 2023-04-22 NOTE — H&P (Incomplete)
History and Physical      Stacey Chambers WUJ:[id number] DOB: Aug 15, 1950 DOA: 04/22/2023; DOS: 04/22/2023  PCP: Corliss Blacker, MD *** Patient coming from: home ***  I have personally briefly reviewed patient's old medical records in [facility]  Chief Complaint: ***  HPI: Stacey Chambers is a 72 y.o. female with medical history significant for *** who is admitted to [facility] on 04/22/2023 with *** after presenting from home*** to [facility] ED complaining of ***.    ***       ***   ED Course:  Vital signs in the ED were notable for the following: ***  Labs were notable for the following: ***  Per my interpretation, EKG in ED demonstrated the following:  ***  Imaging in the ED, per corresponding formal radiology read, was notable for the following:  ***  While in the ED, the following were administered: ***  Subsequently, the patient was admitted  ***  ***red    Review of Systems: As per HPI otherwise 10 point review of systems negative.   Past Medical History:  Diagnosis Date   Anemia    Cirrhosis (HCC)    Diabetes mellitus without complication (HCC)    ESRD (end stage renal disease) on dialysis (HCC)    Hypertension    Type 2 diabetes mellitus (HCC)    Vision loss     Past Surgical History:  Procedure Laterality Date   AV FISTULA PLACEMENT Left 12/23/2021   Procedure: INSERTION OF LEFT ARM BRACHIOCEPHALIC ARTERIOVENOUS FISTULA;  Surgeon: Cephus Shelling, MD;  Location: [facility] OR;  Service: Vascular;  Laterality: Left;   BACK SURGERY     COLONOSCOPY     IR FLUORO GUIDE CV LINE RIGHT  12/16/2021   IR US GUIDE VASC ACCESS RIGHT  12/16/2021   PACEMAKER LEADLESS INSERTION N/A 01/15/2023   Procedure: PACEMAKER LEADLESS INSERTION;  Surgeon: Lanier Prude, MD;  Location: [facility] INVASIVE CV LAB;  Service: Cardiovascular;  Laterality: N/A;   PERICARDIOCENTESIS N/A 04/12/2023   Procedure: PERICARDIOCENTESIS;  Surgeon: Orbie Pyo, MD;  Location: [facility] INVASIVE CV LAB;  Service: Cardiovascular;  Laterality: N/A;   TEMPORARY PACEMAKER N/A 01/13/2023   Procedure: TEMPORARY PACEMAKER;  Surgeon: Orbie Pyo, MD;  Location: [facility] INVASIVE CV LAB;  Service: Cardiovascular;  Laterality: N/A;   UPPER GASTROINTESTINAL ENDOSCOPY      Social History:  reports that she has never smoked. She has never used smokeless tobacco. She reports that she does not currently use drugs after having used the following drugs: Marijuana. She reports that she does not drink alcohol.   Allergies  Allergen Reactions   Fish Allergy Itching    Family History  Problem Relation Age of Onset   Diabetes Brother    Colon cancer Neg Hx    Esophageal cancer Neg Hx    Stomach cancer Neg Hx    Pancreatic cancer Neg Hx    Inflammatory bowel disease Neg Hx    Liver disease Neg Hx    Rectal cancer Neg Hx    Colon polyps Neg Hx     Family history reviewed and not pertinent ***   Prior to Admission medications   Medication Sig Start Date End Date Taking? Authorizing Provider  acetaminophen (TYLENOL) 500 MG tablet Take 500 mg by mouth every 8 (eight) hours as needed for moderate pain.    [provider]  albuterol (VENTOLIN HFA) 108 (90 Base) MCG/ACT inhaler Inhale 1-2 puffs into the  lungs every 6 (six) hours as needed for wheezing or shortness of breath. 12/06/22   [provider]  amLODipine (NORVASC) 5 MG tablet Take 1 tablet (5 mg total) by mouth daily. Patient taking differently: Take 10 mg by mouth daily. 01/18/23   Catarina Hartshorn, MD  B Complex-C-Zn-Folic Acid (DIALYVITE 800 WITH ZINC) 0.8 MG TABS Take 1 tablet by mouth at bedtime. Patient not taking: Reported on 04/12/2023 03/17/23   [provider]  guaiFENesin (ROBITUSSIN) 100 MG/5ML liquid Take 5 mLs by mouth every 4 (four) hours as needed for cough or to loosen phlegm. 10/27/22   Willeen Niece, MD  hydrALAZINE (APRESOLINE) 50 MG tablet Take 1 tablet (50 mg total) by  mouth 3 (three) times daily. 12/19/22 04/12/23  Maisie Fus, MD  lidocaine-prilocaine (EMLA) cream Apply 1 Application topically once. 12/19/22   [provider]  losartan (COZAAR) 100 MG tablet Take 100 mg by mouth daily. Patient not taking: Reported on 04/12/2023 12/06/22   [provider]  metoprolol succinate (TOPROL-XL) 50 MG 24 hr tablet Take 1 tablet (50 mg total) by mouth daily. Take with or immediately following a meal. 04/20/23   Azucena Fallen, MD  mirtazapine (REMERON) 15 MG tablet Take 15 mg by mouth See admin instructions. Take 1 tablet by mouth nightly on non-dialysis days 10/10/22   [provider]  sevelamer carbonate (RENVELA) 800 MG tablet Take 1 tablet (800 mg total) by mouth 3 (three) times daily with meals. Patient taking differently: Take 1,600 mg by mouth 3 (three) times daily with meals. 10/27/22   Willeen Niece, MD     Objective    Physical Exam: Vitals:   04/22/23 1943 04/22/23 1945 04/22/23 2000 04/22/23 2030  BP:  (!) 103/56 (!) 120/57 111/77  Pulse: 70 70 70 73  Resp: 18 10 15 13   Temp:      SpO2:  100% 100% 100%  Weight:      Height:        General: appears to be stated age; alert, oriented Skin: warm, dry, no rash Head:  AT/[place] Mouth:  Oral mucosa membranes appear moist, normal dentition Neck: supple; trachea midline Heart:  RRR; did not appreciate any M/R/G Lungs: CTAB, did not appreciate any wheezes, rales, or rhonchi Abdomen: + BS; soft, ND, NT Vascular: 2+ pedal pulses b/l; 2+ radial pulses b/l Extremities: no peripheral edema, no muscle wasting Neuro: strength and sensation intact in upper and lower extremities b/l ***   *** Neuro: 5/5 strength of the proximal and distal flexors and extensors of the upper and lower extremities bilaterally; sensation intact in upper and lower extremities b/l; cranial nerves II through XII grossly intact; no pronator drift; no evidence suggestive of slurred speech, dysarthria,  or facial droop; Normal muscle tone. No tremors.  *** Neuro: In the setting of the patient's current mental status and associated inability to follow instructions, unable to perform full neurologic exam at this time.  As such, assessment of strength, sensation, and cranial nerves is limited at this time. Patient noted to spontaneously move all 4 extremities. No tremors.  ***    Labs on Admission: I have personally reviewed following labs and imaging studies  CBC: Recent Labs  Lab 04/16/23 1002 04/18/23 1251 04/19/23 0222 04/22/23 1858  WBC 5.9 5.5 4.8 5.2  HGB 8.8* 7.7* 7.9* 7.8*  HCT 26.6* 23.8* 24.1* 24.1*  MCV 105.6* 104.4* 102.6* 103.4*  PLT 146* 124* 127* 127*   Basic Metabolic Panel: Recent Labs  Lab 04/16/23 0308 04/17/23 0248 04/18/23 0227 04/18/23 1059 04/19/23 0222 04/19/23 0918 04/22/23 1858  NA 133* 134* 132* 132* 132* 132* 131*  K 4.4 4.1 4.0 4.2 4.1 4.0 4.3  CL 93* 94* 94* 93* 92* 93* 90*  CO2 27 27 26 25 29 25 25   GLUCOSE 108* 96 123* 92 98 127* 109*  BUN 33* 16 27* 31* 16 22 33*  CREATININE 6.72* 4.20* 5.76* 6.45* 4.65* 5.39* 5.98*  CALCIUM 8.8* 9.0 8.6* 9.0 8.7* 9.1 8.5*  MG 2.6* 2.3 2.5*  --   --   --   --    GFR: Estimated Creatinine Clearance: 7.3 mL/min (A) (by C-G formula based on SCr of 5.98 mg/dL (H)). Liver Function Tests: Recent Labs  Lab 04/18/23 1059  AST 28  ALT 18  ALKPHOS 87  BILITOT 0.7  PROT 7.2  ALBUMIN 3.0*   No results for input(s): "LIPASE", "AMYLASE" in the last 168 hours. No results for input(s): "AMMONIA" in the last 168 hours. Coagulation Profile: No results for input(s): "INR", "PROTIME" in the last 168 hours. Cardiac Enzymes: No results for input(s): "CKTOTAL", "CKMB", "CKMBINDEX", "TROPONINI" in the last 168 hours. BNP (last 3 results) No results for input(s): "PROBNP" in the last 8760 hours. HbA1C: No results for input(s): "HGBA1C" in the last 72 hours. CBG: Recent Labs  Lab 04/18/23 1115 04/18/23 1649  04/18/23 2130 04/19/23 0613 04/19/23 1113  GLUCAP 90 92 111* 100* 114*   Lipid Profile: No results for input(s): "CHOL", "HDL", "LDLCALC", "TRIG", "CHOLHDL", "LDLDIRECT" in the last 72 hours. Thyroid Function Tests: No results for input(s): "TSH", "T4TOTAL", "FREET4", "T3FREE", "THYROIDAB" in the last 72 hours. Anemia Panel: No results for input(s): "VITAMINB12", "FOLATE", "FERRITIN", "TIBC", "IRON", "RETICCTPCT" in the last 72 hours. Urine analysis:    Component Value Date/Time   COLORURINE STRAW (A) 09/16/2022 0730   APPEARANCEUR CLEAR 09/16/2022 0730   LABSPEC 1.006 09/16/2022 0730   PHURINE 9.0 (H) 09/16/2022 0730   GLUCOSEU 50 (A) 09/16/2022 0730   HGBUR MODERATE (A) 09/16/2022 0730   BILIRUBINUR NEGATIVE 09/16/2022 0730   KETONESUR NEGATIVE 09/16/2022 0730   PROTEINUR 100 (A) 09/16/2022 0730   NITRITE NEGATIVE 09/16/2022 0730   LEUKOCYTESUR NEGATIVE 09/16/2022 0730    Radiological Exams on Admission: DG Chest 2 View  Result Date: 04/22/2023 CLINICAL DATA:  Chest pain, dyspnea EXAM: CHEST - 2 VIEW COMPARISON:  04/19/2023 FINDINGS: Mildly progressive bibasilar pulmonary opacities in keeping with atelectasis or infiltrate. No pneumothorax. Stable small right pleural effusion. Cardiac size is stably mildly enlarged. Lead less pacemaker again noted. Pulmonary vascularity is normal. No acute bone abnormality. IMPRESSION: 1. Mildly progressive bibasilar pulmonary opacities in keeping with atelectasis or infiltrate. 2. Stable small right pleural effusion. Electronically Signed   By: Helyn Numbers M.D.   On: 04/22/2023 20:54      Assessment/Plan   Principal Problem:   Pericardial  effusion   ***            ***                ***                 ***               ***               ***               ***                ***               ***               ***               ***               ***              ***          ***  DVT prophylaxis: SCD's ***  Code Status: Full code*** Family Communication: none*** Disposition Plan: Per Rounding Team Consults called: none***;  Admission status: ***     I SPENT GREATER THAN 75 *** MINUTES IN CLINICAL CARE TIME/MEDICAL DECISION-MAKING IN COMPLETING THIS ADMISSION.      Chaney Born Revecca Nachtigal DO [facility]  From 7PM - 7AM   04/22/2023, 9:01 PM   ***

## 2023-04-22 NOTE — ED Triage Notes (Signed)
Patient complains sob and chest pain and heart racing.  Patient appears anxious and nervous.  Patient was just recently dc'd.

## 2023-04-22 NOTE — ED Provider Notes (Signed)
[place] EMERGENCY DEPARTMENT AT [facility] Provider Note   CSN: [id number] Arrival date & time: 04/22/23  7829     History  Chief Complaint  Patient presents with   Chest Pain   Shortness of Breath    Stacey Chambers is a 72 y.o. female.  HPI    72 y.o. 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 Pt was discharged from the hospital on 10-17.  Pt reports that about a week ago she was getting her HD when she started getting shob.  While she was in the hospital, these symptoms were present. She was informed that her active cardiac issues were causing shob, and her symptoms resolved after the treatmen t in the hospital. She went to HD Friday, and since then she has been having shob again.   Shob is described as "hard to breathe" feeling "suffocated". Symptoms are constant, worse with exertion. With exertion she has increased weakness, shob. She denies any near fainting. She has been feeling anxious because of her shob. No CP. No leg swelling or weight gain.   Pt has no hx of PE, DVT and denies any exogenous hormone (testosterone / estrogen) use, long distance travels or surgery in the past 6 weeks, active cancer, recent immobilization.  VIDEO TRANSLATOR USED FOR THE ENCOUNTER.   Home Medications Prior to Admission medications   Medication Sig Start Date End Date Taking? Authorizing Provider  acetaminophen (TYLENOL) 500 MG tablet Take 500 mg by mouth every 8 (eight) hours as needed for moderate pain.   Yes [provider]  albuterol (VENTOLIN HFA) 108 (90 Base) MCG/ACT inhaler Inhale 1-2 puffs into the lungs every 6 (six) hours as needed for wheezing or shortness of breath.  12/06/22  Yes [provider]  doxycycline (VIBRA-TABS) 100 MG tablet Take 1 tablet (100 mg total) by mouth 2 (two) times daily for 5 days. 04/24/23 04/29/23 Yes Gherghe, Daylene Katayama, MD  Glycerin-Hypromellose-PEG 400 (VISINE DRY EYE OP) Apply 1 drop to eye 2 (two) times daily as needed (irritation).   Yes [provider]  ketorolac (ACULAR) 0.5 % ophthalmic solution Place 1 drop into the right eye 4 (four) times daily. 04/11/23  Yes [provider]  lidocaine-prilocaine (EMLA) cream Apply 1 Application topically once. 12/19/22  Yes [provider]  LOKELMA 10 g PACK packet Take 10 g by mouth daily. 04/12/23  Yes [provider]  mirtazapine (REMERON) 15 MG tablet Take 15 mg by mouth See admin instructions. Take 1 tablet by mouth nightly on non-dialysis days 10/10/22  Yes [provider]  sevelamer carbonate (RENVELA) 800 MG tablet Take 1 tablet (800 mg total) by mouth 3 (three) times daily with meals. Patient taking differently: Take 1,600 mg by mouth 3 (three) times daily with meals. 10/27/22  Yes Willeen Niece, MD  amLODipine (NORVASC) 5 MG tablet Take 1 tablet (5 mg total) by mouth daily. 04/24/23   Leatha Gilding, MD  metoprolol succinate (TOPROL-XL) 25 MG 24 hr tablet Take 1 tablet (25 mg total) by mouth daily. 04/25/23   Leatha Gilding, MD      Allergies    Fish allergy    Review of Systems   Review of Systems  All other systems reviewed and are negative.  Physical Exam Updated Vital Signs BP (!) 102/55 (BP Location: Right Arm)   Pulse 73   Temp 97.9 F (36.6 C) (Oral)   Resp 18   Ht 5\' 1"  (1.549 m)   Wt 61.6 kg Comment: this is the patients actual weight 50 kg was a mistake.  SpO2 96%   BMI 25.66 kg/m  Physical Exam Vitals and nursing note reviewed.  Constitutional:      Appearance: She is well-developed.  HENT:     Head: Atraumatic.  Neck:     Vascular: JVD present.  Cardiovascular:     Rate and Rhythm: Normal  rate.     Heart sounds: Heart sounds are distant. Murmur heard.  Pulmonary:     Effort: Pulmonary effort is normal.     Breath sounds: Normal breath sounds. No rales.  Musculoskeletal:     Cervical back: Normal range of motion and neck supple.     Right lower leg: No edema.     Left lower leg: No edema.  Skin:    General: Skin is warm and dry.  Neurological:     Mental Status: She is alert and oriented to person, place, and time.     ED Results / Procedures / Treatments   Labs (all labs ordered are listed, but only abnormal results are displayed) Labs Reviewed  BASIC METABOLIC PANEL - Abnormal; Notable for the following components:      Result Value   Sodium 131 (*)    Chloride 90 (*)    Glucose, Bld 109 (*)    BUN 33 (*)    Creatinine, Ser 5.98 (*)    Calcium 8.5 (*)    GFR, Estimated 7 (*)    Anion gap 16 (*)    All other components within normal limits  CBC - Abnormal; Notable for the following components:   RBC 2.33 (*)    Hemoglobin 7.8 (*)    HCT 24.1 (*)    MCV 103.4 (*)    Platelets 127 (*)    All other components within normal limits  BRAIN NATRIURETIC PEPTIDE - Abnormal; Notable for the following components:   B Natriuretic Peptide 2,066.2 (*)    All other components within normal limits  CBC WITH DIFFERENTIAL/PLATELET - Abnormal; Notable for the following components:   RBC 2.40 (*)    Hemoglobin 7.9 (*)    HCT 24.6 (*)    MCV 102.5 (*)    Platelets 134 (*)    All other components within normal limits  COMPREHENSIVE METABOLIC PANEL - Abnormal; Notable for the following components:   Sodium 131 (*)    Chloride 92 (*)    BUN 36 (*)    Creatinine, Ser 6.67 (*)    Calcium 8.5 (*)    Albumin 3.1 (*)    GFR, Estimated 6 (*)    Anion gap 16 (*)    All other components within normal limits  MAGNESIUM - Abnormal; Notable for the following components:   Magnesium 2.8 (*)    All other components within normal limits  PHOSPHORUS - Abnormal; Notable for the  following components:   Phosphorus 5.6 (*)    All other components within normal limits  MAGNESIUM - Abnormal; Notable for the following components:   Magnesium 2.6 (*)    All other components within normal limits  VITAMIN B12 - Abnormal; Notable for the following components:   Vitamin B-12 1,024 (*)    All other components within normal limits  TSH - Abnormal;  Notable for the following components:   TSH 6.326 (*)    All other components within normal limits  HEMOGLOBIN A1C - Abnormal; Notable for the following components:   Hgb A1c MFr Bld 4.3 (*)    All other components within normal limits  PROTIME-INR - Abnormal; Notable for the following components:   Prothrombin Time 17.5 (*)    INR 1.4 (*)    All other components within normal limits  COMPREHENSIVE METABOLIC PANEL - Abnormal; Notable for the following components:   BUN 37 (*)    Creatinine, Ser 6.25 (*)    Calcium 8.3 (*)    Albumin 3.1 (*)    GFR, Estimated 7 (*)    All other components within normal limits  CBC - Abnormal; Notable for the following components:   WBC 3.9 (*)    RBC 2.41 (*)    Hemoglobin 8.0 (*)    HCT 24.8 (*)    MCV 102.9 (*)    Platelets 128 (*)    All other components within normal limits  MAGNESIUM - Abnormal; Notable for the following components:   Magnesium 2.6 (*)    All other components within normal limits  GLUCOSE, CAPILLARY - Abnormal; Notable for the following components:   Glucose-Capillary 160 (*)    All other components within normal limits  CBG MONITORING, ED - Abnormal; Notable for the following components:   Glucose-Capillary 171 (*)    All other components within normal limits  PROCALCITONIN  HEPATITIS B SURFACE ANTIGEN  HEPATITIS B SURFACE ANTIBODY, QUANTITATIVE  CBG MONITORING, ED  TROPONIN I (HIGH SENSITIVITY)  TROPONIN I (HIGH SENSITIVITY)    EKG EKG Interpretation Date/Time:  Sunday April 22 2023 18:29:59 EDT Ventricular Rate:  85 PR Interval:  158 QRS  Duration:  120 QT Interval:  424 QTC Calculation: 504 R Axis:   82  Text Interpretation: Normal sinus rhythm Right bundle branch block Abnormal ECG When compared with ECG of 19-Apr-2023 08:48, PREVIOUS ECG IS PRESENT lateral TWI is new Confirmed by Derwood Kaplan [phone]) on 04/22/2023 8:18:11 PM  Radiology No results found.  Procedures Procedures    Medications Ordered in ED Medications  diphenhydrAMINE (BENADRYL) capsule 25 mg (25 mg Oral Given by Other 04/23/23 1604)    ED Course/ Medical Decision Making/ A&P                                 Medical Decision Making Amount and/or Complexity of Data Reviewed Labs: ordered. Radiology: ordered.  Risk Decision regarding hospitalization.   This patient presents to the ED with chief complaint(s) of shortness of breath with pertinent past medical history of ESRD, CHF, new PAF and recent admission for large pericardial effusion.The complaint involves an extensive differential diagnosis and also carries with it a high risk of complications and morbidity.    The differential diagnosis considered for this patient includes  ACS syndrome Aortic dissection CHF exacerbation / volume overload Valvular disorder Pericardial effusion Arrhythmia Pericardial effusion / tamponade Pneumonia Pleural effusion / Pulmonary edema PE Pneumothorax Anxiety Severe anemia   The initial plan is to get basic labs. Admit patient to hospital for repeat echo and tele monitoting  Additional history obtained: Additional history obtained from family Records reviewed previous admission documents  Independent labs interpretation:  The following labs were independently interpreted: No anemia  Independent visualization and interpretation of imaging: - I independently visualized the following imaging with scope of interpretation limited  to determining acute life threatening conditions related to emergency care: chest xray, which revealed no pleural  effusion   Final Clinical Impression(s) / ED Diagnoses Final diagnoses:  Dyspnea, unspecified type    Rx / DC Orders ED Discharge Orders          Ordered    metoprolol succinate (TOPROL-XL) 25 MG 24 hr tablet  Daily        04/24/23 0921    amLODipine (NORVASC) 5 MG tablet  Daily        04/24/23 0921    doxycycline (VIBRA-TABS) 100 MG tablet  2 times daily        04/24/23 6440              Derwood Kaplan, MD 04/27/23 2029

## 2023-04-23 ENCOUNTER — Encounter (HOSPITAL_COMMUNITY): Payer: Self-pay | Admitting: Internal Medicine

## 2023-04-23 ENCOUNTER — Observation Stay (HOSPITAL_COMMUNITY): Payer: 59

## 2023-04-23 ENCOUNTER — Inpatient Hospital Stay (HOSPITAL_COMMUNITY): Payer: 59

## 2023-04-23 DIAGNOSIS — I132 Hypertensive heart and chronic kidney disease with heart failure and with stage 5 chronic kidney disease, or end stage renal disease: Secondary | ICD-10-CM | POA: Diagnosis present

## 2023-04-23 DIAGNOSIS — E871 Hypo-osmolality and hyponatremia: Secondary | ICD-10-CM | POA: Diagnosis present

## 2023-04-23 DIAGNOSIS — N2581 Secondary hyperparathyroidism of renal origin: Secondary | ICD-10-CM | POA: Diagnosis present

## 2023-04-23 DIAGNOSIS — E875 Hyperkalemia: Secondary | ICD-10-CM | POA: Diagnosis present

## 2023-04-23 DIAGNOSIS — I480 Paroxysmal atrial fibrillation: Secondary | ICD-10-CM | POA: Diagnosis present

## 2023-04-23 DIAGNOSIS — Z992 Dependence on renal dialysis: Secondary | ICD-10-CM | POA: Diagnosis not present

## 2023-04-23 DIAGNOSIS — Z91013 Allergy to seafood: Secondary | ICD-10-CM | POA: Diagnosis not present

## 2023-04-23 DIAGNOSIS — E1122 Type 2 diabetes mellitus with diabetic chronic kidney disease: Secondary | ICD-10-CM | POA: Diagnosis present

## 2023-04-23 DIAGNOSIS — D638 Anemia in other chronic diseases classified elsewhere: Secondary | ICD-10-CM | POA: Diagnosis present

## 2023-04-23 DIAGNOSIS — D631 Anemia in chronic kidney disease: Secondary | ICD-10-CM | POA: Diagnosis present

## 2023-04-23 DIAGNOSIS — I3139 Other pericardial effusion (noninflammatory): Secondary | ICD-10-CM

## 2023-04-23 DIAGNOSIS — J189 Pneumonia, unspecified organism: Secondary | ICD-10-CM | POA: Diagnosis present

## 2023-04-23 DIAGNOSIS — N186 End stage renal disease: Secondary | ICD-10-CM | POA: Diagnosis present

## 2023-04-23 DIAGNOSIS — Z833 Family history of diabetes mellitus: Secondary | ICD-10-CM | POA: Diagnosis not present

## 2023-04-23 DIAGNOSIS — Z950 Presence of cardiac pacemaker: Secondary | ICD-10-CM | POA: Diagnosis not present

## 2023-04-23 DIAGNOSIS — Z79899 Other long term (current) drug therapy: Secondary | ICD-10-CM | POA: Diagnosis not present

## 2023-04-23 DIAGNOSIS — R531 Weakness: Secondary | ICD-10-CM

## 2023-04-23 DIAGNOSIS — I5032 Chronic diastolic (congestive) heart failure: Secondary | ICD-10-CM | POA: Diagnosis present

## 2023-04-23 DIAGNOSIS — K7030 Alcoholic cirrhosis of liver without ascites: Secondary | ICD-10-CM | POA: Diagnosis present

## 2023-04-23 DIAGNOSIS — H547 Unspecified visual loss: Secondary | ICD-10-CM | POA: Diagnosis present

## 2023-04-23 LAB — CBC WITH DIFFERENTIAL/PLATELET
Abs Immature Granulocytes: 0.03 10*3/uL (ref 0.00–0.07)
Basophils Absolute: 0.1 10*3/uL (ref 0.0–0.1)
Basophils Relative: 1 %
Eosinophils Absolute: 0.2 10*3/uL (ref 0.0–0.5)
Eosinophils Relative: 3 %
HCT: 24.6 % — ABNORMAL LOW (ref 36.0–46.0)
Hemoglobin: 7.9 g/dL — ABNORMAL LOW (ref 12.0–15.0)
Immature Granulocytes: 1 %
Lymphocytes Relative: 19 %
Lymphs Abs: 1 10*3/uL (ref 0.7–4.0)
MCH: 32.9 pg (ref 26.0–34.0)
MCHC: 32.1 g/dL (ref 30.0–36.0)
MCV: 102.5 fL — ABNORMAL HIGH (ref 80.0–100.0)
Monocytes Absolute: 0.7 10*3/uL (ref 0.1–1.0)
Monocytes Relative: 13 %
Neutro Abs: 3.3 10*3/uL (ref 1.7–7.7)
Neutrophils Relative %: 63 %
Platelets: 134 10*3/uL — ABNORMAL LOW (ref 150–400)
RBC: 2.4 MIL/uL — ABNORMAL LOW (ref 3.87–5.11)
RDW: 14.5 % (ref 11.5–15.5)
WBC: 5.3 10*3/uL (ref 4.0–10.5)
nRBC: 0 % (ref 0.0–0.2)

## 2023-04-23 LAB — CBG MONITORING, ED
Glucose-Capillary: 171 mg/dL — ABNORMAL HIGH (ref 70–99)
Glucose-Capillary: 72 mg/dL (ref 70–99)

## 2023-04-23 LAB — COMPREHENSIVE METABOLIC PANEL
ALT: 15 U/L (ref 0–44)
AST: 27 U/L (ref 15–41)
Albumin: 3.1 g/dL — ABNORMAL LOW (ref 3.5–5.0)
Alkaline Phosphatase: 99 U/L (ref 38–126)
Anion gap: 16 — ABNORMAL HIGH (ref 5–15)
BUN: 36 mg/dL — ABNORMAL HIGH (ref 8–23)
CO2: 23 mmol/L (ref 22–32)
Calcium: 8.5 mg/dL — ABNORMAL LOW (ref 8.9–10.3)
Chloride: 92 mmol/L — ABNORMAL LOW (ref 98–111)
Creatinine, Ser: 6.67 mg/dL — ABNORMAL HIGH (ref 0.44–1.00)
GFR, Estimated: 6 mL/min — ABNORMAL LOW (ref >60)
Glucose, Bld: 96 mg/dL (ref 70–99)
Potassium: 4.7 mmol/L (ref 3.5–5.1)
Sodium: 131 mmol/L — ABNORMAL LOW (ref 135–145)
Total Bilirubin: 0.9 mg/dL (ref 0.3–1.2)
Total Protein: 7.1 g/dL (ref 6.5–8.1)

## 2023-04-23 LAB — PROTIME-INR
INR: 1.4 — ABNORMAL HIGH (ref 0.8–1.2)
Prothrombin Time: 17.5 s — ABNORMAL HIGH (ref 11.4–15.2)

## 2023-04-23 LAB — TSH: TSH: 6.326 u[IU]/mL — ABNORMAL HIGH (ref 0.350–4.500)

## 2023-04-23 LAB — PROCALCITONIN: Procalcitonin: 0.35 ng/mL

## 2023-04-23 LAB — VITAMIN B12: Vitamin B-12: 1024 pg/mL — ABNORMAL HIGH (ref 180–914)

## 2023-04-23 LAB — ECHOCARDIOGRAM LIMITED
Height: 61 in
Weight: 2080 [oz_av]

## 2023-04-23 LAB — PHOSPHORUS: Phosphorus: 5.6 mg/dL — ABNORMAL HIGH (ref 2.5–4.6)

## 2023-04-23 LAB — HEMOGLOBIN A1C
Hgb A1c MFr Bld: 4.3 % — ABNORMAL LOW (ref 4.8–5.6)
Mean Plasma Glucose: 76.71 mg/dL

## 2023-04-23 LAB — HEPATITIS B SURFACE ANTIGEN: Hepatitis B Surface Ag: NONREACTIVE

## 2023-04-23 LAB — MAGNESIUM: Magnesium: 2.8 mg/dL — ABNORMAL HIGH (ref 1.7–2.4)

## 2023-04-23 MED ORDER — SIMETHICONE 40 MG/0.6ML PO SUSP: 40.0000 mg | Freq: Four times a day (QID) | ORAL | Status: DC | PRN

## 2023-04-23 MED ORDER — ANTICOAGULANT SODIUM CITRATE 4% (200MG/5ML) IV SOLN: 5.0000 mL | Status: DC | PRN

## 2023-04-23 MED ORDER — INSULIN ASPART 100 UNIT/ML IJ SOLN
0.0000 [IU] | Freq: Three times a day (TID) | INTRAMUSCULAR | Status: DC
  Administered 2023-04-23 – 2023-04-24 (×2): 1 [IU] via SUBCUTANEOUS

## 2023-04-23 MED ORDER — ALTEPLASE 2 MG IJ SOLR: 2.0000 mg | Freq: Once | INTRAMUSCULAR | Status: DC | PRN

## 2023-04-23 MED ORDER — METOPROLOL SUCCINATE ER 25 MG PO TB24
25.0000 mg | ORAL_TABLET | Freq: Every day | ORAL | Status: DC
  Administered 2023-04-23 – 2023-04-24 (×2): 25 mg via ORAL
  Filled 2023-04-23 (×2): qty 1

## 2023-04-23 MED ORDER — HEPARIN SODIUM (PORCINE) 1000 UNIT/ML DIALYSIS: 1000.0000 [IU] | INTRAMUSCULAR | Status: DC | PRN

## 2023-04-23 MED ORDER — SEVELAMER CARBONATE 800 MG PO TABS
1600.0000 mg | ORAL_TABLET | Freq: Three times a day (TID) | ORAL | Status: DC
  Administered 2023-04-23 – 2023-04-24 (×3): 1600 mg via ORAL
  Filled 2023-04-23 (×3): qty 2

## 2023-04-23 MED ORDER — DARBEPOETIN ALFA 150 MCG/0.3ML IJ SOSY
150.0000 ug | PREFILLED_SYRINGE | INTRAMUSCULAR | Status: DC
  Administered 2023-04-23: 150 ug via SUBCUTANEOUS
  Filled 2023-04-23: qty 0.3

## 2023-04-23 MED ORDER — DOXERCALCIFEROL 4 MCG/2ML IV SOLN
2.0000 ug | INTRAVENOUS | Status: DC
  Administered 2023-04-23: 2 ug via INTRAVENOUS
  Filled 2023-04-23 (×2): qty 2

## 2023-04-23 MED ORDER — LIDOCAINE-PRILOCAINE 2.5-2.5 % EX CREA: 1.0000 | TOPICAL_CREAM | CUTANEOUS | Status: DC | PRN

## 2023-04-23 MED ORDER — PENTAFLUOROPROP-TETRAFLUOROETH EX AERO: 1.0000 | INHALATION_SPRAY | CUTANEOUS | Status: DC | PRN

## 2023-04-23 MED ORDER — BENZONATATE 100 MG PO CAPS: 200.0000 mg | ORAL_CAPSULE | Freq: Three times a day (TID) | ORAL | Status: DC | PRN

## 2023-04-23 MED ORDER — SODIUM ZIRCONIUM CYCLOSILICATE 10 G PO PACK
10.0000 g | PACK | Freq: Every day | ORAL | Status: DC
  Administered 2023-04-23: 10 g via ORAL
  Filled 2023-04-23 (×2): qty 1

## 2023-04-23 MED ORDER — CHLORHEXIDINE GLUCONATE CLOTH 2 % EX PADS
6.0000 | MEDICATED_PAD | Freq: Every day | CUTANEOUS | Status: DC
  Administered 2023-04-24: 6 via TOPICAL

## 2023-04-23 MED ORDER — SODIUM CHLORIDE 0.9 % IV SOLN
1.0000 g | Freq: Every day | INTRAVENOUS | Status: DC
  Administered 2023-04-23 – 2023-04-24 (×2): 1 g via INTRAVENOUS
  Filled 2023-04-23 (×2): qty 10

## 2023-04-23 MED ORDER — DIPHENHYDRAMINE HCL 25 MG PO CAPS
25.0000 mg | ORAL_CAPSULE | Freq: Once | ORAL | Status: AC
  Administered 2023-04-23: 25 mg via ORAL
  Filled 2023-04-23: qty 1

## 2023-04-23 MED ORDER — LIDOCAINE HCL (PF) 1 % IJ SOLN: 5.0000 mL | INTRAMUSCULAR | Status: DC | PRN

## 2023-04-23 MED ORDER — SODIUM CHLORIDE 0.9 % IV SOLN
500.0000 mg | Freq: Every day | INTRAVENOUS | Status: DC
  Administered 2023-04-23 – 2023-04-24 (×2): 500 mg via INTRAVENOUS
  Filled 2023-04-23 (×2): qty 5

## 2023-04-23 NOTE — Progress Notes (Signed)
TRH Update:  Patient c/o abdominal swelling, distention, without associated abdominal pain.  Plain film of the abdomen ordered.    Stacey Pigg, DO Hospitalist

## 2023-04-23 NOTE — Consult Note (Signed)
Cardiology Consultation   Patient ID: Stacey Chambers MRN: [id number]; DOB: 12/11/1950  Admit date: 04/22/2023 Date of Consult: 04/23/2023  PCP:  Corliss Blacker, MD   [facility] Providers Cardiologist:  Maisie Fus, MD   {  Patient Profile:   Stacey Chambers is a 72 y.o. female with a hx of recent pericardiocentesis, complete heart block status post leadless PPM, paroxysmal atrial fibrillation end-stage renal disease on dialysis Monday Wednesday Friday, alcoholic cirrhosis, hypertension, diabetes on insulin, anemia, who is being seen 04/23/2023 for the evaluation of shortness of breath at the request of Dr. Elvera Lennox.  History of Present Illness:  Patient is [name]-speaking so all communication was done via interpreter.  Ms. Zephir recently seen earlier this month by our group due to pericardial effusion with tamponade physiology status post pericardiocentesis with 400 cc of dark bloody fluid removed April 12, 2023.  Pericardial pressure went from 28-11.  Repeat limited echocardiogram showed moderate effusion so the drain was manipulated on 04/14/2023 with improved output with an additional 450cc removed with some residual draining thereafter.  During same admission she had new onset of paroxysmal atrial fibrillation during a dialysis session.  This was a very brief episode and she had quickly converted to normal sinus rhythm on IV diltiazem.  Given that she had bloody drainage from her pericardial effusion and anemia anticoagulation was not started during that admission.  She also has a new leadless pacemaker that was implanted due to bradycardia that was implanted in July 2024.  Currently patient is being evaluated for shortness of breath.  She states that she was discharged last Thursday and then subsequently Friday and Saturday started developing shortness of breath and some orthopnea.  She denied any fever, cough, peripheral edema, palpitations, chest  pain.  Otherwise patient has no other significant complaints.  She has been started on IV antibiotics due to concerns of pneumonia that were found on chest x-ray.  She states that the antibiotics have helped her breathing.  Sodium 131, 36, creatinine 6.67, phosphorus 5.6, magnesium 2.8.  BNP 2000+.  Troponins negative x 2.  Hemoglobin 7.9.  TSH 6.3.  Chest x-ray indicating possible atelectasis or infiltrate.  Stable right pleural effusion.   Past Medical History:  Diagnosis Date   Anemia    Cirrhosis (HCC)    Diabetes mellitus without complication (HCC)    ESRD (end stage renal disease) on dialysis (HCC)    Hypertension    Paroxysmal atrial fibrillation (HCC)    Type 2 diabetes mellitus (HCC)    Vision loss     Past Surgical History:  Procedure Laterality Date   AV FISTULA PLACEMENT Left 12/23/2021   Procedure: INSERTION OF LEFT ARM BRACHIOCEPHALIC ARTERIOVENOUS FISTULA;  Surgeon: Cephus Shelling, MD;  Location: [facility] OR;  Service: Vascular;  Laterality: Left;   BACK SURGERY     COLONOSCOPY     IR FLUORO GUIDE CV LINE RIGHT  12/16/2021   IR US GUIDE VASC ACCESS RIGHT  12/16/2021   PACEMAKER LEADLESS INSERTION N/A 01/15/2023   Procedure: PACEMAKER LEADLESS INSERTION;  Surgeon: Lanier Prude, MD;  Location: [facility] INVASIVE CV LAB;  Service: Cardiovascular;  Laterality: N/A;   PERICARDIOCENTESIS N/A 04/12/2023   Procedure: PERICARDIOCENTESIS;  Surgeon: Orbie Pyo, MD;  Location: [facility] INVASIVE CV LAB;  Service: Cardiovascular;  Laterality: N/A;   TEMPORARY PACEMAKER N/A 01/13/2023   Procedure: TEMPORARY PACEMAKER;  Surgeon: Orbie Pyo, MD;  Location: [facility] INVASIVE CV LAB;  Service: Cardiovascular;  Laterality: N/A;   UPPER GASTROINTESTINAL ENDOSCOPY       Inpatient Medications: Scheduled Meds:  Chlorhexidine Gluconate Cloth  6 each Topical Q0600   darbepoetin (ARANESP) injection - DIALYSIS  150 mcg Subcutaneous Q Mon-1800   doxercalciferol  2 mcg Intravenous Q M,W,F-HD    insulin aspart  0-6 Units Subcutaneous TID WC   metoprolol succinate  25 mg Oral Daily   sevelamer carbonate  1,600 mg Oral TID WC   sodium zirconium cyclosilicate  10 g Oral Daily   Continuous Infusions:  azithromycin Stopped (04/23/23 0444)   cefTRIAXone (ROCEPHIN)  IV Stopped (04/23/23 0314)   PRN Meds: acetaminophen **OR** acetaminophen, benzonatate, melatonin, ondansetron (ZOFRAN) IV, simethicone  Allergies:    Allergies  Allergen Reactions   Fish Allergy Itching    Social History:   Social History   Socioeconomic History   Marital status: Married    Spouse name: Not on file   Number of children: Not on file   Years of education: Not on file   Highest education level: Not on file  Occupational History   Not on file  Tobacco Use   Smoking status: Never   Smokeless tobacco: Never  Vaping Use   Vaping status: Never Used  Substance and Sexual Activity   Alcohol use: Never    Comment: once in awhile   Drug use: Not Currently    Types: Marijuana    Comment: Use of THC tea 1-2 times per week   Sexual activity: Not on file  Other Topics Concern   Not on file  Social History Narrative   Not on file   Social Determinants of Health   Financial Resource Strain: Not on file  Food Insecurity: No Food Insecurity (04/22/2023)   Hunger Vital Sign    Worried About Running Out of Food in the Last Year: Never true    Ran Out of Food in the Last Year: Never true  Transportation Needs: No Transportation Needs (04/22/2023)   PRAPARE - Administrator, Civil Service (Medical): No    Lack of Transportation (Non-Medical): No  Physical Activity: Not on file  Stress: Not on file  Social Connections: Unknown (09/14/2022)   Received from [facility]   Social Network    Social Network: Not on file  Intimate Partner Violence: Not At Risk (04/22/2023)   Humiliation, Afraid, Rape, and Kick questionnaire    Fear of Current or Ex-Partner: No    Emotionally  Abused: No    Physically Abused: No    Sexually Abused: No    Family History:   Family History  Problem Relation Age of Onset   Diabetes Brother    Colon cancer Neg Hx    Esophageal cancer Neg Hx    Stomach cancer Neg Hx    Pancreatic cancer Neg Hx    Inflammatory bowel disease Neg Hx    Liver disease Neg Hx    Rectal cancer Neg Hx    Colon polyps Neg Hx      ROS:  Please see the history of present illness.  All other ROS reviewed and negative.     Physical Exam/Data:   Vitals:   04/23/23 0758 04/23/23 0806 04/23/23 1008 04/23/23 1203  BP: 105/77 105/77 125/68   Pulse:  71 77   Resp:  18 20   Temp:  97.7 F (36.5 C)  (!) 97.5 F (36.4 C)  TempSrc:  Oral  Oral  SpO2: 100% 100% 100%  Weight:      Height:        Intake/Output Summary (Last 24 hours) at 04/23/2023 1421 Last data filed at 04/23/2023 0501 Gross per 24 hour  Intake 351.03 ml  Output --  Net 351.03 ml      04/22/2023    6:53 PM 04/19/2023    6:57 AM 04/18/2023    4:17 PM  Last 3 Weights  Weight (lbs) 130 lb 130 lb 4.8 oz 130 lb 11.7 oz  Weight (kg) 58.968 kg 59.104 kg 59.3 kg     Body mass index is 24.56 kg/m.  General:  Well nourished, well developed, in no acute distress HEENT: normal Neck: no JVD Vascular: No carotid bruits; Distal pulses 2+ bilaterally Cardiac:  normal S1, S2; RRR; no murmur  Lungs:  clear to auscultation bilaterally, no wheezing, rhonchi or rales  Abd: soft, nontender, no hepatomegaly  Ext: no edema Musculoskeletal:  No deformities, BUE and BLE strength normal and equal Skin: warm and dry  Neuro:  CNs 2-12 intact, no focal abnormalities noted Psych:  Normal affect   EKG:  The EKG was personally reviewed and demonstrates: Normal sinus rhythm heart rate 85.  Nonspecific ST T wave changes. Telemetry:  Telemetry was personally reviewed and demonstrates: Normal sinus rhythm heart rate 70s  Relevant CV Studies: Limited echo 04/17/2023  1. Limted echo: Small to  moderate pericardial effusion. The pericardial  effusion is circumferential. There is no evidence of cardiac tamponade.  Moderate pleural effusion in the left lateral region.   2. Left ventricular ejection fraction, by estimation, is 60 to 65%. The  left ventricle has normal function. The left ventricle has no regional  wall motion abnormalities. There is mild concentric left ventricular  hypertrophy.   3. Right ventricular systolic function is normal. The right ventricular  size is normal.   4. The aortic valve was not well visualized. Aortic valve regurgitation  is not visualized. Aortic valve sclerosis is present, with no evidence of  aortic valve stenosis.   5. The inferior vena cava is normal in size with <50% respiratory  variability, suggesting right atrial pressure of 8 mmHg.   Comparison(s): Prior images reviewed side by side. Pericardial effusion  size has slightly decreased.   Laboratory Data:  High Sensitivity Troponin:   Recent Labs  Lab 04/11/23 0951 04/11/23 1200 04/22/23 1858 04/22/23 2057  TROPONINIHS 9 11 12 12      Chemistry Recent Labs  Lab 04/18/23 0227 04/18/23 1059 04/19/23 0918 04/22/23 1858 04/22/23 2057 04/23/23 0220  NA 132*   < > 132* 131*  --  131*  K 4.0   < > 4.0 4.3  --  4.7  CL 94*   < > 93* 90*  --  92*  CO2 26   < > 25 25  --  23  GLUCOSE 123*   < > 127* 109*  --  96  BUN 27*   < > 22 33*  --  36*  CREATININE 5.76*   < > 5.39* 5.98*  --  6.67*  CALCIUM 8.6*   < > 9.1 8.5*  --  8.5*  MG 2.5*  --   --   --  2.6* 2.8*  GFRNONAA 7*   < > 8* 7*  --  6*  ANIONGAP 12   < > 14 16*  --  16*   < > = values in this interval not displayed.    Recent Labs  Lab 04/18/23 1059 04/23/23 0220  PROT 7.2 7.1  ALBUMIN 3.0* 3.1*  AST 28 27  ALT 18 15  ALKPHOS 87 99  BILITOT 0.7 0.9   Lipids No results for input(s): "CHOL", "TRIG", "HDL", "LABVLDL", "LDLCALC", "CHOLHDL" in the last 168 hours.  Hematology Recent Labs  Lab 04/19/23 0222  04/22/23 1858 04/23/23 0220  WBC 4.8 5.2 5.3  RBC 2.35* 2.33* 2.40*  HGB 7.9* 7.8* 7.9*  HCT 24.1* 24.1* 24.6*  MCV 102.6* 103.4* 102.5*  MCH 33.6 33.5 32.9  MCHC 32.8 32.4 32.1  RDW 15.2 14.6 14.5  PLT 127* 127* 134*   Thyroid  Recent Labs  Lab 04/23/23 0220  TSH 6.326*    BNP Recent Labs  Lab 04/22/23 2057  BNP 2,066.2*    DDimer No results for input(s): "DDIMER" in the last 168 hours.   Radiology/Studies:  DG Abd 1 View  Result Date: 04/23/2023 CLINICAL DATA:  Abdominal pain EXAM: ABDOMEN - 1 VIEW COMPARISON:  10/20/2022 FINDINGS: The bowel gas pattern is normal. No radio-opaque calculi or other significant radiographic abnormality are seen. Postoperative changes identified within the lumbar spine. IMPRESSION: Nonobstructive bowel gas pattern. Electronically Signed   By: Signa Kell M.D.   On: 04/23/2023 06:29   DG Chest 2 View  Result Date: 04/22/2023 CLINICAL DATA:  Chest pain, dyspnea EXAM: CHEST - 2 VIEW COMPARISON:  04/19/2023 FINDINGS: Mildly progressive bibasilar pulmonary opacities in keeping with atelectasis or infiltrate. No pneumothorax. Stable small right pleural effusion. Cardiac size is stably mildly enlarged. Lead less pacemaker again noted. Pulmonary vascularity is normal. No acute bone abnormality. IMPRESSION: 1. Mildly progressive bibasilar pulmonary opacities in keeping with atelectasis or infiltrate. 2. Stable small right pleural effusion. Electronically Signed   By: Helyn Numbers M.D.   On: 04/22/2023 20:54     Assessment and Plan:   Shortness of breath CAP Pericardial effusion status post pericardiocentesis Underwent thoracentesis April 12, 2023 and had 400 cc of dark bloody fluid removed.  Then again had drain manipulation on 04/14/2023 with another 450 cc removed with additional mild residual drainage.  Drain was eventually pulled 04/17/2023. Etiology may be due to progressing pericardial effusion versus pneumonia.  Patient does not seem  to be in heart failure appears to be euvolemic.  As such we will check an echocardiogram assess effusion.  Otherwise continue to treat pneumonia as indicated.   Paroxysmal atrial fibrillation Brief episode previous admission and converted on IV diltiazem.  She is maintaining normal sinus rhythm here.  In the setting of cardiogenic tamponade and bloody pericardial effusion with an EF anticoagulation was deferred.  Will continue to hold anticoagulation in case she needs another tap if progressive pericardial effusion.   Continue Toprol-XL 50 mg daily  Chronic HFpEF Has elevated BNP 2000+ but clinically looks euvolemic.  GDMT limited by soft BP and ESRD.  Continue beta-blocker as above   History of complete heart block status post leadless permanent pacemaker End-stage renal disease on HD MWF Chronic anemia DM    Risk Assessment/Risk Scores:   New York Heart Association (NYHA) Functional Class NYHA Class II  CHA2DS2-VASc Score = 5  This indicates a 7.2% annual risk of stroke. The patient's score is based upon: CHF History: 1 HTN History: 1 Diabetes History: 1 Stroke History: 0 Vascular Disease History: 0 Age Score: 1 Gender Score: 1   For questions or updates, please contact [facility] Please consult [email or web address] for contact info under    Signed, Abagail Kitchens, PA-C  04/23/2023 2:22 PM

## 2023-04-23 NOTE — Progress Notes (Addendum)
Received patient in bed.Awake,alert and oriented x 4. She is not having an sob and and she is on room air.Consent verified.  Medicines given: Tylenol 650 mg.                              Benadryl 25 mg.                              Hectorol 2 mcg  Access used : Left upper arm AVF that worked well.Dark reddish discoloration of his left upper arm and on the AVF site noted.  Duration of treatment : 3 hours.   Uf goal : set at 3 liters as tolerated,64 kg standing wt, EDW -59 kg.Renal PA at bedside made aware of fluid goal.  Hemo comment : Have to hold his UF on his last 40 minutes of her treatment blood pressure and Map were dropping,otherwise she was asymptomatic.  Hand off to the patient's nurse: Transported back into his room on stable medical condition,via transporter.

## 2023-04-23 NOTE — ED Notes (Signed)
CBG 171 

## 2023-04-23 NOTE — Evaluation (Signed)
Occupational Therapy Evaluation Patient Details Name: Stacey Chambers MRN: [id number] DOB: 08-Feb-1951 Today's Date: 04/23/2023   History of Present Illness Pt is a 72 yo female admitted with SOB and found to have pneumonia. Pt with recent admit for afib. PMH: ESRD on HD, CHG, afib, DM, ETOH abuse   Clinical Impression   Pt admitted with the above diagnosis and has the mild deficits listed below. Feel pt is very close to her baseline level of functioning and has had recent admissions. Pt lives with a friend who assists with all IADLs and some LE dressing.  Pt would benefit from reacher/sock aid and shoe horn but was not interested in seeing them in ER.  Will bring and explain to pt on next visit. Pt is at or very close to baseline but feel pt could be more independent than current level. Talked about energy conservation techniques and will review on next visit as well. Do not feel pt will need further post acute OT after this visit.        If plan is discharge home, recommend the following: A little help with bathing/dressing/bathroom;Assistance with cooking/housework;Assist for transportation;Help with stairs or ramp for entrance    Functional Status Assessment  Patient has had a recent decline in their functional status and demonstrates the ability to make significant improvements in function in a reasonable and predictable amount of time.  Equipment Recommendations  Other (comment) (pt thinks her 3:1 will work in tub)    Recommendations for [employer]       Precautions / Restrictions Precautions Precautions: Fall Restrictions Weight Bearing Restrictions: No      Mobility Bed Mobility Overal bed mobility: Needs Assistance Bed Mobility: Supine to Sit, Sit to Supine     Supine to sit: Supervision Sit to supine: Supervision   General bed mobility comments: cues to get up on own without pulling on therapist    Transfers Overall transfer level: Needs  assistance Equipment used: 1 person hand held assist Transfers: Sit to/from Stand, Bed to chair/wheelchair/BSC Sit to Stand: Supervision Stand pivot transfers: Supervision         General transfer comment: No physical assist needed. Cues given to attempt without assist.      Balance Overall balance assessment: Needs assistance Sitting-balance support: Feet supported Sitting balance-Leahy Scale: Good     Standing balance support: No upper extremity supported Standing balance-Leahy Scale: Fair                             ADL either performed or assessed with clinical judgement   ADL Overall ADL's : Needs assistance/impaired Eating/Feeding: Independent;Sitting   Grooming: Wash/dry hands;Oral care;Wash/dry face;Modified independent;Standing   Upper Body Bathing: Set up;Sitting   Lower Body Bathing: Sit to/from stand;Minimal assistance   Upper Body Dressing : Set up;Sitting   Lower Body Dressing: Moderate assistance;Sit to/from stand;Cueing for compensatory techniques Lower Body Dressing Details (indicate cue type and reason): pt requires assist for socks and shoes and does at baseline due to back pain.  Roommate assists. Introduced pt to sock aid but pt not interested. Toilet Transfer: Supervision/safety;Ambulation;Comfort height toilet;Grab bars Toilet Transfer Details (indicate cue type and reason): Pt walked to bathroom with supervision Toileting- Clothing Manipulation and Hygiene: Supervision/safety;Sit to/from stand;Cueing for compensatory techniques       Functional mobility during ADLs: Supervision/safety General ADL Comments: Pt requires assist for LE adls including donning socks and shoes. Pt unwilling to try on own  due to "bad back."  Pt could use sock aid but declind today.     Vision Baseline Vision/History:  (Does not see well out of L eye ever since a fall 3 years ago.) Ability to See in Adequate Light: 1 Impaired Patient Visual Report: Blurring  of vision Vision Assessment?: Vision impaired- to be further tested in functional context Additional Comments: Pt scanned L to R. Pt found items in her room. Pt states she does not see well at all out of the L eye and that it has been that way ever since a fall 3 years ago.     Perception Perception: Within Functional Limits       Praxis Praxis: WFL       Pertinent Vitals/Pain Pain Assessment Pain Assessment: Faces Faces Pain Scale: Hurts little more Pain Location: back Pain Descriptors / Indicators: Aching Pain Intervention(s): Monitored during session, Repositioned     Extremity/Trunk Assessment Upper Extremity Assessment Upper Extremity Assessment: Overall WFL for tasks assessed   Lower Extremity Assessment Lower Extremity Assessment: Defer to PT evaluation   Cervical / Trunk Assessment Cervical / Trunk Assessment: Kyphotic   Communication Communication Communication: Other (comment)   Cognition Arousal: Alert Behavior During Therapy: WFL for tasks assessed/performed Overall Cognitive Status: Within Functional Limits for tasks assessed                                 General Comments: Pt comfortable during eval, no big c/o pain. Pt moves slowly but did not need physical assist with toileting although appears unsteady at times.     General Comments  Pt appears to be very close to baseline. Pt with baseline back pain that limites LE adls.    Exercises     Shoulder Instructions      Home Living Family/patient expects to be discharged to:: Private residence Living Arrangements: Non-relatives/Friends Available Help at Discharge: Friend(s);Available 24 hours/day Type of Home: House Home Access: Level entry     Home Layout: One level     Bathroom Shower/Tub: Producer, television/film/video: Standard Bathroom Accessibility: Yes How Accessible: Accessible via walker Home Equipment: Rolling Walker (2 wheels);BSC/3in1   Additional Comments: Pt  states she needed shower chair but later said she has a 3:1 and will see if it fits in her shower.      Prior Functioning/Environment Prior Level of Function : Needs assist       Physical Assist : ADLs (physical)   ADLs (physical): Dressing;IADLs Mobility Comments: managing on her own, though reports multiple medical issues (spinal fractures, blurry vision-no longer driving, etc; states daughters and grandchildren have not spoken with her in years, her cousin visiting and she states he is the only one who checks on her ADLs Comments: pt typical can dress/bath self, needs assist with hosiery sometimes. roommate does the  cooking, cleaning, driving        OT Problem List: Impaired balance (sitting and/or standing);Decreased knowledge of use of DME or AE;Pain      OT Treatment/Interventions: Self-care/ADL training;Therapeutic activities;DME and/or AE instruction;Balance training    OT Goals(Current goals can be found in the care plan section) Acute Rehab OT Goals Patient Stated Goal: to go home soon OT Goal Formulation: With patient Time For Goal Achievement: 05/07/23 Potential to Achieve Goals: Good ADL Goals Additional ADL Goal #1: Pt will show independent use of reacher/ sock aid and shoe horn to demonstate independenced with  LE dressing. Additional ADL Goal #2: Pt will walk to bathroom and complete all toileting with mod I Additional ADL Goal #3: Pt will state 3 things she can do at home during adl routine to save energy without cues.  OT Frequency: Min 1X/week    Co-evaluation              AM-PAC OT "6 Clicks" Daily Activity     Outcome Measure Help from another person eating meals?: None Help from another person taking care of personal grooming?: None Help from another person toileting, which includes using toliet, bedpan, or urinal?: A Little Help from another person bathing (including washing, rinsing, drying)?: A Lot Help from another person to put on and taking  off regular upper body clothing?: A Little Help from another person to put on and taking off regular lower body clothing?: A Lot 6 Click Score: 18   End of Session Nurse Communication: Mobility status  Activity Tolerance: Patient tolerated treatment well Patient left: in bed;with call bell/phone within reach  OT Visit Diagnosis: Unsteadiness on feet (R26.81)                Time: 6578-4696 OT Time Calculation (min): 19 min Charges:  OT General Charges $OT Visit: 1 Visit OT Evaluation $OT Eval Low Complexity: 1 Low  Hope Budds 04/23/2023, 11:19 AM

## 2023-04-23 NOTE — ED Notes (Signed)
ED TO INPATIENT HANDOFF REPORT  ED Nurse Name and Phone #: Grover Canavan [phone]  S Name/Age/Gender Stacey Chambers 72 y.o. female Room/Bed: 007C/007C  Code Status   Code Status: Full Code  Home/SNF/Other Home Patient oriented to: self, place, time, and situation Is this baseline? Yes   Triage Complete: Triage complete  Chief Complaint Pericardial effusion [I31.39] CAP (community acquired pneumonia) [J18.9]  Triage Note Patient complains sob and chest pain and heart racing.  Patient appears anxious and nervous.  Patient was just recently dc'd.    Allergies Allergies  Allergen Reactions   Fish Allergy Itching    Level of Care/Admitting Diagnosis ED Disposition     ED Disposition  Admit   Condition  --   Comment  Hospital Area: [facility] [[id number]]  Level of Care: Progressive [102]  Admit to Progressive based on following criteria: NEPHROLOGY stable condition requiring close monitoring for AKI, requiring Hemodialysis or Peritoneal Dialysis either from expected electrolyte imbalance, acidosis, or fluid overload that can be managed by NIPPV or high flow oxygen.  May admit patient to [facility] or [facility] if equivalent level of care is available:: No  Covid Evaluation: Asymptomatic - no recent exposure (last 10 days) testing not required  Diagnosis: CAP (community acquired pneumonia) [540981]  Admitting Physician: Leatha Gilding [phone]  Attending Physician: Leatha Gilding [phone]  Certification:: I certify this patient will need inpatient services for at least 2 midnights  Expected Medical Readiness: 04/25/2023          B Medical/Surgery History Past Medical History:  Diagnosis Date   Anemia    Cirrhosis (HCC)    Diabetes mellitus without complication (HCC)    ESRD (end stage renal disease) on dialysis (HCC)    Hypertension    Paroxysmal atrial fibrillation (HCC)    Type 2 diabetes mellitus (HCC)    Vision loss    Past Surgical  History:  Procedure Laterality Date   AV FISTULA PLACEMENT Left 12/23/2021   Procedure: INSERTION OF LEFT ARM BRACHIOCEPHALIC ARTERIOVENOUS FISTULA;  Surgeon: Cephus Shelling, MD;  Location: [facility] OR;  Service: Vascular;  Laterality: Left;   BACK SURGERY     COLONOSCOPY     IR FLUORO GUIDE CV LINE RIGHT  12/16/2021   IR US GUIDE VASC ACCESS RIGHT  12/16/2021   PACEMAKER LEADLESS INSERTION N/A 01/15/2023   Procedure: PACEMAKER LEADLESS INSERTION;  Surgeon: Lanier Prude, MD;  Location: [facility] INVASIVE CV LAB;  Service: Cardiovascular;  Laterality: N/A;   PERICARDIOCENTESIS N/A 04/12/2023   Procedure: PERICARDIOCENTESIS;  Surgeon: Orbie Pyo, MD;  Location: [facility] INVASIVE CV LAB;  Service: Cardiovascular;  Laterality: N/A;   TEMPORARY PACEMAKER N/A 01/13/2023   Procedure: TEMPORARY PACEMAKER;  Surgeon: Orbie Pyo, MD;  Location: [facility] INVASIVE CV LAB;  Service: Cardiovascular;  Laterality: N/A;   UPPER GASTROINTESTINAL ENDOSCOPY       A IV Location/Drains/Wounds Patient Lines/Drains/Airways Status     Active Line/Drains/Airways     Name Placement date Placement time Site Days   Peripheral IV 04/23/23 22 G Posterior;Right Hand 04/23/23  0233  Hand  less than 1   Fistula / Graft Left Upper arm Arteriovenous fistula 12/23/21  0816  Upper arm  486            Intake/Output Last 24 hours  Intake/Output Summary (Last 24 hours) at 04/23/2023 1148 Last data filed at 04/23/2023 0501 Gross per 24 hour  Intake 351.03 ml  Output --  Net 351.03  ml    Labs/Imaging Results for orders placed or performed during the hospital encounter of 04/22/23 (from the past 48 hour(s))  Basic metabolic panel     Status: Abnormal   Collection Time: 04/22/23  6:58 PM  Result Value Ref Range   Sodium 131 (L) 135 - 145 mmol/L   Potassium 4.3 3.5 - 5.1 mmol/L   Chloride 90 (L) 98 - 111 mmol/L   CO2 25 22 - 32 mmol/L   Glucose, Bld 109 (H) 70 - 99 mg/dL    Comment: Glucose reference range applies  only to samples taken after fasting for at least 8 hours.   BUN 33 (H) 8 - 23 mg/dL   Creatinine, Ser 4.09 (H) 0.44 - 1.00 mg/dL   Calcium 8.5 (L) 8.9 - 10.3 mg/dL   GFR, Estimated 7 (L) >60 mL/min    Comment: (NOTE) Calculated using the CKD-EPI Creatinine Equation (2021)    Anion gap 16 (H) 5 - 15    Comment: Performed at [facility], [address] [place], [place] [address]  CBC     Status: Abnormal   Collection Time: 04/22/23  6:58 PM  Result Value Ref Range   WBC 5.2 4.0 - 10.5 K/uL   RBC 2.33 (L) 3.87 - 5.11 MIL/uL   Hemoglobin 7.8 (L) 12.0 - 15.0 g/dL   HCT 47.8 (L) 29.5 - 62.1 %   MCV 103.4 (H) 80.0 - 100.0 fL   MCH 33.5 26.0 - 34.0 pg   MCHC 32.4 30.0 - 36.0 g/dL   RDW 30.8 65.7 - 84.6 %   Platelets 127 (L) 150 - 400 K/uL   nRBC 0.0 0.0 - 0.2 %    Comment: Performed at [facility], [address] [place], [place] [address]  Troponin I (High Sensitivity)     Status: None   Collection Time: 04/22/23  6:58 PM  Result Value Ref Range   Troponin I (High Sensitivity) 12 <18 ng/L    Comment: (NOTE) Elevated high sensitivity troponin I (hsTnI) values and significant  changes across serial measurements may suggest ACS but many other  chronic and acute conditions are known to elevate hsTnI results.  Refer to the "Links" section for chest pain algorithms and additional  guidance. Performed at [facility], [address] [place], [place] [address]   Brain natriuretic peptide     Status: Abnormal   Collection Time: 04/22/23  8:57 PM  Result Value Ref Range   B Natriuretic Peptide 2,066.2 (H) 0.0 - 100.0 pg/mL    Comment: Performed at [facility], [address], [place], [place] [address]  Troponin I (High Sensitivity)     Status: None   Collection Time: 04/22/23  8:57 PM  Result Value Ref Range   Troponin I (High Sensitivity) 12 <18 ng/L    Comment: (NOTE) Elevated high sensitivity troponin I (hsTnI) values and significant  changes across  serial measurements may suggest ACS but many other  chronic and acute conditions are known to elevate hsTnI results.  Refer to the "Links" section for chest pain algorithms and additional  guidance. Performed at [facility], [address], [place], [place] [address]   Magnesium     Status: Abnormal   Collection Time: 04/22/23  8:57 PM  Result Value Ref Range   Magnesium 2.6 (H) 1.7 - 2.4 mg/dL    Comment: Performed at [facility], [address], [place], [place] [address]  CBC with Differential/Platelet     Status: Abnormal   Collection Time: 04/23/23  2:20 AM  Result Value Ref Range   WBC 5.3 4.0 - 10.5 K/uL   RBC 2.40 (L) 3.87 - 5.11 MIL/uL   Hemoglobin 7.9 (L) 12.0 - 15.0 g/dL   HCT 16.1 (L) 09.6 - 04.5 %   MCV 102.5 (H) 80.0 - 100.0 fL   MCH 32.9 26.0 - 34.0 pg   MCHC 32.1 30.0 - 36.0 g/dL   RDW 40.9 81.1 - 91.4 %   Platelets 134 (L) 150 - 400 K/uL   nRBC 0.0 0.0 - 0.2 %   Neutrophils Relative % 63 %   Neutro Abs 3.3 1.7 - 7.7 K/uL   Lymphocytes Relative 19 %   Lymphs Abs 1.0 0.7 - 4.0 K/uL   Monocytes Relative 13 %   Monocytes Absolute 0.7 0.1 - 1.0 K/uL   Eosinophils Relative 3 %   Eosinophils Absolute 0.2 0.0 - 0.5 K/uL   Basophils Relative 1 %   Basophils Absolute 0.1 0.0 - 0.1 K/uL   Immature Granulocytes 1 %   Abs Immature Granulocytes 0.03 0.00 - 0.07 K/uL    Comment: Performed at [facility], [address], [place], [place] [address]  Comprehensive metabolic panel     Status: Abnormal   Collection Time: 04/23/23  2:20 AM  Result Value Ref Range   Sodium 131 (L) 135 - 145 mmol/L   Potassium 4.7 3.5 - 5.1 mmol/L   Chloride 92 (L) 98 - 111 mmol/L   CO2 23 22 - 32 mmol/L   Glucose, Bld 96 70 - 99 mg/dL    Comment: Glucose reference range applies only to samples taken after fasting for at least 8 hours.   BUN 36 (H) 8 - 23 mg/dL   Creatinine, Ser 6.21 (H) 0.44 - 1.00 mg/dL   Calcium 8.5 (L) 8.9 - 10.3 mg/dL   Total Protein 7.1 6.5 - 8.1  g/dL   Albumin 3.1 (L) 3.5 - 5.0 g/dL   AST 27 15 - 41 U/L   ALT 15 0 - 44 U/L   Alkaline Phosphatase 99 38 - 126 U/L   Total Bilirubin 0.9 0.3 - 1.2 mg/dL   GFR, Estimated 6 (L) >60 mL/min    Comment: (NOTE) Calculated using the CKD-EPI Creatinine Equation (2021)    Anion gap 16 (H) 5 - 15    Comment: Performed at [facility] Lab, [address], [place], [place] [address]  Magnesium     Status: Abnormal   Collection Time: 04/23/23  2:20 AM  Result Value Ref Range   Magnesium 2.8 (H) 1.7 - 2.4 mg/dL    Comment: Performed at [facility], [address], [place], [place] [address]  Phosphorus     Status: Abnormal   Collection Time: 04/23/23  2:20 AM  Result Value Ref Range   Phosphorus 5.6 (H) 2.5 - 4.6 mg/dL    Comment: Performed at [facility], [address] [place], [place] [address]  Procalcitonin     Status: None   Collection Time: 04/23/23  2:20 AM  Result Value Ref Range   Procalcitonin 0.35 ng/mL    Comment:        Interpretation: PCT (Procalcitonin) <= 0.5 ng/mL: Systemic infection (sepsis) is not likely. Local bacterial infection is possible. (NOTE)       Sepsis PCT Algorithm           Lower Respiratory Tract  Infection PCT Algorithm    ----------------------------     ----------------------------         PCT < 0.25 ng/mL                PCT < 0.10 ng/mL          Strongly encourage             Strongly discourage   discontinuation of antibiotics    initiation of antibiotics    ----------------------------     -----------------------------       PCT 0.25 - 0.50 ng/mL            PCT 0.10 - 0.25 ng/mL               OR       >80% decrease in PCT            Discourage initiation of                                            antibiotics      Encourage discontinuation           of antibiotics    ----------------------------     -----------------------------         PCT >= 0.50 ng/mL              PCT 0.26 - 0.50  ng/mL               AND        <80% decrease in PCT             Encourage initiation of                                             antibiotics       Encourage continuation           of antibiotics    ----------------------------     -----------------------------        PCT >= 0.50 ng/mL                  PCT > 0.50 ng/mL               AND         increase in PCT                  Strongly encourage                                      initiation of antibiotics    Strongly encourage escalation           of antibiotics                                     -----------------------------                                           PCT <= 0.25 ng/mL  OR                                        > 80% decrease in PCT                                      Discontinue / Do not initiate                                             antibiotics  Performed at [facility], [address], [place], [place] [address]   TSH     Status: Abnormal   Collection Time: 04/23/23  2:20 AM  Result Value Ref Range   TSH 6.326 (H) 0.350 - 4.500 uIU/mL    Comment: Performed by a 3rd Generation assay with a functional sensitivity of <=0.01 uIU/mL. Performed at [facility], [address], [place], [place] [address]   Hemoglobin A1c     Status: Abnormal   Collection Time: 04/23/23  2:20 AM  Result Value Ref Range   Hgb A1c MFr Bld 4.3 (L) 4.8 - 5.6 %    Comment: (NOTE) Pre diabetes:          5.7%-6.4%  Diabetes:              >6.4%  Glycemic control for   <7.0% adults with diabetes    Mean Plasma Glucose 76.71 mg/dL    Comment: Performed at [facility], [address], [place], [place] [address]  Protime-INR     Status: Abnormal   Collection Time: 04/23/23  2:20 AM  Result Value Ref Range   Prothrombin Time 17.5 (H) 11.4 - 15.2 seconds   INR 1.4 (H) 0.8 - 1.2    Comment: (NOTE) INR goal varies based on device and disease states. Performed  at [facility] Lab, [address] [place], [place] [address]   Vitamin B12     Status: Abnormal   Collection Time: 04/23/23  3:30 AM  Result Value Ref Range   Vitamin B-12 1,024 (H) 180 - 914 pg/mL    Comment: (NOTE) This assay is not validated for testing neonatal or myeloproliferative syndrome specimens for Vitamin B12 levels. Performed at [facility], [address] [place], [place] [address]   CBG monitoring, ED     Status: Abnormal   Collection Time: 04/23/23  8:24 AM  Result Value Ref Range   Glucose-Capillary 171 (H) 70 - 99 mg/dL    Comment: Glucose reference range applies only to samples taken after fasting for at least 8 hours.  Hepatitis B surface antigen     Status: None   Collection Time: 04/23/23  8:51 AM  Result Value Ref Range   Hepatitis B Surface Ag NON REACTIVE NON REACTIVE    Comment: Performed at [facility], [address] [place], [place] [address]   DG Abd 1 View  Result Date: 04/23/2023 CLINICAL DATA:  Abdominal pain EXAM: ABDOMEN - 1 VIEW COMPARISON:  10/20/2022 FINDINGS: The bowel gas pattern is normal. No radio-opaque calculi or other significant radiographic abnormality are seen. Postoperative changes identified within the lumbar spine. IMPRESSION: Nonobstructive bowel gas pattern. Electronically  Signed   By: Signa Kell M.D.   On: 04/23/2023 06:29   DG Chest 2 View  Result Date: 04/22/2023 CLINICAL DATA:  Chest pain, dyspnea EXAM: CHEST - 2 VIEW COMPARISON:  04/19/2023 FINDINGS: Mildly progressive bibasilar pulmonary opacities in keeping with atelectasis or infiltrate. No pneumothorax. Stable small right pleural effusion. Cardiac size is stably mildly enlarged. Lead less pacemaker again noted. Pulmonary vascularity is normal. No acute bone abnormality. IMPRESSION: 1. Mildly progressive bibasilar pulmonary opacities in keeping with atelectasis or infiltrate. 2. Stable small right pleural effusion. Electronically Signed   By: Helyn Numbers M.D.   On: 04/22/2023 20:54    Pending Labs Unresulted Labs (From admission, onward)     Start     Ordered   04/24/23 0500  Comprehensive metabolic panel  Tomorrow morning,   R        04/23/23 1131   04/24/23 0500  CBC  Tomorrow morning,   R        04/23/23 1131   04/24/23 0500  Magnesium  Tomorrow morning,   R        04/23/23 1131   04/23/23 0851  Hepatitis B surface antibody,quantitative  (New Admission Hemo Labs (Hepatitis B))  Once,   R        04/23/23 0852   04/23/23 0158  Strep pneumoniae urinary antigen  Add-on,   AD        04/23/23 0157   Signed and Held  Renal function panel  Once,   R        Signed and Held   Signed and Held  CBC  Once,   R        Signed and Held            Vitals/Pain Today's Vitals   04/23/23 0500 04/23/23 0758 04/23/23 0806 04/23/23 1008  BP:  105/77 105/77 125/68  Pulse: 65  71 77  Resp: 18  18 20   Temp:   97.7 F (36.5 C)   TempSrc:   Oral   SpO2: 100% 100% 100% 100%  Weight:      Height:      PainSc:   0-No pain     Isolation Precautions No active isolations  Medications Medications  acetaminophen (TYLENOL) tablet 650 mg (has no administration in time range)    Or  acetaminophen (TYLENOL) suppository 650 mg (has no administration in time range)  melatonin tablet 3 mg (has no administration in time range)  ondansetron (ZOFRAN) injection 4 mg (has no administration in time range)  azithromycin (ZITHROMAX) 500 mg in sodium chloride 0.9 % 250 mL IVPB (500 mg Intravenous Not Given 04/23/23 1005)  cefTRIAXone (ROCEPHIN) 1 g in sodium chloride 0.9 % 100 mL IVPB (1 g Intravenous Not Given 04/23/23 1006)  benzonatate (TESSALON) capsule 200 mg (has no administration in time range)  metoprolol succinate (TOPROL-XL) 24 hr tablet 25 mg (25 mg Oral Given 04/23/23 1001)  sevelamer carbonate (RENVELA) tablet 1,600 mg (1,600 mg Oral Given 04/23/23 0820)  sodium zirconium cyclosilicate (LOKELMA) packet 10 g (10 g Oral Given 04/23/23 1002)   insulin aspart (novoLOG) injection 0-6 Units (1 Units Subcutaneous Given 04/23/23 0832)  Chlorhexidine Gluconate Cloth 2 % PADS 6 each (6 each Topical Not Given 04/23/23 0903)  Darbepoetin Alfa (ARANESP) injection 150 mcg (has no administration in time range)  doxercalciferol (HECTOROL) injection 2 mcg (has no administration in time range)  simethicone (MYLICON) 40 MG/0.6ML suspension 40 mg (has no administration in time range)  Mobility walks     Focused Assessments Cardiac Assessment Handoff:  Cardiac Rhythm: Normal sinus rhythm No results found for: "CKTOTAL", "CKMB", "CKMBINDEX", "TROPONINI" Lab Results  Component Value Date   DDIMER 0.87 (H) 09/17/2022   Does the Patient currently have chest pain? No   , Pulmonary Assessment Handoff:  Lung sounds: Bilateral Breath Sounds: Clear O2 Device: Room Air      R Recommendations: See Admitting Provider Note  Report given to:   Additional Notes:  Dialysis

## 2023-04-23 NOTE — Progress Notes (Signed)
Pt receives out-pt HD at [facility] [place] on MWF. Will assist as needed.   Olivia Canter Renal Navigator [phone]

## 2023-04-23 NOTE — Evaluation (Signed)
Physical Therapy Evaluation Patient Details Name: Stacey Chambers MRN: [id number] DOB: 02-May-1951 Today's Date: 04/23/2023  History of Present Illness  Pt is 72 yo presenting to [facility] with suspected community acquired pneumonia after presenting with shortness of breath. Pt had recent hospital admission for pericardial effusion with pericardiocentesis from 10-9 to 10/17. PMH - pacer, esrd on HD, HTN, DM, alcoholic cirrhosis.  Clinical Impression  Pt is presenting close to baseline from previous hospitalization. Prior to previous hospitalization pt was ambulating independently. Currently pt has been intermittently using rollator in the home. Due to pt PLOF, home set up, available assistance at home and current level of function recommending skilled physical therapy services 3x/weekly on discharge from acute care hospital setting in order to decrease risk for falls, injury and re-hospitalization. Pt tolerated treatment session well but continues to require AD due to balance deficits.         If plan is discharge home, recommend the following: A little help with walking and/or transfers;Assist for transportation     Equipment Recommendations None recommended by PT     Functional Status Assessment Patient has had a recent decline in their functional status and demonstrates the ability to make significant improvements in function in a reasonable and predictable amount of time.     Precautions / Restrictions Precautions Precautions: Fall Restrictions Weight Bearing Restrictions: No      Mobility  Bed Mobility Overal bed mobility: Modified Independent       Supine to sit: Modified independent (Device/Increase time) Sit to supine: Modified independent (Device/Increase time)        Transfers Overall transfer level: Modified independent Equipment used: Rolling walker (2 wheels) Transfers: Sit to/from Stand Sit to Stand: Modified independent (Device/Increase time)            General transfer comment: No physical assist needed.    Ambulation/Gait Ambulation/Gait assistance: Supervision Gait Distance (Feet): 400 Feet Assistive device: Rolling walker (2 wheels) Gait Pattern/deviations: Step-through pattern, Decreased stride length Gait velocity: decreased Gait velocity interpretation: >2.62 ft/sec, indicative of community ambulatory   General Gait Details: pt with good walker management  Stairs Stairs:  (not applicable)              Balance Overall balance assessment: Needs assistance Sitting-balance support: Feet supported Sitting balance-Leahy Scale: Good     Standing balance support: No upper extremity supported Standing balance-Leahy Scale: Fair Standing balance comment: no overt LOB with use of RW.         Pertinent Vitals/Pain Pain Assessment Faces Pain Scale: Hurts little more Breathing: normal Negative Vocalization: none Body Language: relaxed Consolability: no need to console Pain Location: back Pain Descriptors / Indicators: Aching Pain Intervention(s): Monitored during session, Repositioned, Limited activity within patient's tolerance    Home Living Family/patient expects to be discharged to:: Private residence Living Arrangements: Non-relatives/Friends Available Help at Discharge: Friend(s);Available 24 hours/day Type of Home: House Home Access: Level entry       Home Layout: One level Home Equipment: Agricultural consultant (2 wheels);BSC/3in1 Additional Comments: Pt states she needed shower chair but later said she has a 3:1 and will see if it fits in her shower.    Prior Function Prior Level of Function : Needs assist       Physical Assist : ADLs (physical)   ADLs (physical): Dressing;IADLs Mobility Comments: managing on her own, though reports multiple medical issues (spinal fractures, blurry vision-no longer driving, etc; states daughters and grandchildren have not spoken with her in years, her cousin  visiting and  she states he is the only one who checks on her ADLs Comments: pt typical can dress/bath self, needs assist with hosiery sometimes. roommate does the  cooking, cleaning, driving     Extremity/Trunk Assessment   Upper Extremity Assessment Upper Extremity Assessment: Defer to OT evaluation    Lower Extremity Assessment Lower Extremity Assessment: Overall WFL for tasks assessed    Cervical / Trunk Assessment Cervical / Trunk Assessment: Kyphotic  Communication   Communication Communication: No apparent difficulties  Cognition Arousal: Alert Behavior During Therapy: WFL for tasks assessed/performed Overall Cognitive Status: Within Functional Limits for tasks assessed          General Comments General comments (skin integrity, edema, etc.): Pt close to baseline.        Assessment/Plan    PT Assessment Patient needs continued PT services  PT Problem List Decreased activity tolerance;Decreased mobility;Decreased balance       PT Treatment Interventions DME instruction;Gait training;Patient/family education;Functional mobility training;Therapeutic activities;Therapeutic exercise;Balance training    PT Goals (Current goals can be found in the Care Plan section)  Acute Rehab PT Goals Patient Stated Goal: return to independent PT Goal Formulation: With patient Time For Goal Achievement: 05/07/23 Potential to Achieve Goals: Good    Frequency Min 1X/week        AM-PAC PT "6 Clicks" Mobility  Outcome Measure Help needed turning from your back to your side while in a flat bed without using bedrails?: None Help needed moving from lying on your back to sitting on the side of a flat bed without using bedrails?: None Help needed moving to and from a bed to a chair (including a wheelchair)?: None Help needed standing up from a chair using your arms (e.g., wheelchair or bedside chair)?: None Help needed to walk in hospital room?: A Little Help needed climbing 3-5 steps with a  railing? : A Little 6 Click Score: 22    End of Session Equipment Utilized During Treatment: Gait belt Activity Tolerance: Patient tolerated treatment well Patient left: with call bell/phone within reach;in bed Nurse Communication: Mobility status PT Visit Diagnosis: Other abnormalities of gait and mobility (R26.89)    Time: 8469-6295 PT Time Calculation (min) (ACUTE ONLY): 17 min   Charges:   PT Evaluation $PT Eval Low Complexity: 1 Low   PT General Charges $$ ACUTE PT VISIT: 1 Visit       Harrel Carina, DPT, CLT  Acute Rehabilitation Services Office: [phone] (Secure chat preferred)   Claudia Desanctis 04/23/2023, 11:51 AM

## 2023-04-23 NOTE — Consult Note (Signed)
ESRD Consult Note  Reason for consult: ESRD, provision of dialysis  Assessment/Recommendations:  ESRD  -outpatient HD orders: [facility].  MWF.  3.5 hours.  EDW 59 kg.  Flow rates 400/autoflow 1.5.  2K/2 calcium.  AVF, 15-gauge.  Heparin: None.  Meds: Hectorol 2 mcg every treatment, Venofer 100 mg every treatment until 10/28, Mircera 75 mcg every 2 weeks (due for this on 10/21). -HD today per MWF schedule  CAP -Abx per primary service, receiving azithro/rocephin  Volume/ hypertension  -Attempt to achieve EDW as tolerated  Anemia of Chronic Kidney Disease Hemoglobin 7.9. Start ESA today -Transfuse PRN for Hgb <7  Secondary Hyperparathyroidism/Hyperphosphatemia - resume home binders, check PO4   Vascular access -infiltrated/bruised AVF, watching for now, access seems to be functional based on exam  Recommendations were discussed with the primary team.  Anthony Sar, MD  [facility]  History of Present Illness: Stacey Chambers is a/an 72 y.o. female with a past medical history of ESRD, chronic diastolic CHF, paroxysmal A-fib, DM2, hypertension, chronic hyponatremia who presents with shortness of breath, found to have suspected CAP.  Was recently admitted here for pericardial effusion requiring pericardiocentesis, found to have rapid A-fib at that time.  Started on azithromycin and Rocephin here. Patient seen and examined bedside. She reports that her AVF was infiltrated at the time of her previous hospitalization, getting better and has been especially last Friday. Denies any worsening chest pain, new numbness/tingling of left hand, N/V, dizziness.   Medications:  Current Facility-Administered Medications  Medication Dose Route Frequency Provider Last Rate Last Admin   acetaminophen (TYLENOL) tablet 650 mg  650 mg Oral Q6H PRN Howerter, Justin B, DO       Or   acetaminophen (TYLENOL) suppository 650 mg  650 mg Rectal Q6H PRN Howerter, Justin B, DO        azithromycin (ZITHROMAX) 500 mg in sodium chloride 0.9 % 250 mL IVPB  500 mg Intravenous Daily Howerter, Justin B, DO   Stopped at 04/23/23 0444   benzonatate (TESSALON) capsule 200 mg  200 mg Oral TID PRN Howerter, Justin B, DO       cefTRIAXone (ROCEPHIN) 1 g in sodium chloride 0.9 % 100 mL IVPB  1 g Intravenous Daily Howerter, Justin B, DO   Stopped at 04/23/23 0314   insulin aspart (novoLOG) injection 0-6 Units  0-6 Units Subcutaneous TID WC Howerter, Justin B, DO   1 Units at 04/23/23 2536   melatonin tablet 3 mg  3 mg Oral QHS PRN Howerter, Justin B, DO       metoprolol succinate (TOPROL-XL) 24 hr tablet 25 mg  25 mg Oral Daily Howerter, Justin B, DO       ondansetron (ZOFRAN) injection 4 mg  4 mg Intravenous Q6H PRN Howerter, Justin B, DO       sevelamer carbonate (RENVELA) tablet 1,600 mg  1,600 mg Oral TID WC Howerter, Justin B, DO   1,600 mg at 04/23/23 0820   sodium zirconium cyclosilicate (LOKELMA) packet 10 g  10 g Oral Daily Howerter, Justin B, DO       Current Outpatient Medications  Medication Sig Dispense Refill   acetaminophen (TYLENOL) 500 MG tablet Take 500 mg by mouth every 8 (eight) hours as needed for moderate pain.     albuterol (VENTOLIN HFA) 108 (90 Base) MCG/ACT inhaler Inhale 1-2 puffs into the lungs every 6 (six) hours as needed for wheezing or shortness of breath.     amLODipine (NORVASC) 5 MG tablet  Take 1 tablet (5 mg total) by mouth daily. (Patient taking differently: Take 10 mg by mouth daily.) 30 tablet 1   Glycerin-Hypromellose-PEG 400 (VISINE DRY EYE OP) Apply 1 drop to eye 2 (two) times daily as needed (irritation).     ketorolac (ACULAR) 0.5 % ophthalmic solution Place 1 drop into the right eye 4 (four) times daily.     lidocaine-prilocaine (EMLA) cream Apply 1 Application topically once.     LOKELMA 10 g PACK packet Take 10 g by mouth daily.     metoprolol succinate (TOPROL-XL) 50 MG 24 hr tablet Take 1 tablet (50 mg total) by mouth daily. Take with or  immediately following a meal. 30 tablet 0   mirtazapine (REMERON) 15 MG tablet Take 15 mg by mouth See admin instructions. Take 1 tablet by mouth nightly on non-dialysis days     sevelamer carbonate (RENVELA) 800 MG tablet Take 1 tablet (800 mg total) by mouth 3 (three) times daily with meals. (Patient taking differently: Take 1,600 mg by mouth 3 (three) times daily with meals.) 90 tablet 1     ALLERGIES Fish allergy  MEDICAL HISTORY Past Medical History:  Diagnosis Date   Anemia    Cirrhosis (HCC)    Diabetes mellitus without complication (HCC)    ESRD (end stage renal disease) on dialysis (HCC)    Hypertension    Paroxysmal atrial fibrillation (HCC)    Type 2 diabetes mellitus (HCC)    Vision loss      SOCIAL HISTORY Social History   Socioeconomic History   Marital status: Married    Spouse name: Not on file   Number of children: Not on file   Years of education: Not on file   Highest education level: Not on file  Occupational History   Not on file  Tobacco Use   Smoking status: Never   Smokeless tobacco: Never  Vaping Use   Vaping status: Never Used  Substance and Sexual Activity   Alcohol use: Never    Comment: once in awhile   Drug use: Not Currently    Types: Marijuana    Comment: Use of THC tea 1-2 times per week   Sexual activity: Not on file  Other Topics Concern   Not on file  Social History Narrative   Not on file   Social Determinants of Health   Financial Resource Strain: Not on file  Food Insecurity: No Food Insecurity (04/22/2023)   Hunger Vital Sign    Worried About Running Out of Food in the Last Year: Never true    Ran Out of Food in the Last Year: Never true  Transportation Needs: No Transportation Needs (04/22/2023)   PRAPARE - Administrator, Civil Service (Medical): No    Lack of Transportation (Non-Medical): No  Physical Activity: Not on file  Stress: Not on file  Social Connections: Unknown (09/14/2022)   Received from  [facility]   Social Network    Social Network: Not on file  Intimate Partner Violence: Not At Risk (04/22/2023)   Humiliation, Afraid, Rape, and Kick questionnaire    Fear of Current or Ex-Partner: No    Emotionally Abused: No    Physically Abused: No    Sexually Abused: No     FAMILY HISTORY Family History  Problem Relation Age of Onset   Diabetes Brother    Colon cancer Neg Hx    Esophageal cancer Neg Hx    Stomach cancer Neg Hx  Pancreatic cancer Neg Hx    Inflammatory bowel disease Neg Hx    Liver disease Neg Hx    Rectal cancer Neg Hx    Colon polyps Neg Hx      Review of Systems: 12 systems were reviewed and negative except per HPI  Physical Exam: Vitals:   04/23/23 0758 04/23/23 0806  BP: 105/77 105/77  Pulse:  71  Resp:  18  Temp:  97.7 F (36.5 C)  SpO2: 100% 100%   No intake/output data recorded.  Intake/Output Summary (Last 24 hours) at 04/23/2023 0842 Last data filed at 04/23/2023 0501 Gross per 24 hour  Intake 351.03 ml  Output --  Net 351.03 ml   General: no acute distress HEENT: anicteric sclera, MMM CV: S1S2, RRR Lungs: CTA B/L, bilateral chest rise, normal wob Abd: soft, non-tender, non-distended Skin: +bruise around LUE AVF Ext: no sig pitting edema b/l LEs Psych: alert, engaged, appropriate mood and affect Neuro: normal speech, no gross focal deficits  Dialysis access: LUE AVF: bruised, +b/t  Test Results Reviewed Lab Results  Component Value Date   NA 131 (L) 04/23/2023   K 4.7 04/23/2023   CL 92 (L) 04/23/2023   CO2 23 04/23/2023   BUN 36 (H) 04/23/2023   CREATININE 6.67 (H) 04/23/2023   GFR 11.54 (LL) 07/07/2022   CALCIUM 8.5 (L) 04/23/2023   ALBUMIN 3.1 (L) 04/23/2023   PHOS 5.6 (H) 04/23/2023    I have reviewed relevant outside healthcare records

## 2023-04-23 NOTE — Progress Notes (Signed)
PROGRESS NOTE  Stacey Chambers TKZ:[id number] DOB: July 31, 1950 DOA: 04/22/2023 PCP: Corliss Blacker, MD   LOS: 0 days   Brief Narrative / Interim history: 72 year old female with ESRD on HD, chronic diastolic CHF, PAF, DM2, HTN, chronic hyponatremia, anemia of chronic kidney disease comes to the hospital with shortness of breath.  She was recently hospitalized and discharged 4 days prior to this admission with large pericardial effusion with concern for cardiac tamponade, status post pericardiocentesis.  She tells me she felt well upon discharge, however the following day she has been more short of breath.  Also reports intermittent cough.  Imaging on admission concerning for mild progressive bibasilar infiltrates.  She was placed on azithromycin and ceftriaxone and admitted to the hospital.  Subjective / 24h Interval events: States that her breathing is a little bit better this morning.  Seen and interviewed with the assistance of in-person [facility] [name] interpreter  Assesement and Plan: Principal Problem:   CAP (community acquired pneumonia) Active Problems:   DM2 (diabetes mellitus, type 2) (HCC)   Essential hypertension   End-stage renal disease on hemodialysis (HCC)   Pericardial effusion   Chronic diastolic CHF (congestive heart failure) (HCC)   Paroxysmal atrial fibrillation (HCC)   Generalized weakness   Chronic hyponatremia   Anemia of chronic disease  Principal problem Possible community-acquired pneumonia-due to her symptoms with new cough, chest x-ray, recent hospitalization definitely puts her at higher risk.  Continue antibiotics  Active problems Recent pericardial effusion -repeat 2D echo  ESRD-nephrology consulted, will get dialysis today  Chronic diastolic CHF-clinically appears euvolemic.  Repeat 2D echo as above  History of A-fib-single episode last time she was hospitalized, currently in sinus.  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  DM2 - continue SSI  Lab Results  Component Value Date   HGBA1C 4.3 (L) 04/23/2023   CBG (last 3)  Recent Labs    04/23/23 0824  GLUCAP 171*    Scheduled Meds:  Chlorhexidine Gluconate Cloth  6 each Topical Q0600   darbepoetin (ARANESP) injection - DIALYSIS  150 mcg Subcutaneous Q Mon-1800   doxercalciferol  2 mcg Intravenous Q M,W,F-HD   insulin aspart  0-6 Units Subcutaneous TID WC   metoprolol succinate  25 mg Oral Daily   sevelamer carbonate  1,600 mg Oral TID WC   sodium zirconium cyclosilicate  10 g Oral Daily   Continuous Infusions:  azithromycin Stopped (04/23/23 0444)   cefTRIAXone (ROCEPHIN)  IV Stopped (04/23/23 0314)   PRN Meds:.acetaminophen **OR** acetaminophen, benzonatate, melatonin, ondansetron (ZOFRAN) IV  Current Outpatient Medications  Medication Instructions   acetaminophen (TYLENOL) 500 mg, Oral, Every 8 hours PRN   albuterol (VENTOLIN HFA) 108 (90 Base) MCG/ACT inhaler 1-2 puffs, Inhalation, Every 6 hours PRN   amLODipine (NORVASC) 5 mg, Oral, Daily   Glycerin-Hypromellose-PEG 400 (VISINE DRY EYE OP) 1 drop, Ophthalmic, 2 times daily PRN   ketorolac (ACULAR) 0.5 % ophthalmic solution 1 drop, Right Eye, 4 times daily   lidocaine-prilocaine (EMLA) cream 1 Application, Topical,  Once   Lokelma 10 g, Oral, Daily   metoprolol succinate (TOPROL-XL) 50 mg, Oral, Daily, Take with or immediately following a meal.   mirtazapine (REMERON) 15 mg, Oral, See admin instructions, Take 1 tablet by mouth nightly on non-dialysis days  sevelamer carbonate (RENVELA) 800 mg, Oral, 3 times daily with meals    Diet Orders (From admission, onward)     Start     Ordered   04/22/23 2100  Diet regular Room service appropriate? Yes; Fluid  consistency: Thin  Diet effective now       Question Answer Comment  Room service appropriate? Yes   Fluid consistency: Thin      04/22/23 2059            DVT prophylaxis: SCDs Start: 04/22/23 2059   Lab Results  Component Value Date   PLT 134 (L) 04/23/2023      Code Status: Full Code  Family Communication: no family at bedside   Status is: Observation The patient will require care spanning > 2 midnights and should be moved to inpatient because: antibiotics   Level of care: Progressive  Consultants:  Nephrology   Objective: Vitals:   04/23/23 0500 04/23/23 0758 04/23/23 0806 04/23/23 1008  BP:  105/77 105/77 125/68  Pulse: 65  71 77  Resp: 18  18 20   Temp:   97.7 F (36.5 C)   TempSrc:   Oral   SpO2: 100% 100% 100% 100%  Weight:      Height:        Intake/Output Summary (Last 24 hours) at 04/23/2023 1105 Last data filed at 04/23/2023 0501 Gross per 24 hour  Intake 351.03 ml  Output --  Net 351.03 ml   Wt Readings from Last 3 Encounters:  04/22/23 59 kg  04/19/23 59.1 kg  03/20/23 64.8 kg    Examination:  Constitutional: NAD Eyes: no scleral icterus ENMT: Mucous membranes are moist.  Neck: normal, supple Respiratory: clear to auscultation bilaterally, no wheezing, no crackles. Normal respiratory effort. No accessory muscle use.  Cardiovascular: Regular rate and rhythm, no murmurs / rubs / gallops. No LE edema.  Abdomen: non distended, no tenderness. Bowel sounds positive.  Musculoskeletal: no clubbing / cyanosis.    Data Reviewed: I have independently reviewed following labs and imaging studies   CBC Recent Labs  Lab 04/18/23 1251 04/19/23 0222 04/22/23 1858 04/23/23 0220  WBC 5.5 4.8 5.2 5.3  HGB 7.7* 7.9* 7.8* 7.9*  HCT 23.8* 24.1* 24.1* 24.6*  PLT 124* 127* 127* 134*  MCV 104.4* 102.6* 103.4* 102.5*  MCH 33.8 33.6 33.5 32.9  MCHC 32.4 32.8 32.4 32.1  RDW 15.3 15.2 14.6 14.5  LYMPHSABS  --   --   --  1.0  MONOABS  --   --    --  0.7  EOSABS  --   --   --  0.2  BASOSABS  --   --   --  0.1    Recent Labs  Lab 04/17/23 0248 04/18/23 0227 04/18/23 1059 04/19/23 0222 04/19/23 0918 04/22/23 1858 04/22/23 2057 04/23/23 0220  NA 134* 132* 132* 132* 132* 131*  --  131*  K 4.1 4.0 4.2 4.1 4.0 4.3  --  4.7  CL 94* 94* 93* 92* 93* 90*  --  92*  CO2 27 26 25 29 25 25   --  23  GLUCOSE 96 123* 92 98 127* 109*  --  96  BUN 16 27* 31* 16 22 33*  --  36*  CREATININE 4.20* 5.76* 6.45* 4.65* 5.39* 5.98*  --  6.67*  CALCIUM 9.0 8.6* 9.0 8.7* 9.1 8.5*  --  8.5*  AST  --   --  28  --   --   --   --  27  ALT  --   --  18  --   --   --   --  15  ALKPHOS  --   --  87  --   --   --   --  99  BILITOT  --   --  0.7  --   --   --   --  0.9  ALBUMIN  --   --  3.0*  --   --   --   --  3.1*  MG 2.3 2.5*  --   --   --   --  2.6* 2.8*  PROCALCITON  --   --   --   --   --   --   --  0.35  INR  --   --   --   --   --   --   --  1.4*  TSH  --   --   --   --   --   --   --  6.326*  HGBA1C  --   --   --   --   --   --   --  4.3*  BNP  --   --   --   --   --   --  2,066.2*  --     ------------------------------------------------------------------------------------------------------------------ No results for input(s): "CHOL", "HDL", "LDLCALC", "TRIG", "CHOLHDL", "LDLDIRECT" in the last 72 hours.  Lab Results  Component Value Date   HGBA1C 4.3 (L) 04/23/2023   ------------------------------------------------------------------------------------------------------------------ Recent Labs    04/23/23 0220  TSH 6.326*    Cardiac Enzymes No results for input(s): "CKMB", "TROPONINI", "MYOGLOBIN" in the last 168 hours.  Invalid input(s): "CK" ------------------------------------------------------------------------------------------------------------------    Component Value Date/Time   BNP 2,066.2 (H) 04/22/2023 2057    CBG: Recent Labs  Lab 04/18/23 1649 04/18/23 2130 04/19/23 0613 04/19/23 1113 04/23/23 0824  GLUCAP  92 111* 100* 114* 171*    Recent Results (from the past 240 hour(s))  Surgical pcr screen     Status: None   Collection Time: 04/15/23  6:47 AM   Specimen: Nasal Mucosa; Nasal Swab  Result Value Ref Range Status   MRSA, PCR NEGATIVE NEGATIVE Final   Staphylococcus aureus NEGATIVE NEGATIVE Final    Comment: (NOTE) The Xpert SA Assay (FDA approved for NASAL specimens in patients 110 years of age and older), is one component of a comprehensive surveillance program. It is not intended to diagnose infection nor to guide or monitor treatment. Performed at [facility], [address], [place], [place] [address]      Radiology Studies: DG Abd 1 View  Result Date: 04/23/2023 CLINICAL DATA:  Abdominal pain EXAM: ABDOMEN - 1 VIEW COMPARISON:  10/20/2022 FINDINGS: The bowel gas pattern is normal. No radio-opaque calculi or other significant radiographic abnormality are seen. Postoperative changes identified within the lumbar spine. IMPRESSION: Nonobstructive bowel gas pattern. Electronically Signed   By: Signa Kell M.D.   On: 04/23/2023 06:29   DG Chest 2 View  Result Date: 04/22/2023 CLINICAL DATA:  Chest pain, dyspnea EXAM: CHEST - 2 VIEW COMPARISON:  04/19/2023 FINDINGS: Mildly progressive bibasilar pulmonary opacities in keeping with atelectasis or infiltrate. No pneumothorax. Stable small right pleural effusion. Cardiac size is stably mildly enlarged. Lead less pacemaker again noted. Pulmonary vascularity is normal. No acute bone abnormality. IMPRESSION: 1. Mildly progressive bibasilar pulmonary opacities in keeping with atelectasis or infiltrate. 2. Stable small right pleural effusion. Electronically Signed  By: Helyn Numbers M.D.   On: 04/22/2023 20:54     Pamella Pert, MD, PhD [facility]  Between 7 am - 7 pm I am available, please contact me via Amion (for emergencies) or Securechat (non urgent messages)  Between 7 pm - 7 am I am not available, please contact  night coverage MD/APP via Amion

## 2023-04-24 DIAGNOSIS — J189 Pneumonia, unspecified organism: Secondary | ICD-10-CM | POA: Diagnosis not present

## 2023-04-24 LAB — CBC
HCT: 24.8 % — ABNORMAL LOW (ref 36.0–46.0)
Hemoglobin: 8 g/dL — ABNORMAL LOW (ref 12.0–15.0)
MCH: 33.2 pg (ref 26.0–34.0)
MCHC: 32.3 g/dL (ref 30.0–36.0)
MCV: 102.9 fL — ABNORMAL HIGH (ref 80.0–100.0)
Platelets: 128 10*3/uL — ABNORMAL LOW (ref 150–400)
RBC: 2.41 MIL/uL — ABNORMAL LOW (ref 3.87–5.11)
RDW: 14.8 % (ref 11.5–15.5)
WBC: 3.9 10*3/uL — ABNORMAL LOW (ref 4.0–10.5)
nRBC: 0 % (ref 0.0–0.2)

## 2023-04-24 LAB — COMPREHENSIVE METABOLIC PANEL
ALT: 16 U/L (ref 0–44)
AST: 21 U/L (ref 15–41)
Albumin: 3.1 g/dL — ABNORMAL LOW (ref 3.5–5.0)
Alkaline Phosphatase: 93 U/L (ref 38–126)
Anion gap: 13 (ref 5–15)
BUN: 37 mg/dL — ABNORMAL HIGH (ref 8–23)
CO2: 24 mmol/L (ref 22–32)
Calcium: 8.3 mg/dL — ABNORMAL LOW (ref 8.9–10.3)
Chloride: 98 mmol/L (ref 98–111)
Creatinine, Ser: 6.25 mg/dL — ABNORMAL HIGH (ref 0.44–1.00)
GFR, Estimated: 7 mL/min — ABNORMAL LOW (ref >60)
Glucose, Bld: 81 mg/dL (ref 70–99)
Potassium: 4.2 mmol/L (ref 3.5–5.1)
Sodium: 135 mmol/L (ref 135–145)
Total Bilirubin: 0.5 mg/dL (ref 0.3–1.2)
Total Protein: 7.2 g/dL (ref 6.5–8.1)

## 2023-04-24 LAB — HEPATITIS B SURFACE ANTIBODY, QUANTITATIVE: Hep B S AB Quant (Post): 386 m[IU]/mL

## 2023-04-24 LAB — GLUCOSE, CAPILLARY: Glucose-Capillary: 160 mg/dL — ABNORMAL HIGH (ref 70–99)

## 2023-04-24 LAB — MAGNESIUM: Magnesium: 2.6 mg/dL — ABNORMAL HIGH (ref 1.7–2.4)

## 2023-04-24 MED ORDER — AMLODIPINE BESYLATE 5 MG PO TABS: 5.0000 mg | ORAL_TABLET | Freq: Every day | ORAL | Status: DC

## 2023-04-24 MED ORDER — METOPROLOL SUCCINATE ER 25 MG PO TB24: 25.0000 mg | ORAL_TABLET | Freq: Every day | ORAL | 0 refills | Status: DC

## 2023-04-24 MED ORDER — DOXYCYCLINE HYCLATE 100 MG PO TABS
100.0000 mg | ORAL_TABLET | Freq: Two times a day (BID) | ORAL | 0 refills | Status: AC
Start: 2023-04-24 — End: 2023-04-29

## 2023-04-24 NOTE — Progress Notes (Signed)
Mobility Specialist Progress Note:    04/24/23 0942  Mobility  Activity Ambulated with assistance in hallway  Level of Assistance Contact guard assist, steadying assist  Assistive Device Front wheel walker  Distance Ambulated (ft) 400 ft  Activity Response Tolerated well  Mobility Referral Yes  $Mobility charge 1 Mobility  Mobility Specialist Start Time (ACUTE ONLY) [id number]  Mobility Specialist Stop Time (ACUTE ONLY) 0956  Mobility Specialist Time Calculation (min) (ACUTE ONLY) 14 min   Pt received sitting EOB, agreeable to mobility session. Ambulated with CGA and RW in hallway. Tolerated well, asx throughout. Returned pt to room, all needs met, call bell in reach.   Pre Mobility HR: 77 bpm During Mobility HR : 81 bpm  Feliciana Rossetti Mobility Specialist Please contact via Special educational needs teacher or  Rehab office at [phone]

## 2023-04-24 NOTE — Discharge Planning (Signed)
[facility] Patient Discharge Orders- [facility] CLINIC: [facility]  Patient's name: Stacey Chambers Admit/DC Dates: 04/22/2023 - 04/24/2023  Discharge Diagnoses: CAP - po abx   x 5 d po doxycycline   ESRD- hD   no edw changes   HD Vascular Access- infiltrated/bruised AVF, watching for now, access seems to be functional based on exam   Aranesp: Given: 04/23/23    Date and amount of last dose: 150 mcg   Last Hgb: 8..0 PRBC's Given: no Date/# of units: 0 ESA dose for discharge: mircera 150 mcg IV q 2 weeks  from 10/21  IV Iron dose at discharge: no ho infxn  fu  next tfs   Heparin change: no changes  EDW Change: no New EDW:   [place] Change: no  Access intervention/Change: no Details:  Hectorol/Calcitriol change: no  Discharge Labs: Calcium8.3 Phosphorus 5.6 Albumin 3.1 K+ 4.2  IV Antibiotics: no Details:  On Coumadin?: no Last INR: Next INR: Managed By:   OTHER/APPTS/LAB ORDERS:    D/C Meds to be reconciled by nurse after every discharge.  Completed By:   Reviewed by: MD:______ RN_______

## 2023-04-24 NOTE — Progress Notes (Signed)
Discharge instructions reviewed with patient with [name] interpreter present.

## 2023-04-24 NOTE — TOC Initial Note (Signed)
Transition of Care [facility]) - Initial/Assessment Note    Patient Details  Name: Stacey Chambers MRN: [id number] Date of Birth: 05-Jan-1951  Transition of Care [facility]) CM/SW Contact:    Leone Haven, RN Phone Number: 04/24/2023, 10:17 AM  Clinical Narrative:                 NCM used video intreprete  [id number] to speak with patient. She is From home alone, has PCP and insurance on file, states has no HH services in place at this time , per pt eval rec HHPT, NCM offered choice, she states she does not have a preference,  NCM made referral to [facility] with [facility], she states she has a walker, rollator, and a cane.  States she will call a cab to transport her home at dc , she knows the cab driver and he helps her out. She has no support system, states [facility] [address].  Pta self ambulatory with cane or walker.   Expected Discharge Plan: Home w Home Health Services Barriers to Discharge: No Barriers Identified   Patient Goals and CMS Choice Patient states their goals for this hospitalization and ongoing recovery are:: return home CMS Medicare.gov Compare Post Acute Care list provided to:: Patient Choice offered to / list presented to : Patient      Expected Discharge Plan and Services In-house Referral: NA Discharge Planning Services: CM Consult Post Acute Care Choice: NA Living arrangements for the past 2 months: Single Family Home Expected Discharge Date: 04/24/23               DME Arranged: N/A DME Agency: NA       HH Arranged: PT HH Agency: [facility] Date HH Agency Contacted: 04/24/23 Time HH Agency Contacted: 1016 Representative spoke with at [facility] Agency: Tresa Endo  Prior Living Arrangements/Services Living arrangements for the past 2 months: Single Family Home Lives with:: Self Patient language and need for interpreter reviewed:: Yes Do you feel safe going back to the place where you live?: Yes      Need for Family Participation in  Patient Care: Yes (Comment) Care giver support system in place?: Yes (comment) Current home services: DME (walker, rollator, cane) Criminal Activity/Legal Involvement Pertinent to Current Situation/Hospitalization: No - Comment as needed  Activities of Daily Living   ADL Screening (condition at time of admission) Independently performs ADLs?: Yes (appropriate for developmental age) Is the patient deaf or have difficulty hearing?: No Does the patient have difficulty seeing, even when wearing glasses/contacts?: No Does the patient have difficulty concentrating, remembering, or making decisions?: No  Permission Sought/Granted Permission sought to share information with : Case Manager Permission granted to share information with : Yes, Verbal Permission Granted              Emotional Assessment Appearance:: Appears stated age Attitude/Demeanor/Rapport: Engaged Affect (typically observed): Appropriate Orientation: : Oriented to Self, Oriented to Place, Oriented to  Time, Oriented to Situation Alcohol / Substance Use: Not Applicable Psych Involvement: No (comment)  Admission diagnosis:  Pericardial effusion [I31.39] CAP (community acquired pneumonia) [J18.9] Dyspnea, unspecified type [R06.00] Patient Active Problem List   Diagnosis Date Noted   CAP (community acquired pneumonia) 04/23/2023   Generalized weakness 04/23/2023   Chronic hyponatremia 04/23/2023   Anemia of chronic disease 04/23/2023   Acute left flank pain 04/19/2023   Status post pericardiocentesis 04/14/2023   S/P placement of leadless cardiac pacemaker 04/14/2023   Pericardial effusion 04/12/2023   Chronic diastolic  CHF (congestive heart failure) (HCC) 04/12/2023   Rapid atrial fibrillation (HCC) 04/12/2023   Paroxysmal atrial fibrillation (HCC) 04/12/2023   New onset atrial fibrillation (HCC) 04/11/2023   Near syncope 01/17/2023   Essential hypertension 01/15/2023   End-stage renal disease on hemodialysis  (HCC) 01/15/2023   CHB (complete heart block) (HCC) 01/13/2023   Bradycardia 10/20/2022   Shock (HCC) 10/20/2022   Fluid overload 09/16/2022   Lumbar post-laminectomy syndrome 07/12/2022   Other chronic pain 07/12/2022   Sacrococcygeal disorders, not elsewhere classified 07/12/2022   Vertebrogenic low back pain 07/12/2022   Hypertension associated with diabetes (HCC) 03/30/2022   End stage renal disease on dialysis (HCC) 03/30/2022   Liver disease, chronic, with cirrhosis (HCC) 12/13/2021   Acute diastolic CHF (congestive heart failure) (HCC) 12/10/2021   COVID-19 virus infection 12/10/2021   Hypertensive urgency 12/10/2021   Diabetes mellitus type 2, controlled, without complications (HCC) 12/10/2021   Acute renal failure superimposed on stage 3b chronic kidney disease (HCC) 12/10/2021   Nephrotic syndrome 12/10/2021   Normocytic anemia 12/10/2021   Low serum albumin 12/10/2021   Thrombocytopenia (HCC) 12/10/2021   Cirrhosis (HCC) 12/01/2021   Swelling of lower extremity 12/01/2021   Acute kidney injury superimposed on CKD (HCC) 12/01/2021   DM2 (diabetes mellitus, type 2) (HCC) 12/01/2021   Abnormal LFTs 04/29/2021   Iron deficiency 04/29/2021   Gastric ulcer 04/29/2021   Splenic lesion 04/29/2021   History of gastric ulcer 03/14/2021   History of thrombocytopenia 03/14/2021   History of iron deficiency anemia 03/14/2021   Macrocytosis 03/14/2021   PCP:  Corliss Blacker, MD Pharmacy:   [id number] [place], [place] - [address] [place] [address] Phone: [phone] Fax: [phone]  [facility] [address] [place] [address] Phone: [phone] Fax: [phone]     Social Determinants of Health (SDOH) Social History: SDOH Screenings   Food Insecurity: No Food Insecurity (04/22/2023)  Housing: Low Risk  (04/22/2023)  Transportation Needs: No Transportation Needs  (04/22/2023)  Utilities: Not At Risk (04/22/2023)  Depression (PHQ2-9): Low Risk  (07/12/2022)  Social Connections: Unknown (09/14/2022)   Received from [facility]  Tobacco Use: Low Risk  (04/23/2023)   SDOH Interventions:     Readmission Risk Interventions    04/24/2023   10:14 AM  Readmission Risk Prevention Plan  Transportation Screening Complete  Medication Review (RN Care Manager) Complete  PCP or Specialist appointment within 3-5 days of discharge Complete  HRI or Home Care Consult Complete  Palliative Care Screening Not Applicable  Skilled Nursing Facility Not Applicable

## 2023-04-24 NOTE — Progress Notes (Signed)
Occupational Therapy Treatment Patient Details Name: Stacey Chambers MRN: [id number] DOB: 03/21/1951 Today's Date: 04/24/2023   History of present illness Pt is 72 yo presenting to [facility] with suspected community acquired pneumonia after presenting with shortness of breath. Pt had recent hospital admission for pericardial effusion with pericardiocentesis from 10-9 to 10/17. PMH - pacer, esrd on HD, HTN, DM, alcoholic cirrhosis.   OT comments  Pt progressing towards OT goals this session. Focused on energy conservation education, getting dressed for discharge, and tub transfer with 3 in 1 as shower chair. Pt verbalized excitement and readiness for home. OT POC remains appropriate.      If plan is discharge home, recommend the following:  A little help with bathing/dressing/bathroom;Assistance with cooking/housework;Assist for transportation;Help with stairs or ramp for entrance   Equipment Recommendations  BSC/3in1 (as shower chair)    Recommendations for Other Services      Precautions / Restrictions Precautions Precautions: Fall Restrictions Weight Bearing Restrictions: No       Mobility Bed Mobility Overal bed mobility: Modified Independent                  Transfers Overall transfer level: Modified independent Equipment used: Rolling walker (2 wheels)                     Balance Overall balance assessment: Mild deficits observed, not formally tested                                         ADL either performed or assessed with clinical judgement   ADL Overall ADL's : Needs assistance/impaired                 Upper Body Dressing : Set up;Sitting Upper Body Dressing Details (indicate cue type and reason): donning undergarmets, shirt and sweatshirt Lower Body Dressing: Set up;Sit to/from stand Lower Body Dressing Details (indicate cue type and reason): able to don socks, underwear and sweat pants         Tub/ Shower  Transfer: Contact guard assist;Tub transfer;Ambulation;BSC/3in1 Web designer Details (indicate cue type and reason): set up simulation with trash can as tub entrance and used BSC, explained in detail how to adjust to accomodate height difference between in and outside of tub Pt able to demonstrate x3        Extremity/Trunk Assessment              Vision       Perception     Praxis      Cognition Arousal: Alert Behavior During Therapy: WFL for tasks assessed/performed Overall Cognitive Status: Within Functional Limits for tasks assessed                                          Exercises      Shoulder Instructions       General Comments      Pertinent Vitals/ Pain       Pain Assessment Pain Assessment: No/denies pain Pain Intervention(s): Monitored during session, Repositioned  Home Living                                          Prior  Functioning/Environment              Frequency  Min 1X/week        Progress Toward Goals  OT Goals(current goals can now be found in the care plan section)  Progress towards OT goals: Progressing toward goals  Acute Rehab OT Goals Patient Stated Goal: get home OT Goal Formulation: With patient Time For Goal Achievement: 05/07/23 Potential to Achieve Goals: Good  Plan      Co-evaluation                 AM-PAC OT "6 Clicks" Daily Activity     Outcome Measure   Help from another person eating meals?: None Help from another person taking care of personal grooming?: None Help from another person toileting, which includes using toliet, bedpan, or urinal?: A Little Help from another person bathing (including washing, rinsing, drying)?: A Little Help from another person to put on and taking off regular upper body clothing?: None Help from another person to put on and taking off regular lower body clothing?: A Little 6 Click Score: 21    End of Session Equipment  Utilized During Treatment: Rolling walker (2 wheels)  OT Visit Diagnosis: Unsteadiness on feet (R26.81)   Activity Tolerance Patient tolerated treatment well   Patient Left in bed;with call bell/phone within reach   Nurse Communication Mobility status        Time: 3016-0109 OT Time Calculation (min): 27 min  Charges: OT General Charges $OT Visit: 1 Visit OT Treatments $Self Care/Home Management : 8-22 mins $Therapeutic Activity: 8-22 mins  Nyoka Cowden OTR/L Acute Rehabilitation Services Office: [phone]  Evern Bio [facility] 04/24/2023, 12:47 PM

## 2023-04-24 NOTE — Progress Notes (Signed)
[facility] Progress Note    Assessment/ Plan:   ESRD  -outpatient HD orders: [facility].  MWF.  3.5 hours.  EDW 59 kg.  Flow rates 400/autoflow 1.5.  2K/2 calcium.  AVF, 15-gauge.  Heparin: None.  Meds: Hectorol 2 mcg every treatment, Venofer 100 mg every treatment until 10/28, Mircera 75 mcg every 2 weeks (due for this on 10/21). -HD tomorrow per MWF schedule   CAP -Abx per primary service, receiving azithro/rocephin. This is the probable cause of her night sweats?   Volume/ hypertension  -Attempt to achieve EDW as tolerated   Anemia of Chronic Kidney Disease -Hemoglobin 8.0. s/p esa 10/21 -Transfuse PRN for Hgb <7   Secondary Hyperparathyroidism/Hyperphosphatemia - resume home binders, last PO4 acceptable   Vascular access -infiltrated/bruised AVF, watching for now, access seems to be functional based on exam   Recommendations were discussed with the primary team.  Subjective:   Patient seen and examined bedside.  No acute events overnight.  She reports that she tolerated dialysis yesterday with a net UF of 2.2 L.  Denies any issues with her bruised aVF.  She reports that her breathing is better today. She complaints of having night sweats since Friday night.   Objective:   BP (!) 102/55 (BP Location: Right Arm)   Pulse 73   Temp 97.9 F (36.6 C) (Oral)   Resp 18   Ht 5\' 1"  (1.549 m)   Wt 61.6 kg Comment: this is the patients actual weight 50 kg was a mistake.  SpO2 96%   BMI 25.66 kg/m   Intake/Output Summary (Last 24 hours) at 04/24/2023 4782 Last data filed at 04/24/2023 0818 Gross per 24 hour  Intake 240 ml  Output 2200 ml  Net -1960 ml   Weight change: 5.032 kg  Physical Exam: Gen: NAD CVS: RRR Resp: decreased breath sounds rt base >left, unlabored, normal WOB Abd: soft Ext: no sig edema b/l Les Neuro: awake, alert Dialysis access: LUE AVF with surrounding ecchymosis with good b/t  Imaging: ECHOCARDIOGRAM LIMITED  Result Date:  04/23/2023    ECHOCARDIOGRAM REPORT   Patient Name:   Stacey Chambers Date of Exam: 04/23/2023 Medical Rec #:  [id number]             Height:       61.0 in Accession #:    [id number]            Weight:       130.0 lb Date of Birth:  03-Oct-1950            BSA:          1.573 m Patient Age:    72 years              BP:           116/53 mmHg Patient Gender: F                     HR:           71 bpm. Exam Location:  Inpatient Procedure: Limited Echo and Cardiac Doppler Indications:    Pericardial Effusion I31.3  History:        Patient has prior history of Echocardiogram examinations, most                 recent 04/17/2023. Previous admission for perocardial effusion.  Sonographer:    Harriette Bouillon RDCS Referring Phys: [id number] JUSTIN B HOWERTER IMPRESSIONS  1. Left ventricular ejection fraction, by  estimation, is 60 to 65%. The left ventricle has normal function. The left ventricle has no regional wall motion abnormalities. Left ventricular diastolic parameters are indeterminate.  2. Right ventricular systolic function is normal. The right ventricular size is normal.  3. Left atrial size was mildly dilated.  4. Right atrial size was mildly dilated.  5. A small pericardial effusion is present. The pericardial effusion is localized near the right atrium. There is no evidence of cardiac tamponade.  6. The mitral valve is normal in structure.  7. The aortic valve was not well visualized. There is mild calcification of the aortic valve.  8. The inferior vena cava is normal in size with greater than 50% respiratory variability, suggesting right atrial pressure of 3 mmHg.  9. Limited echo to reassess pericardial effusion FINDINGS  Left Ventricle: Left ventricular ejection fraction, by estimation, is 60 to 65%. The left ventricle has normal function. The left ventricle has no regional wall motion abnormalities. The left ventricular internal cavity size was normal in size. There is  no left ventricular hypertrophy. Left  ventricular diastolic parameters are indeterminate. Right Ventricle: The right ventricular size is normal. No increase in right ventricular wall thickness. Right ventricular systolic function is normal. Left Atrium: Left atrial size was mildly dilated. Right Atrium: Right atrial size was mildly dilated. Pericardium: A small pericardial effusion is present. The pericardial effusion is localized near the right atrium. There is no evidence of cardiac tamponade. Mitral Valve: The mitral valve is normal in structure. Tricuspid Valve: The tricuspid valve is normal in structure. No evidence of tricuspid stenosis. Aortic Valve: The aortic valve was not well visualized. There is mild calcification of the aortic valve. Pulmonic Valve: The pulmonic valve was not well visualized. Pulmonic valve regurgitation is not visualized. No evidence of pulmonic stenosis. Aorta: The ascending aorta was not well visualized and the aortic root was not well visualized. Venous: The inferior vena cava is normal in size with greater than 50% respiratory variability, suggesting right atrial pressure of 3 mmHg. IAS/Shunts: No atrial level shunt detected by color flow Doppler.  IVC IVC diam: 1.90 cm Arvilla Meres MD Electronically signed by Arvilla Meres MD Signature Date/Time: 04/23/2023/2:53:19 PM    Final    DG Abd 1 View  Result Date: 04/23/2023 CLINICAL DATA:  Abdominal pain EXAM: ABDOMEN - 1 VIEW COMPARISON:  10/20/2022 FINDINGS: The bowel gas pattern is normal. No radio-opaque calculi or other significant radiographic abnormality are seen. Postoperative changes identified within the lumbar spine. IMPRESSION: Nonobstructive bowel gas pattern. Electronically Signed   By: Signa Kell M.D.   On: 04/23/2023 06:29   DG Chest 2 View  Result Date: 04/22/2023 CLINICAL DATA:  Chest pain, dyspnea EXAM: CHEST - 2 VIEW COMPARISON:  04/19/2023 FINDINGS: Mildly progressive bibasilar pulmonary opacities in keeping with atelectasis or  infiltrate. No pneumothorax. Stable small right pleural effusion. Cardiac size is stably mildly enlarged. Lead less pacemaker again noted. Pulmonary vascularity is normal. No acute bone abnormality. IMPRESSION: 1. Mildly progressive bibasilar pulmonary opacities in keeping with atelectasis or infiltrate. 2. Stable small right pleural effusion. Electronically Signed   By: Helyn Numbers M.D.   On: 04/22/2023 20:54    Labs: BMET Recent Labs  Lab 04/18/23 0227 04/18/23 1059 04/19/23 0222 04/19/23 0918 04/22/23 1858 04/23/23 0220 04/24/23 0405  NA 132* 132* 132* 132* 131* 131* 135  K 4.0 4.2 4.1 4.0 4.3 4.7 4.2  CL 94* 93* 92* 93* 90* 92* 98  CO2 26 25 29  25  25 23 24   GLUCOSE 123* 92 98 127* 109* 96 81  BUN 27* 31* 16 22 33* 36* 37*  CREATININE 5.76* 6.45* 4.65* 5.39* 5.98* 6.67* 6.25*  CALCIUM 8.6* 9.0 8.7* 9.1 8.5* 8.5* 8.3*  PHOS  --   --   --   --   --  5.6*  --    CBC Recent Labs  Lab 04/19/23 0222 04/22/23 1858 04/23/23 0220 04/24/23 0405  WBC 4.8 5.2 5.3 3.9*  NEUTROABS  --   --  3.3  --   HGB 7.9* 7.8* 7.9* 8.0*  HCT 24.1* 24.1* 24.6* 24.8*  MCV 102.6* 103.4* 102.5* 102.9*  PLT 127* 127* 134* 128*    Medications:     Chlorhexidine Gluconate Cloth  6 each Topical Q0600   darbepoetin (ARANESP) injection - DIALYSIS  150 mcg Subcutaneous Q Mon-1800   doxercalciferol  2 mcg Intravenous Q M,W,F-HD   insulin aspart  0-6 Units Subcutaneous TID WC   metoprolol succinate  25 mg Oral Daily   sevelamer carbonate  1,600 mg Oral TID WC   sodium zirconium cyclosilicate  10 g Oral Daily      Anthony Sar, MD [facility] 04/24/2023, 9:05 AM

## 2023-04-24 NOTE — TOC Transition Note (Signed)
Transition of Care [facility]) - CM/SW Discharge Note   Patient Details  Name: Stacey Chambers MRN: [id number] Date of Birth: 1951/04/25  Transition [facility] Care [facility]) CM/SW Contact:  Leone Haven, RN Phone Number: 04/24/2023, 10:20 AM   Clinical Narrative:    For dc today, she is set up with [facility] for HHPT,  she states she will call her cab driver friend to transport her home.    Final next level of care: Home w Home Health Services Barriers to Discharge: No Barriers Identified   Patient Goals and CMS Choice CMS Medicare.gov Compare Post Acute Care list provided to:: Patient Choice offered to / list presented to : Patient  Discharge Placement                         Discharge Plan and Services Additional resources added to the After Visit Summary for   In-house Referral: NA Discharge Planning Services: CM Consult Post Acute Care Choice: NA          DME Arranged: N/A DME Agency: NA       HH Arranged: PT HH Agency: [facility] Date HH Agency Contacted: 04/24/23 Time HH Agency Contacted: 1016 Representative spoke with at [facility] Agency: Tresa Endo  Social Determinants of Health (SDOH) Interventions SDOH Screenings   Food Insecurity: No Food Insecurity (04/22/2023)  Housing: Low Risk  (04/22/2023)  Transportation Needs: No Transportation Needs (04/22/2023)  Utilities: Not At Risk (04/22/2023)  Depression (PHQ2-9): Low Risk  (07/12/2022)  Social Connections: Unknown (09/14/2022)   Received from [facility]  Tobacco Use: Low Risk  (04/23/2023)     Readmission Risk Interventions    04/24/2023   10:14 AM  Readmission Risk Prevention Plan  Transportation Screening Complete  Medication Review Oceanographer) Complete  PCP or Specialist appointment within 3-5 days of discharge Complete  HRI or Home Care Consult Complete  Palliative Care Screening Not Applicable  Skilled Nursing Facility Not Applicable

## 2023-04-24 NOTE — Discharge Summary (Signed)
Height:       61.0 in Accession #:    [id number]            Weight:       145.9 lb Date of Birth:  1950/10/08            BSA:          1.652 m Patient Age:    72 years              BP:           117/57 mmHg Patient Gender: F                     HR:           70 bpm. Exam Location:  Inpatient Procedure: Limited Echo Indications:    Pericardial Effusion I31.3  History:        Patient has prior history of Echocardiogram examinations, most                 recent 04/12/2023.  Sonographer:    Harriette Bouillon RDCS Referring Phys: [id number] Orbie Pyo IMPRESSIONS  1. LIMITED ECHO - See report 04/12/2023 for details.  2. Left ventricular ejection fraction, by estimation, is 60 to 65%. The left  ventricle has normal function.  3. Moderate pericardial effusion. The pericardial effusion is circumferential. There is no evidence of cardiac tamponade.  4. The inferior vena cava is normal in size with <50% respiratory variability, suggesting right atrial pressure of 8 mmHg. Comparison(s): A prior study was performed on 04/12/23. Reported large pericardial effusion with hemodynamic features suggestive of tamponade. FINDINGS  Left Ventricle: Left ventricular ejection fraction, by estimation, is 60 to 65%. The left ventricle has normal function. Pericardium: A moderately sized pericardial effusion is present. The pericardial effusion is circumferential. There is no evidence of cardiac tamponade. Venous: The inferior vena cava is normal in size with less than 50% respiratory variability, suggesting right atrial pressure of 8 mmHg. [name] Electronically signed by Tessa Lerner Signature Date/Time: 04/13/2023/2:45:43 PM    Final    CARDIAC CATHETERIZATION  Result Date: 04/12/2023 1.  Successful pericardial drain placement with decrease in pericardial pressure from 28 mmHg to 11 mmHg after evacuation of 400 cc of dark bloody fluid.  The drain was secured in place and the patient was transported 2H unit for further evaluation and treatment.   ECHOCARDIOGRAM COMPLETE  Result Date: 04/12/2023    ECHOCARDIOGRAM REPORT   Patient Name:   Stacey Chambers Date of Exam: 04/12/2023 Medical Rec #:  [id number]             Height:       61.0 in Accession #:    [id number]            Weight:       134.5 lb Date of Birth:  03/07/1951            BSA:          1.596 m Patient Age:    50 years              BP:           124/77 mmHg Patient Gender: F                     HR:           67 bpm. Exam Location:  Inpatient Procedure: 2D Echo, Cardiac Doppler and Color Doppler Indications:  Height:       61.0 in Accession #:    [id number]            Weight:       145.9 lb Date of Birth:  1950/10/08            BSA:          1.652 m Patient Age:    72 years              BP:           117/57 mmHg Patient Gender: F                     HR:           70 bpm. Exam Location:  Inpatient Procedure: Limited Echo Indications:    Pericardial Effusion I31.3  History:        Patient has prior history of Echocardiogram examinations, most                 recent 04/12/2023.  Sonographer:    Harriette Bouillon RDCS Referring Phys: [id number] Orbie Pyo IMPRESSIONS  1. LIMITED ECHO - See report 04/12/2023 for details.  2. Left ventricular ejection fraction, by estimation, is 60 to 65%. The left  ventricle has normal function.  3. Moderate pericardial effusion. The pericardial effusion is circumferential. There is no evidence of cardiac tamponade.  4. The inferior vena cava is normal in size with <50% respiratory variability, suggesting right atrial pressure of 8 mmHg. Comparison(s): A prior study was performed on 04/12/23. Reported large pericardial effusion with hemodynamic features suggestive of tamponade. FINDINGS  Left Ventricle: Left ventricular ejection fraction, by estimation, is 60 to 65%. The left ventricle has normal function. Pericardium: A moderately sized pericardial effusion is present. The pericardial effusion is circumferential. There is no evidence of cardiac tamponade. Venous: The inferior vena cava is normal in size with less than 50% respiratory variability, suggesting right atrial pressure of 8 mmHg. [name] Electronically signed by Tessa Lerner Signature Date/Time: 04/13/2023/2:45:43 PM    Final    CARDIAC CATHETERIZATION  Result Date: 04/12/2023 1.  Successful pericardial drain placement with decrease in pericardial pressure from 28 mmHg to 11 mmHg after evacuation of 400 cc of dark bloody fluid.  The drain was secured in place and the patient was transported 2H unit for further evaluation and treatment.   ECHOCARDIOGRAM COMPLETE  Result Date: 04/12/2023    ECHOCARDIOGRAM REPORT   Patient Name:   Stacey Chambers Date of Exam: 04/12/2023 Medical Rec #:  [id number]             Height:       61.0 in Accession #:    [id number]            Weight:       134.5 lb Date of Birth:  03/07/1951            BSA:          1.596 m Patient Age:    50 years              BP:           124/77 mmHg Patient Gender: F                     HR:           67 bpm. Exam Location:  Inpatient Procedure: 2D Echo, Cardiac Doppler and Color Doppler Indications:  Physician Discharge Summary  Stacey Chambers ZOX:[id number] DOB: [birth year] DOA: 04/22/2023  PCP: Corliss Blacker, MD  Admit date: 04/22/2023 Discharge date: 04/24/2023  Admitted From: home Disposition:  home  Recommendations for Outpatient Follow-up:  Follow up with PCP in 1-2 weeks Please obtain BMP/CBC in one week  Home Health: none Equipment/Devices: none  Discharge Condition: stable CODE STATUS: Full code Diet Orders (From admission, onward)     Start     Ordered   04/22/23 2100  Diet regular Room service appropriate? Yes; Fluid consistency: Thin  Diet effective now       Question Answer Comment  Room service appropriate? Yes   Fluid consistency: Thin      04/22/23 2059            Brief Narrative / Interim history: 72 year old female with ESRD on HD, chronic diastolic CHF, PAF, DM2, HTN, chronic hyponatremia, anemia of chronic kidney disease comes to the hospital with shortness of breath.  She was recently hospitalized and discharged 4 days prior to this admission with large pericardial effusion with concern for cardiac tamponade, status post pericardiocentesis.  She tells me she felt well upon discharge, however the following day she has been more short of breath.  Also reports intermittent cough.  Imaging on admission concerning for mild progressive bibasilar infiltrates.  She was placed on azithromycin and ceftriaxone and admitted to the hospital.  Hospital Course / Discharge diagnoses: Principal Problem:   CAP (community acquired pneumonia) Active Problems:   DM2 (diabetes mellitus, type 2) (HCC)   Essential hypertension   End-stage renal disease on hemodialysis (HCC)   Pericardial effusion   Chronic diastolic CHF (congestive heart failure) (HCC)   Paroxysmal atrial fibrillation (HCC)   Generalized weakness   Chronic hyponatremia   Anemia of chronic disease   Principal problem Possible community-acquired pneumonia-due to her symptoms with  new cough, chest x-ray, recent hospitalization definitely puts her at higher risk for developing pneumonia.  Fluid overload is also possibility given the fact that she has received fluid bolus with her dialysis on Friday and felt short of breath afterwards.  With antibiotics and dialysis her breathing is improved, she is stable on room air and feeling much better.  She will be discharged home in stable condition, will empirically do 5 additional days of antibiotics.   Active problems Recent pericardial effusion -repeat 2D echo done does not show significant recurrence.  Cardiology consulted and evaluated patient while hospitalized  ESRD-nephrology consulted, underwent dialysis on 10/21  Chronic diastolic CHF-clinically appears euvolemic.  Repeat 2D echo as above History of A-fib-single episode last time she was hospitalized, currently in sinus.  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 DM2 - continue home medications  Sepsis ruled out   Discharge Instructions   Allergies as of 04/24/2023       Reactions   Fish Allergy Itching        Medication List     TAKE these medications    acetaminophen 500 MG tablet Commonly known as: TYLENOL Take 500 mg by mouth every 8 (eight) hours as needed for moderate pain.   albuterol 108 (90 Base) MCG/ACT inhaler Commonly known as: VENTOLIN HFA Inhale 1-2 puffs into the lungs every 6 (six) hours as needed for wheezing or shortness of breath.  atypical/viral infection. No focal airspace opacity. Electronically Signed   By: Jearld Lesch M.D.   On: 04/14/2023 14:33   ECHOCARDIOGRAM LIMITED  Result Date: 04/14/2023    ECHOCARDIOGRAM LIMITED REPORT   Patient Name:   Stacey Chambers Date of Exam: 04/14/2023 Medical Rec #:  [id number]             Height:       61.0 in Accession #:    [id number]            Weight:       138.2 lb Date of Birth:  April 24, 1951            BSA:          1.614 m Patient Age:    71 years              BP:           122/59 mmHg Patient Gender: F                     HR:           88 bpm. Exam Location:  Inpatient Procedure: Limited Echo, Color Doppler and Cardiac Doppler Indications:    Pericaridal Effusion  History:        Patient has prior history of Echocardiogram examinations, most                 recent 04/13/2023. Risk Factors:Diabetes and Hypertension.  Sonographer:    Raeford Razor RDCS Referring Phys: [phone] LINDSAY B ROBERTS IMPRESSIONS  1. Left ventricular ejection fraction, by estimation, is 60 to 65%. The left ventricle has normal function.  2. Right ventricular systolic function is normal. The right ventricular size is normal.  3. Moderate pericardial effusion. Large pleural effusion. Comparison(s): No significant change from prior study.  Conclusion(s)/Recommendation(s): Does have some RV collapse in early diastole. Inflows are borderline. The IVC is collapsing. Not consistent with cardiac tamponade. FINDINGS  Left Ventricle: Left ventricular ejection fraction, by estimation, is 60 to 65%. The left ventricle has normal function. Right Ventricle: The right ventricular size is normal. Right ventricular systolic function is normal. Pericardium: A moderately sized pericardial effusion is present. Additional Comments: There is a large pleural effusion.  LEFT VENTRICLE PLAX 2D LVIDd:         4.40 cm LVIDs:         3.00 cm LV PW:         0.90 cm LV IVS:        0.90 cm  IVC IVC diam: 2.10 cm LEFT ATRIUM             Index        RIGHT ATRIUM          Index LA diam:        4.30 cm 2.66 cm/m   RA Area:     9.92 cm LA Vol (A2C):   67.2 ml 41.63 ml/m  RA Volume:   19.20 ml 11.89 ml/m LA Vol (A4C):   48.8 ml 30.23 ml/m LA Biplane Vol: 61.7 ml 38.22 ml/m   AORTA Ao Root diam: 1.90 cm Carolan Clines Electronically signed by Carolan Clines Signature Date/Time: 04/14/2023/1:41:57 PM    Final    ECHOCARDIOGRAM LIMITED  Result Date: 04/13/2023    ECHOCARDIOGRAM LIMITED REPORT   Patient Name:   Stacey Chambers Date of Exam: 04/13/2023 Medical Rec #:  [id number]  atypical/viral infection. No focal airspace opacity. Electronically Signed   By: Jearld Lesch M.D.   On: 04/14/2023 14:33   ECHOCARDIOGRAM LIMITED  Result Date: 04/14/2023    ECHOCARDIOGRAM LIMITED REPORT   Patient Name:   Stacey Chambers Date of Exam: 04/14/2023 Medical Rec #:  [id number]             Height:       61.0 in Accession #:    [id number]            Weight:       138.2 lb Date of Birth:  April 24, 1951            BSA:          1.614 m Patient Age:    71 years              BP:           122/59 mmHg Patient Gender: F                     HR:           88 bpm. Exam Location:  Inpatient Procedure: Limited Echo, Color Doppler and Cardiac Doppler Indications:    Pericaridal Effusion  History:        Patient has prior history of Echocardiogram examinations, most                 recent 04/13/2023. Risk Factors:Diabetes and Hypertension.  Sonographer:    Raeford Razor RDCS Referring Phys: [phone] LINDSAY B ROBERTS IMPRESSIONS  1. Left ventricular ejection fraction, by estimation, is 60 to 65%. The left ventricle has normal function.  2. Right ventricular systolic function is normal. The right ventricular size is normal.  3. Moderate pericardial effusion. Large pleural effusion. Comparison(s): No significant change from prior study.  Conclusion(s)/Recommendation(s): Does have some RV collapse in early diastole. Inflows are borderline. The IVC is collapsing. Not consistent with cardiac tamponade. FINDINGS  Left Ventricle: Left ventricular ejection fraction, by estimation, is 60 to 65%. The left ventricle has normal function. Right Ventricle: The right ventricular size is normal. Right ventricular systolic function is normal. Pericardium: A moderately sized pericardial effusion is present. Additional Comments: There is a large pleural effusion.  LEFT VENTRICLE PLAX 2D LVIDd:         4.40 cm LVIDs:         3.00 cm LV PW:         0.90 cm LV IVS:        0.90 cm  IVC IVC diam: 2.10 cm LEFT ATRIUM             Index        RIGHT ATRIUM          Index LA diam:        4.30 cm 2.66 cm/m   RA Area:     9.92 cm LA Vol (A2C):   67.2 ml 41.63 ml/m  RA Volume:   19.20 ml 11.89 ml/m LA Vol (A4C):   48.8 ml 30.23 ml/m LA Biplane Vol: 61.7 ml 38.22 ml/m   AORTA Ao Root diam: 1.90 cm Carolan Clines Electronically signed by Carolan Clines Signature Date/Time: 04/14/2023/1:41:57 PM    Final    ECHOCARDIOGRAM LIMITED  Result Date: 04/13/2023    ECHOCARDIOGRAM LIMITED REPORT   Patient Name:   Stacey Chambers Date of Exam: 04/13/2023 Medical Rec #:  [id number]  amLODipine 5 MG tablet Commonly known as: NORVASC Take 1 tablet (5 mg total) by mouth daily. What changed: how much to take   doxycycline 100 MG tablet Commonly known as: VIBRA-TABS Take 1 tablet (100 mg total) by mouth 2 (two) times daily for 5 days.    ketorolac 0.5 % ophthalmic solution Commonly known as: ACULAR Place 1 drop into the right eye 4 (four) times daily.   lidocaine-prilocaine cream Commonly known as: EMLA Apply 1 Application topically once.   Lokelma 10 g Pack packet Generic drug: sodium zirconium cyclosilicate Take 10 g by mouth daily.   metoprolol succinate 25 MG 24 hr tablet Commonly known as: TOPROL-XL Take 1 tablet (25 mg total) by mouth daily. Start taking on: April 25, 2023 What changed:  medication strength how much to take additional instructions   mirtazapine 15 MG tablet Commonly known as: REMERON Take 15 mg by mouth See admin instructions. Take 1 tablet by mouth nightly on non-dialysis days   sevelamer carbonate 800 MG tablet Commonly known as: RENVELA Take 1 tablet (800 mg total) by mouth 3 (three) times daily with meals. What changed: how much to take   VISINE DRY EYE OP Apply 1 drop to eye 2 (two) times daily as needed (irritation).       Consultations: Cardiology  Nephrology   Procedures/Studies:  ECHOCARDIOGRAM LIMITED  Result Date: 04/23/2023    ECHOCARDIOGRAM REPORT   Patient Name:   Stacey Chambers Date of Exam: 04/23/2023 Medical Rec #:  [id number]             Height:       61.0 in Accession #:    [id number]            Weight:       130.0 lb Date of Birth:  [birth year]            BSA:          1.573 m Patient Age:    60 years              BP:           116/53 mmHg Patient Gender: F                     HR:           71 bpm. Exam Location:  Inpatient Procedure: Limited Echo and Cardiac Doppler Indications:    Pericardial Effusion I31.3  History:        Patient has prior history of Echocardiogram examinations, most                 recent 04/17/2023. Previous admission for perocardial effusion.  Sonographer:    Harriette Bouillon RDCS Referring Phys: [id number] JUSTIN B HOWERTER IMPRESSIONS  1. Left ventricular ejection fraction, by estimation, is 60 to 65%. The left ventricle has normal  function. The left ventricle has no regional wall motion abnormalities. Left ventricular diastolic parameters are indeterminate.  2. Right ventricular systolic function is normal. The right ventricular size is normal.  3. Left atrial size was mildly dilated.  4. Right atrial size was mildly dilated.  5. A small pericardial effusion is present. The pericardial effusion is localized near the right atrium. There is no evidence of cardiac tamponade.  6. The mitral valve is normal in structure.  7. The aortic valve was not well visualized. There is mild calcification of the aortic valve.  8. The inferior vena cava is normal in size  Physician Discharge Summary  Stacey Chambers ZOX:[id number] DOB: [birth year] DOA: 04/22/2023  PCP: Corliss Blacker, MD  Admit date: 04/22/2023 Discharge date: 04/24/2023  Admitted From: home Disposition:  home  Recommendations for Outpatient Follow-up:  Follow up with PCP in 1-2 weeks Please obtain BMP/CBC in one week  Home Health: none Equipment/Devices: none  Discharge Condition: stable CODE STATUS: Full code Diet Orders (From admission, onward)     Start     Ordered   04/22/23 2100  Diet regular Room service appropriate? Yes; Fluid consistency: Thin  Diet effective now       Question Answer Comment  Room service appropriate? Yes   Fluid consistency: Thin      04/22/23 2059            Brief Narrative / Interim history: 72 year old female with ESRD on HD, chronic diastolic CHF, PAF, DM2, HTN, chronic hyponatremia, anemia of chronic kidney disease comes to the hospital with shortness of breath.  She was recently hospitalized and discharged 4 days prior to this admission with large pericardial effusion with concern for cardiac tamponade, status post pericardiocentesis.  She tells me she felt well upon discharge, however the following day she has been more short of breath.  Also reports intermittent cough.  Imaging on admission concerning for mild progressive bibasilar infiltrates.  She was placed on azithromycin and ceftriaxone and admitted to the hospital.  Hospital Course / Discharge diagnoses: Principal Problem:   CAP (community acquired pneumonia) Active Problems:   DM2 (diabetes mellitus, type 2) (HCC)   Essential hypertension   End-stage renal disease on hemodialysis (HCC)   Pericardial effusion   Chronic diastolic CHF (congestive heart failure) (HCC)   Paroxysmal atrial fibrillation (HCC)   Generalized weakness   Chronic hyponatremia   Anemia of chronic disease   Principal problem Possible community-acquired pneumonia-due to her symptoms with  new cough, chest x-ray, recent hospitalization definitely puts her at higher risk for developing pneumonia.  Fluid overload is also possibility given the fact that she has received fluid bolus with her dialysis on Friday and felt short of breath afterwards.  With antibiotics and dialysis her breathing is improved, she is stable on room air and feeling much better.  She will be discharged home in stable condition, will empirically do 5 additional days of antibiotics.   Active problems Recent pericardial effusion -repeat 2D echo done does not show significant recurrence.  Cardiology consulted and evaluated patient while hospitalized  ESRD-nephrology consulted, underwent dialysis on 10/21  Chronic diastolic CHF-clinically appears euvolemic.  Repeat 2D echo as above History of A-fib-single episode last time she was hospitalized, currently in sinus.  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 DM2 - continue home medications  Sepsis ruled out   Discharge Instructions   Allergies as of 04/24/2023       Reactions   Fish Allergy Itching        Medication List     TAKE these medications    acetaminophen 500 MG tablet Commonly known as: TYLENOL Take 500 mg by mouth every 8 (eight) hours as needed for moderate pain.   albuterol 108 (90 Base) MCG/ACT inhaler Commonly known as: VENTOLIN HFA Inhale 1-2 puffs into the lungs every 6 (six) hours as needed for wheezing or shortness of breath.  Physician Discharge Summary  Stacey Chambers ZOX:[id number] DOB: [birth year] DOA: 04/22/2023  PCP: Corliss Blacker, MD  Admit date: 04/22/2023 Discharge date: 04/24/2023  Admitted From: home Disposition:  home  Recommendations for Outpatient Follow-up:  Follow up with PCP in 1-2 weeks Please obtain BMP/CBC in one week  Home Health: none Equipment/Devices: none  Discharge Condition: stable CODE STATUS: Full code Diet Orders (From admission, onward)     Start     Ordered   04/22/23 2100  Diet regular Room service appropriate? Yes; Fluid consistency: Thin  Diet effective now       Question Answer Comment  Room service appropriate? Yes   Fluid consistency: Thin      04/22/23 2059            Brief Narrative / Interim history: 72 year old female with ESRD on HD, chronic diastolic CHF, PAF, DM2, HTN, chronic hyponatremia, anemia of chronic kidney disease comes to the hospital with shortness of breath.  She was recently hospitalized and discharged 4 days prior to this admission with large pericardial effusion with concern for cardiac tamponade, status post pericardiocentesis.  She tells me she felt well upon discharge, however the following day she has been more short of breath.  Also reports intermittent cough.  Imaging on admission concerning for mild progressive bibasilar infiltrates.  She was placed on azithromycin and ceftriaxone and admitted to the hospital.  Hospital Course / Discharge diagnoses: Principal Problem:   CAP (community acquired pneumonia) Active Problems:   DM2 (diabetes mellitus, type 2) (HCC)   Essential hypertension   End-stage renal disease on hemodialysis (HCC)   Pericardial effusion   Chronic diastolic CHF (congestive heart failure) (HCC)   Paroxysmal atrial fibrillation (HCC)   Generalized weakness   Chronic hyponatremia   Anemia of chronic disease   Principal problem Possible community-acquired pneumonia-due to her symptoms with  new cough, chest x-ray, recent hospitalization definitely puts her at higher risk for developing pneumonia.  Fluid overload is also possibility given the fact that she has received fluid bolus with her dialysis on Friday and felt short of breath afterwards.  With antibiotics and dialysis her breathing is improved, she is stable on room air and feeling much better.  She will be discharged home in stable condition, will empirically do 5 additional days of antibiotics.   Active problems Recent pericardial effusion -repeat 2D echo done does not show significant recurrence.  Cardiology consulted and evaluated patient while hospitalized  ESRD-nephrology consulted, underwent dialysis on 10/21  Chronic diastolic CHF-clinically appears euvolemic.  Repeat 2D echo as above History of A-fib-single episode last time she was hospitalized, currently in sinus.  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 DM2 - continue home medications  Sepsis ruled out   Discharge Instructions   Allergies as of 04/24/2023       Reactions   Fish Allergy Itching        Medication List     TAKE these medications    acetaminophen 500 MG tablet Commonly known as: TYLENOL Take 500 mg by mouth every 8 (eight) hours as needed for moderate pain.   albuterol 108 (90 Base) MCG/ACT inhaler Commonly known as: VENTOLIN HFA Inhale 1-2 puffs into the lungs every 6 (six) hours as needed for wheezing or shortness of breath.  1.10 cm  IVC IVC diam: 1.80 cm Riley Lam MD Electronically signed by Riley Lam MD Signature Date/Time: 04/17/2023/10:02:58 AM    Final    ECHOCARDIOGRAM LIMITED  Result Date: 04/15/2023    ECHOCARDIOGRAM LIMITED REPORT   Patient Name:   Satonya AMAVILIA Curnutt Date of Exam: 04/15/2023 Medical Rec #:  [id number]             Height:       61.0 in Accession #:    [id number]            Weight:       136.2 lb Date of Birth:   Aug 26, 1950            BSA:          1.604 m Patient Age:    71 years              BP:           102/53 mmHg Patient Gender: F                     HR:           82 bpm. Exam Location:  Inpatient Procedure: Limited Echo Indications:    I31.3 Pericardial effusion  History:        Patient has prior history of Echocardiogram examinations, most                 recent 04/14/2023. Arrythmias:Atrial Fibrillation; Risk                 Factors:Hypertension, Diabetes and ESRD.  Sonographer:    Irving Burton Senior RDCS Referring Phys: [id number] Eugenio Hoes  Sonographer Comments: Limited to check effusion for possible window, Dr. Leafy Ro at bedside. IMPRESSIONS  1. LIMITED ECHO: Full echo was done on 04/12/2023.  2. Left ventricular ejection fraction, by estimation, is 60 to 65%. The left ventricle has normal function.  3. Right ventricular systolic function grossly normal.  4. Predominantly small. The pericardial effusion is circumferential and stranding noted in the pericardial space. There is no evidence of cardiac tamponade.  5. The mitral valve is degenerative.  6. The aortic valve was not well visualized.  7. The inferior vena cava is normal in size with <50% respiratory variability, suggesting right atrial pressure of 8 mmHg. Comparison(s): A prior study was performed on 04/14/2023. Size of the pericardial effusion has reduced from moderate to small. IVC is now normal in size. Conclusion(s)/Recommendation(s): Repeat limited echo prior to drain removal. FINDINGS  Left Ventricle: Left ventricular ejection fraction, by estimation, is 60 to 65%. The left ventricle has normal function. Right Ventricle: Right ventricular systolic function grossly normal. Pericardium: Predominantly small. The pericardial effusion is circumferential and stranding noted in the pericardial space. There is no evidence of cardiac tamponade. Mitral Valve: The mitral valve is degenerative in appearance. Mild mitral annular calcification. Tricuspid Valve: The  tricuspid valve is grossly normal. Aortic Valve: The aortic valve was not well visualized. Venous: The inferior vena cava is normal in size with less than 50% respiratory variability, suggesting right atrial pressure of 8 mmHg. [name] Electronically signed by Tessa Lerner Signature Date/Time: 04/15/2023/9:34:27 AM    Final    DG CHEST PORT 1 VIEW  Result Date: 04/14/2023 CLINICAL DATA:  Chest pain EXAM: PORTABLE CHEST 1 VIEW COMPARISON:  04/11/2023 FINDINGS: Gross cardiomegaly with implantable loop recorder. Diffuse interstitial opacity. No acute osseous findings. IMPRESSION: Gross cardiomegaly with diffuse interstitial opacity, consistent with edema or  Physician Discharge Summary  Stacey Chambers ZOX:[id number] DOB: [birth year] DOA: 04/22/2023  PCP: Corliss Blacker, MD  Admit date: 04/22/2023 Discharge date: 04/24/2023  Admitted From: home Disposition:  home  Recommendations for Outpatient Follow-up:  Follow up with PCP in 1-2 weeks Please obtain BMP/CBC in one week  Home Health: none Equipment/Devices: none  Discharge Condition: stable CODE STATUS: Full code Diet Orders (From admission, onward)     Start     Ordered   04/22/23 2100  Diet regular Room service appropriate? Yes; Fluid consistency: Thin  Diet effective now       Question Answer Comment  Room service appropriate? Yes   Fluid consistency: Thin      04/22/23 2059            Brief Narrative / Interim history: 72 year old female with ESRD on HD, chronic diastolic CHF, PAF, DM2, HTN, chronic hyponatremia, anemia of chronic kidney disease comes to the hospital with shortness of breath.  She was recently hospitalized and discharged 4 days prior to this admission with large pericardial effusion with concern for cardiac tamponade, status post pericardiocentesis.  She tells me she felt well upon discharge, however the following day she has been more short of breath.  Also reports intermittent cough.  Imaging on admission concerning for mild progressive bibasilar infiltrates.  She was placed on azithromycin and ceftriaxone and admitted to the hospital.  Hospital Course / Discharge diagnoses: Principal Problem:   CAP (community acquired pneumonia) Active Problems:   DM2 (diabetes mellitus, type 2) (HCC)   Essential hypertension   End-stage renal disease on hemodialysis (HCC)   Pericardial effusion   Chronic diastolic CHF (congestive heart failure) (HCC)   Paroxysmal atrial fibrillation (HCC)   Generalized weakness   Chronic hyponatremia   Anemia of chronic disease   Principal problem Possible community-acquired pneumonia-due to her symptoms with  new cough, chest x-ray, recent hospitalization definitely puts her at higher risk for developing pneumonia.  Fluid overload is also possibility given the fact that she has received fluid bolus with her dialysis on Friday and felt short of breath afterwards.  With antibiotics and dialysis her breathing is improved, she is stable on room air and feeling much better.  She will be discharged home in stable condition, will empirically do 5 additional days of antibiotics.   Active problems Recent pericardial effusion -repeat 2D echo done does not show significant recurrence.  Cardiology consulted and evaluated patient while hospitalized  ESRD-nephrology consulted, underwent dialysis on 10/21  Chronic diastolic CHF-clinically appears euvolemic.  Repeat 2D echo as above History of A-fib-single episode last time she was hospitalized, currently in sinus.  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 DM2 - continue home medications  Sepsis ruled out   Discharge Instructions   Allergies as of 04/24/2023       Reactions   Fish Allergy Itching        Medication List     TAKE these medications    acetaminophen 500 MG tablet Commonly known as: TYLENOL Take 500 mg by mouth every 8 (eight) hours as needed for moderate pain.   albuterol 108 (90 Base) MCG/ACT inhaler Commonly known as: VENTOLIN HFA Inhale 1-2 puffs into the lungs every 6 (six) hours as needed for wheezing or shortness of breath.  amLODipine 5 MG tablet Commonly known as: NORVASC Take 1 tablet (5 mg total) by mouth daily. What changed: how much to take   doxycycline 100 MG tablet Commonly known as: VIBRA-TABS Take 1 tablet (100 mg total) by mouth 2 (two) times daily for 5 days.    ketorolac 0.5 % ophthalmic solution Commonly known as: ACULAR Place 1 drop into the right eye 4 (four) times daily.   lidocaine-prilocaine cream Commonly known as: EMLA Apply 1 Application topically once.   Lokelma 10 g Pack packet Generic drug: sodium zirconium cyclosilicate Take 10 g by mouth daily.   metoprolol succinate 25 MG 24 hr tablet Commonly known as: TOPROL-XL Take 1 tablet (25 mg total) by mouth daily. Start taking on: April 25, 2023 What changed:  medication strength how much to take additional instructions   mirtazapine 15 MG tablet Commonly known as: REMERON Take 15 mg by mouth See admin instructions. Take 1 tablet by mouth nightly on non-dialysis days   sevelamer carbonate 800 MG tablet Commonly known as: RENVELA Take 1 tablet (800 mg total) by mouth 3 (three) times daily with meals. What changed: how much to take   VISINE DRY EYE OP Apply 1 drop to eye 2 (two) times daily as needed (irritation).       Consultations: Cardiology  Nephrology   Procedures/Studies:  ECHOCARDIOGRAM LIMITED  Result Date: 04/23/2023    ECHOCARDIOGRAM REPORT   Patient Name:   Stacey Chambers Date of Exam: 04/23/2023 Medical Rec #:  [id number]             Height:       61.0 in Accession #:    [id number]            Weight:       130.0 lb Date of Birth:  [birth year]            BSA:          1.573 m Patient Age:    60 years              BP:           116/53 mmHg Patient Gender: F                     HR:           71 bpm. Exam Location:  Inpatient Procedure: Limited Echo and Cardiac Doppler Indications:    Pericardial Effusion I31.3  History:        Patient has prior history of Echocardiogram examinations, most                 recent 04/17/2023. Previous admission for perocardial effusion.  Sonographer:    Harriette Bouillon RDCS Referring Phys: [id number] JUSTIN B HOWERTER IMPRESSIONS  1. Left ventricular ejection fraction, by estimation, is 60 to 65%. The left ventricle has normal  function. The left ventricle has no regional wall motion abnormalities. Left ventricular diastolic parameters are indeterminate.  2. Right ventricular systolic function is normal. The right ventricular size is normal.  3. Left atrial size was mildly dilated.  4. Right atrial size was mildly dilated.  5. A small pericardial effusion is present. The pericardial effusion is localized near the right atrium. There is no evidence of cardiac tamponade.  6. The mitral valve is normal in structure.  7. The aortic valve was not well visualized. There is mild calcification of the aortic valve.  8. The inferior vena cava is normal in size  Physician Discharge Summary  Stacey Chambers ZOX:[id number] DOB: [birth year] DOA: 04/22/2023  PCP: Corliss Blacker, MD  Admit date: 04/22/2023 Discharge date: 04/24/2023  Admitted From: home Disposition:  home  Recommendations for Outpatient Follow-up:  Follow up with PCP in 1-2 weeks Please obtain BMP/CBC in one week  Home Health: none Equipment/Devices: none  Discharge Condition: stable CODE STATUS: Full code Diet Orders (From admission, onward)     Start     Ordered   04/22/23 2100  Diet regular Room service appropriate? Yes; Fluid consistency: Thin  Diet effective now       Question Answer Comment  Room service appropriate? Yes   Fluid consistency: Thin      04/22/23 2059            Brief Narrative / Interim history: 72 year old female with ESRD on HD, chronic diastolic CHF, PAF, DM2, HTN, chronic hyponatremia, anemia of chronic kidney disease comes to the hospital with shortness of breath.  She was recently hospitalized and discharged 4 days prior to this admission with large pericardial effusion with concern for cardiac tamponade, status post pericardiocentesis.  She tells me she felt well upon discharge, however the following day she has been more short of breath.  Also reports intermittent cough.  Imaging on admission concerning for mild progressive bibasilar infiltrates.  She was placed on azithromycin and ceftriaxone and admitted to the hospital.  Hospital Course / Discharge diagnoses: Principal Problem:   CAP (community acquired pneumonia) Active Problems:   DM2 (diabetes mellitus, type 2) (HCC)   Essential hypertension   End-stage renal disease on hemodialysis (HCC)   Pericardial effusion   Chronic diastolic CHF (congestive heart failure) (HCC)   Paroxysmal atrial fibrillation (HCC)   Generalized weakness   Chronic hyponatremia   Anemia of chronic disease   Principal problem Possible community-acquired pneumonia-due to her symptoms with  new cough, chest x-ray, recent hospitalization definitely puts her at higher risk for developing pneumonia.  Fluid overload is also possibility given the fact that she has received fluid bolus with her dialysis on Friday and felt short of breath afterwards.  With antibiotics and dialysis her breathing is improved, she is stable on room air and feeling much better.  She will be discharged home in stable condition, will empirically do 5 additional days of antibiotics.   Active problems Recent pericardial effusion -repeat 2D echo done does not show significant recurrence.  Cardiology consulted and evaluated patient while hospitalized  ESRD-nephrology consulted, underwent dialysis on 10/21  Chronic diastolic CHF-clinically appears euvolemic.  Repeat 2D echo as above History of A-fib-single episode last time she was hospitalized, currently in sinus.  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 DM2 - continue home medications  Sepsis ruled out   Discharge Instructions   Allergies as of 04/24/2023       Reactions   Fish Allergy Itching        Medication List     TAKE these medications    acetaminophen 500 MG tablet Commonly known as: TYLENOL Take 500 mg by mouth every 8 (eight) hours as needed for moderate pain.   albuterol 108 (90 Base) MCG/ACT inhaler Commonly known as: VENTOLIN HFA Inhale 1-2 puffs into the lungs every 6 (six) hours as needed for wheezing or shortness of breath.

## 2023-04-24 NOTE — Progress Notes (Signed)
D/C order noted. Contacted [facility] to advise clinic of pt's d/c today and that pt should resume care tomorrow.   Olivia Canter Renal Navigator [phone]

## 2023-05-02 ENCOUNTER — Ambulatory Visit: Payer: 59 | Attending: Cardiology | Admitting: Cardiology

## 2023-05-02 ENCOUNTER — Encounter: Payer: Self-pay | Admitting: Cardiology

## 2023-05-02 VITALS — BP 100/54 | HR 83 | Ht 61.0 in | Wt 141.0 lb

## 2023-05-02 DIAGNOSIS — R188 Other ascites: Secondary | ICD-10-CM

## 2023-05-02 DIAGNOSIS — I442 Atrioventricular block, complete: Secondary | ICD-10-CM | POA: Diagnosis not present

## 2023-05-02 DIAGNOSIS — K7460 Unspecified cirrhosis of liver: Secondary | ICD-10-CM

## 2023-05-02 DIAGNOSIS — Z950 Presence of cardiac pacemaker: Secondary | ICD-10-CM

## 2023-05-02 NOTE — Progress Notes (Signed)
Electrophysiology Office Follow up Visit Note:    Date:  05/02/2023   ID:  Corian, Ruse 1951-03-13, MRN [id number]  PCP:  Corliss Blacker, MD  [facility] Cardiologist:  Maisie Fus, MD  [facility] Electrophysiologist:  Lanier Prude, MD    Interval History:     Stacey Chambers is a 72 y.o. female who presents for a follow up visit.   Discussed the use of AI scribe software for clinical note transcription with the patient, who gave verbal consent to proceed.  History of Present Illness   Stacey Chambers, with a history of end stage renal disease, alcoholic cirrhosis, hypertension, diabetes, anemia, chronic pericardial effusion, and chronic pleural effusions, presents for follow up. She has been hospitalized multiple times with shortness of breath, found to have pleural effusions, and edema. At one point her pericardial effusion was drained. She reports palpitations or a strong heartbeat that lasts about a minute or so. She also reports that she has been receiving dialysis, but has had complications with fluid accumulation in the lung and around the heart. She has no one to help her with her medications and has them marked at home. She has not seen her kidney doctor in quite some time and does not have a family doctor. She does have a primary care physician (PCP) that she sees every two weeks.            Past medical, surgical, social and family history were reviewed.  ROS:   Please see the history of present illness.    All other systems reviewed and are negative.  EKGs/Labs/Other Studies Reviewed:    The following studies were reviewed today:  05/02/2023 in clinic device interrogation personally reviewed Pacing and sensing stable Histograms appropriate       Physical Exam:    VS:  BP (!) 100/54   Pulse 83   Ht 5\' 1"  (1.549 m)   Wt 141 lb (64 kg)   SpO2 96%   BMI 26.64 kg/m     Wt Readings from Last 3 Encounters:  05/02/23 141  lb (64 kg)  04/24/23 135 lb 12.9 oz (61.6 kg)  04/19/23 130 lb 4.8 oz (59.1 kg)    Physical Exam   GENERAL: Chronically ill appearing female in a wheelchair, not short of breath, no increased work of breathing. CHEST: Decreased air movement to bilateral bases. CARDIOVASCULAR: Heart rhythm is regular.           ASSESSMENT:    1. CHB (complete heart block) (HCC)   2. Cardiac pacemaker in situ   3. Cirrhosis of liver with ascites, unspecified hepatic cirrhosis type (HCC)    PLAN:    In order of problems listed above:  Assessment and Plan    Complete Heart Block with Permanent Pacemaker Pacemaker functioning well on remote monitoring. Patient reports intermittent palpitations lasting about a minute. -Continue remote monitoring of pacemaker. -Encourage patient to report any changes in symptoms.  End Stage Renal Disease (ESRD) on Dialysis Patient reports recent hospitalization for pleural effusion and pericardial effusion, likely secondary to ESRD and cirrhosis. -Continue regular dialysis sessions to manage fluid overload. -Encourage patient to report any changes in symptoms.  Alcoholic Cirrhosis with Complications Recurrent pleural and pericardial effusions likely secondary to cirrhosis. Thrombocytopenia, anemia, leukopenia, and low albumin also present. -Continue follow-up with gastroenterology team and primary care physician.  Medication Management Concern for confusion with medication regimen. -Schedule appointment with primary care physician within the next week  to review all medications. Patient should bring all medication bottles to this appointment.  Follow-up -Schedule follow-up with EP, APP in one year.               Signed, Steffanie Dunn, MD, [facility], [facility] 05/02/2023 5:01 PM    Electrophysiology [facility]

## 2023-05-02 NOTE — Patient Instructions (Addendum)
Medication Instructions:  Your physician recommends that you continue on your current medications as directed. Please refer to the Current Medication list given to you today.  *If you need a refill on your cardiac medications before your next appointment, please call your pharmacy* Follow-Up: At [facility], you and your health needs are our priority.  As part of our continuing mission to provide you with exceptional heart care, we have created designated Provider Care Teams.  These Care Teams include your primary Cardiologist (physician) and Advanced Practice Providers (APPs -  Physician Assistants and Nurse Practitioners) who all work together to provide you with the care you need, when you need it.  Your next appointment:   1 year  Provider:   You will see one of the following Advanced Practice Providers on your designated Care Team:   Francis Dowse, Charlott Holler "Mardelle Matte" [place], [place] Sherie Don, NP Canary Brim, NP  Other Instructions Please call your primary care provider and schedule an appointment within the next week to discuss your medications

## 2023-06-05 ENCOUNTER — Encounter: Payer: Self-pay | Admitting: Internal Medicine

## 2023-06-05 ENCOUNTER — Encounter (HOSPITAL_COMMUNITY): Payer: 59

## 2023-06-05 ENCOUNTER — Ambulatory Visit: Payer: 59 | Attending: Internal Medicine | Admitting: Internal Medicine

## 2023-06-05 VITALS — BP 122/58 | HR 77 | Ht 61.0 in | Wt 141.0 lb

## 2023-06-05 DIAGNOSIS — Z950 Presence of cardiac pacemaker: Secondary | ICD-10-CM

## 2023-06-05 MED ORDER — LIDOCAINE-PRILOCAINE 2.5-2.5 % EX CREA: 1.0000 | TOPICAL_CREAM | Freq: Once | CUTANEOUS | 0 refills | Status: AC

## 2023-06-05 NOTE — Progress Notes (Signed)
Cardiology Office Note:    Date:  06/05/2023   ID:  Stacey, Chambers October 03, 1950, MRN [id number]  PCP:  Corliss Blacker, MD   [facility] Providers Cardiologist:  Maisie Fus, MD Electrophysiologist:  Lanier Prude, MD     Referring MD: Lourena Simmonds, Donald Pore, *   No chief complaint on file. HFpEF, High grade AV block  History of Present Illness:    Stacey Chambers is a 72 y.o. female with a hx of  ESRD, etoh cirrhosis, referral from Dr. Alvy Bimler for HFpeF.  In April, she was admitted for seizure-like activity. She was dialyzed. She had high grade AV block with narrow complex escape rhythm. Echo showed no WMA. No indication for PPM/ temp pacer. Likely in the setting of ESRD.  Recent negative MM panel Today, she comes in for FU. She makes some urine. No LE edema, PND or orthopnea. No prior cardiac dx hx. No smoking She is from [place]. She cooks during the day and cleans her house. She can get SOB with activity, no CP She uses albuterol   Interim 06/05/2023 When I last saw the patient in June she had prior complete heart block in the setting of hyperkalemia.  She followed up with the EP in July and noted that she was not feeling well, EMS was called and she was found to be in complete heart block again.  She is managed with a dopamine drip.  She underwent temporary pacemaker via the right internal jugular. On 01/15/2023 she underwent a successful Medtronic Micra leadless pacemaker. Notes she has pain in her fistula.   Current Medications: Current Outpatient Medications on File Prior to Visit  Medication Sig Dispense Refill   acetaminophen (TYLENOL) 500 MG tablet Take 500 mg by mouth every 8 (eight) hours as needed for moderate pain.     albuterol (VENTOLIN HFA) 108 (90 Base) MCG/ACT inhaler Inhale 1-2 puffs into the lungs every 6 (six) hours as needed for wheezing or shortness of breath.     amLODipine (NORVASC) 5 MG tablet Take 1 tablet (5 mg  total) by mouth daily.     Glycerin-Hypromellose-PEG 400 (VISINE DRY EYE OP) Apply 1 drop to eye 2 (two) times daily as needed (irritation).     ketorolac (ACULAR) 0.5 % ophthalmic solution Place 1 drop into the right eye 4 (four) times daily.     lidocaine-prilocaine (EMLA) cream Apply 1 Application topically once.     LOKELMA 10 g PACK packet Take 10 g by mouth daily.     metoprolol succinate (TOPROL-XL) 25 MG 24 hr tablet Take 1 tablet (25 mg total) by mouth daily. 30 tablet 0   mirtazapine (REMERON) 15 MG tablet Take 15 mg by mouth See admin instructions. Take 1 tablet by mouth nightly on non-dialysis days     sevelamer carbonate (RENVELA) 800 MG tablet Take 1 tablet (800 mg total) by mouth 3 (three) times daily with meals. (Patient taking differently: Take 1,600 mg by mouth 3 (three) times daily with meals.) 90 tablet 1   No current facility-administered medications on file prior to visit.      Family History: The patient's family history includes Diabetes in her brother. There is no history of Colon cancer, Esophageal cancer, Stomach cancer, Pancreatic cancer, Inflammatory bowel disease, Liver disease, Rectal cancer, or Colon polyps. No premature CAD  ROS:   Please see the history of present illness.     All other systems reviewed and are negative.  EKGs/Labs/Other  Studies Reviewed:    The following studies were reviewed today:  09/16/2022-TTE: EF 60-65%, RV fxn is normal, mild PHTN 41 mmHg, elevated EDP, small pericardial effusion. She has had multiple repeat echoes; most recent shows no significant changes  EKG:  EKG is  ordered today.  The ekg ordered today demonstrates   12/19/2022- NSR, RBBB  Recent Labs: 04/22/2023: B Natriuretic Peptide 2,066.2 04/23/2023: TSH 6.326 04/24/2023: ALT 16; BUN 37; Creatinine, Ser 6.25; Hemoglobin 8.0; Magnesium 2.6; Platelets 128; Potassium 4.2; Sodium 135   Recent Lipid Panel    Component Value Date/Time   CHOL 144 03/30/2022 1327    TRIG 137 10/27/2022 0700   HDL 61.60 03/30/2022 1327   CHOLHDL 2 03/30/2022 1327   VLDL 21.2 03/30/2022 1327   LDLCALC 62 03/30/2022 1327     Risk Assessment/Calculations:     Physical Exam:    VS:   Vitals:   06/05/23 1024  BP: (!) 122/58  Pulse: 77  SpO2: 96%     Wt Readings from Last 3 Encounters:  05/02/23 141 lb (64 kg)  04/24/23 135 lb 12.9 oz (61.6 kg)  04/19/23 130 lb 4.8 oz (59.1 kg)     GEN:  Well nourished, well developed in no acute distress HEENT: Normal NECK: No JVD; CARDIAC: RRR, SEM RUSB, rubs, gallops RESPIRATORY:  Clear to auscultation without rales, wheezing or rhonchi  ABDOMEN: Soft, non-tender, non-distended MUSCULOSKELETAL:  No edema; No deformity  SKIN: Warm and dry NEUROLOGIC:  Alert and oriented x 3 PSYCHIATRIC:  Normal affect   ASSESSMENT:   CHB: Status post micra leadless pacemaker  HFpEF/ESRD MWF:  Normal LV function, small pericardial effusion UF per IHD  HTN: well controlled. Continue norvasc 5 mg daily. Continue BB  DM2?hx: A1c 4.3%  Pain in her fistula Has a good thrill. No signs of infection.  Will assess with an ultrasound   PLAN:    In order of problems listed above:   LUE duplex Lidocaine-prilocaine refill Follow up 6 months           Medication Adjustments/Labs and Tests Ordered: Current medicines are reviewed at length with the patient today.  Concerns regarding medicines are outlined above.  No orders of the defined types were placed in this encounter.  No orders of the defined types were placed in this encounter.   There are no Patient Instructions on file for this visit.   Signed, Maisie Fus, MD  06/05/2023 10:14 AM    [facility]

## 2023-06-05 NOTE — Patient Instructions (Signed)
Medication Instructions:  No changes  *If you need a refill on your cardiac medications before your next appointment, please call your pharmacy*      Testing/Procedures: Left upper ext doppler  Your physician has requested that you have a lower or upper extremity venous duplex. This test is an ultrasound of the veins in the legs or arms. It looks at venous blood flow that carries blood from the heart to the legs or arms. Allow one hour for a Lower Venous exam. Allow thirty minutes for an Upper Venous exam. There are no restrictions or special instructions.  Please note: We ask at that you not bring children with you during ultrasound (echo/ vascular) testing. Due to room size and safety concerns, children are not allowed in the ultrasound rooms during exams. Our front office staff cannot provide observation of children in our lobby area while testing is being conducted. An adult accompanying a patient to their appointment will only be allowed in the ultrasound room at the discretion of the ultrasound technician under special circumstances. We apologize for any inconvenience.    Follow-Up: At [facility], you and your health needs are our priority.  As part of our continuing mission to provide you with exceptional heart care, we have created designated Provider Care Teams.  These Care Teams include your primary Cardiologist (physician) and Advanced Practice Providers (APPs -  Physician Assistants and Nurse Practitioners) who all work together to provide you with the care you need, when you need it.  We recommend signing up for the patient portal called "MyChart".  Sign up information is provided on this After Visit Summary.  MyChart is used to connect with patients for Virtual Visits (Telemedicine).  Patients are able to view lab/test results, encounter notes, upcoming appointments, etc.  Non-urgent messages can be sent to your provider as well.   To learn more about what you can do with  MyChart, go to [email or web address].    Your next appointment:   6 month(s)  Provider:   Maisie Fus, MD

## 2023-06-12 ENCOUNTER — Telehealth (HOSPITAL_COMMUNITY): Payer: Self-pay

## 2023-06-12 ENCOUNTER — Other Ambulatory Visit: Payer: Self-pay | Admitting: Hematology and Oncology

## 2023-06-12 DIAGNOSIS — D539 Nutritional anemia, unspecified: Secondary | ICD-10-CM

## 2023-06-12 NOTE — Telephone Encounter (Signed)
VIA INTERPRETER -  Attempted to contact patient at primary number due to need to RS appointment due to incorrect location. We were unable to contact the patient at their primary number. We were able to reach emergency contact [place].   Provided message about need to get patient scheduled at other location due to scheduling error.

## 2023-06-13 ENCOUNTER — Inpatient Hospital Stay: Payer: 59 | Admitting: Hematology and Oncology

## 2023-06-13 ENCOUNTER — Inpatient Hospital Stay: Payer: 59 | Attending: Hematology and Oncology

## 2023-06-19 ENCOUNTER — Ambulatory Visit (HOSPITAL_COMMUNITY)
Admission: RE | Admit: 2023-06-19 | Payer: 59 | Source: Ambulatory Visit | Attending: Internal Medicine | Admitting: Internal Medicine

## 2023-06-19 ENCOUNTER — Ambulatory Visit (HOSPITAL_COMMUNITY)
Admission: RE | Admit: 2023-06-19 | Discharge: 2023-06-19 | Disposition: A | Discharge status: Home / Self Care | Payer: 59 | Source: Ambulatory Visit | Attending: Internal Medicine | Admitting: Internal Medicine

## 2023-06-19 DIAGNOSIS — Z950 Presence of cardiac pacemaker: Secondary | ICD-10-CM | POA: Insufficient documentation

## 2023-07-17 ENCOUNTER — Telehealth: Payer: Self-pay | Admitting: Internal Medicine

## 2023-07-17 NOTE — Telephone Encounter (Signed)
 STAT if patient feels like he/she is going to faint   Are you dizzy now?   Do you feel faint or have you passed out?  Dizziness for 1 week and near syncope per Geni with [facility] (PCP)   Do you have any other symptoms?  Bilateral GVT   Have you checked your HR and BP (record if available)?  1/14: 138/80

## 2023-07-17 NOTE — Telephone Encounter (Signed)
 Called and spoke to [place] [address] [place], with patients PCP Office  [place] states:   -Dr. Sigrid, patients PCP requests patient has sooner OV with cardiology   -patient presented to office today with sx: dizziness, near syncope and bilateral JVD   -BP today us  138/80 Patient scheduled for OV 1/16 with Dr. Alvan at 8:40am  Geni has no further questions at this time

## 2023-07-19 ENCOUNTER — Ambulatory Visit: Payer: 59 | Attending: Internal Medicine | Admitting: Internal Medicine

## 2023-07-19 VITALS — BP 130/0 | HR 94 | Ht 59.0 in | Wt 145.0 lb

## 2023-07-19 DIAGNOSIS — I442 Atrioventricular block, complete: Secondary | ICD-10-CM

## 2023-07-19 DIAGNOSIS — Z950 Presence of cardiac pacemaker: Secondary | ICD-10-CM

## 2023-07-19 DIAGNOSIS — I3139 Other pericardial effusion (noninflammatory): Secondary | ICD-10-CM

## 2023-07-19 NOTE — Patient Instructions (Signed)
Medication Instructions:  No changes  *If you need a refill on your cardiac medications before your next appointment, please call your pharmacy*   Lab Work: None  If you have labs (blood work) drawn today and your tests are completely normal, you will receive your results only by: MyChart Message (if you have MyChart) OR A paper copy in the mail If you have any lab test that is abnormal or we need to change your treatment, we will call you to review the results.   Testing/Procedures: Echo to be scheduled STAT Your physician has requested that you have an echocardiogram. Echocardiography is a painless test that uses sound waves to create images of your heart. It provides your doctor with information about the size and shape of your heart and how well your heart's chambers and valves are working. This procedure takes approximately one hour. There are no restrictions for this procedure. Please do NOT wear cologne, perfume, aftershave, or lotions (deodorant is allowed). Please arrive 15 minutes prior to your appointment time.  Please note: We ask at that you not bring children with you during ultrasound (echo/ vascular) testing. Due to room size and safety concerns, children are not allowed in the ultrasound rooms during exams. Our front office staff cannot provide observation of children in our lobby area while testing is being conducted. An adult accompanying a patient to their appointment will only be allowed in the ultrasound room at the discretion of the ultrasound technician under special circumstances. We apologize for any inconvenience.   Follow-Up: At [facility], you and your health needs are our priority.  As part of our continuing mission to provide you with exceptional heart care, we have created designated Provider Care Teams.  These Care Teams include your primary Cardiologist (physician) and Advanced Practice Providers (APPs -  Physician Assistants and Nurse Practitioners)  who all work together to provide you with the care you need, when you need it.  We recommend signing up for the patient portal called "MyChart".  Sign up information is provided on this After Visit Summary.  MyChart is used to connect with patients for Virtual Visits (Telemedicine).  Patients are able to view lab/test results, encounter notes, upcoming appointments, etc.  Non-urgent messages can be sent to your provider as well.   To learn more about what you can do with MyChart, go to [email or web address].    Your next appointment:   3 month(s)  Provider:   Marjie Skiff, PA-C        Other Instructions

## 2023-07-19 NOTE — Progress Notes (Signed)
Cardiology Office Note:    Date:  07/19/2023   ID:  Stacey Chambers, Stacey Chambers [birth year], MRN [id number]  PCP:  Corliss Blacker, MD   [facility] Providers Cardiologist:  Maisie Fus, MD Electrophysiologist:  Lanier Prude, MD     Referring MD: Lourena Simmonds, Donald Pore, *   No chief complaint on file. HFpEF, High grade AV block  History of Present Illness:    Stacey Chambers is a 73 y.o. female with a hx of  ESRD, etoh cirrhosis, referral from Dr. Alvy Bimler for HFpeF.  In April, she was admitted for seizure-like activity. She was dialyzed. She had high grade AV block with narrow complex escape rhythm. Echo showed no WMA. No indication for PPM/ temp pacer. Likely in the setting of ESRD.  Recent negative MM panel Today, she comes in for FU. She makes some urine. No LE edema, PND or orthopnea. No prior cardiac dx hx. No smoking She is from [place]. She cooks during the day and cleans her house. She can get SOB with activity, no CP She uses albuterol   Interim 06/05/2023 When I last saw the patient in June she had prior complete heart block in the setting of hyperkalemia.  She followed up with the EP in July and noted that she was not feeling well, EMS was called and she was found to be in complete heart block again.  She is managed with a dopamine drip.  She underwent temporary pacemaker via the right internal jugular. On 01/15/2023 she underwent a successful Medtronic Micra leadless pacemaker. Notes she has pain in her fistula.   Interim 07/19/2023 She is concerned she can feel her vessels in her neck and SOB. She told her PCP, who told her to come here. Her dialysis is going well. They are able to take off her fluid.  She has just noticed persistent pulsation as well as shortness of breath.  She also states that she cannot sleep on her left side due to discomfort in the neck.   Current Medications: Current Outpatient Medications on File Prior to Visit   Medication Sig Dispense Refill   acetaminophen (TYLENOL) 500 MG tablet Take 500 mg by mouth every 8 (eight) hours as needed for moderate pain.     albuterol (VENTOLIN HFA) 108 (90 Base) MCG/ACT inhaler Inhale 1-2 puffs into the lungs every 6 (six) hours as needed for wheezing or shortness of breath.     amLODipine (NORVASC) 5 MG tablet Take 1 tablet (5 mg total) by mouth daily.     Glycerin-Hypromellose-PEG 400 (VISINE DRY EYE OP) Apply 1 drop to eye 2 (two) times daily as needed (irritation).     ketorolac (ACULAR) 0.5 % ophthalmic solution Place 1 drop into the right eye 4 (four) times daily.     LOKELMA 10 g PACK packet Take 10 g by mouth daily.     metoprolol succinate (TOPROL-XL) 25 MG 24 hr tablet Take 1 tablet (25 mg total) by mouth daily. 30 tablet 0   mirtazapine (REMERON) 15 MG tablet Take 15 mg by mouth See admin instructions. Take 1 tablet by mouth nightly on non-dialysis days     sevelamer carbonate (RENVELA) 800 MG tablet Take 1 tablet (800 mg total) by mouth 3 (three) times daily with meals. (Patient taking differently: Take 1,600 mg by mouth 3 (three) times daily with meals.) 90 tablet 1   No current facility-administered medications on file prior to visit.      Family History:  The patient's family history includes Diabetes in her brother. There is no history of Colon cancer, Esophageal cancer, Stomach cancer, Pancreatic cancer, Inflammatory bowel disease, Liver disease, Rectal cancer, or Colon polyps. No premature CAD  ROS:   Please see the history of present illness.     All other systems reviewed and are negative.  EKGs/Labs/Other Studies Reviewed:    The following studies were reviewed today:  09/16/2022-TTE: EF 60-65%, RV fxn is normal, mild PHTN 41 mmHg, elevated EDP, small pericardial effusion. She has had multiple repeat echoes; most recent shows no significant changes  EKG:  EKG is  ordered today.  The ekg ordered today demonstrates   12/19/2022- NSR,  RBBB  Recent Labs: 04/22/2023: B Natriuretic Peptide 2,066.2 04/23/2023: TSH 6.326 04/24/2023: ALT 16; BUN 37; Creatinine, Ser 6.25; Hemoglobin 8.0; Magnesium 2.6; Platelets 128; Potassium 4.2; Sodium 135   Recent Lipid Panel    Component Value Date/Time   CHOL 144 03/30/2022 1327   TRIG 137 10/27/2022 0700   HDL 61.60 03/30/2022 1327   CHOLHDL 2 03/30/2022 1327   VLDL 21.2 03/30/2022 1327   LDLCALC 62 03/30/2022 1327     Risk Assessment/Calculations:     Physical Exam:    VS:   Vitals:   07/19/23 0837  BP: (!) 130/0  Pulse: 94  SpO2: 97%   Diastolic pressure was unable to be obtained; getting a stat echo today  Wt Readings from Last 3 Encounters:  06/05/23 141 lb (64 kg)  05/02/23 141 lb (64 kg)  04/24/23 135 lb 12.9 oz (61.6 kg)     GEN:  Well nourished, well developed in no acute distress HEENT: Normal NECK: No JVD; CARDIAC: RRR, SEM RUSB, rubs, gallops RESPIRATORY:  Clear to auscultation without rales, wheezing or rhonchi  ABDOMEN: Soft, non-tender, non-distended MUSCULOSKELETAL:  No edema; No deformity  SKIN: Warm and dry NEUROLOGIC:  Alert and oriented x 3 PSYCHIATRIC:  Normal affect   ASSESSMENT:   Neck Pulsation Her fluid is managed by her dialysis which is going well.  She had neck discomfort , discussed likely MSK With SOB /JVD will schedule an urgent TTE to assess if pericardial fluid has returned.  She has history of significant pericardial effusion status post pericardiocentesis on 04/12/2023  CHB: Status post micra leadless pacemaker. Will reschedule visit to review transmissions.   HFpEF/ESRD MWF:  Normal LV function, small pericardial effusion UF per IHD  HTN: well controlled. Continue norvasc 5 mg daily. Continue BB  DM2?hx: A1c 4.3%  Pain in her fistula Has a good thrill. No signs of infection.  LUE duplex was normal.   PLAN:    In order of problems listed above:    Per above will assess for possible pericardial effusion  reaccumulation Follow up with an APP in  3 months           Medication Adjustments/Labs and Tests Ordered: Current medicines are reviewed at length with the patient today.  Concerns regarding medicines are outlined above.  No orders of the defined types were placed in this encounter.  No orders of the defined types were placed in this encounter.   There are no Patient Instructions on file for this visit.   Signed, Maisie Fus, MD  07/19/2023 8:31 AM    [facility]

## 2023-07-24 ENCOUNTER — Ambulatory Visit (HOSPITAL_COMMUNITY): Payer: 59 | Attending: Internal Medicine

## 2023-07-24 DIAGNOSIS — I3139 Other pericardial effusion (noninflammatory): Secondary | ICD-10-CM | POA: Insufficient documentation

## 2023-07-24 LAB — ECHOCARDIOGRAM LIMITED
Area-P 1/2: 3.81 cm2
S' Lateral: 3.1 cm

## 2023-07-26 ENCOUNTER — Telehealth: Payer: Self-pay | Admitting: Internal Medicine

## 2023-07-26 NOTE — Telephone Encounter (Signed)
I called Mrs. Key with an interpreter to discuss her echo findings.  She has significant TR which is new.  I would like to get a TEE for her.  I instructed her to call our office back, so I can discuss this procedure with her.

## 2023-10-07 NOTE — Progress Notes (Signed)
 Cardiology Office Note:    Date:  10/18/2023   ID:  Stacey, Chambers 07-Oct-1950, MRN [id number]  PCP:  Olene Berne, MD  Cardiologist:  Bridgette Campus, MD Electrophysiologist:  Boyce Byes, MD     Referring MD: Alica Antu, Billie Budge, *   Chief Complaint: follow-up of CHF and pericardial effusion  History of Present Illness:    Stacey Chambers is a 73 y.o. female with a history of chronic HFpEF, pericardial effusion with cardiac tamponade in 04/2023, paroxysmal atrial fibrillation not on anticoagulation, complete heart block s/p micra leadless PPM in 01/2023, hypertension, type 2 diabetes mellitus, hypothyroidism, alcoholic cirrhosis, ESRD on M/W/F, anemia, and thrombocytopenia, who is followed by Dr. Alois Arnt and Dr. Marven Slimmer and presents today for follow-up of CHF and pericardial effusion.   Patient was admitted in 10/2022 for seizure like activity that occurred during dialysis. En route to the ED, she reportedly had about 20 seconds of asystole but chest compressions were not started. Heart rate returned but she was bradycardia and required Atropine. EKG in the ED showed complete heart block with ventricular rate of 33 bpm. This was in the setting of hyperkalemia with potassium of 6.3. Complete heart block resolved with dialysis and no pacemaker was required at that time. She was readmitted in 01/2023 for complete heart block after presenting with shortness of breath and near syncope. She underwent placement of a temporary pacemaker and then ultimately had a Medtronic Micra leadless pacemaker placed.   She was admitted in 04/2023 with new onset atrial fibrillation. Echo showed LVEF of 60-65% with normal wall motion and grade 1 diastolic dysfunction, normal RV function, mild MR, and a large pericardial effusion with signs of cardiac tamponade. She underwent pericardiocentesis with removal of 400cc of dark bloody fluid. She converted back to sinus rhythm on her  own. Given short lived episode of atrial fibrillation in setting of cardiac tamponade as well as bloody pericardial effusion and anemia, decision was made to hold off on starting anticoagulation. She had multiple limited Echo prior to discharge which showed stable small to moderate pericardial effusion. She was readmitted 4 days after discharge for community acquired pneumonia.   She was last seen by Dr. Alois Arnt in 07/2023 at which time she was concerned that she could feel her vessels in her neck and she was short of breath. She also reported neck discomfort which was felt to be musculoskeletal in nature. Urgent Echo was ordered to reassess pericardial effusion given shortness of breath and JVD and showed LVEF of 65-70% with mild MR, severe TR, trivial pericardial effusion, and a large left pleural effusion.  Patient presents today for follow-up. Here today with in-person [name] interpreter. From a cardiac standpoint she is doing well, she denies any any chest pain, shortness of breath, orthopnea, PND, edema. She reports intermittent palpitations for which she takes PRN Toprol-XL but states she only requires this rarely. She denies any lightheadedness, dizziness, or syncope.   She states the pulsatile feeling in her neck that she was complaining about last time has resolved. However, she does report intermittent numbness in her left arm and states it feel like her arm falls asleep. This mostly occurs a night, but she also reports a couple of episodes during the day. She does have a history of lumbar spine issues but she denies any cervical neck issues. She also states during one of her dialysis sessions last week, she lost her vision for 20-25 minutes and states  she couldn't hear right for 2 days. I asked if her BP dropped really low during dialysis, and she states she was not told that. No current stroke like symptoms.   EKGs/Labs/Other Studies Reviewed:    The following studies were  reviewed:  Limited Echocardiogram 07/24/2023: Impressions: 1. Left ventricular ejection fraction, by estimation, is 65 to 70%. The  left ventricle has normal function. The left ventricle has no regional  wall motion abnormalities. Indeterminate diastolic filling due to E-A  fusion.   2. Large pleural effusion in the right lateral region.   3. Mild mitral valve regurgitation.   4. Tricuspid valve regurgitation is severe.   5. The aortic valve has an indeterminant number of cusps. There is  moderate calcification of the aortic valve. Aortic valve regurgitation is  not visualized. Aortic valve sclerosis is present, with no evidence of  aortic valve stenosis.   6. There is mildly elevated pulmonary artery systolic pressure.   7. The inferior vena cava is normal in size with <50% respiratory  variability, suggesting right atrial pressure of 8 mmHg.    EKG:  EKG not ordered today. E  Recent Labs: 04/22/2023: B Natriuretic Peptide 2,066.2 04/23/2023: TSH 6.326 04/24/2023: ALT 16; BUN 37; Creatinine, Ser 6.25; Hemoglobin 8.0; Magnesium 2.6; Platelets 128; Potassium 4.2; Sodium 135  Recent Lipid Panel    Component Value Date/Time   CHOL 144 03/30/2022 1327   TRIG 137 10/27/2022 0700   HDL 61.60 03/30/2022 1327   CHOLHDL 2 03/30/2022 1327   VLDL 21.2 03/30/2022 1327   LDLCALC 62 03/30/2022 1327    Physical Exam:    Vital Signs: BP 126/60   Pulse 84   Ht 4\' 11"  (1.499 m)   Wt 157 lb (71.2 kg)   BMI 31.71 kg/m     Wt Readings from Last 3 Encounters:  10/18/23 157 lb (71.2 kg)  07/19/23 145 lb (65.8 kg)  06/05/23 141 lb (64 kg)     General: 72 y.o. female in no acute distress. HEENT: Normocephalic and atraumatic. Sclera clear.  Neck: Supple. No carotid bruits.  Heart: RRR. III/VI systolic murmur.  Lungs: No increased work of breathing. Clear to ausculation bilaterally. No wheezes, rhonchi, or rales.  Extremities: No lower extremity edema.   Skin: Warm and dry. Neuro: No  focal deficits. Psych: Normal affect. Responds appropriately.  Assessment:    1. Chronic heart failure with preserved ejection fraction (HFpEF) (HCC)   2. History of pericardial effusion   3. Paroxysmal atrial fibrillation (HCC)   4. Complete heart block (HCC)   5. Mild mitral regurgitation   6. Severe tricuspid regurgitation   7. Primary hypertension   8. ESRD (end stage renal disease) (HCC)   9. Hyperkalemia   10. Left arm numbness   11. Transient vision loss     Plan:    Chronic HFpEF Last Echo in 07/2023 showed LVEF of 65-70% with mild MR, severe TR, trivial pericardial effusion, and a large left pleural effusion. - Euvolemic on exam.  - Volume status is managed via dialysis.   Of note, a large left pleural effusion was noted on recent Echo. However, she denies any shortness of breath and has no decreased breath sounds on exam. Therefore, do not think any chest x-ray is needed at this time.   History of Pericardial Effusion with Cardiac Tamponade Patient was admitted in 04/2023 and found to have a large pericardial effusion with evidence of cardiac tamponade. S/p pericardiocentesis at that time. Most recent Echo  in 07/2023 showed only trivial pericardial effusion.   Paroxysmal Atrial Fibrillation Found to be in atrial fibrillation in 04/2023 during admission for cardiac tamponade. She converted to sinus rhythm on her own. Given short lived episode of atrial fibrillation in setting of cardiac tamponade as well as bloody pericardial effusion and anemia, decision was made to hold off on starting anticoagulation.  - Maintaining sinus rhythm on exam.  - She describes occasional palpitations but sounds like this is rare. - She has Toprol-XL 25mg  daily listed under medication list but states she is only taking this PRN. Okay to continue to only take PRN for palpitations.  - Not on anticoagulation as above.  Of note, patient does describes some neurologic/ stroke like symptoms as below  recently. With occasional palpitations, questions whether she could be having recurrent episodes of atrial fibrillation. She has a PPM and is schedule to have an in-person device check on 11/01/2023. However, will reach out to device team and see if it is possible to do a remote check before then to look for atrial fibrillation.  Complete Heart Block s/p Leadless PPM S/p Medtronic Micra leadless PPM in 01/2023.  - She has not had her device checked since 01/2023. However, is scheduled to have it checked on 11/01/2023.  - Followed by Dr. Marven Slimmer.   Mild Mitral Regurgitation Severe Tricuspid Regurgitation Recent Echo in 07/2023 showed normal LV function with mild MR and severe TR. No mention of RV dysfunction on report. - No signs of volume overload.  - Will continue to monitor.   Hypertension BP well controlled.  - Continue Amlodipine 5mg  daily.  ESRD  Hyperkalemia On dialysis on M/W/F and daily Lokelma. - Management per Nephrology.  Intermittent Left Arm Numbness Transient Vision Loss Patient reports intermittent left arm numbness and feeling like her arm is asleep for the last few weeks. This is mostly at night but she does report a couple of episodes during the day as well. She does have a history of lumbar spine issues but no known cervical neck issues. She also reports an episode of vision loss last week during dialysis and states she completely lost her vision for 20-25 minutes. Unclear whether this was due to a significant drop in her BP or not. However, she does have a history of atrial fibrillation during hospitalization in 04/2023 and is not on anticoagulation so cannot rule out TIA.  - No current neurologic symptoms. - Will see if we can get an interrogation of her PPM to look for recurrent atrial fibrillation as above.  - I was going to order head imaging but she is actually scheduled to see her PCP early neck week. Therefore, encouraged patient to discuss this with PCP at that visit  to see whether any head imaging or referral to Neurology is needed.  - Emphasized the importance of going to the ED if she has any recurrent stroke like symptoms.   Disposition: Follow up in 4 months.    Signed, Casimer Clear, PA-C  10/18/2023 10:16 AM    [facility]

## 2023-10-18 ENCOUNTER — Encounter: Payer: Self-pay | Admitting: Student

## 2023-10-18 ENCOUNTER — Ambulatory Visit: Payer: 59 | Attending: Student | Admitting: Student

## 2023-10-18 VITALS — BP 126/60 | HR 84 | Ht 59.0 in | Wt 157.0 lb

## 2023-10-18 DIAGNOSIS — N186 End stage renal disease: Secondary | ICD-10-CM

## 2023-10-18 DIAGNOSIS — I071 Rheumatic tricuspid insufficiency: Secondary | ICD-10-CM

## 2023-10-18 DIAGNOSIS — H547 Unspecified visual loss: Secondary | ICD-10-CM

## 2023-10-18 DIAGNOSIS — I480 Paroxysmal atrial fibrillation: Secondary | ICD-10-CM | POA: Diagnosis not present

## 2023-10-18 DIAGNOSIS — I340 Nonrheumatic mitral (valve) insufficiency: Secondary | ICD-10-CM

## 2023-10-18 DIAGNOSIS — I10 Essential (primary) hypertension: Secondary | ICD-10-CM

## 2023-10-18 DIAGNOSIS — I5032 Chronic diastolic (congestive) heart failure: Secondary | ICD-10-CM | POA: Diagnosis not present

## 2023-10-18 DIAGNOSIS — E875 Hyperkalemia: Secondary | ICD-10-CM

## 2023-10-18 DIAGNOSIS — R200 Anesthesia of skin: Secondary | ICD-10-CM

## 2023-10-18 DIAGNOSIS — I3139 Other pericardial effusion (noninflammatory): Secondary | ICD-10-CM | POA: Diagnosis not present

## 2023-10-18 DIAGNOSIS — I442 Atrioventricular block, complete: Secondary | ICD-10-CM | POA: Diagnosis not present

## 2023-10-18 MED ORDER — METOPROLOL SUCCINATE ER 25 MG PO TB24: 25.0000 mg | ORAL_TABLET | Freq: Every day | ORAL | 4 refills | Status: DC | PRN

## 2023-10-18 NOTE — Patient Instructions (Addendum)
 Medication Instructions:  TAKE METOPROLOL AS NEEDED BASIS *If you need a refill on your cardiac medications before your next appointment, please call your pharmacy*  Lab Work:  Testing/Procedures: NONE   NONE  Follow-Up: At [facility], you and your health needs are our priority.  As part of our continuing mission to provide you with exceptional heart care, our providers are all part of one team.  This team includes your primary Cardiologist (physician) and Advanced Practice Providers or APPs (Physician Assistants and Nurse Practitioners) who all work together to provide you with the care you need, when you need it.  Your next appointment:   4 month(s)  Provider:   Bridgette Campus, MD or Sharren Decree, PA-C          We recommend signing up for the patient portal called "MyChart".  Sign up information is provided on this After Visit Summary.  MyChart is used to connect with patients for Virtual Visits (Telemedicine).  Patients are able to view lab/test results, encounter notes, upcoming appointments, etc.  Non-urgent messages can be sent to your provider as well.   To learn more about what you can do with MyChart, go to [email or web address].   Other Instructions DISCUSS YOUR NEURO AND VISION SYMPTOMS WITH YOUR PRIMARY MD AT YOUR UPCOMING APPOINTMENT-HABLE SOBRE SUS SNTOMAS NEURONALES Y DE VISIN CON SU MDICO DE ATENCIN PRIMARIA EN SU PRXIMA CITA  IMMEDIATELY GO TO THE ER WITH ANY STROKE-LIKE SYMPTOMS (EX:  sudden weakness or numbness on one side of the body, difficulty speaking, and vision changes)-ACUDA INMEDIATAMENTE A LA SALA DE EMERGENCIAS SI PRESENTA CUALQUIER SNTOMA SIMILAR A UN ACCIDENTE CEREBROVASCULAR (POR EJEMPLO: debilidad repentina o entumecimiento en un lado del cuerpo, dificultad para hablar y cambios en la visin).  DISCUSS PACEMAKER DOWNLOADS AT YOUR UPCOMING APPOINTMENT CAN YOU DOWNLOAD AT HOME?-HABLE SOBRE DESCARGAS DE MARCAPASOS EN SU PRXIMA CITA PUEDE  DESCARGAR EN CASA?      1st Floor: - Lobby - Registration  - Pharmacy  - Lab - Cafe  2nd Floor: - PV Lab - Diagnostic Testing (echo, CT, nuclear med)  3rd Floor: - Vacant  4th Floor: - TCTS (cardiothoracic surgery) - AFib Clinic - Structural Heart Clinic - Vascular Surgery  - Vascular Ultrasound  5th Floor: - [facility] Cardiology (general and EP) - Clinical Pharmacy for coumadin, hypertension, lipid, weight-loss medications, and med management appointments    Valet parking services will be available as well.

## 2023-10-19 ENCOUNTER — Other Ambulatory Visit: Payer: Self-pay | Admitting: Family Medicine

## 2023-10-19 DIAGNOSIS — R519 Headache, unspecified: Secondary | ICD-10-CM

## 2023-11-01 ENCOUNTER — Ambulatory Visit: Attending: Cardiology

## 2023-11-01 ENCOUNTER — Ambulatory Visit: Payer: 59

## 2023-11-01 DIAGNOSIS — I442 Atrioventricular block, complete: Secondary | ICD-10-CM

## 2023-11-01 LAB — CUP PACEART INCLINIC DEVICE CHECK
Date Time Interrogation Session: 20250501103116
Implantable Pulse Generator Implant Date: 20240715

## 2023-11-01 NOTE — Progress Notes (Signed)
 Leadless pacemaker check in clinic. Please see scanned/attached report.  Presenting rhythm: VS 77. Longevity >10 years. VS 88.5%, AM-VP 10.9%,  VP 0.3%.   Implanted for heart block.  Pt agreeable to remote monitoring.  Pt entered into [facility] and monitor tested with remote transmission received.  Follow up remote checks scheduled.

## 2023-11-01 NOTE — Patient Instructions (Addendum)
 I will get your remote transmission scheduled and call you to let you know what day.  You will follow up with the doctor in 6 months.  I will call you to schedule that appointment.  Programar tu transmisin remota y te llamar para Electronics engineer.  Tendrs una cita de seguimiento con el mdico dentro de 6 meses. Te llamar para programar la cita.

## 2023-11-06 ENCOUNTER — Encounter: Payer: Self-pay | Admitting: Family Medicine

## 2023-11-06 ENCOUNTER — Ambulatory Visit (HOSPITAL_BASED_OUTPATIENT_CLINIC_OR_DEPARTMENT_OTHER): Admitting: Physical Therapy

## 2023-11-08 ENCOUNTER — Other Ambulatory Visit

## 2023-11-13 NOTE — Progress Notes (Signed)
 error

## 2023-11-20 ENCOUNTER — Other Ambulatory Visit: Payer: Self-pay

## 2023-11-20 ENCOUNTER — Ambulatory Visit (HOSPITAL_BASED_OUTPATIENT_CLINIC_OR_DEPARTMENT_OTHER): Attending: Family Medicine | Admitting: Physical Therapy

## 2023-11-20 ENCOUNTER — Encounter (HOSPITAL_BASED_OUTPATIENT_CLINIC_OR_DEPARTMENT_OTHER): Payer: Self-pay | Admitting: Physical Therapy

## 2023-11-20 DIAGNOSIS — R2689 Other abnormalities of gait and mobility: Secondary | ICD-10-CM | POA: Insufficient documentation

## 2023-11-20 DIAGNOSIS — R29898 Other symptoms and signs involving the musculoskeletal system: Secondary | ICD-10-CM | POA: Diagnosis present

## 2023-11-20 DIAGNOSIS — R2681 Unsteadiness on feet: Secondary | ICD-10-CM | POA: Insufficient documentation

## 2023-11-20 DIAGNOSIS — M5459 Other low back pain: Secondary | ICD-10-CM | POA: Diagnosis present

## 2023-11-20 NOTE — Therapy (Signed)
 OUTPATIENT PHYSICAL THERAPY THORACOLUMBAR EVALUATION   Patient Name: Stacey Chambers MRN: [id number] DOB:[birth year], 73 y.o., female Today's Date: 11/20/2023  END OF SESSION:  PT End of Session - 11/20/23 1007     Visit Number 1    Number of Visits 16    Date for PT Re-Evaluation 01/15/24    Authorization Type [employer] MEDICARE    Progress Note Due on Visit 10    PT Start Time 1015    PT Stop Time 1056    PT Time Calculation (min) 41 min    Activity Tolerance Patient tolerated treatment well    Behavior During Therapy WFL for tasks assessed/performed             Past Medical History:  Diagnosis Date   Anemia    Cirrhosis (HCC)    Diabetes mellitus without complication (HCC)    ESRD (end stage renal disease) on dialysis (HCC)    Hypertension    Paroxysmal atrial fibrillation (HCC)    Type 2 diabetes mellitus (HCC)    Vision loss    Past Surgical History:  Procedure Laterality Date   AV FISTULA PLACEMENT Left 12/23/2021   Procedure: INSERTION OF LEFT ARM BRACHIOCEPHALIC ARTERIOVENOUS FISTULA;  Surgeon: Young Hensen, MD;  Location: [facility] OR;  Service: Vascular;  Laterality: Left;   BACK SURGERY     COLONOSCOPY     IR FLUORO GUIDE CV LINE RIGHT  12/16/2021   IR US  GUIDE VASC ACCESS RIGHT  12/16/2021   PACEMAKER LEADLESS INSERTION N/A 01/15/2023   Procedure: PACEMAKER LEADLESS INSERTION;  Surgeon: Boyce Byes, MD;  Location: [facility] INVASIVE CV LAB;  Service: Cardiovascular;  Laterality: N/A;   PERICARDIOCENTESIS N/A 04/12/2023   Procedure: PERICARDIOCENTESIS;  Surgeon: Kyra Phy, MD;  Location: [facility] INVASIVE CV LAB;  Service: Cardiovascular;  Laterality: N/A;   TEMPORARY PACEMAKER N/A 01/13/2023   Procedure: TEMPORARY PACEMAKER;  Surgeon: Kyra Phy, MD;  Location: [facility] INVASIVE CV LAB;  Service: Cardiovascular;  Laterality: N/A;   UPPER GASTROINTESTINAL ENDOSCOPY     Patient Active Problem List   Diagnosis Date Noted   CAP (community  acquired pneumonia) 04/23/2023   Generalized weakness 04/23/2023   Chronic hyponatremia 04/23/2023   Anemia of chronic disease 04/23/2023   Acute left flank pain 04/19/2023   Status post pericardiocentesis 04/14/2023   S/P placement of leadless cardiac pacemaker 04/14/2023   Pericardial effusion 04/12/2023   Chronic diastolic CHF (congestive heart failure) (HCC) 04/12/2023   Rapid atrial fibrillation (HCC) 04/12/2023   Paroxysmal atrial fibrillation (HCC) 04/12/2023   New onset atrial fibrillation (HCC) 04/11/2023   Near syncope 01/17/2023   Essential hypertension 01/15/2023   End-stage renal disease on hemodialysis (HCC) 01/15/2023   CHB (complete heart block) (HCC) 01/13/2023   Bradycardia 10/20/2022   Shock (HCC) 10/20/2022   Fluid overload 09/16/2022   Lumbar post-laminectomy syndrome 07/12/2022   Other chronic pain 07/12/2022   Sacrococcygeal disorders, not elsewhere classified 07/12/2022   Vertebrogenic low back pain 07/12/2022   Hypertension associated with diabetes (HCC) 03/30/2022   End stage renal disease on dialysis (HCC) 03/30/2022   Liver disease, chronic, with cirrhosis (HCC) 12/13/2021   Acute diastolic CHF (congestive heart failure) (HCC) 12/10/2021   COVID-19 virus infection 12/10/2021   Hypertensive urgency 12/10/2021   Diabetes mellitus type 2, controlled, without complications (HCC) 12/10/2021   Acute renal failure superimposed on stage 3b chronic kidney disease (HCC) 12/10/2021   Nephrotic syndrome 12/10/2021   Normocytic anemia 12/10/2021   Low  serum albumin  12/10/2021   Thrombocytopenia (HCC) 12/10/2021   Cirrhosis (HCC) 12/01/2021   Swelling of lower extremity 12/01/2021   Acute kidney injury superimposed on CKD (HCC) 12/01/2021   DM2 (diabetes mellitus, type 2) (HCC) 12/01/2021   Abnormal LFTs 04/29/2021   Iron  deficiency 04/29/2021   Gastric ulcer 04/29/2021   Splenic lesion 04/29/2021   History of gastric ulcer 03/14/2021   History of  thrombocytopenia 03/14/2021   History of iron  deficiency anemia 03/14/2021   Macrocytosis 03/14/2021    PCP: Olene Berne, MD  REFERRING PROVIDER: Olene Berne, MD  REFERRING DIAG: I50.32 (ICD-10-CM) - Chronic diastolic (congestive) heart failure R26.81 (ICD-10-CM) - Unsteadiness on feet Z95.0 (ICD-10-CM) - Presence of cardiac pacemaker M54.50 (ICD-10-CM) - Low back pain, unspecified  Rationale for Evaluation and Treatment: Rehabilitation  THERAPY DIAG:  Other low back pain  Unsteadiness on feet  Other abnormalities of gait and mobility  Other symptoms and signs involving the musculoskeletal system  ONSET DATE: chronic but worse over last year  SUBJECTIVE:                                                                                                                                                                                           SUBJECTIVE STATEMENT: Patient seen with interpreter. Patient states cramps in her legs. A lot of pain in her back. Takes Tylenol  which helps minimally. She feels weak when she goes for dialysis. She has had lumbar fusion in 2022. Has to take rest breaks with chores. Has to frequently reach for support due to balance. R side of back feels dead. Can't walk for more than 5 minutes. Fell down stairs injuring back requiring back surgery with lumbar fusion.  PERTINENT HISTORY:  pacer, esrd on HD, HTN, DM, alcoholic cirrhosis, afib, lumbar fusion  PAIN:  Are you having pain? Yes: NPRS scale: 9/10 Pain location: low back Pain description: too much pain Aggravating factors: movement Relieving factors: Tylenol   PRECAUTIONS: Fall  WEIGHT BEARING RESTRICTIONS: No  FALLS:  Has patient fallen in last 6 months? No   PLOF: Independent with basic ADLs  PATIENT GOALS: improve posture, decrease pain   OBJECTIVE: (objective measures from initial evaluation unless otherwise dated)  Imaging: XR 10/23/22 Posterior fusion changes  from T12-L5, crossing a severe L3 compression fracture. No change in alignment. No hardware complicating feature. No acute fracture or subluxation. SI joints symmetric and unremarkable.  PATIENT SURVEYS:  Modified Oswestry 44/50   SCREENING FOR RED FLAGS: Bowel or bladder incontinence: No Spinal tumors: No Cauda equina syndrome: No Compression fracture: No Abdominal aneurysm: No  COGNITION: Overall cognitive status: Within functional limits  for tasks assessed     SENSATION: WFL   POSTURE: rounded shoulders, forward head, increased thoracic kyphosis, and flexed trunk   PALPATION: TTP R lumbar paraspinals and glute  LUMBAR ROM:   AROM eval  Flexion 75% limited *  Extension 90% limited *  Right lateral flexion   Left lateral flexion   Right rotation   Left rotation    (Blank rows = not tested) * = pain/symptoms  LOWER EXTREMITY ROM:     Active  Right eval Left eval  Hip flexion    Hip extension    Hip abduction    Hip adduction    Hip internal rotation    Hip external rotation    Knee flexion    Knee extension    Ankle dorsiflexion    Ankle plantarflexion    Ankle inversion    Ankle eversion     (Blank rows = not tested) * = pain/symptoms  LOWER EXTREMITY MMT:    MMT Right eval Left eval  Hip flexion 4+ 4+  Hip extension    Hip abduction 4 4  Hip adduction    Hip internal rotation    Hip external rotation    Knee flexion 4+ 4+  Knee extension 4+ 4+  Ankle dorsiflexion 5 5  Ankle plantarflexion    Ankle inversion    Ankle eversion     (Blank rows = not tested) * = pain/symptoms   FUNCTIONAL TESTS:  5xSTS: not tested today due to severe pain with labored transfers Transfers: slow, labored, bilateral UE support Bed mobility: slow, labored, c/o severe back pain. Requires assist for LE onto plinth and UE assist to sit up  GAIT: Distance walked: 100 feet Assistive device utilized: Single point cane Level of assistance: Modified  independence Comments: very slow, labored, impaired foot clearance  TODAY'S TREATMENT:                                                                                                                              DATE:  11/20/23  Eval, education, and HEP   PATIENT EDUCATION:  Education details: Patient educated on exam findings, POC, scope of PT, HEP, and relevant anatomy. Person educated: Patient Education method: Explanation, Demonstration, and Handouts Education comprehension: verbalized understanding, returned demonstration, verbal cues required, and tactile cues required  HOME EXERCISE PROGRAM: Access Code: [email or web address] URL: [email or web address] Date: 11/20/2023 Prepared by: Debria Fang Layten Aiken  Exercises - Seated Lumbar Flexion Stretch  - 3 x daily - 7 x weekly - 1 sets - 10 reps - 5 second hold - Seated Gluteal Sets  - 3 x daily - 7 x weekly - 10 reps - 5-10 second hold  ASSESSMENT:  CLINICAL IMPRESSION: Patient a 73 y.o. y.o. female who was seen today for physical therapy evaluation and treatment for low back pain and unsteady gait. Patient presents with pain limited deficits in lumbar spine and LE strength, ROM, endurance, activity tolerance, gait, balance, and functional  mobility with ADL. Patient is having to modify and restrict ADL as indicated by outcome measure score as well as subjective information and objective measures which is affecting overall participation. Patient will benefit from skilled physical therapy in order to improve function and reduce impairment.  OBJECTIVE IMPAIRMENTS: Abnormal gait, decreased activity tolerance, decreased balance, decreased endurance, decreased mobility, difficulty walking, decreased ROM, decreased strength, increased muscle spasms, impaired flexibility, improper body mechanics, postural dysfunction, and pain.   ACTIVITY LIMITATIONS: carrying, lifting, bending, standing, squatting, stairs, transfers, locomotion level, and  caring for others  PARTICIPATION LIMITATIONS: meal prep, cleaning, laundry, shopping, community activity, and yard work  PERSONAL FACTORS: Fitness, Time since onset of injury/illness/exacerbation, and 3+ comorbidities:  pacer, esrd on HD, HTN, DM, alcoholic cirrhosis, afib, lumbar fusion are also affecting patient's functional outcome.   REHAB POTENTIAL: Fair    CLINICAL DECISION MAKING: Evolving/moderate complexity  EVALUATION COMPLEXITY: Moderate   GOALS: Goals reviewed with patient? Yes  SHORT TERM GOALS: Target date: 12/18/2023    Patient will be independent with HEP in order to improve functional outcomes. Baseline:  Goal status: INITIAL  2.  Patient will report at least 25% improvement in symptoms for improved quality of life. Baseline:  Goal status: INITIAL    LONG TERM GOALS: Target date: 01/15/2024    Patient will report at least 75% improvement in symptoms for improved quality of life. Baseline:  Goal status: INITIAL  2.  Patient will improve modified oswestry score by at least 5 points in order to indicate improved tolerance to activity. Baseline:  Goal status: INITIAL  3.  Patient will demonstrate grade of 5/5 MMT grade in all tested musculature as evidence of improved strength to assist with stair ambulation and gait.  Baseline:  Goal status: INITIAL  4.  Patient will be able to stand/ambulate for at least 10 minutes for improved ability to complete household chores.  Baseline: 5 minutes Goal status: INITIAL     PLAN:  PT FREQUENCY: 1-2x/week  PT DURATION: 8 weeks  PLANNED INTERVENTIONS: 97164- PT Re-evaluation, 97110-Therapeutic exercises, 97530- Therapeutic activity, 97112- Neuromuscular re-education, 97535- Self Care, 16109- Manual therapy, [phone]- Gait training, [phone]- Orthotic Fit/training, [phone]- Canalith repositioning, [id number]- Aquatic Therapy, [phone]- Splinting, Patient/Family education, Balance training, Stair training, Taping, Dry Needling,  Joint mobilization, Joint manipulation, Spinal manipulation, Spinal mobilization, Scar mobilization, and DME instructions.  PLAN FOR NEXT SESSION: core and glute strength, functional strength and endurance; possibly perform formal balance test   Beather Liming, PT, DPT 11/20/2023, 10:58 AM

## 2023-11-22 ENCOUNTER — Ambulatory Visit
Admission: RE | Admit: 2023-11-22 | Discharge: 2023-11-22 | Disposition: A | Discharge status: Home / Self Care | Source: Ambulatory Visit | Attending: Family Medicine | Admitting: Family Medicine

## 2023-11-22 DIAGNOSIS — R519 Headache, unspecified: Secondary | ICD-10-CM

## 2024-01-03 ENCOUNTER — Encounter: Payer: Self-pay | Admitting: Gastroenterology

## 2024-01-03 ENCOUNTER — Other Ambulatory Visit

## 2024-01-03 ENCOUNTER — Ambulatory Visit: Admitting: Gastroenterology

## 2024-01-03 VITALS — BP 142/66 | HR 76 | Ht <= 58 in | Wt 160.0 lb

## 2024-01-03 DIAGNOSIS — D649 Anemia, unspecified: Secondary | ICD-10-CM | POA: Diagnosis not present

## 2024-01-03 DIAGNOSIS — Z862 Personal history of diseases of the blood and blood-forming organs and certain disorders involving the immune mechanism: Secondary | ICD-10-CM

## 2024-01-03 DIAGNOSIS — K921 Melena: Secondary | ICD-10-CM

## 2024-01-03 DIAGNOSIS — D638 Anemia in other chronic diseases classified elsewhere: Secondary | ICD-10-CM

## 2024-01-03 DIAGNOSIS — N186 End stage renal disease: Secondary | ICD-10-CM | POA: Diagnosis not present

## 2024-01-03 DIAGNOSIS — K7460 Unspecified cirrhosis of liver: Secondary | ICD-10-CM

## 2024-01-03 DIAGNOSIS — K7030 Alcoholic cirrhosis of liver without ascites: Secondary | ICD-10-CM

## 2024-01-03 DIAGNOSIS — Z8711 Personal history of peptic ulcer disease: Secondary | ICD-10-CM

## 2024-01-03 DIAGNOSIS — K625 Hemorrhage of anus and rectum: Secondary | ICD-10-CM

## 2024-01-03 DIAGNOSIS — D696 Thrombocytopenia, unspecified: Secondary | ICD-10-CM

## 2024-01-03 DIAGNOSIS — Z992 Dependence on renal dialysis: Secondary | ICD-10-CM

## 2024-01-03 LAB — IBC + FERRITIN
Ferritin: 517 ng/mL — ABNORMAL HIGH (ref 10.0–291.0)
Iron: 98 ug/dL (ref 42–145)
Saturation Ratios: 36.8 % (ref 20.0–50.0)
TIBC: 266 ug/dL (ref 250.0–450.0)
Transferrin: 190 mg/dL — ABNORMAL LOW (ref 212.0–360.0)

## 2024-01-03 LAB — CBC WITH DIFFERENTIAL/PLATELET
Basophils Absolute: 0 10*3/uL (ref 0.0–0.1)
Basophils Relative: 1 % (ref 0.0–3.0)
Eosinophils Absolute: 0.1 10*3/uL (ref 0.0–0.7)
Eosinophils Relative: 3.3 % (ref 0.0–5.0)
HCT: 34.3 % — ABNORMAL LOW (ref 36.0–46.0)
Hemoglobin: 11.7 g/dL — ABNORMAL LOW (ref 12.0–15.0)
Lymphocytes Relative: 19.5 % (ref 12.0–46.0)
Lymphs Abs: 0.7 10*3/uL (ref 0.7–4.0)
MCHC: 34 g/dL (ref 30.0–36.0)
MCV: 100.9 fl — ABNORMAL HIGH (ref 78.0–100.0)
Monocytes Absolute: 0.4 10*3/uL (ref 0.1–1.0)
Monocytes Relative: 10.3 % (ref 3.0–12.0)
Neutro Abs: 2.4 10*3/uL (ref 1.4–7.7)
Neutrophils Relative %: 65.9 % (ref 43.0–77.0)
Platelets: 67 10*3/uL — ABNORMAL LOW (ref 150.0–400.0)
RBC: 3.4 Mil/uL — ABNORMAL LOW (ref 3.87–5.11)
RDW: 16.2 % — ABNORMAL HIGH (ref 11.5–15.5)
WBC: 3.7 10*3/uL — ABNORMAL LOW (ref 4.0–10.5)

## 2024-01-03 LAB — COMPREHENSIVE METABOLIC PANEL WITH GFR
ALT: 16 U/L (ref 0–35)
AST: 24 U/L (ref 0–37)
Albumin: 4.3 g/dL (ref 3.5–5.2)
Alkaline Phosphatase: 134 U/L — ABNORMAL HIGH (ref 39–117)
BUN: 30 mg/dL — ABNORMAL HIGH (ref 6–23)
CO2: 26 meq/L (ref 19–32)
Calcium: 8.8 mg/dL (ref 8.4–10.5)
Chloride: 99 meq/L (ref 96–112)
Creatinine, Ser: 5.43 mg/dL (ref 0.40–1.20)
GFR: 7.49 mL/min — CL (ref >60.00)
Glucose, Bld: 101 mg/dL — ABNORMAL HIGH (ref 70–99)
Potassium: 4.8 meq/L (ref 3.5–5.1)
Sodium: 138 meq/L (ref 135–145)
Total Bilirubin: 0.7 mg/dL (ref 0.2–1.2)
Total Protein: 8.4 g/dL — ABNORMAL HIGH (ref 6.0–8.3)

## 2024-01-03 LAB — PROTIME-INR
INR: 1.3 ratio — ABNORMAL HIGH (ref 0.8–1.0)
Prothrombin Time: 13.7 s — ABNORMAL HIGH (ref 9.6–13.1)

## 2024-01-03 MED ORDER — ESOMEPRAZOLE MAGNESIUM 40 MG PO CPDR: 40.0000 mg | DELAYED_RELEASE_CAPSULE | Freq: Two times a day (BID) | ORAL | 3 refills | Status: DC

## 2024-01-03 MED ORDER — NA SULFATE-K SULFATE-MG SULF 17.5-3.13-1.6 GM/177ML PO SOLN: 1.0000 | Freq: Once | ORAL | 0 refills | Status: AC

## 2024-01-03 MED ORDER — ESOMEPRAZOLE MAGNESIUM 40 MG PO CPDR: 40.0000 mg | DELAYED_RELEASE_CAPSULE | Freq: Every day | ORAL | 5 refills | Status: DC

## 2024-01-03 NOTE — Progress Notes (Signed)
 Stacey Chambers [id number] 1951-03-30   Chief Complaint: Worsening anemia, BRBPR, cirrhosis  Referring Provider: Sigrid Chambers, [place], * Primary GI MD: Dr. Wilhelmenia  HPI: Stacey Chambers is a 73 y.o. female with past medical history of iron  deficiency anemia, cirrhosis, T2DM, ESRD, HTN, HLD, paroxysmal A-fib not on anticoagulation, HFpEF, pericardial effusion with cardiac tamponade 04/2023, complete heart block s/p pacemaker placement 01/2023, PUD, hiatal hernia, hemorrhoids who presents today for a complaint of BRBPR, worsening anemia, and cirrhosis.    Patient last seen in the office 04/29/2021 by Dr. Wilhelmenia for management of chronic gastric ulcer, iron  deficiency anemia, abnormal LFTs.  At time of last visit she was doing well on PPI therapy in treating her gastric ulcer and dyspepsia.  Had some iron  deficiency without overt anemia and was not able to initiate oral iron  supplementation.  Plan was to initiate oral iron  supplementation and if iron  deficiency persisted to consider colonoscopy evaluation at the time of follow-up endoscopy for monitoring of gastric ulcer.  She underwent EGD/colonoscopy 09/13/2021 with finding of nonbleeding gastric ulcer and gastritis.  She had not been taking Nexium .  Was advised to restart this at 40 mg twice daily.  Colonoscopy prep was inadequate and it was recommended that patient have repeat colonoscopy in 6-12 months with 2-day prep.  She has not been seen in office since that procedure.   12/01/2021 to 12/27/2021 patient admitted to [facility] after presenting to the ED with lower extremity swelling, elevated blood pressure, fatigue, dyspnea on ambulation.  On further workup she was noted to have new onset decompensated liver cirrhosis with portal hypertension, splenomegaly, moderate volume ascites, AKI on CKD.  COVID screening test was also positive.  GI and nephrology were consulted.   12/13/2021 she underwent liver biopsy and  renal biopsy.   12/16/2021 she underwent tunneled hemodialysis catheter placement by IR and was started on dialysis.   12/23/2021 she underwent left brachiocephalic AV fistula placement by vascular surgery. Acute hepatitis panel was negative. Diagnostic paracentesis was negative for SBP, fluid was transudative in nature. Echo showed EF of 60 to 65%, grade 2 diastolic dysfunction.  No focal lesion on CT scan. AFP normal, negative ANA, AMA, alpha 1 antitrypsin, no evidence of hemochromatosis. Biopsy showed cirrhosis of uncertain etiology. Likely MASH vs alcoholic cirrhosis.  No indication for steroids. Was advised to follow-up with hepatology/GI as an outpatient.  Labs 09/28/2023: Hemoglobin 11.2, hematocrit 33.4, MCV 103, platelets 81 (aggregation of platelets and sample) AST 25, ALT 15, total bilirubin 0.4, alk phos 174, albumin  4.2  Labs 11/28/2023: Hemoglobin 7.7  Labs 12/04/2023: Hemoglobin 8.9, hematocrit 27.4, MCV 111, platelets 104  Per referral note has had worsening anemia with GI bleeding.  Initially having red blood per rectum, later began having melena.   Patient speaks [name] and an interpreter is present for the visit today.  Patient currently denies any rectal bleeding.  She states that she had about 3 days of rectal bleeding with bright red blood in April, followed by 2 weeks of black stools.  States that stools are now back to normal color, brown.  She is having a bowel movement 2-3 times a day and denies diarrhea or constipation.  She denies trouble swallowing but does have difficulty eating since she lacks teeth and had unsuccessful attempt at getting dentures.  She also has end-stage renal disease and is on dialysis 3 days a week (Monday, Wednesday, Friday), and states that her nephrologist has told her to avoid certain foods.  Because of this she feels she is very limited in what she can eat and takes vitamins to supplement her diet.  She denies any acid reflux or  heartburn.  At time of rectal bleeding and melena she was having some epigastric pain but this has resolved.  She is not on a PPI.  States she has a history of heavy alcohol use.  Denies seeing a liver specialist regarding her cirrhosis.  Denies confusion or abdominal swelling.  Previous GI Procedures/Imaging   US  paracentesis 12/01/2021 Successful ultrasound-guided diagnostic paracentesis yielding 180 cc of peritoneal fluid.  RUQ US  12/01/2021 1. Cirrhotic appearance of the liver. 2. Dominant ascites. 3. Gallbladder wall thickening may be artifactual given the gallbladder is shrunken and abdominal ascites are present.  CT A/P 12/01/2021 1. Cirrhotic liver with evidence of portal hypertension including mild splenomegaly and moderate volume ascites. 2. Small layering bilateral pleural effusions with mild dependent bibasilar airspace opacities, which may represent atelectasis and/or pneumonia. 3. Probable small stone in the region of the gallbladder neck with associated gallbladder wall thickening. If there is clinical concern for acute cholecystitis, further evaluation with right upper quadrant ultrasound is recommended. 4. Colonic diverticulosis without evidence of acute diverticulitis. 5. Chronic appearing severe compression fracture of the L3 vertebral body. 6. Aortic Atherosclerosis (ICD10-I70.0).  Colonoscopy 09/13/2021 - Preparation of the colon was inadequate.  - Hemorrhoids found on digital rectal exam.  - Stool in the proximal transverse colon, at the hepatic flexure, in the ascending colon and in the cecum.  - Diverticulosis in the recto- sigmoid colon and in the sigmoid colon.  - Normal mucosa in the rectum, in the recto- sigmoid colon, in the sigmoid colon and in the descending colon.  - Non- bleeding non- thrombosed external and internal hemorrhoids. - Recall 6-12 months with 2-day prep  EGD 09/13/2021 - No gross lesions in esophagus. Z- line irregular, 39 cm from the  incisors.  - 2 cm hiatal hernia.  - Non- bleeding gastric ulcer with a clean ulcer base ( Forrest Class III) . Gastritis throughout. Gastric mapping performed.  - Erythematous duodenopathy in bulb. No gross lesions in the second portion of the duodenum.  MR abdomen 06/29/2021 1. No focal splenic lesion identified. 2. Hepatic steatosis with hepatic morphologic changes suggestive of cirrhosis. 3. Mild splenomegaly with trace abdominal free fluid suggestive of portal hypertension.  EGD 03/17/2021 - No gross lesions in esophagus. Biopsied.  - Z- line irregular, 39 cm from the incisors.  - 1 cm hiatal hernia.  - Non- bleeding gastric ulcer with a clean ulcer base ( Forrest Class III) in antrum. Gastritis throughout - biopsied.  - Erythematous duodenopathy. Biopsied.  Past Medical History:  Diagnosis Date   Anemia    Cirrhosis (HCC)    Diabetes mellitus without complication (HCC)    ESRD (end stage renal disease) on dialysis (HCC)    Hypertension    Paroxysmal atrial fibrillation (HCC)    Type 2 diabetes mellitus (HCC)    Vision loss     Past Surgical History:  Procedure Laterality Date   AV FISTULA PLACEMENT Left 12/23/2021   Procedure: INSERTION OF LEFT ARM BRACHIOCEPHALIC ARTERIOVENOUS FISTULA;  Surgeon: Gretta Lonni PARAS, MD;  Location: [facility] OR;  Service: Vascular;  Laterality: Left;   BACK SURGERY     COLONOSCOPY     IR FLUORO GUIDE CV LINE RIGHT  12/16/2021   IR US  GUIDE VASC ACCESS RIGHT  12/16/2021   PACEMAKER LEADLESS INSERTION N/A 01/15/2023   Procedure: PACEMAKER  LEADLESS INSERTION;  Surgeon: Cindie Ole DASEN, MD;  Location[facility] CV LAB;  Service: Cardiovascular;  Laterality: N/A;   PERICARDIOCENTESIS N/A 04/12/2023   Procedure: PERICARDIOCENTESIS;  Surgeon: Wendel Lurena POUR, MD;  Location: [facility] INVASIVE CV LAB;  Service: Cardiovascular;  Laterality: N/A;   TEMPORARY PACEMAKER N/A 01/13/2023   Procedure: TEMPORARY PACEMAKER;  Surgeon: Wendel Lurena POUR, MD;   Location: [facility] INVASIVE CV LAB;  Service: Cardiovascular;  Laterality: N/A;   UPPER GASTROINTESTINAL ENDOSCOPY      Current Outpatient Medications  Medication Sig Dispense Refill   acetaminophen  (TYLENOL ) 500 MG tablet Take 500 mg by mouth every 8 (eight) hours as needed for moderate pain.     albuterol  (VENTOLIN  HFA) 108 (90 Base) MCG/ACT inhaler Inhale 1-2 puffs into the lungs every 6 (six) hours as needed for wheezing or shortness of breath.     amLODipine  (NORVASC ) 5 MG tablet Take 1 tablet (5 mg total) by mouth daily.     Glycerin-Hypromellose-PEG 400 (VISINE DRY EYE OP) Apply 1 drop to eye 2 (two) times daily as needed (irritation).     ketorolac (ACULAR) 0.5 % ophthalmic solution Place 1 drop into the right eye 4 (four) times daily.     LOKELMA  10 g PACK packet Take 10 g by mouth daily.     metoprolol  succinate (TOPROL -XL) 25 MG 24 hr tablet Take 1 tablet (25 mg total) by mouth daily as needed. 30 tablet 4   mirtazapine  (REMERON ) 15 MG tablet Take 15 mg by mouth See admin instructions. Take 1 tablet by mouth nightly on non-dialysis days     sevelamer  carbonate (RENVELA ) 800 MG tablet Take 1 tablet (800 mg total) by mouth 3 (three) times daily with meals. (Patient taking differently: Take 1,600 mg by mouth 3 (three) times daily with meals.) 90 tablet 1   No current facility-administered medications for this visit.    Allergies as of 01/03/2024 - Review Complete 11/20/2023  Allergen Reaction Noted   Chlorhexidine  Rash 06/13/2023   Fish allergy Itching 04/13/2023    Family History  Problem Relation Age of Onset   Diabetes Brother    Colon cancer Neg Hx    Esophageal cancer Neg Hx    Stomach cancer Neg Hx    Pancreatic cancer Neg Hx    Inflammatory bowel disease Neg Hx    Liver disease Neg Hx    Rectal cancer Neg Hx    Colon polyps Neg Hx     Social History   Tobacco Use   Smoking status: Never   Smokeless tobacco: Never  Vaping Use   Vaping status: Never Used  Substance  Use Topics   Alcohol use: Never    Comment: once in awhile   Drug use: Not Currently    Types: Marijuana    Comment: Use of THC tea 1-2 times per week     Review of Systems:    Constitutional: No unexplained weight loss, fever, chills. Positive fatigue. Skin: No rash or itching Cardiovascular: No chest pain Respiratory: No cough Gastrointestinal: See HPI and otherwise negative Neurological: No headache, dizziness or syncope Musculoskeletal: No new muscle or joint pain Hematologic: No bleeding or bruising    Physical Exam:  Vital signs: BP (!) 142/66   Pulse 76   Ht 4' 10 (1.473 m)   Wt 160 lb (72.6 kg)   BMI 33.44 kg/m    Constitutional: Pleasant, chronically ill-appearing, NAD, alert and cooperative Head:  Normocephalic and atraumatic.  Eyes: No scleral icterus. Conjunctiva  pink. Mouth: No oral lesions.  Respiratory: Respirations even and unlabored. Lungs clear to auscultation bilaterally.  No wheezes, crackles, or rhonchi.  Cardiovascular:  Regular rate and rhythm. No murmurs. No peripheral edema. Gastrointestinal: Patient declined exam on table, examined sitting in chair.  Soft, nondistended, nontender. No rebound or guarding. Normal bowel sounds. No appreciable masses or ascites. Rectal:  Not performed.  Neurologic:  Alert and oriented x4;  grossly normal neurologically.  Negative asterixis. Skin:   Dry and intact without significant lesions or rashes. Psychiatric: Oriented to person, place and time. Demonstrates good judgement and reason without abnormal affect or behaviors.   RELEVANT LABS AND IMAGING: CBC    Component Value Date/Time   WBC 3.9 (L) 04/24/2023 0405   RBC 2.41 (L) 04/24/2023 0405   HGB 8.0 (L) 04/24/2023 0405   HGB 11.1 (L) 12/12/2022 0957   HCT 24.8 (L) 04/24/2023 0405   PLT 128 (L) 04/24/2023 0405   PLT 83 (L) 12/12/2022 0957   MCV 102.9 (H) 04/24/2023 0405   MCH 33.2 04/24/2023 0405   MCHC 32.3 04/24/2023 0405   RDW 14.8 04/24/2023 0405    LYMPHSABS 1.0 04/23/2023 0220   MONOABS 0.7 04/23/2023 0220   EOSABS 0.2 04/23/2023 0220   BASOSABS 0.1 04/23/2023 0220    CMP     Component Value Date/Time   NA 135 04/24/2023 0405   K 4.2 04/24/2023 0405   CL 98 04/24/2023 0405   CO2 24 04/24/2023 0405   GLUCOSE 81 04/24/2023 0405   BUN 37 (H) 04/24/2023 0405   CREATININE 6.25 (H) 04/24/2023 0405   CREATININE 4.04 (H) 12/12/2022 0957   CALCIUM  8.3 (L) 04/24/2023 0405   PROT 7.2 04/24/2023 0405   ALBUMIN  3.1 (L) 04/24/2023 0405   AST 21 04/24/2023 0405   AST 24 12/12/2022 0957   ALT 16 04/24/2023 0405   ALT 11 12/12/2022 0957   ALKPHOS 93 04/24/2023 0405   BILITOT 0.5 04/24/2023 0405   BILITOT 1.2 12/12/2022 0957   GFRNONAA 7 (L) 04/24/2023 0405   GFRNONAA 11 (L) 12/12/2022 0957   Echocardiogram 04/12/2023 1. Left ventricular ejection fraction, by estimation, is 60 to 65% . The left ventricle has normal function. The left ventricle has no regional wall motion abnormalities. Left ventricular diastolic parameters are consistent with Grade I diastolic dysfunction ( impaired relaxation) .  2. Right ventricular systolic function is normal. The right ventricular size is normal. There is normal pulmonary artery systolic pressure.  3. Pericardial effusion measures up to 3. 2 cm. Comapred with the echo 3/ 2024, pericardial effusion is much larger. Echo findings of diastolic RA and RV collapse as well as > 25% respiratory inflow varation are suggestive of cardiac tamponade. The IVC is not dilated, but doesn' t collapse, indicating RA pressure 8 mmHg. Suggest clinical correlation as patient has several features suggesting that she is at risk for tamponade. Large pericardial effusion. The pericardial effusion is circumferential.  4. The mitral valve is normal in structure. Mild mitral valve regurgitation. No evidence of mitral stenosis.  5. The aortic valve was not well visualized. There is mild calcification of the aortic valve. There is  mild thickening of the aortic valve. Aortic valve regurgitation is not visualized. No aortic stenosis is present.  6. The inferior vena cava is normal in size with < 50% respiratory variability, suggesting right atrial pressure of 8 mmHg.  Echocardiogram (limited) 07/24/2023 1. Left ventricular ejection fraction, by estimation, is 65 to 70% . The left ventricle has  normal function. The left ventricle has no regional wall motion abnormalities. Indeterminate diastolic filling due to E- A fusion.  2. Large pleural effusion in the right lateral region.  3. Mild mitral valve regurgitation.  4. Tricuspid valve regurgitation is severe.  5. The aortic valve has an indeterminant number of cusps. There is moderate calcification of the aortic valve. Aortic valve regurgitation is not visualized. Aortic valve sclerosis is present, with no evidence of aortic valve stenosis.  6. There is mildly elevated pulmonary artery systolic pressure.  7. The inferior vena cava is normal in size with < 50% respiratory variability, suggesting right atrial pressure of 8 mmHg.  MELD 3.0: 24 at 04/24/2023  4:05 AM MELD-Na: 24 at 04/24/2023  4:05 AM Calculated from: Serum Creatinine: 6.25 mg/dL (Using max of 3 mg/dL) at 89/77/7975  5:94 AM Serum Sodium: 135 mmol/L at 04/24/2023  4:05 AM Total Bilirubin: 0.5 mg/dL (Using min of 1 mg/dL) at 89/77/7975  5:94 AM Serum Albumin : 3.1 g/dL at 89/77/7975  5:94 AM INR(ratio): 1.4 at 04/23/2023  2:20 AM Age at listing (hypothetical): 20 years Sex: Female at 04/24/2023  4:05 AM  Assessment/Plan:   Cirrhosis of unclear etiology Anemia  Thrombocytopenia ESRD Rectal bleeding Melena  History of gastric ulcer  Patient referred to us  for recent rectal bleeding and melena, with drop in hemoglobin from 11.2 --> 7.7. Hemoglobin up to 8.9 on labs 12/04/2023.   She has previously followed with Dr. Wilhelmenia and underwent EGD/colonoscopy 09/13/2021 with finding of nonbleeding gastric ulcer  and gastritis.  She had not been taking Nexium  and was advised to restart this.  Colonoscopy prep was inadequate and it was recommended that patient have repeat colonoscopy in 6-12 months with 2-day prep. Patient has since been lost to follow-up.  She was hospitalized 12/2021 at which time she was diagnosed with new onset decompensated cirrhosis of unclear etiology (see HPI for more detail).  Today patient states that she had 3 days of rectal bleeding back in April followed by 2 weeks of black stools, all of which have now resolved.  She does continue to feel fatigued.  Has not had care of her cirrhosis.  She is on hemodialysis for ESRD on M, W, F.   Case and plan discussed with Dr. San in office today.   - Will tentatively plan for EGD/Colon with 2-day prep in hospital setting. I thoroughly discussed the procedure with the patient to include nature of the procedure, alternatives, benefits, and risks (including but not limited to bleeding, infection, perforation, anesthesia/cardiac/pulmonary complications). Patient verbalized understanding and gave verbal consent to proceed with procedure.  - Will order abdominal ultrasound for HCC screening - Check labs today: CBC, CMP, PT/INR, AFP, iron , ferritin - Recalculate MELD based on today's labs. Last calculated MELD 24 on 04/2023. - Start Nexium  40mg  daily BID - Refer to liver clinic - Will discuss further recommendations with Dr. San and Dr. Wilhelmenia.   On further chart review it appears patient was seen in consult by [facility] for pretransplant evaluation on 08/04/2022 and it was noted that patient had a liver biopsy 12/09/2021 with diagnosis of cirrhosis of uncertain etiology.  At that time she was deemed not a candidate for kidney transplantation due to lack of social support and not engaged in care for cirrhosis, with ongoing alcohol use.   Camie Furbish, PA-C [facility] 01/03/2024, 8:17 AM  Patient Care  Team: Stacey Chambers, [place], MD as PCP - General (Family Medicine) Alvan Ronal BRAVO, MD (Inactive)  as PCP - Cardiology (Cardiology) Cindie Ole DASEN, MD as PCP - Electrophysiology (Cardiology)

## 2024-01-03 NOTE — Patient Instructions (Addendum)
 Your provider has requested that you go to the basement level for lab work before leaving today. Press B on the elevator. The lab is located at the first door on the left as you exit the elevator.   You have been scheduled for an abdominal ultrasound at [facility] (1st floor of hospital) on 01/15/24 at 9:30 am. Please arrive 30 minutes prior to your appointment for registration. Make certain not to have anything to eat or drink after midnight. Should you need to reschedule your appointment, please contact radiology at [phone]. This test typically takes about 30 minutes to perform.  We have sent the following medications to your pharmacy for you to pick up at your convenience: Nexium  40 mg twice a day  Avoid Nsaids.  Avoid alcohol.  We are referring you to the liver transplant clinic and their office will call you to schedule an appointment.   You have been scheduled for an endoscopy and colonoscopy. Please follow the written instructions given to you at your visit today.  If you use inhalers (even only as needed), please bring them with you on the day of your procedure.  DO NOT TAKE 7 DAYS PRIOR TO TEST- Trulicity (dulaglutide) Ozempic, Wegovy (semaglutide) Mounjaro (tirzepatide) Bydureon Bcise (exanatide extended release)  DO NOT TAKE 1 DAY PRIOR TO YOUR TEST Rybelsus (semaglutide) Adlyxin (lixisenatide) Victoza (liraglutide) Byetta (exanatide) ___________________________________________________________________________

## 2024-01-03 NOTE — H&P (View-Only) (Signed)
 Stacey Chambers [id number] 1951-03-30   Chief Complaint: Worsening anemia, BRBPR, cirrhosis  Referring Provider: Sigrid Kays, [place], * Primary GI MD: Dr. Wilhelmenia  HPI: Stacey Chambers is a 73 y.o. female with past medical history of iron  deficiency anemia, cirrhosis, T2DM, ESRD, HTN, HLD, paroxysmal A-fib not on anticoagulation, HFpEF, pericardial effusion with cardiac tamponade 04/2023, complete heart block s/p pacemaker placement 01/2023, PUD, hiatal hernia, hemorrhoids who presents today for a complaint of BRBPR, worsening anemia, and cirrhosis.    Patient last seen in the office 04/29/2021 by Dr. Wilhelmenia for management of chronic gastric ulcer, iron  deficiency anemia, abnormal LFTs.  At time of last visit she was doing well on PPI therapy in treating her gastric ulcer and dyspepsia.  Had some iron  deficiency without overt anemia and was not able to initiate oral iron  supplementation.  Plan was to initiate oral iron  supplementation and if iron  deficiency persisted to consider colonoscopy evaluation at the time of follow-up endoscopy for monitoring of gastric ulcer.  She underwent EGD/colonoscopy 09/13/2021 with finding of nonbleeding gastric ulcer and gastritis.  She had not been taking Nexium .  Was advised to restart this at 40 mg twice daily.  Colonoscopy prep was inadequate and it was recommended that patient have repeat colonoscopy in 6-12 months with 2-day prep.  She has not been seen in office since that procedure.   12/01/2021 to 12/27/2021 patient admitted to [facility] after presenting to the ED with lower extremity swelling, elevated blood pressure, fatigue, dyspnea on ambulation.  On further workup she was noted to have new onset decompensated liver cirrhosis with portal hypertension, splenomegaly, moderate volume ascites, AKI on CKD.  COVID screening test was also positive.  GI and nephrology were consulted.   12/13/2021 she underwent liver biopsy and  renal biopsy.   12/16/2021 she underwent tunneled hemodialysis catheter placement by IR and was started on dialysis.   12/23/2021 she underwent left brachiocephalic AV fistula placement by vascular surgery. Acute hepatitis panel was negative. Diagnostic paracentesis was negative for SBP, fluid was transudative in nature. Echo showed EF of 60 to 65%, grade 2 diastolic dysfunction.  No focal lesion on CT scan. AFP normal, negative ANA, AMA, alpha 1 antitrypsin, no evidence of hemochromatosis. Biopsy showed cirrhosis of uncertain etiology. Likely MASH vs alcoholic cirrhosis.  No indication for steroids. Was advised to follow-up with hepatology/GI as an outpatient.  Labs 09/28/2023: Hemoglobin 11.2, hematocrit 33.4, MCV 103, platelets 81 (aggregation of platelets and sample) AST 25, ALT 15, total bilirubin 0.4, alk phos 174, albumin  4.2  Labs 11/28/2023: Hemoglobin 7.7  Labs 12/04/2023: Hemoglobin 8.9, hematocrit 27.4, MCV 111, platelets 104  Per referral note has had worsening anemia with GI bleeding.  Initially having red blood per rectum, later began having melena.   Patient speaks [name] and an interpreter is present for the visit today.  Patient currently denies any rectal bleeding.  She states that she had about 3 days of rectal bleeding with bright red blood in April, followed by 2 weeks of black stools.  States that stools are now back to normal color, brown.  She is having a bowel movement 2-3 times a day and denies diarrhea or constipation.  She denies trouble swallowing but does have difficulty eating since she lacks teeth and had unsuccessful attempt at getting dentures.  She also has end-stage renal disease and is on dialysis 3 days a week (Monday, Wednesday, Friday), and states that her nephrologist has told her to avoid certain foods.  Because of this she feels she is very limited in what she can eat and takes vitamins to supplement her diet.  She denies any acid reflux or  heartburn.  At time of rectal bleeding and melena she was having some epigastric pain but this has resolved.  She is not on a PPI.  States she has a history of heavy alcohol use.  Denies seeing a liver specialist regarding her cirrhosis.  Denies confusion or abdominal swelling.  Previous GI Procedures/Imaging   US  paracentesis 12/01/2021 Successful ultrasound-guided diagnostic paracentesis yielding 180 cc of peritoneal fluid.  RUQ US  12/01/2021 1. Cirrhotic appearance of the liver. 2. Dominant ascites. 3. Gallbladder wall thickening may be artifactual given the gallbladder is shrunken and abdominal ascites are present.  CT A/P 12/01/2021 1. Cirrhotic liver with evidence of portal hypertension including mild splenomegaly and moderate volume ascites. 2. Small layering bilateral pleural effusions with mild dependent bibasilar airspace opacities, which may represent atelectasis and/or pneumonia. 3. Probable small stone in the region of the gallbladder neck with associated gallbladder wall thickening. If there is clinical concern for acute cholecystitis, further evaluation with right upper quadrant ultrasound is recommended. 4. Colonic diverticulosis without evidence of acute diverticulitis. 5. Chronic appearing severe compression fracture of the L3 vertebral body. 6. Aortic Atherosclerosis (ICD10-I70.0).  Colonoscopy 09/13/2021 - Preparation of the colon was inadequate.  - Hemorrhoids found on digital rectal exam.  - Stool in the proximal transverse colon, at the hepatic flexure, in the ascending colon and in the cecum.  - Diverticulosis in the recto- sigmoid colon and in the sigmoid colon.  - Normal mucosa in the rectum, in the recto- sigmoid colon, in the sigmoid colon and in the descending colon.  - Non- bleeding non- thrombosed external and internal hemorrhoids. - Recall 6-12 months with 2-day prep  EGD 09/13/2021 - No gross lesions in esophagus. Z- line irregular, 39 cm from the  incisors.  - 2 cm hiatal hernia.  - Non- bleeding gastric ulcer with a clean ulcer base ( Forrest Class III) . Gastritis throughout. Gastric mapping performed.  - Erythematous duodenopathy in bulb. No gross lesions in the second portion of the duodenum.  MR abdomen 06/29/2021 1. No focal splenic lesion identified. 2. Hepatic steatosis with hepatic morphologic changes suggestive of cirrhosis. 3. Mild splenomegaly with trace abdominal free fluid suggestive of portal hypertension.  EGD 03/17/2021 - No gross lesions in esophagus. Biopsied.  - Z- line irregular, 39 cm from the incisors.  - 1 cm hiatal hernia.  - Non- bleeding gastric ulcer with a clean ulcer base ( Forrest Class III) in antrum. Gastritis throughout - biopsied.  - Erythematous duodenopathy. Biopsied.  Past Medical History:  Diagnosis Date   Anemia    Cirrhosis (HCC)    Diabetes mellitus without complication (HCC)    ESRD (end stage renal disease) on dialysis (HCC)    Hypertension    Paroxysmal atrial fibrillation (HCC)    Type 2 diabetes mellitus (HCC)    Vision loss     Past Surgical History:  Procedure Laterality Date   AV FISTULA PLACEMENT Left 12/23/2021   Procedure: INSERTION OF LEFT ARM BRACHIOCEPHALIC ARTERIOVENOUS FISTULA;  Surgeon: Gretta Lonni PARAS, MD;  Location: [facility] OR;  Service: Vascular;  Laterality: Left;   BACK SURGERY     COLONOSCOPY     IR FLUORO GUIDE CV LINE RIGHT  12/16/2021   IR US  GUIDE VASC ACCESS RIGHT  12/16/2021   PACEMAKER LEADLESS INSERTION N/A 01/15/2023   Procedure: PACEMAKER  LEADLESS INSERTION;  Surgeon: Cindie Ole DASEN, MD;  Location[facility] CV LAB;  Service: Cardiovascular;  Laterality: N/A;   PERICARDIOCENTESIS N/A 04/12/2023   Procedure: PERICARDIOCENTESIS;  Surgeon: Wendel Lurena POUR, MD;  Location: [facility] INVASIVE CV LAB;  Service: Cardiovascular;  Laterality: N/A;   TEMPORARY PACEMAKER N/A 01/13/2023   Procedure: TEMPORARY PACEMAKER;  Surgeon: Wendel Lurena POUR, MD;   Location: [facility] INVASIVE CV LAB;  Service: Cardiovascular;  Laterality: N/A;   UPPER GASTROINTESTINAL ENDOSCOPY      Current Outpatient Medications  Medication Sig Dispense Refill   acetaminophen  (TYLENOL ) 500 MG tablet Take 500 mg by mouth every 8 (eight) hours as needed for moderate pain.     albuterol  (VENTOLIN  HFA) 108 (90 Base) MCG/ACT inhaler Inhale 1-2 puffs into the lungs every 6 (six) hours as needed for wheezing or shortness of breath.     amLODipine  (NORVASC ) 5 MG tablet Take 1 tablet (5 mg total) by mouth daily.     Glycerin-Hypromellose-PEG 400 (VISINE DRY EYE OP) Apply 1 drop to eye 2 (two) times daily as needed (irritation).     ketorolac (ACULAR) 0.5 % ophthalmic solution Place 1 drop into the right eye 4 (four) times daily.     LOKELMA  10 g PACK packet Take 10 g by mouth daily.     metoprolol  succinate (TOPROL -XL) 25 MG 24 hr tablet Take 1 tablet (25 mg total) by mouth daily as needed. 30 tablet 4   mirtazapine  (REMERON ) 15 MG tablet Take 15 mg by mouth See admin instructions. Take 1 tablet by mouth nightly on non-dialysis days     sevelamer  carbonate (RENVELA ) 800 MG tablet Take 1 tablet (800 mg total) by mouth 3 (three) times daily with meals. (Patient taking differently: Take 1,600 mg by mouth 3 (three) times daily with meals.) 90 tablet 1   No current facility-administered medications for this visit.    Allergies as of 01/03/2024 - Review Complete 11/20/2023  Allergen Reaction Noted   Chlorhexidine  Rash 06/13/2023   Fish allergy Itching 04/13/2023    Family History  Problem Relation Age of Onset   Diabetes Brother    Colon cancer Neg Hx    Esophageal cancer Neg Hx    Stomach cancer Neg Hx    Pancreatic cancer Neg Hx    Inflammatory bowel disease Neg Hx    Liver disease Neg Hx    Rectal cancer Neg Hx    Colon polyps Neg Hx     Social History   Tobacco Use   Smoking status: Never   Smokeless tobacco: Never  Vaping Use   Vaping status: Never Used  Substance  Use Topics   Alcohol use: Never    Comment: once in awhile   Drug use: Not Currently    Types: Marijuana    Comment: Use of THC tea 1-2 times per week     Review of Systems:    Constitutional: No unexplained weight loss, fever, chills. Positive fatigue. Skin: No rash or itching Cardiovascular: No chest pain Respiratory: No cough Gastrointestinal: See HPI and otherwise negative Neurological: No headache, dizziness or syncope Musculoskeletal: No new muscle or joint pain Hematologic: No bleeding or bruising    Physical Exam:  Vital signs: BP (!) 142/66   Pulse 76   Ht 4' 10 (1.473 m)   Wt 160 lb (72.6 kg)   BMI 33.44 kg/m    Constitutional: Pleasant, chronically ill-appearing, NAD, alert and cooperative Head:  Normocephalic and atraumatic.  Eyes: No scleral icterus. Conjunctiva  pink. Mouth: No oral lesions.  Respiratory: Respirations even and unlabored. Lungs clear to auscultation bilaterally.  No wheezes, crackles, or rhonchi.  Cardiovascular:  Regular rate and rhythm. No murmurs. No peripheral edema. Gastrointestinal: Patient declined exam on table, examined sitting in chair.  Soft, nondistended, nontender. No rebound or guarding. Normal bowel sounds. No appreciable masses or ascites. Rectal:  Not performed.  Neurologic:  Alert and oriented x4;  grossly normal neurologically.  Negative asterixis. Skin:   Dry and intact without significant lesions or rashes. Psychiatric: Oriented to person, place and time. Demonstrates good judgement and reason without abnormal affect or behaviors.   RELEVANT LABS AND IMAGING: CBC    Component Value Date/Time   WBC 3.9 (L) 04/24/2023 0405   RBC 2.41 (L) 04/24/2023 0405   HGB 8.0 (L) 04/24/2023 0405   HGB 11.1 (L) 12/12/2022 0957   HCT 24.8 (L) 04/24/2023 0405   PLT 128 (L) 04/24/2023 0405   PLT 83 (L) 12/12/2022 0957   MCV 102.9 (H) 04/24/2023 0405   MCH 33.2 04/24/2023 0405   MCHC 32.3 04/24/2023 0405   RDW 14.8 04/24/2023 0405    LYMPHSABS 1.0 04/23/2023 0220   MONOABS 0.7 04/23/2023 0220   EOSABS 0.2 04/23/2023 0220   BASOSABS 0.1 04/23/2023 0220    CMP     Component Value Date/Time   NA 135 04/24/2023 0405   K 4.2 04/24/2023 0405   CL 98 04/24/2023 0405   CO2 24 04/24/2023 0405   GLUCOSE 81 04/24/2023 0405   BUN 37 (H) 04/24/2023 0405   CREATININE 6.25 (H) 04/24/2023 0405   CREATININE 4.04 (H) 12/12/2022 0957   CALCIUM  8.3 (L) 04/24/2023 0405   PROT 7.2 04/24/2023 0405   ALBUMIN  3.1 (L) 04/24/2023 0405   AST 21 04/24/2023 0405   AST 24 12/12/2022 0957   ALT 16 04/24/2023 0405   ALT 11 12/12/2022 0957   ALKPHOS 93 04/24/2023 0405   BILITOT 0.5 04/24/2023 0405   BILITOT 1.2 12/12/2022 0957   GFRNONAA 7 (L) 04/24/2023 0405   GFRNONAA 11 (L) 12/12/2022 0957   Echocardiogram 04/12/2023 1. Left ventricular ejection fraction, by estimation, is 60 to 65% . The left ventricle has normal function. The left ventricle has no regional wall motion abnormalities. Left ventricular diastolic parameters are consistent with Grade I diastolic dysfunction ( impaired relaxation) .  2. Right ventricular systolic function is normal. The right ventricular size is normal. There is normal pulmonary artery systolic pressure.  3. Pericardial effusion measures up to 3. 2 cm. Comapred with the echo 3/ 2024, pericardial effusion is much larger. Echo findings of diastolic RA and RV collapse as well as > 25% respiratory inflow varation are suggestive of cardiac tamponade. The IVC is not dilated, but doesn' t collapse, indicating RA pressure 8 mmHg. Suggest clinical correlation as patient has several features suggesting that she is at risk for tamponade. Large pericardial effusion. The pericardial effusion is circumferential.  4. The mitral valve is normal in structure. Mild mitral valve regurgitation. No evidence of mitral stenosis.  5. The aortic valve was not well visualized. There is mild calcification of the aortic valve. There is  mild thickening of the aortic valve. Aortic valve regurgitation is not visualized. No aortic stenosis is present.  6. The inferior vena cava is normal in size with < 50% respiratory variability, suggesting right atrial pressure of 8 mmHg.  Echocardiogram (limited) 07/24/2023 1. Left ventricular ejection fraction, by estimation, is 65 to 70% . The left ventricle has  normal function. The left ventricle has no regional wall motion abnormalities. Indeterminate diastolic filling due to E- A fusion.  2. Large pleural effusion in the right lateral region.  3. Mild mitral valve regurgitation.  4. Tricuspid valve regurgitation is severe.  5. The aortic valve has an indeterminant number of cusps. There is moderate calcification of the aortic valve. Aortic valve regurgitation is not visualized. Aortic valve sclerosis is present, with no evidence of aortic valve stenosis.  6. There is mildly elevated pulmonary artery systolic pressure.  7. The inferior vena cava is normal in size with < 50% respiratory variability, suggesting right atrial pressure of 8 mmHg.  MELD 3.0: 24 at 04/24/2023  4:05 AM MELD-Na: 24 at 04/24/2023  4:05 AM Calculated from: Serum Creatinine: 6.25 mg/dL (Using max of 3 mg/dL) at 89/77/7975  5:94 AM Serum Sodium: 135 mmol/L at 04/24/2023  4:05 AM Total Bilirubin: 0.5 mg/dL (Using min of 1 mg/dL) at 89/77/7975  5:94 AM Serum Albumin : 3.1 g/dL at 89/77/7975  5:94 AM INR(ratio): 1.4 at 04/23/2023  2:20 AM Age at listing (hypothetical): 20 years Sex: Female at 04/24/2023  4:05 AM  Assessment/Plan:   Cirrhosis of unclear etiology Anemia  Thrombocytopenia ESRD Rectal bleeding Melena  History of gastric ulcer  Patient referred to us  for recent rectal bleeding and melena, with drop in hemoglobin from 11.2 --> 7.7. Hemoglobin up to 8.9 on labs 12/04/2023.   She has previously followed with Dr. Wilhelmenia and underwent EGD/colonoscopy 09/13/2021 with finding of nonbleeding gastric ulcer  and gastritis.  She had not been taking Nexium  and was advised to restart this.  Colonoscopy prep was inadequate and it was recommended that patient have repeat colonoscopy in 6-12 months with 2-day prep. Patient has since been lost to follow-up.  She was hospitalized 12/2021 at which time she was diagnosed with new onset decompensated cirrhosis of unclear etiology (see HPI for more detail).  Today patient states that she had 3 days of rectal bleeding back in April followed by 2 weeks of black stools, all of which have now resolved.  She does continue to feel fatigued.  Has not had care of her cirrhosis.  She is on hemodialysis for ESRD on M, W, F.   Case and plan discussed with Dr. San in office today.   - Will tentatively plan for EGD/Colon with 2-day prep in hospital setting. I thoroughly discussed the procedure with the patient to include nature of the procedure, alternatives, benefits, and risks (including but not limited to bleeding, infection, perforation, anesthesia/cardiac/pulmonary complications). Patient verbalized understanding and gave verbal consent to proceed with procedure.  - Will order abdominal ultrasound for HCC screening - Check labs today: CBC, CMP, PT/INR, AFP, iron , ferritin - Recalculate MELD based on today's labs. Last calculated MELD 24 on 04/2023. - Start Nexium  40mg  daily BID - Refer to liver clinic - Will discuss further recommendations with Dr. San and Dr. Wilhelmenia.   On further chart review it appears patient was seen in consult by [facility] for pretransplant evaluation on 08/04/2022 and it was noted that patient had a liver biopsy 12/09/2021 with diagnosis of cirrhosis of uncertain etiology.  At that time she was deemed not a candidate for kidney transplantation due to lack of social support and not engaged in care for cirrhosis, with ongoing alcohol use.   Camie Furbish, PA-C [facility] 01/03/2024, 8:17 AM  Patient Care  Team: Sigrid Kays, [place], MD as PCP - General (Family Medicine) Alvan Ronal BRAVO, MD (Inactive)  as PCP - Cardiology (Cardiology) Cindie Ole DASEN, MD as PCP - Electrophysiology (Cardiology)

## 2024-01-07 ENCOUNTER — Encounter (HOSPITAL_COMMUNITY): Payer: Self-pay | Admitting: Gastroenterology

## 2024-01-07 ENCOUNTER — Ambulatory Visit: Payer: Self-pay | Admitting: Gastroenterology

## 2024-01-07 LAB — AFP TUMOR MARKER: AFP-Tumor Marker: 3.2 ng/mL

## 2024-01-07 NOTE — Progress Notes (Signed)
 Agree with the assessment and plan as outlined by Valiant Gaul, PA-C.  Mollee Neer, DO, [facility]

## 2024-01-08 ENCOUNTER — Encounter (HOSPITAL_COMMUNITY): Admitting: Physician Assistant

## 2024-01-08 ENCOUNTER — Encounter (HOSPITAL_COMMUNITY)
Admission: RE | Disposition: A | Discharge status: Home / Self Care | Payer: Self-pay | Source: Home / Self Care | Attending: Gastroenterology

## 2024-01-08 ENCOUNTER — Other Ambulatory Visit: Payer: Self-pay

## 2024-01-08 ENCOUNTER — Ambulatory Visit (HOSPITAL_COMMUNITY)
Admission: RE | Admit: 2024-01-08 | Discharge: 2024-01-08 | Disposition: A | Discharge status: Home / Self Care | Attending: Gastroenterology | Admitting: Gastroenterology

## 2024-01-08 ENCOUNTER — Encounter (HOSPITAL_COMMUNITY): Payer: Self-pay | Admitting: Gastroenterology

## 2024-01-08 DIAGNOSIS — I132 Hypertensive heart and chronic kidney disease with heart failure and with stage 5 chronic kidney disease, or end stage renal disease: Secondary | ICD-10-CM

## 2024-01-08 DIAGNOSIS — K921 Melena: Secondary | ICD-10-CM

## 2024-01-08 DIAGNOSIS — I083 Combined rheumatic disorders of mitral, aortic and tricuspid valves: Secondary | ICD-10-CM | POA: Insufficient documentation

## 2024-01-08 DIAGNOSIS — K644 Residual hemorrhoidal skin tags: Secondary | ICD-10-CM | POA: Diagnosis not present

## 2024-01-08 DIAGNOSIS — Z950 Presence of cardiac pacemaker: Secondary | ICD-10-CM | POA: Insufficient documentation

## 2024-01-08 DIAGNOSIS — I700 Atherosclerosis of aorta: Secondary | ICD-10-CM | POA: Insufficient documentation

## 2024-01-08 DIAGNOSIS — D638 Anemia in other chronic diseases classified elsewhere: Secondary | ICD-10-CM

## 2024-01-08 DIAGNOSIS — E1122 Type 2 diabetes mellitus with diabetic chronic kidney disease: Secondary | ICD-10-CM | POA: Insufficient documentation

## 2024-01-08 DIAGNOSIS — K641 Second degree hemorrhoids: Secondary | ICD-10-CM | POA: Insufficient documentation

## 2024-01-08 DIAGNOSIS — K7460 Unspecified cirrhosis of liver: Secondary | ICD-10-CM

## 2024-01-08 DIAGNOSIS — K2970 Gastritis, unspecified, without bleeding: Secondary | ICD-10-CM | POA: Insufficient documentation

## 2024-01-08 DIAGNOSIS — I120 Hypertensive chronic kidney disease with stage 5 chronic kidney disease or end stage renal disease: Secondary | ICD-10-CM | POA: Diagnosis not present

## 2024-01-08 DIAGNOSIS — K625 Hemorrhage of anus and rectum: Secondary | ICD-10-CM

## 2024-01-08 DIAGNOSIS — K7030 Alcoholic cirrhosis of liver without ascites: Secondary | ICD-10-CM | POA: Diagnosis not present

## 2024-01-08 DIAGNOSIS — N186 End stage renal disease: Secondary | ICD-10-CM | POA: Insufficient documentation

## 2024-01-08 DIAGNOSIS — Z862 Personal history of diseases of the blood and blood-forming organs and certain disorders involving the immune mechanism: Secondary | ICD-10-CM

## 2024-01-08 DIAGNOSIS — K5730 Diverticulosis of large intestine without perforation or abscess without bleeding: Secondary | ICD-10-CM | POA: Insufficient documentation

## 2024-01-08 DIAGNOSIS — I5033 Acute on chronic diastolic (congestive) heart failure: Secondary | ICD-10-CM | POA: Diagnosis not present

## 2024-01-08 DIAGNOSIS — I5032 Chronic diastolic (congestive) heart failure: Secondary | ICD-10-CM | POA: Diagnosis not present

## 2024-01-08 DIAGNOSIS — D509 Iron deficiency anemia, unspecified: Secondary | ICD-10-CM | POA: Insufficient documentation

## 2024-01-08 DIAGNOSIS — I480 Paroxysmal atrial fibrillation: Secondary | ICD-10-CM | POA: Insufficient documentation

## 2024-01-08 DIAGNOSIS — Z992 Dependence on renal dialysis: Secondary | ICD-10-CM | POA: Diagnosis not present

## 2024-01-08 DIAGNOSIS — D121 Benign neoplasm of appendix: Secondary | ICD-10-CM | POA: Insufficient documentation

## 2024-01-08 DIAGNOSIS — K259 Gastric ulcer, unspecified as acute or chronic, without hemorrhage or perforation: Secondary | ICD-10-CM | POA: Diagnosis not present

## 2024-01-08 DIAGNOSIS — Z8711 Personal history of peptic ulcer disease: Secondary | ICD-10-CM

## 2024-01-08 DIAGNOSIS — K635 Polyp of colon: Secondary | ICD-10-CM

## 2024-01-08 HISTORY — PX: COLONOSCOPY: SHX5424

## 2024-01-08 HISTORY — PX: ESOPHAGOGASTRODUODENOSCOPY: SHX5428

## 2024-01-08 LAB — POCT I-STAT, CHEM 8
BUN: 31 mg/dL — ABNORMAL HIGH (ref 8–23)
Calcium, Ion: 0.99 mmol/L — ABNORMAL LOW (ref 1.15–1.40)
Chloride: 101 mmol/L (ref 98–111)
Creatinine, Ser: 6.8 mg/dL — ABNORMAL HIGH (ref 0.44–1.00)
Glucose, Bld: 113 mg/dL — ABNORMAL HIGH (ref 70–99)
HCT: 32 % — ABNORMAL LOW (ref 36.0–46.0)
Hemoglobin: 10.9 g/dL — ABNORMAL LOW (ref 12.0–15.0)
Potassium: 4.7 mmol/L (ref 3.5–5.1)
Sodium: 139 mmol/L (ref 135–145)
TCO2: 27 mmol/L (ref 22–32)

## 2024-01-08 SURGERY — COLONOSCOPY
Anesthesia: Monitor Anesthesia Care

## 2024-01-08 MED ORDER — PHENYLEPHRINE 80 MCG/ML (10ML) SYRINGE FOR IV PUSH (FOR BLOOD PRESSURE SUPPORT)
PREFILLED_SYRINGE | INTRAVENOUS | Status: DC | PRN
  Administered 2024-01-08: 80 ug via INTRAVENOUS

## 2024-01-08 MED ORDER — ONDANSETRON HCL 4 MG/2ML IJ SOLN
INTRAMUSCULAR | Status: DC | PRN
  Administered 2024-01-08: 4 mg via INTRAVENOUS

## 2024-01-08 MED ORDER — LIDOCAINE 2% (20 MG/ML) 5 ML SYRINGE
INTRAMUSCULAR | Status: DC | PRN
  Administered 2024-01-08: 60 mg via INTRAVENOUS

## 2024-01-08 MED ORDER — PROPOFOL 500 MG/50ML IV EMUL
INTRAVENOUS | Status: DC | PRN
  Administered 2024-01-08: 20 mg via INTRAVENOUS
  Administered 2024-01-08: 30 mg via INTRAVENOUS
  Administered 2024-01-08: 100 ug/kg/min via INTRAVENOUS
  Administered 2024-01-08: 20 mg via INTRAVENOUS
  Administered 2024-01-08: 40 ug/kg/min via INTRAVENOUS

## 2024-01-08 MED ORDER — SODIUM CHLORIDE 0.9 % IV SOLN: INTRAVENOUS | Status: DC

## 2024-01-08 NOTE — Anesthesia Preprocedure Evaluation (Signed)
 Anesthesia Evaluation  Patient identified by MRN, date of birth, ID band Patient awake    Reviewed: Allergy & Precautions, NPO status , Patient's Chart, lab work & pertinent test results  History of Anesthesia Complications Negative for: history of anesthetic complications  Airway Mallampati: I   Neck ROM: Full    Dental no notable dental hx. (+) Dental Advisory Given   Pulmonary neg pulmonary ROS   Pulmonary exam normal        Cardiovascular hypertension, Pt. on medications and Pt. on home beta blockers +CHF  Normal cardiovascular exam+ dysrhythmias + pacemaker    Echo 12/02/21: EF 60-65%, no RWMA, g2dd, mild pulm HTN, no MR, trivial AR   Neuro/Psych negative neurological ROS     GI/Hepatic PUD,,,(+) Cirrhosis         Endo/Other  diabetesHypothyroidism    Renal/GU ESRFRenal disease  negative genitourinary   Musculoskeletal negative musculoskeletal ROS (+)    Abdominal   Peds  Hematology  (+) Blood dyscrasia, anemia   Anesthesia Other Findings   Reproductive/Obstetrics                              Anesthesia Physical Anesthesia Plan  ASA: 4  Anesthesia Plan: MAC   Post-op Pain Management: Minimal or no pain anticipated   Induction: Intravenous  PONV Risk Score and Plan: 2 and Treatment may vary due to age or medical condition and Propofol  infusion  Airway Management Planned: Nasal Cannula  Additional Equipment: None  Intra-op Plan:   Post-operative Plan:   Informed Consent: I have reviewed the patients History and Physical, chart, labs and discussed the procedure including the risks, benefits and alternatives for the proposed anesthesia with the patient or authorized representative who has indicated his/her understanding and acceptance.     Dental advisory given  Plan Discussed with: Anesthesiologist, CRNA and Surgeon  Anesthesia Plan Comments:           Anesthesia Quick Evaluation

## 2024-01-08 NOTE — Anesthesia Procedure Notes (Signed)
 Procedure Name: MAC Date/Time: 01/08/2024 9:29 AM  Performed by: Joshua Vernell BROCKS, CRNAPre-anesthesia Checklist: Patient identified, Emergency Drugs available, Suction available and Patient being monitored Patient Re-evaluated:Patient Re-evaluated prior to induction Oxygen Delivery Method: Simple face mask Preoxygenation: Pre-oxygenation with 100% oxygen Placement Confirmation: positive ETCO2 and breath sounds checked- equal and bilateral Dental Injury: Teeth and Oropharynx as per pre-operative assessment

## 2024-01-08 NOTE — Discharge Instructions (Signed)

## 2024-01-08 NOTE — Op Note (Signed)
 [facility] Patient Name: Stacey Chambers Procedure Date: 01/08/2024 MRN: [id number] Attending MD: Sandor Flatter , MD, [id number] Date of Birth: 11/09/1950 CSN: [id number] Age: 73 Admit Type: Outpatient Procedure:                Upper GI endoscopy Indications:              Iron  deficiency anemia, Cirrhosis rule out                            esophageal varices Providers:                Sandor Flatter, MD, Randall Lines, RN, Farris Southgate, Technician Referring MD:              Medicines:                Monitored Anesthesia Care Complications:            No immediate complications. Estimated Blood Loss:     Estimated blood loss was minimal. Procedure:                Pre-Anesthesia Assessment:                           - Prior to the procedure, a History and Physical                            was performed, and patient medications and                            allergies were reviewed. The patient's tolerance of                            previous anesthesia was also reviewed. The risks                            and benefits of the procedure and the sedation                            options and risks were discussed with the patient.                            All questions were answered, and informed consent                            was obtained. Prior Anticoagulants: The patient has                            taken no anticoagulant or antiplatelet agents. ASA                            Grade Assessment: IV - A patient with severe  systemic disease that is a constant threat to life.                            After reviewing the risks and benefits, the patient                            was deemed in satisfactory condition to undergo the                            procedure.                           After obtaining informed consent, the endoscope was                            passed under direct vision.  Throughout the                            procedure, the patient's blood pressure, pulse, and                            oxygen saturations were monitored continuously. The                            [id number]-[id number] ([id number]) Olympus endoscope was introduced                            through the mouth, and advanced to the third part                            of duodenum. The upper GI endoscopy was                            accomplished without difficulty. The patient                            tolerated the procedure well. Scope In: Scope Out: Findings:      The examined esophagus was normal.      Diffuse mild inflammation characterized by congestion (edema) and       erythema was found in the gastric fundus, in the gastric body, at the       incisura and in the gastric antrum. There was moderate inflammation with       edema, erythema, and erosions in teh pre-pyloric antrum. Biopsies were       taken with a cold forceps for histology. There was persistent oozing at       the gastric body biopsy sites. Area was successfully injected with 3 mL       PuraStat for hemostasis with cessation of bleeding. Estimated blood loss       was minimal.      The examined duodenum was normal. Biopsies were taken with a cold       forceps for histology. Estimated blood loss was minimal. Impression:               - Normal esophagus.                           -  Gastritis. Biopsied.                           - There was persistent oozing at the gastric body                            biopsy sites, presumably from a combination of                            elevated INR, thrombocytopenia, and overall                            platelet dysfunction from ESRD. Area was                            successfully injected with 3 mL PuraStat for                            hemostasis with cessation of bleeding                           - Normal examined duodenum. Biopsied. Moderate Sedation:      Not Applicable -  Patient had care per Anesthesia. Recommendation:           - Patient has a contact number available for                            emergencies. The signs and symptoms of potential                            delayed complications were discussed with the                            patient. Return to normal activities tomorrow.                            Written discharge instructions were provided to the                            patient.                           - Resume previous diet.                           - Continue present medications.                           - Await pathology results.                           - Perform a colonoscopy today. Procedure Code(s):        --- Professional ---                           43255, 59, Esophagogastroduodenoscopy, flexible,  transoral; with control of bleeding, any method                           43239, Esophagogastroduodenoscopy, flexible,                            transoral; with biopsy, single or multiple Diagnosis Code(s):        --- Professional ---                           K29.70, Gastritis, unspecified, without bleeding                           D50.9, Iron  deficiency anemia, unspecified                           K74.60, Unspecified cirrhosis of liver CPT copyright 2022 American [employer] Association. All rights reserved. The codes documented in this report are preliminary and upon coder review may  be revised to meet current compliance requirements. Sandor Flatter, MD 01/08/2024 10:35:35 AM Number of Addenda: 0

## 2024-01-08 NOTE — Op Note (Signed)
 [facility] Patient Name: Stacey Chambers Procedure Date: 01/08/2024 MRN: [id number] Attending MD: Sandor Flatter , MD, [id number] Date of Birth: 05/07/1951 CSN: [id number] Age: 73 Admit Type: Outpatient Procedure:                Colonoscopy Indications:              Hematochezia, Iron  deficiency anemia Providers:                Sandor Flatter, MD, Randall Lines, RN, Farris Southgate, Technician Referring MD:              Medicines:                Monitored Anesthesia Care Complications:            No immediate complications. Estimated Blood Loss:     Estimated blood loss: none. Procedure:                Pre-Anesthesia Assessment:                           - Prior to the procedure, a History and Physical                            was performed, and patient medications and                            allergies were reviewed. The patient's tolerance of                            previous anesthesia was also reviewed. The risks                            and benefits of the procedure and the sedation                            options and risks were discussed with the patient.                            All questions were answered, and informed consent                            was obtained. Prior Anticoagulants: The patient has                            taken no anticoagulant or antiplatelet agents. ASA                            Grade Assessment: IV - A patient with severe                            systemic disease that is a constant threat to life.  After reviewing the risks and benefits, the patient                            was deemed in satisfactory condition to undergo the                            procedure.                           After obtaining informed consent, the colonoscope                            was passed under direct vision. Throughout the                            procedure, the patient's blood  pressure, pulse, and                            oxygen saturations were monitored continuously. The                            CF-HQ190L ([id number]) Olympus colonoscope was                            introduced through the anus and advanced to the the                            cecum, identified by appendiceal orifice and                            ileocecal valve. The colonoscopy was performed                            without difficulty. The patient tolerated the                            procedure well. The quality of the bowel                            preparation was good. The ileocecal valve,                            appendiceal orifice, and rectum were photographed. Scope In: 9:59:52 AM Scope Out: 10:15:17 AM Scope Withdrawal Time: 0 hours 12 minutes 10 seconds  Total Procedure Duration: 0 hours 15 minutes 25 seconds  Findings:      Hemorrhoids were found on perianal exam.      A 3 mm polyp was found in the ascending colon. The polyp was sessile.       The polyp was removed with a hot snare. Resection and retrieval were       complete. Estimated blood loss: none.      A few small-mouthed diverticula were found in the sigmoid colon and       descending colon.      Non-bleeding internal hemorrhoids were found during retroflexion. The  hemorrhoids were small and Grade II (internal hemorrhoids that prolapse       but reduce spontaneously). Impression:               - Hemorrhoids found on perianal exam.                           - One 3 mm polyp in the ascending colon, removed                            with a hot snare. Resected and retrieved.                           - Diverticulosis in the sigmoid colon and in the                            descending colon.                           - Non-bleeding internal hemorrhoids. Moderate Sedation:      Not Applicable - Patient had care per Anesthesia. Recommendation:           - Patient has a contact number available for                             emergencies. The signs and symptoms of potential                            delayed complications were discussed with the                            patient. Return to normal activities tomorrow.                            Written discharge instructions were provided to the                            patient.                           - Resume previous diet.                           - Continue present medications.                           - Await pathology results.                           - No repeat colonoscopy due to current age (71                            years or older).                           - To follow-up with Camie Furbish PA-C or Dr.  Mansouraty in the GI [facility]. Procedure Code(s):        --- Professional ---                           [phone], Colonoscopy, flexible; with removal of                            tumor(s), polyp(s), or other lesion(s) by snare                            technique Diagnosis Code(s):        --- Professional ---                           D12.2, Benign neoplasm of ascending colon                           K64.1, Second degree hemorrhoids                           K92.1, Melena (includes Hematochezia)                           D50.9, Iron  deficiency anemia, unspecified                           K57.30, Diverticulosis of large intestine without                            perforation or abscess without bleeding CPT copyright 2022 American Medical Association. All rights reserved. The codes documented in this report are preliminary and upon coder review may  be revised to meet current compliance requirements. Sandor Flatter, MD 01/08/2024 10:40:01 AM Number of Addenda: 0

## 2024-01-08 NOTE — Interval H&P Note (Signed)
 History and Physical Interval Note:  Reviewed most recent labs from 01/03/2024 which show uptrending H/H 11.7/34.3, MCV/RDW 101/16.  PLT down, now 67 (from 120s).  Normal liver enzymes aside from ALP 134.  Elevated ferritin at 517 with remainder of the iron  panel essentially normal.  INR slightly out at 1.3, but overall stable from previous.  aFP normal.  No new abdominal or hepatic imaging for review.  Plan to proceed with EGD/colonoscopy today.  01/08/2024 8:46 AM  Stacey Chambers  has presented today for surgery, with the diagnosis of Alcoholic cirrhosis, Rectal bleeding, hx gastric ulcer, hx IDA, anemia.  The various methods of treatment have been discussed with the patient and family. After consideration of risks, benefits and other options for treatment, the patient has consented to  Procedure(s): COLONOSCOPY (N/A) EGD (ESOPHAGOGASTRODUODENOSCOPY) (N/A) as a surgical intervention.  The patient's history has been reviewed, patient examined, no change in status, stable for surgery.  I have reviewed the patient's chart and labs.  Questions were answered to the patient's satisfaction.     Sandor GAILS Karine Garn

## 2024-01-08 NOTE — Transfer of Care (Signed)
 Immediate Anesthesia Transfer of Care Note  Patient: Stacey Chambers  Procedure(s) Performed: Procedure(s): COLONOSCOPY (N/A) EGD (ESOPHAGOGASTRODUODENOSCOPY) (N/A)  Patient Location: PACU  Anesthesia Type:MAC  Level of Consciousness: Patient comfortable, cooperative   Airway & Oxygen Therapy: Patient spontaneously breathing, ventilating well, oxygen via simple oxygen mask.  Post-op Assessment: Report given to PACU RN, vital signs reviewed and stable.   Post vital signs: Reviewed and stable.  Complications: No apparent anesthesia complications  Last Vitals:  Vitals Value Taken Time  BP 146/52 01/08/24 10:21  Temp    Pulse 79 01/08/24 10:26  Resp 17 01/08/24 10:26  SpO2 98 % 01/08/24 10:26  Vitals shown include unfiled device data.  Last Pain:  Vitals:   01/08/24 0908  TempSrc: Temporal  PainSc: 0-No pain         Complications: No notable events documented.

## 2024-01-09 ENCOUNTER — Ambulatory Visit: Payer: Self-pay | Admitting: Gastroenterology

## 2024-01-09 ENCOUNTER — Encounter (HOSPITAL_COMMUNITY): Payer: Self-pay | Admitting: Gastroenterology

## 2024-01-09 LAB — SURGICAL PATHOLOGY

## 2024-01-09 NOTE — Progress Notes (Signed)
 Attending Physician's Attestation   I have reviewed the chart.   I agree with the Advanced Practitioner's note, impression, and recommendations with any updates as below. Appreciate Dr. San being available for earlier procedures.  Further workup/evaluation to be dictated based on results of EGD/colonoscopy.   Aloha Finner, MD [facility] Advanced Endoscopy Office # [phone]

## 2024-01-09 NOTE — Anesthesia Postprocedure Evaluation (Signed)
 Anesthesia Post Note  Patient: [name]  Procedure(s) Performed: COLONOSCOPY EGD (ESOPHAGOGASTRODUODENOSCOPY)     Patient location during evaluation: PACU Anesthesia Type: MAC Level of consciousness: awake and alert Pain management: pain level controlled Vital Signs Assessment: post-procedure vital signs reviewed and stable Respiratory status: spontaneous breathing, nonlabored ventilation and respiratory function stable Cardiovascular status: blood pressure returned to baseline and stable Postop Assessment: no apparent nausea or vomiting Anesthetic complications: no   No notable events documented.  Last Vitals:  Vitals:   01/08/24 1044 01/08/24 1051  BP: (!) 194/90 (!) 158/51  Pulse: 78 80  Resp: 16 (!) 22  Temp:    SpO2: 96% 97%    Last Pain:  Vitals:   01/08/24 1051  TempSrc:   PainSc: 0-No pain                 Butler Levander Pinal

## 2024-01-11 ENCOUNTER — Inpatient Hospital Stay (HOSPITAL_COMMUNITY)
Admission: EM | Admit: 2024-01-11 | Discharge: 2024-01-14 | DRG: 377 | Disposition: A | Discharge status: Home / Self Care | Attending: Internal Medicine | Admitting: Internal Medicine

## 2024-01-11 ENCOUNTER — Encounter (HOSPITAL_COMMUNITY): Payer: Self-pay | Admitting: *Deleted

## 2024-01-11 ENCOUNTER — Other Ambulatory Visit: Payer: Self-pay

## 2024-01-11 DIAGNOSIS — Z91013 Allergy to seafood: Secondary | ICD-10-CM

## 2024-01-11 DIAGNOSIS — K7460 Unspecified cirrhosis of liver: Secondary | ICD-10-CM | POA: Diagnosis present

## 2024-01-11 DIAGNOSIS — K760 Fatty (change of) liver, not elsewhere classified: Secondary | ICD-10-CM | POA: Diagnosis present

## 2024-01-11 DIAGNOSIS — E785 Hyperlipidemia, unspecified: Secondary | ICD-10-CM | POA: Diagnosis present

## 2024-01-11 DIAGNOSIS — Z992 Dependence on renal dialysis: Secondary | ICD-10-CM

## 2024-01-11 DIAGNOSIS — I5032 Chronic diastolic (congestive) heart failure: Secondary | ICD-10-CM | POA: Diagnosis present

## 2024-01-11 DIAGNOSIS — K766 Portal hypertension: Secondary | ICD-10-CM | POA: Diagnosis present

## 2024-01-11 DIAGNOSIS — I442 Atrioventricular block, complete: Secondary | ICD-10-CM | POA: Diagnosis present

## 2024-01-11 DIAGNOSIS — Z833 Family history of diabetes mellitus: Secondary | ICD-10-CM

## 2024-01-11 DIAGNOSIS — K921 Melena: Secondary | ICD-10-CM | POA: Diagnosis not present

## 2024-01-11 DIAGNOSIS — Z950 Presence of cardiac pacemaker: Secondary | ICD-10-CM | POA: Diagnosis present

## 2024-01-11 DIAGNOSIS — G8929 Other chronic pain: Secondary | ICD-10-CM | POA: Diagnosis present

## 2024-01-11 DIAGNOSIS — E66811 Obesity, class 1: Secondary | ICD-10-CM | POA: Diagnosis present

## 2024-01-11 DIAGNOSIS — Z883 Allergy status to other anti-infective agents status: Secondary | ICD-10-CM

## 2024-01-11 DIAGNOSIS — Z6832 Body mass index (BMI) 32.0-32.9, adult: Secondary | ICD-10-CM

## 2024-01-11 DIAGNOSIS — K254 Chronic or unspecified gastric ulcer with hemorrhage: Principal | ICD-10-CM | POA: Diagnosis present

## 2024-01-11 DIAGNOSIS — K922 Gastrointestinal hemorrhage, unspecified: Principal | ICD-10-CM | POA: Diagnosis present

## 2024-01-11 DIAGNOSIS — E1122 Type 2 diabetes mellitus with diabetic chronic kidney disease: Secondary | ICD-10-CM | POA: Diagnosis present

## 2024-01-11 DIAGNOSIS — Z603 Acculturation difficulty: Secondary | ICD-10-CM | POA: Diagnosis present

## 2024-01-11 DIAGNOSIS — E119 Type 2 diabetes mellitus without complications: Secondary | ICD-10-CM

## 2024-01-11 DIAGNOSIS — I132 Hypertensive heart and chronic kidney disease with heart failure and with stage 5 chronic kidney disease, or end stage renal disease: Secondary | ICD-10-CM | POA: Diagnosis present

## 2024-01-11 DIAGNOSIS — N186 End stage renal disease: Secondary | ICD-10-CM | POA: Diagnosis present

## 2024-01-11 DIAGNOSIS — E875 Hyperkalemia: Secondary | ICD-10-CM | POA: Diagnosis present

## 2024-01-11 DIAGNOSIS — Z8711 Personal history of peptic ulcer disease: Secondary | ICD-10-CM

## 2024-01-11 DIAGNOSIS — Z79899 Other long term (current) drug therapy: Secondary | ICD-10-CM

## 2024-01-11 DIAGNOSIS — I480 Paroxysmal atrial fibrillation: Secondary | ICD-10-CM | POA: Diagnosis present

## 2024-01-11 DIAGNOSIS — D649 Anemia, unspecified: Secondary | ICD-10-CM

## 2024-01-11 DIAGNOSIS — K3189 Other diseases of stomach and duodenum: Secondary | ICD-10-CM | POA: Diagnosis present

## 2024-01-11 DIAGNOSIS — D631 Anemia in chronic kidney disease: Secondary | ICD-10-CM | POA: Diagnosis present

## 2024-01-11 DIAGNOSIS — D62 Acute posthemorrhagic anemia: Secondary | ICD-10-CM | POA: Diagnosis present

## 2024-01-11 LAB — COMPREHENSIVE METABOLIC PANEL WITH GFR
ALT: 19 U/L (ref 0–44)
AST: 32 U/L (ref 15–41)
Albumin: 3.2 g/dL — ABNORMAL LOW (ref 3.5–5.0)
Alkaline Phosphatase: 84 U/L (ref 38–126)
Anion gap: 19 — ABNORMAL HIGH (ref 5–15)
BUN: 74 mg/dL — ABNORMAL HIGH (ref 8–23)
CO2: 22 mmol/L (ref 22–32)
Calcium: 9.1 mg/dL (ref 8.9–10.3)
Chloride: 92 mmol/L — ABNORMAL LOW (ref 98–111)
Creatinine, Ser: 4.76 mg/dL — ABNORMAL HIGH (ref 0.44–1.00)
GFR, Estimated: 9 mL/min — ABNORMAL LOW (ref >60)
Glucose, Bld: 358 mg/dL — ABNORMAL HIGH (ref 70–99)
Potassium: 5.2 mmol/L — ABNORMAL HIGH (ref 3.5–5.1)
Sodium: 133 mmol/L — ABNORMAL LOW (ref 135–145)
Total Bilirubin: 0.9 mg/dL (ref 0.0–1.2)
Total Protein: 7.4 g/dL (ref 6.5–8.1)

## 2024-01-11 LAB — CBC
HCT: 21 % — ABNORMAL LOW (ref 36.0–46.0)
Hemoglobin: 6.9 g/dL — CL (ref 12.0–15.0)
MCH: 34.8 pg — ABNORMAL HIGH (ref 26.0–34.0)
MCHC: 32.9 g/dL (ref 30.0–36.0)
MCV: 106.1 fL — ABNORMAL HIGH (ref 80.0–100.0)
Platelets: 140 K/uL — ABNORMAL LOW (ref 150–400)
RBC: 1.98 MIL/uL — ABNORMAL LOW (ref 3.87–5.11)
RDW: 15.8 % — ABNORMAL HIGH (ref 11.5–15.5)
WBC: 8 K/uL (ref 4.0–10.5)
nRBC: 0.5 % — ABNORMAL HIGH (ref 0.0–0.2)

## 2024-01-11 LAB — PROTIME-INR
INR: 1.5 — ABNORMAL HIGH (ref 0.8–1.2)
Prothrombin Time: 19 s — ABNORMAL HIGH (ref 11.4–15.2)

## 2024-01-11 LAB — PREPARE RBC (CROSSMATCH)

## 2024-01-11 LAB — LIPASE, BLOOD: Lipase: 43 U/L (ref 11–51)

## 2024-01-11 MED ORDER — MORPHINE SULFATE (PF) 4 MG/ML IV SOLN
4.0000 mg | Freq: Once | INTRAVENOUS | Status: AC
  Administered 2024-01-11: 4 mg via INTRAVENOUS
  Filled 2024-01-11: qty 1

## 2024-01-11 MED ORDER — SODIUM CHLORIDE 0.9% IV SOLUTION: Freq: Once | INTRAVENOUS | Status: AC

## 2024-01-11 MED ORDER — OXYCODONE-ACETAMINOPHEN 5-325 MG PO TABS
1.0000 | ORAL_TABLET | Freq: Once | ORAL | Status: AC
  Administered 2024-01-11: 1 via ORAL
  Filled 2024-01-11: qty 1

## 2024-01-11 MED ORDER — ONDANSETRON HCL 4 MG/2ML IJ SOLN
4.0000 mg | Freq: Once | INTRAMUSCULAR | Status: AC
  Administered 2024-01-11: 4 mg via INTRAVENOUS
  Filled 2024-01-11: qty 2

## 2024-01-11 MED ORDER — PANTOPRAZOLE SODIUM 40 MG IV SOLR
40.0000 mg | Freq: Once | INTRAVENOUS | Status: AC
  Administered 2024-01-11: 40 mg via INTRAVENOUS
  Filled 2024-01-11: qty 10

## 2024-01-11 NOTE — ED Provider Notes (Signed)
 [facility] Emergency Department Provider Note MRN:  [id number]  Arrival date & time: 01/11/24     Chief Complaint   Leg Pain and Abdominal Pain   History of Present Illness   Stacey Chambers is a 73 y.o. year-old female presents to the ED with chief complaint of black stools.  She states that she had a recent upper and lower endoscopy.  She states that since leaving, she has noticed darkening of her stool, that is now black.  She is on dialysis and was told by her dialysis team that her hemoglobin was low.  She states that she has been very cold. She states that she wasn't able to finish her dialysis today because she didn't feel well.  She thinks that she has some excess fluid on her belly.   She also complains of chronic knee pain, but denies any new injury or trauma.  History provided by patient.   Review of Systems  Pertinent positive and negative review of systems noted in HPI.    Physical Exam   Vitals:   01/11/24 2025 01/11/24 2315  BP: (!) 135/46 (!) 128/56  Pulse: (!) 104 89  Resp: 16 19  Temp: 97.6 F (36.4 C)   SpO2: 98% 100%    CONSTITUTIONAL:  non toxic-appearing, NAD NEURO:  Alert and oriented x 3, CN 3-12 grossly intact EYES:  eyes equal and reactive ENT/NECK:  Supple, no stridor  CARDIO:  tachycardic, regular rhythm, appears well-perfused  PULM:  No respiratory distress, CTAB GI/GU:  non-distended, generalized discomfort without focal tenderness MSK/SPINE:  No gross deformities, no edema, moves all extremities  SKIN:  no rash, atraumatic   *Additional and/or pertinent findings included in MDM below  Diagnostic and Interventional Summary    EKG Interpretation Date/Time:  Friday January 11 2024 21:12:26 EDT Ventricular Rate:  102 PR Interval:  146 QRS Duration:  118 QT Interval:  390 QTC Calculation: 508 R Axis:   72  Text Interpretation: Sinus tachycardia Incomplete right bundle branch block T wave abnormality  inferiorly, compared to prior. Confirmed by Garrick Charleston [phone]) on 01/11/2024 9:19:23 PM       Labs Reviewed  COMPREHENSIVE METABOLIC PANEL WITH GFR - Abnormal; Notable for the following components:      Result Value   Sodium 133 (*)    Potassium 5.2 (*)    Chloride 92 (*)    Glucose, Bld 358 (*)    BUN 74 (*)    Creatinine, Ser 4.76 (*)    Albumin  3.2 (*)    GFR, Estimated 9 (*)    Anion gap 19 (*)    All other components within normal limits  CBC - Abnormal; Notable for the following components:   RBC 1.98 (*)    Hemoglobin 6.9 (*)    HCT 21.0 (*)    MCV 106.1 (*)    MCH 34.8 (*)    RDW 15.8 (*)    Platelets 140 (*)    nRBC 0.5 (*)    All other components within normal limits  PROTIME-INR - Abnormal; Notable for the following components:   Prothrombin Time 19.0 (*)    INR 1.5 (*)    All other components within normal limits  LIPASE, BLOOD  PREPARE RBC (CROSSMATCH)  TYPE AND SCREEN    No orders to display    Medications  0.9 %  sodium chloride  infusion (Manually program via Guardrails IV Fluids) (has no administration in time range)  morphine  (PF) 4 MG/ML  injection 4 mg (has no administration in time range)  ondansetron  (ZOFRAN ) injection 4 mg (has no administration in time range)  oxyCODONE -acetaminophen  (PERCOCET/ROXICET) 5-325 MG per tablet 1 tablet (1 tablet Oral Given 01/11/24 2100)  pantoprazole  (PROTONIX ) injection 40 mg (40 mg Intravenous Given 01/11/24 2240)     Procedures  /  Critical Care .Critical Care  Performed by: Vicky Charleston, PA-C Authorized by: Vicky Charleston, PA-C   Critical care provider statement:    Critical care time (minutes):  45   Critical care was necessary to treat or prevent imminent or life-threatening deterioration of the following conditions:  Circulatory failure   Critical care was time spent personally by me on the following activities:  Development of treatment plan with patient or surrogate, discussions with  consultants, evaluation of patient's response to treatment, examination of patient, ordering and review of laboratory studies, ordering and review of radiographic studies, ordering and performing treatments and interventions, pulse oximetry, re-evaluation of patient's condition and review of old charts   ED Course and Medical Decision Making  I have reviewed the triage vital signs, the nursing notes, and pertinent available records from the EMR.  Social Determinants Affecting Complexity of Care: Patient has no clinically significant social determinants affecting this chief complaint..   ED Course:    Medical Decision Making Patient here complaining of dark stools.  She had recent upper and lower endoscopies last week.  States that she has had dark stool for the past day or so.  She reports feeling fatigued and cold.  Hemoglobin today is 6.9, this is down significantly from a week ago when it was 11.7.  Endoscopy note from 7/8 states that there was some intraoperative persistent oozing at a gastric biopsy site, which was able to be managed and hemostasis achieved, but question if this has continued to ooze for the past few days.  Will start Protonix .  Will transfuse 1 unit.  Will admit to medicine for monitoring.  Secure chat sent to GI, Dr. Wilhelmenia.  Additionally, patient complains of acute on chronic knee pain.  She denies any new injuries.  Amount and/or Complexity of Data Reviewed Labs: ordered.  Risk Prescription drug management. Decision regarding hospitalization.         Consultants: I consulted with Hospitalist, Dr. Franky, who is appreciated for admitting.   Treatment and Plan: Patient's exam and diagnostic results are concerning for GI bleed and anemia.  Feel that patient will need admission to the hospital for further treatment and evaluation.  Patient discussed with attending physician, Dr. Garrick, who agrees with plan.  Final Clinical Impressions(s) / ED  Diagnoses     ICD-10-CM   1. Gastrointestinal hemorrhage, unspecified gastrointestinal hemorrhage type  K92.2     2. Anemia, unspecified type  D64.9       ED Discharge Orders     None         Discharge Instructions Discussed with and Provided to Patient:   Discharge Instructions   None      Vicky Charleston, PA-C 01/11/24 2349    Garrick Charleston, MD 01/12/24 [phone]

## 2024-01-11 NOTE — ED Triage Notes (Signed)
 The acuity was increased after the doctor saw the EKG and asked it to be increased

## 2024-01-11 NOTE — ED Notes (Signed)
 Called CCMD at [phone] to initiate cardiac monitoring.

## 2024-01-11 NOTE — ED Triage Notes (Signed)
 The pt is a dialysis pt and she is here crying with pain in her rt knee  she was dialyzed earlier today  she is due for a  endoscopy soon she just wants something for the pain

## 2024-01-11 NOTE — ED Triage Notes (Signed)
 Pt states her leg pains are getting worse which makes walking hard.

## 2024-01-11 NOTE — ED Notes (Signed)
 The pt is c/o bloody stools also

## 2024-01-12 ENCOUNTER — Observation Stay (HOSPITAL_COMMUNITY)

## 2024-01-12 ENCOUNTER — Encounter (HOSPITAL_COMMUNITY): Payer: Self-pay | Admitting: Internal Medicine

## 2024-01-12 DIAGNOSIS — N186 End stage renal disease: Secondary | ICD-10-CM | POA: Diagnosis present

## 2024-01-12 DIAGNOSIS — E1122 Type 2 diabetes mellitus with diabetic chronic kidney disease: Secondary | ICD-10-CM | POA: Diagnosis present

## 2024-01-12 DIAGNOSIS — K760 Fatty (change of) liver, not elsewhere classified: Secondary | ICD-10-CM | POA: Diagnosis present

## 2024-01-12 DIAGNOSIS — D631 Anemia in chronic kidney disease: Secondary | ICD-10-CM | POA: Diagnosis present

## 2024-01-12 DIAGNOSIS — D62 Acute posthemorrhagic anemia: Secondary | ICD-10-CM | POA: Diagnosis present

## 2024-01-12 DIAGNOSIS — I132 Hypertensive heart and chronic kidney disease with heart failure and with stage 5 chronic kidney disease, or end stage renal disease: Secondary | ICD-10-CM | POA: Diagnosis present

## 2024-01-12 DIAGNOSIS — K921 Melena: Secondary | ICD-10-CM | POA: Diagnosis present

## 2024-01-12 DIAGNOSIS — K254 Chronic or unspecified gastric ulcer with hemorrhage: Secondary | ICD-10-CM | POA: Diagnosis present

## 2024-01-12 DIAGNOSIS — K922 Gastrointestinal hemorrhage, unspecified: Secondary | ICD-10-CM | POA: Diagnosis not present

## 2024-01-12 DIAGNOSIS — Z79899 Other long term (current) drug therapy: Secondary | ICD-10-CM | POA: Diagnosis not present

## 2024-01-12 DIAGNOSIS — Z6832 Body mass index (BMI) 32.0-32.9, adult: Secondary | ICD-10-CM | POA: Diagnosis not present

## 2024-01-12 DIAGNOSIS — E875 Hyperkalemia: Secondary | ICD-10-CM | POA: Diagnosis present

## 2024-01-12 DIAGNOSIS — Z992 Dependence on renal dialysis: Secondary | ICD-10-CM | POA: Diagnosis not present

## 2024-01-12 DIAGNOSIS — Z883 Allergy status to other anti-infective agents status: Secondary | ICD-10-CM | POA: Diagnosis not present

## 2024-01-12 DIAGNOSIS — K7030 Alcoholic cirrhosis of liver without ascites: Secondary | ICD-10-CM | POA: Diagnosis not present

## 2024-01-12 DIAGNOSIS — I5032 Chronic diastolic (congestive) heart failure: Secondary | ICD-10-CM | POA: Diagnosis present

## 2024-01-12 DIAGNOSIS — K259 Gastric ulcer, unspecified as acute or chronic, without hemorrhage or perforation: Secondary | ICD-10-CM | POA: Diagnosis not present

## 2024-01-12 DIAGNOSIS — Z860100 Personal history of colon polyps, unspecified: Secondary | ICD-10-CM

## 2024-01-12 DIAGNOSIS — Z91013 Allergy to seafood: Secondary | ICD-10-CM | POA: Diagnosis not present

## 2024-01-12 DIAGNOSIS — E785 Hyperlipidemia, unspecified: Secondary | ICD-10-CM | POA: Diagnosis present

## 2024-01-12 DIAGNOSIS — Z603 Acculturation difficulty: Secondary | ICD-10-CM | POA: Diagnosis present

## 2024-01-12 DIAGNOSIS — K7460 Unspecified cirrhosis of liver: Secondary | ICD-10-CM

## 2024-01-12 DIAGNOSIS — K3189 Other diseases of stomach and duodenum: Secondary | ICD-10-CM | POA: Diagnosis not present

## 2024-01-12 DIAGNOSIS — Z833 Family history of diabetes mellitus: Secondary | ICD-10-CM | POA: Diagnosis not present

## 2024-01-12 DIAGNOSIS — G8929 Other chronic pain: Secondary | ICD-10-CM | POA: Diagnosis present

## 2024-01-12 DIAGNOSIS — I442 Atrioventricular block, complete: Secondary | ICD-10-CM | POA: Diagnosis present

## 2024-01-12 DIAGNOSIS — K766 Portal hypertension: Secondary | ICD-10-CM | POA: Diagnosis present

## 2024-01-12 DIAGNOSIS — E66811 Obesity, class 1: Secondary | ICD-10-CM | POA: Diagnosis present

## 2024-01-12 DIAGNOSIS — I480 Paroxysmal atrial fibrillation: Secondary | ICD-10-CM | POA: Diagnosis present

## 2024-01-12 DIAGNOSIS — Z950 Presence of cardiac pacemaker: Secondary | ICD-10-CM | POA: Diagnosis not present

## 2024-01-12 LAB — COMPREHENSIVE METABOLIC PANEL WITH GFR
ALT: 17 U/L (ref 0–44)
AST: 26 U/L (ref 15–41)
Albumin: 2.9 g/dL — ABNORMAL LOW (ref 3.5–5.0)
Alkaline Phosphatase: 63 U/L (ref 38–126)
Anion gap: 17 — ABNORMAL HIGH (ref 5–15)
BUN: 95 mg/dL — ABNORMAL HIGH (ref 8–23)
CO2: 22 mmol/L (ref 22–32)
Calcium: 8.9 mg/dL (ref 8.9–10.3)
Chloride: 94 mmol/L — ABNORMAL LOW (ref 98–111)
Creatinine, Ser: 5.14 mg/dL — ABNORMAL HIGH (ref 0.44–1.00)
GFR, Estimated: 8 mL/min — ABNORMAL LOW (ref >60)
Glucose, Bld: 200 mg/dL — ABNORMAL HIGH (ref 70–99)
Potassium: 5.6 mmol/L — ABNORMAL HIGH (ref 3.5–5.1)
Sodium: 133 mmol/L — ABNORMAL LOW (ref 135–145)
Total Bilirubin: 0.7 mg/dL (ref 0.0–1.2)
Total Protein: 6.7 g/dL (ref 6.5–8.1)

## 2024-01-12 LAB — CBC
HCT: 17.6 % — ABNORMAL LOW (ref 36.0–46.0)
HCT: 18.2 % — ABNORMAL LOW (ref 36.0–46.0)
Hemoglobin: 5.7 g/dL — CL (ref 12.0–15.0)
Hemoglobin: 6.2 g/dL — CL (ref 12.0–15.0)
MCH: 34.8 pg — ABNORMAL HIGH (ref 26.0–34.0)
MCH: 33.5 pg (ref 26.0–34.0)
MCHC: 32.4 g/dL (ref 30.0–36.0)
MCHC: 34.1 g/dL (ref 30.0–36.0)
MCV: 107.3 fL — ABNORMAL HIGH (ref 80.0–100.0)
MCV: 98.4 fL (ref 80.0–100.0)
Platelets: 97 K/uL — ABNORMAL LOW (ref 150–400)
Platelets: 96 K/uL — ABNORMAL LOW (ref 150–400)
RBC: 1.64 MIL/uL — ABNORMAL LOW (ref 3.87–5.11)
RBC: 1.85 MIL/uL — ABNORMAL LOW (ref 3.87–5.11)
RDW: 15.6 % — ABNORMAL HIGH (ref 11.5–15.5)
RDW: 20.6 % — ABNORMAL HIGH (ref 11.5–15.5)
WBC: 8 K/uL (ref 4.0–10.5)
WBC: 11.7 K/uL — ABNORMAL HIGH (ref 4.0–10.5)
nRBC: 1 % — ABNORMAL HIGH (ref 0.0–0.2)
nRBC: 1 % — ABNORMAL HIGH (ref 0.0–0.2)

## 2024-01-12 LAB — GLUCOSE, CAPILLARY
Glucose-Capillary: 121 mg/dL — ABNORMAL HIGH (ref 70–99)
Glucose-Capillary: 135 mg/dL — ABNORMAL HIGH (ref 70–99)

## 2024-01-12 LAB — PREPARE RBC (CROSSMATCH)

## 2024-01-12 LAB — PROTIME-INR
INR: 1.4 — ABNORMAL HIGH (ref 0.8–1.2)
Prothrombin Time: 18.3 s — ABNORMAL HIGH (ref 11.4–15.2)

## 2024-01-12 LAB — HEPATITIS B SURFACE ANTIGEN: Hepatitis B Surface Ag: NONREACTIVE

## 2024-01-12 LAB — CBG MONITORING, ED
Glucose-Capillary: 190 mg/dL — ABNORMAL HIGH (ref 70–99)
Glucose-Capillary: 190 mg/dL — ABNORMAL HIGH (ref 70–99)
Glucose-Capillary: 154 mg/dL — ABNORMAL HIGH (ref 70–99)
Glucose-Capillary: 116 mg/dL — ABNORMAL HIGH (ref 70–99)

## 2024-01-12 MED ORDER — ACETAMINOPHEN 325 MG PO TABS
650.0000 mg | ORAL_TABLET | Freq: Four times a day (QID) | ORAL | Status: DC | PRN
  Administered 2024-01-13 – 2024-01-14 (×2): 650 mg via ORAL
  Filled 2024-01-12: qty 2

## 2024-01-12 MED ORDER — INSULIN ASPART 100 UNIT/ML IJ SOLN
0.0000 [IU] | INTRAMUSCULAR | Status: DC
  Administered 2024-01-12 (×2): 1 [IU] via SUBCUTANEOUS

## 2024-01-12 MED ORDER — SODIUM CHLORIDE 0.9 % IV SOLN: INTRAVENOUS | Status: DC

## 2024-01-12 MED ORDER — PANTOPRAZOLE SODIUM 40 MG IV SOLR
40.0000 mg | Freq: Two times a day (BID) | INTRAVENOUS | Status: DC
  Administered 2024-01-12 – 2024-01-14 (×5): 40 mg via INTRAVENOUS
  Filled 2024-01-12 (×5): qty 10

## 2024-01-12 MED ORDER — NEPRO/CARBSTEADY PO LIQD: 237.0000 mL | ORAL | Status: DC | PRN

## 2024-01-12 MED ORDER — SODIUM CHLORIDE 0.9% IV SOLUTION: Freq: Once | INTRAVENOUS | Status: AC

## 2024-01-12 MED ORDER — DIPHENHYDRAMINE HCL 50 MG/ML IJ SOLN
25.0000 mg | Freq: Once | INTRAMUSCULAR | Status: AC
  Administered 2024-01-12: 25 mg via INTRAVENOUS
  Filled 2024-01-12: qty 1

## 2024-01-12 MED ORDER — SODIUM CHLORIDE 0.9 % IV SOLN
2.0000 g | INTRAVENOUS | Status: DC
  Administered 2024-01-12: 2 g via INTRAVENOUS
  Filled 2024-01-12: qty 20

## 2024-01-12 MED ORDER — ALTEPLASE 2 MG IJ SOLR: 2.0000 mg | Freq: Once | INTRAMUSCULAR | Status: DC | PRN

## 2024-01-12 MED ORDER — LIDOCAINE-PRILOCAINE 2.5-2.5 % EX CREA: 1.0000 | TOPICAL_CREAM | CUTANEOUS | Status: DC | PRN

## 2024-01-12 MED ORDER — ACETAMINOPHEN 650 MG RE SUPP: 650.0000 mg | Freq: Four times a day (QID) | RECTAL | Status: DC | PRN

## 2024-01-12 MED ORDER — HEPARIN SODIUM (PORCINE) 1000 UNIT/ML DIALYSIS: 1000.0000 [IU] | INTRAMUSCULAR | Status: DC | PRN

## 2024-01-12 MED ORDER — ANTICOAGULANT SODIUM CITRATE 4% (200MG/5ML) IV SOLN: 5.0000 mL | Status: DC | PRN

## 2024-01-12 MED ORDER — PENTAFLUOROPROP-TETRAFLUOROETH EX AERO: 1.0000 | INHALATION_SPRAY | CUTANEOUS | Status: DC | PRN

## 2024-01-12 MED ORDER — LIDOCAINE HCL (PF) 1 % IJ SOLN: 5.0000 mL | INTRAMUSCULAR | Status: DC | PRN

## 2024-01-12 MED ORDER — DIPHENHYDRAMINE HCL 50 MG/ML IJ SOLN
12.5000 mg | Freq: Once | INTRAMUSCULAR | Status: AC
  Administered 2024-01-12: 12.5 mg via INTRAVENOUS
  Filled 2024-01-12: qty 1

## 2024-01-12 NOTE — Consult Note (Addendum)
 Consultation Note   Referring Provider:  [facility] PCP: Sigrid Kays, Sula, MD Primary Gastroenterologist:   Aloha Finner, MD       Reason for Consultation:  GI bleed with melena DOA: 01/11/2024         Hospital Day: 2   ASSESSMENT    **Telephone [name] interpreter [facility] interpreters) used for this consultation  73 yo female with cirrhosis  (felt to be 2/2 to [facility] +/- NASH) with prior decompensation in June 2023  No portal hypertensive changes / varices on EGD a few days ago.  Of note prior workup for etiology of cirrhosis has been done. She did have a low ceruloplasmin  / elevated 24 hour urinary copper  but not felt to have Wilson's disease. Additionally she had + ASMA but not felt to have autoimmune hepatitis.  MELD 3.0: 26 at 01/12/2024  3:31 AM INR 1.4.  Mentation normal.  Other than albumin  of 2.9, her LFTs are normal  Hemodynamically stable GI bleed with black stool since EGD on 7/8 and  acute on chronic anemia.  Likely secondary to bleeding from recent gastric biopsy site ( 7/8).  A small polyp was hot snared from the ascending colon on the same day but this seems unlikely source of bleeding Presenting hemoglobin 6.9, down from 10.9 a few days ago.  Further decline in hemoglobin overnight to 5.7.  Not actively bleeding.  Last bowel movement was yesterday  ESRD on HD MWF Patient tells me her dialysis session yesterday was cut short because she did not feel well. Potassium is elevated at 5.6 today.  BUN 95,  creatinine 5.14  See PMH for additional history  Principal Problem:   GI bleeding Active Problems:   History of gastric ulcer   Cirrhosis (HCC)   DM2 (diabetes mellitus, type 2) (HCC)   End stage renal disease on dialysis (HCC)   CHB (complete heart block) (HCC)   S/P placement of leadless cardiac pacemaker   ABLA (acute blood loss anemia)      PLAN:   -- Transfused 1 unit PRBCs this a.m. , await  post transfusion H/H -- Continue BID IV PPI -- Adding empiric Rocephin  -- Will give clear liquids --Will  need repeat EGD once hemoglobin and electrolytes have improved Sounds like her dialysis session was cut short yesterday so may hold on EGD until after she is dialyzed .  Using [name] interpreter the risks and benefits of EGD were discussed with the patient who agrees to proceed.  -- Does not appear to have ascites based on exam. Low suspicion for SBP  but given endorsement of generalized lower abdominal discomfort will get an abdominal ultrasound to check for ascites  HPI   73 y.o. year old [name] speaking  female with a medical history including but not limited to DM, HTN, HLD, AFib, HFpEF, pericardial effusion with cardiac tamponade Oct 2024, complete heart block s/p PPM, cirrhosis, Etoh abuse, ESRD on HD,  PUD ( GU), diverticulosis,  hemorrhoids, IDA.   BRIEF GI HISTORY Followed in 2022 by us  for history of PUD.  June 2023 Admission Hospitalized with lower extremity edema. She was diagnosed with decompensated cirrhosis. Dx paracentesis was negative for SBP, SAAG consistent with portal HTN. Cytology negative  for malignant cells. Serologic evaluation : Negative HBsAg, HCV ab, ANA, AMA and A-1 antitrypsin. No evidence for hemochromatosis. Ceruloplasmin low at 16.4. ASMA elevated at 52. Plan was for eventual 24 hour urine copper     She was seen in our office 01/03/24 for GI bleeding. Plan was for labs, [facility] screening and EGD / colonoscopy with 2 bowel bowel prep and also referral to liver clinic. EGD on 7/8 showed oozing from a previous gastric biopsy site treated with purastat.  A 3 mm polyp was removed via hot snare  INTERVAL HISTORY Presented to ED yesterday with leg pain and black stool. Hgb 6.9, down from 10.9 3 days ago. Further decline today 5.7. INR 1.4, K+ 5.6, Albumin  2.9 remainder of LFT normal.  Aysel had a EGD and colonoscopy on Wednesday.  Gastric biopsy was done, small ascending  colon polyp was hot snared.  She also endorses some vague generalized lower abdominal discomfort since Wednesday.  No nausea nor vomiting.  She denies NSAID use.  She is on Velphoro which is an iron  based phosphate binder but this was not just recently started.  She is on BID PPI for history of gastric ulcers.  Letti apparently had to cut her dialysis session short yesterday due to not feeling well.  She has received a blood infusion today and does feel better   Labs and Imaging:  Recent Labs    01/11/24 2114 01/12/24 0331  PROT 7.4 6.7  ALBUMIN  3.2* 2.9*  AST 32 26  ALT 19 17  ALKPHOS 84 63  BILITOT 0.9 0.7   Recent Labs    01/11/24 2114 01/12/24 0331  WBC 8.0 8.0  HGB 6.9* 5.7*  HCT 21.0* 17.6*  MCV 106.1* 107.3*  PLT 140* 97*   Recent Labs    01/11/24 2114 01/12/24 0331  NA 133* 133*  K 5.2* 5.6*  CL 92* 94*  CO2 22 22  GLUCOSE 358* 200*  BUN 74* 95*  CREATININE 4.76* 5.14*  CALCIUM  9.1 8.9     CT HEAD WO CONTRAST ( ) CLINICAL DATA:  Chronic headaches  EXAM: CT HEAD WITHOUT CONTRAST  TECHNIQUE: Contiguous axial images were obtained from the base of the skull through the vertex without intravenous contrast.  RADIATION DOSE REDUCTION: This exam was performed according to the departmental dose-optimization program which includes automated exposure control, adjustment of the mA and/or kV according to patient size and/or use of iterative reconstruction technique.  COMPARISON:  11/30/2022  FINDINGS: Brain: No mass,hemorrhage or extra-axial collection. Normal appearance of the parenchyma and CSF spaces.  Vascular: No hyperdense vessel or unexpected vascular calcification.  Skull: The visualized skull base, calvarium and extracranial soft tissues are normal.  Sinuses/Orbits: No fluid levels or advanced mucosal thickening of the visualized paranasal sinuses. No mastoid or middle ear effusion. Normal orbits.  Other: None.  IMPRESSION: Normal head  CT.  Electronically Signed   By: Franky Stanford M.D.   On: 12/22/2023 00:55    Pertinent GI Studies   EGD 01/08/24 and Colonoscopy for GI bleed EGD - Normal esophagus. - Gastritis. Biopsied. - There was persistent oozing at the gastric body biopsy sites, presumably from a combination of elevated INR, thrombocytopenia, and overall platelet dysfunction from ESRD. Area was successfully injected with 3 mL PuraStat for hemostasis with cessation of bleeding - Normal examined duodenum. Biopsied Colonoscopy --Prep good - Hemorrhoids found on perianal exam. - One 3 mm polyp in the ascending colon, removed with a hot snare. Resected and retrieved.  Diverticulosis in  the sigmoid colon and in the descending colon.  Non-bleeding internal hemorrhoids   A. STOMACH, BIOPSY:       Gastric antral/oxyntic mucosa with no significant diagnostic  alteration.  No H. pylori identified on HE stain.  Negative for intestinal metaplasia or dysplasia.   B. DUODENUM, BIOPSY:  Duodenal mucosa with preserved villoglandular architecture without  increased intraepithelial lymphocytes or evidence of active  inflammation.  No evidence of gluten sensitive enteropathy.   C. COLON, APPENDICEAL ORIFICE, POLYPECTOMY:       Tubular adenoma.       Negative for high-grade dysplasia.    Colonoscopy 09/13/2021 - Preparation of the colon was inadequate.  - Hemorrhoids found on digital rectal exam.  - Stool in the proximal transverse colon, at the hepatic flexure, in the ascending colon and in the cecum.  - Diverticulosis in the recto- sigmoid colon and in the sigmoid colon.  - Normal mucosa in the rectum, in the recto- sigmoid colon, in the sigmoid colon and in the descending colon.  - Non- bleeding non- thrombosed external and internal hemorrhoids. - Recall 6-12 months with 2-day prep  EGD 09/13/2021 - No gross lesions in esophagus. Z- line irregular, 39 cm from the incisors.  - 2 cm hiatal hernia.  - Non- bleeding  gastric ulcer with a clean ulcer base ( Forrest Class III) . Gastritis throughout. Gastric mapping performed.  - Erythematous duodenopathy in bulb. No gross lesions in the second portion of the duodenum.  EGD 03/17/2021 - No gross lesions in esophagus. Biopsied.  - Z- line irregular, 39 cm from the incisors.  - 1 cm hiatal hernia.  - Non- bleeding gastric ulcer with a clean ulcer base ( Forrest Class III) in antrum. Gastritis throughout - biopsied.  - Erythematous duodenopathy. Biopsied.   Past Medical History:  Diagnosis Date   Anemia    Cirrhosis (HCC)    Diabetes mellitus without complication (HCC)    ESRD (end stage renal disease) on dialysis (HCC)    Hypertension    Paroxysmal atrial fibrillation (HCC)    Type 2 diabetes mellitus (HCC)    Vision loss     Past Surgical History:  Procedure Laterality Date   AV FISTULA PLACEMENT Left 12/23/2021   Procedure: INSERTION OF LEFT ARM BRACHIOCEPHALIC ARTERIOVENOUS FISTULA;  Surgeon: Gretta Lonni PARAS, MD;  Location: [facility] OR;  Service: Vascular;  Laterality: Left;   BACK SURGERY     COLONOSCOPY     COLONOSCOPY N/A 01/08/2024   Procedure: COLONOSCOPY;  Surgeon: San Sandor GAILS, DO;  Location: [facility] ENDOSCOPY;  Service: Gastroenterology;  Laterality: N/A;   ESOPHAGOGASTRODUODENOSCOPY N/A 01/08/2024   Procedure: EGD (ESOPHAGOGASTRODUODENOSCOPY);  Surgeon: San Sandor GAILS, DO;  Location: [facility] ENDOSCOPY;  Service: Gastroenterology;  Laterality: N/A;   IR FLUORO GUIDE CV LINE RIGHT  12/16/2021   IR US  GUIDE VASC ACCESS RIGHT  12/16/2021   PACEMAKER LEADLESS INSERTION N/A 01/15/2023   Procedure: PACEMAKER LEADLESS INSERTION;  Surgeon: Cindie Ole DASEN, MD;  Location: [facility] INVASIVE CV LAB;  Service: Cardiovascular;  Laterality: N/A;   PERICARDIOCENTESIS N/A 04/12/2023   Procedure: PERICARDIOCENTESIS;  Surgeon: Wendel Lurena POUR, MD;  Location[facility] INV[facility]IVE CV LAB;  Service: Cardiovascular;  Laterality: N/A;   TEMPORARY PACEMAKER N/A 01/13/2023    Procedure: TEMPORARY PACEMAKER;  Surgeon: Wendel Lurena POUR, MD;  Location: [facility] INVASIVE CV LAB;  Service: Cardiovascular;  Laterality: N/A;   UPPER GASTROINTESTINAL ENDOSCOPY      Family History  Problem Relation Age of Onset   Diabetes  Brother    Colon cancer Neg Hx    Esophageal cancer Neg Hx    Stomach cancer Neg Hx    Pancreatic cancer Neg Hx    Inflammatory bowel disease Neg Hx    Liver disease Neg Hx    Rectal cancer Neg Hx    Colon polyps Neg Hx     Prior to Admission medications   Medication Sig Start Date End Date Taking? Authorizing Provider  acetaminophen  (TYLENOL ) 500 MG tablet Take 500 mg by mouth every 8 (eight) hours as needed for moderate pain.   Yes [provider]  albuterol  (VENTOLIN  HFA) 108 (90 Base) MCG/ACT inhaler Inhale 1-2 puffs into the lungs every 6 (six) hours as needed for wheezing or shortness of breath. 12/06/22  Yes [provider]  amLODipine  (NORVASC ) 10 MG tablet Take 10 mg by mouth daily.   Yes [provider]  B Complex-C-Zn-Folic Acid  (DIALYVITE 800 WITH ZINC ) 0.8 MG TABS Take 1 tablet by mouth at bedtime. 12/07/23  Yes [provider]  benzonatate  (TESSALON ) 100 MG capsule Take 100 mg by mouth 3 (three) times daily as needed. 09/27/23  Yes [provider]  esomeprazole  (NEXIUM ) 40 MG capsule Take 1 capsule (40 mg total) by mouth 2 (two) times daily before a meal. 01/03/24 07/01/24 Yes Heinz, Camie BRAVO, PA-C  furosemide  (LASIX ) 80 MG tablet Take 80 mg by mouth daily. 01/01/24  Yes [provider]  Glycerin-Hypromellose-PEG 400 (VISINE DRY EYE OP) Apply 1 drop to eye 2 (two) times daily as needed (irritation).   Yes [provider]  ketorolac (ACULAR) 0.5 % ophthalmic solution Place 1 drop into the right eye 4 (four) times daily. 04/11/23  Yes [provider]  lidocaine -prilocaine  (EMLA ) cream Apply 1 Application topically. 01/01/24  Yes [provider]  LOKELMA  10 g PACK packet Take 10  g by mouth daily. 04/12/23  Yes [provider]  metoprolol  succinate (TOPROL -XL) 25 MG 24 hr tablet Take 1 tablet (25 mg total) by mouth daily as needed. 10/18/23  Yes Goodrich, Callie E, PA-C  mirtazapine  (REMERON ) 15 MG tablet Take 15 mg by mouth See admin instructions. Take 1 tablet by mouth nightly on non-dialysis days 10/10/22  Yes [provider]  sevelamer  carbonate (RENVELA ) 800 MG tablet Take 1 tablet (800 mg total) by mouth 3 (three) times daily with meals. Patient taking differently: Take 1,600 mg by mouth 3 (three) times daily with meals. 10/27/22  Yes Leotis Bogus, MD  traMADol  (ULTRAM ) 50 MG tablet Take 50 mg by mouth daily as needed for moderate pain (pain score 4-6). 10/29/23  Yes [provider]  VELPHORO 500 MG chewable tablet Chew 500 mg by mouth 5 (five) times daily. 12/26/23  Yes [provider]  losartan  (COZAAR ) 100 MG tablet Take 100 mg by mouth at bedtime. Patient not taking: Reported on 01/12/2024 09/07/23   [provider]    Current Facility-Administered Medications  Medication Dose Route Frequency Provider Last Rate Last Admin   acetaminophen  (TYLENOL ) tablet 650 mg  650 mg Oral Q6H PRN Franky Redia SAILOR, MD       Or   acetaminophen  (TYLENOL ) suppository 650 mg  650 mg Rectal Q6H PRN Franky Redia SAILOR, MD       insulin  aspart (novoLOG ) injection 0-6 Units  0-6 Units Subcutaneous Q4H Franky Redia SAILOR, MD   1 Units at 01/12/24 0819   pantoprazole  (PROTONIX ) injection 40 mg  40 mg Intravenous Q12H Franky Redia SAILOR, MD  Current Outpatient Medications  Medication Sig Dispense Refill   acetaminophen  (TYLENOL ) 500 MG tablet Take 500 mg by mouth every 8 (eight) hours as needed for moderate pain.     albuterol  (VENTOLIN  HFA) 108 (90 Base) MCG/ACT inhaler Inhale 1-2 puffs into the lungs every 6 (six) hours as needed for wheezing or shortness of breath.     amLODipine  (NORVASC ) 10 MG tablet Take 10 mg by mouth daily.      B Complex-C-Zn-Folic Acid  (DIALYVITE 800 WITH ZINC ) 0.8 MG TABS Take 1 tablet by mouth at bedtime.     benzonatate  (TESSALON ) 100 MG capsule Take 100 mg by mouth 3 (three) times daily as needed.     esomeprazole  (NEXIUM ) 40 MG capsule Take 1 capsule (40 mg total) by mouth 2 (two) times daily before a meal. 60 capsule 3   furosemide  (LASIX ) 80 MG tablet Take 80 mg by mouth daily.     Glycerin-Hypromellose-PEG 400 (VISINE DRY EYE OP) Apply 1 drop to eye 2 (two) times daily as needed (irritation).     ketorolac (ACULAR) 0.5 % ophthalmic solution Place 1 drop into the right eye 4 (four) times daily.     lidocaine -prilocaine  (EMLA ) cream Apply 1 Application topically.     LOKELMA  10 g PACK packet Take 10 g by mouth daily.     metoprolol  succinate (TOPROL -XL) 25 MG 24 hr tablet Take 1 tablet (25 mg total) by mouth daily as needed. 30 tablet 4   mirtazapine  (REMERON ) 15 MG tablet Take 15 mg by mouth See admin instructions. Take 1 tablet by mouth nightly on non-dialysis days     sevelamer  carbonate (RENVELA ) 800 MG tablet Take 1 tablet (800 mg total) by mouth 3 (three) times daily with meals. (Patient taking differently: Take 1,600 mg by mouth 3 (three) times daily with meals.) 90 tablet 1   traMADol  (ULTRAM ) 50 MG tablet Take 50 mg by mouth daily as needed for moderate pain (pain score 4-6).     VELPHORO 500 MG chewable tablet Chew 500 mg by mouth 5 (five) times daily.     losartan  (COZAAR ) 100 MG tablet Take 100 mg by mouth at bedtime. (Patient not taking: Reported on 01/12/2024)      Allergies as of 01/11/2024 - Review Complete 01/11/2024  Allergen Reaction Noted   Chlorhexidine  Rash 06/13/2023   Fish allergy Itching 04/13/2023    Social History   Socioeconomic History   Marital status: Married    Spouse name: Not on file   Number of children: Not on file   Years of education: Not on file   Highest education level: Not on file  Occupational History   Not on file  Tobacco Use   Smoking  status: Never   Smokeless tobacco: Never  Vaping Use   Vaping status: Never Used  Substance and Sexual Activity   Alcohol use: Never    Comment: once in awhile   Drug use: Not Currently    Types: Marijuana    Comment: Use of THC tea 1-2 times per week   Sexual activity: Not on file  Other Topics Concern   Not on file  Social History Narrative   Not on file   Social Drivers of Health   Financial Resource Strain: Not on file  Food Insecurity: No Food Insecurity (04/22/2023)   Hunger Vital Sign    Worried About Running Out of Food in the Last Year: Never true    Ran Out of Food in the Last Year: Never  true  Transportation Needs: No Transportation Needs (04/22/2023)   PRAPARE - Administrator, Civil Service (Medical): No    Lack of Transportation (Non-Medical): No  Physical Activity: Not on file  Stress: Not on file  Social Connections: Unknown (09/14/2022)   Received from [facility]   Social Network    Social Network: Not on file  Intimate Partner Violence: Not At Risk (04/22/2023)   Humiliation, Afraid, Rape, and Kick questionnaire    Fear of Current or Ex-Partner: No    Emotionally Abused: No    Physically Abused: No    Sexually Abused: No     Code Status   Code Status: Full Code  Review of Systems: All systems reviewed and negative except where noted in HPI.  Physical Exam: Vital signs in last 24 hours: Temp:  [97.5 F (36.4 C)-98.1 F (36.7 C)] 98.1 F (36.7 C) (07/12 0544) Pulse Rate:  [85-104] 91 (07/12 0544) Resp:  [16-22] 18 (07/12 0544) BP: (104-135)/(33-68) 122/61 (07/12 0544) SpO2:  [98 %-100 %] 100 % (07/12 0530) Weight:  [71 kg] 71 kg (07/11 2053)    General:  Pleasant female in NAD Psych:  Cooperative. Normal mood and affect Eyes: Pupils equal Ears:  Normal auditory acuity Nose: No deformity, discharge or lesions Neck:  Supple, no masses felt Lungs:  Clear to auscultation.  Heart:  Regular rate, regular rhythm.  Abdomen:   Soft, nondistended, nontender, active bowel sounds, no masses felt Rectal :  Deferred Msk: Symmetrical without gross deformities.  Neurologic:  Alert, oriented, grossly normal neurologically Extremities : No edema Skin:  Intact without significant lesions.    Intake/Output from previous day: No intake/output data recorded. Intake/Output this shift:  No intake/output data recorded.   Vina Dasen, NP-C   01/12/2024, 8:34 AM  Chronic stable NAFLD related cirrhosis, no history of ascites or varices (no ascites seen on ultrasound today), here for acute blood loss anemia on top of chronic cirrhosis related anemia with recent melena (last time yesterday).  Most likely from persistent oozing out of gastric biopsy site based on last Tuesday's EGD report by Dr. San.  Patient hemodynamically stable, is receiving PRBCs and needs dialysis later today (nephrology reportedly aware, according to my conversation with the ED nurse). Plan is for EGD tomorrow after she has dialysis hopefully later today (had interrupted HD session yesterday, BUN and potassium are elevated).  No ascites, so Rocephin  was discontinued  Patient at increased risk for cardiopulmonary complications of procedure due to medical comorbidities.  High degree medical decision making, complex patient

## 2024-01-12 NOTE — H&P (View-Only) (Signed)
 Consultation Note   Referring Provider:  [facility] PCP: Sigrid Kays, Sula, MD Primary Gastroenterologist:   Aloha Finner, MD       Reason for Consultation:  GI bleed with melena DOA: 01/11/2024         Hospital Day: 2   ASSESSMENT    **Telephone [name] interpreter [facility] interpreters) used for this consultation  73 yo female with cirrhosis  (felt to be 2/2 to [facility] +/- NASH) with prior decompensation in June 2023  No portal hypertensive changes / varices on EGD a few days ago.  Of note prior workup for etiology of cirrhosis has been done. She did have a low ceruloplasmin  / elevated 24 hour urinary copper  but not felt to have Wilson's disease. Additionally she had + ASMA but not felt to have autoimmune hepatitis.  MELD 3.0: 26 at 01/12/2024  3:31 AM INR 1.4.  Mentation normal.  Other than albumin  of 2.9, her LFTs are normal  Hemodynamically stable GI bleed with black stool since EGD on 7/8 and  acute on chronic anemia.  Likely secondary to bleeding from recent gastric biopsy site ( 7/8).  A small polyp was hot snared from the ascending colon on the same day but this seems unlikely source of bleeding Presenting hemoglobin 6.9, down from 10.9 a few days ago.  Further decline in hemoglobin overnight to 5.7.  Not actively bleeding.  Last bowel movement was yesterday  ESRD on HD MWF Patient tells me her dialysis session yesterday was cut short because she did not feel well. Potassium is elevated at 5.6 today.  BUN 95,  creatinine 5.14  See PMH for additional history  Principal Problem:   GI bleeding Active Problems:   History of gastric ulcer   Cirrhosis (HCC)   DM2 (diabetes mellitus, type 2) (HCC)   End stage renal disease on dialysis (HCC)   CHB (complete heart block) (HCC)   S/P placement of leadless cardiac pacemaker   ABLA (acute blood loss anemia)      PLAN:   -- Transfused 1 unit PRBCs this a.m. , await  post transfusion H/H -- Continue BID IV PPI -- Adding empiric Rocephin  -- Will give clear liquids --Will  need repeat EGD once hemoglobin and electrolytes have improved Sounds like her dialysis session was cut short yesterday so may hold on EGD until after she is dialyzed .  Using [name] interpreter the risks and benefits of EGD were discussed with the patient who agrees to proceed.  -- Does not appear to have ascites based on exam. Low suspicion for SBP  but given endorsement of generalized lower abdominal discomfort will get an abdominal ultrasound to check for ascites  HPI   73 y.o. year old [name] speaking  female with a medical history including but not limited to DM, HTN, HLD, AFib, HFpEF, pericardial effusion with cardiac tamponade Oct 2024, complete heart block s/p PPM, cirrhosis, Etoh abuse, ESRD on HD,  PUD ( GU), diverticulosis,  hemorrhoids, IDA.   BRIEF GI HISTORY Followed in 2022 by us  for history of PUD.  June 2023 Admission Hospitalized with lower extremity edema. She was diagnosed with decompensated cirrhosis. Dx paracentesis was negative for SBP, SAAG consistent with portal HTN. Cytology negative  for malignant cells. Serologic evaluation : Negative HBsAg, HCV ab, ANA, AMA and A-1 antitrypsin. No evidence for hemochromatosis. Ceruloplasmin low at 16.4. ASMA elevated at 52. Plan was for eventual 24 hour urine copper     She was seen in our office 01/03/24 for GI bleeding. Plan was for labs, [facility] screening and EGD / colonoscopy with 2 bowel bowel prep and also referral to liver clinic. EGD on 7/8 showed oozing from a previous gastric biopsy site treated with purastat.  A 3 mm polyp was removed via hot snare  INTERVAL HISTORY Presented to ED yesterday with leg pain and black stool. Hgb 6.9, down from 10.9 3 days ago. Further decline today 5.7. INR 1.4, K+ 5.6, Albumin  2.9 remainder of LFT normal.  Aysel had a EGD and colonoscopy on Wednesday.  Gastric biopsy was done, small ascending  colon polyp was hot snared.  She also endorses some vague generalized lower abdominal discomfort since Wednesday.  No nausea nor vomiting.  She denies NSAID use.  She is on Velphoro which is an iron  based phosphate binder but this was not just recently started.  She is on BID PPI for history of gastric ulcers.  Letti apparently had to cut her dialysis session short yesterday due to not feeling well.  She has received a blood infusion today and does feel better   Labs and Imaging:  Recent Labs    01/11/24 2114 01/12/24 0331  PROT 7.4 6.7  ALBUMIN  3.2* 2.9*  AST 32 26  ALT 19 17  ALKPHOS 84 63  BILITOT 0.9 0.7   Recent Labs    01/11/24 2114 01/12/24 0331  WBC 8.0 8.0  HGB 6.9* 5.7*  HCT 21.0* 17.6*  MCV 106.1* 107.3*  PLT 140* 97*   Recent Labs    01/11/24 2114 01/12/24 0331  NA 133* 133*  K 5.2* 5.6*  CL 92* 94*  CO2 22 22  GLUCOSE 358* 200*  BUN 74* 95*  CREATININE 4.76* 5.14*  CALCIUM  9.1 8.9     CT HEAD WO CONTRAST ( ) CLINICAL DATA:  Chronic headaches  EXAM: CT HEAD WITHOUT CONTRAST  TECHNIQUE: Contiguous axial images were obtained from the base of the skull through the vertex without intravenous contrast.  RADIATION DOSE REDUCTION: This exam was performed according to the departmental dose-optimization program which includes automated exposure control, adjustment of the mA and/or kV according to patient size and/or use of iterative reconstruction technique.  COMPARISON:  11/30/2022  FINDINGS: Brain: No mass,hemorrhage or extra-axial collection. Normal appearance of the parenchyma and CSF spaces.  Vascular: No hyperdense vessel or unexpected vascular calcification.  Skull: The visualized skull base, calvarium and extracranial soft tissues are normal.  Sinuses/Orbits: No fluid levels or advanced mucosal thickening of the visualized paranasal sinuses. No mastoid or middle ear effusion. Normal orbits.  Other: None.  IMPRESSION: Normal head  CT.  Electronically Signed   By: Franky Stanford M.D.   On: 12/22/2023 00:55    Pertinent GI Studies   EGD 01/08/24 and Colonoscopy for GI bleed EGD - Normal esophagus. - Gastritis. Biopsied. - There was persistent oozing at the gastric body biopsy sites, presumably from a combination of elevated INR, thrombocytopenia, and overall platelet dysfunction from ESRD. Area was successfully injected with 3 mL PuraStat for hemostasis with cessation of bleeding - Normal examined duodenum. Biopsied Colonoscopy --Prep good - Hemorrhoids found on perianal exam. - One 3 mm polyp in the ascending colon, removed with a hot snare. Resected and retrieved.  Diverticulosis in  the sigmoid colon and in the descending colon.  Non-bleeding internal hemorrhoids   A. STOMACH, BIOPSY:       Gastric antral/oxyntic mucosa with no significant diagnostic  alteration.  No H. pylori identified on HE stain.  Negative for intestinal metaplasia or dysplasia.   B. DUODENUM, BIOPSY:  Duodenal mucosa with preserved villoglandular architecture without  increased intraepithelial lymphocytes or evidence of active  inflammation.  No evidence of gluten sensitive enteropathy.   C. COLON, APPENDICEAL ORIFICE, POLYPECTOMY:       Tubular adenoma.       Negative for high-grade dysplasia.    Colonoscopy 09/13/2021 - Preparation of the colon was inadequate.  - Hemorrhoids found on digital rectal exam.  - Stool in the proximal transverse colon, at the hepatic flexure, in the ascending colon and in the cecum.  - Diverticulosis in the recto- sigmoid colon and in the sigmoid colon.  - Normal mucosa in the rectum, in the recto- sigmoid colon, in the sigmoid colon and in the descending colon.  - Non- bleeding non- thrombosed external and internal hemorrhoids. - Recall 6-12 months with 2-day prep  EGD 09/13/2021 - No gross lesions in esophagus. Z- line irregular, 39 cm from the incisors.  - 2 cm hiatal hernia.  - Non- bleeding  gastric ulcer with a clean ulcer base ( Forrest Class III) . Gastritis throughout. Gastric mapping performed.  - Erythematous duodenopathy in bulb. No gross lesions in the second portion of the duodenum.  EGD 03/17/2021 - No gross lesions in esophagus. Biopsied.  - Z- line irregular, 39 cm from the incisors.  - 1 cm hiatal hernia.  - Non- bleeding gastric ulcer with a clean ulcer base ( Forrest Class III) in antrum. Gastritis throughout - biopsied.  - Erythematous duodenopathy. Biopsied.   Past Medical History:  Diagnosis Date   Anemia    Cirrhosis (HCC)    Diabetes mellitus without complication (HCC)    ESRD (end stage renal disease) on dialysis (HCC)    Hypertension    Paroxysmal atrial fibrillation (HCC)    Type 2 diabetes mellitus (HCC)    Vision loss     Past Surgical History:  Procedure Laterality Date   AV FISTULA PLACEMENT Left 12/23/2021   Procedure: INSERTION OF LEFT ARM BRACHIOCEPHALIC ARTERIOVENOUS FISTULA;  Surgeon: Gretta Lonni PARAS, MD;  Location: [facility] OR;  Service: Vascular;  Laterality: Left;   BACK SURGERY     COLONOSCOPY     COLONOSCOPY N/A 01/08/2024   Procedure: COLONOSCOPY;  Surgeon: San Sandor GAILS, DO;  Location: [facility] ENDOSCOPY;  Service: Gastroenterology;  Laterality: N/A;   ESOPHAGOGASTRODUODENOSCOPY N/A 01/08/2024   Procedure: EGD (ESOPHAGOGASTRODUODENOSCOPY);  Surgeon: San Sandor GAILS, DO;  Location: [facility] ENDOSCOPY;  Service: Gastroenterology;  Laterality: N/A;   IR FLUORO GUIDE CV LINE RIGHT  12/16/2021   IR US  GUIDE VASC ACCESS RIGHT  12/16/2021   PACEMAKER LEADLESS INSERTION N/A 01/15/2023   Procedure: PACEMAKER LEADLESS INSERTION;  Surgeon: Cindie Ole DASEN, MD;  Location: [facility] INVASIVE CV LAB;  Service: Cardiovascular;  Laterality: N/A;   PERICARDIOCENTESIS N/A 04/12/2023   Procedure: PERICARDIOCENTESIS;  Surgeon: Wendel Lurena POUR, MD;  Location[facility] INV[facility] CV LAB;  Service: Cardiovascular;  Laterality: N/A;   TEMPORARY PACEMAKER N/A 01/13/2023    Procedure: TEMPORARY PACEMAKER;  Surgeon: Wendel Lurena POUR, MD;  Location: [facility] INVASIVE CV LAB;  Service: Cardiovascular;  Laterality: N/A;   UPPER GASTROINTESTINAL ENDOSCOPY      Family History  Problem Relation Age of Onset   Diabetes  Brother    Colon cancer Neg Hx    Esophageal cancer Neg Hx    Stomach cancer Neg Hx    Pancreatic cancer Neg Hx    Inflammatory bowel disease Neg Hx    Liver disease Neg Hx    Rectal cancer Neg Hx    Colon polyps Neg Hx     Prior to Admission medications   Medication Sig Start Date End Date Taking? Authorizing Provider  acetaminophen  (TYLENOL ) 500 MG tablet Take 500 mg by mouth every 8 (eight) hours as needed for moderate pain.   Yes [provider]  albuterol  (VENTOLIN  HFA) 108 (90 Base) MCG/ACT inhaler Inhale 1-2 puffs into the lungs every 6 (six) hours as needed for wheezing or shortness of breath. 12/06/22  Yes [provider]  amLODipine  (NORVASC ) 10 MG tablet Take 10 mg by mouth daily.   Yes [provider]  B Complex-C-Zn-Folic Acid  (DIALYVITE 800 WITH ZINC ) 0.8 MG TABS Take 1 tablet by mouth at bedtime. 12/07/23  Yes [provider]  benzonatate  (TESSALON ) 100 MG capsule Take 100 mg by mouth 3 (three) times daily as needed. 09/27/23  Yes [provider]  esomeprazole  (NEXIUM ) 40 MG capsule Take 1 capsule (40 mg total) by mouth 2 (two) times daily before a meal. 01/03/24 07/01/24 Yes Heinz, Camie BRAVO, PA-C  furosemide  (LASIX ) 80 MG tablet Take 80 mg by mouth daily. 01/01/24  Yes [provider]  Glycerin-Hypromellose-PEG 400 (VISINE DRY EYE OP) Apply 1 drop to eye 2 (two) times daily as needed (irritation).   Yes [provider]  ketorolac (ACULAR) 0.5 % ophthalmic solution Place 1 drop into the right eye 4 (four) times daily. 04/11/23  Yes [provider]  lidocaine -prilocaine  (EMLA ) cream Apply 1 Application topically. 01/01/24  Yes [provider]  LOKELMA  10 g PACK packet Take 10  g by mouth daily. 04/12/23  Yes [provider]  metoprolol  succinate (TOPROL -XL) 25 MG 24 hr tablet Take 1 tablet (25 mg total) by mouth daily as needed. 10/18/23  Yes Goodrich, Callie E, PA-C  mirtazapine  (REMERON ) 15 MG tablet Take 15 mg by mouth See admin instructions. Take 1 tablet by mouth nightly on non-dialysis days 10/10/22  Yes [provider]  sevelamer  carbonate (RENVELA ) 800 MG tablet Take 1 tablet (800 mg total) by mouth 3 (three) times daily with meals. Patient taking differently: Take 1,600 mg by mouth 3 (three) times daily with meals. 10/27/22  Yes Leotis Bogus, MD  traMADol  (ULTRAM ) 50 MG tablet Take 50 mg by mouth daily as needed for moderate pain (pain score 4-6). 10/29/23  Yes [provider]  VELPHORO 500 MG chewable tablet Chew 500 mg by mouth 5 (five) times daily. 12/26/23  Yes [provider]  losartan  (COZAAR ) 100 MG tablet Take 100 mg by mouth at bedtime. Patient not taking: Reported on 01/12/2024 09/07/23   [provider]    Current Facility-Administered Medications  Medication Dose Route Frequency Provider Last Rate Last Admin   acetaminophen  (TYLENOL ) tablet 650 mg  650 mg Oral Q6H PRN Franky Redia SAILOR, MD       Or   acetaminophen  (TYLENOL ) suppository 650 mg  650 mg Rectal Q6H PRN Franky Redia SAILOR, MD       insulin  aspart (novoLOG ) injection 0-6 Units  0-6 Units Subcutaneous Q4H Franky Redia SAILOR, MD   1 Units at 01/12/24 0819   pantoprazole  (PROTONIX ) injection 40 mg  40 mg Intravenous Q12H Franky Redia SAILOR, MD  Current Outpatient Medications  Medication Sig Dispense Refill   acetaminophen  (TYLENOL ) 500 MG tablet Take 500 mg by mouth every 8 (eight) hours as needed for moderate pain.     albuterol  (VENTOLIN  HFA) 108 (90 Base) MCG/ACT inhaler Inhale 1-2 puffs into the lungs every 6 (six) hours as needed for wheezing or shortness of breath.     amLODipine  (NORVASC ) 10 MG tablet Take 10 mg by mouth daily.      B Complex-C-Zn-Folic Acid  (DIALYVITE 800 WITH ZINC ) 0.8 MG TABS Take 1 tablet by mouth at bedtime.     benzonatate  (TESSALON ) 100 MG capsule Take 100 mg by mouth 3 (three) times daily as needed.     esomeprazole  (NEXIUM ) 40 MG capsule Take 1 capsule (40 mg total) by mouth 2 (two) times daily before a meal. 60 capsule 3   furosemide  (LASIX ) 80 MG tablet Take 80 mg by mouth daily.     Glycerin-Hypromellose-PEG 400 (VISINE DRY EYE OP) Apply 1 drop to eye 2 (two) times daily as needed (irritation).     ketorolac (ACULAR) 0.5 % ophthalmic solution Place 1 drop into the right eye 4 (four) times daily.     lidocaine -prilocaine  (EMLA ) cream Apply 1 Application topically.     LOKELMA  10 g PACK packet Take 10 g by mouth daily.     metoprolol  succinate (TOPROL -XL) 25 MG 24 hr tablet Take 1 tablet (25 mg total) by mouth daily as needed. 30 tablet 4   mirtazapine  (REMERON ) 15 MG tablet Take 15 mg by mouth See admin instructions. Take 1 tablet by mouth nightly on non-dialysis days     sevelamer  carbonate (RENVELA ) 800 MG tablet Take 1 tablet (800 mg total) by mouth 3 (three) times daily with meals. (Patient taking differently: Take 1,600 mg by mouth 3 (three) times daily with meals.) 90 tablet 1   traMADol  (ULTRAM ) 50 MG tablet Take 50 mg by mouth daily as needed for moderate pain (pain score 4-6).     VELPHORO 500 MG chewable tablet Chew 500 mg by mouth 5 (five) times daily.     losartan  (COZAAR ) 100 MG tablet Take 100 mg by mouth at bedtime. (Patient not taking: Reported on 01/12/2024)      Allergies as of 01/11/2024 - Review Complete 01/11/2024  Allergen Reaction Noted   Chlorhexidine  Rash 06/13/2023   Fish allergy Itching 04/13/2023    Social History   Socioeconomic History   Marital status: Married    Spouse name: Not on file   Number of children: Not on file   Years of education: Not on file   Highest education level: Not on file  Occupational History   Not on file  Tobacco Use   Smoking  status: Never   Smokeless tobacco: Never  Vaping Use   Vaping status: Never Used  Substance and Sexual Activity   Alcohol use: Never    Comment: once in awhile   Drug use: Not Currently    Types: Marijuana    Comment: Use of THC tea 1-2 times per week   Sexual activity: Not on file  Other Topics Concern   Not on file  Social History Narrative   Not on file   Social Drivers of Health   Financial Resource Strain: Not on file  Food Insecurity: No Food Insecurity (04/22/2023)   Hunger Vital Sign    Worried About Running Out of Food in the Last Year: Never true    Ran Out of Food in the Last Year: Never  true  Transportation Needs: No Transportation Needs (04/22/2023)   PRAPARE - Administrator, Civil Service (Medical): No    Lack of Transportation (Non-Medical): No  Physical Activity: Not on file  Stress: Not on file  Social Connections: Unknown (09/14/2022)   Received from [facility]   Social Network    Social Network: Not on file  Intimate Partner Violence: Not At Risk (04/22/2023)   Humiliation, Afraid, Rape, and Kick questionnaire    Fear of Current or Ex-Partner: No    Emotionally Abused: No    Physically Abused: No    Sexually Abused: No     Code Status   Code Status: Full Code  Review of Systems: All systems reviewed and negative except where noted in HPI.  Physical Exam: Vital signs in last 24 hours: Temp:  [97.5 F (36.4 C)-98.1 F (36.7 C)] 98.1 F (36.7 C) (07/12 0544) Pulse Rate:  [85-104] 91 (07/12 0544) Resp:  [16-22] 18 (07/12 0544) BP: (104-135)/(33-68) 122/61 (07/12 0544) SpO2:  [98 %-100 %] 100 % (07/12 0530) Weight:  [71 kg] 71 kg (07/11 2053)    General:  Pleasant female in NAD Psych:  Cooperative. Normal mood and affect Eyes: Pupils equal Ears:  Normal auditory acuity Nose: No deformity, discharge or lesions Neck:  Supple, no masses felt Lungs:  Clear to auscultation.  Heart:  Regular rate, regular rhythm.  Abdomen:   Soft, nondistended, nontender, active bowel sounds, no masses felt Rectal :  Deferred Msk: Symmetrical without gross deformities.  Neurologic:  Alert, oriented, grossly normal neurologically Extremities : No edema Skin:  Intact without significant lesions.    Intake/Output from previous day: No intake/output data recorded. Intake/Output this shift:  No intake/output data recorded.   Vina Dasen, NP-C   01/12/2024, 8:34 AM  Chronic stable NAFLD related cirrhosis, no history of ascites or varices (no ascites seen on ultrasound today), here for acute blood loss anemia on top of chronic cirrhosis related anemia with recent melena (last time yesterday).  Most likely from persistent oozing out of gastric biopsy site based on last Tuesday's EGD report by Dr. San.  Patient hemodynamically stable, is receiving PRBCs and needs dialysis later today (nephrology reportedly aware, according to my conversation with the ED nurse). Plan is for EGD tomorrow after she has dialysis hopefully later today (had interrupted HD session yesterday, BUN and potassium are elevated).  No ascites, so Rocephin  was discontinued  Patient at increased risk for cardiopulmonary complications of procedure due to medical comorbidities.  High degree medical decision making, complex patient

## 2024-01-12 NOTE — H&P (Signed)
 History and Physical    Stacey Chambers FMW:[id number] DOB: 05/24/1951 DOA: 01/11/2024  Patient coming from: Home.  Chief Complaint: Black stools.  Body aches and weakness.  Engineer, structural used.  HPI: Stacey Chambers is a 73 y.o. female with ESRD on hemodialysis, diabetes mellitus type 2, complete heart block status post pacemaker, cirrhosis of the liver who has had recent EGD and colonoscopy on January 08, 2024 when EGD showed gastric body oozing for which Purastat was injected and hemostasis obtained presents to the ER with complaints of having persistent melanotic stools since then and also complains of generalized body ache and fatigue.  ED Course: In the ER patient's hemoglobin was found to be around 6.9 dropped from 11.7 about 10 days ago.  [facility] has been consulted patient placed on Protonix  and transfusion ordered admitted for further GI bleed workup.  Labs show platelets of 140.  Review of Systems: As per HPI, rest all negative.   Past Medical History:  Diagnosis Date   Anemia    Cirrhosis (HCC)    Diabetes mellitus without complication (HCC)    ESRD (end stage renal disease) on dialysis (HCC)    Hypertension    Paroxysmal atrial fibrillation (HCC)    Type 2 diabetes mellitus (HCC)    Vision loss     Past Surgical History:  Procedure Laterality Date   AV FISTULA PLACEMENT Left 12/23/2021   Procedure: INSERTION OF LEFT ARM BRACHIOCEPHALIC ARTERIOVENOUS FISTULA;  Surgeon: Gretta Lonni PARAS, MD;  Location: [facility];  Service: Vascular;  Laterality: Left;   BACK SURGERY     COLONOSCOPY     COLONOSCOPY N/A 01/08/2024   Procedure: COLONOSCOPY;  Surgeon: San Sandor GAILS, DO;  Location: [facility];  Service: Gastroenterology;  Laterality: N/A;   ESOPHAGOGASTRODUODENOSCOPY N/A 01/08/2024   Procedure: EGD (ESOPHAGOGASTRODUODENOSCOPY);  Surgeon: San Sandor GAILS, DO;  Location: [facility];  Service: Gastroenterology;  Laterality: N/A;   IR FLUORO GUIDE CV  LINE RIGHT  12/16/2021   IR US  GUIDE VASC ACCESS RIGHT  12/16/2021   PACEMAKER LEADLESS INSERTION N/A 01/15/2023   Procedure: PACEMAKER LEADLESS INSERTION;  Surgeon: Cindie Ole DASEN, MD;  Location: [facility] INVASIVE [facility];  Service: Cardiovascular;  Laterality: N/A;   PERICARDIOCENTESIS N/A 04/12/2023   Procedure: PERICARDIOCENTESIS;  Surgeon: Wendel Lurena POUR, MD;  Location: [facility] INVASIVE CV LAB;  Service: Cardiovascular;  Laterality: N/A;   TEMPORARY PACEMAKER N/A 01/13/2023   Procedure: TEMPORARY PACEMAKER;  Surgeon: Wendel Lurena POUR, MD;  Location: [facility] INVASIVE CV LAB;  Service: Cardiovascular;  Laterality: N/A;   UPPER GASTROINTESTINAL ENDOSCOPY       reports that she has never smoked. She has never used smokeless tobacco. She reports that she does not currently use drugs after having used the following drugs: Marijuana. She reports that she does not drink alcohol.  Allergies  Allergen Reactions   Chlorhexidine  Rash   Fish Allergy Itching    Family History  Problem Relation Age of Onset   Diabetes Brother    Colon cancer Neg Hx    Esophageal cancer Neg Hx    Stomach cancer Neg Hx    Pancreatic cancer Neg Hx    Inflammatory bowel disease Neg Hx    Liver disease Neg Hx    Rectal cancer Neg Hx    Colon polyps Neg Hx     Prior to Admission medications   Medication Sig Start Date End Date Taking? Authorizing Provider  acetaminophen  (TYLENOL ) 500 MG tablet Take 500 mg by mouth  every 8 (eight) hours as needed for moderate pain.    [provider]  albuterol  (VENTOLIN  HFA) 108 (90 Base) MCG/ACT inhaler Inhale 1-2 puffs into the lungs every 6 (six) hours as needed for wheezing or shortness of breath. 12/06/22   [provider]  amLODipine  (NORVASC ) 5 MG tablet Take 1 tablet (5 mg total) by mouth daily. 04/24/23   Gherghe, Costin M, MD  esomeprazole  (NEXIUM ) 40 MG capsule Take 1 capsule (40 mg total) by mouth 2 (two) times daily before a meal. 01/03/24 07/01/24  Heinz, Camie BRAVO,  PA-C  Glycerin-Hypromellose-PEG 400 (VISINE DRY EYE OP) Apply 1 drop to eye 2 (two) times daily as needed (irritation).    [provider]  ketorolac (ACULAR) 0.5 % ophthalmic solution Place 1 drop into the right eye 4 (four) times daily. 04/11/23   [provider]  LOKELMA  10 g PACK packet Take 10 g by mouth daily. 04/12/23   [provider]  metoprolol  succinate (TOPROL -XL) 25 MG 24 hr tablet Take 1 tablet (25 mg total) by mouth daily as needed. 10/18/23   Goodrich, Callie E, PA-C  mirtazapine  (REMERON ) 15 MG tablet Take 15 mg by mouth See admin instructions. Take 1 tablet by mouth nightly on non-dialysis days 10/10/22   [provider]  sevelamer  carbonate (RENVELA ) 800 MG tablet Take 1 tablet (800 mg total) by mouth 3 (three) times daily with meals. Patient taking differently: Take 1,600 mg by mouth 3 (three) times daily with meals. 10/27/22   Leotis Bogus, MD    Physical Exam: Constitutional: Moderately built and nourished. Vitals:   01/11/24 2315 01/11/24 2330 01/11/24 2345 01/12/24 0045  BP: (!) 128/56 122/67 (!) 112/56 (!) 104/38  Pulse: 89 93 93 85  Resp: 19 16 (!) 22 18  Temp:    (!) 97.5 F (36.4 C)  TempSrc:    Oral  SpO2: 100% 100% 100% 100%  Weight:      Height:       Eyes: Anicteric no pallor. ENMT: No discharge from the ears eyes nose and mouth. Neck: No mass felt.  No neck rigidity. Respiratory: No rhonchi or crepitations. Cardiovascular: S1-S2 heard. Abdomen: Soft nontender bowel sound present. Musculoskeletal: No edema. Skin: No rash. Neurologic: Alert awake oriented to time place and person.  Moves all extremities. Psychiatric: Appears normal.  Normal affect.   Labs on Admission: I have personally reviewed following labs and imaging studies  CBC: Recent Labs  Lab 01/08/24 0919 01/11/24 2114  WBC  --  8.0  HGB 10.9* 6.9*  HCT 32.0* 21.0*  MCV  --  106.1*  PLT  --  140*   Basic Metabolic Panel: Recent Labs  Lab  01/08/24 0919 01/11/24 2114  NA 139 133*  K 4.7 5.2*  CL 101 92*  CO2  --  22  GLUCOSE 113* 358*  BUN 31* 74*  CREATININE 6.80* 4.76*  CALCIUM   --  9.1   GFR: Estimated Creatinine Clearance: 9.2 mL/min (A) (by C-G formula based on SCr of 4.76 mg/dL (H)). Liver Function Tests: Recent Labs  Lab 01/11/24 2114  AST 32  ALT 19  ALKPHOS 84  BILITOT 0.9  PROT 7.4  ALBUMIN  3.2*   Recent Labs  Lab 01/11/24 2114  LIPASE 43   No results for input(s): AMMONIA in the last 168 hours. Coagulation Profile: Recent Labs  Lab 01/11/24 2240  INR 1.5*   Cardiac Enzymes: No results for input(s): CKTOTAL, CKMB, CKMBINDEX, TROPONINI in the last 168 hours.  BNP (last 3 results) No results for input(s): PROBNP in the last 8760 hours. HbA1C: No results for input(s): HGBA1C in the last 72 hours. CBG: No results for input(s): GLUCAP in the last 168 hours. Lipid Profile: No results for input(s): CHOL, HDL, LDLCALC, TRIG, CHOLHDL, LDLDIRECT in the last 72 hours. Thyroid  Function Tests: No results for input(s): TSH, T4TOTAL, FREET4, T3FREE, THYROIDAB in the last 72 hours. Anemia Panel: No results for input(s): VITAMINB12, FOLATE, FERRITIN, TIBC, IRON , RETICCTPCT in the last 72 hours. Urine analysis:    Component Value Date/Time   COLORURINE STRAW (A) 09/16/2022 0730   APPEARANCEUR CLEAR 09/16/2022 0730   LABSPEC 1.006 09/16/2022 0730   PHURINE 9.0 (H) 09/16/2022 0730   GLUCOSEU 50 (A) 09/16/2022 0730   HGBUR MODERATE (A) 09/16/2022 0730   BILIRUBINUR NEGATIVE 09/16/2022 0730   KETONESUR NEGATIVE 09/16/2022 0730   PROTEINUR 100 (A) 09/16/2022 0730   NITRITE NEGATIVE 09/16/2022 0730   LEUKOCYTESUR NEGATIVE 09/16/2022 0730   Sepsis Labs: @LABRCNTIP (procalcitonin:4,lacticidven:4) )No results found for this or any previous visit (from the past 240 hours).   Radiological Exams on Admission: No results found.  EKG: Independently  reviewed.  Sinus tachycardia.  Assessment/Plan Principal Problem:   GI bleeding Active Problems:   History of gastric ulcer   Cirrhosis (HCC)   DM2 (diabetes mellitus, type 2) (HCC)   End stage renal disease on dialysis (HCC)   CHB (complete heart block) (HCC)   S/P placement of leadless cardiac pacemaker   ABLA (acute blood loss anemia)    Acute GI bleeding likely upper GI bleed with recent EGD done on 01/08/2024 showed persistent oozing at the time hemostasis was obtained after PuraStat was injected.  Will keep patient n.p.o. on IV Protonix  transfuse PRBC [facility] has been consulted. Acute blood loss anemia receiving blood transfusion. Thrombocytopenia likely from cirrhosis of the liver.  Follow CBC. ESRD on hemodialysis consult nephrology for dialysis. Diabetes mellitus type 2 not on any antidiabetic medication at this time.  Follow CBGs.  Last hemoglobin A1c more than 2 years ago was 4.3. History of cirrhosis of the liver.  EGD did not show any varices. Complete heart block status post pacemaker placement. Hypertension takes metoprolol  and amlodipine  since blood pressures in the low normal will hold.  Since patient has GI bleed with blood loss anemia will need transfusion and more than 2 midnight stay.   DVT prophylaxis: SCDs. Code Status: Full code. Family Communication: Discussed with patient. Disposition Plan: Progressive care. Consults called: GI. Admission status: Observation.

## 2024-01-12 NOTE — Consult Note (Signed)
 Renal Service Consult Note [facility]  Stacey Chambers 01/12/2024 Stacey [name], MD Requesting Physician: Dr. Jens  Reason for Consult: ESRD pt w/ black stools HPI: The patient is a 73 y.o. year-old w/ PMH as below who presented to ED c/o dark/ tarry stools.  Her dialysis unit told her that her Hgb was low. She couldn't finish her HD yesterday (had 2.5 hrs of HD).  In ED Hb was 6.9 and prbc's were ordered.  Hb was 11 10 days prior. The potassium was 5.6, BUN 95, creatinine 5.1.  In ED BP was 135/46, HR 93, RR 16-22, temp 97.5, 100% O2 sat on room air. GI was consulted and pt was given PPI and transfusion. Pt was admitted to hospitalist service. We are asked to see for dialysis.   Pt seen in room. She is no distress, pleasant and cooperative. Denies any SOB, leg swelling, dialysis issues.   Per chart the patient had recently had EGD and colonoscopy 01/08/2024 (possibly as outpatient) which showed oozing from the gastric body for which she received an injection.   ROS - denies CP, no joint pain, no HA, no blurry vision, no rash, no diarrhea, no nausea/ vomiting   Past Medical History  Past Medical History:  Diagnosis Date   Anemia    Cirrhosis (HCC)    Diabetes mellitus without complication (HCC)    ESRD (end stage renal disease) on dialysis (HCC)    Hypertension    Paroxysmal atrial fibrillation (HCC)    Type 2 diabetes mellitus (HCC)    Vision loss    Past Surgical History  Past Surgical History:  Procedure Laterality Date   AV FISTULA PLACEMENT Left 12/23/2021   Procedure: INSERTION OF LEFT ARM BRACHIOCEPHALIC ARTERIOVENOUS FISTULA;  Surgeon: Gretta Lonni PARAS, MD;  Location: [facility] OR;  Service: Vascular;  Laterality: Left;   BACK SURGERY     COLONOSCOPY     COLONOSCOPY N/A 01/08/2024   Procedure: COLONOSCOPY;  Surgeon: San Sandor GAILS, DO;  Location: [facility];  Service: Gastroenterology;  Laterality: N/A;   ESOPHAGOGASTRODUODENOSCOPY N/A 01/08/2024    Procedure: EGD (ESOPHAGOGASTRODUODENOSCOPY);  Surgeon: San Sandor GAILS, DO;  Location: [facility];  Service: Gastroenterology;  Laterality: N/A;   IR FLUORO GUIDE CV LINE RIGHT  12/16/2021   IR US  GUIDE VASC ACCESS RIGHT  12/16/2021   PACEMAKER LEADLESS INSERTION N/A 01/15/2023   Procedure: PACEMAKER LEADLESS INSERTION;  Surgeon: Cindie Ole DASEN, MD;  Location: [facility] INVASIVE CV LAB;  Service: Cardiovascular;  Laterality: N/A;   PERICARDIOCENTESIS N/A 04/12/2023   Procedure: PERICARDIOCENTESIS;  Surgeon: Wendel Lurena POUR, MD;  [facility] INVAS[facility] CV LAB;  Service: Cardiovascular;  Laterality: N/A;   TEMPORARY PACEMAKER N/A 01/13/2023   Procedure: TEMPORARY PACEMAKER;  Surgeon: Wendel Lurena POUR, MD;  Location: [facility] INVASIVE CV LAB;  Service: Cardiovascular;  Laterality: N/A;   UPPER GASTROINTESTINAL ENDOSCOPY     Family History  Family History  Problem Relation Age of Onset   Diabetes Brother    Colon cancer Neg Hx    Esophageal cancer Neg Hx    Stomach cancer Neg Hx    Pancreatic cancer Neg Hx    Inflammatory bowel disease Neg Hx    Liver disease Neg Hx    Rectal cancer Neg Hx    Colon polyps Neg Hx    Social History  reports that she has never smoked. She has never used smokeless tobacco. She reports that she does not currently use drugs after having used the following  drugs: Marijuana. She reports that she does not drink alcohol. Allergies  Allergies  Allergen Reactions   Chlorhexidine  Rash   Fish Allergy Itching   Home medications Prior to Admission medications   Medication Sig Start Date End Date Taking? Authorizing Provider  acetaminophen  (TYLENOL ) 500 MG tablet Take 500 mg by mouth every 8 (eight) hours as needed for moderate pain.   Yes [provider]  albuterol  (VENTOLIN  HFA) 108 (90 Base) MCG/ACT inhaler Inhale 1-2 puffs into the lungs every 6 (six) hours as needed for wheezing or shortness of breath. 12/06/22  Yes [provider]  amLODipine   (NORVASC ) 10 MG tablet Take 10 mg by mouth daily.   Yes [provider]  B Complex-C-Zn-Folic Acid  (DIALYVITE 800 WITH ZINC ) 0.8 MG TABS Take 1 tablet by mouth at bedtime. 12/07/23  Yes [provider]  benzonatate  (TESSALON ) 100 MG capsule Take 100 mg by mouth 3 (three) times daily as needed. 09/27/23  Yes [provider]  esomeprazole  (NEXIUM ) 40 MG capsule Take 1 capsule (40 mg total) by mouth 2 (two) times daily before a meal. 01/03/24 07/01/24 Yes Heinz, Camie BRAVO, PA-C  furosemide  (LASIX ) 80 MG tablet Take 80 mg by mouth daily. 01/01/24  Yes [provider]  Glycerin-Hypromellose-PEG 400 (VISINE DRY EYE OP) Apply 1 drop to eye 2 (two) times daily as needed (irritation).   Yes [provider]  ketorolac (ACULAR) 0.5 % ophthalmic solution Place 1 drop into the right eye 4 (four) times daily. 04/11/23  Yes [provider]  lidocaine -prilocaine  (EMLA ) cream Apply 1 Application topically. 01/01/24  Yes [provider]  LOKELMA  10 g PACK packet Take 10 g by mouth daily. 04/12/23  Yes [provider]  metoprolol  succinate (TOPROL -XL) 25 MG 24 hr tablet Take 1 tablet (25 mg total) by mouth daily as needed. 10/18/23  Yes Goodrich, Callie E, PA-C  mirtazapine  (REMERON ) 15 MG tablet Take 15 mg by mouth See admin instructions. Take 1 tablet by mouth nightly on non-dialysis days 10/10/22  Yes [provider]  sevelamer  carbonate (RENVELA ) 800 MG tablet Take 1 tablet (800 mg total) by mouth 3 (three) times daily with meals. Patient taking differently: Take 1,600 mg by mouth 3 (three) times daily with meals. 10/27/22  Yes Leotis Bogus, MD  traMADol  (ULTRAM ) 50 MG tablet Take 50 mg by mouth daily as needed for moderate pain (pain score 4-6). 10/29/23  Yes [provider]  VELPHORO 500 MG chewable tablet Chew 500 mg by mouth 5 (five) times daily. 12/26/23  Yes [provider]  losartan  (COZAAR ) 100 MG tablet Take 100 mg by mouth  at bedtime. Patient not taking: Reported on 01/12/2024 09/07/23   [provider]     Vitals:   01/12/24 0544 01/12/24 0830 01/12/24 0900 01/12/24 1400  BP: 122/61 (!) 110/54  (!) 110/44  Pulse: 91 72  81  Resp: 18 17 17 16   Temp: 98.1 F (36.7 C)   98.2 F (36.8 C)  TempSrc: Oral   Oral  SpO2:  100%  99%  Weight:      Height:       Exam Gen alert, no distress No rash, cyanosis or gangrene Sclera anicteric, throat clear  No jvd or bruits Chest clear bilat to bases, no rales/ wheezing RRR no MRG Abd soft ntnd no mass or ascites +bs GU deferred MS no joint effusions or deformity Ext no LE or UE edema, no other edema Neuro is alert, Ox 3 ,  nf    AVF+bruit   Home bp meds: Norvasc  10 every day Lasix  80 mg every day Losartan  100 hs Metoprolol  xl 25 qd   OP HD: MWF SW 3.5h   B350   69kg   2K bath  AVF  Heparin  none Last HD 7/11, post wt 70.8 Good compliance, usually comes off 1.5-2 kg over    Assessment/ Plan: Acute GI bleed: w/ melenic stools. Hb 6.9 on admission (was 11 ten days prior). Pt was given prbc's in ED, and a 2nd unit is in progress. GI planning for EGD tomorrow.  ESRD: on HD MWF. Had 2/3 of her HD yesterday. K+ is 5.9, will plan short HD this evening. F/u K+ in am.  HTN: bp's not high, borderline low at 118/52. Holding home bp lowering meds.  Volume: euvolemic on exam. UF 1-2 L w/ HD this evening.  Anemia of esrd: Hb 5.5- 7 here, getting 2nd unit prbc's transfused this afternoon. Pt is tolerating the anemia well.        Myer Fret  MD [facility] 01/12/2024, 2:37 PM  Recent Labs  Lab 01/11/24 2114 01/12/24 0331  HGB 6.9* 5.7*  ALBUMIN  3.2* 2.9*  CALCIUM  9.1 8.9  CREATININE 4.76* 5.14*  K 5.2* 5.6*   Inpatient medications:  insulin  aspart  0-6 Units Subcutaneous Q4H   pantoprazole  (PROTONIX ) IV  40 mg Intravenous Q12H    acetaminophen  **OR** acetaminophen 

## 2024-01-12 NOTE — Plan of Care (Signed)

## 2024-01-12 NOTE — Progress Notes (Addendum)
 Interval events noted.  She complains of fatigue and feeling a little dizzy.  No abdominal pain, vomiting or bloody/black stools today.  She had black stools yesterday morning.  Vital signs are stable.  She is not tachycardic or hypotensive.  She is tolerating room air.   Stacey Chambers is a 73 y.o. female with ESRD on hemodialysis, diabetes mellitus type 2, complete heart block status post pacemaker, cirrhosis of the liver who has had recent EGD and colonoscopy on January 08, 2024 when EGD showed gastric body oozing for which Purastat was injected and hemostasis obtained  She presented to the ED again on 01/11/2024 with bloody stools and right knee pain that she describes as cramps.She had gotten hemodialysis prior to coming to the ED that day.  She was admitted to the hospital for acute GI bleeding complicated by acute blood loss anemia.  Hemoglobin was 6.9 on admission.  It dropped to 5.7 and came up to 6.2 after 1 unit of packed red blood cells transfusion.  Transfuse another unit of PRBCs.  Monitor H&H and transfuse as needed.  ESRD with hyperkalemia.  Potassium from 5.2-5.6.  Consulted Dr. Geralynn, nephrologist.  Plan for hemodialysis tonight.  Plan for endoscopic workup tomorrow.  Follow-up with gastroenterologist.

## 2024-01-13 ENCOUNTER — Inpatient Hospital Stay (HOSPITAL_COMMUNITY): Admitting: Anesthesiology

## 2024-01-13 ENCOUNTER — Encounter (HOSPITAL_COMMUNITY)
Admission: EM | Disposition: A | Discharge status: Home / Self Care | Payer: Self-pay | Source: Home / Self Care | Attending: Internal Medicine

## 2024-01-13 DIAGNOSIS — K766 Portal hypertension: Secondary | ICD-10-CM

## 2024-01-13 DIAGNOSIS — K259 Gastric ulcer, unspecified as acute or chronic, without hemorrhage or perforation: Secondary | ICD-10-CM | POA: Diagnosis not present

## 2024-01-13 DIAGNOSIS — K921 Melena: Secondary | ICD-10-CM

## 2024-01-13 DIAGNOSIS — N186 End stage renal disease: Secondary | ICD-10-CM

## 2024-01-13 DIAGNOSIS — K922 Gastrointestinal hemorrhage, unspecified: Secondary | ICD-10-CM | POA: Diagnosis not present

## 2024-01-13 DIAGNOSIS — I132 Hypertensive heart and chronic kidney disease with heart failure and with stage 5 chronic kidney disease, or end stage renal disease: Secondary | ICD-10-CM | POA: Diagnosis not present

## 2024-01-13 DIAGNOSIS — I5032 Chronic diastolic (congestive) heart failure: Secondary | ICD-10-CM

## 2024-01-13 DIAGNOSIS — K3189 Other diseases of stomach and duodenum: Secondary | ICD-10-CM

## 2024-01-13 HISTORY — PX: ESOPHAGOGASTRODUODENOSCOPY: SHX5428

## 2024-01-13 HISTORY — PX: HEMOSTASIS CLIP PLACEMENT: SHX6857

## 2024-01-13 LAB — CBC
HCT: 23.1 % — ABNORMAL LOW (ref 36.0–46.0)
Hemoglobin: 7.9 g/dL — ABNORMAL LOW (ref 12.0–15.0)
MCH: 32.1 pg (ref 26.0–34.0)
MCHC: 34.2 g/dL (ref 30.0–36.0)
MCV: 93.9 fL (ref 80.0–100.0)
Platelets: 94 K/uL — ABNORMAL LOW (ref 150–400)
RBC: 2.46 MIL/uL — ABNORMAL LOW (ref 3.87–5.11)
RDW: 21.5 % — ABNORMAL HIGH (ref 11.5–15.5)
WBC: 5.9 K/uL (ref 4.0–10.5)
nRBC: 1.5 % — ABNORMAL HIGH (ref 0.0–0.2)

## 2024-01-13 LAB — BASIC METABOLIC PANEL WITH GFR
Anion gap: 15 (ref 5–15)
BUN: 61 mg/dL — ABNORMAL HIGH (ref 8–23)
CO2: 24 mmol/L (ref 22–32)
Calcium: 8.6 mg/dL — ABNORMAL LOW (ref 8.9–10.3)
Chloride: 93 mmol/L — ABNORMAL LOW (ref 98–111)
Creatinine, Ser: 4.28 mg/dL — ABNORMAL HIGH (ref 0.44–1.00)
GFR, Estimated: 11 mL/min — ABNORMAL LOW (ref >60)
Glucose, Bld: 96 mg/dL (ref 70–99)
Potassium: 3.8 mmol/L (ref 3.5–5.1)
Sodium: 132 mmol/L — ABNORMAL LOW (ref 135–145)

## 2024-01-13 LAB — GLUCOSE, CAPILLARY
Glucose-Capillary: 107 mg/dL — ABNORMAL HIGH (ref 70–99)
Glucose-Capillary: 116 mg/dL — ABNORMAL HIGH (ref 70–99)
Glucose-Capillary: 142 mg/dL — ABNORMAL HIGH (ref 70–99)
Glucose-Capillary: 79 mg/dL (ref 70–99)
Glucose-Capillary: 83 mg/dL (ref 70–99)
Glucose-Capillary: 95 mg/dL (ref 70–99)

## 2024-01-13 SURGERY — EGD (ESOPHAGOGASTRODUODENOSCOPY)
Anesthesia: Monitor Anesthesia Care

## 2024-01-13 MED ORDER — GLYCOPYRROLATE PF 0.2 MG/ML IJ SOSY
PREFILLED_SYRINGE | INTRAMUSCULAR | Status: DC | PRN
  Administered 2024-01-13: .1 mg via INTRAVENOUS

## 2024-01-13 MED ORDER — PROPOFOL 500 MG/50ML IV EMUL
INTRAVENOUS | Status: DC | PRN
  Administered 2024-01-13: 125 ug/kg/min via INTRAVENOUS

## 2024-01-13 MED ORDER — LIDOCAINE 2% (20 MG/ML) 5 ML SYRINGE
INTRAMUSCULAR | Status: DC | PRN
  Administered 2024-01-13: 80 mg via INTRAVENOUS

## 2024-01-13 MED ORDER — PROPOFOL 10 MG/ML IV BOLUS
INTRAVENOUS | Status: DC | PRN
  Administered 2024-01-13: 50 mg via INTRAVENOUS

## 2024-01-13 MED ORDER — PHENYLEPHRINE 80 MCG/ML (10ML) SYRINGE FOR IV PUSH (FOR BLOOD PRESSURE SUPPORT)
PREFILLED_SYRINGE | INTRAVENOUS | Status: DC | PRN
  Administered 2024-01-13: 80 ug via INTRAVENOUS

## 2024-01-13 NOTE — Progress Notes (Addendum)
 Progress Note    Tharon Kitch  FMW:[id number] DOB: May 26, 1951  DOA: 01/11/2024 PCP: Sigrid Deidra Fox, MD      Brief Narrative:    Medical records reviewed and are as summarized below:  Stacey Chambers is a 73 y.o. female with ESRD on hemodialysis, diabetes mellitus type 2, complete heart block status post pacemaker, cirrhosis of the liver who has had recent EGD and colonoscopy on January 08, 2024 when EGD showed gastric body oozing for which Purastat was injected and hemostasis obtained  She presented to the ED again on 01/11/2024 with bloody stools and right knee pain that she describes as cramps.She had gotten hemodialysis prior to coming to the ED that day.   She was admitted to the hospital for acute GI bleeding complicated by acute blood loss anemia.  Hemoglobin was 6.9 on admission.  It dropped to 5.7 and came up to 6.2 after 1 unit of packed red blood cells transfusion.      Assessment/Plan:   Principal Problem:   GI bleeding Active Problems:   History of gastric ulcer   Cirrhosis (HCC)   DM2 (diabetes mellitus, type 2) (HCC)   End stage renal disease on dialysis (HCC)   CHB (complete heart block) (HCC)   S/P placement of leadless cardiac pacemaker   ABLA (acute blood loss anemia)    Body mass index is 32.71 kg/m.  (Class I obesity)    Acute GI bleeding: Continue IV Protonix .  Continue IV ceftriaxone  because of history of liver cirrhosis.  Plan for EGD today.  Follow-up with gastroenterologist. Recent EGD and colonoscopy on January 08, 2023 and EGD showed gastric body oozing for which.  Start was injected and hemostasis obtained.   Acute blood loss anemia: Hemoglobin up to 7.9.  Monitor H&H and transfuse as needed.  S/p transfusion with 2 units of PRBCs.  Hemoglobin was down to 5.7.  Hemoglobin was 6.9 on admission.   ESRD: Patient had hemodialysis earlier this morning.  Follow-up with nephrologist. Hyperkalemia: Improved   History of  complete heart block s/p permanent pacemaker.   Comorbidities include chronic thrombocytopenia, history of gastric ulcer   Addendum  Discussed EGD findings with her.  Discussed disposition with plan to discharge home today.  However, she says she feels tired does not feel comfortable going home today. Hemoglobin is up to 7.9 s/p transfusion with 2 units of PRBCs. Will consider repeat blood transfusion with hemodialysis tomorrow if hemoglobin remains low. Discussed with patient and Dorn, RN   Diet Order             Diet NPO time specified Except for: Sips with Meds  Diet effective midnight                            Consultants: Gastroenterologist Nephrologist  Procedures: Plan for EGD today    Medications:    insulin  aspart  0-6 Units Subcutaneous Q4H   pantoprazole  (PROTONIX ) IV  40 mg Intravenous Q12H   Continuous Infusions:  sodium chloride  20 mL/hr at 01/12/24 1900     Anti-infectives (From admission, onward)    Start     Dose/Rate Route Frequency Ordered Stop   01/12/24 1000  cefTRIAXone  (ROCEPHIN ) 2 g in sodium chloride  0.9 % 100 mL IVPB  Status:  Discontinued        2 g 200 mL/hr over 30 Minutes Intravenous Every 24 hours 01/12/24 0947 01/12/24 1314  Family Communication/Anticipated D/C date and plan/Code Status   DVT prophylaxis: SCDs Start: 01/12/24 0251     Code Status: Full Code  Family Communication: None Disposition Plan: Plan to discharge home   Status is: Inpatient Remains inpatient appropriate because: Acute GI bleeding complicated by acute blood loss anemia       Subjective:   Interval events noted.  No complaints.  Dizziness and fatigue have improved.  No bowel movement since admission.  No black stools, vomiting or abdominal pain.  Objective:    Vitals:   01/13/24 0520 01/13/24 0550 01/13/24 0620 01/13/24 0814  BP: (!) 97/45 (!) 111/57 (!) 116/53 (!) 120/51  Pulse: 75 86 75 77   Resp: 13 14 15 16   Temp:   98 F (36.7 C) 97.6 F (36.4 C)  TempSrc:   Oral Oral  SpO2: 99% 100% 99% 100%  Weight:      Height:       No data found.   Intake/Output Summary (Last 24 hours) at 01/13/2024 0920 Last data filed at 01/13/2024 0620 Gross per 24 hour  Intake 772.5 ml  Output 1500 ml  Net -727.5 ml   Filed Weights   01/11/24 2053  Weight: 71 kg    Exam:  GEN: NAD SKIN: Warm and dry EYES: No pallor or icterus ENT: MMM CV: RRR PULM: CTA B ABD: soft, ND, NT, +BS CNS: AAO x 3, non focal EXT: No edema or tenderness        Data Reviewed:   I have personally reviewed following labs and imaging studies:  Labs: Labs show the following:   Basic Metabolic Panel: Recent Labs  Lab 01/08/24 0919 01/11/24 2114 01/12/24 0331  NA 139 133* 133*  K 4.7 5.2* 5.6*  CL 101 92* 94*  CO2  --  22 22  GLUCOSE 113* 358* 200*  BUN 31* 74* 95*  CREATININE 6.80* 4.76* 5.14*  CALCIUM   --  9.1 8.9   GFR Estimated Creatinine Clearance: 8.5 mL/min (A) (by C-G formula based on SCr of 5.14 mg/dL (H)). Liver Function Tests: Recent Labs  Lab 01/11/24 2114 01/12/24 0331  AST 32 26  ALT 19 17  ALKPHOS 84 63  BILITOT 0.9 0.7  PROT 7.4 6.7  ALBUMIN  3.2* 2.9*   Recent Labs  Lab 01/11/24 2114  LIPASE 43   No results for input(s): AMMONIA in the last 168 hours. Coagulation profile Recent Labs  Lab 01/11/24 2240 01/12/24 0331  INR 1.5* 1.4*    CBC: Recent Labs  Lab 01/08/24 0919 01/11/24 2114 01/12/24 0331 01/12/24 1424  WBC  --  8.0 8.0 11.7*  HGB 10.9* 6.9* 5.7* 6.2*  HCT 32.0* 21.0* 17.6* 18.2*  MCV  --  106.1* 107.3* 98.4  PLT  --  140* 97* 96*   Cardiac Enzymes: No results for input(s): CKTOTAL, CKMB, CKMBINDEX, TROPONINI in the last 168 hours. BNP (last 3 results) No results for input(s): PROBNP in the last 8760 hours. CBG: Recent Labs  Lab 01/12/24 1151 01/12/24 1648 01/12/24 1937 01/13/24 0019 01/13/24 0812  GLUCAP  116* 121* 135* 95 107*   D-Dimer: No results for input(s): DDIMER in the last 72 hours. Hgb A1c: No results for input(s): HGBA1C in the last 72 hours. Lipid Profile: No results for input(s): CHOL, HDL, LDLCALC, TRIG, CHOLHDL, LDLDIRECT in the last 72 hours. Thyroid  function studies: No results for input(s): TSH, T4TOTAL, T3FREE, THYROIDAB in the last 72 hours.  Invalid input(s): FREET3 Anemia work up: No results for input(s):  VITAMINB12, FOLATE, FERRITIN, TIBC, IRON , RETICCTPCT in the last 72 hours. Sepsis Labs: Recent Labs  Lab 01/11/24 2114 01/12/24 0331 01/12/24 1424  WBC 8.0 8.0 11.7*    Microbiology No results found for this or any previous visit (from the past 240 hours).  Procedures and diagnostic studies:  US  Abdomen Complete Result Date: 01/12/2024 CLINICAL DATA:  Abdominal pain.  History of cirrhosis. EXAM: ABDOMEN ULTRASOUND COMPLETE COMPARISON:  Multiple prior imaging studies. The most recent ultrasound examination is 12/01/2021 FINDINGS: Gallbladder: No gallstones or wall thickening visualized. No sonographic Murphy sign noted by sonographer. Common bile duct: Diameter: 3.4 mm Liver: Cirrhotic changes involving the liver with irregular liver contour and increased and coarse echogenicity. No focal hepatic lesions are identified. Portal vein is patent on color Doppler imaging with normal direction of blood flow towards the liver. IVC: Normal caliber. Pancreas: Visualized portion unremarkable. Spleen: Within normal limits in size. Right Kidney: Length: 5.8 cm. Renal cortical atrophy and increased echogenicity suggesting medical renal disease. No hydronephrosis. Left Kidney: Length: 8.4 cm. Renal cortical atrophy and increased echogenicity suggesting medical renal disease. No hydronephrosis. Simple 3 cm renal cyst. Abdominal aorta: Normal caliber. Limited visualization due to overlying bowel gas. Other findings: No ascites. IMPRESSION: 1.  Cirrhotic changes involving the liver but no focal hepatic lesions. 2. No ascites. 3. No gallstones findings for acute cholecystitis. 4. Bilateral renal cortical atrophy and increased echogenicity suggesting medical renal disease. 5. No ascites. Electronically Signed   By: MYRTIS Stammer M.D.   On: 01/12/2024 12:12               LOS: 1 day   Journei Thomassen  [facility]   Pager on [email or web address]. If 7PM-7AM, please contact night-coverage at [email or web address]     01/13/2024, 9:20 AM

## 2024-01-13 NOTE — Op Note (Signed)
 [facility] Patient Name: Stacey Chambers Procedure Date : 01/13/2024 MRN: [id number] Attending MD: Victory CROME. Legrand , MD, [id number] Date of Birth: 11-21-1950 CSN: [id number] Age: 73 Admit Type: Inpatient Procedure:                Upper GI endoscopy Indications:              Acute post hemorrhagic anemia, Melena Providers:                Victory L. Legrand, MD, Robie Breed, RN,                            Joya Bunnell, Technician Referring MD:             [facility] Medicines:                Monitored Anesthesia Care Complications:            No immediate complications. Estimated Blood Loss:     Estimated blood loss: none. Procedure:                Pre-Anesthesia Assessment:                           - Prior to the procedure, a History and Physical                            was performed, and patient medications and                            allergies were reviewed. The patient's tolerance of                            previous anesthesia was also reviewed. The risks                            and benefits of the procedure and the sedation                            options and risks were discussed with the patient.                            All questions were answered, and informed consent                            was obtained. Prior Anticoagulants: The patient has                            taken no anticoagulant or antiplatelet agents. ASA                            Grade Assessment: IV - A patient with severe                            systemic disease that is a constant threat to life.  After reviewing the risks and benefits, the patient                            was deemed in satisfactory condition to undergo the                            procedure.                           After obtaining informed consent, the endoscope was                            passed under direct vision. Throughout the                             procedure, the patient's blood pressure, pulse, and                            oxygen saturations were monitored continuously. The                            GIF-H190 ([id number]) Olympus endoscope was introduced                            through the mouth, and advanced to the second part                            of duodenum. The upper GI endoscopy was                            accomplished without difficulty. The patient                            tolerated the procedure well. Scope In: Scope Out: Findings:      The esophagus was normal.      Mild portal hypertensive gastropathy was found in the entire examined       stomach.      Two non-bleeding superficial gastric ulcers with pigmented material were       found on the greater curvature of the gastric body and on the lesser       curvature of the gastric body. The shallow ulcers are biopsy sites from       the recent upper endoscopy. Both lesions were approximately 5 mm in       largest dimension with a little surrounding erythema. For hemostasis,       two hemostatic clips were successfully placed (MR conditional).      The exam of the stomach was otherwise normal.      The examined duodenum was normal. Impression:               - Normal esophagus.                           - Portal hypertensive gastropathy.                           -  Non-bleeding gastric ulcers with pigmented                            material (from recent biopsy sites). Clips (MR                            conditional) were placed.                           - Normal examined duodenum.                           - No specimens collected. Recommendation:           - Return patient to hospital ward for possible                            discharge same day. Hospitalist will be messaged                           - Resume regular diet.                           - Message will be sent to Dr. San about the                            events of this  hospitalization in order to arrange                            appropriate follow-up.                           Patient will be updated via video phone interpreter                            and copy of the report given. Procedure Code(s):        --- Professional ---                           [id number], Esophagogastroduodenoscopy, flexible,                            transoral; with control of bleeding, any method Diagnosis Code(s):        --- Professional ---                           K76.6, Portal hypertension                           K31.89, Other diseases of stomach and duodenum                           K25.9, Gastric ulcer, unspecified as acute or                            chronic, without hemorrhage or perforation  D62, Acute posthemorrhagic anemia                           K92.1, Melena (includes Hematochezia) CPT copyright 2022 American Medical Association. All rights reserved. The codes documented in this report are preliminary and upon coder review may  be revised to meet current compliance requirements. Dreana Britz L. Legrand, MD 01/13/2024 11:48:50 AM This report has been signed electronically. Number of Addenda: 0

## 2024-01-13 NOTE — Progress Notes (Signed)
Patient transferred to HD 

## 2024-01-13 NOTE — Interval H&P Note (Signed)
 History and Physical Interval Note:  01/13/2024 11:09 AM  Stacey Chambers  has presented today for surgery, with the diagnosis of Melena.  The various methods of treatment have been discussed with the patient and family. After consideration of risks, benefits and other options for treatment, the patient has consented to  Procedure(s): EGD (ESOPHAGOGASTRODUODENOSCOPY) (N/A) as a surgical intervention.  The patient's history has been reviewed, patient examined, no change in status, stable for surgery.  I have reviewed the patient's chart and labs.  Questions were answered to the patient's satisfaction.    Patient seen in endoscopy pre-procedure area with [name] videophone interpreter.  Hgb 7.9 after Tfx and BMP improved after last evening's HD Patient stable and ready to proceed with EGD  Victory LITTIE Brand III

## 2024-01-13 NOTE — Plan of Care (Signed)
  Problem: Skin Integrity: Goal: Risk for impaired skin integrity will decrease Outcome: Progressing   Problem: Education: Goal: Knowledge of General Education information will improve Description: Including pain rating scale, medication(s)/side effects and non-pharmacologic comfort measures Outcome: Progressing   Problem: Activity: Goal: Risk for activity intolerance will decrease Outcome: Progressing   Problem: Coping: Goal: Level of anxiety will decrease Outcome: Progressing

## 2024-01-13 NOTE — Progress Notes (Signed)
  [facility] Progress Note   Subjective: Seen in room. Just back from dialysis this morning. Feeling ok. No complaints. For EGD today.   Objective Vitals:   01/13/24 1141 01/13/24 1144 01/13/24 1150 01/13/24 1219  BP: (!) 130/54  139/61 (!) 116/93  Pulse: 75 75 74 79  Resp: 13 13 13 18   Temp: 98 F (36.7 C)     TempSrc: Oral     SpO2: 99% 100% 98% 99%  Weight:      Height:        Additional Objective Labs: Basic Metabolic Panel: Recent Labs  Lab 01/11/24 2114 01/12/24 0331 01/13/24 0832  NA 133* 133* 132*  K 5.2* 5.6* 3.8  CL 92* 94* 93*  CO2 22 22 24   GLUCOSE 358* 200* 96  BUN 74* 95* 61*  CREATININE 4.76* 5.14* 4.28*  CALCIUM  9.1 8.9 8.6*   CBC: Recent Labs  Lab 01/11/24 2114 01/12/24 0331 01/12/24 1424 01/13/24 0832  WBC 8.0 8.0 11.7* 5.9  HGB 6.9* 5.7* 6.2* 7.9*  HCT 21.0* 17.6* 18.2* 23.1*  MCV 106.1* 107.3* 98.4 93.9  PLT 140* 97* 96* 94*   Blood Culture    Component Value Date/Time   SDES FLUID PERICARDIAL 04/12/2023 1555   SPECREQUEST NONE 04/12/2023 1555   CULT  04/12/2023 1555    NO GROWTH 3 DAYS Performed at [facility], [address], [place], [place] [address]    REPTSTATUS 04/16/2023 FINAL 04/12/2023 1555    Physical Exam General: Alert, nad  Heart: RRR Lungs: Clear, normal wob Abdomen: nontender Extremities: no LE edema  Dialysis Access: AVF +bruit   Medications:   insulin  aspart  0-6 Units Subcutaneous Q4H   pantoprazole  (PROTONIX ) IV  40 mg Intravenous Q12H    Dialysis Orders:   MWF SW 3.5h   B350   69kg   2K bath  AVF  Heparin  none Last HD 7/11, post wt 70.8  Assessment/Plan: ABLA/Recurrent GIB. Hb 5.7 on admit. Transfused 2 u prbcs. GI consulted. Recent EGD on 7/8 with gastritis. For EGD again today.  ESRD. HD MWF. Continue on schedule. HD Monday.  HTN/volume. BP acceptable. UF to EDW as tolerated.  Anemia. Last ESA dosed 7/7. Hb 10.8 on 7/9. Getting IV Fe load as OP MBD of CKD. Continue  binders when taking PO.  Liver cirrhosis. S/p biopsy. 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 GI following    Maisie Ronnald Acosta PA-C  [facility] 01/13/2024,1:59 PM

## 2024-01-13 NOTE — Anesthesia Preprocedure Evaluation (Addendum)
 Anesthesia Evaluation  Patient identified by MRN, date of birth, ID band Patient awake    Reviewed: Allergy & Precautions, H&P , NPO status , Patient's Chart, lab work & pertinent test results, reviewed documented beta blocker date and time   Airway Mallampati: II  TM Distance: >3 FB Neck ROM: Full    Dental no notable dental hx. (+) Edentulous Upper, Edentulous Lower, Dental Advisory Given   Pulmonary neg pulmonary ROS   Pulmonary exam normal breath sounds clear to auscultation       Cardiovascular hypertension, Pt. on medications and Pt. on home beta blockers + dysrhythmias Atrial Fibrillation + Valvular Problems/Murmurs  Rhythm:Regular Rate:Normal     Neuro/Psych negative neurological ROS  negative psych ROS   GI/Hepatic PUD,,,(+) Cirrhosis         Endo/Other  diabetes    Renal/GU ESRF and DialysisRenal disease  negative genitourinary   Musculoskeletal   Abdominal   Peds  Hematology  (+) Blood dyscrasia, anemia   Anesthesia Other Findings   Reproductive/Obstetrics negative OB ROS                              Anesthesia Physical Anesthesia Plan  ASA: 3  Anesthesia Plan: MAC   Post-op Pain Management: Minimal or no pain anticipated   Induction: Intravenous  PONV Risk Score and Plan: 2 and Propofol  infusion  Airway Management Planned: Natural Airway and Simple Face Mask  Additional Equipment:   Intra-op Plan:   Post-operative Plan:   Informed Consent: I have reviewed the patients History and Physical, chart, labs and discussed the procedure including the risks, benefits and alternatives for the proposed anesthesia with the patient or authorized representative who has indicated his/her understanding and acceptance.     Dental advisory given  Plan Discussed with: CRNA  Anesthesia Plan Comments:          Anesthesia Quick Evaluation

## 2024-01-13 NOTE — Progress Notes (Signed)
 Received patient in bed to unit.  Alert and oriented.  Informed consent signed and in chart.   TX duration:2 hours  Patient tolerated well.  Transported back to the room  Alert, without acute distress.  Hand-off given to patient's nurse.   Access used: fistula Access issues: none  Total UF removed: 1500 ml Medication(s) given: none Post HD VS: 116/53 Post HD weight: unable to obtain   01/13/24 0620  Vitals  Temp 98 F (36.7 C)  Temp Source Oral  BP (!) 116/53  MAP (mmHg) 66  BP Location Right Arm  BP Method Automatic  Patient Position (if appropriate) Lying  Pulse Rate 75  Pulse Rate Source Monitor  ECG Heart Rate 74  Resp 15  Oxygen Therapy  SpO2 99 %  O2 Device Room Air  Patient Activity (if Appropriate) In bed  Pulse Oximetry Type Continuous  During Treatment Monitoring  Blood Flow Rate (mL/min) 0 mL/min  Arterial Pressure (mmHg) -0.2 mmHg  Venous Pressure (mmHg) -0.8 mmHg  TMP (mmHg) -51.31 mmHg  Ultrafiltration Rate (mL/min) 1058 mL/min  Dialysate Flow Rate (mL/min) 0 ml/min  Duration of HD Treatment -hour(s) 2 hour(s)  Cumulative Fluid Removed (mL) per Treatment  1500.21  Post Treatment  Dialyzer Clearance Lightly streaked  Hemodialysis Intake (mL) 0 mL  Liters Processed 42  Fluid Removed (mL) 1500 mL  Tolerated HD Treatment Yes  Post-Hemodialysis Comments HD tx achieved as expected, tolerated well, pis stable  AVG/AVF Arterial Site Held (minutes) 10 minutes  AVG/AVF Venous Site Held (minutes) 10 minutes  Note  Patient Observations pt is in bed resting  Fistula / Graft Left Upper arm Arteriovenous fistula  Placement Date/Time: 12/23/21 0816   Orientation: Left  Access Location: (c) Upper arm  Access Type: Arteriovenous fistula  Site Condition No complications  Fistula / Graft Assessment Thrill;Present;Bruit;Aneurysm present  Status Deaccessed  Drainage Description None      [facility]

## 2024-01-13 NOTE — Anesthesia Postprocedure Evaluation (Signed)
 Anesthesia Post Note  Patient: [name]  Procedure(s) Performed: EGD (ESOPHAGOGASTRODUODENOSCOPY) CONTROL OF HEMORRHAGE, GI TRACT, ENDOSCOPIC, BY CLIPPING OR OVERSEWING     Patient location during evaluation: Endoscopy Anesthesia Type: MAC Level of consciousness: awake and alert Pain management: pain level controlled Vital Signs Assessment: post-procedure vital signs reviewed and stable Respiratory status: spontaneous breathing, nonlabored ventilation and respiratory function stable Cardiovascular status: stable and blood pressure returned to baseline Postop Assessment: no apparent nausea or vomiting Anesthetic complications: no   No notable events documented.  Last Vitals:  Vitals:   01/13/24 1144 01/13/24 1150  BP:  139/61  Pulse: 75 74  Resp: 13 13  Temp:    SpO2: 100% 98%    Last Pain:  Vitals:   01/13/24 1141  TempSrc: Oral  PainSc: 0-No pain                 Caylen Yardley,W. EDMOND

## 2024-01-13 NOTE — Transfer of Care (Signed)
 Immediate Anesthesia Transfer of Care Note  Patient: Stacey Chambers  Procedure(s) Performed: EGD (ESOPHAGOGASTRODUODENOSCOPY) CONTROL OF HEMORRHAGE, GI TRACT, ENDOSCOPIC, BY CLIPPING OR OVERSEWING  Patient Location: PACU  Anesthesia Type:MAC  Level of Consciousness: drowsy  Airway & Oxygen Therapy: Patient Spontanous Breathing and Patient connected to nasal cannula oxygen  Post-op Assessment: Report given to RN and Post -op Vital signs reviewed and stable  Post vital signs: Reviewed and stable  Last Vitals:  Vitals Value Taken Time  BP    Temp    Pulse    Resp    SpO2      Last Pain:  Vitals:   01/13/24 1059  TempSrc: Temporal  PainSc: 0-No pain         Complications: No notable events documented.

## 2024-01-14 ENCOUNTER — Encounter (HOSPITAL_COMMUNITY): Payer: Self-pay | Admitting: Gastroenterology

## 2024-01-14 DIAGNOSIS — K922 Gastrointestinal hemorrhage, unspecified: Secondary | ICD-10-CM | POA: Diagnosis not present

## 2024-01-14 LAB — CBC
HCT: 23.7 % — ABNORMAL LOW (ref 36.0–46.0)
Hemoglobin: 7.9 g/dL — ABNORMAL LOW (ref 12.0–15.0)
MCH: 32.2 pg (ref 26.0–34.0)
MCHC: 33.3 g/dL (ref 30.0–36.0)
MCV: 96.7 fL (ref 80.0–100.0)
Platelets: 84 K/uL — ABNORMAL LOW (ref 150–400)
RBC: 2.45 MIL/uL — ABNORMAL LOW (ref 3.87–5.11)
RDW: 21.6 % — ABNORMAL HIGH (ref 11.5–15.5)
WBC: 5.1 K/uL (ref 4.0–10.5)
nRBC: 1 % — ABNORMAL HIGH (ref 0.0–0.2)

## 2024-01-14 LAB — BASIC METABOLIC PANEL WITH GFR
Anion gap: 16 — ABNORMAL HIGH (ref 5–15)
BUN: 90 mg/dL — ABNORMAL HIGH (ref 8–23)
CO2: 23 mmol/L (ref 22–32)
Calcium: 8.3 mg/dL — ABNORMAL LOW (ref 8.9–10.3)
Chloride: 96 mmol/L — ABNORMAL LOW (ref 98–111)
Creatinine, Ser: 7.35 mg/dL — ABNORMAL HIGH (ref 0.44–1.00)
GFR, Estimated: 6 mL/min — ABNORMAL LOW (ref >60)
Glucose, Bld: 128 mg/dL — ABNORMAL HIGH (ref 70–99)
Potassium: 5.8 mmol/L — ABNORMAL HIGH (ref 3.5–5.1)
Sodium: 135 mmol/L (ref 135–145)

## 2024-01-14 LAB — GLUCOSE, CAPILLARY
Glucose-Capillary: 101 mg/dL — ABNORMAL HIGH (ref 70–99)
Glucose-Capillary: 118 mg/dL — ABNORMAL HIGH (ref 70–99)
Glucose-Capillary: 120 mg/dL — ABNORMAL HIGH (ref 70–99)
Glucose-Capillary: 123 mg/dL — ABNORMAL HIGH (ref 70–99)
Glucose-Capillary: 132 mg/dL — ABNORMAL HIGH (ref 70–99)

## 2024-01-14 MED ORDER — HEPARIN SODIUM (PORCINE) 1000 UNIT/ML DIALYSIS: 1000.0000 [IU] | INTRAMUSCULAR | Status: DC | PRN

## 2024-01-14 MED ORDER — LIDOCAINE-PRILOCAINE 2.5-2.5 % EX CREA: 1.0000 | TOPICAL_CREAM | CUTANEOUS | Status: DC | PRN

## 2024-01-14 MED ORDER — ACETAMINOPHEN 325 MG PO TABS
ORAL_TABLET | ORAL | Status: AC
  Filled 2024-01-14: qty 2

## 2024-01-14 MED ORDER — PENTAFLUOROPROP-TETRAFLUOROETH EX AERO: 1.0000 | INHALATION_SPRAY | CUTANEOUS | Status: DC | PRN

## 2024-01-14 MED ORDER — ANTICOAGULANT SODIUM CITRATE 4% (200MG/5ML) IV SOLN: 5.0000 mL | Status: DC | PRN

## 2024-01-14 MED ORDER — LIDOCAINE HCL (PF) 1 % IJ SOLN: 5.0000 mL | INTRAMUSCULAR | Status: DC | PRN

## 2024-01-14 NOTE — TOC Transition Note (Signed)
 Transition of Care [facility]) - Discharge Note   Patient Details  Name: Stacey Chambers MRN: [id number] Date of Birth: 1951-05-28  [facility] CM/SW Contact:  Roxie KANDICE Stain, RN Phone Number: 01/14/2024, 11:58 AM   Clinical Narrative:    Stacey Chambers is stable to discharge home.  Patient to follow up with PCP in one week. No TOC needs at this time.   Final next level of care: Home/Self Care Barriers to Discharge: Barriers Resolved   Patient Goals and CMS Choice Patient states their goals for this hospitalization and ongoing recovery are:: return home          Discharge Placement                 Home      Discharge Plan and Services Additional resources added to the After Visit Summary for                                       Social Drivers of Health (SDOH) Interventions SDOH Screenings   Food Insecurity: No Food Insecurity (01/13/2024)  Housing: Unknown (01/13/2024)  Transportation Needs: No Transportation Needs (01/13/2024)  Utilities: Not At Risk (01/13/2024)  Depression (PHQ2-9): Low Risk  (07/12/2022)  Social Connections: Moderately Integrated (01/13/2024)  Tobacco Use: Low Risk  (01/12/2024)     Readmission Risk Interventions    04/24/2023   10:14 AM  Readmission Risk Prevention Plan  Transportation Screening Complete  Medication Review (RN Care Manager) Complete  PCP or Specialist appointment within 3-5 days of discharge Complete  HRI or Home Care Consult Complete  Palliative Care Screening Not Applicable  Skilled Nursing Facility Not Applicable

## 2024-01-14 NOTE — Progress Notes (Signed)
  [facility] Progress Note   Subjective:  Last HD on 7/12 with 1.5 kg UF.   Seen and examined on dialysis.  Procedure supervised.  Blood pressure 111/52 and HR 80.  Left AVF in use.  States doesn't eat high K foods.  Cramping a bit.  Reduced goal   Two unmeasured urine voids over 7/13.       Objective Vitals:   01/14/24 1343 01/14/24 1410 01/14/24 1440 01/14/24 1515  BP: 115/61 129/63 123/63 116/89  Pulse: 77 77 73 87  Resp: 10 10 11 14   Temp:      TempSrc:      SpO2: 100% 100% 100% 100%  Weight:      Height:        Additional Objective Labs: Basic Metabolic Panel: Recent Labs  Lab 01/12/24 0331 01/13/24 0832 01/14/24 0646  NA 133* 132* 135  K 5.6* 3.8 5.8*  CL 94* 93* 96*  CO2 22 24 23   GLUCOSE 200* 96 128*  BUN 95* 61* 90*  CREATININE 5.14* 4.28* 7.35*  CALCIUM  8.9 8.6* 8.3*   CBC: Recent Labs  Lab 01/11/24 2114 01/12/24 0331 01/12/24 1424 01/13/24 0832 01/14/24 0646  WBC 8.0 8.0 11.7* 5.9 5.1  HGB 6.9* 5.7* 6.2* 7.9* 7.9*  HCT 21.0* 17.6* 18.2* 23.1* 23.7*  MCV 106.1* 107.3* 98.4 93.9 96.7  PLT 140* 97* 96* 94* 84*   Blood Culture    Component Value Date/Time   SDES FLUID PERICARDIAL 04/12/2023 1555   SPECREQUEST NONE 04/12/2023 1555   CULT  04/12/2023 1555    NO GROWTH 3 DAYS Performed at [facility], [address], [place], [place] [address]    REPTSTATUS 04/16/2023 FINAL 04/12/2023 1555    Physical Exam  General adult female in bed in no acute distress HEENT normocephalic atraumatic extraocular movements intact sclera anicteric Neck supple trachea midline Lungs clear to auscultation bilaterally normal work of breathing at rest  Heart S1S2 no rub Abdomen soft nontender nondistended Extremities no edema  Psych normal mood and affect Dialysis Access: LUE AVF in use  Medications:  anticoagulant sodium citrate       insulin  aspart  0-6 Units Subcutaneous Q4H   pantoprazole  (PROTONIX ) IV  40 mg Intravenous Q12H     Dialysis Orders:   MWF SW 3.5h   B350   69kg   2K bath  AVF  Heparin  none Last HD 7/11, post wt 70.8  Assessment/Plan: ABLA/Recurrent GIB. Hb 5.7 on admit. Transfused 2 u prbcs. GI consulted. Recent EGD on 7/8 with gastritis. EGD per GI ESRD. HD MWF. Continue on schedule HTN/volume. BP acceptable. UF to EDW as tolerated.  Hyperkalemia - HD today.  Needs renal diet on discharge Anemia. Last ESA dosed 7/7. Getting IV Fe load as OP.  PRBC's per primary team  MBD of CKD. Continue binders when taking PO.  Liver cirrhosis. S/p biopsy. 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 GI following   Disposition per primary team.  Per charting plan for discharge home after HD today  Katheryn JAYSON Saba, MD , 3:45 PM 01/14/2024

## 2024-01-14 NOTE — Progress Notes (Signed)
 D/C order noted. Contacted [facility] to be advised of pt's d/c today and that pt should resume care on Wednesday.   Olivia Canter Renal Navigator [phone]

## 2024-01-14 NOTE — Progress Notes (Signed)
 Received patient in bed to unit.  Alert and oriented.  Informed consent signed and in chart.   TX duration:3  Patient tolerated well.  Transported back to the room  Alert, without acute distress.  Hand-off given to patient's nurse.   Access used: AVF Access issues: n/a  Total UF removed: 1.3L Medication(s) given: Tylenol  Post HD weight: 70..6kg   01/14/24 1719  Vitals  Temp (!) 97.4 F (36.3 C)  Temp Source Oral  BP (!) 129/53  MAP (mmHg) 73  Pulse Rate 78  ECG Heart Rate 78  Resp 13  Oxygen Therapy  SpO2 100 %  O2 Device Room Air  During Treatment Monitoring  Blood Flow Rate (mL/min) 349 mL/min  Arterial Pressure (mmHg) -181.41 mmHg  Venous Pressure (mmHg) 196.76 mmHg  TMP (mmHg) 10.5 mmHg  Ultrafiltration Rate (mL/min) 0 mL/min  Dialysate Flow Rate (mL/min) 300 ml/min  Dialysate Potassium Concentration 3  Dialysate Calcium  Concentration 2.5  Duration of HD Treatment -hour(s) 3.49 hour(s)  Cumulative Fluid Removed (mL) per Treatment  1348.99  HD Safety Checks Performed Yes  Intra-Hemodialysis Comments Tx completed  Post Treatment  Dialyzer Clearance Clear  Hemodialysis Intake (mL) 60 mL  Liters Processed 73.5  Fluid Removed (mL) 1.3 mL  AVG/AVF Arterial Site Held (minutes) 5 minutes  AVG/AVF Venous Site Held (minutes) 5 minutes  Fistula / Graft Left Upper arm Arteriovenous fistula  Placement Date/Time: 12/23/21 0816   Orientation: Left  Access Location: (c) Upper arm  Access Type: Arteriovenous fistula  Site Condition No complications  Fistula / Graft Assessment Present;Thrill;Bruit  Status Deaccessed         [facility]

## 2024-01-14 NOTE — Plan of Care (Signed)
  Problem: Skin Integrity: Goal: Risk for impaired skin integrity will decrease Outcome: Progressing   Problem: Education: Goal: Knowledge of General Education information will improve Description: Including pain rating scale, medication(s)/side effects and non-pharmacologic comfort measures Outcome: Progressing   Problem: Health Behavior/Discharge Planning: Goal: Ability to manage health-related needs will improve Outcome: Progressing   Problem: Clinical Measurements: Goal: Ability to maintain clinical measurements within normal limits will improve Outcome: Progressing   Problem: Nutrition: Goal: Adequate nutrition will be maintained Outcome: Progressing

## 2024-01-14 NOTE — Discharge Planning (Signed)
 [facility] Dialysis Discharge Orders - [facility] CLINIC: [facility]  Patient's name: Stacey Chambers Admit/DC Dates: 01/11/2024 - 01/14/24  DISCHARGE DIAGNOSES: Acute GIB --> repeat EGD 7/13 showed 2 non-bleeding gastric ulcers (recent Bx sites) Symptomatic anemia --> transfused 2U PRBCs  HD ORDER CHANGES: Heparin  change: no EDW Change: no Bath Change: no  ANEMIA MANAGEMENT: Aranesp : Given: N Transfusion: Given: Y 2U RPBCs given 01/12/24 ESA dose for discharge: mircera 150 mcg IV q 2 weeks, to start on 7/22  BONE/MINERAL MEDICATIONS: Hectorol /Calcitriol change: no Sensipar/Parsabiv change: no  ACCESS INTERVENTION/CHANGE: no Details:   RECENT LABS: Recent Labs  Lab 01/12/24 0331 01/12/24 1424 01/14/24 0646  HGB 5.7*   < > 7.9*  NA 133*   < > 135  K 5.6*   < > 5.8*  CALCIUM  8.9   < > 8.3*  ALBUMIN  2.9*  --   --    < > = values in this interval not displayed.   IV ANTIBIOTICS: no Details:  OTHER ANTICOAGULATION: On Coumadin?: no  OTHER/APPTS/LAB ORDERS:   D/C Meds to be reconciled by nurse after every discharge.  Completed By: Izetta Boehringer, PA-C [facility] Pager [phone]   Reviewed by: MD:______ RN_______

## 2024-01-14 NOTE — Progress Notes (Signed)
 Discharge instructions given to pt via interpretor. Pt verbalized understanding of all teaching and had no further questions. Pt requested to eat dinner before leaving. States she doesn't have any food at home. Cab voucher also being sent up for discharge

## 2024-01-14 NOTE — Discharge Summary (Signed)
 Physician Discharge Summary  Stacey Chambers FMW:[id number] DOB: December 04, 1950 DOA: 01/11/2024  PCP: Sigrid Deidra Fox, MD  Admit date: 01/11/2024 Discharge date: 01/14/2024  Admitted From: Home Disposition:  Home  Discharge Condition:Stable CODE STATUS:FULL Diet recommendation: renal  Brief/Interim Summary: Patient is a 73 y.o. female with ESRD on hemodialysis, diabetes mellitus type 2, complete heart block status post pacemaker, cirrhosis of the liver who has had recent EGD and colonoscopy on January 08, 2024 when EGD showed gastric body oozing for which Purastat was injected and hemostasis was obtained.She presented to the ED again on 01/11/2024 with bloody stools and right knee pain that she describes as cramps.She had gotten hemodialysis prior to coming to the ED that day.  She was admitted to the hospital for acute GI bleeding complicated by acute blood loss anemia.  Hemoglobin was 6.9 on admission.  Received 2 units of blood transfusion during this hospitalization.  GI consulted.  Underwent EGD which showed nonbleeding gastric ulcer.  Hemoglobin is currently stable.  Plan for discharge today to home after dialysis.  She will be called by GI for outpatient follow-up.  Following problems were addressed during the hospitalization:    Acute  upper GI bleeding: Recent EGD and colonoscopy on January 08, 2023 and EGD showed gastric body oozing .  GI consulted.  Underwent EGD which showed nonbleeding gastric ulcer.  Status post application of clips.  She will follow-up with GI as an outpatient.  She takes PPI at home  Acute blood loss anemia: Hemoglobin up to 7.9.  S/p transfusion with 2 units of PRBCs. Check CBC in a week   ESRD: Dialyzed on Monday, Wednesday, Friday.  Nephrology following here for dialysis.  Plan for dialysis today before discharge.  She has mild hyperkalemia this morning which I expect to be resolved after dialysis  Hypertension: Patient takes amlodipine , losartan ,  metoprolol  at home.  Currently her blood pressure is soft.  Will continue to hold this medications on discharge.  I have recommended patient to follow-up with her PCP for resumption of these medications and to monitor blood pressure at home   She has history of complete heart block s/p permanent pacemaker.     Discharge Diagnoses:  Principal Problem:   GI bleeding Active Problems:   History of gastric ulcer   Cirrhosis (HCC)   DM2 (diabetes mellitus, type 2) (HCC)   End stage renal disease on dialysis (HCC)   CHB (complete heart block) (HCC)   S/P placement of leadless cardiac pacemaker   ABLA (acute blood loss anemia)   Melena    Discharge Instructions  Discharge Instructions     Diet - low sodium heart healthy   Complete by: As directed    Discharge instructions   Complete by: As directed    1)Please take your medications as instructed 2)You blood pressure has been on the lower side.We have discontinued the your blood pressure pills: Amlodipine , losartan  and metoprolol .  Please monitor blood pressure at home.  Follow-up with your PCP before resuming these medication.  Do a CBC test to check your hemoglobin when you follow-up with your PCP. 3)You will be called by gastroenterology for follow-up appointment   Increase activity slowly   Complete by: As directed       Allergies as of 01/14/2024       Reactions   Chlorhexidine  Rash   Fish Allergy Itching        Medication List     STOP taking these medications  amLODipine  10 MG tablet Commonly known as: NORVASC    losartan  100 MG tablet Commonly known as: COZAAR    metoprolol  succinate 25 MG 24 hr tablet Commonly known as: TOPROL -XL       TAKE these medications    acetaminophen  500 MG tablet Commonly known as: TYLENOL  Take 500 mg by mouth every 8 (eight) hours as needed for moderate pain.   albuterol  108 (90 Base) MCG/ACT inhaler Commonly known as: VENTOLIN  HFA Inhale 1-2 puffs into the lungs every 6  (six) hours as needed for wheezing or shortness of breath.   benzonatate  100 MG capsule Commonly known as: TESSALON  Take 100 mg by mouth 3 (three) times daily as needed.   DIALYVITE 800 WITH ZINC  0.8 MG Tabs Take 1 tablet by mouth at bedtime.   esomeprazole  40 MG capsule Commonly known as: NEXIUM  Take 1 capsule (40 mg total) by mouth 2 (two) times daily before a meal.   furosemide  80 MG tablet Commonly known as: LASIX  Take 80 mg by mouth daily.   ketorolac 0.5 % ophthalmic solution Commonly known as: ACULAR Place 1 drop into the right eye 4 (four) times daily.   lidocaine -prilocaine  cream Commonly known as: EMLA  Apply 1 Application topically.   Lokelma  10 g Pack packet Generic drug: sodium zirconium cyclosilicate  Take 10 g by mouth daily.   mirtazapine  15 MG tablet Commonly known as: REMERON  Take 15 mg by mouth See admin instructions. Take 1 tablet by mouth nightly on non-dialysis days   sevelamer  carbonate 800 MG tablet Commonly known as: RENVELA  Take 1 tablet (800 mg total) by mouth 3 (three) times daily with meals. What changed: how much to take   traMADol  50 MG tablet Commonly known as: ULTRAM  Take 50 mg by mouth daily as needed for moderate pain (pain score 4-6).   Velphoro 500 MG chewable tablet Generic drug: sucroferric oxyhydroxide Chew 500 mg by mouth 5 (five) times daily.   VISINE DRY EYE OP Apply 1 drop to eye 2 (two) times daily as needed (irritation).        Follow-up Information     Sigrid Kays, Sula, MD. Schedule an appointment as soon as possible for a visit in 1 week(s).   Specialty: Family Medicine Contact information: [address] [place] [address] [phone]                Allergies  Allergen Reactions   Chlorhexidine  Rash   Fish Allergy Itching    Consultations: GI, nephrology   Procedures/Studies: US  Abdomen Complete Result Date: 01/12/2024 CLINICAL DATA:  Abdominal pain.  History of  cirrhosis. EXAM: ABDOMEN ULTRASOUND COMPLETE COMPARISON:  Multiple prior imaging studies. The most recent ultrasound examination is 12/01/2021 FINDINGS: Gallbladder: No gallstones or wall thickening visualized. No sonographic Murphy sign noted by sonographer. Common bile duct: Diameter: 3.4 mm Liver: Cirrhotic changes involving the liver with irregular liver contour and increased and coarse echogenicity. No focal hepatic lesions are identified. Portal vein is patent on color Doppler imaging with normal direction of blood flow towards the liver. IVC: Normal caliber. Pancreas: Visualized portion unremarkable. Spleen: Within normal limits in size. Right Kidney: Length: 5.8 cm. Renal cortical atrophy and increased echogenicity suggesting medical renal disease. No hydronephrosis. Left Kidney: Length: 8.4 cm. Renal cortical atrophy and increased echogenicity suggesting medical renal disease. No hydronephrosis. Simple 3 cm renal cyst. Abdominal aorta: Normal caliber. Limited visualization due to overlying bowel gas. Other findings: No ascites. IMPRESSION: 1. Cirrhotic changes involving the liver but no focal hepatic  lesions. 2. No ascites. 3. No gallstones findings for acute cholecystitis. 4. Bilateral renal cortical atrophy and increased echogenicity suggesting medical renal disease. 5. No ascites. Electronically Signed   By: MYRTIS Stammer M.D.   On: 01/12/2024 12:12      Subjective: Patient seen and examined at bedside today.  Hemodynamically stable.  Hemoglobin is better in the range of 7 today.  She denies any hematochezia or melena or abdominal pain.  She feels ready to go home.  Discharge Exam: Vitals:   01/14/24 0400 01/14/24 0712  BP: 138/76 (!) 116/58  Pulse: 75 72  Resp:  16  Temp: 98.2 F (36.8 C) 98.6 F (37 C)  SpO2: 99% 100%   Vitals:   01/13/24 1605 01/13/24 1955 01/14/24 0400 01/14/24 0712  BP: 131/60 135/70 138/76 (!) 116/58  Pulse: 77 77 75 72  Resp: 17 16  16   Temp: 98.3 F (36.8  C) 98.2 F (36.8 C) 98.2 F (36.8 C) 98.6 F (37 C)  TempSrc: Oral Oral Oral Oral  SpO2: 100% 100% 99% 100%  Weight:      Height:        General: Pt is alert, awake, not in acute distress Cardiovascular: RRR, S1/S2 +, no rubs, no gallops Respiratory: CTA bilaterally, no wheezing, no rhonchi Abdominal: Soft, NT, ND, bowel sounds + Extremities: no edema, no cyanosis    The results of significant diagnostics from this hospitalization (including imaging, microbiology, ancillary and laboratory) are listed below for reference.     Microbiology: No results found for this or any previous visit (from the past 240 hours).   Labs: BNP (last 3 results) Recent Labs    04/22/23 2057  BNP 2,066.2*   Basic Metabolic Panel: Recent Labs  Lab 01/08/24 0919 01/11/24 2114 01/12/24 0331 01/13/24 0832 01/14/24 0646  NA 139 133* 133* 132* 135  K 4.7 5.2* 5.6* 3.8 5.8*  CL 101 92* 94* 93* 96*  CO2  --  22 22 24 23   GLUCOSE 113* 358* 200* 96 128*  BUN 31* 74* 95* 61* 90*  CREATININE 6.80* 4.76* 5.14* 4.28* 7.35*  CALCIUM   --  9.1 8.9 8.6* 8.3*   Liver Function Tests: Recent Labs  Lab 01/11/24 2114 01/12/24 0331  AST 32 26  ALT 19 17  ALKPHOS 84 63  BILITOT 0.9 0.7  PROT 7.4 6.7  ALBUMIN  3.2* 2.9*   Recent Labs  Lab 01/11/24 2114  LIPASE 43   No results for input(s): AMMONIA in the last 168 hours. CBC: Recent Labs  Lab 01/11/24 2114 01/12/24 0331 01/12/24 1424 01/13/24 0832 01/14/24 0646  WBC 8.0 8.0 11.7* 5.9 5.1  HGB 6.9* 5.7* 6.2* 7.9* 7.9*  HCT 21.0* 17.6* 18.2* 23.1* 23.7*  MCV 106.1* 107.3* 98.4 93.9 96.7  PLT 140* 97* 96* 94* 84*   Cardiac Enzymes: No results for input(s): CKTOTAL, CKMB, CKMBINDEX, TROPONINI in the last 168 hours. BNP: Invalid input(s): POCBNP CBG: Recent Labs  Lab 01/13/24 1604 01/13/24 1953 01/14/24 0010 01/14/24 0424 01/14/24 0746  GLUCAP 116* 142* 120* 123* 118*   D-Dimer No results for input(s): DDIMER in  the last 72 hours. Hgb A1c No results for input(s): HGBA1C in the last 72 hours. Lipid Profile No results for input(s): CHOL, HDL, LDLCALC, TRIG, CHOLHDL, LDLDIRECT in the last 72 hours. Thyroid  function studies No results for input(s): TSH, T4TOTAL, T3FREE, THYROIDAB in the last 72 hours.  Invalid input(s): FREET3 Anemia work up No results for input(s): VITAMINB12, FOLATE, FERRITIN, TIBC, IRON , RETICCTPCT  in the last 72 hours. Urinalysis    Component Value Date/Time   COLORURINE STRAW (A) 09/16/2022 0730   APPEARANCEUR CLEAR 09/16/2022 0730   LABSPEC 1.006 09/16/2022 0730   PHURINE 9.0 (H) 09/16/2022 0730   GLUCOSEU 50 (A) 09/16/2022 0730   HGBUR MODERATE (A) 09/16/2022 0730   BILIRUBINUR NEGATIVE 09/16/2022 0730   KETONESUR NEGATIVE 09/16/2022 0730   PROTEINUR 100 (A) 09/16/2022 0730   NITRITE NEGATIVE 09/16/2022 0730   LEUKOCYTESUR NEGATIVE 09/16/2022 0730   Sepsis Labs Recent Labs  Lab 01/12/24 0331 01/12/24 1424 01/13/24 0832 01/14/24 0646  WBC 8.0 11.7* 5.9 5.1   Microbiology No results found for this or any previous visit (from the past 240 hours).  Please note: You were cared for by a hospitalist during your hospital stay. Once you are discharged, your primary care physician will handle any further medical issues. Please note that NO REFILLS for any discharge medications will be authorized once you are discharged, as it is imperative that you return to your primary care physician (or establish a relationship with a primary care physician if you do not have one) for your post hospital discharge needs so that they can reassess your need for medications and monitor your lab values.    Time coordinating discharge: 40 minutes  SIGNED:   Ivonne Mustache, MD  [facility] 01/14/2024, 10:45 AM Pager [phone]  If 7PM-7AM, please contact night-coverage [email or web address] Password [email or web address]

## 2024-01-14 NOTE — Progress Notes (Signed)
 Pt IV removed, transportation called, pt made aware of time frame, all questions answered

## 2024-01-14 NOTE — Progress Notes (Signed)
 Pt arrived back to 6 north room 32 alert and oriented x4. Bed in lowest position. Call light in reach.

## 2024-01-15 ENCOUNTER — Ambulatory Visit (HOSPITAL_COMMUNITY)
Admission: RE | Admit: 2024-01-15 | Discharge: 2024-01-15 | Disposition: A | Discharge status: Home / Self Care | Source: Ambulatory Visit | Attending: Gastroenterology | Admitting: Gastroenterology

## 2024-01-15 ENCOUNTER — Telehealth (HOSPITAL_COMMUNITY): Payer: Self-pay | Admitting: Nephrology

## 2024-01-15 DIAGNOSIS — K921 Melena: Secondary | ICD-10-CM | POA: Diagnosis present

## 2024-01-15 DIAGNOSIS — K7030 Alcoholic cirrhosis of liver without ascites: Secondary | ICD-10-CM | POA: Diagnosis present

## 2024-01-15 DIAGNOSIS — Z8711 Personal history of peptic ulcer disease: Secondary | ICD-10-CM | POA: Insufficient documentation

## 2024-01-15 DIAGNOSIS — D638 Anemia in other chronic diseases classified elsewhere: Secondary | ICD-10-CM | POA: Insufficient documentation

## 2024-01-15 LAB — TYPE AND SCREEN
ABO/RH(D): O POS
Antibody Screen: POSITIVE
DAT, IgG: NEGATIVE
Unit division: 0
Unit division: 0
Unit division: 0
Unit division: 0

## 2024-01-15 LAB — BPAM RBC
Blood Product Expiration Date: 202508102359
Blood Product Expiration Date: 202508102359
Blood Product Expiration Date: 202508102359
Blood Product Expiration Date: 202508102359
ISSUE DATE / TIME: 202507120519
ISSUE DATE / TIME: 202507121534
Unit Type and Rh: 5100
Unit Type and Rh: 5100
Unit Type and Rh: 5100
Unit Type and Rh: 5100

## 2024-01-15 LAB — HEPATITIS B SURFACE ANTIBODY, QUANTITATIVE: Hep B S AB Quant (Post): 90.6 m[IU]/mL

## 2024-01-15 NOTE — Telephone Encounter (Signed)
 Transition of Care - Initial Contact after Hospitalization  Date of discharge: 01/14/24 Date of contact: 01/15/24  Method: Phone Spoke to: Patient  Patient contacted to discuss transition of care from recent inpatient hospitalization. Patient was admitted to [facility] from 7/11 - 01/14/24 with Acute GIB and symptomatic anemia.  The discharge medication list was reviewed. Patient understands the changes and has no concerns.   Patient will return to his/her outpatient HD unit on: tomorrow.  No other concerns at this time. Energy is still low, but better than prior.  Izetta Boehringer, PA-C [employer] Pager [phone]

## 2024-01-16 ENCOUNTER — Telehealth: Payer: Self-pay

## 2024-01-16 DIAGNOSIS — D638 Anemia in other chronic diseases classified elsewhere: Secondary | ICD-10-CM

## 2024-01-16 DIAGNOSIS — Z862 Personal history of diseases of the blood and blood-forming organs and certain disorders involving the immune mechanism: Secondary | ICD-10-CM

## 2024-01-16 DIAGNOSIS — Z8711 Personal history of peptic ulcer disease: Secondary | ICD-10-CM

## 2024-01-16 LAB — HEMOGLOBIN A1C
Hgb A1c MFr Bld: 5.1 % (ref 4.8–5.6)
Mean Plasma Glucose: 100 mg/dL

## 2024-01-16 NOTE — Telephone Encounter (Signed)
 Call made to the patient using Interpreter services line.  Spoke with Stacey Chambers (ID [phone]) and she notified that patient that she will need to have lab work in 1 week per Dr San.  Patient stated she still continues to have dark stools since being discharged from the hospital. I made the patient aware that I will notify Dr San to see if he has any further recommendations.

## 2024-01-16 NOTE — Telephone Encounter (Signed)
-----   Message from Sandor LULLA Flatter sent at 01/14/2024  9:27 AM EDT ----- Regarding: RE: Hospital follow-up Thanks for taking care of her over the weekend!  There was gastric biopsy sites were tough.  Persistent oozing, but I was a little concerned that the mucosa would not hold clips, which is why I elected for PuraStat gel instead.  Glad you were able to locate and control source of bleeding.  Nat, can you please order for repeat CBC in 7 days to ensure returning to baseline.  Please order under my name.  Can call her midweek after she is discharged to make sure she is doing okay.  Thank you again, Victory!  VC ----- Message ----- From: Legrand Victory LITTIE DOUGLAS, MD Sent: 01/13/2024   1:26 PM EDT To: Sandor Flatter LULLA, DO Subject: Hospital follow-up                             Vito,  This patient of yours with cirrhosis had EGD colonoscopy with you on 01/08/2024.  She had some bleeding from one of the gastric biopsy sites treated with peers that. She got admitted over the weekend with melena that had stopped the day before but she had a significant hemoglobin drop from recent baseline. EGD Sunday with 2 biopsy related small shallow gastric ulcers, put clips on both.  No active bleeding, no old blood in the upper GI tract.  Seemed unlikely that the diminutive cecal polyp hot snare site would have caused this.  She will likely go home this weekend, and she should get a repeat CBC soon.  HD

## 2024-01-16 NOTE — Telephone Encounter (Signed)
 Pedro, [facility], made patient aware that she needs to have CBC drawn tomorrow.  Patient agreed to plan and verbalized understanding.  No further questions.

## 2024-01-17 ENCOUNTER — Other Ambulatory Visit: Payer: Self-pay

## 2024-01-17 ENCOUNTER — Other Ambulatory Visit (INDEPENDENT_AMBULATORY_CARE_PROVIDER_SITE_OTHER)

## 2024-01-17 ENCOUNTER — Ambulatory Visit: Payer: Self-pay | Admitting: Gastroenterology

## 2024-01-17 DIAGNOSIS — Z862 Personal history of diseases of the blood and blood-forming organs and certain disorders involving the immune mechanism: Secondary | ICD-10-CM

## 2024-01-17 DIAGNOSIS — D638 Anemia in other chronic diseases classified elsewhere: Secondary | ICD-10-CM

## 2024-01-17 DIAGNOSIS — Z8711 Personal history of peptic ulcer disease: Secondary | ICD-10-CM

## 2024-01-17 LAB — CBC
HCT: 26.8 % — ABNORMAL LOW (ref 36.0–46.0)
Hemoglobin: 9 g/dL — ABNORMAL LOW (ref 12.0–15.0)
MCHC: 33.7 g/dL (ref 30.0–36.0)
MCV: 97.6 fl (ref 78.0–100.0)
Platelets: 88 K/uL — ABNORMAL LOW (ref 150.0–400.0)
RBC: 2.74 Mil/uL — ABNORMAL LOW (ref 3.87–5.11)
RDW: 23.5 % — ABNORMAL HIGH (ref 11.5–15.5)
WBC: 4.2 K/uL (ref 4.0–10.5)

## 2024-01-30 NOTE — Progress Notes (Signed)
 Cardiology Office Note:    Date:  02/12/2024   ID:  Stacey, Chambers [birth year], MRN [id number]  PCP:  Sigrid Kays, Sula, MD  Cardiologist:  Dub Huntsman, DO Electrophysiologist:  OLE ONEIDA HOLTS, MD     Referring MD: Sigrid Kays, Sula, *   Chief Complaint: follow-up of CHF, atrial fibrillation, and complete heart block  History of Present Illness:    Stacey Chambers is a 73 y.o. 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  Patient was admitted in 10/2022 for seizure like activity that occurred during dialysis. En route to the ED, she reportedly had about 20 seconds of asystole but chest compressions were not started. Heart rate returned but she was bradycardia and required Atropine . EKG in the ED showed complete heart block with ventricular rate of 33 bpm. This was in the setting of hyperkalemia with potassium of 6.3. Complete heart block resolved with dialysis and no pacemaker was required at that time. She was readmitted in 01/2023 for complete heart block after presenting with shortness of breath and near syncope. She underwent placement of a temporary pacemaker and then ultimately had a Medtronic Micra leadless pacemaker placed.   She was admitted in 04/2023 with new onset atrial fibrillation. Echo showed LVEF of 60-65% with normal wall motion and grade 1 diastolic dysfunction, normal RV function, mild MR, and a large pericardial effusion with signs of cardiac tamponade. She underwent pericardiocentesis with removal of 400cc of dark bloody fluid. She converted back to sinus rhythm  on her own. Given short lived episode of atrial fibrillation in setting of cardiac tamponade as well as bloody pericardial effusion and anemia, decision was made to hold off on starting anticoagulation. She had multiple limited Echo prior to discharge which showed stable small to moderate pericardial effusion. She was readmitted 4 days after discharge for community acquired pneumonia.   Repeat Echo in 07/2023 showed LVEF of 65-70% with mild MR, severe TR, trivial pericardial effusion, and a large left pleural effusion.   She was last seen by me in 10/2023 at which time she was doing well from a cardiac standpoint with no chest pain or shortness of breath. However, she did report intermittent numbness in her left arm as well as transient vision loss during dialysis the week prior concerning for possible TIA. She did not have any current neurologic symptoms. We discussed ordering head imaging but she was scheduled to see her PCP the following week and was going to discuss this with them.    She was recently admitted from 01/11/2024 to 01/14/2024 for acute GI bleed with hemoglobin of 6.9. She was transfused 2 units of PRBCs. EGD showed a non-bleeding gastric ulcer.   Patient presents today for follow-up. Her main complaint today is diffuse muscles cramps that she gets multiple times a week, mostly during dialysis but also outside these times.  She had some diffuse cramping last night. She is also having a lot of low back pain today. However, she sounds stable from a cardiac standpoint.   She reports some fluid retention in her abdomen but denies any other signs of volume overload.  No lower extremity edema.  She is compliant with dialysis  and states she has not had any issues with this lately.  Her BP is mildly elevated today with systolic BP in the 150s.  She was previously on Amlodipine  but this was stopped during recent admission last month due to soft BP.  It sounds like her BP is still soft during dialysis.   She denies any recurrent abnormal bleeding since recent admission.  No hematochezia or melena.  ROS: No chest pain, shortness of breath, orthopnea, PND, lower extremity edema, palpitations, lightheadedness, dizziness, syncope.   EKGs/Labs/Other Studies Reviewed:    The following studies were reviewed:  Limited Echocardiogram 07/24/2023: Impressions: 1. Left ventricular ejection fraction, by estimation, is 65 to 70%. The  left ventricle has normal function. The left ventricle has no regional  wall motion abnormalities. Indeterminate diastolic filling due to E-A  fusion.   2. Large pleural effusion in the right lateral region.   3. Mild mitral valve regurgitation.   4. Tricuspid valve regurgitation is severe.   5. The aortic valve has an indeterminant number of cusps. There is  moderate calcification of the aortic valve. Aortic valve regurgitation is  not visualized. Aortic valve sclerosis is present, with no evidence of  aortic valve stenosis.   6. There is mildly elevated pulmonary artery systolic pressure.   7. The inferior vena cava is normal in size with <50% respiratory  variability, suggesting right atrial pressure of 8 mmHg.  EKG:  EKG not ordered today.   Recent Labs: 04/22/2023: B Natriuretic Peptide 2,066.2 04/23/2023: TSH 6.326 04/24/2023: Magnesium  2.6 01/12/2024: ALT 17 01/14/2024: BUN 90; Creatinine, Ser 7.35; Potassium 5.8; Sodium 135 01/17/2024: Hemoglobin 9.0; Platelets 88.0  Recent Lipid Panel    Component Value Date/Time   CHOL 144 03/30/2022 1327   TRIG 137 10/27/2022 0700   HDL 61.60 03/30/2022 1327   CHOLHDL 2 03/30/2022 1327   VLDL 21.2 03/30/2022 1327   LDLCALC 62 03/30/2022 1327    Physical Exam:    Vital Signs: BP (!) 150/76   Pulse 81   Ht 4' 10 (1.473 m)   Wt 156 lb (70.8 kg)   SpO2 99%   BMI 32.60 kg/m     Wt Readings from Last 3 Encounters:  02/12/24 156 lb (70.8 kg)  01/14/24 155 lb 10.3 oz (70.6 kg)  01/08/24 156 lb 8.4 oz (71 kg)      General: 73 y.o. female in no acute distress. HEENT: Normocephalic and atraumatic. Sclera clear.  Neck: Supple. No carotid bruits. No JVD. Heart:  RRR. II/VI systolic murmur.  Lungs: No increased work of breathing. Clear to ausculation bilaterally. No wheezes, rhonchi, or rales.  Extremities: No lower extremity edema. Skin: Warm and dry. Neuro: No focal deficits. Psych: Normal affect. Responds appropriately.  Assessment:    1. Chronic heart failure with preserved ejection fraction (HFpEF) (HCC)   2. History of cardiac tamponade   3. Paroxysmal atrial fibrillation (HCC)   4. Heart block AV complete (HCC)   5. Mitral valve insufficiency, unspecified etiology   6. Tricuspid valve insufficiency, unspecified etiology   7. Primary hypertension   8. ESRD (end stage renal disease) (HCC)   9. Hyperkalemia   10. History of transient vision loss   11. Muscle cramps     Plan:    Chronic HFpEF Last Echo in 07/2023 showed LVEF of 65-70% with mild MR, severe TR, trivial pericardial effusion, and a large left pleural effusion. - Euvolemic on exam.  - Volume status is managed via dialysis.  - She also  is on Lasix  80mg  daily but tells me she does not make urine. Advised her to discuss this with Nephrology as it seems like this should be stopped.    History of Pericardial Effusion with Cardiac Tamponade Patient was admitted in 04/2023 and found to have a large pericardial effusion with evidence of cardiac tamponade. S/p pericardiocentesis at that time. Most recent Echo in 07/2023 showed only trivial pericardial effusion.    Paroxysmal Atrial Fibrillation Found to be in atrial fibrillation in 04/2023 during admission for cardiac tamponade. She converted to sinus rhythm on her own. Given short lived episode of atrial fibrillation in setting of cardiac tamponade as well as bloody pericardial effusion and anemia, decision was made to hold off on starting anticoagulation.  - Maintaining sinus  rhythm on exam.  - No palpitations. - Not on any AV nodal agents. - Not on anticoagulation as above.   Complete Heart Block s/p Leadless PPM S/p Medtronic Micra leadless PPM in 01/2023.  - Followed by Dr. Cindie.    Mild Mitral Regurgitation Severe Tricuspid Regurgitation Recent Echo in 07/2023 showed normal LV function with mild MR and severe TR. No mention of RV dysfunction on report. - No signs of volume overload.  - Will continue routine monitoring with serial Echos per guidelines.     Hypertension BP mildly elevated with systolic BP in the 150s. Confirmed on my recheck. She is having a lot of lower back pain today which may be contributing to higher readings.  - Previously on Amlodipine  5mg  daily but this was stopped during recent admission in 01/2024 due to low BP. It sounds like she is still having some low BP readings on dialysis days. - Asked patient to keep a BP/HR log for 1-2 weeks and then send this to me. She may need Amlodipine  on non-dialysis days.   ESRD  Hyperkalemia On dialysis on M/W/F and daily Lokelma . - Management per Nephrology.  History of Transient Vision Loss  At last visit in 10/2023, she reported an episode of complete vision loss that lasted for about 20-25 minutes during a recent dialysis session. It was unclear whether this was in the setting of hypotension. We discussed that transient vision loss could be a TIA. However, she states she spoke with her PCP about this who felt it was due to an extra dialysis session. - She denies any recurrence. - She does have a history of atrial fibrillation and is not on anticoagulation as above. Discussed with EP and we are not able to assess for atrial fibrillation on the type of PPM that she has. Therefore, will order a 2 week Zio monitor to assess for recurrent atrial fibrillation.  Muscle Cramps Patient main complaint today is intermittent diffuse muscle cramps (often during dialysis). - Offered to check electrolytes  with CMET and Magnesium  but patient declined.  Disposition: Follow up in 5 months. She previously saw Dr. Ronal Ross so will have her get re-established with Dr. Sheena.   Signed, Aline FORBES Door, PA-C  02/12/2024 10:19 AM    [facility]

## 2024-02-01 ENCOUNTER — Encounter

## 2024-02-12 ENCOUNTER — Ambulatory Visit

## 2024-02-12 ENCOUNTER — Ambulatory Visit: Attending: Student | Admitting: Student

## 2024-02-12 ENCOUNTER — Encounter: Payer: Self-pay | Admitting: Student

## 2024-02-12 VITALS — BP 150/76 | HR 81 | Ht <= 58 in | Wt 156.0 lb

## 2024-02-12 DIAGNOSIS — I071 Rheumatic tricuspid insufficiency: Secondary | ICD-10-CM

## 2024-02-12 DIAGNOSIS — I442 Atrioventricular block, complete: Secondary | ICD-10-CM | POA: Diagnosis not present

## 2024-02-12 DIAGNOSIS — H547 Unspecified visual loss: Secondary | ICD-10-CM

## 2024-02-12 DIAGNOSIS — I340 Nonrheumatic mitral (valve) insufficiency: Secondary | ICD-10-CM

## 2024-02-12 DIAGNOSIS — I5032 Chronic diastolic (congestive) heart failure: Secondary | ICD-10-CM | POA: Diagnosis not present

## 2024-02-12 DIAGNOSIS — I314 Cardiac tamponade: Secondary | ICD-10-CM

## 2024-02-12 DIAGNOSIS — N186 End stage renal disease: Secondary | ICD-10-CM

## 2024-02-12 DIAGNOSIS — I480 Paroxysmal atrial fibrillation: Secondary | ICD-10-CM

## 2024-02-12 DIAGNOSIS — R252 Cramp and spasm: Secondary | ICD-10-CM

## 2024-02-12 DIAGNOSIS — I10 Essential (primary) hypertension: Secondary | ICD-10-CM

## 2024-02-12 DIAGNOSIS — E875 Hyperkalemia: Secondary | ICD-10-CM

## 2024-02-12 NOTE — Patient Instructions (Addendum)
 Medication Instructions:  Your physician recommends that you continue on your current medications as directed. Please refer to the Current Medication list given to you today.  *If you need a refill on your cardiac medications before your next appointment, please call your pharmacy*  Lab Work: None ordered If you have labs (blood work) drawn today and your tests are completely normal, you will receive your results only by: MyChart Message (if you have MyChart) OR A paper copy in the mail If you have any lab test that is abnormal or we need to change your treatment, we will call you to review the results.  Testing/Procedures: Stacey Chambers- Long Term Monitor Instructions  Your physician has requested you wear a ZIO patch monitor for 14 days.  This is a single patch monitor. [employer] supplies one patch monitor per enrollment. Additional stickers are not available. Please do not apply patch if you will be having a Nuclear Stress Test,  Echocardiogram, Cardiac CT, MRI, or Chest Xray during the period you would be wearing the  monitor. The patch cannot be worn during these tests. You cannot remove and re-apply the  ZIO XT patch monitor.  Your ZIO patch monitor will be mailed 3 day USPS to your address on file. It may take 3-5 days  to receive your monitor after you have been enrolled.  Once you have received your monitor, please review the enclosed instructions. Your monitor  has already been registered assigning a specific monitor serial # to you.  Billing and Patient Assistance Program Information  We have supplied [employer] with any of your insurance information on file for billing purposes. [employer] offers a sliding scale Patient Assistance Program for patients that do not have  insurance, or whose insurance does not completely cover the cost of the ZIO monitor.  You must apply for the Patient Assistance Program to qualify for this discounted rate.  To apply, please call [employer] at [phone],  select option 4, select option 2, ask to apply for  Patient Assistance Program. [name] will ask your household income, and how many people  are in your household. They will quote your out-of-pocket cost based on that information.  [employer] will also be able to set up a 15-month, interest-free payment plan if needed.  Applying the monitor   Shave hair from upper left chest.  Hold abrader disc by orange tab. Rub abrader in 40 strokes over the upper left chest as  indicated in your monitor instructions.  Clean area with 4 enclosed alcohol pads. Let dry.  Apply patch as indicated in monitor instructions. Patch will be placed under collarbone on left  side of chest with arrow pointing upward.  Rub patch adhesive wings for 2 minutes. Remove white label marked 1. Remove the white  label marked 2. Rub patch adhesive wings for 2 additional minutes.  While looking in a mirror, press and release button in center of patch. A small green light will  flash 3-4 times. This will be your only indicator that the monitor has been turned on.  Do not shower for the first 24 hours. You may shower after the first 24 hours.  Press the button if you feel a symptom. You will hear a small click. Record Date, Time and  Symptom in the Patient Logbook.  When you are ready to remove the patch, follow instructions on the last 2 pages of Patient  Logbook. Stick patch monitor onto the last page of Patient Logbook.  Place Patient Logbook in the  blue and white box. Use locking tab on box and tape box closed  securely. The blue and white box has prepaid postage on it. Please place it in the mailbox as  soon as possible. Your physician should have your test results approximately 7 days after the  monitor has been mailed back to [facility].  Call [facility] Customer Care at [phone] if you have questions regarding  your ZIO XT patch monitor. Call them immediately if you see an orange light blinking on your   monitor.  If your monitor falls off in less than 4 days, contact our Monitor department at [phone].  If your monitor becomes loose or falls off after 4 days call [employer] at [phone] for  suggestions on securing your monitor   Follow-Up: At [facility], you and your health needs are our priority.  As part of our continuing mission to provide you with exceptional heart care, our providers are all part of one team.  This team includes your primary Cardiologist (physician) and Advanced Practice Providers or APPs (Physician Assistants and Nurse Practitioners) who all work together to provide you with the care you need, when you need it.  Your next appointment:   5 month(s)  Provider:   Kardie Tobb, DO    We recommend signing up for the patient portal called MyChart.  Sign up information is provided on this After Visit Summary.  MyChart is used to connect with patients for Virtual Visits (Telemedicine).  Patients are able to view lab/test results, encounter notes, upcoming appointments, etc.  Non-urgent messages can be sent to your provider as well.   To learn more about what you can do with MyChart, go to [email or web address].   Other Instructions     PLEASE DISCUSS WITH YOUR KIDNEY DR IF YOU SHOULD BE TAKING LASIX .   Your physician has requested that you regularly monitor and record your blood pressure readings at home. Please use the same machine at the same time of day to check your readings and record them to bring to your follow-up visit.   Please monitor blood pressures and heart rate and keep a log of your readings for 2 weeks and then call them into the office.     Make sure to check 2 hours after your medications.    AVOID these things for 30 minutes before checking your blood pressure: No Drinking caffeine. No Drinking alcohol. No Eating. No Smoking. No Exercising.   Five minutes before checking your blood pressure: Pee. Sit in a dining chair.  Avoid sitting in a soft couch or armchair. Be quiet. Do not talk

## 2024-02-12 NOTE — Progress Notes (Unsigned)
 Enrolled for [employer] to mail a ZIO XT long term holter monitor to the patients address on file.   [name] instructions requested.  Dr. Sheena to read.

## 2024-03-12 ENCOUNTER — Ambulatory Visit: Admitting: Gastroenterology

## 2024-03-12 ENCOUNTER — Other Ambulatory Visit (INDEPENDENT_AMBULATORY_CARE_PROVIDER_SITE_OTHER)

## 2024-03-12 ENCOUNTER — Encounter: Payer: Self-pay | Admitting: Gastroenterology

## 2024-03-12 VITALS — BP 144/72 | HR 82 | Ht <= 58 in | Wt 156.4 lb

## 2024-03-12 DIAGNOSIS — K7460 Unspecified cirrhosis of liver: Secondary | ICD-10-CM

## 2024-03-12 DIAGNOSIS — Z8711 Personal history of peptic ulcer disease: Secondary | ICD-10-CM

## 2024-03-12 DIAGNOSIS — D509 Iron deficiency anemia, unspecified: Secondary | ICD-10-CM | POA: Diagnosis not present

## 2024-03-12 DIAGNOSIS — K7469 Other cirrhosis of liver: Secondary | ICD-10-CM

## 2024-03-12 DIAGNOSIS — Z860101 Personal history of adenomatous and serrated colon polyps: Secondary | ICD-10-CM

## 2024-03-12 DIAGNOSIS — D500 Iron deficiency anemia secondary to blood loss (chronic): Secondary | ICD-10-CM | POA: Diagnosis not present

## 2024-03-12 DIAGNOSIS — D649 Anemia, unspecified: Secondary | ICD-10-CM

## 2024-03-12 LAB — IBC + FERRITIN
Ferritin: 632.7 ng/mL — ABNORMAL HIGH (ref 10.0–291.0)
Iron: 88 ug/dL (ref 42–145)
Saturation Ratios: 33.1 % (ref 20.0–50.0)
TIBC: 266 ug/dL (ref 250.0–450.0)
Transferrin: 190 mg/dL — ABNORMAL LOW (ref 212.0–360.0)

## 2024-03-12 LAB — CBC
HCT: 35.4 % — ABNORMAL LOW (ref 36.0–46.0)
Hemoglobin: 12 g/dL (ref 12.0–15.0)
MCHC: 33.8 g/dL (ref 30.0–36.0)
MCV: 98.5 fl (ref 78.0–100.0)
Platelets: 71 K/uL — ABNORMAL LOW (ref 150.0–400.0)
RBC: 3.59 Mil/uL — ABNORMAL LOW (ref 3.87–5.11)
RDW: 16.5 % — ABNORMAL HIGH (ref 11.5–15.5)
WBC: 4.6 K/uL (ref 4.0–10.5)

## 2024-03-12 LAB — VITAMIN B12: Vitamin B-12: 936 pg/mL — ABNORMAL HIGH (ref 211–911)

## 2024-03-12 LAB — FOLATE: Folate: >22.9 ng/mL (ref >5.9)

## 2024-03-12 NOTE — Progress Notes (Unsigned)
 GASTROENTEROLOGY OUTPATIENT CLINIC VISIT   Primary Care Provider Sigrid Kays, [place], MD [address] [place] [address] [phone]  Patient Profile: Stacey Chambers is a 73 y.o. 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  The patient presents to the [facility] for an evaluation and management of problem(s) noted below:  Problem List No diagnosis found.   History of Present Illness Please see prior note for full details of HPI.  Interval History Today, the patient returns for a scheduled follow-up.  Interpreter services are available with her today.  The patient states that she is very happy and doing extremely well.  Since her last visit she underwent an upper endoscopy with results as below showing evidence of a persisting gastric ulcer.  PPI therapy was initiated and she has been doing relatively well.  She did undergo an abdominal ultrasound with findings as below that showed some concerning findings for potential fatty liver but also a splenic lesion.  Our team had tried to reach out to her but was unsuccessful with wanting to discuss with her potential cross-sectional imaging of her abdomen.  Patient has no significant symptoms at this time.  Pyrosis symptoms are also improved.  Her previous dyspepsia symptoms are also improved.  She is not drinking any alcohol at this time.  Patient is not using significant nonsteroidals or BC/Goody powders.  She has been experiencing some other issues that she is considering potential reevaluation by a new primary care provider.  Patient also is having issues with transportation and is concerned about the repeat endoscopy that we had discussed and how she would  be able to come to that.  We had also found that the patient had evidence of iron  deficiency but because our team had not been able to reach her she has not initiated any oral iron  therapy.  The patient describes no alteration of her bowel habits at this time and she is not noticing any blood in her stools (melena/maroon/hematochezia).  GI Review of Systems Positive as above Negative for odynophagia, dysphagia, nausea, vomiting, alteration of bowel habits   Review of Systems General: Denies fevers/chills/weight loss unintentionally Cardiovascular: Denies chest pain Pulmonary: Denies shortness of breath Gastroenterological: See HPI Genitourinary: Denies darkened urine or hematuria Hematological: Denies easy bruising/bleeding Dermatological: Denies jaundice Psychological: Mood is stable   Medications Current Outpatient Medications  Medication Sig Dispense Refill   acetaminophen  (TYLENOL ) 500 MG tablet Take 500 mg by mouth every 8 (eight) hours as needed for moderate pain.     lidocaine -prilocaine  (EMLA ) cream Apply 1 Application topically.     LOKELMA  10 g PACK packet Take 10 g by mouth daily.     sevelamer  carbonate (RENVELA ) 800 MG tablet Take 1 tablet (800 mg total) by mouth 3 (three) times daily with meals. 90 tablet 1   albuterol  (VENTOLIN  HFA) 108 (90 Base) MCG/ACT inhaler Inhale 1-2 puffs into the lungs every 6 (six) hours as needed for wheezing or shortness of breath. (Patient not taking: Reported on 03/12/2024)     B Complex-C-Zn-Folic Acid  (DIALYVITE 800 WITH ZINC ) 0.8 MG TABS Take 1 tablet by mouth at bedtime. (Patient not taking: Reported on 03/12/2024)     benzonatate  (TESSALON ) 100 MG capsule Take 100 mg by mouth 3 (three) times daily as needed. (Patient not  taking: Reported on 03/12/2024)     esomeprazole  (NEXIUM ) 40 MG capsule Take 1 capsule (40 mg total) by mouth 2 (two) times daily before a meal. (Patient not taking: Reported on 03/12/2024) 60 capsule 3   ferrous gluconate   (FERGON) 324 MG tablet Take 324 mg by mouth at bedtime. (Patient not taking: Reported on 03/12/2024)     furosemide  (LASIX ) 80 MG tablet Take 80 mg by mouth daily. (Patient not taking: Reported on 03/12/2024)     Glycerin-Hypromellose-PEG 400 (VISINE DRY EYE OP) Apply 1 drop to eye 2 (two) times daily as needed (irritation). (Patient not taking: Reported on 03/12/2024)     ketorolac (ACULAR) 0.5 % ophthalmic solution Place 1 drop into the right eye 4 (four) times daily. (Patient not taking: Reported on 03/12/2024)     VELPHORO 500 MG chewable tablet Chew 500 mg by mouth 5 (five) times daily. (Patient not taking: Reported on 03/12/2024)     No current facility-administered medications for this visit.    Allergies Allergies  Allergen Reactions   Chlorhexidine  Rash   Fish Allergy Itching    Histories Past Medical History:  Diagnosis Date   Anemia    Cirrhosis (HCC)    Diabetes mellitus without complication (HCC)    ESRD (end stage renal disease) on dialysis (HCC)    Hypertension    Paroxysmal atrial fibrillation (HCC)    Type 2 diabetes mellitus (HCC)    Vision loss    Past Surgical History:  Procedure Laterality Date   AV FISTULA PLACEMENT Left 12/23/2021   Procedure: INSERTION OF LEFT ARM BRACHIOCEPHALIC ARTERIOVENOUS FISTULA;  Surgeon: Gretta Lonni PARAS, MD;  Location: [facility] OR;  Service: Vascular;  Laterality: Left;   BACK SURGERY     COLONOSCOPY     COLONOSCOPY N/A 01/08/2024   Procedure: COLONOSCOPY;  Surgeon: San Sandor GAILS, DO;  Location: [facility] ENDOSCOPY;  Service: Gastroenterology;  Laterality: N/A;   ESOPHAGOGASTRODUODENOSCOPY N/A 01/08/2024   Procedure: EGD (ESOPHAGOGASTRODUODENOSCOPY);  Surgeon: San Sandor GAILS, DO;  Location: [facility] ENDOSCOPY;  Service: Gastroenterology;  Laterality: N/A;   ESOPHAGOGASTRODUODENOSCOPY N/A 01/13/2024   Procedure: EGD (ESOPHAGOGASTRODUODENOSCOPY);  Surgeon: Legrand Victory LITTIE DOUGLAS, MD;  Location: [facility] ENDOSCOPY;  Service: Gastroenterology;  Laterality:  N/A;   HEMOSTASIS CLIP PLACEMENT  01/13/2024   Procedure: CONTROL OF HEMORRHAGE, GI TRACT, ENDOSCOPIC, BY CLIPPING OR OVERSEWING;  Surgeon: Legrand Victory LITTIE DOUGLAS, MD;  Location: [facility] ENDOSCOPY;  Service: Gastroenterology;;   IR FLUORO GUIDE CV LINE RIGHT  12/16/2021   IR US  GUIDE VASC ACCESS RIGHT  12/16/2021   PACEMAKER LEADLESS INSERTION N/A 01/15/2023   Procedure: PACEMAKER LEADLESS INSERTION;  Surgeon: Cindie Ole DASEN, MD;  Location: [facility] INVASIVE CV LAB;  Service: Cardiovascular;  Laterality: N/A;   PERICARDIOCENTESIS N/A 04/12/2023   Procedure: PERICARDIOCENTESIS;  Surgeon: Wendel Lurena POUR, MD;  Location: [facility] INVASIVE CV LAB;  Service: Cardiovascular;  Laterality: N/A;   TEMPORARY PACEMAKER N/A 01/13/2023   Procedure: TEMPORARY PACEMAKER;  Surgeon: Wendel Lurena POUR, MD;  Location: [facility] INVASIVE CV LAB;  Service: Cardiovascular;  Laterality: N/A;   UPPER GASTROINTESTINAL ENDOSCOPY     Social History   Socioeconomic History   Marital status: Married    Spouse name: Not on file   Number of children: Not on file   Years of education: Not on file   Highest education level: Not on file  Occupational History   Not on file  Tobacco Use   Smoking status: Never   Smokeless tobacco: Never  Vaping Use  Vaping status: Never Used  Substance and Sexual Activity   Alcohol use: Never    Comment: once in awhile   Drug use: Not Currently    Types: Marijuana    Comment: Use of THC tea 1-2 times per week   Sexual activity: Not on file  Other Topics Concern   Not on file  Social History Narrative   Not on file   Social Drivers of Health   Financial Resource Strain: Not on file  Food Insecurity: No Food Insecurity (01/13/2024)   Hunger Vital Sign    Worried About Running Out of Food in the Last Year: Never true    Ran Out of Food in the Last Year: Never true  Transportation Needs: No Transportation Needs (01/13/2024)   PRAPARE - Administrator, Civil Service (Medical): No    Lack of  Transportation (Non-Medical): No  Physical Activity: Not on file  Stress: Not on file  Social Connections: Moderately Integrated (01/13/2024)   Social Connection and Isolation Panel    Frequency of Communication with Friends and Family: Twice a week    Frequency of Social Gatherings with Friends and Family: Twice a week    Attends Religious Services: 1 to 4 times per year    Active Member of [employer] or Organizations: No    Attends Banker Meetings: Never    Marital Status: Married  Catering manager Violence: Not At Risk (01/13/2024)   Humiliation, Afraid, Rape, and Kick questionnaire    Fear of Current or Ex-Partner: No    Emotionally Abused: No    Physically Abused: No    Sexually Abused: No   Family History  Problem Relation Age of Onset   Diabetes Brother    Colon cancer Neg Hx    Esophageal cancer Neg Hx    Stomach cancer Neg Hx    Pancreatic cancer Neg Hx    Inflammatory bowel disease Neg Hx    Liver disease Neg Hx    Rectal cancer Neg Hx    Colon polyps Neg Hx    I have reviewed her medical, social, and family history in detail and updated the electronic medical record as necessary.    PHYSICAL EXAMINATION  BP (!) 144/72   Pulse 82   Ht 4' 10 (1.473 m)   Wt 156 lb 6 oz (70.9 kg)   SpO2 97%   BMI 32.68 kg/m  Wt Readings from Last 3 Encounters:  03/12/24 156 lb 6 oz (70.9 kg)  02/12/24 156 lb (70.8 kg)  01/14/24 155 lb 10.3 oz (70.6 kg)  GEN: NAD, appears stated age, doesn't appear chronically ill, interpreter available PSYCH: Cooperative, without pressured speech EYE: Conjunctivae pink, sclerae anicteric ENT: MMM CV: Nontachycardic RESP: No audible wheezing GI: NABS, soft, NT/ND, without rebound or guarding MSK/EXT: No lower extremity edema SKIN: No jaundice, no spider angiomata NEURO:  Alert & Oriented x 3, no focal deficits, no evidence of asterixis   REVIEW OF DATA  I reviewed the following data at the time of this encounter:  GI  Procedures and Studies  7/25 EGD - Normal esophagus. - Gastritis. Biopsied. - There was persistent oozing at the gastric body biopsy sites, presumably from a combination of elevated INR, thrombocytopenia, and overall platelet dysfunction from ESRD. Area was successfully injected with 3 mL PuraStat for hemostasis with cessation of bleeding - Normal examined duodenum. Biopsied.  7/25 Colonoscopy - Hemorrhoids found on perianal exam. - One 3 mm polyp in the ascending colon,  removed with a hot snare. Resected and retrieved. - Diverticulosis in the sigmoid colon and in the descending colon. - Non-bleeding internal hemorrhoids.  7/25 EGD retrieved. - Diverticulosis in the sigmoid colon and in the descending colon. - Non-bleeding internal hemorrhoids. - Portal hypertensive gastropathy. - Non-bleeding gastric ulcers with pigmented material (from recent biopsy sites). Clips (MR conditional) were placed. - Normal examined duodenum. - No specimens collected.   Laboratory Studies  Reviewed those in epic and care everywhere  Imaging Studies  September 2022 abdominal ultrasound Reviewed those in epic and care everywhere IMPRESSION: 1. Slight increased gallbladder wall thickness, nonspecific, negative for shadowing stone or sonographic Murphy 2. Heterogeneous liver echotexture likely related to heterogeneous fat infiltration 3. Possible 1.5 cm echogenic splenic mass. This could be further evaluated with MRI   ASSESSMENT  Ms. Hatcher is a 73 y.o. female with a pmh significant for diabetes, hypertension, hyperlipidemia, chronic back pain, PUD (manifested as GU with persisting GU in 2022), hiatal hernia, diverticulosis, hemorrhoids, iron  deficiency, fatty liver.  The patient is seen today for evaluation and management of:  No diagnosis found.  The patient is clinically and hemodynamically stable.  She is doing well on PPI therapy which is treating her gastric ulcer as well as her previous dyspepsia.  We  discussed the role of repeat endoscopic evaluation to ensure healing of her gastric ulcer in a few more months.  She has some concerns about her ability to get to that procedure since she does not have transportation.  We will ask [place] transportation to see if she may be able to have some assistance in this case and near future.  Patient has had some iron  deficiency without overt anemia.  She was not able to initiate oral iron  supplementation.  She has had upper and lower endoscopic evaluation in the recent past in [place] but if iron  deficiency persists after initiation of oral iron  supplementation then we will need to consider colonoscopy evaluation at the time of her follow-up endoscopy.  We will give her 2 months of oral iron  therapy and recheck her labs in December and subsequently in January reevaluate her in clinic and then decide as to whether we pursue both upper and lower endoscopy.  She will remain on her current PPI dosing.  We will send a prescription to her current pharmacy and then work on a [place] pharmacy mailing service to help her get her medications.  Her iron  will be sent to her current pharmacy and also then sent to [facility] health pharmacy for mailing service.  The patient would like a referral to a new PCP in this area that may have interpreter services available as well so we will place that.  She is also asking for an ophthalmology referral and we will place that today.  In regards to the splenic lesion cross-sectional imaging is recommended and we will move forward with an MRI abdomen at the patient's convenience to further define the spleen and ensure there are no other lesions present.  Her longer standing thrombocytopenia will also be able to visualize whether there is any evidence of splenomegaly or any other changes in the liver.  We will repeat her liver tests to see how they look now that she has been abstinent of all alcohol.  All patient questions were answered to the best  of my ability, and the patient agrees to the aforementioned plan of action with follow-up as indicated.   PLAN  Laboratories as outlined bel proceed with  scheduling MRI abdomen with and without contrast Continue Nexium  40 mg daily Initiate ferrous gluconate  324 mg daily Repeat CMP/INR/iron /TIBC/ferritin in December 2022 Follow-up in clinic in January 2023 - We will consider EGD/colonoscopy based on iron  levels versus just EGD for surveillance of previous gastric ulcer Continue alcohol cessation Holding on liver biopsy for now PCP referral placed Ophthalmology referral placed   No orders of the defined types were placed in this encounter.    New Prescriptions   No medications on file   Modified Medications   No medications on file    Planned Follow Up No follow-ups on file.   Total Time in Face-to-Face and in Coordination of Care for patient including independent/personal interpretation/review of prior testing, medical history, examination, medication adjustment, communicating results with the patient directly, and documentation with the EHR is 25 minutes.   Aloha Finner, MD [facility] Advanced Endoscopy Office # [phone]

## 2024-03-12 NOTE — Patient Instructions (Addendum)
 Your provider has requested that you go to the basement level for lab work before leaving today. Press B on the elevator. The lab is located at the first door on the left as you exit the elevator.  You will be contacted to schedule Endoscopy at the hospital for November 2025.   _______________________________________________________  If your blood pressure at your visit was 140/90 or greater, please contact your primary care physician to follow up on this.  _______________________________________________________  If you are age 71 or older, your body mass index should be between 23-30. Your Body mass index is 32.68 kg/m. If this is out of the aforementioned range listed, please consider follow up with your Primary Care Provider.  If you are age 52 or younger, your body mass index should be between 19-25. Your Body mass index is 32.68 kg/m. If this is out of the aformentioned range listed, please consider follow up with your Primary Care Provider.   ________________________________________________________  The [facility] providers would like to encourage you to use MYCHART to communicate with providers for non-urgent requests or questions.  Due to long hold times on the telephone, sending your provider a message by [facility] may be a faster and more efficient way to get a response.  Please allow 48 business hours for a response.  Please remember that this is for non-urgent requests.  _______________________________________________________  [facility] is using a team-based approach to care.  Your team is made up of your doctor and two to three APPS. Our APPS (Nurse Practitioners and Physician Assistants) work with your physician to ensure care continuity for you. They are fully qualified to address your health concerns and develop a treatment plan. They communicate directly with your gastroenterologist to care for you. Seeing the Advanced Practice Practitioners on your physician's  team can help you by facilitating care more promptly, often allowing for earlier appointments, access to diagnostic testing, procedures, and other specialty referrals.   Thank you for choosing me and [facility].  Dr. Wilhel[place]ia

## 2024-03-15 ENCOUNTER — Encounter: Payer: Self-pay | Admitting: Gastroenterology

## 2024-03-15 DIAGNOSIS — D649 Anemia, unspecified: Secondary | ICD-10-CM | POA: Insufficient documentation

## 2024-03-15 DIAGNOSIS — Z860101 Personal history of adenomatous and serrated colon polyps: Secondary | ICD-10-CM | POA: Insufficient documentation

## 2024-03-17 ENCOUNTER — Other Ambulatory Visit: Payer: Self-pay | Admitting: Student

## 2024-03-18 ENCOUNTER — Ambulatory Visit: Payer: Self-pay | Admitting: Gastroenterology

## 2024-03-20 ENCOUNTER — Other Ambulatory Visit: Payer: Self-pay

## 2024-03-20 DIAGNOSIS — Z8711 Personal history of peptic ulcer disease: Secondary | ICD-10-CM

## 2024-03-20 DIAGNOSIS — K7469 Other cirrhosis of liver: Secondary | ICD-10-CM

## 2024-03-20 DIAGNOSIS — D500 Iron deficiency anemia secondary to blood loss (chronic): Secondary | ICD-10-CM

## 2024-05-02 ENCOUNTER — Ambulatory Visit: Attending: Family Medicine

## 2024-05-03 ENCOUNTER — Other Ambulatory Visit: Payer: Self-pay | Admitting: Gastroenterology

## 2024-05-07 ENCOUNTER — Telehealth: Payer: Self-pay | Admitting: Gastroenterology

## 2024-05-07 NOTE — Telephone Encounter (Addendum)
 Procedure:Endoscopy Procedure date: 05/15/24 Procedure location: [facility] Arrival Time: 7:30 am Spoke with the patient Y/N: Yes called [phone] and talked with pt through , [place] ID [id number], the language line on 05/07/24 @ 2:32 pm. Any prep concerns? No  Has the patient obtained the prep from the pharmacy ? No prep needed Do you have a care partner and transportation: Yes Any additional concerns? No

## 2024-05-08 ENCOUNTER — Encounter (HOSPITAL_COMMUNITY): Payer: Self-pay | Admitting: Gastroenterology

## 2024-05-08 NOTE — Progress Notes (Signed)
 Pre op call Stacey Chambers   PCP- Sigrid Kays MD Cardiologist- Tobb MD Pulmonologist-n/a  EKG-01/15/24 Echo-07/24/23 Cath-04/12/23 Stress-n/a ICD/PM-PM medtronic GLP1-n/a Blood Thinner-n/a  History: HTN,ESRD, Cirrhosis, Afib, Complete Heart Block, Cardiac Tamponade, Pericardial Effusion. Patient last saw her cardiologist 02/12/24 with f/u in 5 months. Per patient feeling good no new heart or breathing issues, no equipment. Confirms not on diabetic meds or blood thinners due to  short lived afib and IDA. Anesthesia Review- Yes- okay to proceed

## 2024-05-14 NOTE — Anesthesia Preprocedure Evaluation (Addendum)
 Anesthesia Evaluation  Patient identified by MRN, date of birth, ID band Patient awake    Reviewed: Allergy & Precautions, NPO status , Patient's Chart, lab work & pertinent test results  History of Anesthesia Complications Negative for: history of anesthetic complications  Airway Mallampati: II  TM Distance: >3 FB Neck ROM: Full    Dental  (+) Edentulous Upper, Edentulous Lower   Pulmonary neg pulmonary ROS, neg shortness of breath, neg sleep apnea, neg COPD, neg recent URI   Pulmonary exam normal breath sounds clear to auscultation       Cardiovascular hypertension, (-) angina +CHF  (-) Past MI, (-) Cardiac Stents and (-) CABG + dysrhythmias Atrial Fibrillation + pacemaker (placed for CHB) + Valvular Problems/Murmurs (mild MR, severe TR)  Rhythm:Regular Rate:Normal  pericardial effusion with cardiac tamponade in 04/2023  TTE 07/24/2023: IMPRESSIONS    1. Left ventricular ejection fraction, by estimation, is 65 to 70%. The  left ventricle has normal function. The left ventricle has no regional  wall motion abnormalities. Indeterminate diastolic filling due to E-A  fusion.   2. Large pleural effusion in the right lateral region.   3. Mild mitral valve regurgitation.   4. Tricuspid valve regurgitation is severe.   5. The aortic valve has an indeterminant number of cusps. There is  moderate calcification of the aortic valve. Aortic valve regurgitation is  not visualized. Aortic valve sclerosis is present, with no evidence of  aortic valve stenosis.   6. There is mildly elevated pulmonary artery systolic pressure.   7. The inferior vena cava is normal in size with <50% respiratory  variability, suggesting right atrial pressure of 8 mmHg.     Neuro/Psych negative neurological ROS     GI/Hepatic PUD,GERD  Medicated,,(+) Cirrhosis         Endo/Other  diabetes, Type 2    Renal/GU ESRF and DialysisRenal disease (last HD  yesterday)     Musculoskeletal   Abdominal  (+) + obese  Peds  Hematology  (+) Blood dyscrasia (thrombocytopenia), anemia Lab Results      Component                Value               Date                      WBC                      4.6                 03/12/2024                HGB                      12.0                03/12/2024                HCT                      35.4 (L)            03/12/2024                MCV                      98.5  03/12/2024                PLT                      71.0 (L)            03/12/2024              Anesthesia Other Findings   Reproductive/Obstetrics                              Anesthesia Physical Anesthesia Plan  ASA: 4  Anesthesia Plan: MAC   Post-op Pain Management: Minimal or no pain anticipated   Induction: Intravenous  PONV Risk Score and Plan: 2 and Propofol  infusion, TIVA and Treatment may vary due to age or medical condition  Airway Management Planned: Natural Airway and Nasal Cannula  Additional Equipment:   Intra-op Plan:   Post-operative Plan:   Informed Consent: I have reviewed the patients History and Physical, chart, labs and discussed the procedure including the risks, benefits and alternatives for the proposed anesthesia with the patient or authorized representative who has indicated his/her understanding and acceptance.       Plan Discussed with: CRNA and Anesthesiologist  Anesthesia Plan Comments: (Discussed with patient risks of MAC including, but not limited to, minor pain or discomfort, hearing people in the room, and possible need for backup general anesthesia. Risks for general anesthesia also discussed including, but not limited to, sore throat, hoarse voice, chipped/damaged teeth, injury to vocal cords, nausea and vomiting, allergic reactions, lung infection, heart attack, stroke, and death. All questions answered. )         Anesthesia Quick  Evaluation

## 2024-05-15 ENCOUNTER — Telehealth: Payer: Self-pay

## 2024-05-15 ENCOUNTER — Ambulatory Visit (HOSPITAL_COMMUNITY)
Admission: RE | Admit: 2024-05-15 | Discharge: 2024-05-15 | Disposition: A | Discharge status: Home / Self Care | Attending: Gastroenterology | Admitting: Gastroenterology

## 2024-05-15 ENCOUNTER — Other Ambulatory Visit: Payer: Self-pay

## 2024-05-15 ENCOUNTER — Ambulatory Visit (HOSPITAL_COMMUNITY): Admitting: Certified Registered Nurse Anesthetist

## 2024-05-15 ENCOUNTER — Encounter (HOSPITAL_COMMUNITY)
Admission: RE | Disposition: A | Discharge status: Home / Self Care | Payer: Self-pay | Source: Home / Self Care | Attending: Gastroenterology

## 2024-05-15 ENCOUNTER — Encounter (HOSPITAL_COMMUNITY): Payer: Self-pay | Admitting: Gastroenterology

## 2024-05-15 DIAGNOSIS — Z8711 Personal history of peptic ulcer disease: Secondary | ICD-10-CM | POA: Diagnosis not present

## 2024-05-15 DIAGNOSIS — K3189 Other diseases of stomach and duodenum: Secondary | ICD-10-CM | POA: Diagnosis not present

## 2024-05-15 DIAGNOSIS — I132 Hypertensive heart and chronic kidney disease with heart failure and with stage 5 chronic kidney disease, or end stage renal disease: Secondary | ICD-10-CM | POA: Diagnosis not present

## 2024-05-15 DIAGNOSIS — D500 Iron deficiency anemia secondary to blood loss (chronic): Secondary | ICD-10-CM

## 2024-05-15 DIAGNOSIS — E1122 Type 2 diabetes mellitus with diabetic chronic kidney disease: Secondary | ICD-10-CM | POA: Diagnosis not present

## 2024-05-15 DIAGNOSIS — N186 End stage renal disease: Secondary | ICD-10-CM | POA: Diagnosis not present

## 2024-05-15 DIAGNOSIS — I120 Hypertensive chronic kidney disease with stage 5 chronic kidney disease or end stage renal disease: Secondary | ICD-10-CM | POA: Diagnosis not present

## 2024-05-15 DIAGNOSIS — I081 Rheumatic disorders of both mitral and tricuspid valves: Secondary | ICD-10-CM | POA: Diagnosis not present

## 2024-05-15 DIAGNOSIS — K2289 Other specified disease of esophagus: Secondary | ICD-10-CM | POA: Diagnosis not present

## 2024-05-15 DIAGNOSIS — D509 Iron deficiency anemia, unspecified: Secondary | ICD-10-CM | POA: Diagnosis present

## 2024-05-15 DIAGNOSIS — K449 Diaphragmatic hernia without obstruction or gangrene: Secondary | ICD-10-CM | POA: Insufficient documentation

## 2024-05-15 DIAGNOSIS — K2980 Duodenitis without bleeding: Secondary | ICD-10-CM | POA: Insufficient documentation

## 2024-05-15 DIAGNOSIS — K2970 Gastritis, unspecified, without bleeding: Secondary | ICD-10-CM | POA: Insufficient documentation

## 2024-05-15 DIAGNOSIS — Z992 Dependence on renal dialysis: Secondary | ICD-10-CM | POA: Insufficient documentation

## 2024-05-15 DIAGNOSIS — Z950 Presence of cardiac pacemaker: Secondary | ICD-10-CM | POA: Insufficient documentation

## 2024-05-15 DIAGNOSIS — K766 Portal hypertension: Secondary | ICD-10-CM | POA: Diagnosis not present

## 2024-05-15 DIAGNOSIS — K7469 Other cirrhosis of liver: Secondary | ICD-10-CM

## 2024-05-15 DIAGNOSIS — K279 Peptic ulcer, site unspecified, unspecified as acute or chronic, without hemorrhage or perforation: Secondary | ICD-10-CM | POA: Diagnosis not present

## 2024-05-15 DIAGNOSIS — I509 Heart failure, unspecified: Secondary | ICD-10-CM | POA: Diagnosis not present

## 2024-05-15 DIAGNOSIS — I480 Paroxysmal atrial fibrillation: Secondary | ICD-10-CM | POA: Diagnosis not present

## 2024-05-15 HISTORY — PX: ESOPHAGOGASTRODUODENOSCOPY: SHX5428

## 2024-05-15 HISTORY — DX: Presence of cardiac pacemaker: Z95.0

## 2024-05-15 LAB — POCT I-STAT, CHEM 8
BUN: 26 mg/dL — ABNORMAL HIGH (ref 8–23)
Calcium, Ion: 1.01 mmol/L — ABNORMAL LOW (ref 1.15–1.40)
Chloride: 101 mmol/L (ref 98–111)
Creatinine, Ser: 4.9 mg/dL — ABNORMAL HIGH (ref 0.44–1.00)
Glucose, Bld: 127 mg/dL — ABNORMAL HIGH (ref 70–99)
HCT: 28 % — ABNORMAL LOW (ref 36.0–46.0)
Hemoglobin: 9.5 g/dL — ABNORMAL LOW (ref 12.0–15.0)
Potassium: 5.1 mmol/L (ref 3.5–5.1)
Sodium: 138 mmol/L (ref 135–145)
TCO2: 28 mmol/L (ref 22–32)

## 2024-05-15 SURGERY — EGD (ESOPHAGOGASTRODUODENOSCOPY)
Anesthesia: Monitor Anesthesia Care

## 2024-05-15 MED ORDER — PROPOFOL 500 MG/50ML IV EMUL
INTRAVENOUS | Status: AC
  Filled 2024-05-15: qty 50

## 2024-05-15 MED ORDER — LIDOCAINE 2% (20 MG/ML) 5 ML SYRINGE
INTRAMUSCULAR | Status: DC | PRN
  Administered 2024-05-15: 50 mg via INTRAVENOUS

## 2024-05-15 MED ORDER — SODIUM CHLORIDE 0.9 % IV SOLN: INTRAVENOUS | Status: DC

## 2024-05-15 MED ORDER — PROPOFOL 10 MG/ML IV BOLUS
INTRAVENOUS | Status: DC | PRN
  Administered 2024-05-15: 30 mg via INTRAVENOUS

## 2024-05-15 MED ORDER — PROPOFOL 500 MG/50ML IV EMUL
INTRAVENOUS | Status: DC | PRN
Start: 2024-05-15 — End: 2024-05-15
  Administered 2024-05-15: 125 ug/kg/min via INTRAVENOUS

## 2024-05-15 NOTE — Discharge Instructions (Signed)

## 2024-05-15 NOTE — Telephone Encounter (Signed)
-----   Message from [facility] sent at 05/15/2024  9:11 AM EST ----- Regarding: Followup Stacey Chambers, Followup EGD recall in 3-years. Followup in clinic with APP or myself in 3 months. Thanks. GM

## 2024-05-15 NOTE — Op Note (Signed)
 [facility] Patient Name: Stacey Chambers Procedure Date: 05/15/2024 MRN: [id number] Attending MD: Aloha Finner , MD, [id number] Date of Birth: 12-16-1950 CSN: [id number] Age: 73 Admit Type: Ambulatory Procedure:                Upper GI endoscopy Indications:              Iron  deficiency anemia, Follow-up of peptic ulcer Providers:                Aloha Finner, MD, Ozell Pouch, [place], Technician Referring MD:              Medicines:                Monitored Anesthesia Care Complications:            No immediate complications. Estimated Blood Loss:     Estimated blood loss was minimal. Procedure:                Pre-Anesthesia Assessment:                           - Prior to the procedure, a History and Physical                            was performed, and patient medications and                            allergies were reviewed. The patient's tolerance of                            previous anesthesia was also reviewed. The risks                            and benefits of the procedure and the sedation                            options and risks were discussed with the patient.                            All questions were answered, and informed consent                            was obtained. Prior Anticoagulants: The patient has                            taken no anticoagulant or antiplatelet agents. ASA                            Grade Assessment: III - A patient with severe                            systemic disease. After reviewing the risks and  benefits, the patient was deemed in satisfactory                            condition to undergo the procedure.                           After obtaining informed consent, the endoscope was                            passed under direct vision. Throughout the                            procedure, the patient's blood pressure, pulse, and                             oxygen saturations were monitored continuously. The                            [id number]-[id number] ([id number]) Olympus endoscope was                            introduced through the mouth, and advanced to the                            third part of duodenum. The upper GI endoscopy was                            accomplished without difficulty. The patient                            tolerated the procedure. Scope In: Scope Out: Findings:      No gross lesions were noted in the entire esophagus (no varices).      The Z-line was irregular and was found 40 cm from the incisors.      A 1 cm hiatal hernia was present.      Mild portal hypertensive gastropathy was found in the entire examined       stomach.      Localized moderate inflammation characterized by congestion (edema),       erythema and nodularity was found in the prepyloric region of the       stomach.      Patchy mild inflammation characterized by congestion (edema) and       nodularity was found in the duodenal bulb.      No other gross lesions were noted in the duodenal sweep and second       portion of the duodenum and in the third portion of the duodenum. Impression:               - No gross lesions in the entire esophagus (no                            varices). Z-line irregular, 40 cm from the incisors.                           - 1 cm hiatal hernia.                           -  Portal hypertensive gastropathy.                           - Nodular gastritis noted in prepylorus.                           - Nodular duodenitis noted in bulb.                           - No gross lesions in the duodenal sweep and second                            portion of the duodenum and in the third portion of                            the duodenum. Moderate Sedation:      Not Applicable - Patient had care per Anesthesia. Recommendation:           - The patient will be observed post-procedure,                             until all discharge criteria are met.                           - Discharge patient to home.                           - Patient has a contact number available for                            emergencies. The signs and symptoms of potential                            delayed complications were discussed with the                            patient. Return to normal activities tomorrow.                            Written discharge instructions were provided to the                            patient.                           - Resume previous diet.                           - Continue present medications.                           - Repeat upper endoscopy in 3 years for                            surveillance.                           -  If patient redevelops anemia with iron                             deficiency, would recommend that patient undergo a                            VCE for further evaluation of the small bowel (last                            numbers from 2 months ago showed normalization of                            hemogram).                           - The findings and recommendations were discussed                            with the patient.                           - The findings and recommendations were discussed                            with the designated responsible adult. Procedure Code(s):        --- Professional ---                           [id number], Esophagogastroduodenoscopy, flexible,                            transoral; diagnostic, including collection of                            specimen(s) by brushing or washing, when performed                            (separate procedure) Diagnosis Code(s):        --- Professional ---                           K22.89, Other specified disease of esophagus                           K44.9, Diaphragmatic hernia without obstruction or                            gangrene                           K76.6, Portal  hypertension                           K31.89, Other diseases of stomach and duodenum                           K29.70, Gastritis, unspecified, without  bleeding                           K29.80, Duodenitis without bleeding                           D50.9, Iron  deficiency anemia, unspecified                           K27.9, Peptic ulcer, site unspecified, unspecified                            as acute or chronic, without hemorrhage or                            perforation CPT copyright 2022 American Medical Association. All rights reserved. The codes documented in this report are preliminary and upon coder review may  be revised to meet current compliance requirements. Aloha Finner, MD 05/15/2024 9:10:26 AM Number of Addenda: 0

## 2024-05-15 NOTE — Anesthesia Procedure Notes (Signed)
 Procedure Name: MAC Date/Time: 05/15/2024 8:42 AM  Performed by: Judythe Tanda Aran, CRNAPre-anesthesia Checklist: Patient identified, Emergency Drugs available, Suction available and Patient being monitored Patient Re-evaluated:Patient Re-evaluated prior to induction Oxygen Delivery Method: Nasal cannula

## 2024-05-15 NOTE — Telephone Encounter (Signed)
 Recalls have been entered

## 2024-05-15 NOTE — Anesthesia Postprocedure Evaluation (Signed)
 Anesthesia Post Note  Patient: Stacey Chambers  Procedure(s) Performed: EGD (ESOPHAGOGASTRODUODENOSCOPY)     Patient location during evaluation: PACU Anesthesia Type: MAC Level of consciousness: awake Pain management: pain level controlled Vital Signs Assessment: post-procedure vital signs reviewed and stable Respiratory status: spontaneous breathing, nonlabored ventilation and respiratory function stable Cardiovascular status: stable and blood pressure returned to baseline Postop Assessment: no apparent nausea or vomiting Anesthetic complications: no   No notable events documented.  Last Vitals:  Vitals:   05/15/24 0910 05/15/24 0915  BP: (!) 177/64   Pulse: 82 83  Resp: 19 (!) 22  Temp:    SpO2: 96% 98%    Last Pain:  Vitals:   05/15/24 0915  TempSrc:   PainSc: 0-No pain                 Delon Aisha Arch

## 2024-05-15 NOTE — H&P (Signed)
 GASTROENTEROLOGY PROCEDURE H&P NOTE   Primary Care Physician: Sigrid Kays, [place], MD  HPI: Stacey Chambers is a 73 y.o. female who presents for EGD for followup of previous PUD and Iron  deficiency.  Past Medical History:  Diagnosis Date   Anemia    Cirrhosis (HCC)    Diabetes mellitus without complication (HCC)    ESRD (end stage renal disease) on dialysis (HCC)    Hypertension    Paroxysmal atrial fibrillation (HCC)    Presence of permanent cardiac pacemaker    [employer]tr[employer]   Type 2 diabetes mellitus (HCC)    Vision loss    Past Surgical History:  Procedure Laterality Date   AV FISTULA PLACEMENT Left 12/23/2021   Procedure: INSERTION OF LEFT ARM BRACHIOCEPHALIC ARTERIOVENOUS FISTULA;  Surgeon: Gretta Lonni PARAS, MD;  Location: [facility] OR;  Service: Vascular;  Laterality: Left;   BACK SURGERY     COLONOSCOPY     COLONOSCOPY N/A 01/08/2024   Procedure: COLONOSCOPY;  Surgeon: San Sandor GAILS, DO;  Location: [facility] ENDOSCOPY;  Service: Gastroenterology;  Laterality: N/A;   ESOPHAGOGASTRODUODENOSCOPY N/A 01/08/2024   Procedure: EGD (ESOPHAGOGASTRODUODENOSCOPY);  Surgeon: San Sandor GAILS, DO;  Location: [facility] ENDOSCOPY;  Service: Gastroenterology;  Laterality: N/A;   ESOPHAGOGASTRODUODENOSCOPY N/A 01/13/2024   Procedure: EGD (ESOPHAGOGASTRODUODENOSCOPY);  Surgeon: Legrand Victory LITTIE DOUGLAS, MD;  Location: [facility] ENDOSCOPY;  Service: Gastroenterology;  Laterality: N/A;   HEMOSTASIS CLIP PLACEMENT  01/13/2024   Procedure: CONTROL OF HEMORRHAGE, GI TRACT, ENDOSCOPIC, BY CLIPPING OR OVERSEWING;  Surgeon: Legrand Victory LITTIE DOUGLAS, MD;  Location: [facility] ENDOSCOPY;  Service: Gastroenterology;;   IR FLUORO GUIDE CV LINE RIGHT  12/16/2021   IR US  GUIDE VASC ACCESS RIGHT  12/16/2021   PACEMAKER LEADLESS INSERTION N/A 01/15/2023   Procedure: PACEMAKER LEADLESS INSERTION;  Surgeon: Cindie Ole DASEN, MD;  Location: [facility] INVASIVE CV LAB;  Service: Cardiovascular;  Laterality: N/A;   PERICARDIOCENTESIS N/A 04/12/2023    Procedure: PERICARDIOCENTESIS;  Surgeon: Wendel Lurena POUR, MD;  Location: [facility] INVASIVE CV LAB;  Service: Cardiovascular;  Laterality: N/A;   TEMPORARY PACEMAKER N/A 01/13/2023   Procedure: TEMPORARY PACEMAKER;  Surgeon: Wendel Lurena POUR, MD;  Location: [facility] INVASIVE CV LAB;  Service: Cardiovascular;  Laterality: N/A;   UPPER GASTROINTESTINAL ENDOSCOPY     No current facility-administered medications for this encounter.   No current facility-administered medications for this encounter. Allergies  Allergen Reactions   Chlorhexidine  Rash   Fish Allergy Itching   Family History  Problem Relation Age of Onset   Diabetes Brother    Colon cancer Neg Hx    Esophageal cancer Neg Hx    Stomach cancer Neg Hx    Pancreatic cancer Neg Hx    Inflammatory bowel disease Neg Hx    Liver disease Neg Hx    Rectal cancer Neg Hx    Colon polyps Neg Hx    Social History   Socioeconomic History   Marital status: Married    Spouse name: Not on file   Number of children: Not on file   Years of education: Not on file   Highest education level: Not on file  Occupational History   Not on file  Tobacco Use   Smoking status: Never   Smokeless tobacco: Never  Vaping Use   Vaping status: Never Used  Substance and Sexual Activity   Alcohol use: Never    Comment: once in awhile   Drug use: Not Currently    Types: Marijuana    Comment: Use of THC tea  1-2 times per week   Sexual activity: Not on file  Other Topics Concern   Not on file  Social History Narrative   Not on file   Social Drivers of Health   Financial Resource Strain: Not on file  Food Insecurity: No Food Insecurity (01/13/2024)   Hunger Vital Sign    Worried About Running Out of Food in the Last Year: Never true    Ran Out of Food in the Last Year: Never true  Transportation Needs: No Transportation Needs (01/13/2024)   PRAPARE - Administrator, Civil Service (Medical): No    Lack of Transportation (Non-Medical): No   Physical Activity: Not on file  Stress: Not on file  Social Connections: Moderately Integrated (01/13/2024)   Social Connection and Isolation Panel    Frequency of Communication with Friends and Family: Twice a week    Frequency of Social Gatherings with Friends and Family: Twice a week    Attends Religious Services: 1 to 4 times per year    Active Member of [employer] or Organizations: No    Attends Banker Meetings: Never    Marital Status: Married  Catering Manager Violence: Not At Risk (01/13/2024)   Humiliation, Afraid, Rape, and Kick questionnaire    Fear of Current or Ex-Partner: No    Emotionally Abused: No    Physically Abused: No    Sexually Abused: No    Physical Exam: Today's Vitals   05/08/24 1141 05/15/24 0726  BP:  (!) 178/74  Pulse:  93  Resp:  17  Temp:  97.7 F (36.5 C)  TempSrc:  Temporal  SpO2:  100%  Weight: 70 kg   PainSc:  0-No pain   Body mass index is 32.25 kg/m. GEN: NAD EYE: Sclerae anicteric ENT: MMM CV: Non-tachycardic GI: Soft, NT/ND NEURO:  Alert & Oriented x 3  Lab Results: Recent Labs    05/15/24 0735  HGB 9.5*  HCT 28.0*   BMET Recent Labs    05/15/24 0735  NA 138  K 5.1  CL 101  GLUCOSE 127*  BUN 26*  CREATININE 4.90*   LFT No results for input(s): PROT, ALBUMIN , AST, ALT, ALKPHOS, BILITOT, BILIDIR, IBILI in the last 72 hours. PT/INR No results for input(s): LABPROT, INR in the last 72 hours.   Impression / Plan: This is a 73 y.o.female who presents for EGD for followup of previous PUD and Iron  deficiency.  The risks and benefits of endoscopic evaluation/treatment were discussed with the patient and/or family; these include but are not limited to the risk of perforation, infection, bleeding, missed lesions, lack of diagnosis, severe illness requiring hospitalization, as well as anesthesia and sedation related illnesses.  The patient's history has been reviewed, patient examined, no change  in status, and deemed stable for procedure.  The patient and/or family was provided an opportunity to ask questions and all were answered.  The patient and/or family is agreeable to proceed.    Aloha Finner, MD [facility] Office # [phone]

## 2024-05-15 NOTE — Transfer of Care (Signed)
 Immediate Anesthesia Transfer of Care Note  Patient: Stacey Chambers  Procedure(s) Performed: EGD (ESOPHAGOGASTRODUODENOSCOPY)  Patient Location: Endoscopy Unit  Anesthesia Type:MAC  Level of Consciousness: drowsy  Airway & Oxygen Therapy: Patient Spontanous Breathing and Patient connected to face mask  Post-op Assessment: Report given to RN and Post -op Vital signs reviewed and stable  Post vital signs: Reviewed and stable  Last Vitals:  Vitals Value Taken Time  BP    Temp    Pulse    Resp    SpO2      Last Pain:  Vitals:   05/15/24 0726  TempSrc: Temporal  PainSc: 0-No pain         Complications: No notable events documented.

## 2024-05-18 ENCOUNTER — Encounter (HOSPITAL_COMMUNITY): Payer: Self-pay | Admitting: Gastroenterology

## 2024-06-23 ENCOUNTER — Ambulatory Visit: Attending: Cardiology | Admitting: Cardiology

## 2024-06-23 VITALS — BP 140/58 | HR 82 | Ht <= 58 in | Wt 153.0 lb

## 2024-06-23 DIAGNOSIS — N186 End stage renal disease: Secondary | ICD-10-CM | POA: Diagnosis not present

## 2024-06-23 DIAGNOSIS — I480 Paroxysmal atrial fibrillation: Secondary | ICD-10-CM

## 2024-06-23 DIAGNOSIS — Z950 Presence of cardiac pacemaker: Secondary | ICD-10-CM

## 2024-06-23 DIAGNOSIS — Z992 Dependence on renal dialysis: Secondary | ICD-10-CM | POA: Diagnosis not present

## 2024-06-23 DIAGNOSIS — I5032 Chronic diastolic (congestive) heart failure: Secondary | ICD-10-CM | POA: Diagnosis not present

## 2024-06-23 NOTE — Patient Instructions (Signed)

## 2024-06-23 NOTE — Progress Notes (Unsigned)
 " Cardiology Office Note:    Date:  06/24/2024   ID:  Stacey Chambers, Stacey Chambers 05-Jun-1951, MRN [id number]  PCP:  Sigrid Deidra Fox, MD  Cardiologist:  Dub Huntsman, DO  Electrophysiologist:  OLE ONEIDA HOLTS, MD   Referring MD: Sigrid Deidra, Fox, MD   No chief complaint on file.   History of Present Illness:    Stacey Chambers is a 73 y.o. female with a hx of hronic HFpEF, pericardial effusion with cardiac tamponade in 04/2023, paroxysmal atrial fibrillation not on anticoagulation, complete heart block s/p micra leadless PPM in 01/2023, hypertension, type 2 diabetes mellitus, hypothyroidism, alcoholic cirrhosis, ESRD on M/W/F, ecent GI bleed in 01/2024 requiring blood transfusions, chronic anemia, and thrombocytopenia   She previously followed with Dr. Alvan. Then was recently seen by Callie Goodrich,PA.  This is our first visit together.   We have the iPad interpreter today.  She came early prior to the in person interpreter getting here.  Today she describes sensation of palpitations.  She had been sent of a ZIO monitor a few weeks ago but has not worn it. She was recently diagnosed with osteoporosis and receives vitamin D injections during thrice weekly dialysis.  She denies any chest pain or shortness of breath.   Past Medical History:  Diagnosis Date   Anemia    Cirrhosis (HCC)    Diabetes mellitus without complication (HCC)    ESRD (end stage renal disease) on dialysis (HCC)    Hypertension    Paroxysmal atrial fibrillation (HCC)    Presence of permanent cardiac pacemaker    Medtronic   Type 2 diabetes mellitus (HCC)    Vision loss     Past Surgical History:  Procedure Laterality Date   AV FISTULA PLACEMENT Left 12/23/2021   Procedure: INSERTION OF LEFT ARM BRACHIOCEPHALIC ARTERIOVENOUS FISTULA;  Surgeon: Gretta Lonni PARAS, MD;  Location: [facility] OR;  Service: Vascular;  Laterality: Left;   BACK SURGERY     COLONOSCOPY     COLONOSCOPY N/A 01/08/2024    Procedure: COLONOSCOPY;  Surgeon: San Sandor GAILS, DO;  Location: [facility] ENDOSCOPY;  Service: Gastroenterology;  Laterality: N/A;   ESOPHAGOGASTRODUODENOSCOPY N/A 01/08/2024   Procedure: EGD (ESOPHAGOGASTRODUODENOSCOPY);  Surgeon: San Sandor GAILS, DO;  Location: [facility] ENDOSCOPY;  Service: Gastroenterology;  Laterality: N/A;   ESOPHAGOGASTRODUODENOSCOPY N/A 01/13/2024   Procedure: EGD (ESOPHAGOGASTRODUODENOSCOPY);  Surgeon: Legrand Victory LITTIE DOUGLAS, MD;  Location: [facility] ENDOSCOPY;  Service: Gastroenterology;  Laterality: N/A;   ESOPHAGOGASTRODUODENOSCOPY N/A 05/15/2024   Procedure: EGD (ESOPHAGOGASTRODUODENOSCOPY);  Surgeon: Wilhelmenia Aloha Raddle., MD;  Location: [facility];  Service: Gastroenterology;  Laterality: N/A;   HEMOSTASIS CLIP PLACEMENT  01/13/2024   Procedure: CONTROL OF HEMORRHAGE, GI TRACT, ENDOSCOPIC, BY CLIPPING OR OVERSEWING;  Surgeon: Legrand Victory LITTIE DOUGLAS, MD;  Location: [facility] ENDOSCOPY;  Service: Gastroenterology;;   IR FLUORO GUIDE CV LINE RIGHT  12/16/2021   IR US  GUIDE VASC ACCESS RIGHT  12/16/2021   PACEMAKER LEADLESS INSERTION N/A 01/15/2023   Procedure: PACEMAKER LEADLESS INSERTION;  Surgeon: Holts Ole ONEIDA, MD;  Location: [facility] INVASIVE CV LAB;  Service: Cardiovascular;  Laterality: N/A;   PERICARDIOCENTESIS N/A 04/12/2023   Procedure: PERICARDIOCENTESIS;  Surgeon: Wendel Lurena POUR, MD;  Location: [facility] INVASIVE CV LAB;  Service: Cardiovascular;  Laterality: N/A;   TEMPORARY PACEMAKER N/A 01/13/2023   Procedure: TEMPORARY PACEMAKER;  Surgeon: Wendel Lurena POUR, MD;  Location: [facility] INVASIVE CV LAB;  Service: Cardiovascular;  Laterality: N/A;   UPPER GASTROINTESTINAL ENDOSCOPY      Current Medications: Active  Medications[1]   Allergies:   Chlorhexidine  and Fish allergy   Social History   Socioeconomic History   Marital status: Married    Spouse name: Not on file   Number of children: Not on file   Years of education: Not on file   Highest education level: Not on file  Occupational  History   Not on file  Tobacco Use   Smoking status: Never   Smokeless tobacco: Never  Vaping Use   Vaping status: Never Used  Substance and Sexual Activity   Alcohol use: Never    Comment: once in awhile   Drug use: Not Currently    Types: Marijuana    Comment: Use of THC tea 1-2 times per week   Sexual activity: Not on file  Other Topics Concern   Not on file  Social History Narrative   Not on file   Social Drivers of Health   Tobacco Use: Low Risk (05/15/2024)   Patient History    Smoking Tobacco Use: Never    Smokeless Tobacco Use: Never    Passive Exposure: Not on file  Financial Resource Strain: Not on file  Food Insecurity: No Food Insecurity (01/13/2024)   Epic    Worried About Programme Researcher, Broadcasting/film/video in the Last Year: Never true    Ran Out of Food in the Last Year: Never true  Transportation Needs: No Transportation Needs (01/13/2024)   Epic    Lack of Transportation (Medical): No    Lack of Transportation (Non-Medical): No  Physical Activity: Not on file  Stress: Not on file  Social Connections: Moderately Integrated (01/13/2024)   Social Connection and Isolation Panel    Frequency of Communication with Friends and Family: Twice a week    Frequency of Social Gatherings with Friends and Family: Twice a week    Attends Religious Services: 1 to 4 times per year    Active Member of [employer] or Organizations: No    Attends Banker Meetings: Never    Marital Status: Married  Depression (PHQ2-9): Low Risk (07/12/2022)   Depression (PHQ2-9)    PHQ-2 Score: 0  Alcohol Screen: Not on file  Housing: Unknown (01/13/2024)   Epic    Unable to Pay for Housing in the Last Year: No    Number of Times Moved in the Last Year: Not on file    Homeless in the Last Year: No  Utilities: Not At Risk (01/13/2024)   Epic    Threatened with loss of utilities: No  Health Literacy: Not on file     Family History: The patient's family history includes Diabetes in her brother.  There is no history of Colon cancer, Esophageal cancer, Stomach cancer, Pancreatic cancer, Inflammatory bowel disease, Liver disease, Rectal cancer, or Colon polyps.  ROS:   Review of Systems  Constitution: Negative for decreased appetite, fever and weight gain.  HENT: Negative for congestion, ear discharge, hoarse voice and sore throat.   Eyes: Negative for discharge, redness, vision loss in right eye and visual halos.  Cardiovascular: Negative for chest pain, dyspnea on exertion, leg swelling, orthopnea and palpitations.  Respiratory: Negative for cough, hemoptysis, shortness of breath and snoring.   Endocrine: Negative for heat intolerance and polyphagia.  Hematologic/Lymphatic: Negative for bleeding problem. Does not bruise/bleed easily.  Skin: Negative for flushing, nail changes, rash and suspicious lesions.  Musculoskeletal: Negative for arthritis, joint pain, muscle cramps, myalgias, neck pain and stiffness.  Gastrointestinal: Negative for abdominal pain, bowel incontinence, diarrhea and  excessive appetite.  Genitourinary: Negative for decreased libido, genital sores and incomplete emptying.  Neurological: Negative for brief paralysis, focal weakness, headaches and loss of balance.  Psychiatric/Behavioral: Negative for altered mental status, depression and suicidal ideas.  Allergic/Immunologic: Negative for HIV exposure and persistent infections.    EKGs/Labs/Other Studies Reviewed:    The following studies were reviewed today:   EKG:  The ekg ordered today demonstrates   Recent Labs: 01/12/2024: ALT 17 03/12/2024: Platelets 71.0 05/15/2024: BUN 26; Creatinine, Ser 4.90; Hemoglobin 9.5; Potassium 5.1; Sodium 138  Recent Lipid Panel    Component Value Date/Time   CHOL 144 03/30/2022 1327   TRIG 137 10/27/2022 0700   HDL 61.60 03/30/2022 1327   CHOLHDL 2 03/30/2022 1327   VLDL 21.2 03/30/2022 1327   LDLCALC 62 03/30/2022 1327    Physical Exam:    VS:  BP (!) 140/58 (BP  Location: Right Arm, Patient Position: Sitting, Cuff Size: Normal)   Pulse 82   Ht 4' 10 (1.473 m)   Wt 153 lb (69.4 kg)   SpO2 95%   BMI 31.98 kg/m     Wt Readings from Last 3 Encounters:  06/23/24 153 lb (69.4 kg)  05/08/24 154 lb 5.2 oz (70 kg)  03/12/24 156 lb 6 oz (70.9 kg)     GEN: Well nourished, well developed in no acute distress HEENT: Normal NECK: No JVD; No carotid bruits LYMPHATICS: No lymphadenopathy CARDIAC: S1S2 noted,RRR, no murmurs, rubs, gallops RESPIRATORY:  Clear to auscultation without rales, wheezing or rhonchi  ABDOMEN: Soft, non-tender, non-distended, +bowel sounds, no guarding. EXTREMITIES: No edema, No cyanosis, no clubbing MUSCULOSKELETAL:  No deformity  SKIN: Warm and dry NEUROLOGIC:  Alert and oriented x 3, non-focal PSYCHIATRIC:  Normal affect, good insight  ASSESSMENT:    1. Chronic diastolic CHF (congestive heart failure) (HCC)   2. PAF (paroxysmal atrial fibrillation) (HCC)   3. ESRD on dialysis (HCC)   4. S/P placement of leadless cardiac pacemaker    PLAN:    1.  Chronic diastolic heart failure-she clinically appears to be euvolemic, changing any medications at this time from there.   2.  Paroxysmal atrial fibrillation-not on anticoagulation due to GI bleeding she is not on AV nodal blockers as well.  Will continue to monitor closely.  When she wear the monitor we will see if she is having any significant A-fib recurrence.  3.  Complete heart block she is status post Medtronic leadless pacemaker  4.  Hypertension-blood pressure slightly elevated in the office today but did come down some.  She had not taken her medications until an hour before her visit.  But her also the other dilemma is her blood pressure drops significantly postdialysis.  If needed we will need to add additional medication on nondialysis days.    The patient is in agreement with the above plan. The patient left the office in stable condition.  The patient will  follow up in   Medication Adjustments/Labs and Tests Ordered: Current medicines are reviewed at length with the patient today.  Concerns regarding medicines are outlined above.  No orders of the defined types were placed in this encounter.  No orders of the defined types were placed in this encounter.   Patient Instructions  Medication Instructions:  Your physician recommends that you continue on your current medications as directed. Please refer to the Current Medication list given to you today.  *If you need a refill on your cardiac medications before your next appointment, please  call your pharmacy*   Follow-Up: At [facility], you and your health needs are our priority.  As part of our continuing mission to provide you with exceptional heart care, our providers are all part of one team.  This team includes your primary Cardiologist (physician) and Advanced Practice Providers or APPs (Physician Assistants and Nurse Practitioners) who all work together to provide you with the care you need, when you need it.  Your next appointment:   9 month(s)  Provider:   Pailyn Bellevue, DO     Adopting a Healthy Lifestyle.  Know what a healthy weight is for you (roughly BMI <25) and aim to maintain this   Aim for 7+ servings of fruits and vegetables daily   65-80+ fluid ounces of water  or unsweet tea for healthy kidneys   Limit to max 1 drink of alcohol per day; avoid smoking/tobacco   Limit animal fats in diet for cholesterol and heart health - choose grass fed whenever available   Avoid highly processed foods, and foods high in saturated/trans fats   Aim for low stress - take time to unwind and care for your mental health   Aim for 150 min of moderate intensity exercise weekly for heart health, and weights twice weekly for bone health   Aim for 7-9 hours of sleep daily   When it comes to diets, agreement about the perfect plan isnt easy to find, even among the  experts. Experts at the [facility] [employer] developed an idea known as the Healthy Eating Plate. Just imagine a plate divided into logical, healthy portions.   The emphasis is on diet quality:   Load up on vegetables and fruits - one-half of your plate: Aim for color and variety, and remember that potatoes dont count.   Go for whole grains - one-quarter of your plate: Whole wheat, barley, wheat berries, quinoa, oats, brown rice, and foods made with them. If you want pasta, go with whole wheat pasta.   Protein power - one-quarter of your plate: Fish, chicken, beans, and nuts are all healthy, versatile protein sources. Limit red meat.   The diet, however, does go beyond the plate, offering a few other suggestions.   Use healthy plant oils, such as olive, canola, soy, corn, sunflower and peanut. Check the labels, and avoid partially hydrogenated oil, which have unhealthy trans fats.   If youre thirsty, drink water . Coffee and tea are good in moderation, but skip sugary drinks and limit milk and dairy products to one or two daily servings.   The type of carbohydrate in the diet is more important than the amount. Some sources of carbohydrates, such as vegetables, fruits, whole grains, and beans-are healthier than others.   Finally, stay active  Signed, Ange Puskas, DO  06/24/2024 2:43 PM    [facility]     [1]  Current Meds  Medication Sig   acetaminophen  (TYLENOL ) 500 MG tablet Take 500 mg by mouth every 8 (eight) hours as needed for moderate pain.   albuterol  (VENTOLIN  HFA) 108 (90 Base) MCG/ACT inhaler Inhale 1-2 puffs into the lungs every 6 (six) hours as needed for wheezing or shortness of breath.   B Complex-C-Zn-Folic Acid  (DIALYVITE 800 WITH ZINC ) 0.8 MG TABS Take 1 tablet by mouth at bedtime.   benzonatate  (TESSALON ) 100 MG capsule Take 100 mg by mouth 3 (three) times daily as needed.   esomeprazole  (NEXIUM ) 40 MG capsule TAKE 1 CAPSULE (40  MG  TOTAL) BY MOUTH 2 (TWO) TIMES DAILY BEFORE A MEAL.   ferrous gluconate  (FERGON) 324 MG tablet Take 324 mg by mouth at bedtime.   furosemide  (LASIX ) 80 MG tablet Take 80 mg by mouth daily.   Glycerin-Hypromellose-PEG 400 (VISINE DRY EYE OP) Apply 1 drop to eye 2 (two) times daily as needed (irritation).   ketorolac (ACULAR) 0.5 % ophthalmic solution Place 1 drop into the right eye 4 (four) times daily.   lidocaine -prilocaine  (EMLA ) cream Apply 1 Application topically.   LOKELMA  10 g PACK packet Take 10 g by mouth daily.   sevelamer  carbonate (RENVELA ) 800 MG tablet Take 1 tablet (800 mg total) by mouth 3 (three) times daily with meals.   VELPHORO 500 MG chewable tablet Chew 500 mg by mouth 5 (five) times daily.   "

## 2024-07-07 ENCOUNTER — Emergency Department (HOSPITAL_COMMUNITY)

## 2024-07-07 ENCOUNTER — Encounter (HOSPITAL_COMMUNITY): Payer: Self-pay | Admitting: Emergency Medicine

## 2024-07-07 ENCOUNTER — Emergency Department (HOSPITAL_COMMUNITY)
Admission: EM | Admit: 2024-07-07 | Discharge: 2024-07-08 | Disposition: A | Discharge status: Home / Self Care | Attending: Emergency Medicine | Admitting: Emergency Medicine

## 2024-07-07 ENCOUNTER — Other Ambulatory Visit: Payer: Self-pay

## 2024-07-07 DIAGNOSIS — R600 Localized edema: Secondary | ICD-10-CM | POA: Insufficient documentation

## 2024-07-07 DIAGNOSIS — R0600 Dyspnea, unspecified: Secondary | ICD-10-CM | POA: Diagnosis present

## 2024-07-07 DIAGNOSIS — I120 Hypertensive chronic kidney disease with stage 5 chronic kidney disease or end stage renal disease: Secondary | ICD-10-CM | POA: Diagnosis not present

## 2024-07-07 DIAGNOSIS — N186 End stage renal disease: Secondary | ICD-10-CM | POA: Insufficient documentation

## 2024-07-07 DIAGNOSIS — Z992 Dependence on renal dialysis: Secondary | ICD-10-CM | POA: Insufficient documentation

## 2024-07-07 DIAGNOSIS — E1122 Type 2 diabetes mellitus with diabetic chronic kidney disease: Secondary | ICD-10-CM | POA: Diagnosis not present

## 2024-07-07 DIAGNOSIS — E875 Hyperkalemia: Secondary | ICD-10-CM

## 2024-07-07 LAB — BASIC METABOLIC PANEL WITH GFR
Anion gap: 16 — ABNORMAL HIGH (ref 5–15)
BUN: 37 mg/dL — ABNORMAL HIGH (ref 8–23)
CO2: 25 mmol/L (ref 22–32)
Calcium: 8.7 mg/dL — ABNORMAL LOW (ref 8.9–10.3)
Chloride: 97 mmol/L — ABNORMAL LOW (ref 98–111)
Creatinine, Ser: 5.62 mg/dL — ABNORMAL HIGH (ref 0.44–1.00)
GFR, Estimated: 8 mL/min — ABNORMAL LOW
Glucose, Bld: 113 mg/dL — ABNORMAL HIGH (ref 70–99)
Potassium: 5.6 mmol/L — ABNORMAL HIGH (ref 3.5–5.1)
Sodium: 137 mmol/L (ref 135–145)

## 2024-07-07 LAB — CBC
HCT: 30.2 % — ABNORMAL LOW (ref 36.0–46.0)
Hemoglobin: 10.2 g/dL — ABNORMAL LOW (ref 12.0–15.0)
MCH: 34.6 pg — ABNORMAL HIGH (ref 26.0–34.0)
MCHC: 33.8 g/dL (ref 30.0–36.0)
MCV: 102.4 fL — ABNORMAL HIGH (ref 80.0–100.0)
Platelets: 55 K/uL — ABNORMAL LOW (ref 150–400)
RBC: 2.95 MIL/uL — ABNORMAL LOW (ref 3.87–5.11)
RDW: 14.1 % (ref 11.5–15.5)
WBC: 4 K/uL (ref 4.0–10.5)
nRBC: 0 % (ref 0.0–0.2)

## 2024-07-07 LAB — TROPONIN T, HIGH SENSITIVITY: Troponin T High Sensitivity: 97 ng/L — ABNORMAL HIGH (ref 0–19)

## 2024-07-07 LAB — PRO BRAIN NATRIURETIC PEPTIDE: Pro Brain Natriuretic Peptide: 29860 pg/mL — ABNORMAL HIGH

## 2024-07-07 MED ORDER — SODIUM ZIRCONIUM CYCLOSILICATE 10 G PO PACK
10.0000 g | PACK | Freq: Once | ORAL | Status: AC
  Administered 2024-07-08: 10 g via ORAL
  Filled 2024-07-07: qty 1

## 2024-07-07 MED ORDER — FUROSEMIDE 10 MG/ML IJ SOLN
40.0000 mg | Freq: Once | INTRAMUSCULAR | Status: AC
  Administered 2024-07-08: 40 mg via INTRAVENOUS
  Filled 2024-07-07: qty 4

## 2024-07-07 MED ORDER — CHLORHEXIDINE GLUCONATE CLOTH 2 % EX PADS: 6.0000 | MEDICATED_PAD | Freq: Every day | CUTANEOUS | Status: DC

## 2024-07-07 NOTE — ED Triage Notes (Signed)
 [name] speaker pt states she has increase SOB she only got 30 min of HD today due to the HD center not having pt states she feels like she is drowning and not able to take a bread pt is on 2 LN on triage wit sat of 99%.

## 2024-07-07 NOTE — ED Provider Notes (Signed)
 " [place] EMERGENCY DEPARTMENT AT [facility] Provider Note  CSN: [id number] Arrival date & time: 07/07/24 2119  Chief Complaint(s) Shortness of Breath  HPI Stacey Chambers is a 74 y.o. female with past medical history as below, significant for ESRD on HD Monday Wednesday Friday, PAF, hypertension cirrhosis anemia who presents to the ED with complaint of dyspnea  Patient here with difficulty breathing.  She reports that she has not missed any dialysis sessions up until today.  Today she went for dialysis and there was a malfunction at the facility and she was only able to complete around 30 minutes of her dialysis.  She feels that she is volume overloaded.  Is having trouble breathing.  Difficulty lying flat.  She still makes urine.  Denies any missed dialysis sessions up until today  Past Medical History Past Medical History:  Diagnosis Date   Anemia    Cirrhosis (HCC)    Diabetes mellitus without complication (HCC)    ESRD (end stage renal disease) on dialysis (HCC)    Hypertension    Paroxysmal atrial fibrillation (HCC)    Presence of permanent cardiac pacemaker    Medtronic   Type 2 diabetes mellitus (HCC)    Vision loss    Patient Active Problem List   Diagnosis Date Noted   Hx of adenomatous colonic polyps 03/15/2024   Acute on chronic anemia 03/15/2024   Melena 01/13/2024   ABLA (acute blood loss anemia) 01/12/2024   GI bleeding 01/11/2024   Cecal polyp 01/08/2024   Rectal bleeding 01/08/2024   Diverticulosis of colon without hemorrhage 01/08/2024   Grade II internal hemorrhoids 01/08/2024   ESRD on dialysis (HCC) 01/08/2024   Iron  deficiency anemia 01/08/2024   Gastritis and gastroduodenitis 01/08/2024   CAP (community acquired pneumonia) 04/23/2023   Generalized weakness 04/23/2023   Chronic hyponatremia 04/23/2023   Anemia of chronic disease 04/23/2023   Acute left flank pain 04/19/2023   Status post pericardiocentesis 04/14/2023   S/P  placement of leadless cardiac pacemaker 04/14/2023   Pericardial effusion 04/12/2023   Chronic diastolic CHF (congestive heart failure) (HCC) 04/12/2023   Rapid atrial fibrillation (HCC) 04/12/2023   Paroxysmal atrial fibrillation (HCC) 04/12/2023   New onset atrial fibrillation (HCC) 04/11/2023   Near syncope 01/17/2023   Essential hypertension 01/15/2023   End-stage renal disease on hemodialysis (HCC) 01/15/2023   CHB (complete heart block) (HCC) 01/13/2023   Bradycardia 10/20/2022   Shock (HCC) 10/20/2022   Fluid overload 09/16/2022   Lumbar post-laminectomy syndrome 07/12/2022   Other chronic pain 07/12/2022   Sacrococcygeal disorders, not elsewhere classified 07/12/2022   Vertebrogenic low back pain 07/12/2022   Hypertension associated with diabetes (HCC) 03/30/2022   End stage renal disease on dialysis (HCC) 03/30/2022   Liver disease, chronic, with cirrhosis (HCC) 12/13/2021   Acute diastolic CHF (congestive heart failure) (HCC) 12/10/2021   COVID-19 virus infection 12/10/2021   Hypertensive urgency 12/10/2021   Diabetes mellitus type 2, controlled, without complications (HCC) 12/10/2021   Acute renal failure superimposed on stage 3b chronic kidney disease (HCC) 12/10/2021   Nephrotic syndrome 12/10/2021   Normocytic anemia 12/10/2021   Low serum albumin  12/10/2021   Thrombocytopenia 12/10/2021   Cirrhosis (HCC) 12/01/2021   Swelling of lower extremity 12/01/2021   Acute kidney injury superimposed on CKD 12/01/2021   DM2 (diabetes mellitus, type 2) (HCC) 12/01/2021   Abnormal LFTs 04/29/2021   Iron  deficiency 04/29/2021   Gastric ulcer 04/29/2021   Splenic lesion 04/29/2021   History of  gastric ulcer 03/14/2021   History of thrombocytopenia 03/14/2021   History of iron  deficiency anemia 03/14/2021   Macrocytosis 03/14/2021   Home Medication(s) Prior to Admission medications  Medication Sig Start Date End Date Taking? Authorizing Provider  acetaminophen  (TYLENOL )  500 MG tablet Take 500 mg by mouth every 8 (eight) hours as needed for moderate pain.    [provider]  albuterol  (VENTOLIN  HFA) 108 (90 Base) MCG/ACT inhaler Inhale 1-2 puffs into the lungs every 6 (six) hours as needed for wheezing or shortness of breath. 12/06/22   [provider]  B Complex-C-Zn-Folic Acid  (DIALYVITE 800 WITH ZINC ) 0.8 MG TABS Take 1 tablet by mouth at bedtime. 12/07/23   [provider]  benzonatate  (TESSALON ) 100 MG capsule Take 100 mg by mouth 3 (three) times daily as needed. 09/27/23   [provider]  esomeprazole  (NEXIUM ) 40 MG capsule TAKE 1 CAPSULE (40 MG TOTAL) BY MOUTH 2 (TWO) TIMES DAILY BEFORE A MEAL. 05/05/24 11/01/24  Heinz, Camie BRAVO, PA-C  ferrous gluconate  (FERGON) 324 MG tablet Take 324 mg by mouth at bedtime.    [provider]  furosemide  (LASIX ) 80 MG tablet Take 80 mg by mouth daily. 01/01/24   [provider]  Glycerin-Hypromellose-PEG 400 (VISINE DRY EYE OP) Apply 1 drop to eye 2 (two) times daily as needed (irritation).    [provider]  ketorolac (ACULAR) 0.5 % ophthalmic solution Place 1 drop into the right eye 4 (four) times daily. 04/11/23   [provider]  lidocaine -prilocaine  (EMLA ) cream Apply 1 Application topically. 01/01/24   [provider]  LOKELMA  10 g PACK packet Take 10 g by mouth daily. 04/12/23   [provider]  sevelamer  carbonate (RENVELA ) 800 MG tablet Take 1 tablet (800 mg total) by mouth 3 (three) times daily with meals. 10/27/22   Leotis Bogus, MD  VELPHORO 500 MG chewable tablet Chew 500 mg by mouth 5 (five) times daily. 12/26/23   [provider]                                                                                                                                    Past Surgical History Past Surgical History:  Procedure Laterality Date   AV FISTULA PLACEMENT Left 12/23/2021   Procedure: INSERTION OF LEFT ARM BRACHIOCEPHALIC  ARTERIOVENOUS FISTULA;  Surgeon: Gretta Lonni PARAS, MD;  Location: [facility] OR;  Service: Vascular;  Laterality: Left;   BACK SURGERY     COLONOSCOPY     COLONOSCOPY N/A 01/08/2024   Procedure: COLONOSCOPY;  Surgeon: San Sandor GAILS, DO;  Location: [facility] ENDOSCOPY;  Service: Gastroenterology;  Laterality: N/A;   ESOPHAGOGASTRODUODENOSCOPY N/A 01/08/2024   Procedure: EGD (ESOPHAGOGASTRODUODENOSCOPY);  Surgeon: San Sandor GAILS, DO;  Location: [facility] ENDOSCOPY;  Service: Gastroenterology;  Laterality: N/A;   ESOPHAGOGASTRODUODENOSCOPY N/A 01/13/2024   Procedure: EGD (ESOPHAGOGASTRODUODENOSCOPY);  Surgeon: Legrand Victory LITTIE DOUGLAS, MD;  Location: [facility] ENDOSCOPY;  Service: Gastroenterology;  Laterality: N/A;   ESOPHAGOGASTRODUODENOSCOPY N/A 05/15/2024   Procedure: EGD (ESOPHAGOGASTRODUODENOSCOPY);  Surgeon: Wilhelmenia Aloha Raddle., MD;  Location: [facility];  Service: Gastroenterology;  Laterality: N/A;   HEMOSTASIS CLIP PLACEMENT  01/13/2024   Procedure: CONTROL OF HEMORRHAGE, GI TRACT, ENDOSCOPIC, BY CLIPPING OR OVERSEWING;  Surgeon: Legrand Victory LITTIE DOUGLAS, MD;  Location: [facility] ENDOSCOPY;  Service: Gastroenterology;;   IR FLUORO GUIDE CV LINE RIGHT  12/16/2021   IR US  GUIDE VASC ACCESS RIGHT  12/16/2021   PACEMAKER LEADLESS INSERTION N/A 01/15/2023   Procedure: PACEMAKER LEADLESS INSERTION;  Surgeon: Cindie Ole DASEN, MD;  Location: [facility] INVASIVE CV LAB;  Service: Cardiovascular;  Laterality: N/A;   PERICARDIOCENTESIS N/A 04/12/2023   Procedure: PERICARDIOCENTESIS;  Surgeon: Wendel Lurena POUR, MD;  Location: [facility] INVASIVE CV LAB;  Service: Cardiovascular;  Laterality: N/A;   TEMPORARY PACEMAKER N/A 01/13/2023   Procedure: TEMPORARY PACEMAKER;  Surgeon: Wendel Lurena POUR, MD;  Location: [facility] INVASIVE CV LAB;  Service: Cardiovascular;  Laterality: N/A;   UPPER GASTROINTESTINAL ENDOSCOPY     Family History Family History  Problem Relation Age of Onset   Diabetes Brother    Colon cancer Neg Hx    Esophageal cancer Neg Hx     Stomach cancer Neg Hx    Pancreatic cancer Neg Hx    Inflammatory bowel disease Neg Hx    Liver disease Neg Hx    Rectal cancer Neg Hx    Colon polyps Neg Hx     Social History Social History[1] Allergies Chlorhexidine  and Fish allergy  Review of Systems A thorough review of systems was obtained and all systems are negative except as noted in the HPI and PMH.   Physical Exam Vital Signs  I have reviewed the triage vital signs BP 134/67   Pulse 74   Temp 97.8 F (36.6 C)   Resp 20   Ht 4' 10 (1.473 m)   Wt 70 kg   SpO2 100%   BMI 32.25 kg/m  Physical Exam Vitals and nursing note reviewed.  Constitutional:      General: She is not in acute distress.    Appearance: Normal appearance. She is well-developed. She is obese. She is not ill-appearing.  HENT:     Head: Normocephalic and atraumatic.     Right Ear: External ear normal.     Left Ear: External ear normal.     Nose: Nose normal.     Mouth/Throat:     Mouth: Mucous membranes are moist.  Eyes:     General: No scleral icterus.       Right eye: No discharge.        Left eye: No discharge.  Cardiovascular:     Rate and Rhythm: Normal rate.  Pulmonary:     Effort: Pulmonary effort is normal. Tachypnea present. No respiratory distress.     Breath sounds: No stridor. Decreased breath sounds present.  Abdominal:     General: Abdomen is flat. There is no distension.     Tenderness: There is no guarding.  Musculoskeletal:        General: No deformity.     Cervical back: No rigidity.     Right lower leg: Edema present.     Left lower leg: Edema present.     Comments: Trace b/l LE  Skin:    General: Skin is warm and dry.     Coloration: Skin is not cyanotic, jaundiced or pale.  Neurological:     Mental Status: She  is alert.  Psychiatric:        Speech: Speech normal.        Behavior: Behavior normal. Behavior is cooperative.     ED Results and Treatments Labs (all labs ordered are listed, but only  abnormal results are displayed) Labs Reviewed  BASIC METABOLIC PANEL WITH GFR - Abnormal; Notable for the following components:      Result Value   Potassium 5.6 (*)    Chloride 97 (*)    Glucose, Bld 113 (*)    BUN 37 (*)    Creatinine, Ser 5.62 (*)    Calcium  8.7 (*)    GFR, Estimated 8 (*)    Anion gap 16 (*)    All other components within normal limits  CBC - Abnormal; Notable for the following components:   RBC 2.95 (*)    Hemoglobin 10.2 (*)    HCT 30.2 (*)    MCV 102.4 (*)    MCH 34.6 (*)    Platelets 55 (*)    All other components within normal limits  PRO BRAIN NATRIURETIC PEPTIDE - Abnormal; Notable for the following components:   Pro Brain Natriuretic Peptide 29,860.0 (*)    All other components within normal limits  TROPONIN T, HIGH SENSITIVITY - Abnormal; Notable for the following components:   Troponin T High Sensitivity 97 (*)    All other components within normal limits                                                                                                                          Radiology DG Chest 2 View Result Date: 07/07/2024 EXAM: 2 VIEW(S) XRAY OF THE CHEST 07/07/2024 10:36:00 PM COMPARISON: 04/22/2023 CLINICAL HISTORY: SOB FINDINGS: LUNGS AND PLEURA: Hypoinflated lungs. Prominent interstitial markings. Mild pulmonary edema. Bibasilar atelectasis. No pleural effusion. No pneumothorax. HEART AND MEDIASTINUM: Cardiomegaly, unchanged. Cardiac loop recorder in place. BONES AND SOFT TISSUES: Partially imaged lumbar spinal fusion hardware in place. Degenerative spine changes. No acute osseous abnormality. IMPRESSION: 1. Cardiomegaly, mild pulmonary edema, and bibasilar atelectasis. Electronically signed by: Greig Pique MD 07/07/2024 10:41 PM EST RP Workstation: [id number]    Pertinent labs & imaging results that were available during my care of the patient were reviewed by me and considered in my medical decision making (see MDM for details).  Medications  Ordered in ED Medications  furosemide  (LASIX ) injection 40 mg (has no administration in time range)  sodium zirconium cyclosilicate  (LOKELMA ) packet 10 g (has no administration in time range)  Procedures Procedures  (including critical care time)  Medical Decision Making / ED Course    Medical Decision Making:    Stacey Chambers is a 74 y.o. female  with past medical history as below, significant for ESRD on HD Monday Wednesday Friday, PAF, hypertension cirrhosis anemia who presents to the ED with complaint of dyspnea. The complaint involves an extensive differential diagnosis and also carries with it a high risk of complications and morbidity.  Serious etiology was considered. Ddx includes but is not limited to: In my evaluation of this patient's dyspnea my DDx includes, but is not limited to, pneumonia, pulmonary embolism, pneumothorax, pulmonary edema, metabolic acidosis, asthma, COPD, cardiac cause, anemia, anxiety, etc.    Complete initial physical exam performed, notably the patient was in no acute distress.    Reviewed and confirmed nursing documentation for past medical history, family history, social history.  Vital signs reviewed.    ESRD on HD Dyspnea> - Patient received a very short run of dialysis today.  Last HD was Friday.  Denies any missed sessions over the past 2 weeks other than this morning. - chest X-ray with cardiomegaly, pulmonary edema - she still makes some urine, will give dose of lasix  - K is mild elev, will give lokelma ; no EKG changes - bnp elev c/w ESRD - trop elev x1, get delta. No CP or acute ischemia on EKG. Favor demand ischemia    Clinical Course as of 07/07/24 2322  Mon Jul 07, 2024  2302 Spoke w/ Dr Macel, plan HD tomorrow [SG]    Clinical Course User Index [SG] Elnor Jayson LABOR, DO     Dr Griselda taking  over care at time of shift change                Additional history obtained: -Additional history obtained from na -External records from outside source obtained and reviewed including: Chart review including previous notes, labs, imaging, consultation notes including  Prior labs Prior admission    Lab Tests: -I ordered, reviewed, and interpreted labs.   The pertinent results include:   Labs Reviewed  BASIC METABOLIC PANEL WITH GFR - Abnormal; Notable for the following components:      Result Value   Potassium 5.6 (*)    Chloride 97 (*)    Glucose, Bld 113 (*)    BUN 37 (*)    Creatinine, Ser 5.62 (*)    Calcium  8.7 (*)    GFR, Estimated 8 (*)    Anion gap 16 (*)    All other components within normal limits  CBC - Abnormal; Notable for the following components:   RBC 2.95 (*)    Hemoglobin 10.2 (*)    HCT 30.2 (*)    MCV 102.4 (*)    MCH 34.6 (*)    Platelets 55 (*)    All other components within normal limits  PRO BRAIN NATRIURETIC PEPTIDE - Abnormal; Notable for the following components:   Pro Brain Natriuretic Peptide 29,860.0 (*)    All other components within normal limits  TROPONIN T, HIGH SENSITIVITY - Abnormal; Notable for the following components:   Troponin T High Sensitivity 97 (*)    All other components within normal limits    Notable for as above  EKG   EKG Interpretation Date/Time:  Monday July 07 2024 22:21:38 EST Ventricular Rate:  78 PR Interval:  166 QRS Duration:  128 QT Interval:  436 QTC Calculation: 497 R Axis:   72  Text Interpretation:  Normal sinus rhythm Right bundle branch block Abnormal ECG When compared with ECG of 11-Jan-2024 21:12, PREVIOUS ECG IS PRESENT Confirmed by Elnor Savant ([id number]) on 07/07/2024 10:54:17 PM         Imaging Studies ordered: I ordered imaging studies including CXR I independently visualized the following imaging with scope of interpretation limited to determining acute life threatening  conditions related to emergency care; findings noted above I agree with the radiologist interpretation If any imaging was obtained with contrast I closely monitored patient for any possible adverse reaction a/w contrast administration in the emergency department   Medicines ordered and prescription drug management: Meds ordered this encounter  Medications   furosemide  (LASIX ) injection 40 mg   DISCONTD: Chlorhexidine  Gluconate Cloth 2 % PADS 6 each   sodium zirconium cyclosilicate  (LOKELMA ) packet 10 g    -I have reviewed the patients home medicines and have made adjustments as needed   Consultations Obtained: I requested consultation with the nephro Dr Macel,  and discussed lab and imaging findings as well as pertinent plan - they recommend: HD tomorrow   Cardiac Monitoring: The patient was maintained on a cardiac monitor.  I personally viewed and interpreted the cardiac monitored which showed an underlying rhythm of: NSR Continuous pulse oximetry interpreted by myself, 98% on 2LNC.    Social Determinants of Health:  Diagnosis or treatment significantly limited by social determinants of health: obesity   Reevaluation: After the interventions noted above, I reevaluated the patient and found that they have improved  Co morbidities that complicate the patient evaluation  Past Medical History:  Diagnosis Date   Anemia    Cirrhosis (HCC)    Diabetes mellitus without complication (HCC)    ESRD (end stage renal disease) on dialysis (HCC)    Hypertension    Paroxysmal atrial fibrillation (HCC)    Presence of permanent cardiac pacemaker    Medtronic   Type 2 diabetes mellitus (HCC)    Vision loss       Dispostion: Disposition decision including need for hospitalization was considered, and patient disposition pending at time of sign out.    Final Clinical Impression(s) / ED Diagnoses Final diagnoses:  ESRD on hemodialysis (HCC)  Dyspnea, unspecified type          [1]  Social History Tobacco Use   Smoking status: Never   Smokeless tobacco: Never  Vaping Use   Vaping status: Never Used  Substance Use Topics   Alcohol use: Never    Comment: once in awhile   Drug use: Not Currently    Types: Marijuana    Comment: Use of THC tea 1-2 times per week     Elnor Savant LABOR, DO 07/07/24 2322  "

## 2024-07-07 NOTE — ED Triage Notes (Signed)
 Pt c.o sob, pt missed dialysis today due to the facility losing water .

## 2024-07-07 NOTE — ED Provider Notes (Signed)
 Care assumed at 2300.  Patient with a ESRD on hemodialysis here for evaluation of shortness of breath in setting of being unable to complete her dialysis session secondary to equipment failure at dialysis.  Care assumed pending repeat troponin and dialysis.  Repeat troponin is stable.  Patient is resting comfortably.  Plan to dialyze and reevaluate in the emergency department.  Patient care transferred pending dialysis.   Griselda Norris, MD 07/08/24 [phone]

## 2024-07-08 LAB — TROPONIN T, HIGH SENSITIVITY: Troponin T High Sensitivity: 98 ng/L — ABNORMAL HIGH (ref 0–19)

## 2024-07-08 MED ORDER — ANTICOAGULANT SODIUM CITRATE 4% (200MG/5ML) IV SOLN: 5.0000 mL | Status: DC | PRN

## 2024-07-08 MED ORDER — ACETAMINOPHEN 325 MG PO TABS
650.0000 mg | ORAL_TABLET | Freq: Once | ORAL | Status: AC
  Administered 2024-07-08: 650 mg via ORAL

## 2024-07-08 MED ORDER — HEPARIN SODIUM (PORCINE) 1000 UNIT/ML DIALYSIS: 1000.0000 [IU] | INTRAMUSCULAR | Status: DC | PRN

## 2024-07-08 MED ORDER — LIDOCAINE-PRILOCAINE 2.5-2.5 % EX CREA: 1.0000 | TOPICAL_CREAM | CUTANEOUS | Status: DC | PRN

## 2024-07-08 MED ORDER — LIDOCAINE HCL (PF) 1 % IJ SOLN: 5.0000 mL | INTRAMUSCULAR | Status: DC | PRN

## 2024-07-08 MED ORDER — ACETAMINOPHEN 325 MG PO TABS
ORAL_TABLET | ORAL | Status: AC
  Filled 2024-07-08: qty 2

## 2024-07-08 MED ORDER — ALTEPLASE 2 MG IJ SOLR: 2.0000 mg | Freq: Once | INTRAMUSCULAR | Status: DC | PRN

## 2024-07-08 MED ORDER — PENTAFLUOROPROP-TETRAFLUOROETH EX AERO: 1.0000 | INHALATION_SPRAY | CUTANEOUS | Status: DC | PRN

## 2024-07-08 MED ORDER — HEPARIN SODIUM (PORCINE) 1000 UNIT/ML DIALYSIS
100.0000 [IU]/kg | INTRAMUSCULAR | Status: DC | PRN
  Administered 2024-07-08: 7000 [IU] via INTRAVENOUS_CENTRAL
  Filled 2024-07-08: qty 7

## 2024-07-08 NOTE — Procedures (Signed)
 Asked to see this patient for hospital dialysis. Her home HD unit had a machine malfunction yesterday and could not get HD, so she came to the ED. K+ was 5.6, no vol issues. Exam shows pt euvolemic. The plan will be for ED HD. Pt is not to be admitted at this time. When dialysis is completed pt will be sent back to ED for reassessment.   Vitals:   07/08/24 0930 07/08/24 1000 07/08/24 1030 07/08/24 1100  BP: (!) 159/76 (!) 145/85 (!) 157/72 (!) 144/78  Pulse: 75 79 80 79  Resp: 16 18 16 13   Temp:      TempSrc:      SpO2: 99% 100% 99% 97%  Weight:      Height:           I was present at the procedure, reviewed the HD regimen and made appropriate changes.   Myer Fret MD  [facility] 07/08/2024, 11:18 AM    Recent Labs  Lab 07/07/24 2230  HGB 10.2*  CALCIUM  8.7*  CREATININE 5.62*  K 5.6*   No results for input(s): IRON , TIBC, FERRITIN in the last 168 hours. Inpatient medications:   anticoagulant sodium citrate      alteplase , anticoagulant sodium citrate , heparin , heparin , lidocaine  (PF), lidocaine -prilocaine , pentafluoroprop-tetrafluoroeth

## 2024-07-08 NOTE — Progress Notes (Addendum)
 Pt receives out-pt HD at [facility] on MWF 0605 chair time. Will assist as needed.    Pavle Wiler Renal Navigator [phone]  Addendeum 1:34pm D/c noted. Informed clinic, no further support needed.

## 2024-07-08 NOTE — Progress Notes (Signed)
" °   07/08/24 1220  Vitals  Temp 98.1 F (36.7 C)  Pulse Rate 77  Resp 16  BP (!) 157/68  SpO2 99 %  O2 Device Nasal Cannula  Weight  (pt is in stretcher--unable to weigh)  Type of Weight Post-Dialysis  Oxygen Therapy  O2 Flow Rate (L/min) 2 L/min  Post Treatment  Dialyzer Clearance Lightly streaked  Hemodialysis Intake (mL) 0 mL  Liters Processed 84  Fluid Removed (mL) 2400 mL  Tolerated HD Treatment Yes  AVG/AVF Arterial Site Held (minutes) 5 minutes  AVG/AVF Venous Site Held (minutes) 5 minutes   Received patient in bed to unit.  Alert and oriented.  Informed consent signed and in chart.   TX duration:3.5  Patient tolerated well.  Transported back to the room  Alert, without acute distress.  Hand-off given to patient's nurse.   Access used: LUAF Access issues: no complications  Total UF removed: 2400 Medication(s) given: tylenol  650mg  po x1   [facility] "

## 2024-07-08 NOTE — ED Provider Notes (Signed)
 Patient care transferred to me. Patient is going to dialysis this AM, will need reassessment and likely discharge upon return. Stable for transfer to dialysis.   Freddi Hamilton, MD 07/08/24 [phone]

## 2024-07-08 NOTE — Progress Notes (Signed)
 Brief Nephrology Note  Patient here after machine malfunction at dialysis today. Mild hyperkalemia and some volume excess. Plan for HD this morning then hopefully DC after. Formal consult to follow if she remains inpatient.

## 2024-07-17 ENCOUNTER — Telehealth: Payer: Self-pay | Admitting: Cardiology

## 2024-07-17 NOTE — Telephone Encounter (Signed)
 [employer] calling to report abnormal zio patch results. Please advise.

## 2024-07-17 NOTE — Telephone Encounter (Signed)
"  ° °  Cardiac Monitor Alert  Date of alert:  07/17/2024   Patient Name: Stacey Chambers  DOB: [birth year]  MRN: [id number]   [facility] Cardiologist: Dub Huntsman, DO  [facility] EP:  OLE ONEIDA HOLTS, MD (Inactive)    Monitor Information: Long Term Monitor [ZioXT]  Reason:  a-fib and complete heart block Ordering provider:  Aline Door, PA   Alert 2nd degree AV Block, Type II- 49-53bpm, lasted 5 seconds, pt was symptomatic  This is the 1st alert for this rhythm.   Next Cardiology Appointment   Date:  not scheduled, recall for 9 months  Provider:  Dr. Huntsman  The patient could NOT be reached by telephone today.     Arrhythmia, symptoms and history reviewed with Dr. Jeffrie (DOD.   Plan:  Determined it is likely artifact. Nothing further as this time.  Other: Report has been posted. This event is available on page 5, strip #2.  Azeneth Carbonell L, RN  07/17/2024 4:17 PM   "

## 2024-08-01 ENCOUNTER — Ambulatory Visit: Attending: Family Medicine

## 2024-08-03 ENCOUNTER — Other Ambulatory Visit: Payer: Self-pay | Admitting: Gastroenterology

## 2024-08-04 DIAGNOSIS — I480 Paroxysmal atrial fibrillation: Secondary | ICD-10-CM

## 2024-08-06 ENCOUNTER — Telehealth: Payer: Self-pay | Admitting: Cardiology

## 2024-08-06 NOTE — Telephone Encounter (Signed)
 Patient was called regarding missed remote transmission.

## 2024-08-08 ENCOUNTER — Ambulatory Visit: Payer: Self-pay | Admitting: Student

## 2024-10-31 ENCOUNTER — Ambulatory Visit

## 2025-01-30 ENCOUNTER — Ambulatory Visit

## 2025-05-01 ENCOUNTER — Ambulatory Visit

## 2025-07-31 ENCOUNTER — Ambulatory Visit
